# Patient Record
Sex: Male | Born: 1935 | Race: White | Hispanic: No | Smoking: Former smoker
Health system: Southern US, Community
[De-identification: ages and names within clinical notes are randomized; demographics above are authoritative.]

## PROBLEM LIST (undated history)

## (undated) DIAGNOSIS — C439 Malignant melanoma of skin, unspecified: Secondary | ICD-10-CM

## (undated) DIAGNOSIS — B029 Zoster without complications: Secondary | ICD-10-CM

## (undated) DIAGNOSIS — I639 Cerebral infarction, unspecified: Secondary | ICD-10-CM

## (undated) DIAGNOSIS — R531 Weakness: Secondary | ICD-10-CM

## (undated) DIAGNOSIS — C801 Malignant (primary) neoplasm, unspecified: Secondary | ICD-10-CM

## (undated) DIAGNOSIS — R001 Bradycardia, unspecified: Secondary | ICD-10-CM

## (undated) DIAGNOSIS — G8191 Hemiplegia, unspecified affecting right dominant side: Secondary | ICD-10-CM

## (undated) DIAGNOSIS — K219 Gastro-esophageal reflux disease without esophagitis: Secondary | ICD-10-CM

## (undated) DIAGNOSIS — L570 Actinic keratosis: Secondary | ICD-10-CM

## (undated) DIAGNOSIS — R1310 Dysphagia, unspecified: Secondary | ICD-10-CM

## (undated) DIAGNOSIS — I350 Nonrheumatic aortic (valve) stenosis: Secondary | ICD-10-CM

## (undated) DIAGNOSIS — S72001A Fracture of unspecified part of neck of right femur, initial encounter for closed fracture: Secondary | ICD-10-CM

## (undated) DIAGNOSIS — I480 Paroxysmal atrial fibrillation: Secondary | ICD-10-CM

## (undated) DIAGNOSIS — R233 Spontaneous ecchymoses: Secondary | ICD-10-CM

## (undated) DIAGNOSIS — R269 Unspecified abnormalities of gait and mobility: Secondary | ICD-10-CM

## (undated) DIAGNOSIS — Z460 Encounter for fitting and adjustment of spectacles and contact lenses: Secondary | ICD-10-CM

## (undated) DIAGNOSIS — Z85820 Personal history of malignant melanoma of skin: Secondary | ICD-10-CM

## (undated) DIAGNOSIS — I679 Cerebrovascular disease, unspecified: Secondary | ICD-10-CM

## (undated) DIAGNOSIS — H9191 Unspecified hearing loss, right ear: Secondary | ICD-10-CM

## (undated) DIAGNOSIS — E43 Unspecified severe protein-calorie malnutrition: Secondary | ICD-10-CM

## (undated) DIAGNOSIS — H5440 Blindness, one eye, unspecified eye: Secondary | ICD-10-CM

## (undated) DIAGNOSIS — I739 Peripheral vascular disease, unspecified: Secondary | ICD-10-CM

## (undated) DIAGNOSIS — E785 Hyperlipidemia, unspecified: Secondary | ICD-10-CM

## (undated) DIAGNOSIS — I10 Essential (primary) hypertension: Secondary | ICD-10-CM

## (undated) DIAGNOSIS — R748 Abnormal levels of other serum enzymes: Secondary | ICD-10-CM

## (undated) DIAGNOSIS — I472 Ventricular tachycardia, unspecified: Secondary | ICD-10-CM

## (undated) DIAGNOSIS — D649 Anemia, unspecified: Secondary | ICD-10-CM

## (undated) DIAGNOSIS — K5790 Diverticulosis of intestine, part unspecified, without perforation or abscess without bleeding: Secondary | ICD-10-CM

## (undated) DIAGNOSIS — Z9181 History of falling: Secondary | ICD-10-CM

## (undated) DIAGNOSIS — G4733 Obstructive sleep apnea (adult) (pediatric): Secondary | ICD-10-CM

## (undated) DIAGNOSIS — J449 Chronic obstructive pulmonary disease, unspecified: Secondary | ICD-10-CM

## (undated) DIAGNOSIS — E507 Other ocular manifestations of vitamin A deficiency: Secondary | ICD-10-CM

## (undated) DIAGNOSIS — K8051 Calculus of bile duct without cholangitis or cholecystitis with obstruction: Secondary | ICD-10-CM

## (undated) DIAGNOSIS — Z7901 Long term (current) use of anticoagulants: Secondary | ICD-10-CM

## (undated) DIAGNOSIS — L821 Other seborrheic keratosis: Secondary | ICD-10-CM

## (undated) DIAGNOSIS — M7989 Other specified soft tissue disorders: Secondary | ICD-10-CM

## (undated) DIAGNOSIS — R739 Hyperglycemia, unspecified: Secondary | ICD-10-CM

## (undated) DIAGNOSIS — R238 Other skin changes: Secondary | ICD-10-CM

## (undated) HISTORY — DX: Dysphagia, unspecified: R13.10

## (undated) HISTORY — DX: Spontaneous ecchymoses: R23.3

## (undated) HISTORY — DX: Bradycardia, unspecified: R00.1

## (undated) HISTORY — DX: Hyperlipidemia, unspecified: E78.5

## (undated) HISTORY — DX: Cerebrovascular disease, unspecified: I67.9

## (undated) HISTORY — DX: Actinic keratosis: L57.0

## (undated) HISTORY — DX: Chronic obstructive pulmonary disease, unspecified: J44.9

## (undated) HISTORY — DX: Fracture of unspecified part of neck of right femur, initial encounter for closed fracture: S72.001A

## (undated) HISTORY — DX: Other specified soft tissue disorders: M79.89

## (undated) HISTORY — DX: Nonrheumatic aortic (valve) stenosis: I35.0

## (undated) HISTORY — DX: Essential (primary) hypertension: I10

## (undated) HISTORY — DX: Malignant melanoma of skin, unspecified: C43.9

## (undated) HISTORY — DX: Gastro-esophageal reflux disease without esophagitis: K21.9

## (undated) HISTORY — DX: Long term (current) use of anticoagulants: Z79.01

## (undated) HISTORY — DX: Personal history of malignant melanoma of skin: Z85.820

## (undated) HISTORY — DX: Paroxysmal atrial fibrillation: I48.0

## (undated) HISTORY — DX: Other ocular manifestations of vitamin A deficiency: E50.7

## (undated) HISTORY — DX: Anemia, unspecified: D64.9

## (undated) HISTORY — DX: Other seborrheic keratosis: L82.1

## (undated) HISTORY — DX: Calculus of bile duct without cholangitis or cholecystitis with obstruction: K80.51

## (undated) HISTORY — DX: Encounter for fitting and adjustment of spectacles and contact lenses: Z46.0

## (undated) HISTORY — DX: Other skin changes: R23.8

## (undated) HISTORY — DX: Hemiplegia, unspecified affecting right dominant side: G81.91

## (undated) HISTORY — DX: Ventricular tachycardia: I47.2

## (undated) HISTORY — DX: Peripheral vascular disease, unspecified: I73.9

## (undated) HISTORY — DX: Abnormal levels of other serum enzymes: R74.8

## (undated) HISTORY — DX: Malignant (primary) neoplasm, unspecified: C80.1

## (undated) HISTORY — DX: Weakness: R53.1

## (undated) HISTORY — DX: Zoster without complications: B02.9

## (undated) HISTORY — DX: Unspecified abnormalities of gait and mobility: R26.9

## (undated) HISTORY — DX: History of falling: Z91.81

## (undated) HISTORY — DX: Ventricular tachycardia, unspecified: I47.20

## (undated) HISTORY — PX: MELANOMA EXCISION: SHX5266

## (undated) HISTORY — DX: Obstructive sleep apnea (adult) (pediatric): G47.33

## (undated) HISTORY — DX: Unspecified hearing loss, right ear: H91.91

## (undated) HISTORY — DX: Unspecified severe protein-calorie malnutrition: E43

## (undated) HISTORY — DX: Cerebral infarction, unspecified: I63.9

## (undated) HISTORY — DX: Hyperglycemia, unspecified: R73.9

## (undated) HISTORY — DX: Diverticulosis of intestine, part unspecified, without perforation or abscess without bleeding: K57.90

---

## 1969-08-06 HISTORY — PX: OTHER SURGICAL HISTORY: SHX169

## 1979-08-07 DIAGNOSIS — H5440 Blindness, one eye, unspecified eye: Secondary | ICD-10-CM

## 1979-08-07 HISTORY — DX: Blindness, one eye, unspecified eye: H54.40

## 1981-12-06 HISTORY — PX: HERNIA REPAIR: SHX51

## 1998-04-17 ENCOUNTER — Ambulatory Visit (HOSPITAL_COMMUNITY): Admission: RE | Admit: 1998-04-17 | Discharge: 1998-04-17 | Payer: Self-pay | Admitting: Plastic Surgery

## 1998-07-08 ENCOUNTER — Ambulatory Visit (HOSPITAL_COMMUNITY): Admission: RE | Admit: 1998-07-08 | Discharge: 1998-07-08 | Payer: Self-pay | Admitting: Internal Medicine

## 1998-07-23 ENCOUNTER — Inpatient Hospital Stay (HOSPITAL_COMMUNITY): Admission: EM | Admit: 1998-07-23 | Discharge: 1998-07-29 | Payer: Self-pay | Admitting: Emergency Medicine

## 1998-07-29 ENCOUNTER — Inpatient Hospital Stay (HOSPITAL_COMMUNITY)
Admission: RE | Admit: 1998-07-29 | Discharge: 1998-08-19 | Payer: Self-pay | Admitting: Physical Medicine & Rehabilitation

## 1998-07-31 ENCOUNTER — Encounter: Payer: Self-pay | Admitting: Physical Medicine & Rehabilitation

## 1998-11-05 ENCOUNTER — Ambulatory Visit (HOSPITAL_COMMUNITY): Admission: RE | Admit: 1998-11-05 | Discharge: 1998-11-05 | Payer: Self-pay | Admitting: Internal Medicine

## 1998-11-05 ENCOUNTER — Encounter: Payer: Self-pay | Admitting: Internal Medicine

## 1999-03-25 ENCOUNTER — Ambulatory Visit: Admission: RE | Admit: 1999-03-25 | Discharge: 1999-03-25 | Payer: Self-pay | Admitting: Pulmonary Disease

## 1999-04-13 ENCOUNTER — Encounter: Admission: RE | Admit: 1999-04-13 | Discharge: 1999-06-29 | Payer: Self-pay | Admitting: Internal Medicine

## 1999-11-19 ENCOUNTER — Encounter: Admission: RE | Admit: 1999-11-19 | Discharge: 2000-02-17 | Payer: Self-pay

## 1999-12-23 ENCOUNTER — Inpatient Hospital Stay (HOSPITAL_COMMUNITY): Admission: EM | Admit: 1999-12-23 | Discharge: 1999-12-25 | Payer: Self-pay | Admitting: Emergency Medicine

## 1999-12-24 ENCOUNTER — Encounter: Payer: Self-pay | Admitting: Pulmonary Disease

## 2003-04-24 ENCOUNTER — Ambulatory Visit (HOSPITAL_COMMUNITY): Admission: RE | Admit: 2003-04-24 | Discharge: 2003-04-24 | Payer: Self-pay | Admitting: Gastroenterology

## 2003-04-24 ENCOUNTER — Encounter: Payer: Self-pay | Admitting: Gastroenterology

## 2003-05-29 ENCOUNTER — Encounter: Payer: Self-pay | Admitting: Gastroenterology

## 2003-10-08 ENCOUNTER — Encounter: Admission: RE | Admit: 2003-10-08 | Discharge: 2004-01-06 | Payer: Self-pay | Admitting: Neurology

## 2004-10-16 ENCOUNTER — Ambulatory Visit: Payer: Self-pay | Admitting: Internal Medicine

## 2004-10-28 ENCOUNTER — Ambulatory Visit: Payer: Self-pay | Admitting: *Deleted

## 2004-11-04 ENCOUNTER — Ambulatory Visit: Payer: Self-pay | Admitting: Cardiology

## 2004-11-16 ENCOUNTER — Ambulatory Visit: Payer: Self-pay | Admitting: Internal Medicine

## 2004-11-24 ENCOUNTER — Ambulatory Visit: Payer: Self-pay

## 2004-12-11 ENCOUNTER — Ambulatory Visit: Payer: Self-pay | Admitting: Internal Medicine

## 2004-12-28 ENCOUNTER — Ambulatory Visit: Payer: Self-pay | Admitting: Internal Medicine

## 2005-01-01 ENCOUNTER — Ambulatory Visit: Payer: Self-pay | Admitting: Internal Medicine

## 2005-01-12 ENCOUNTER — Ambulatory Visit: Payer: Self-pay | Admitting: Cardiology

## 2005-02-02 ENCOUNTER — Ambulatory Visit: Payer: Self-pay | Admitting: *Deleted

## 2005-02-22 ENCOUNTER — Emergency Department (HOSPITAL_COMMUNITY): Admission: EM | Admit: 2005-02-22 | Discharge: 2005-02-22 | Payer: Self-pay | Admitting: *Deleted

## 2005-02-25 ENCOUNTER — Ambulatory Visit: Payer: Self-pay | Admitting: Internal Medicine

## 2005-03-02 ENCOUNTER — Ambulatory Visit: Payer: Self-pay | Admitting: Cardiology

## 2005-03-30 ENCOUNTER — Ambulatory Visit: Payer: Self-pay | Admitting: Internal Medicine

## 2005-03-30 ENCOUNTER — Ambulatory Visit: Payer: Self-pay | Admitting: *Deleted

## 2005-04-02 ENCOUNTER — Ambulatory Visit (HOSPITAL_COMMUNITY): Admission: RE | Admit: 2005-04-02 | Discharge: 2005-04-02 | Payer: Self-pay | Admitting: Internal Medicine

## 2005-04-27 ENCOUNTER — Ambulatory Visit: Payer: Self-pay | Admitting: Internal Medicine

## 2005-05-11 ENCOUNTER — Ambulatory Visit: Payer: Self-pay | Admitting: Internal Medicine

## 2005-05-17 ENCOUNTER — Ambulatory Visit: Payer: Self-pay | Admitting: Internal Medicine

## 2005-05-25 ENCOUNTER — Ambulatory Visit: Payer: Self-pay

## 2005-05-25 ENCOUNTER — Ambulatory Visit: Payer: Self-pay | Admitting: Cardiology

## 2005-06-22 ENCOUNTER — Ambulatory Visit: Payer: Self-pay | Admitting: Cardiology

## 2005-07-02 ENCOUNTER — Ambulatory Visit: Payer: Self-pay | Admitting: Internal Medicine

## 2005-07-20 ENCOUNTER — Ambulatory Visit: Payer: Self-pay | Admitting: Cardiology

## 2005-08-17 ENCOUNTER — Ambulatory Visit: Payer: Self-pay | Admitting: *Deleted

## 2005-09-14 ENCOUNTER — Ambulatory Visit: Payer: Self-pay | Admitting: Cardiovascular Disease

## 2005-09-16 ENCOUNTER — Ambulatory Visit: Payer: Self-pay | Admitting: Internal Medicine

## 2005-10-05 ENCOUNTER — Ambulatory Visit: Payer: Self-pay | Admitting: Internal Medicine

## 2005-10-12 ENCOUNTER — Ambulatory Visit: Payer: Self-pay | Admitting: Cardiology

## 2005-11-09 ENCOUNTER — Ambulatory Visit: Payer: Self-pay | Admitting: Cardiology

## 2005-12-02 ENCOUNTER — Ambulatory Visit: Payer: Self-pay | Admitting: Internal Medicine

## 2005-12-07 ENCOUNTER — Ambulatory Visit: Payer: Self-pay | Admitting: *Deleted

## 2006-01-04 ENCOUNTER — Ambulatory Visit: Payer: Self-pay | Admitting: Cardiology

## 2006-02-01 ENCOUNTER — Ambulatory Visit: Payer: Self-pay | Admitting: *Deleted

## 2006-02-02 ENCOUNTER — Ambulatory Visit: Payer: Self-pay | Admitting: Internal Medicine

## 2006-03-01 ENCOUNTER — Ambulatory Visit: Payer: Self-pay | Admitting: Cardiology

## 2006-03-29 ENCOUNTER — Ambulatory Visit: Payer: Self-pay | Admitting: Cardiovascular Disease

## 2006-04-01 ENCOUNTER — Ambulatory Visit: Payer: Self-pay | Admitting: Internal Medicine

## 2006-04-26 ENCOUNTER — Ambulatory Visit: Payer: Self-pay | Admitting: *Deleted

## 2006-05-24 ENCOUNTER — Ambulatory Visit: Payer: Self-pay | Admitting: Cardiology

## 2006-06-09 ENCOUNTER — Ambulatory Visit: Payer: Self-pay | Admitting: Cardiology

## 2006-06-30 ENCOUNTER — Ambulatory Visit: Payer: Self-pay | Admitting: Internal Medicine

## 2006-07-14 ENCOUNTER — Ambulatory Visit: Payer: Self-pay | Admitting: Cardiology

## 2006-07-28 ENCOUNTER — Ambulatory Visit: Payer: Self-pay | Admitting: Cardiology

## 2006-08-18 ENCOUNTER — Ambulatory Visit: Payer: Self-pay | Admitting: Cardiology

## 2006-09-15 ENCOUNTER — Ambulatory Visit: Payer: Self-pay | Admitting: Cardiology

## 2006-09-29 ENCOUNTER — Ambulatory Visit: Payer: Self-pay | Admitting: Cardiology

## 2006-10-20 ENCOUNTER — Ambulatory Visit: Payer: Self-pay | Admitting: Cardiology

## 2006-11-04 ENCOUNTER — Ambulatory Visit: Payer: Self-pay | Admitting: Internal Medicine

## 2006-11-16 ENCOUNTER — Ambulatory Visit: Payer: Self-pay | Admitting: Internal Medicine

## 2006-11-17 ENCOUNTER — Ambulatory Visit: Payer: Self-pay | Admitting: Cardiology

## 2006-11-22 ENCOUNTER — Ambulatory Visit: Payer: Self-pay | Admitting: Internal Medicine

## 2006-11-22 ENCOUNTER — Ambulatory Visit: Payer: Self-pay | Admitting: Cardiology

## 2006-11-28 ENCOUNTER — Ambulatory Visit: Payer: Self-pay | Admitting: Adult Health

## 2006-12-02 ENCOUNTER — Ambulatory Visit: Payer: Self-pay | Admitting: Cardiology

## 2006-12-13 ENCOUNTER — Ambulatory Visit: Payer: Self-pay | Admitting: Cardiology

## 2006-12-27 ENCOUNTER — Ambulatory Visit: Payer: Self-pay | Admitting: *Deleted

## 2006-12-27 ENCOUNTER — Ambulatory Visit: Payer: Self-pay | Admitting: Internal Medicine

## 2007-01-03 ENCOUNTER — Ambulatory Visit: Payer: Self-pay | Admitting: Cardiology

## 2007-01-09 ENCOUNTER — Ambulatory Visit: Payer: Self-pay | Admitting: Internal Medicine

## 2007-01-09 LAB — CONVERTED CEMR LAB: Sed Rate: 20 mm/hr (ref 0–20)

## 2007-01-11 ENCOUNTER — Ambulatory Visit: Payer: Self-pay | Admitting: Cardiology

## 2007-01-25 ENCOUNTER — Ambulatory Visit (HOSPITAL_COMMUNITY): Admission: RE | Admit: 2007-01-25 | Discharge: 2007-01-25 | Payer: Self-pay | Admitting: Surgery

## 2007-01-25 ENCOUNTER — Encounter (INDEPENDENT_AMBULATORY_CARE_PROVIDER_SITE_OTHER): Payer: Self-pay | Admitting: Specialist

## 2007-02-01 ENCOUNTER — Ambulatory Visit: Payer: Self-pay | Admitting: Internal Medicine

## 2007-02-08 ENCOUNTER — Ambulatory Visit: Payer: Self-pay | Admitting: *Deleted

## 2007-02-10 ENCOUNTER — Ambulatory Visit: Payer: Self-pay | Admitting: Internal Medicine

## 2007-02-22 ENCOUNTER — Ambulatory Visit: Payer: Self-pay | Admitting: Cardiovascular Disease

## 2007-03-06 ENCOUNTER — Ambulatory Visit: Payer: Self-pay | Admitting: Internal Medicine

## 2007-03-15 ENCOUNTER — Ambulatory Visit: Payer: Self-pay | Admitting: Internal Medicine

## 2007-04-12 ENCOUNTER — Ambulatory Visit: Payer: Self-pay | Admitting: *Deleted

## 2007-04-25 ENCOUNTER — Ambulatory Visit: Payer: Self-pay | Admitting: Internal Medicine

## 2007-04-26 ENCOUNTER — Ambulatory Visit: Payer: Self-pay | Admitting: Internal Medicine

## 2007-04-26 LAB — CONVERTED CEMR LAB
Basophils Absolute: 0 10*3/uL (ref 0.0–0.1)
Bilirubin, Direct: 0.1 mg/dL (ref 0.0–0.3)
CO2: 30 meq/L (ref 19–32)
CRP, High Sensitivity: 1 (ref 0.00–5.00)
Calcium: 8.9 mg/dL (ref 8.4–10.5)
Chloride: 105 meq/L (ref 96–112)
Glucose, Bld: 93 mg/dL (ref 70–99)
Hemoglobin: 14.3 g/dL (ref 13.0–17.0)
MCHC: 34.4 g/dL (ref 30.0–36.0)
Monocytes Relative: 6.9 % (ref 3.0–11.0)
Potassium: 5.2 meq/L — ABNORMAL HIGH (ref 3.5–5.1)
RBC: 4.56 M/uL (ref 4.22–5.81)
Sed Rate: 21 mm/hr — ABNORMAL HIGH (ref 0–20)
Sodium: 140 meq/L (ref 135–145)
TSH: 1.07 microintl units/mL (ref 0.35–5.50)
Total CHOL/HDL Ratio: 6.3
Triglycerides: 228 mg/dL (ref 0–149)
VLDL: 46 mg/dL — ABNORMAL HIGH (ref 0–40)

## 2007-05-10 ENCOUNTER — Ambulatory Visit: Payer: Self-pay | Admitting: Cardiology

## 2007-05-22 ENCOUNTER — Ambulatory Visit: Payer: Self-pay | Admitting: Internal Medicine

## 2007-08-21 ENCOUNTER — Ambulatory Visit: Payer: Self-pay | Admitting: Internal Medicine

## 2007-09-18 ENCOUNTER — Ambulatory Visit: Payer: Self-pay | Admitting: Internal Medicine

## 2007-09-21 DIAGNOSIS — R05 Cough: Secondary | ICD-10-CM

## 2007-09-21 DIAGNOSIS — I10 Essential (primary) hypertension: Secondary | ICD-10-CM

## 2007-09-21 DIAGNOSIS — K5730 Diverticulosis of large intestine without perforation or abscess without bleeding: Secondary | ICD-10-CM | POA: Insufficient documentation

## 2007-09-21 DIAGNOSIS — I495 Sick sinus syndrome: Secondary | ICD-10-CM

## 2007-09-21 DIAGNOSIS — H5440 Blindness, one eye, unspecified eye: Secondary | ICD-10-CM

## 2007-09-21 DIAGNOSIS — I739 Peripheral vascular disease, unspecified: Secondary | ICD-10-CM

## 2007-09-21 DIAGNOSIS — E785 Hyperlipidemia, unspecified: Secondary | ICD-10-CM

## 2007-11-20 ENCOUNTER — Ambulatory Visit: Payer: Self-pay | Admitting: Internal Medicine

## 2007-11-20 LAB — CONVERTED CEMR LAB
CO2: 31 meq/L (ref 19–32)
Chloride: 100 meq/L (ref 96–112)
GFR calc non Af Amer: 67 mL/min
Glucose, Bld: 83 mg/dL (ref 70–99)
Sodium: 137 meq/L (ref 135–145)

## 2007-12-07 DIAGNOSIS — C439 Malignant melanoma of skin, unspecified: Secondary | ICD-10-CM

## 2007-12-07 HISTORY — DX: Malignant melanoma of skin, unspecified: C43.9

## 2007-12-07 HISTORY — PX: OTHER SURGICAL HISTORY: SHX169

## 2007-12-11 ENCOUNTER — Encounter: Payer: Self-pay | Admitting: Internal Medicine

## 2008-03-19 ENCOUNTER — Ambulatory Visit: Payer: Self-pay | Admitting: Internal Medicine

## 2008-03-19 DIAGNOSIS — J209 Acute bronchitis, unspecified: Secondary | ICD-10-CM

## 2008-04-19 ENCOUNTER — Ambulatory Visit: Payer: Self-pay | Admitting: Internal Medicine

## 2008-04-19 DIAGNOSIS — Z85820 Personal history of malignant melanoma of skin: Secondary | ICD-10-CM

## 2008-04-22 LAB — CONVERTED CEMR LAB
CO2: 30 meq/L (ref 19–32)
Chloride: 105 meq/L (ref 96–112)
Creatinine, Ser: 1.2 mg/dL (ref 0.4–1.5)
GFR calc Af Amer: 74 mL/min
GFR calc non Af Amer: 61 mL/min
Potassium: 4.8 meq/L (ref 3.5–5.1)
Sodium: 137 meq/L (ref 135–145)

## 2008-04-23 ENCOUNTER — Encounter: Payer: Self-pay | Admitting: Internal Medicine

## 2008-06-26 ENCOUNTER — Ambulatory Visit (HOSPITAL_COMMUNITY): Admission: RE | Admit: 2008-06-26 | Discharge: 2008-06-26 | Payer: Self-pay | Admitting: Surgery

## 2008-07-24 ENCOUNTER — Ambulatory Visit: Payer: Self-pay | Admitting: Cardiology

## 2008-08-13 ENCOUNTER — Telehealth (INDEPENDENT_AMBULATORY_CARE_PROVIDER_SITE_OTHER): Payer: Self-pay | Admitting: *Deleted

## 2008-08-14 ENCOUNTER — Telehealth: Payer: Self-pay | Admitting: Gastroenterology

## 2008-08-14 DIAGNOSIS — R197 Diarrhea, unspecified: Secondary | ICD-10-CM

## 2008-08-19 ENCOUNTER — Ambulatory Visit: Payer: Self-pay | Admitting: Gastroenterology

## 2008-08-19 DIAGNOSIS — K219 Gastro-esophageal reflux disease without esophagitis: Secondary | ICD-10-CM

## 2008-08-19 DIAGNOSIS — R197 Diarrhea, unspecified: Secondary | ICD-10-CM

## 2008-08-26 ENCOUNTER — Ambulatory Visit: Payer: Self-pay | Admitting: Internal Medicine

## 2008-08-26 DIAGNOSIS — R609 Edema, unspecified: Secondary | ICD-10-CM | POA: Insufficient documentation

## 2008-08-29 ENCOUNTER — Telehealth (INDEPENDENT_AMBULATORY_CARE_PROVIDER_SITE_OTHER): Payer: Self-pay | Admitting: *Deleted

## 2008-08-29 LAB — CONVERTED CEMR LAB
BUN: 31 mg/dL — ABNORMAL HIGH (ref 6–23)
CO2: 25 meq/L (ref 19–32)
Calcium: 8.8 mg/dL (ref 8.4–10.5)
Chloride: 111 meq/L (ref 96–112)
Creatinine, Ser: 1.4 mg/dL (ref 0.4–1.5)
GFR calc non Af Amer: 51 mL/min
Potassium: 4.9 meq/L (ref 3.5–5.1)
Sodium: 140 meq/L (ref 135–145)
Total Bilirubin: 0.6 mg/dL (ref 0.3–1.2)

## 2008-09-05 ENCOUNTER — Encounter (INDEPENDENT_AMBULATORY_CARE_PROVIDER_SITE_OTHER): Payer: Self-pay | Admitting: Surgery

## 2008-09-05 ENCOUNTER — Inpatient Hospital Stay (HOSPITAL_COMMUNITY): Admission: RE | Admit: 2008-09-05 | Discharge: 2008-09-06 | Payer: Self-pay | Admitting: Surgery

## 2008-09-16 ENCOUNTER — Encounter: Payer: Self-pay | Admitting: Internal Medicine

## 2008-09-19 ENCOUNTER — Encounter: Payer: Self-pay | Admitting: Internal Medicine

## 2008-09-25 ENCOUNTER — Encounter: Payer: Self-pay | Admitting: Internal Medicine

## 2008-10-01 ENCOUNTER — Encounter: Payer: Self-pay | Admitting: Internal Medicine

## 2008-10-14 ENCOUNTER — Ambulatory Visit: Payer: Self-pay | Admitting: Internal Medicine

## 2008-10-14 LAB — CONVERTED CEMR LAB
ALT: 16 units/L (ref 0–53)
AST: 18 units/L (ref 0–37)
Alkaline Phosphatase: 60 units/L (ref 39–117)
CO2: 27 meq/L (ref 19–32)
Creatinine, Ser: 1.4 mg/dL (ref 0.4–1.5)
GFR calc Af Amer: 62 mL/min
GFR calc non Af Amer: 51 mL/min
Glucose, Bld: 97 mg/dL (ref 70–99)

## 2008-10-23 ENCOUNTER — Encounter: Payer: Self-pay | Admitting: Internal Medicine

## 2008-10-28 ENCOUNTER — Telehealth (INDEPENDENT_AMBULATORY_CARE_PROVIDER_SITE_OTHER): Payer: Self-pay | Admitting: *Deleted

## 2009-04-01 ENCOUNTER — Encounter: Payer: Self-pay | Admitting: Cardiology

## 2009-05-06 ENCOUNTER — Encounter: Payer: Self-pay | Admitting: *Deleted

## 2009-05-20 ENCOUNTER — Encounter: Payer: Self-pay | Admitting: Internal Medicine

## 2009-05-20 ENCOUNTER — Encounter (INDEPENDENT_AMBULATORY_CARE_PROVIDER_SITE_OTHER): Payer: Self-pay | Admitting: *Deleted

## 2009-06-11 ENCOUNTER — Encounter: Payer: Self-pay | Admitting: *Deleted

## 2009-06-17 ENCOUNTER — Encounter: Payer: Self-pay | Admitting: Cardiology

## 2009-06-17 ENCOUNTER — Encounter (INDEPENDENT_AMBULATORY_CARE_PROVIDER_SITE_OTHER): Payer: Self-pay | Admitting: Cardiology

## 2009-07-01 ENCOUNTER — Encounter: Payer: Self-pay | Admitting: Internal Medicine

## 2009-07-01 ENCOUNTER — Encounter: Payer: Self-pay | Admitting: Cardiology

## 2009-07-01 LAB — CONVERTED CEMR LAB: POC INR: 2.8

## 2009-07-22 ENCOUNTER — Encounter: Payer: Self-pay | Admitting: Cardiology

## 2009-07-22 ENCOUNTER — Encounter: Payer: Self-pay | Admitting: Cardiovascular Disease

## 2009-08-12 ENCOUNTER — Telehealth (INDEPENDENT_AMBULATORY_CARE_PROVIDER_SITE_OTHER): Payer: Self-pay | Admitting: *Deleted

## 2009-08-19 ENCOUNTER — Encounter: Payer: Self-pay | Admitting: Cardiology

## 2009-08-19 ENCOUNTER — Encounter (INDEPENDENT_AMBULATORY_CARE_PROVIDER_SITE_OTHER): Payer: Self-pay | Admitting: Cardiology

## 2009-08-19 LAB — CONVERTED CEMR LAB: POC INR: 4

## 2009-09-02 ENCOUNTER — Encounter: Payer: Self-pay | Admitting: Cardiology

## 2009-09-23 ENCOUNTER — Encounter: Payer: Self-pay | Admitting: Cardiology

## 2009-10-14 ENCOUNTER — Encounter: Payer: Self-pay | Admitting: Cardiology

## 2009-11-07 ENCOUNTER — Telehealth: Payer: Self-pay | Admitting: Internal Medicine

## 2009-11-11 ENCOUNTER — Encounter: Payer: Self-pay | Admitting: Cardiovascular Disease

## 2009-11-11 ENCOUNTER — Encounter (INDEPENDENT_AMBULATORY_CARE_PROVIDER_SITE_OTHER): Payer: Self-pay | Admitting: Cardiology

## 2009-11-11 LAB — CONVERTED CEMR LAB: POC INR: 2.6

## 2009-11-12 ENCOUNTER — Ambulatory Visit: Payer: Self-pay | Admitting: Internal Medicine

## 2009-11-13 ENCOUNTER — Telehealth: Payer: Self-pay | Admitting: Internal Medicine

## 2009-11-17 LAB — CONVERTED CEMR LAB
ALT: 18 units/L (ref 0–53)
AST: 19 units/L (ref 0–37)
Alkaline Phosphatase: 72 units/L (ref 39–117)
BUN: 27 mg/dL — ABNORMAL HIGH (ref 6–23)
Basophils Absolute: 0 10*3/uL (ref 0.0–0.1)
Bilirubin, Direct: 0 mg/dL (ref 0.0–0.3)
Calcium: 8.9 mg/dL (ref 8.4–10.5)
Chloride: 105 meq/L (ref 96–112)
Lymphs Abs: 2.1 10*3/uL (ref 0.7–4.0)
MCHC: 33.4 g/dL (ref 30.0–36.0)
Monocytes Relative: 10.6 % (ref 3.0–12.0)
Platelets: 290 10*3/uL (ref 150.0–400.0)
RDW: 12.2 % (ref 11.5–14.6)
Sodium: 141 meq/L (ref 135–145)
Total Bilirubin: 0.8 mg/dL (ref 0.3–1.2)
Total Protein: 6.8 g/dL (ref 6.0–8.3)
WBC: 7.6 10*3/uL (ref 4.5–10.5)

## 2009-12-09 ENCOUNTER — Encounter: Payer: Self-pay | Admitting: Cardiology

## 2009-12-10 ENCOUNTER — Encounter: Payer: Self-pay | Admitting: Cardiology

## 2010-01-06 ENCOUNTER — Encounter: Payer: Self-pay | Admitting: Internal Medicine

## 2010-02-03 ENCOUNTER — Encounter: Payer: Self-pay | Admitting: Cardiovascular Disease

## 2010-02-03 LAB — CONVERTED CEMR LAB: Prothrombin Time: 22.1 s

## 2010-02-10 ENCOUNTER — Encounter: Payer: Self-pay | Admitting: Cardiology

## 2010-03-02 ENCOUNTER — Ambulatory Visit: Payer: Self-pay | Admitting: Internal Medicine

## 2010-03-02 ENCOUNTER — Telehealth: Payer: Self-pay | Admitting: Internal Medicine

## 2010-03-02 DIAGNOSIS — R635 Abnormal weight gain: Secondary | ICD-10-CM

## 2010-03-02 DIAGNOSIS — J019 Acute sinusitis, unspecified: Secondary | ICD-10-CM

## 2010-03-10 ENCOUNTER — Encounter: Payer: Self-pay | Admitting: Internal Medicine

## 2010-03-13 ENCOUNTER — Telehealth (INDEPENDENT_AMBULATORY_CARE_PROVIDER_SITE_OTHER): Payer: Self-pay | Admitting: *Deleted

## 2010-03-16 ENCOUNTER — Encounter: Payer: Self-pay | Admitting: Internal Medicine

## 2010-03-31 ENCOUNTER — Encounter: Payer: Self-pay | Admitting: Cardiovascular Disease

## 2010-03-31 LAB — CONVERTED CEMR LAB: POC INR: 2

## 2010-04-14 ENCOUNTER — Encounter: Payer: Self-pay | Admitting: Internal Medicine

## 2010-04-21 ENCOUNTER — Encounter: Payer: Self-pay | Admitting: Internal Medicine

## 2010-04-21 LAB — CONVERTED CEMR LAB: Prothrombin Time: 23.5 s

## 2010-05-06 ENCOUNTER — Encounter: Payer: Self-pay | Admitting: Internal Medicine

## 2010-05-13 ENCOUNTER — Ambulatory Visit: Payer: Self-pay | Admitting: Internal Medicine

## 2010-05-15 ENCOUNTER — Encounter: Payer: Self-pay | Admitting: Internal Medicine

## 2010-05-19 ENCOUNTER — Encounter: Payer: Self-pay | Admitting: Cardiovascular Disease

## 2010-05-19 LAB — CONVERTED CEMR LAB: POC INR: 3.1

## 2010-06-02 ENCOUNTER — Ambulatory Visit: Payer: Self-pay | Admitting: Internal Medicine

## 2010-06-03 LAB — CONVERTED CEMR LAB
BUN: 31 mg/dL — ABNORMAL HIGH (ref 6–23)
Basophils Relative: 0.2 % (ref 0.0–3.0)
CO2: 28 meq/L (ref 19–32)
Calcium: 8.6 mg/dL (ref 8.4–10.5)
Chloride: 108 meq/L (ref 96–112)
Creatinine, Ser: 1.4 mg/dL (ref 0.4–1.5)
Eosinophils Relative: 2.1 % (ref 0.0–5.0)
GFR calc non Af Amer: 49.52 mL/min (ref >60)
Glucose, Bld: 113 mg/dL — ABNORMAL HIGH (ref 70–99)
MCV: 92.9 fL (ref 78.0–100.0)
Monocytes Absolute: 0.6 10*3/uL (ref 0.1–1.0)
Platelets: 288 10*3/uL (ref 150.0–400.0)
Potassium: 4.9 meq/L (ref 3.5–5.1)
Sodium: 141 meq/L (ref 135–145)

## 2010-06-10 ENCOUNTER — Encounter: Payer: Self-pay | Admitting: Internal Medicine

## 2010-06-16 ENCOUNTER — Encounter: Payer: Self-pay | Admitting: Cardiovascular Disease

## 2010-06-16 LAB — CONVERTED CEMR LAB: POC INR: 3.1

## 2010-07-14 ENCOUNTER — Encounter: Payer: Self-pay | Admitting: Cardiology

## 2010-07-21 ENCOUNTER — Telehealth (INDEPENDENT_AMBULATORY_CARE_PROVIDER_SITE_OTHER): Payer: Self-pay | Admitting: *Deleted

## 2010-07-22 ENCOUNTER — Ambulatory Visit: Payer: Self-pay | Admitting: Internal Medicine

## 2010-07-22 DIAGNOSIS — IMO0002 Reserved for concepts with insufficient information to code with codable children: Secondary | ICD-10-CM

## 2010-07-28 ENCOUNTER — Telehealth (INDEPENDENT_AMBULATORY_CARE_PROVIDER_SITE_OTHER): Payer: Self-pay | Admitting: *Deleted

## 2010-07-29 ENCOUNTER — Encounter: Payer: Self-pay | Admitting: Adult Health

## 2010-07-29 ENCOUNTER — Ambulatory Visit: Payer: Self-pay | Admitting: Internal Medicine

## 2010-08-04 ENCOUNTER — Encounter: Payer: Self-pay | Admitting: Cardiovascular Disease

## 2010-08-12 ENCOUNTER — Ambulatory Visit: Payer: Self-pay | Admitting: Pulmonary Disease

## 2010-08-13 ENCOUNTER — Telehealth (INDEPENDENT_AMBULATORY_CARE_PROVIDER_SITE_OTHER): Payer: Self-pay | Admitting: *Deleted

## 2010-08-13 DIAGNOSIS — M25569 Pain in unspecified knee: Secondary | ICD-10-CM

## 2010-08-14 ENCOUNTER — Encounter: Payer: Self-pay | Admitting: Internal Medicine

## 2010-08-18 ENCOUNTER — Encounter: Payer: Self-pay | Admitting: Cardiovascular Disease

## 2010-08-18 LAB — CONVERTED CEMR LAB: POC INR: 2.7

## 2010-08-25 ENCOUNTER — Ambulatory Visit: Payer: Self-pay | Admitting: Internal Medicine

## 2010-08-31 ENCOUNTER — Encounter: Payer: Self-pay | Admitting: Cardiovascular Disease

## 2010-09-07 ENCOUNTER — Encounter: Payer: Self-pay | Admitting: Internal Medicine

## 2010-09-15 ENCOUNTER — Encounter: Payer: Self-pay | Admitting: Cardiology

## 2010-09-15 ENCOUNTER — Encounter: Payer: Self-pay | Admitting: Internal Medicine

## 2010-10-05 ENCOUNTER — Encounter: Payer: Self-pay | Admitting: Cardiology

## 2010-10-14 ENCOUNTER — Encounter: Payer: Self-pay | Admitting: Internal Medicine

## 2010-10-26 ENCOUNTER — Encounter: Payer: Self-pay | Admitting: Cardiovascular Disease

## 2010-10-26 LAB — CONVERTED CEMR LAB: Prothrombin Time: 33.7 s

## 2010-10-28 ENCOUNTER — Ambulatory Visit: Payer: Self-pay | Admitting: Internal Medicine

## 2010-10-28 DIAGNOSIS — N62 Hypertrophy of breast: Secondary | ICD-10-CM

## 2010-11-09 ENCOUNTER — Encounter: Payer: Self-pay | Admitting: Internal Medicine

## 2010-11-23 ENCOUNTER — Encounter: Payer: Self-pay | Admitting: Internal Medicine

## 2010-11-23 LAB — CONVERTED CEMR LAB: Prothrombin Time: 26.3 s

## 2010-12-02 ENCOUNTER — Ambulatory Visit: Payer: Self-pay | Admitting: Internal Medicine

## 2010-12-06 HISTORY — PX: JOINT REPLACEMENT: SHX530

## 2010-12-21 ENCOUNTER — Encounter: Payer: Self-pay | Admitting: Internal Medicine

## 2010-12-21 ENCOUNTER — Encounter: Payer: Self-pay | Admitting: Cardiology

## 2010-12-30 ENCOUNTER — Encounter: Payer: Self-pay | Admitting: Internal Medicine

## 2011-01-03 LAB — CONVERTED CEMR LAB
POC INR: 2.4
Prothrombin Time: 23.8 s

## 2011-01-04 ENCOUNTER — Encounter: Payer: Self-pay | Admitting: Cardiology

## 2011-01-04 LAB — CONVERTED CEMR LAB: Prothrombin Time: 23.9 s

## 2011-01-05 NOTE — Medication Information (Signed)
Summary: Coumadin Clinic  Anticoagulant Therapy  Managed by: Weston Brass, PharmD Referring MD: [name] PCP: Nolon Lennert MD: Daleen Squibb MD, Maisie Fus Indication 1: CVA-stroke (ICD-436) Lab Used: [facility] [place] Site: [address] INR POC 3.2 INR RANGE 2 - 3  Dietary changes: no    Health status changes: no    Bleeding/hemorrhagic complications: no    Recent/future hospitalizations: no    Any changes in medication regimen? no    Recent/future dental: no  Any missed doses?: no       Is patient compliant with meds? yes       Allergies: No Known Drug Allergies  Anticoagulation Management History:      His anticoagulation is being managed by telephone today.  Positive risk factors for bleeding include an age of 75 years or older.  The bleeding index is 'intermediate risk'.  Positive CHADS2 values include History of HTN and Age > 43 years old.  The start date was 07/06/1998.  Anticoagulation responsible provider: Daleen Squibb MD, Maisie Fus.  INR POC: 3.2.    Anticoagulation Management Assessment/Plan:      The patient's current anticoagulation dose is Coumadin 3 mg tabs: take as directed.  The target INR is 2 - 3.  The next INR is due 08/04/2010.  Anticoagulation instructions were given to patient.  Results were reviewed/authorized by Weston Brass, PharmD.  He was notified by Weston Brass PharmD.         Prior Anticoagulation Instructions: LMOM to call for dose instructions. Bethena Midget, RN, BSN  June 16, 2010 12:16 PM  INR 3.1 Pt to take 3mg s tomorrow then resume 3mg s daily except 6mg s on Wed and Fri. Recheck in 4 weeks. Orders sent to [facility].   Current Anticoagulation Instructions: INR 3.2  Spoke with pt.  Decrease dose to 3mg  every day except 6mg  on Friday. Recheck INR in 3 weeks.  Orders faxed to [facility].

## 2011-01-05 NOTE — Assessment & Plan Note (Signed)
Summary: Primary svc/ try off spirnolactone   Primary Provider/Referring Provider:  [name]  CC:  Left leg pain- the same.  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  He has been maintained on Coumadin since then and mainly complains of intermittent dizziness dating all the way back to his stroke which is controlled on p.r.n. meclizine.  He is followed here also for hypertension and chronic leg swelling. l> r  seen 04/19/08 for fu ok except ? recurrent melanoma left leg  August 26, 2008  cleared for melanoma surgery by Cards   October 14, 2008 ov p skin graft with weaping clear fluid worse as day goes on. no fever or pain or erythema of wound or purulent drainage cp or orhtopnea/ pnd/cp/sob.   May 13, 2010 ov  cc cough off and on for 5 weeks  with white sputum.  He states that he has noticed wheezing for the past few days.  some nasal congestion and sneezing.   swallowing worse lately esp mucinex dm. rec change zantac bedtime, protonix before bfast and diet.  June 02, 2010 cc cough and chest congestion has resolved.  Still has occ cough that his non prod.  He c/o not being able to move his left thumb intermittent cramps up.  Pt is currently on cephalexin 500 mg  for bacterial infection per Tafeen.    August 14 th fell and tore skin R Elbow  July 22, 2010 ov NH nurse concerned that developing cellulitis at abrasion site/ right forearm.  no fever or increase pain, minimal swelling.  --  July 29, 2010--Presents for follow up for right arm cellulitis.  Was seen 1 week ago, tx for for abrasion right forearm, was given keflex - finished this morning.  states no better and has increased warmth x2days. The skin is very thin, nurses are apply a clear dressing but removing it pulls at skin. no fever, confusion, or increased drainage.    August 12, 2010-  cc slow to heal right arm cellulitis, wound cx showed ENTEROBACTER  CLOACAE. Tx w/ Levaquin. Returns today wound is much improved. w/ healed sore and redness/swelling is almost gone. Yesterday hit left elbow w/ small abrasion. no significant redness. complains that his left knee has been hurting . no swelling or redness. Has not used any pain meds. Finish Levaquin.   Alternate ice and Warm heat to knee Tylenol arthritis as needed for pain- go by the bottle instructions.  Elevate and rest  October 28, 2010 ov cc leg pain no change, not using meloxicam as per instructions , also now c/o gynecomastia and tenderness gradually worse x 2 months= bilaterally while on spironoctone chronically which has controlled his edema and tendency to hbp well. no nipple discharge or pain.  Pt denies any significant sore throat, dysphagia, itching, sneezing,  nasal congestion or excess secretions,  fever, chills, sweats, unintended wt loss, pleuritic or exertional cp, hempoptysis, change in activity tolerance  orthopnea pnd . Pt also denies any obvious fluctuation in symptoms with weather or environmental change or other alleviating or aggravating factors.          Current Medications (verified): 1)  Coumadin 3 Mg Tabs (Warfarin Sodium) .... Take As Directed 2)  Zantac 150 Mg  Tabs (Ranitidine Hcl) .... 2 At Bedtime 3)  Prilosec Otc 20 Mg Tbec (Omeprazole Magnesium) .... Take 1 Capsule 30-60 Minutes Before First Meal of The Day. 4)  Spironolactone-Hctz  25-25 Mg Tabs (Spironolactone-Hctz) .... 2 Once Daily 5)  Meclizine Hcl 25 Mg  Tabs (Meclizine Hcl) .... 1/2 To 1 Every 6 Hrs As Needed For Dizzinesss 6)  Mucinex Dm 30-600 Mg Xr12h-Tab (Dextromethorphan-Guaifenesin) .... 2 Two Times A Day As Needed 7)  Meloxicam 7.5 Mg Tabs (Meloxicam) .... One Twice Daily With Meals As Needed For Knee Pain 8)  Tylenol 325 Mg Tabs (Acetaminophen) .... Per Bottle Directions As Needed  Allergies (verified): 1)  ! Sulfa  Past History:  Past Medical History: BLINDNESS, ONE EYE  (ICD-369.60) CVA SINUS BRADYCARDIA (ICD-427.81) DIVERTICULOSIS, COLON (ICD-562.10) Internal hemorrhoids COUGH, CHRONIC (ICD-786.2) PVD (ICD-443.9) HYPERLIPIDEMIA (ICD-272.4) HYPERTENSION (ICD-401.9)    - breast tenderness on aldactone August 25, 2010 > d/c October 29, 2010  Hyperkalemia on aldactone October 14, 2008  > d/c October 28, 2010 due to gynecomastia Melanoma,  skin graft 2003..............................................Marland KitchenTafeen    - repeat skin graft 10/09 COPD OSA on CPAP GERD Health Maintenance ..................................................................Marland KitchenWert   -  Td 4/06   -  Pneumovax 1998  Vital Signs:  Patient profile:   77 year old male Weight:      190 pounds O2 Sat:      94 % on Room air Temp:     98.4 degrees F oral Pulse rate:   60 / minute BP sitting:   120 / 68  (left arm)  Vitals Entered By: Vernie Murders (October 28, 2010 10:47 AM)  O2 Flow:  Room air  Physical Exam  Additional Exam:  very frail amb wm walks with cane  no longer getting up on exam table  wt  169 October 14, 2008  > 191 June 02, 2010 > 193 July 22, 2010  > 193 August 25, 2010 > 190 October 29, 2010  HEENT:  Unremarkable.  Oropharynx is clear. LUNG FIELDS:  Coarse breath sounds w/ no wheezing.  HEART:  There is a regular rhythm without murmur, gallop, rub. No edema ABDOMEN:  Soft, benign. EXTREMITIES:  Warm without calf tenderness,   MS  Minimal crepitance L knee, no effusion, tenderness/ erythema Breasts minimal sym enlargement with no nipple discharge   Impression & Recommendations:  Problem # 1:  GYNECOMASTIA (ICD-611.1) Likely effect of spironolactone, try off and replace with lasix  Problem # 2:  EDEMA (ICD-782.3) May recur without spirololactone and if so try Maxzide.  Problem # 3:  KNEE PAIN (ICD-719.46) not using meloxicam as per instructions  Each maintenance medication was reviewed in detail including most importantly the difference between  maintenance prns and under what circumstances the prns are to be used.  In addition, these two groups (for which the patient should keep up with refills) were distinguished from a third group :  meds that are used only short term with the intent to complete a course of therapy and then not refill them.  The med list was then fully reconciled and reorganized to reflect this important distinction.   Problem # 4:  COUGH, CHRONIC (ICD-786.2) resolved on rx for GERD  Medications Added to Medication List This Visit: 1)  Spironolactone-hctz 25-25 Mg Tabs (Spironolactone-hctz) .... 2 once daily 2)  Lasix 40 Mg Tabs (Furosemide) .... One daily 3)  Tylenol 325 Mg Tabs (Acetaminophen) .... Per bottle directions as needed  Other Orders: Est. Patient Level IV (84132) Prescription Created Electronically [phone])  Patient Instructions: 1)  stop spironolactone 2)  start Furosemide 40 mg one daily 3)  Please schedule a follow-up appointment in 4 weeks, sooner if  needed to see Tammy Prescriptions: LASIX 40 MG  TABS (FUROSEMIDE) one daily  #30 x 11   Entered and Authorized by:   Nyoka Cowden MD   Signed by:   Nyoka Cowden MD on 10/28/2010   Method used:   Electronically to        [facility]* (retail)       [address]       [place], [place]  [id number]       Ph: [phone]       Fax: [phone]   RxID:   [id number]

## 2011-01-05 NOTE — Miscellaneous (Signed)
Summary: OT Treatment Plan/[facility]  OT Treatment Plan/[facility]   Imported By: Lanelle Bal 03/20/2010 11:08:28  _____________________________________________________________________  External Attachment:    Type:   Image     Comment:   External Document

## 2011-01-05 NOTE — Medication Information (Signed)
Summary: Coumadin Clinic  [facility]in Clinic   Imported By: Kassie Mends 02/12/2010 12:36:41  _____________________________________________________________________  External Attachment:    Type:   Image     Comment:   External Document

## 2011-01-05 NOTE — Miscellaneous (Signed)
Summary: Order/[facility]  Order/[facility]   Imported By: Sherian Rein 05/20/2010 08:40:08  _____________________________________________________________________  External Attachment:    Type:   Image     Comment:   External Document

## 2011-01-05 NOTE — Medication Information (Signed)
Summary: Coumadin Clinic  Anticoagulant Therapy  Managed by: Bethena Midget, RN, BSN Referring MD: [name] PCP: Nolon Lennert MD: Graciela Husbands MD, Viviann Spare Indication 1: CVA-stroke (ICD-436) Lab Used: [facility] [place] Site: [address] PT 23.5 INR POC 2.4 INR RANGE 2 - 3  Dietary changes: no    Health status changes: no    Bleeding/hemorrhagic complications: no    Recent/future hospitalizations: no    Any changes in medication regimen? no    Recent/future dental: no  Any missed doses?: no       Is patient compliant with meds? yes       Allergies: No Known Drug Allergies  Anticoagulation Management History:      His anticoagulation is being managed by telephone today.  Positive risk factors for bleeding include an age of 75 years or older.  The bleeding index is 'intermediate risk'.  Positive CHADS2 values include History of HTN and Age > 75 years old.  The start date was 07/06/1998.  Prothrombin time is 23.5.  Anticoagulation responsible provider: Graciela Husbands MD, Viviann Spare.  INR POC: 2.4.    Anticoagulation Management Assessment/Plan:      The patient's current anticoagulation dose is Coumadin 3 mg tabs: take as directed.  The target INR is 2 - 3.  The next INR is due 05/19/2010.  Anticoagulation instructions were given to patient.  Results were reviewed/authorized by Bethena Midget, RN, BSN.  He was notified by Bethena Midget, RN, BSN.         Prior Anticoagulation Instructions: INR 2.0 Today 6mg s then resume 3mg s daily except 6mg s on Wed and Fridays. Recheck in 3 weeks. Orders sent to [facility].   Current Anticoagulation Instructions: INR 2.4 Continue 3mg  daily except 6mg s on Wednesdays and Fridays. Recheck in 4 weeks. Orders sent to [facility].

## 2011-01-05 NOTE — Miscellaneous (Signed)
Summary: Plan of treatment/[facility]  Plan of treatment/[facility]   Imported By: Sherian Rein 04/17/2010 08:58:51  _____________________________________________________________________  External Attachment:    Type:   Image     Comment:   External Document

## 2011-01-05 NOTE — Medication Information (Signed)
Summary: Coumadin Clinic  Anticoagulant Therapy  Managed by: Bethena Midget, RN, BSN Referring MD: [name] PCP: Nolon Lennert MD: Clifton James MD, Cristal Deer Indication 1: CVA-stroke (ICD-436) Lab Used: [facility] [place] Site: [address] INR POC 3.1 INR RANGE 2 - 3  Dietary changes: yes       Details: He will attempt to added more leafy veggies in diet  Health status changes: no    Bleeding/hemorrhagic complications: no    Recent/future hospitalizations: no    Any changes in medication regimen? no    Recent/future dental: no  Any missed doses?: no       Is patient compliant with meds? yes      Comments: Already took dose today  Allergies: No Known Drug Allergies  Anticoagulation Management History:      His anticoagulation is being managed by telephone today.  Positive risk factors for bleeding include an age of 19 years or older.  The bleeding index is 'intermediate risk'.  Positive CHADS2 values include History of HTN and Age > 19 years old.  The start date was 07/06/1998.  Anticoagulation responsible provider: Clifton James MD, Cristal Deer.  INR POC: 3.1.    Anticoagulation Management Assessment/Plan:      The patient's current anticoagulation dose is Coumadin 3 mg tabs: take as directed.  The target INR is 2 - 3.  The next INR is due 07/14/2010.  Anticoagulation instructions were given to patient.  Results were reviewed/authorized by Bethena Midget, RN, BSN.  He was notified by Bethena Midget, RN, BSN.         Prior Anticoagulation Instructions: INR 3.1  Spoke with pt.  Take 3mg  tomorrow then continue same dose of 3mg  daily except 6mg  on Wednesday and Friday.  Recheck INR in 4 weeks. Orders faxed to [facility]  Current Anticoagulation Instructions: LMOM to call for dose instructions. Bethena Midget, RN, BSN  June 16, 2010 12:16 PM  INR 3.1 Pt to take 3mg s tomorrow then resume 3mg s daily except 6mg s on Wed and Fri. Recheck in 4 weeks. Orders sent to [facility].

## 2011-01-05 NOTE — Assessment & Plan Note (Signed)
Summary: [place]im[place] svc/ f/u ov   Primary Provider/Referring Provider:  [name]  CC:  Pt here for 2 month follow up.  Pt c/o neck swelling-states this has gotten worse over the past year.  Pt also requesting rx for PT and rx for warfarin and ranitidine.  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  He has been maintained on Coumadin since then and mainly complains of intermittent dizziness dating all the way back to his stroke which is controlled on p.r.n. meclizine.  He is followed here also for hypertension and chronic leg swelling. l> r  seen 04/19/08 for fu ok except ? recurrent melanoma left leg  August 26, 2008  cleared for melanoma surgery by Cards   October 14, 2008 ov p skin graft with weaping clear fluid worse as day goes on. no fever or pain or erythema of wound or purulent drainage cp or orhtopnea/ pnd/cp/sob.  November 12, 2009 cc Yearly followup.  Pt c/o diarrhea x 2 months.  Denies any other complaints - no nocturnal symptoms, using metamucil ? why.  no cramps or wt loss.  March 02, 2010 Pt here for 2 month follow up.  Pt c/o neck swelling-states this has gotten worse over the past year.  Pt also requesting rx for PT and rx for warfarin and ranitidine.   Pt denies any significant sore throat, dysphagia, itching, sneezing,  nasal congestion or excess secretions,  fever, chills, sweats, unintended wt loss, pleuritic or exertional cp, hempoptysis, change in activity tolerance  orthopnea pnd or leg swelling     Current Medications (verified): 1)  Coumadin 3 Mg Tabs (Warfarin Sodium) .... Take As Directed 2)  Zantac 150 Mg  Tabs (Ranitidine Hcl) .... One Two Times A Day 3)  Meclizine Hcl 25 Mg  Tabs (Meclizine Hcl) .... 1/2 To 1 Every 6 Hrs As Needed For Dizzinesss 4)  Spironolactone-Hctz 25-25 Mg Tabs (Spironolactone-Hctz) .... 2 Tablets Once Daily 5)  Propoxyphene N-Apap 100-650 Mg Tabs (Propoxyphene  N-Apap) .[name].. 1 Every 6 Hours As Needed For Pain  Allergies (verified): No Known Drug Allergies  Past History:  Past Medical History: BLINDNESS, ONE EYE (ICD-369.60) CVA SINUS BRADYCARDIA (ICD-427.81) DIVERTICULOSIS, COLON (ICD-562.10) Internal hemorrhoids COUGH, CHRONIC (ICD-786.2) PVD (ICD-443.9) HYPERLIPIDEMIA (ICD-272.4) HYPERTENSION (ICD-401.9) Hyperkalemia on aldactone October 14, 2008  Melanoma,  skin graft 2003    - repeat skin graft 10/09 COPD OSA on CPAP GERD Health Maintenance ..............................................................[name] KitchenWert   -  Td 4/06   -  Pneumovax 1998  Vital Signs:  Patient profile:   77 year old male Height:      67 inches Weight:      196.50 pounds O2 Sat:      96 % on Room air Temp:     98.0 degrees F oral Pulse rate:   66 / minute BP sitting:   120 / 78  (left arm) Cuff size:   regular  Vitals Entered By: Gweneth Dimitri RN (March 02, 2010 10:10 AM)  O2 Flow:  Room air CC: Pt here for 2 month follow up.  Pt c/o neck swelling-states this has gotten worse over the past year.  Pt also requesting rx for PT and rx for warfarin and ranitidine.   Comments Medications reviewed with patient Daytime contact number verified with patient. Gweneth Dimitri RN  March 02, 2010 10:10 AM    Physical Exam  Additional Exam:  very frail amb wm  walks with cane  needs two person assist to get on exam table  wt 170 > 169 October 14, 2008 > 196  March 02, 2010  HEENT:  Unremarkable.  Oropharynx is clear. LUNG FIELDS:  Coarse breath sounds with expiratory wheezes.  HEART:  There is a regular rhythm without murmur, gallop, rub. ABDOMEN:  Soft, benign. EXTREMITIES:  Warm without calf tenderness, cyanosis, clubbing,  trace edema bilaterally   Impression & Recommendations:  Problem # 1:  HYPERTENSION (ICD-401.9)  ok on rx  His updated medication list for this problem includes:    Spironolactone-hctz 25-25 Mg Tabs (Spironolactone-hctz) .[name]... 2  tablets once daily  Orders: Est. Patient Level III (60454)  Problem # 2:  PVD (ICD-443.9)  on coumadin, no changes needed  Orders: Est. Patient Level III (09811)  Problem # 3:  WEIGHT GAIN, ABNORMAL (ICD-783.1)  neck girth is c/w effects of 26 lb wt gain over last 6 months likley in turn related to obesty.   Each maintenance medication was reviewed in detail including most importantly the difference between maintenance and as needed and under what circumstances the prns are to be used. This was done in the context of a medication calendar review which provided the patient with a user-friendly unambiguous mechanism for medication administration and reconciliation and provides an action plan for all active problems. It is critical that this be shown to every doctor  for modification during the office visit if necessary so the patient can use it as a working document.   Orders: Est. Patient Level III (91478)  Medications Added to Medication List This Visit: 1)  Zantac 150 Mg Tabs (Ranitidine hcl) .... One after breakfast and one at bedtime 2)  Spironolactone-hctz 25-25 Mg Tabs (Spironolactone-hctz) .... 2 tablets once daily  Patient Instructions: 1)  Sleep equipment through [facility] [phone] 2)  Return to office in 3 months, sooner if needed  Prescriptions: ZANTAC 150 MG  TABS (RANITIDINE HCL) one after breakfast and one at bedtime  #180 x 0   Entered and Authorized by:   Nyoka Cowden MD   Signed by:   Nyoka Cowden MD on 03/02/2010   Method used:   Electronically to        [id number]* (retail)       [address]       [place], [place]  [address]       Ph: [phone]       Fax: [phone]   RxID:   [id number] ZANTAC 150 MG  TABS (RANITIDINE HCL) one after breakfast and one at bedtime  #180 x 3   Entered and Authorized by:   Nyoka Cowden MD   Signed by:   Nyoka Cowden MD on 03/02/2010   Method used:   Print then Give to Patient   RxID:    [id number]

## 2011-01-05 NOTE — Medication Information (Signed)
Summary: Coumadin Clinic  [facility]in Clinic   Imported By: Kassie Mends 01/26/2010 11:40:09  _____________________________________________________________________  External Attachment:    Type:   Image     Comment:   External Document

## 2011-01-05 NOTE — Medication Information (Signed)
Summary: Coumadin Clinic  Anticoagulant Therapy  Managed by: Bethena Midget, RN, BSN Referring MD: [name] PCP: Nolon Lennert MD: Clifton James MD, Cristal Deer Indication 1: CVA-stroke (ICD-436) Lab Used: [facility] [place] Site: [address] PT 22.1 INR POC 2.2 INR RANGE 2 - 3  Dietary changes: no    Health status changes: no    Bleeding/hemorrhagic complications: no    Recent/future hospitalizations: no    Any changes in medication regimen? yes       Details: Tylenol and mucinex PRN   Recent/future dental: no  Any missed doses?: no       Is patient compliant with meds? yes      Comments: Pt. states 1st INR 1.3 via fingerstick so they repeated and got 2.2. Rehceck INR next week.   Allergies: No Known Drug Allergies  Anticoagulation Management History:      His anticoagulation is being managed by telephone today.  Positive risk factors for bleeding include an age of 50 years or older.  The bleeding index is 'intermediate risk'.  Positive CHADS2 values include History of HTN and Age > 55 years old.  The start date was 07/06/1998.  Prothrombin time is 22.1.  Anticoagulation responsible provider: Clifton James MD, Cristal Deer.  INR POC: 2.2.    Anticoagulation Management Assessment/Plan:      The patient's current anticoagulation dose is Coumadin 3 mg tabs: take as directed.  The target INR is 2 - 3.  The next INR is due 02/10/2010.  Anticoagulation instructions were given to patient.  Results were reviewed/authorized by Bethena Midget, RN, BSN.  He was notified by Bethena Midget, RN, BSN.         Prior Anticoagulation Instructions: INR 2.8 Continue 3mg s daily except 6mg s on W&F. Recheck in 4 weeks. Orders sent to [facility].   Current Anticoagulation Instructions: INR 2.2 Continue 3mg s daily except 6mg s on Wed and Fri. Order sent to [facility] for repeat in one week.

## 2011-01-05 NOTE — Medication Information (Signed)
Summary: Coumadin Clinic  Coumadin Clinic   Imported By: Kassie Mends 03/25/2010 10:09:51  _____________________________________________________________________  External Attachment:    Type:   Image     Comment:   External Document

## 2011-01-05 NOTE — Progress Notes (Signed)
Summary: speak to nurse  Phone Note From Other Clinic Call back at [phone]   Caller: melissa//[facility] Call For: wert Summary of Call: States pt just finished his antibiotics today but the area on his arm is still warm and red, pls advise. Initial call taken by: Darletta Moll,  July 28, 2010 10:26 AM  Follow-up for Phone Call        [facility]. [name] CMA  July 28, 2010 3:01 PM  MW pt--Spoke with caregiver Efraim Kaufmann from [facility] and she states pt finsihed a 7 day course of Keflex today for abrasion on his arm and she sees no improvement. She states it is still very red and warm to the touch. Please advise. [name] CMA  July 28, 2010 4:44 PM   [facility] [address]    Additional Follow-up for Phone Call Additional follow up Details #1::        Per SN -- pt needs OV.  [facility] Gweneth Dimitri RN  July 28, 2010 5:23 PM     Additional Follow-up for Phone Call Additional follow up Details #2::    Spoke with [facility] and advised that pt needs ov.  She states that pt is on the way to [facility] to let her look at his arm.  I advised that she needs to call us once he is seen there and so we can sched appt if the area is not improving.  Will await a call back. Vernie Murders  July 29, 2010 9:21 AM'  Melissa called back.  States pt's arm is "looking a little better but still some red and warm."  Reqeusting to go ahead and schedule OV for today.  OV scheduled with TP for this am at 11:45--Melissa aware.   Follow-up by: Gweneth Dimitri RN,  July 29, 2010 9:26 AM

## 2011-01-05 NOTE — Medication Information (Signed)
Summary: Coumadin Clinic  Anticoagulant Therapy  Managed by: Bethena Midget, RN, BSN Referring MD: [name] PCP: Nolon Lennert MD: Juanda Chance MD, Sadie Pickar Indication 1: CVA-stroke (ICD-436) Lab Used: [facility] [place] Site: [address] INR POC 2.8 INR RANGE 2 - 3  Dietary changes: no    Health status changes: no    Bleeding/hemorrhagic complications: no    Recent/future hospitalizations: no    Any changes in medication regimen? yes       Details: Took Cephalexin for one week and took last dose today.   Recent/future dental: no  Any missed doses?: no       Is patient compliant with meds? yes       Allergies: No Known Drug Allergies  Anticoagulation Management History:      His anticoagulation is being managed by telephone today.  Positive risk factors for bleeding include an age of 75 years or older.  The bleeding index is 'intermediate risk'.  Positive CHADS2 values include History of HTN and Age > 44 years old.  The start date was 07/06/1998.  Anticoagulation responsible provider: Juanda Chance MD, Smitty Cords.  INR POC: 2.8.    Anticoagulation Management Assessment/Plan:      The patient's current anticoagulation dose is Coumadin 3 mg tabs: take as directed.  The target INR is 2 - 3.  The next INR is due 10/06/2010.  Anticoagulation instructions were given to patient.  Results were reviewed/authorized by Bethena Midget, RN, BSN.  He was notified by Bethena Midget, RN, BSN.         Prior Anticoagulation Instructions: INR 3.1  Spoke with pt.  Skip tomorrow's dose of Coumadin then resume same dose of 1 tablet every day except 2 tablets on Friday.  Recheck INR in 2 weeks.  Orders faxed to [facility].   Current Anticoagulation Instructions: INR 2.8 Continuue 3mg s daily except 6mg s on Fridays. Recheck in 3 weeks.

## 2011-01-05 NOTE — Progress Notes (Signed)
Summary: cpap  Phone Note Call from Patient Call back at [phone]   Caller: Son-Rod Berrios Call For: Quintara Bost Reason for Call: Talk to Nurse Summary of Call: Sleep Equipment - need air filters for cpap, need to go to [facility] & get eval for a new mask, pt feels may not be a good seal.  pt has had same mask for about 2 years.  Please contact son re: these matters.  [facility] needs an order from [facility] Initial call taken by: Eugene Gavia,  March 02, 2010 12:00 PM  Follow-up for Phone Call        dr Trason Shifflet if this is ok with you pls send order to [facility] Follow-up by: Philipp Deputy CMA,  March 02, 2010 1:33 PM  Additional Follow-up for Phone Call Additional follow up Details #1::        done Additional Follow-up by: Nyoka Cowden MD,  March 02, 2010 1:43 PM  New Problems: SINUSITIS  ACUTE, UNSPECIFIED (ICD-461.9)   Additional Follow-up for Phone Call Additional follow up Details #2::    pt son advised. [name] CMA  March 02, 2010 2:40 PM   New Problems: SINUSITIS  ACUTE, UNSPECIFIED (ICD-461.9)

## 2011-01-05 NOTE — Assessment & Plan Note (Signed)
Summary: Primary svc/ f/u cough/ edema    Primary Provider/Referring Provider:  [name]  CC:  1 month followup.  Pt states that his chest congestion has resolved.  Still has occ cough that his non prod.  He c/o not being able to move his left thumb.  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  He has been maintained on Coumadin since then and mainly complains of intermittent dizziness dating all the way back to his stroke which is controlled on p.r.n. meclizine.  He is followed here also for hypertension and chronic leg swelling. l> r  seen 04/19/08 for fu ok except ? recurrent melanoma left leg  August 26, 2008  cleared for melanoma surgery by Cards   October 14, 2008 ov p skin graft with weaping clear fluid worse as day goes on. no fever or pain or erythema of wound or purulent drainage cp or orhtopnea/ pnd/cp/sob.  November 12, 2009 cc Yearly followup.  Pt c/o diarrhea x 2 months.  Denies any other complaints - no nocturnal symptoms, using metamucil ? why.  no cramps or wt loss.  March 02, 2010 Pt here for 2 month follow up.  Pt c/o neck swelling-states this has gotten worse over the past year.  Pt also requesting rx for PT and rx for warfarin and ranitidine.     May 13, 2010 ov  cc cough off and on for 5 weeks  with white sputum.  He states that he has noticed wheezing for the past few days.  some nasal congestion and sneezing.   swallowing worse lately esp mucinex dm. rec change zantac bedtime, protonix before bfast and diet.  June 02, 2010 1 month followup.  Pt states that his chest congestion has resolved.  Still has occ cough that his non prod.  He c/o not being able to move his left thumb intermittent cramps up.  Pt is currently on cephalexin 500 mg  for bacterial infection per [facility].  Pt denies any significant sore throat, dysphagia, itching,  sneezing,  nasal congestion or excess secretions,  fever, chills, sweats, unintended wt loss, pleuritic or exertional cp, hempoptysis, change in activity tolerance  orthopnea pnd.  Current Medications (verified): 1)  Coumadin 3 Mg Tabs (Warfarin Sodium) .... Take As Directed 2)  Zantac 150 Mg  Tabs (Ranitidine Hcl) .... 2 At Bedtime 3)  Protonix 40 Mg Tbec (Pantoprazole Sodium) .... Take  One 30-60 Min Before First Meal of The Day 4)  Spironolactone-Hctz 25-25 Mg Tabs (Spironolactone-Hctz) .... 2 Tablets Once Daily 5)  Meclizine Hcl 25 Mg  Tabs (Meclizine Hcl) .... 1/2 To 1 Every 6 Hrs As Needed For Dizzinesss 6)  Propoxyphene N-Apap 100-650 Mg Tabs (Propoxyphene N-Apap) .Marland Kitchen.. 1 Every 6 Hours As Needed For Pain 7)  Mucinex Dm 30-600 Mg Xr12h-Tab (Dextromethorphan-Guaifenesin) .... 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) CVA SINUS BRADYCARDIA (ICD-427.81) DIVERTICULOSIS, COLON (ICD-562.10) Internal hemorrhoids COUGH, CHRONIC (ICD-786.2) PVD (ICD-443.9) HYPERLIPIDEMIA (ICD-272.4) HYPERTENSION (ICD-401.9) Hyperkalemia on aldactone October 14, 2008  Melanoma,  skin graft 2003..............................................Marland KitchenTafeen    - repeat skin graft 10/09 COPD OSA on CPAP GERD Health Maintenance ................................................................Marland KitchenWert   -  Td 4/06   -  Pneumovax 1998  Vital Signs:  Patient profile:   77 year  old male Weight:      191 pounds O2 Sat:      96 % on Room air Temp:     98.0 degrees F oral Pulse rate:   71 / minute BP sitting:   122 / 62  (left arm)  Vitals Entered By: Vernie Murders (June 02, 2010 10:48 AM)  O2 Flow:  Room air  Physical Exam  Additional Exam:  very frail amb wm walks with cane  no longer getting up on exam ta ble  wt 170 > 169 October 14, 2008 > 196  March 02, 2010 > 188 May 13, 2010 > 191 June 02, 2010    HEENT:  Unremarkable.  Oropharynx is clear. LUNG FIELDS:  Coarse breath sounds with expiratory wheezes.  HEART:  There is a regular rhythm without murmur, gallop, rub. ABDOMEN:  Soft, benign. EXTREMITIES:  Warm without calf tenderness, left leg in ace wrap   Sodium                    141 mEq/L                   135-145   Potassium                 4.9 mEq/L                   3.5-5.1   Chloride                  108 mEq/L                   96-112   Carbon Dioxide            28 mEq/L                    19-32   Glucose              [H]  113 mg/dL                   16-10   BUN                  [H]  31 mg/dL                    9-60   Creatinine                1.4 mg/dL                   4.5-4.0   Calcium                   8.6 mg/dL                   9.8-11.9   GFR                       49.52 mL/min                >60  Tests: (2) CBC Platelet w/Diff (CBCD)   White Cell Count          7.6 K/uL                    4.5-10.5   Red Cell Count       [L]  3.81 Mil/uL  4.22-5.81   Hemoglobin           [L]  12.3 g/dL                   62.3-76.2   Hematocrit           [L]  35.4 %                      39.0-52.0   MCV                       92.9 fl                     78.0-100.0   MCHC                      34.7 g/dL                   83.1-51.7   RDW                       13.1 %                      11.5-14.6   Platelet Count            288.0 K/uL                  150.0-400.0   Neutrophil %              67.1 %                      43.0-77.0   Lymphocyte %              23.2 %                      12.0-46.0   Monocyte %                7.4 %                       3.0-12.0   Eosinophils%              2.1 %                       0.0-5.0   Basophils %               0.2 %                       0.0-3.0   Neutrophill Absolute      5.1 K/uL                    1.4-7.7   Lymphocyte Absolute       1.8 K/uL                    0.7-4.0   Monocyte Absolute         0.6 K/uL                    0.1-1.0   Eosinophils, Absolute                             0.2 K/uL  0.0-0.7   Basophils Absolute        0.0 K/uL                    0.0-0.1  Tests: (3) TSH (TSH)   FastTSH                   1.47 uIU/mL                 0.35-5.50  Impression & Recommendations:  Problem # 1:  EDEMA (ICD-782.3) ok control on rx, k is ok on spirononactone     Problem # 2:  HYPERTENSION (ICD-401.9)  ok on rx  His updated medication list for this problem includes:    Spironolactone-hctz 25-25 Mg Tabs (Spironolactone-hctz) .Marland Kitchen... 2 tablets once daily     Problem # 3:  GERD (ICD-530.81) The most common causes of chronic cough in immunocompetent adults include: upper airway cough syndrome (UACS), previously referred to as postnasal drip syndrome,  caused by variety of rhinosinus conditions; (2) asthma; (3) GERD; (4) chronic bronchitis from cigarette smoking or other inhaled environmental irritants; (5) nonasthmatic eosinophilic bronchitis; and (6) bronchiectasis. These conditions, singly or in combination, have accounted for up to 94% of the causes of chronic cough in prospective studies.   Most likely this is gerd related coughing, better on rx and diet with ongoing concerns asp due to cva distinguished  His updated medication list for this problem includes:    Zantac 150 Mg Tabs (Ranitidine hcl) .Marland Kitchen... 2 at bedtime    Protonix 40 Mg Tbec (Pantoprazole sodium) .Marland Kitchen... Take  one 30-60 min before first meal of the day  Other Orders: TLB-BMP (Basic Metabolic Panel-BMET) (80048-METABOL) TLB-CBC Platelet - w/Differential (85025-CBCD) TLB-TSH (Thyroid Stimulating Hormone) (84443-TSH) Est. Patient Level IV (57846)  Patient Instructions: 1)  Return to office in 3 months, sooner if needed  2)  Follow up with Dr Jorja Loa in meantime 3)  Copy sent to: [facility]

## 2011-01-05 NOTE — Letter (Signed)
Summary: [facility] - PT/INR  [facility]   Imported By: Marylou Mccoy 08/17/2010 15:03:56  _____________________________________________________________________  External Attachment:    Type:   Image     Comment:   External Document

## 2011-01-05 NOTE — Medication Information (Signed)
Summary: Coumadin Clinic  Anticoagulant Therapy  Managed by: Weston Brass, PharmD Referring MD: [name] PCP: Nolon Lennert MD: Shirlee Latch MD, Freida Busman Indication 1: CVA-stroke (ICD-436) Lab Used: [facility] [place] [place] Site: [address] PT 32.4 INR POC 3.2 INR RANGE 2 - 3  Dietary changes: no    Health status changes: no    Bleeding/hemorrhagic complications: no    Recent/future hospitalizations: no    Any changes in medication regimen? no    Recent/future dental: no  Any missed doses?: no       Is patient compliant with meds? yes       Allergies: No Known Drug Allergies  Anticoagulation Management History:      His anticoagulation is being managed by telephone today.  Positive risk factors for bleeding include an age of 21 years or older.  The bleeding index is 'intermediate risk'.  Positive CHADS2 values include History of HTN and Age > 11 years old.  The start date was 07/06/1998.  Prothrombin time is 32.4.  Anticoagulation responsible provider: Shirlee Latch MD, Eliakim Tendler.  INR POC: 3.2.    Anticoagulation Management Assessment/Plan:      The patient's current anticoagulation dose is Coumadin 3 mg tabs: take as directed.  The target INR is 2 - 3.  The next INR is due 10/26/2010.  Anticoagulation instructions were given to patient.  Results were reviewed/authorized by Weston Brass, PharmD.  He was notified by Weston Brass PharmD.         Prior Anticoagulation Instructions: INR 2.8 Continuue 3mg s daily except 6mg s on Fridays. Recheck in 3 weeks.   Current Anticoagulation Instructions: INR 3.2  Attempted to call pt with results.  LMOM TCB. Cloyde Reams RN  October 05, 2010 11:01 AM    Spoke with pt.  Skip tomorrow's dose of Coumadin then resume same dose of 1 tablet every day except 2 tablets on Friday.  Recheck INR in 3 weeks.

## 2011-01-05 NOTE — Miscellaneous (Signed)
Summary: [facility]  [facility]   Imported By: Lester Lebanon 09/17/2010 08:03:33  _____________________________________________________________________  External Attachment:    Type:   Image     Comment:   External Document

## 2011-01-05 NOTE — Assessment & Plan Note (Signed)
Summary: NP follow up - right forearm cellulitis   Vital Signs:  Patient profile:   77 year old male Height:      67 inches Weight:      191.38 pounds BMI:     30.08 O2 Sat:      96 % on Room air Temp:     97.7 degrees F oral Pulse rate:   58 / minute BP sitting:   124 / 76  (right arm) Cuff size:   regular  Vitals Entered By: Boone Master CNA/MA (July 29, 2010 12:40 PM)  O2 Flow:  Room air CC: saw MW for abrasion right forearm, was given keflex - finished this morning.  states no better and has increased warmth x2days.  denies pain, increased edema. Is Patient Diabetic? No Comments Medications reviewed with patient Daytime contact number verified with patient. Boone Master CNA/MA  July 29, 2010 12:42 PM    Primary Eunique Balik/Referring Kirin Pastorino:  [name]  CC:  saw MW for abrasion right forearm, was given keflex - finished this morning.  states no better and has increased warmth x2days.  denies pain, and increased edema..  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  He has been maintained on Coumadin since then and mainly complains of intermittent dizziness dating all the way back to his stroke which is controlled on p.r.n. meclizine.  He is followed here also for hypertension and chronic leg swelling. l> r  seen 04/19/08 for fu ok except ? recurrent melanoma left leg  August 26, 2008  cleared for melanoma surgery by Cards   October 14, 2008 ov p skin graft with weaping clear fluid worse as day goes on. no fever or pain or erythema of wound or purulent drainage cp or orhtopnea/ pnd/cp/sob.  November 12, 2009 cc Yearly followup.  Pt c/o diarrhea x 2 months.  Denies any other complaints - no nocturnal symptoms, using metamucil ? why.  no cramps or wt loss.  March 02, 2010 Pt here for 2 month follow up.  Pt c/o neck swelling-states this has gotten worse over the past year.  Pt also requesting rx for PT and rx  for warfarin and ranitidine.     May 13, 2010 ov  cc cough off and on for 5 weeks  with white sputum.  He states that he has noticed wheezing for the past few days.  some nasal congestion and sneezing.   swallowing worse lately esp mucinex dm. rec change zantac bedtime, protonix before bfast and diet.  June 02, 2010 1 month followup.  Pt states that his chest congestion has resolved.  Still has occ cough that his non prod.  He c/o not being able to move his left thumb intermittent cramps up.  Pt is currently on cephalexin 500 mg  for bacterial infection per [facility].    August 14 th fell and tore skin R Elbow  July 22, 2010 ov NH nurse concerned that developing cellulitis at abrasion site/ right forearm.  no fever or increase pain, minimal swelling.  --   July 29, 2010--Presents for follow up for right arm cellulitis.  Was seen 1 week ago, tx for for abrasion right forearm, was given keflex - finished this morning.  states no better and has increased warmth x2days. The skin is very thin, nurses are apply a clear dressing but removing it pulls at skin. no fever, confusion, or increased drainage. Denies chest pain,  orthopnea, hemoptysis, fever, n/v/d, edema, headache.   Medications Prior to Update: 1)  Coumadin 3 Mg Tabs (Warfarin Sodium) .... Take As Directed 2)  Zantac 150 Mg  Tabs (Ranitidine Hcl) .... 2 At Bedtime 3)  Protonix 40 Mg Tbec (Pantoprazole Sodium) .... Take  One 30-60 Min Before First Meal of The Day 4)  Spironolactone-Hctz 25-25 Mg Tabs (Spironolactone-Hctz) .... 2 Tablets Once Daily 5)  Meclizine Hcl 25 Mg  Tabs (Meclizine Hcl) .... 1/2 To 1 Every 6 Hrs As Needed For Dizzinesss 6)  Propoxyphene N-Apap 100-650 Mg Tabs (Propoxyphene N-Apap) .Marland Kitchen.. 1 Every 6 Hours As Needed For Pain 7)  Mucinex Dm 30-600 Mg Xr12h-Tab (Dextromethorphan-Guaifenesin) .... 2 Two Times A Day As Needed 8)  Keflex 500 Mg Caps (Cephalexin) .... One Twice Daily  Current Medications (verified): 1)   Coumadin 3 Mg Tabs (Warfarin Sodium) .... Take As Directed 2)  Zantac 150 Mg  Tabs (Ranitidine Hcl) .... 2 At Bedtime 3)  Protonix 40 Mg Tbec (Pantoprazole Sodium) .... Take  One 30-60 Min Before First Meal of The Day 4)  Spironolactone-Hctz 25-25 Mg Tabs (Spironolactone-Hctz) .... 2 Tablets Once Daily 5)  Meclizine Hcl 25 Mg  Tabs (Meclizine Hcl) .... 1/2 To 1 Every 6 Hrs As Needed For Dizzinesss 6)  Propoxyphene N-Apap 100-650 Mg Tabs (Propoxyphene N-Apap) .Marland Kitchen.. 1 Every 6 Hours As Needed For Pain 7)  Mucinex Dm 30-600 Mg Xr12h-Tab (Dextromethorphan-Guaifenesin) .... 2 Two Times A Day As Needed 8)  Doxycycline Hyclate 100 Mg Caps (Doxycycline Hyclate) .Marland Kitchen.. 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) CVA SINUS BRADYCARDIA (ICD-427.81) DIVERTICULOSIS, COLON (ICD-562.10) Internal hemorrhoids COUGH, CHRONIC (ICD-786.2) PVD (ICD-443.9) HYPERLIPIDEMIA (ICD-272.4) HYPERTENSION (ICD-401.9) Hyperkalemia on aldactone October 14, 2008  Melanoma,  skin graft 2003..............................................Marland KitchenTafeen    - repeat skin graft 10/09 COPD OSA on CPAP GERD Health Maintenance ................................................................Marland KitchenWert   -  Td 4/06   -  Pneumovax 1998  Past Surgical History: Last updated: 08/19/2008 Melanoma on left lower leg removed and skin grafting Hernia Surgery  Family History: Last updated: 08/15/2008 Family History of Heart Disease: Father No FH of Colon Cancer  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  Alcohol Use - no Daily Caffeine Use <1 Illicit Drug Use - no  Risk Factors: Smoking Status: quit (07/22/2010)  Review of Systems      See HPI  Physical Exam  Additional Exam:  very frail amb wm walks with cane  no longer getting up on exam ta ble  wt   169 October 14, 2008 > 196  March 02, 2010 > 188 May 13, 2010 > 191 June 02, 2010 > 193 July 22, 2010  HEENT:  Unremarkable.  Oropharynx is clear. LUNG FIELDS:  Coarse breath sounds with expiratory wheezes.  HEART:  There is a regular rhythm without murmur, gallop, rub. ABDOMEN:  Soft, benign. EXTREMITIES:  Warm without calf tenderness, abrasion/ skin tear x 8 cm prox Right forearm, mild sts and surrounding erthema, from elbow and no crepitance or woody induriation/ suppurance   Impression & Recommendations:  Problem # 1:  ABRASION, INFECTED (ICD-919.1)  right arm cellulitis--poorly healing will add doxycycline x 7 days wound was cleaned and dressed. w/ telfa.  Wd cx pending.  Plan:  Doxycyline 100mg  two times a day for 10 days Wash with soap/water , pat dry.  apply neosporin daily  Cover with telfa non stick bandage, pad with 4x 4 and wrap with kling wrap to protect. until healed.  follow up in 10 days , and as needed  Please contact office for sooner follow up if symptoms do not improve or worsen   Orders: T-Culture, Wound ([id number]) Est. Patient Level IV (16109)  Medications Added to Medication List This Visit: 1)  Doxycycline Hyclate 100 Mg Caps (Doxycycline hyclate) .Marland Kitchen.. 1 by mouth  two times a day  Complete Medication List: 1)  Coumadin 3 Mg Tabs (Warfarin sodium) .... Take as directed 2)  Zantac 150 Mg Tabs (Ranitidine hcl) .... 2 at bedtime 3)  Protonix 40 Mg Tbec (Pantoprazole sodium) .... Take  one 30-60 min before first meal of the day 4)  Spironolactone-hctz 25-25 Mg Tabs (Spironolactone-hctz) .... 2 tablets once daily 5)  Meclizine Hcl 25 Mg Tabs (Meclizine hcl) .... 1/2 to 1 every 6 hrs as needed for dizzinesss 6)  Propoxyphene N-apap 100-650 Mg Tabs (Propoxyphene n-apap) .Marland Kitchen.. 1 every 6 hours as needed for pain 7)  Mucinex Dm 30-600 Mg Xr12h-tab (Dextromethorphan-guaifenesin) .... 2 two times a day as needed 8)  Doxycycline Hyclate 100 Mg Caps  (Doxycycline hyclate) .Marland Kitchen.. 1 by mouth  two times a day  Patient Instructions: 1)  Doxycyline 100mg  two times a day for 10 days 2)  Wash with soap/water , pat dry. apply neosporin daily  3)  Cover with telfa non stick bandage, pad with 4x 4 and wrap with kling wrap to protect. until healed.  4)  follow up in 10 days , and as needed  5)  Please contact office for sooner follow up if symptoms do not improve or worsen  Prescriptions: DOXYCYCLINE HYCLATE 100 MG CAPS (DOXYCYCLINE HYCLATE) 1 by mouth  two times a day  #20 x 0   Entered and Authorized by:   Rubye Oaks NP   Signed by:   Rubye Oaks NP on 07/29/2010   Method used:   Electronically to        [facility] [id number] (retail)       [address]       [place], [place]  [address]       Ph: [phone]       Fax: [phone]   RxID:   [id number]

## 2011-01-05 NOTE — Medication Information (Signed)
Summary: Coumadin Clinic  Anticoagulant Therapy  Managed by: Bethena Midget, RN, BSN Referring MD: [name] PCP: Nolon Lennert MD: Graciela Husbands MD, Viviann Spare Indication 1: CVA-stroke (ICD-436) Lab Used: [facility] [place] Site: [address] INR POC 2.8 INR RANGE 2 - 3  Dietary changes: no    Health status changes: no    Bleeding/hemorrhagic complications: no    Recent/future hospitalizations: no    Any changes in medication regimen? yes       Details: Was using mucinex and switched to claritin   Recent/future dental: no  Any missed doses?: no       Is patient compliant with meds? yes      Comments: Pt. denies any change other than nurse at facility suggested he use claritin instead of mucinex and he states he is feeling better with the change. Denies any other concerns.   Allergies: No Known Drug Allergies  Anticoagulation Management History:      His anticoagulation is being managed by telephone today.  Positive risk factors for bleeding include an age of 30 years or older.  The bleeding index is 'intermediate risk'.  Positive CHADS2 values include History of HTN and Age > 75 years old.  The start date was 07/06/1998.  Anticoagulation responsible provider: Graciela Husbands MD, Viviann Spare.  INR POC: 2.8.    Anticoagulation Management Assessment/Plan:      The patient's current anticoagulation dose is Coumadin 3 mg tabs: take as directed.  The target INR is 2 - 3.  The next INR is due 02/03/2010.  Anticoagulation instructions were given to patient.  Results were reviewed/authorized by Bethena Midget, RN, BSN.  He was notified by Bethena Midget, RN, BSN.         Prior Anticoagulation Instructions: INR 2.2 Today take 4.5mg s then resume 3mg s daily except 6mg s on Wednesdays and Fridays. Recheck in 4 weeks. Orders sent to [facility], [place].   Current Anticoagulation Instructions: INR 2.8 Continue 3mg s daily except 6mg s on W&F. Recheck in 4 weeks. Orders sent to [facility].

## 2011-01-05 NOTE — Medication Information (Signed)
Summary: Coumadin Clinic  Anticoagulant Therapy  Managed by: Bethena Midget, RN, BSN Referring MD: [name] PCP: Nolon Lennert MD: Eden Emms MD, Theron Arista Indication 1: CVA-stroke (ICD-436) Lab Used: [facility] [place] Site: [address] INR POC 2.0 INR RANGE 2 - 3  Dietary changes: no    Health status changes: no    Bleeding/hemorrhagic complications: no    Recent/future hospitalizations: no    Any changes in medication regimen? no    Recent/future dental: no  Any missed doses?: no       Is patient compliant with meds? yes       Allergies: No Known Drug Allergies  Anticoagulation Management History:      His anticoagulation is being managed by telephone today.  Positive risk factors for bleeding include an age of 75 years or older.  The bleeding index is 'intermediate risk'.  Positive CHADS2 values include History of HTN and Age > 45 years old.  The start date was 07/06/1998.  Anticoagulation responsible provider: Eden Emms MD, Theron Arista.  INR POC: 2.0.    Anticoagulation Management Assessment/Plan:      The patient's current anticoagulation dose is Coumadin 3 mg tabs: take as directed.  The target INR is 2 - 3.  The next INR is due 04/21/2010.  Anticoagulation instructions were given to patient.  Results were reviewed/authorized by Bethena Midget, RN, BSN.  He was notified by Bethena Midget, RN,BSN.         Prior Anticoagulation Instructions: INR 2.4 Continue 3mg s daily except 6mg s Wed and Friday.  REcheck in 4 weeks.   Current Anticoagulation Instructions: INR 2.0 Today 6mg s then resume 3mg s daily except 6mg s on Wed and Fridays. Recheck in 3 weeks. Orders sent to [facility].

## 2011-01-05 NOTE — Medication Information (Signed)
Summary: Coumadin Clinic  Anticoagulant Therapy  Managed by: Bethena Midget, RN, BSN Referring MD: [name] PCP: Nolon Lennert MD: Clifton James MD, Cristal Deer Indication 1: CVA-stroke (ICD-436) Lab Used: [facility] [place] Site: [address] INR POC 2.7 INR RANGE 2 - 3  Dietary changes: no    Health status changes: no    Bleeding/hemorrhagic complications: no    Recent/future hospitalizations: no    Any changes in medication regimen? yes       Details: Finished Levaquin last week he thinks on Thursday.   Recent/future dental: no  Any missed doses?: no       Is patient compliant with meds? yes       Allergies: No Known Drug Allergies  Anticoagulation Management History:      His anticoagulation is being managed by telephone today.  Positive risk factors for bleeding include an age of 75 years or older.  The bleeding index is 'intermediate risk'.  Positive CHADS2 values include History of HTN and Age > 37 years old.  The start date was 07/06/1998.  Anticoagulation responsible provider: Clifton James MD, Cristal Deer.  INR POC: 2.7.    Anticoagulation Management Assessment/Plan:      The patient's current anticoagulation dose is Coumadin 3 mg tabs: take as directed.  The target INR is 2 - 3.  The next INR is due 09/01/2010.  Anticoagulation instructions were given to patient.  Results were reviewed/authorized by Bethena Midget, RN, BSN.  He was notified by Bethena Midget, RN, BSN.         Prior Anticoagulation Instructions: INR 3.5 Skip tomorrow's dose the change dose to 3mg s daily while on Levaquin per Pharm D.  Orders sent to [facility] to recheck in 2 weeks.   Current Anticoagulation Instructions: INR 2.7 Resume 3mg s everyday except 6mg s on Fridays. Recheck in 2 weeks.

## 2011-01-05 NOTE — Medication Information (Signed)
Summary: Coumadin Clinic  Anticoagulant Therapy  Managed by: Bethena Midget, RN, BSN Referring MD: [name] PCP: Nolon Lennert MD: Juanda Chance MD, Debar Plate Indication 1: CVA-stroke (ICD-436) Lab Used: [facility] [place] Site: [address] INR POC 2.2 INR RANGE 2 - 3  Dietary changes: no    Health status changes: yes       Details: cold s&s, congestion, sore throat  Bleeding/hemorrhagic complications: no    Recent/future hospitalizations: no    Any changes in medication regimen? yes       Details: Using OTC cold med, will call if there are any med changes.   Recent/future dental: no  Any missed doses?: no       Is patient compliant with meds? yes       Allergies: No Known Drug Allergies  Anticoagulation Management History:      His anticoagulation is being managed by telephone today.  Positive risk factors for bleeding include an age of 75 years or older.  The bleeding index is 'intermediate risk'.  Positive CHADS2 values include History of HTN and Age > 74 years old.  The start date was 07/06/1998.  Anticoagulation responsible provider: Juanda Chance MD, Smitty Cords.  INR POC: 2.2.    Anticoagulation Management Assessment/Plan:      The patient's current anticoagulation dose is Coumadin 3 mg tabs: take as directed.  The target INR is 2 - 3.  The next INR is due 01/06/2010.  Anticoagulation instructions were given to patient.  Results were reviewed/authorized by Bethena Midget, RN, BSN.  He was notified by Bethena Midget, RN, BSN.         Prior Anticoagulation Instructions: INR 2.6  Attempted to contact pt.  LMOM TCB.  Cloyde Reams RN  November 11, 2009 3:09 PM  11/12/09@ 210pm- LMOM for pt. TCB. T.Muse, RN 11/12/09@223pm - Pt. returned call, denied any changes. He's aware to continue same dose of 3mg s daily except 6mg s on Wed and Fri. and recheck INR on 12/09/09. Orders sent to [facility]. T.Muse, RN  Current Anticoagulation Instructions: INR 2.2 Today take 4.5mg s then resume 3mg s  daily except 6mg s on Wednesdays and Fridays. Recheck in 4 weeks. Orders sent to [facility], [place].

## 2011-01-05 NOTE — Medication Information (Signed)
Summary: Coumadin Clinic   Anticoagulant Therapy  Managed by: Weston Brass, PharmD Referring MD: [name] PCP: Nolon Lennert MD: Tenny Craw MD, Gunnar Fusi Indication 1: CVA-stroke (ICD-436) Lab Used: [facility] [place] Site: [address] PT 21.4 INR POC 2.1 INR RANGE 2 - 3  Dietary changes: no    Health status changes: no    Bleeding/hemorrhagic complications: no    Recent/future hospitalizations: no    Any changes in medication regimen? no    Recent/future dental: no  Any missed doses?: no       Is patient compliant with meds? yes      Comments: Pt worried that he will have a stroke and insists on INR being higher than 2.5.  Wants to go back to previous dose of Coumadin.   Allergies: 1)  ! Sulfa  Anticoagulation Management History:      His anticoagulation is being managed by telephone today.  Positive risk factors for bleeding include an age of 75 years or older.  The bleeding index is 'intermediate risk'.  Positive CHADS2 values include History of HTN and Age > 75 years old.  The start date was 07/06/1998.  Prothrombin time is 21.4.  Anticoagulation responsible provider: Tenny Craw MD, Gunnar Fusi.  INR POC: 2.1.    Anticoagulation Management Assessment/Plan:      The patient's current anticoagulation dose is Coumadin 3 mg tabs: take as directed.  The target INR is 2 - 3.  The next INR is due 11/23/2010.  Anticoagulation instructions were given to patient.  Results were reviewed/authorized by Weston Brass, PharmD.  He was notified by Weston Brass PharmD.         Prior Anticoagulation Instructions: INR 3.4 Skip tomorrow's  dose then change dose to 3mg s daily. Recheck in 2 weeks.   Current Anticoagulation Instructions: INR 2.1  Spoke with pt.  Change dose to 1 tablet every day except 2 tablets on Friday.  Recheck INR in 2 weeks.  Orders faxed to [facility].

## 2011-01-05 NOTE — Medication Information (Signed)
Summary: Coumadin Clinic  Anticoagulant Therapy  Managed by: Weston Brass, PharmD Referring MD: [name] PCP: Nolon Lennert MD: Excell Seltzer MD, Casimiro Needle Indication 1: CVA-stroke (ICD-436) Lab Used: [facility] [place] Site: [address] PT 31.2 INR POC 3.1 INR RANGE 2 - 3  Dietary changes: no    Health status changes: no    Bleeding/hemorrhagic complications: no    Recent/future hospitalizations: no    Any changes in medication regimen? yes       Details: was on prednisone taper  Recent/future dental: no  Any missed doses?: no       Is patient compliant with meds? yes       Allergies: No Known Drug Allergies  Anticoagulation Management History:      His anticoagulation is being managed by telephone today.  Positive risk factors for bleeding include an age of 17 years or older.  The bleeding index is 'intermediate risk'.  Positive CHADS2 values include History of HTN and Age > 73 years old.  The start date was 07/06/1998.  Prothrombin time is 31.2.  Anticoagulation responsible provider: Excell Seltzer MD, Casimiro Needle.  INR POC: 3.1.    Anticoagulation Management Assessment/Plan:      The patient's current anticoagulation dose is Coumadin 3 mg tabs: take as directed.  The target INR is 2 - 3.  The next INR is due 06/16/2010.  Anticoagulation instructions were given to patient.  Results were reviewed/authorized by Weston Brass, PharmD.  He was notified by Weston Brass PharmD.         Prior Anticoagulation Instructions: INR 2.4 Continue 3mg  daily except 6mg s on Wednesdays and Fridays. Recheck in 4 weeks. Orders sent to [facility].   Current Anticoagulation Instructions: INR 3.1  Spoke with pt.  Take 3mg  tomorrow then continue same dose of 3mg  daily except 6mg  on Wednesday and Friday.  Recheck INR in 4 weeks. Orders faxed to [facility]

## 2011-01-05 NOTE — Medication Information (Signed)
Summary: [facility]  [facility]   Imported By: Lester [place] 05/13/2010 08:35:10  _____________________________________________________________________  External Attachment:    Type:   Image     Comment:   External Document

## 2011-01-05 NOTE — Medication Information (Signed)
Summary: Coumadin Clinic  Anticoagulant Therapy  Managed by: Bethena Midget, RN, BSN Referring MD: [name] PCP: Nolon Lennert MD: Eden Emms MD, Theron Arista Indication 1: CVA-stroke (ICD-436) Lab Used: [facility] [place] Site: [address] INR POC 3.5 INR RANGE 2 - 3  Dietary changes: no    Health status changes: yes       Details: Has wound on Rt arm that is being treated   Bleeding/hemorrhagic complications: no    Recent/future hospitalizations: no    Any changes in medication regimen? yes       Details: Pt presently on Doxycycline BID, but due to C&S he will switch over to Levaquin daily for 10 days starting today.   Recent/future dental: no  Any missed doses?: no       Is patient compliant with meds? yes       Allergies: No Known Drug Allergies  Anticoagulation Management History:      His anticoagulation is being managed by telephone today.  Positive risk factors for bleeding include an age of 75 years or older.  The bleeding index is 'intermediate risk'.  Positive CHADS2 values include History of HTN and Age > 34 years old.  The start date was 07/06/1998.  Anticoagulation responsible provider: Eden Emms MD, Theron Arista.  INR POC: 3.5.    Anticoagulation Management Assessment/Plan:      The patient's current anticoagulation dose is Coumadin 3 mg tabs: take as directed.  The target INR is 2 - 3.  The next INR is due 08/18/2010.  Anticoagulation instructions were given to patient.  Results were reviewed/authorized by Bethena Midget, RN, BSN.  He was notified by Bethena Midget, RN, BSN.         Prior Anticoagulation Instructions: INR 3.2  Spoke with pt.  Decrease dose to 3mg  every day except 6mg  on Friday. Recheck INR in 3 weeks.  Orders faxed to [facility].   Current Anticoagulation Instructions: INR 3.5 Skip tomorrow's dose the change dose to 3mg s daily while on Levaquin per Pharm D.  Orders sent to [facility] to recheck in 2 weeks.

## 2011-01-05 NOTE — Miscellaneous (Signed)
Summary: Treatment Plan for OT/[facility]  Treatment Plan for OT/[facility]   Imported By: Sherian Rein 04/17/2010 08:57:04  _____________________________________________________________________  External Attachment:    Type:   Image     Comment:   External Document

## 2011-01-05 NOTE — Letter (Signed)
Summary: CMN/[facility]  [facility]   Imported By: Lester [place] 06/16/2010 10:32:26  _____________________________________________________________________  External Attachment:    Type:   Image     Comment:   External Document

## 2011-01-05 NOTE — Assessment & Plan Note (Signed)
Summary: Primary svc/ acute eval for RUE abrasion   Primary Provider/Referring Provider:  [name]  CCLarey Seat on Sunday-tear in skin on Right arm near elbow-redness and tender to the touch..  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  He has been maintained on Coumadin since then and mainly complains of intermittent dizziness dating all the way back to his stroke which is controlled on p.r.n. meclizine.  He is followed here also for hypertension and chronic leg swelling. l> r  seen 04/19/08 for fu ok except ? recurrent melanoma left leg  August 26, 2008  cleared for melanoma surgery by Cards   October 14, 2008 ov p skin graft with weaping clear fluid worse as day goes on. no fever or pain or erythema of wound or purulent drainage cp or orhtopnea/ pnd/cp/sob.  November 12, 2009 cc Yearly followup.  Pt c/o diarrhea x 2 months.  Denies any other complaints - no nocturnal symptoms, using metamucil ? why.  no cramps or wt loss.  March 02, 2010 Pt here for 2 month follow up.  Pt c/o neck swelling-states this has gotten worse over the past year.  Pt also requesting rx for PT and rx for warfarin and ranitidine.     May 13, 2010 ov  cc cough off and on for 5 weeks  with white sputum.  He states that he has noticed wheezing for the past few days.  some nasal congestion and sneezing.   swallowing worse lately esp mucinex dm. rec change zantac bedtime, protonix before bfast and diet.  June 02, 2010 1 month followup.  Pt states that his chest congestion has resolved.  Still has occ cough that his non prod.  He c/o not being able to move his left thumb intermittent cramps up.  Pt is currently on cephalexin 500 mg  for bacterial infection per Tafeen.    August 14 th fell and tore skin R Elbow  July 22, 2010 ov NH nurse concerned that developing cellulitis at abrasion site/ right forearm.  no fever or increase pain, minimal swelling.  Pt  denies any significant sore throat, dysphagia, itching, sneezing,  nasal congestion or excess secretions,  shaking chills, sweats, unintended wt loss, pleuritic or exertional cp, hempoptysis, change in activity tolerance  orthopnea pnd or leg swelling     Preventive Screening-Counseling & Management  Alcohol-Tobacco     Smoking Status: quit     Year Quit: 1970's  Current Medications (verified): 1)  Coumadin 3 Mg Tabs (Warfarin Sodium) .... Take As Directed 2)  Zantac 150 Mg  Tabs (Ranitidine Hcl) .... 2 At Bedtime 3)  Protonix 40 Mg Tbec (Pantoprazole Sodium) .... Take  One 30-60 Min Before First Meal of The Day 4)  Spironolactone-Hctz 25-25 Mg Tabs (Spironolactone-Hctz) .... 2 Tablets Once Daily 5)  Meclizine Hcl 25 Mg  Tabs (Meclizine Hcl) .... 1/2 To 1 Every 6 Hrs As Needed For Dizzinesss 6)  Propoxyphene N-Apap 100-650 Mg Tabs (Propoxyphene N-Apap) .Marland Kitchen.. 1 Every 6 Hours As Needed For Pain 7)  Mucinex Dm 30-600 Mg Xr12h-Tab (Dextromethorphan-Guaifenesin) .... 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) DIVERTICULOSIS, COLON (ICD-562.10) Internal hemorrhoids COUGH, CHRONIC (ICD-786.2) PVD (ICD-443.9) HYPERLIPIDEMIA (ICD-272.4) HYPERTENSION (ICD-401.9) Hyperkalemia on aldactone October 14, 2008  Melanoma,  skin graft 2003..............................................Marland KitchenTafeen    -  repeat skin graft 10/09 COPD OSA on CPAP GERD Health Maintenance ................................................................Marland KitchenWert   -  Td 4/06   -  Pneumovax 1998  Vital Signs:  Patient profile:   77 year old male Height:      67 inches Weight:      193.13 pounds BMI:     30.36 O2 Sat:      95 % on Room air Temp:     98.2 degrees F oral Pulse rate:   62 / minute BP sitting:   118 / 64  (left arm) Cuff size:    regular  Vitals Entered By: Reynaldo Minium CMA (July 22, 2010 1:50 PM)  O2 Flow:  Room air CC: Fell on Sunday-tear in skin on Right arm near elbow-redness and tender to the touch.   Physical Exam  Additional Exam:  very frail amb wm walks with cane  no longer getting up on exam ta ble  wt  169 October 14, 2008 > 196  March 02, 2010 > 188 May 13, 2010 > 191 June 02, 2010 > 193 July 22, 2010  HEENT:  Unremarkable.  Oropharynx is clear. LUNG FIELDS:  Coarse breath sounds with expiratory wheezes.  HEART:  There is a regular rhythm without murmur, gallop, rub. ABDOMEN:  Soft, benign. EXTREMITIES:  Warm without calf tenderness, abrasion/ skin tear x 8 cm prox Right forearm, mild sts and surrounding erthema, from elbow and no crepitance or woody induriation/ suppurance.    Impression & Recommendations:  Problem # 1:  ABRASION, INFECTED (ICD-919.1) Td is up to date, minimal cellulitic changes, minimal oozing despite coumadin  See instructions for specific recommendations   Medications Added to Medication List This Visit: 1)  Keflex 500 Mg Caps (Cephalexin) .... One twice daily  Other Orders: Est. Patient Level III (99213)  Patient Instructions: 1)  Keflex 500 one twice daily x 7 days 2)  Elevate and applly moderate warm heat 3)  If condition worsens go to ER Prescriptions: KEFLEX 500 MG CAPS (CEPHALEXIN) one twice daily  #14 x 0   Entered and Authorized by:   Michael B Wert MD   Signed by:   Michael B Wert MD on 07/22/2010   Method used:   Electronically to        [id number] (retail)       [address]       [place], [place]  [address]       Ph: [phone] or [phone]       Fax: [phone]   RxID:   [id number]

## 2011-01-05 NOTE — Miscellaneous (Signed)
Summary: Order/[facility]  Order/[facility]   Imported By: Lester [place] 09/14/2010 07:30:05  _____________________________________________________________________  External Attachment:    Type:   Image     Comment:   External Document

## 2011-01-05 NOTE — Medication Information (Signed)
Summary: Coumadin Clinic  Anticoagulant Therapy  Managed by: Bethena Midget, RN, BSN Referring MD: [name] PCP: Nolon Lennert MD: Graciela Husbands MD, Viviann Spare Indication 1: CVA-stroke (ICD-436) Lab Used: [facility] [place] Site: [address] PT 23.8 INR POC 2.4 INR RANGE 2 - 3  Dietary changes: no    Health status changes: no    Bleeding/hemorrhagic complications: no    Recent/future hospitalizations: no    Any changes in medication regimen? no    Recent/future dental: no  Any missed doses?: no       Is patient compliant with meds? yes      Comments: PER [facility] result.   Allergies: No Known Drug Allergies  Anticoagulation Management History:      His anticoagulation is being managed by telephone today.  Positive risk factors for bleeding include an age of 75 years or older.  The bleeding index is 'intermediate risk'.  Positive CHADS2 values include History of HTN and Age > 21 years old.  The start date was 07/06/1998.  Prothrombin time is 23.8.  Anticoagulation responsible provider: Graciela Husbands MD, Viviann Spare.  INR POC: 2.4.    Anticoagulation Management Assessment/Plan:      The patient's current anticoagulation dose is Coumadin 3 mg tabs: take as directed.  The target INR is 2 - 3.  The next INR is due 04/07/2010.  Anticoagulation instructions were given to patient.  Results were reviewed/authorized by Bethena Midget, RN, BSN.  He was notified by Bethena Midget, RN, BSN.         Prior Anticoagulation Instructions: INR 2.2 Continue 3mg s daily except 6mg s on Wed and Fri. Order sent to [facility] for repeat in one week.   Current Anticoagulation Instructions: INR 2.4 Continue 3mg s daily except 6mg s Wed and Friday.  REcheck in 4 weeks.

## 2011-01-05 NOTE — Progress Notes (Signed)
Summary: protonix needs PA > change to omeprazole 20mg   Phone Note Call from Patient Call back at [phone]   Caller: Son//rod Call For: wert Reason for Call: Refill Medication Summary of Call: Needs prior autho for refill on protonix 40mg , the number to call is [phone]. Also wants to change from [facility] [place] to [facility]. Initial call taken by: Darletta Moll,  August 13, 2010 9:39 AM  Follow-up for Phone Call        called spoke with patient's son, states pharmacy told him that the protonix needs PA.  per pt's EMR chart, these are the alternatives:  omeprazole 10mg , 20mg , 40mg ; nexium 40mg ; prilosec 20mg .  may patient be changed to one of these covered meds?  would like this new rx sent to [facility]. Boone Master CNA/MA  August 13, 2010 11:06 AM   omeprazole 20 is fine dosed Take  one 30-60 min before first meal of the day  Follow-up by: Nyoka Cowden MD,  August 13, 2010 12:13 PM  Additional Follow-up for Phone Call Additional follow up Details #1::        called LM on VM informing Rod of change in pt's meds per his request.  med changed on med list and refills sent [facility]  [facility]. Boone Master CNA/MA  August 13, 2010 12:25 PM     New/Updated Medications: OMEPRAZOLE 20 MG CPDR (OMEPRAZOLE) 1 capsule by mouth 30-60 minutes before first meal of the day Prescriptions: OMEPRAZOLE 20 MG CPDR (OMEPRAZOLE) 1 capsule by mouth 30-60 minutes before first meal of the day  #30 x 6   Entered by:   Boone Master CNA/MA   Authorized by:   Nyoka Cowden MD   Signed by:   Boone Master CNA/MA on 08/13/2010   Method used:   Electronically to        [facility]* (retail)       [address]       [place], [place]  [id number]       Ph: [phone]       Fax: [phone]   RxID:   [id number]

## 2011-01-05 NOTE — Assessment & Plan Note (Signed)
Summary: Primary svc/ f/u ov > try meloxicam for knee pain   Primary Provider/Referring Provider:  [name]  CC:  Followup.  Pt c/o left leg pain and weakness x 3 wks.  States that it is hard for him to get up from sitting in a chair.  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  He has been maintained on Coumadin since then and mainly complains of intermittent dizziness dating all the way back to his stroke which is controlled on p.r.n. meclizine.  He is followed here also for hypertension and chronic leg swelling. l> r  seen 04/19/08 for fu ok except ? recurrent melanoma left leg  August 26, 2008  cleared for melanoma surgery by Cards   October 14, 2008 ov p skin graft with weaping clear fluid worse as day goes on. no fever or pain or erythema of wound or purulent drainage cp or orhtopnea/ pnd/cp/sob.  November 12, 2009 cc Yearly followup.  Pt c/o diarrhea x 2 months.  Denies any other complaints - no nocturnal symptoms, using metamucil ? why.  no cramps or wt loss.  March 02, 2010 Pt here for 2 month follow up.  Pt c/o neck swelling-states this has gotten worse over the past year.  Pt also requesting rx for PT and rx for warfarin and ranitidine.     May 13, 2010 ov  cc cough off and on for 5 weeks  with white sputum.  He states that he has noticed wheezing for the past few days.  some nasal congestion and sneezing.   swallowing worse lately esp mucinex dm. rec change zantac bedtime, protonix before bfast and diet.  June 02, 2010 1 month followup.  Pt states that his chest congestion has resolved.  Still has occ cough that his non prod.  He c/o not being able to move his left thumb intermittent cramps up.  Pt is currently on cephalexin 500 mg  for bacterial infection per Tafeen.    August 14 th fell and tore skin R Elbow  July 22, 2010 ov NH nurse concerned that developing cellulitis at abrasion site/ right forearm.  no  fever or increase pain, minimal swelling.  --   July 29, 2010--Presents for follow up for right arm cellulitis.  Was seen 1 week ago, tx for for abrasion right forearm, was given keflex - finished this morning.  states no better and has increased warmth x2days. The skin is very thin, nurses are apply a clear dressing but removing it pulls at skin. no fever, confusion, or increased drainage. Denies chest pain,  orthopnea, hemoptysis, fever, n/v/d, edema, headache.   August 12, 2010--Pt returns for follow up . Seen 2 weks ago for slow to heal right arm cellulitis, wound cx showed ENTEROBACTER CLOACAE. Tx w/ Levaquin. Returns today wound is much improved. w/ healed sore and redness/swelling is almost gone. Yesterday hit left elbow w/ small abrasion. no significant redness. complains that his left knee has been hurting . no swelling or redness. Has not used any pain meds. Finish Levaquin.   Alternate ice and Warm heat to knee Tylenol arthritis as needed for pain- go by the bottle instructions.  Elevate and rest  August 25, 2010 Followup.  Pt c/o left leg pain and weakness x 3 wks.  States that it is hard for him to get up from sitting in a chair more due to pain in left  knee than due to weakness.  no swelling in L leg, no numbness.  Pt denies any significant sore throat, dysphagia, itching, sneezing,  nasal congestion or excess secretions,  fever, chills, sweats, unintended wt loss, pleuritic or exertional cp, hempoptysis, change in activity tolerance  orthopnea pnd    Current Medications (verified): 1)  Coumadin 3 Mg Tabs (Warfarin Sodium) .... Take As Directed 2)  Zantac 150 Mg  Tabs (Ranitidine Hcl) .... 2 At Bedtime 3)  Prilosec Otc 20 Mg Tbec (Omeprazole Magnesium) .... Take 1 Capsule 30-60 Minutes Before First Meal of The Day. 4)  Spironolactone-Hctz 25-25 Mg Tabs (Spironolactone-Hctz) .... 2 Tablets Once Daily 5)  Meclizine Hcl 25 Mg  Tabs (Meclizine Hcl) .... 1/2 To 1 Every 6 Hrs As  Needed For Dizzinesss 6)  Mucinex Dm 30-600 Mg Xr12h-Tab (Dextromethorphan-Guaifenesin) .... 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) CVA SINUS BRADYCARDIA (ICD-427.81) DIVERTICULOSIS, COLON (ICD-562.10) Internal hemorrhoids COUGH, CHRONIC (ICD-786.2) PVD (ICD-443.9) HYPERLIPIDEMIA (ICD-272.4) HYPERTENSION (ICD-401.9)    - breast tenderness on aldactone August 25, 2010 > try reduce to one daily Hyperkalemia on aldactone October 14, 2008  Melanoma,  skin graft 2003..............................................[name] KitchenTafeen    - repeat skin graft 10/09 COPD OSA on CPAP GERD Health Maintenance ................................................................[name] KitchenWert   -  Td 4/06   -  Pneumovax 1998  Vital Signs:  Patient profile:   77 year old male Weight:      193 pounds O2 Sat:      97 % on Room air Temp:     98.1 degrees F oral Pulse rate:   62 / minute BP sitting:   110 / 60  (left arm)  Vitals Entered By: Vernie Murders (August 25, 2010 10:21 AM)  O2 Flow:  Room air  Physical Exam  Additional Exam:  very frail amb wm walks with cane  no longer getting up on exam ta ble  wt  169 October 14, 2008  > 191 June 02, 2010 > 193 July 22, 2010  > 193 August 25, 2010  HEENT:  Unremarkable.  Oropharynx is clear. LUNG FIELDS:  Coarse breath sounds w/ no wheezing.  HEART:  There is a regular rhythm without murmur, gallop, rub. No edema ABDOMEN:  Soft, benign. EXTREMITIES:  Warm without calf tenderness,   MS  Minimal crepitance L knee, no effusion, tenderness/ erythema   Impression & Recommendations:  Problem # 1:  KNEE PAIN (ICD-719.46)  Likely DJD, no evidence dvt/ cva or claudication.  Try meloxicam first, orthopedic eval next  Orders: Est. Patient Level III (11914) Prescription Created Electronically [phone])  Problem # 2:  HYPERTENSION (ICD-401.9)  His  updated medication list for this problem includes:    Spironolactone-hctz 25-25 Mg Tabs (Spironolactone-hctz) ..... Once daily  ok on rx - breast tenderness prob from aldactone, try taper and ? stop next ov and convert to Maxzide ?  Orders: Est. Patient Level III (62130)  Medications Added to Medication List This Visit: 1)  Spironolactone-hctz 25-25 Mg Tabs (Spironolactone-hctz) .... Once daily 2)  Meloxicam 7.5 Mg Tabs (Meloxicam) .... One twice daily with meals as needed for knee pain  Patient Instructions: 1)  Meloxicam 7.5 mg one twice daily with meals if needed for arthritis Reduce spironolactone to once daily 2)  and if not improving call for orthopedic referral 3)  Please schedule a follow-up appointment in 6 weeks, sooner if needed  Prescriptions:  MELOXICAM 7.5 MG TABS (MELOXICAM) one twice daily with meals as needed for knee pain  #30 x 11   Entered and Authorized by:   Nyoka Cowden MD   Signed by:   Nyoka Cowden MD on 08/25/2010   Method used:   Electronically to        [facility]* (retail)       [address]       [place], [place]  [id number]       Ph: [phone]       Fax: [phone]   RxID:   [id number]

## 2011-01-05 NOTE — Progress Notes (Signed)
Summary: recent fall   Phone Note From Other Clinic   [facility]er[facility] Call For: wert Summary of Call: pt fell "over the weekend". skin is torn over L elbow. red, warm areas toward wrist. pt prone to cellulitus. rx request. [facility] [address] melisa # [phone] Initial call taken by: Tivis Ringer, CNA,  July 21, 2010 3:18 PM  Follow-up for Phone Call        [facility].  Arman Filter LPN  July 21, 2010 3:21 PM    Spoke with Melissa-cellulitis (?) warm and tender to the touch at the elbow; skin is torn. Requests abx before things get worse. Please advise needs ov or go to ER  Allergies: NKDA .Reynaldo Minium CMA  July 22, 2010 9:40 AM  Follow-up by: Nyoka Cowden MD,  July 22, 2010 12:18 PM  Additional Follow-up for Phone Call Additional follow up Details #1::        Efraim Kaufmann will let pt know and decide from there; will call the office if wants OV instead of ER.Reynaldo Minium CMA  July 22, 2010 12:23 PM

## 2011-01-05 NOTE — Assessment & Plan Note (Signed)
Summary: NP follow up - right arm cellulitis   Primary Provider/Referring Provider:  [name]  CC:  follow up right forearm cellulitis - resolved.  will finish levaquin tomorrow .  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  He has been maintained on Coumadin since then and mainly complains of intermittent dizziness dating all the way back to his stroke which is controlled on p.r.n. meclizine.  He is followed here also for hypertension and chronic leg swelling. l> r  seen 04/19/08 for fu ok except ? recurrent melanoma left leg  August 26, 2008  cleared for melanoma surgery by Cards   October 14, 2008 ov p skin graft with weaping clear fluid worse as day goes on. no fever or pain or erythema of wound or purulent drainage cp or orhtopnea/ pnd/cp/sob.  November 12, 2009 cc Yearly followup.  Pt c/o diarrhea x 2 months.  Denies any other complaints - no nocturnal symptoms, using metamucil ? why.  no cramps or wt loss.  March 02, 2010 Pt here for 2 month follow up.  Pt c/o neck swelling-states this has gotten worse over the past year.  Pt also requesting rx for PT and rx for warfarin and ranitidine.     May 13, 2010 ov  cc cough off and on for 5 weeks  with white sputum.  He states that he has noticed wheezing for the past few days.  some nasal congestion and sneezing.   swallowing worse lately esp mucinex dm. rec change zantac bedtime, protonix before bfast and diet.  June 02, 2010 1 month followup.  Pt states that his chest congestion has resolved.  Still has occ cough that his non prod.  He c/o not being able to move his left thumb intermittent cramps up.  Pt is currently on cephalexin 500 mg  for bacterial infection per Tafeen.    August 14 th fell and tore skin R Elbow  July 22, 2010 ov NH nurse concerned that developing cellulitis at abrasion site/ right forearm.  no fever or increase pain, minimal swelling.  --   July 29, 2010--Presents for follow up for right arm cellulitis.  Was seen 1 week ago, tx for for abrasion right forearm, was given keflex - finished this morning.  states no better and has increased warmth x2days. The skin is very thin, nurses are apply a clear dressing but removing it pulls at skin. no fever, confusion, or increased drainage. Denies chest pain,  orthopnea, hemoptysis, fever, n/v/d, edema, headache.   August 12, 2010--Pt returns for follow up . Seen 2 weks ago for slow to heal right arm cellulitis, wound cx showed ENTEROBACTER CLOACAE. Tx w/ Levaquin. Returns today wound is much improved. w/ healed sore and redness/swelling is almost gone. Yesterday hit left elbow w/ small abrasion. no significant redness. complains that his left knee has been hurting . no swelling or redness. Has not used any pain meds. Denies chest pain, dyspnea, orthopnea, hemoptysis, fever, n/v/d, edema, headache,rash. He has had previous stroke w/ right sided hemiparesis and uses cane on right.   Medications Prior to Update: 1)  Coumadin 3 Mg Tabs (Warfarin Sodium) .... Take As Directed 2)  Zantac 150 Mg  Tabs (Ranitidine Hcl) .... 2 At Bedtime 3)  Protonix 40 Mg Tbec (Pantoprazole Sodium) .... Take  One 30-60 Min Before First Meal of The Day 4)  Spironolactone-Hctz 25-25 Mg Tabs (Spironolactone-Hctz) .... 2  Tablets Once Daily 5)  Meclizine Hcl 25 Mg  Tabs (Meclizine Hcl) .... 1/2 To 1 Every 6 Hrs As Needed For Dizzinesss 6)  Propoxyphene N-Apap 100-650 Mg Tabs (Propoxyphene N-Apap) .Marland Kitchen.. 1 Every 6 Hours As Needed For Pain 7)  Mucinex Dm 30-600 Mg Xr12h-Tab (Dextromethorphan-Guaifenesin) .... 2 Two Times A Day As Needed 8)  Levaquin 500 Mg Tabs (Levofloxacin) .... Take 1 Tablet By Mouth Once A Day X10days  Current Medications (verified): 1)  Coumadin 3 Mg Tabs (Warfarin Sodium) .... Take As Directed 2)  Zantac 150 Mg  Tabs (Ranitidine Hcl) .... 2 At Bedtime 3)  Protonix 40 Mg Tbec (Pantoprazole Sodium) .... Take   One 30-60 Min Before First Meal of The Day 4)  Spironolactone-Hctz 25-25 Mg Tabs (Spironolactone-Hctz) .... 2 Tablets Once Daily 5)  Meclizine Hcl 25 Mg  Tabs (Meclizine Hcl) .... 1/2 To 1 Every 6 Hrs As Needed For Dizzinesss 6)  Propoxyphene N-Apap 100-650 Mg Tabs (Propoxyphene N-Apap) .Marland Kitchen.. 1 Every 6 Hours As Needed For Pain 7)  Mucinex Dm 30-600 Mg Xr12h-Tab (Dextromethorphan-Guaifenesin) .... 2 Two Times A Day As Needed 8)  Levaquin 500 Mg Tabs (Levofloxacin) .... Take 1 Tablet By Mouth Once A Day X10days  Allergies (verified): No Known Drug Allergies  Past History:  Past Medical History: Last updated: 06/02/2010 BLINDNESS, ONE EYE (ICD-369.60) CVA SINUS BRADYCARDIA (ICD-427.81) DIVERTICULOSIS, COLON (ICD-562.10) Internal hemorrhoids COUGH, CHRONIC (ICD-786.2) PVD (ICD-443.9) HYPERLIPIDEMIA (ICD-272.4) HYPERTENSION (ICD-401.9) Hyperkalemia on aldactone October 14, 2008  Melanoma,  skin graft 2003..............................................Marland KitchenTafeen    - repeat skin graft 10/09 COPD OSA on CPAP GERD Health Maintenance ................................................................Marland KitchenWert   -  Td 4/06   -  Pneumovax 1998  Past Surgical History: Last updated: 08/19/2008 Melanoma on left lower leg removed and skin grafting Hernia Surgery  Family History: Last updated: 08/15/2008 Family History of Heart Disease: Father No FH of Colon Cancer  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  Alcohol Use - no Daily Caffeine Use <1 Illicit Drug Use - no  Risk Factors: Smoking Status: quit (07/22/2010)  Review of Systems      See HPI  Vital Signs:  Patient profile:   77 year old male Height:      67 inches Weight:      190 pounds O2 Sat:      94 % on Room air Temp:     97.2 degrees F oral Pulse rate:   65 / minute BP sitting:   102 / 74  (left arm) Cuff size:   regular  Vitals Entered By: Boone Master CNA/MA  (August 12, 2010 11:52 AM)  O2 Flow:  Room air CC: follow up right forearm cellulitis - resolved.  will finish levaquin tomorrow  Is Patient Diabetic? No Comments Medications reviewed with patient Daytime contact number verified with patient. Boone Master CNA/MA  August 12, 2010 11:53 AM    Physical Exam  Additional Exam:  very frail amb wm walks with cane  no longer getting up on exam ta ble  wt  169 October 14, 2008 > 196  March 02, 2010 > 188 May 13, 2010 > 191 June 02, 2010 > 193 July 22, 2010 >190>190 August 12 2010  HEENT:  Unremarkable.  Oropharynx is clear. LUNG FIELDS:  Coarse breath sounds w/ no wheezing.  HEART:  There is a regular rhythm without murmur, gallop, rub. ABDOMEN:  Soft, benign. EXTREMITIES:  Warm without calf tenderness, abrasion/  skin tea on left elbow w/ steristrips in place. right forearm w/ significant healing,redness resolved, edema is only trace.     Impression & Recommendations:  Problem # 1:  ABRASION, INFECTED (ICD-919.1)  Right forearm cellulitis is resolved w/ abx.  cont to monitor  clean left elbow w/ soap and water and cover w/ neosporin /bandage.   Orders: Est. Patient Level III (16109)  Problem # 2:  KNEE PAIN (ICD-719.46)  will tx conservatively tylenol arthritis as needed  ice and heeat rest and elevation  Please contact office for sooner follow up if symptoms do not improve or worsen   Orders: Est. Patient Level III (60454)  Complete Medication List: 1)  Coumadin 3 Mg Tabs (Warfarin sodium) .... Take as directed 2)  Zantac 150 Mg Tabs (Ranitidine hcl) .... 2 at bedtime 3)  Protonix 40 Mg Tbec (Pantoprazole sodium) .... Take  one 30-60 min before first meal of the day 4)  Spironolactone-hctz 25-25 Mg Tabs (Spironolactone-hctz) .... 2 tablets once daily 5)  Meclizine Hcl 25 Mg Tabs (Meclizine hcl) .... 1/2 to 1 every 6 hrs as needed for dizzinesss 6)  Mucinex Dm 30-600 Mg Xr12h-tab (Dextromethorphan-guaifenesin)  .... 2 two times a day as needed 7)  Levaquin 500 Mg Tabs (Levofloxacin) .... Take 1 tablet by mouth once a day x10days  Patient Instructions: 1)  Finish Levaquin.  2)   Please contact office for sooner follow up if symptoms do not improve or worsen  3)  Alternate ice and Warm heat to knee 4)  Tylenol arthritis as needed for pain- go by the bottle instructions.  5)  Elevate and rest .

## 2011-01-05 NOTE — Miscellaneous (Signed)
[facility]   Im[facility] By: Marylou Mccoy 03/31/2010 10:10:47  _____________________________________________________________________  External Attachment:    Type:   Image     Comment:   External Document

## 2011-01-05 NOTE — Medication Information (Signed)
Summary: Coumadin Clinic  Anticoagulant Therapy  Managed by: Bethena Midget, RN, BSN Referring MD: [name] PCP: Nolon Lennert MD: Excell Seltzer MD, Casimiro Needle Indication 1: CVA-stroke (ICD-436) Lab Used: [facility] [address] Site: [address] PT 33.7 INR POC 3.4 INR RANGE 2 - 3  Dietary changes: no    Health status changes: no    Bleeding/hemorrhagic complications: no    Recent/future hospitalizations: no    Any changes in medication regimen? yes       Details: mucinex PRN   Recent/future dental: no  Any missed doses?: no       Is patient compliant with meds? yes       Allergies: No Known Drug Allergies  Anticoagulation Management History:      His anticoagulation is being managed by telephone today.  Positive risk factors for bleeding include an age of 76 years or older.  The bleeding index is 'intermediate risk'.  Positive CHADS2 values include History of HTN and Age > 51 years old.  The start date was 07/06/1998.  Prothrombin time is 33.7.  Anticoagulation responsible provider: Excell Seltzer MD, Casimiro Needle.  INR POC: 3.4.    Anticoagulation Management Assessment/Plan:      The patient's current anticoagulation dose is Coumadin 3 mg tabs: take as directed.  The target INR is 2 - 3.  The next INR is due 11/09/2010.  Anticoagulation instructions were given to patient.  Results were reviewed/authorized by Bethena Midget, RN, BSN.  He was notified by Bethena Midget, RN, BSN.         Prior Anticoagulation Instructions: INR 3.2  Attempted to call pt with results.  LMOM TCB. Cloyde Reams RN  October 05, 2010 11:01 AM    Spoke with pt.  Skip tomorrow's dose of Coumadin then resume same dose of 1 tablet every day except 2 tablets on Friday.  Recheck INR in 3 weeks.    Current Anticoagulation Instructions: INR 3.4 Skip tomorrow's  dose then change dose to 3mg s daily. Recheck in 2 weeks.

## 2011-01-05 NOTE — Assessment & Plan Note (Signed)
Summary: Primary svc/ eval cough   Primary Provider/Referring Provider:  [name]  CC:  Acute visit.  Pt c/o cough x 10 days with white sputum.  He states that he has noticed wheezing for the past few days.  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  He has been maintained on Coumadin since then and mainly complains of intermittent dizziness dating all the way back to his stroke which is controlled on p.r.n. meclizine.  He is followed here also for hypertension and chronic leg swelling. l> r  seen 04/19/08 for fu ok except ? recurrent melanoma left leg  August 26, 2008  cleared for melanoma surgery by Cards   October 14, 2008 ov p skin graft with weaping clear fluid worse as day goes on. no fever or pain or erythema of wound or purulent drainage cp or orhtopnea/ pnd/cp/sob.  November 12, 2009 cc Yearly followup.  Pt c/o diarrhea x 2 months.  Denies any other complaints - no nocturnal symptoms, using metamucil ? why.  no cramps or wt loss.  March 02, 2010 Pt here for 2 month follow up.  Pt c/o neck swelling-states this has gotten worse over the past year.  Pt also requesting rx for PT and rx for warfarin and ranitidine.     May 13, 2010 ov  cc cough off and on for 5 weeks  with white sputum.  He states that he has noticed wheezing for the past few days.  some nasal congestion and sneezing.   swallowing worse lately esp mucinex dm. Pt denies any significant sore throat, dysphagia, itching, sneezing,  nasal congestion or excess secretions,  fever, chills, sweats, unintended wt loss, pleuritic or exertional cp, hempoptysis, change in activity tolerance  orthopnea pnd or leg swelling. Pt also denies any obvious fluctuation in symptoms with weather or environmental change or other alleviating or aggravating factors.       Current Medications (verified): 1)  Coumadin 3 Mg Tabs (Warfarin Sodium) .... Take As Directed 2)  Zantac  150 Mg  Tabs (Ranitidine Hcl) .... One After Breakfast and One At Bedtime 3)  Meclizine Hcl 25 Mg  Tabs (Meclizine Hcl) .... 1/2 To 1 Every 6 Hrs As Needed For Dizzinesss 4)  Spironolactone-Hctz 25-25 Mg Tabs (Spironolactone-Hctz) .... 2 Tablets Once Daily 5)  Propoxyphene N-Apap 100-650 Mg Tabs (Propoxyphene N-Apap) .Marland Kitchen.. 1 Every 6 Hours As Needed For Pain 6)  Mucinex Dm 30-600 Mg Xr12h-Tab (Dextromethorphan-Guaifenesin) .... 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) CVA SINUS BRADYCARDIA (ICD-427.81) DIVERTICULOSIS, COLON (ICD-562.10) Internal hemorrhoids COUGH, CHRONIC (ICD-786.2) PVD (ICD-443.9) HYPERLIPIDEMIA (ICD-272.4) HYPERTENSION (ICD-401.9) Hyperkalemia on aldactone October 14, 2008  Melanoma,  skin graft 2003    - repeat skin graft 10/09 COPD OSA on CPAP GERD Health Maintenance ................................................................Marland KitchenWert   -  Td 4/06   -  Pneumovax 1998  Vital Signs:  Patient profile:   77 year old male Weight:      188 pounds BMI:     29.55 O2 Sat:      96 % on Room air Temp:     97.7 degrees F oral Pulse rate:   81 / minute BP sitting:   116 / 74  (left arm)  Vitals Entered By: Vernie Murders (May 13, 2010 2:14 PM)  O2 Flow:  Room  air  Physical Exam  Additional Exam:  very frail amb wm walks with cane  needs two person assist to get on exam table  wt 170 > 169 October 14, 2008 > 196  March 02, 2010 > 188 May 13, 2010  HEENT:  Unremarkable.  Oropharynx is clear. LUNG FIELDS:  Coarse breath sounds with expiratory wheezes.  HEART:  There is a regular rhythm without murmur, gallop, rub. ABDOMEN:  Soft, benign. EXTREMITIES:  Warm without calf tenderness, cyanosis, clubbing,  trace edema bilaterally   CXR  Procedure date:  05/13/2010  Findings:      Comparison: Chest x-ray 08/30/2008   Findings: The heart is borderline  enlarged but stable.  There is mild tortuosity, ectasia and calcification of the thoracic aorta. There are chronic lung changes/COPD and left basilar scarring.  No infiltrates, edema or effusions.  Remote healed right rib fractures are noted.   IMPRESSION: Chronic lung changes/COPD.  No definite acute overlying pulmonary process.  Impression & Recommendations:  Problem # 1:  COUGH, CHRONIC (ICD-786.2) The most common causes of chronic cough in immunocompetent adults include: upper airway cough syndrome (UACS), previously referred to as postnasal drip syndrome,  caused by variety of rhinosinus conditions; (2) asthma; (3) GERD; (4) chronic bronchitis from cigarette smoking or other inhaled environmental irritants; (5) nonasthmatic eosinophilic bronchitis; and (6) bronchiectasis. These conditions, singly or in combination, have accounted for up to 94% of the causes of chronic cough in prospective studies.  Most likely this is some form of GERD or aspiration.  Note increase dysphagia esp for big pills like mucinex.  Each maintenance medication was reviewed in detail including most importantly the difference between maintenance prns and under what circumstances the prns are to be used.  In addition, these two groups (for which the patient should keep up with refills) were distinguished from a third group :  meds that are used only short term with the intent to complete a course of therapy and then not refill them.  The med list was then fully reconciled and reorganized to reflect this important distinction.   Problem # 2:  HYPERTENSION (ICD-401.9)  His updated medication list for this problem includes:    Spironolactone-hctz 25-25 Mg Tabs (Spironolactone-hctz) .Marland Kitchen... 2 tablets once daily  ok on rx  Medications Added to Medication List This Visit: 1)  Zantac 150 Mg Tabs (Ranitidine hcl) .... 2 at bedtime 2)  Protonix 40 Mg Tbec (Pantoprazole sodium) .... Take  one 30-60 min before first meal of  the day 3)  Mucinex Dm 30-600 Mg Xr12h-tab (Dextromethorphan-guaifenesin) .... 2 two times a day as needed 4)  Prednisone 10 Mg Tabs (Prednisone) .... 4 each am x 2days, 2x2days, 1x2days and stop  Other Orders: T-2 View CXR (71020TC) Prescription Created Electronically [phone]) Est. Patient Level IV (98119)  Patient Instructions: 1)  Change zantac to 150 mg take two at bedtime 2)  Protonix 40 mg Take  one 30-60 min before first meal of the day 3)  Prednisone 10 mg 4 each am x 2days, 2x2days, 1x2days and stop  4)  Use robitussin dm instead of mucindex dm 5)  Swallowing eval may be the next step 6)  GERD (REFLUX)  is a common cause of respiratory symptoms. It commonly presents without heartburn and can be treated with medication, but also with lifestyle changes including avoidance of late meals, excessive alcohol, smoking cessation, and avoid fatty foods, chocolate, peppermint, colas, red wine, and acidic juices such as orange juice.  NO MINT OR MENTHOL PRODUCTS SO NO COUGH DROPS  7)  USE SUGARLESS CANDY INSTEAD (jolley ranchers)  8)  NO OIL BASED VITAMINS  9)  Keep previous appt unless condition worsens Prescriptions: PREDNISONE 10 MG  TABS (PREDNISONE) 4 each am x 2days, 2x2days, 1x2days and stop  #14 x 0   Entered and Authorized by:   Nyoka Cowden MD   Signed by:   Nyoka Cowden MD on 05/13/2010   Method used:   Electronically to        [id number]* (retail)       [address]       [place], [place]  [address]       Ph: [phone]       Fax: [phone]   RxID:   [id number] PROTONIX 40 MG TBEC (PANTOPRAZOLE SODIUM) Take  one 30-60 min before first meal of the day  #34 x 11   Entered and Authorized by:   Nyoka Cowden MD   Signed by:   Nyoka Cowden MD on 05/13/2010   Method used:   Electronically to        [id number]* (retail)       [address]       [place], [place]  [address]       Ph: [phone]       Fax:  [phone]   RxID:   [id number]    CXR  Procedure date:  05/13/2010  Findings:      Comparison: Chest x-ray 08/30/2008   Findings: The heart is borderline enlarged but stable.  There is mild tortuosity, ectasia and calcification of the thoracic aorta. There are chronic lung changes/COPD and left basilar scarring.  No infiltrates, edema or effusions.  Remote healed right rib fractures are noted.   IMPRESSION: Chronic lung changes/COPD.  No definite acute overlying pulmonary process.

## 2011-01-05 NOTE — Medication Information (Signed)
Summary: Coumadin Clinic   Anticoagulant Therapy  Managed by: Weston Brass, PharmD Referring MD: [name] PCP: Nolon Lennert MD: Excell Seltzer MD, Casimiro Needle Indication 1: CVA-stroke (ICD-436) Lab Used: [facility] Site: [address] INR POC 3.1 INR RANGE 2 - 3  Dietary changes: no    Health status changes: yes       Details: pt feeling dizzy.  RN at [facility] checked BP and was okay today  Bleeding/hemorrhagic complications: no    Recent/future hospitalizations: no    Any changes in medication regimen? no    Recent/future dental: no  Any missed doses?: no       Is patient compliant with meds? yes      Comments: Pt already took dose for today  Allergies: No Known Drug Allergies  Anticoagulation Management History:      His anticoagulation is being managed by telephone today.  Positive risk factors for bleeding include an age of 75 years or older.  The bleeding index is 'intermediate risk'.  Positive CHADS2 values include History of HTN and Age > 39 years old.  The start date was 07/06/1998.  Anticoagulation responsible provider: Excell Seltzer MD, Casimiro Needle.  INR POC: 3.1.    Anticoagulation Management Assessment/Plan:      The patient's current anticoagulation dose is Coumadin 3 mg tabs: take as directed.  The target INR is 2 - 3.  The next INR is due 09/14/2010.  Anticoagulation instructions were given to patient.  Results were reviewed/authorized by Weston Brass, PharmD.  He was notified by Weston Brass PharmD.         Prior Anticoagulation Instructions: INR 2.7 Resume 3mg s everyday except 6mg s on Fridays. Recheck in 2 weeks.   Current Anticoagulation Instructions: INR 3.1  Spoke with pt.  Skip tomorrow's dose of Coumadin then resume same dose of 1 tablet every day except 2 tablets on Friday.  Recheck INR in 2 weeks.  Orders faxed to [facility].

## 2011-01-05 NOTE — Miscellaneous (Signed)
Summary: Omeprazole changed to Prilosec OTC  Medications Added PRILOSEC OTC 20 MG TBEC (OMEPRAZOLE MAGNESIUM) take 1 capsule 30-60 minutes before first meal of the day.       Clinical Lists Changes  Medications: Changed medication from OMEPRAZOLE 20 MG CPDR (OMEPRAZOLE) 1 capsule by mouth 30-60 minutes before first meal of the day to PRILOSEC OTC 20 MG TBEC (OMEPRAZOLE MAGNESIUM) take 1 capsule 30-60 minutes before first meal of the day.      Received request from [facility] to change omeprazole to priolec OTC d/t pt insurance not covering omperazole but will pay for Priolec OTC.  Ok per MW to change.  Request faxed back to [facility].  Pt aware of this change.  Gweneth Dimitri RN  August 14, 2010 2:20 PM

## 2011-01-05 NOTE — Miscellaneous (Signed)
Summary: d/c OT order/[facility]  d/c OT order/[facility]   Imported By: Sherian Rein 10/19/2010 08:50:21  _____________________________________________________________________  External Attachment:    Type:   Image     Comment:   External Document

## 2011-01-05 NOTE — Progress Notes (Signed)
Summary: Off Coumadin for Surgery   Phone Note Call from Patient   Caller: Patient Call For: Coumadin Clinic  Summary of Call: PT called.  He has a epidural injection scheduled for 4/15.  He has been instructed to stop his Coumadin on 4/10.   He was instructed to restart his Coumadin at his normal dose when okay with MD doing his procedure.  Will send an order to [facility] to recheck INR on 4/25.  Initial call taken by: Weston Brass PharmD,  March 13, 2010 5:19 PM

## 2011-01-07 NOTE — Assessment & Plan Note (Signed)
Summary: NP office visit -    Primary Provider/Referring Provider:  [name]  CC:  4 week follow up - states he stopped the lasix x2weeks b/c "it was too potent".  would also like a note for therapy for his right hand..  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  He has been maintained on Coumadin since then and mainly complains of intermittent dizziness dating all the way back to his stroke which is controlled on p.r.n. meclizine.  He is followed here also for hypertension and chronic leg swelling. l> r  seen 04/19/08 for fu ok except ? recurrent melanoma left leg  August 26, 2008  cleared for melanoma surgery by Cards   October 14, 2008 ov p skin graft with weaping clear fluid worse as day goes on. no fever or pain or erythema of wound or purulent drainage cp or orhtopnea/ pnd/cp/sob.   May 13, 2010 ov  cc cough off and on for 5 weeks  with white sputum.  He states that he has noticed wheezing for the past few days.  some nasal congestion and sneezing.   swallowing worse lately esp mucinex dm. rec change zantac bedtime, protonix before bfast and diet.  June 02, 2010 cc cough and chest congestion has resolved.  Still has occ cough that his non prod.  He c/o not being able to move his left thumb intermittent cramps up.  Pt is currently on cephalexin 500 mg  for bacterial infection per Tafeen.    August 14 th fell and tore skin R Elbow  July 22, 2010 ov NH nurse concerned that developing cellulitis at abrasion site/ right forearm.  no fever or increase pain, minimal swelling.  --  July 29, 2010--Presents for follow up for right arm cellulitis.  Was seen 1 week ago, tx for for abrasion right forearm, was given keflex - finished this morning.  states no better and has increased warmth x2days. The skin is very thin, nurses are apply a clear dressing but removing it pulls at skin. no fever, confusion, or increased drainage.     August 12, 2010-  cc slow to heal right arm cellulitis, wound cx showed ENTEROBACTER CLOACAE. Tx w/ Levaquin. Returns today wound is much improved. w/ healed sore and redness/swelling is almost gone. Yesterday hit left elbow w/ small abrasion. no significant redness. complains that his left knee has been hurting . no swelling or redness. Has not used any pain meds. Finish Levaquin.   Alternate ice and Warm heat to knee Tylenol arthritis as needed for pain- go by the bottle instructions.  Elevate and rest  October 28, 2010 ov cc leg pain no change, not using meloxicam as per instructions , also now c/o gynecomastia and tenderness gradually worse x 2 months= bilaterally while on spironoctone chronically which has controlled his edema and tendency to hbp well. no nipple discharge or pain.     December 02, 2010--Presents for a 4 week follow up.  Last ov spironolactone was stopped due to gynecomastia. He was started on Lasix however he stopped this 2 weeks ago becuase it makes him urinate too much. We discussed several options on diuretics and explained that the spironolactone can cause gynecomastia.  Would also like a note for therapy for his right hand.  Previous therapy in past has helped. Denies chest pain, orthopnea, hemoptysis, fever, n/v/d, edema, headache.        Medications Prior to  Update: 1)  Coumadin 3 Mg Tabs (Warfarin Sodium) .... Take As Directed 2)  Zantac 150 Mg  Tabs (Ranitidine Hcl) .... 2 At Bedtime 3)  Lasix 40 Mg  Tabs (Furosemide) .... One Daily 4)  Prilosec Otc 20 Mg Tbec (Omeprazole Magnesium) .... Take 1 Capsule 30-60 Minutes Before First Meal of The Day. 5)  Meclizine Hcl 25 Mg  Tabs (Meclizine Hcl) .... 1/2 To 1 Every 6 Hrs As Needed For Dizzinesss 6)  Mucinex Dm 30-600 Mg Xr12h-Tab (Dextromethorphan-Guaifenesin) .... 2 Two Times A Day As Needed 7)  Meloxicam 7.5 Mg Tabs (Meloxicam) .... One Twice Daily With Meals As Needed For Knee Pain 8)  Tylenol 325 Mg Tabs  (Acetaminophen) .... Per Bottle Directions As Needed  Current Medications (verified): 1)  Coumadin 3 Mg Tabs (Warfarin Sodium) .... Take As Directed 2)  Zantac 150 Mg  Tabs (Ranitidine Hcl) .... 2 At Bedtime 3)  Prilosec Otc 20 Mg Tbec (Omeprazole Magnesium) .... Take 1 Capsule 30-60 Minutes Before First Meal of The Day. 4)  Lasix 40 Mg  Tabs (Furosemide) .... One Daily 5)  Meclizine Hcl 25 Mg  Tabs (Meclizine Hcl) .... 1/2 To 1 Every 6 Hrs As Needed For Dizzinesss 6)  Mucinex Dm 30-600 Mg Xr12h-Tab (Dextromethorphan-Guaifenesin) .... 2 Two Times A Day As Needed 7)  Meloxicam 7.5 Mg Tabs (Meloxicam) .... One Twice Daily With Meals As Needed For Knee Pain 8)  Tylenol 325 Mg Tabs (Acetaminophen) .... Per Bottle Directions As Needed  Allergies (verified): 1)  ! 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) CVA SINUS BRADYCARDIA (ICD-427.81) DIVERTICULOSIS, COLON (ICD-562.10) Internal hemorrhoids COUGH, CHRONIC (ICD-786.2) PVD (ICD-443.9) HYPERLIPIDEMIA (ICD-272.4) HYPERTENSION (ICD-401.9)    - breast tenderness on aldactone August 25, 2010 > d/c October 29, 2010  Hyperkalemia on aldactone October 14, 2008  > d/c October 28, 2010 due to gynecomastia Melanoma,  skin graft 2003..............................................Marland KitchenTafeen    - repeat skin graft 10/09 COPD OSA on CPAP GERD Health Maintenance ..................................................................Marland KitchenWert   -  Td 4/06   -  Pneumovax 1998  Past Surgical History: Last updated: 08/19/2008 Melanoma on left lower leg removed and skin grafting Hernia Surgery  Family History: Last updated: 08/15/2008 Family History of Heart Disease: Father No FH of Colon Cancer  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  Alcohol Use - no Daily Caffeine Use <1 Illicit Drug Use - no  Risk Factors: Smoking Status:  quit (07/22/2010)  Review of Systems      See HPI  Vital Signs:  Patient profile:   77 year old male Height:      67 inches Weight:      190.25 pounds BMI:     29.91 O2 Sat:      98 % on Room air Temp:     97.8 degrees F oral Pulse rate:   50 / minute BP sitting:   120 / 64  (left arm) Cuff size:   regular  Vitals Entered By: Boone Master CNA/MA (December 02, 2010 11:32 AM)  O2 Flow:  Room air CC: 4 week follow up - states he stopped the lasix x2weeks b/c "it was too potent".  would also like a note for therapy for his right hand. Is Patient Diabetic? No Comments Medications reviewed with patient Daytime contact number verified with patient. Boone Master CNA/MA  December 02, 2010 11:32 AM    Physical Exam  Additional Exam:  very frail amb wm walks with cane  no longer getting up on exam table  wt  169 October 14, 2008  > 191 June 02, 2010 > 193 July 22, 2010  > 193 August 25, 2010 > 190 October 29, 2010 >190 12/02/10 HEENT:  Unremarkable.  Oropharynx is clear. LUNG FIELDS:  Coarse breath sounds w/ no wheezing.  HEART:  There is a regular rhythm without murmur, gallop, rub. No edema ABDOMEN:  Soft, benign. EXTREMITIES:  Warm without calf tenderness,   MS  Minimal crepitance L knee, no effusion, tenderness/ erythema Breasts minimal sym enlargement with no nipple discharge   Impression & Recommendations:  Problem # 1:  GYNECOMASTIA (ICD-611.1)  remain off spironolactone.   Orders: Est. Patient Level III ([address])  Problem # 2:  EDEMA (ICD-782.3)  will decrease lasix  Plan:  Decrease Furosemide (Lasix ) 20mg  once daily  Stop spironolactone.  follow up Dr. [name] in 6 weeks  Please contact office for sooner follow up if symptoms do not improve or worsen       Orders: Est. Patient Level III (60454)  Medications Added to Medication List This Visit: 1)  Lasix 20 Mg Tabs (Furosemide) .Marland Kitchen.. 1 by mouth once daily  Complete Medication List: 1)  Coumadin 3 Mg  Tabs (Warfarin sodium) .... Take as directed 2)  Zantac 150 Mg Tabs (Ranitidine hcl) .... 2 at bedtime 3)  Lasix 20 Mg Tabs (Furosemide) .Marland Kitchen.. 1 by mouth once daily 4)  Prilosec Otc 20 Mg Tbec (Omeprazole magnesium) .... Take 1 capsule 30-60 minutes before first meal of the day. 5)  Meclizine Hcl 25 Mg Tabs (Meclizine hcl) .... 1/2 to 1 every 6 hrs as needed for dizzinesss 6)  Mucinex Dm 30-600 Mg Xr12h-tab (Dextromethorphan-guaifenesin) .... 2 two times a day as needed 7)  Meloxicam 7.5 Mg Tabs (Meloxicam) .... One twice daily with meals as needed for knee pain 8)  Tylenol 325 Mg Tabs (Acetaminophen) .... Per bottle directions as needed  Patient Instructions: 1)  Decrease Furosemide (Lasix ) 20mg  once daily  2)  Stop spironolactone.  3)  follow up Dr. [name] in 6 weeks  4)  Please contact office for sooner follow up if symptoms do not improve or worsen  5)    Prescriptions: LASIX 20 MG TABS (FUROSEMIDE) 1 by mouth once daily  #90 x 3   Entered and Authorized by:   Rubye Oaks NP   Signed by:   Rubye Oaks NP on 12/02/2010   Method used:   Electronically to        [facility]* (retail)       [address]       [place], [place]  [id number]       Ph: [phone]       Fax: [phone]   RxID:   [id number]

## 2011-01-07 NOTE — Miscellaneous (Signed)
Summary: OT Order/[facility]  OT Order/[facility]   Imported By: Sherian Rein 12/25/2010 08:38:52  _____________________________________________________________________  External Attachment:    Type:   Image     Comment:   External Document

## 2011-01-07 NOTE — Letter (Signed)
[facility]: [facility]   Imported By: Marylou Mccoy 12/09/2010 18:31:31  _____________________________________________________________________  External Attachment:    Type:   Image     Comment:   External Document

## 2011-01-07 NOTE — Medication Information (Signed)
Summary: Coumadin Clinic   Anticoagulant Therapy  Managed by: Cloyde Reams, RN, BSN Referring MD: [name] PCP: Nolon Lennert MD: Tenny Craw MD, Gunnar Fusi Indication 1: CVA-stroke (ICD-436) Lab Used: [facility] Site: [address] PT 26.3 INR POC 2.6 INR RANGE 2 - 3  Dietary changes: no    Health status changes: no    Bleeding/hemorrhagic complications: no    Recent/future hospitalizations: no    Any changes in medication regimen? no     Any missed doses?: no       Is patient compliant with meds? yes      Comments: Pending possible epidural injection unsure of date.    Allergies: 1)  ! Sulfa  Anticoagulation Management History:      His anticoagulation is being managed by telephone today.  Positive risk factors for bleeding include an age of 75 years or older.  The bleeding index is 'intermediate risk'.  Positive CHADS2 values include History of HTN and Age > 75 years old.  The start date was 07/06/1998.  Prothrombin time is 26.3.  Anticoagulation responsible provider: Tenny Craw MD, Gunnar Fusi.  INR POC: 2.6.    Anticoagulation Management Assessment/Plan:      The patient's current anticoagulation dose is Coumadin 3 mg tabs: take as directed.  The target INR is 2 - 3.  The next INR is due 12/14/2010.  Anticoagulation instructions were given to patient.  Results were reviewed/authorized by Cloyde Reams, RN, BSN.  He was notified by Cloyde Reams, RN.         Prior Anticoagulation Instructions: INR 2.1  Spoke with pt.  Change dose to 1 tablet every day except 2 tablets on Friday.  Recheck INR in 2 weeks.  Orders faxed to [facility].   Current Anticoagulation Instructions: INR 2.6  Called spoke with pt advised to continue on same dosage 1 tablet daily except 2 tablets on Fridays.  Recheck in 3 weeks.  Pt to call once epidural injection scheduled for Lovenox bridge instructions. Orders faxed to [facility].

## 2011-01-07 NOTE — Medication Information (Signed)
Summary: [facility]  Anticoagulant Therapy  Managed by: Cloyde Reams, RN, BSN Referring MD: [name] PCP: Nolon Lennert MD: Daleen Squibb MD, Maisie Fus Indication 1: CVA-stroke (ICD-436) Lab Used: [facility] Site: [address] INR POC 1.9 INR RANGE 2 - 3  Dietary changes: no     Bleeding/hemorrhagic complications: no     Any changes in medication regimen? no     Any missed doses?: no       Is patient compliant with meds? yes       Allergies: 1)  ! Sulfa  Anticoagulation Management History:      His anticoagulation is being managed by telephone today.  Positive risk factors for bleeding include an age of 48 years or older.  The bleeding index is 'intermediate risk'.  Positive CHADS2 values include History of HTN and Age > 3 years old.  The start date was 07/06/1998.  Anticoagulation responsible provider: Daleen Squibb MD, Maisie Fus.  INR POC: 1.9.    Anticoagulation Management Assessment/Plan:      The patient's current anticoagulation dose is Coumadin 3 mg tabs: take as directed.  The target INR is 2 - 3.  The next INR is due 01/04/2011.  Anticoagulation instructions were given to patient.  Results were reviewed/authorized by Cloyde Reams, RN, BSN.  He was notified by Cloyde Reams RN.         Prior Anticoagulation Instructions: INR 2.6  Called spoke with pt advised to continue on same dosage 1 tablet daily except 2 tablets on Fridays.  Recheck in 3 weeks.  Pt to call once epidural injection scheduled for Lovenox bridge instructions. Orders faxed to [facility].   Current Anticoagulation Instructions: INR 1.9  Pt called with INR result, advised pt to take 3mg  extra today, then start taking 3mg  daily except 6mg  on Mondays and Fridays.  Recheck in 2 weeks.

## 2011-01-13 ENCOUNTER — Other Ambulatory Visit: Payer: Self-pay | Admitting: Adult Health

## 2011-01-13 ENCOUNTER — Ambulatory Visit (INDEPENDENT_AMBULATORY_CARE_PROVIDER_SITE_OTHER): Payer: Medicare Other | Admitting: Adult Health

## 2011-01-13 ENCOUNTER — Other Ambulatory Visit: Payer: Medicare Other

## 2011-01-13 ENCOUNTER — Encounter: Payer: Self-pay | Admitting: Adult Health

## 2011-01-13 DIAGNOSIS — I10 Essential (primary) hypertension: Secondary | ICD-10-CM

## 2011-01-13 DIAGNOSIS — L02419 Cutaneous abscess of limb, unspecified: Secondary | ICD-10-CM | POA: Insufficient documentation

## 2011-01-13 DIAGNOSIS — L03119 Cellulitis of unspecified part of limb: Secondary | ICD-10-CM | POA: Insufficient documentation

## 2011-01-13 DIAGNOSIS — H10029 Other mucopurulent conjunctivitis, unspecified eye: Secondary | ICD-10-CM

## 2011-01-13 LAB — BASIC METABOLIC PANEL
BUN: 25 mg/dL — ABNORMAL HIGH (ref 6–23)
CO2: 30 mEq/L (ref 19–32)
Calcium: 8.7 mg/dL (ref 8.4–10.5)
Chloride: 105 mEq/L (ref 96–112)
Creatinine, Ser: 1.3 mg/dL (ref 0.4–1.5)
Glucose, Bld: 73 mg/dL (ref 70–99)

## 2011-01-13 LAB — CBC WITH DIFFERENTIAL/PLATELET
Basophils Relative: 0.2 % (ref 0.0–3.0)
Eosinophils Relative: 1.2 % (ref 0.0–5.0)
HCT: 36.5 % — ABNORMAL LOW (ref 39.0–52.0)
Hemoglobin: 12.4 g/dL — ABNORMAL LOW (ref 13.0–17.0)
Lymphocytes Relative: 19.3 % (ref 12.0–46.0)
Lymphs Abs: 1.8 10*3/uL (ref 0.7–4.0)
Monocytes Relative: 7.4 % (ref 3.0–12.0)
Neutro Abs: 6.7 10*3/uL (ref 1.4–7.7)
RBC: 3.88 Mil/uL — ABNORMAL LOW (ref 4.22–5.81)
RDW: 13.4 % (ref 11.5–14.6)

## 2011-01-13 NOTE — Miscellaneous (Signed)
Summary: [facility]  [facility]   Imported By: Lester [place] 01/06/2011 07:57:18  _____________________________________________________________________  External Attachment:    Type:   Image     Comment:   External Document

## 2011-01-13 NOTE — Medication Information (Signed)
Summary: [facility] Clinic  Anticoagulant Therapy  Managed by: Bethena Midget, RN, BSN Referring MD: [name] PCP: Nolon Lennert MD: Antoine Poche MD, Fayrene Fearing Indication 1: CVA-stroke (ICD-436) Lab Used: [facility] [place] Site: [address] PT 23.9 INR POC 2.4 INR RANGE 2 - 3  Dietary changes: no    Health status changes: no    Bleeding/hemorrhagic complications: no    Recent/future hospitalizations: no    Any changes in medication regimen? no    Recent/future dental: no  Any missed doses?: no       Is patient compliant with meds? yes       Allergies: 1)  ! Sulfa  Anticoagulation Management History:      His anticoagulation is being managed by telephone today.  Positive risk factors for bleeding include an age of 75 years or older.  The bleeding index is 'intermediate risk'.  Positive CHADS2 values include History of HTN and Age > 32 years old.  The start date was 07/06/1998.  Prothrombin time is 23.9.  Anticoagulation responsible provider: Antoine Poche MD, Fayrene Fearing.  INR POC: 2.4.    Anticoagulation Management Assessment/Plan:      The patient's current anticoagulation dose is Coumadin 3 mg tabs: take as directed.  The target INR is 2 - 3.  The next INR is due 01/25/2011.  Anticoagulation instructions were given to patient.  Results were reviewed/authorized by Bethena Midget, RN, BSN.  He was notified by Bethena Midget, RN, BSN.         Prior Anticoagulation Instructions: INR 1.9  Pt called with INR result, advised pt to take 3mg  extra today, then start taking 3mg  daily except 6mg  on Mondays and Fridays.  Recheck in 2 weeks.   Current Anticoagulation Instructions: INR 2.4 Continue 3mg s daily except 6mg s on M&F. Recheck in 3 weeks. Orders sent to [facility].

## 2011-01-14 DIAGNOSIS — H10029 Other mucopurulent conjunctivitis, unspecified eye: Secondary | ICD-10-CM | POA: Insufficient documentation

## 2011-01-18 ENCOUNTER — Encounter: Payer: Self-pay | Admitting: Adult Health

## 2011-01-18 ENCOUNTER — Encounter (INDEPENDENT_AMBULATORY_CARE_PROVIDER_SITE_OTHER): Payer: Medicare Other

## 2011-01-18 ENCOUNTER — Encounter: Payer: Self-pay | Admitting: Pulmonary Disease

## 2011-01-18 ENCOUNTER — Ambulatory Visit (INDEPENDENT_AMBULATORY_CARE_PROVIDER_SITE_OTHER): Payer: Medicare Other | Admitting: Adult Health

## 2011-01-18 DIAGNOSIS — L03119 Cellulitis of unspecified part of limb: Secondary | ICD-10-CM

## 2011-01-18 DIAGNOSIS — L539 Erythematous condition, unspecified: Secondary | ICD-10-CM

## 2011-01-18 DIAGNOSIS — H10029 Other mucopurulent conjunctivitis, unspecified eye: Secondary | ICD-10-CM

## 2011-01-18 DIAGNOSIS — M7989 Other specified soft tissue disorders: Secondary | ICD-10-CM

## 2011-01-19 ENCOUNTER — Telehealth (INDEPENDENT_AMBULATORY_CARE_PROVIDER_SITE_OTHER): Payer: Self-pay | Admitting: *Deleted

## 2011-01-19 DIAGNOSIS — I69359 Hemiplegia and hemiparesis following cerebral infarction affecting unspecified side: Secondary | ICD-10-CM | POA: Insufficient documentation

## 2011-01-19 DIAGNOSIS — I639 Cerebral infarction, unspecified: Secondary | ICD-10-CM

## 2011-01-19 DIAGNOSIS — Z7901 Long term (current) use of anticoagulants: Secondary | ICD-10-CM | POA: Insufficient documentation

## 2011-01-20 ENCOUNTER — Other Ambulatory Visit: Payer: Self-pay | Admitting: Internal Medicine

## 2011-01-20 ENCOUNTER — Ambulatory Visit (INDEPENDENT_AMBULATORY_CARE_PROVIDER_SITE_OTHER)
Admission: RE | Admit: 2011-01-20 | Discharge: 2011-01-20 | Disposition: A | Discharge status: Home / Self Care | Payer: Medicare Other | Source: Ambulatory Visit | Attending: Internal Medicine | Admitting: Internal Medicine

## 2011-01-20 DIAGNOSIS — L03119 Cellulitis of unspecified part of limb: Secondary | ICD-10-CM

## 2011-01-20 DIAGNOSIS — L02419 Cutaneous abscess of limb, unspecified: Secondary | ICD-10-CM

## 2011-01-21 ENCOUNTER — Telehealth: Payer: Self-pay | Admitting: Adult Health

## 2011-01-21 NOTE — Assessment & Plan Note (Signed)
Summary: 6 week rov//sh   Primary Provider/Referring Provider:  [name]  CC:  6 week follow up. Pt c/o increased swelling, heat, redness in left leg, and swelling and redness in left eyex 3-4 days. Pt states breathing ha sbeen doing well. Marland Kitchen  History of Present Illness: 83 yowm status post devastating stroke involving the right brain stem in 1999.  He has been maintained on Coumadin since then and mainly complains of intermittent dizziness dating all the way back to his stroke which is controlled on p.r.n. meclizine.  seen 04/19/08 for fu ok except ? recurrent melanoma left leg  August 26, 2008  cleared for melanoma surgery by Cards   October 14, 2008 ov p skin graft with weaping clear fluid worse as day goes on. no fever or pain or erythema of wound or purulent drainage cp or orhtopnea/ pnd/cp/sob.   May 13, 2010 ov  cc cough off and on for 5 weeks  with white sputum.  He states that he has noticed wheezing for the past few days.  some nasal congestion and sneezing.   swallowing worse lately esp mucinex dm. rec change zantac bedtime, protonix before bfast and diet.  June 02, 2010 cc cough and chest congestion has resolved.  Still has occ cough that his non prod.  He c/o not being able to move his left thumb intermittent cramps up.  Pt is currently on cephalexin 500 mg  for bacterial infection per Tafeen.    August 14 th fell and tore skin R Elbow  July 22, 2010 ov NH nurse concerned that developing cellulitis at abrasion site/ right forearm.  no fever or increase pain, minimal swelling.  --  July 29, 2010--Presents for follow up for right arm cellulitis.  Was seen 1 week ago, tx for for abrasion right forearm, was given keflex - finished this morning.  states no better and has increased warmth x2days. The skin is very thin, nurses are apply a clear dressing but removing it pulls at skin. no fever, confusion, or increased drainage.    August 12, 2010-  cc slow to heal right arm  cellulitis, wound cx showed ENTEROBACTER CLOACAE. Tx w/ Levaquin. Returns today wound is much improved. w/ healed sore and redness/swelling is almost gone. Yesterday hit left elbow w/ small abrasion. no significant redness. complains that his left knee has been hurting . no swelling or redness. Has not used any pain meds. Finish Levaquin.    October 28, 2010 ov cc leg pain no change, not using meloxicam as per instructions , also now c/o gynecomastia and tenderness gradually worse x 2 months= bilaterally while on spironoctone chronically which has controlled his edema and tendency to hbp well. no nipple discharge or pain.     December 02, 2010--Presents for a 4 week follow up.  Last ov spironolactone was stopped due to gynecomastia. He was started on Lasix however he stopped this 2 weeks ago becuase it makes him urinate too much. We discussed several options on diuretics and explained that the spironolactone can cause gynecomastia.  Would also like a note for therapy for his right hand.  Previous therapy in past has helped.>stoppped spironolactone.  January 14, 2011 --  Pt c/o increased swelling, heat, redness in left leg, swelling and redness in left eyex 3-4 days. Pt states breathing has been doing well. Leg redness is getting worse.No increased swelling but tender and red along posterior left leg. He is planning on eye surgery for drooping  lower eye lid. However over last week withi increased redness and drainage . THis is his blind eye. .Denies chest pain, dyspnea, orthopnea, hemoptysis, fever, n/v/d, headache  Current Medications (verified): 1)  Coumadin 3 Mg Tabs (Warfarin Sodium) .... Take As Directed 2)  Zantac 150 Mg  Tabs (Ranitidine Hcl) .... 2 At Bedtime 3)  Lasix 20 Mg Tabs (Furosemide) .Marland Kitchen.. 1 By Mouth Once Daily 4)  Prilosec Otc 20 Mg Tbec (Omeprazole Magnesium) .... Take 1 Capsule 30-60 Minutes Before First Meal of The Day. 5)  Meclizine Hcl 25 Mg  Tabs (Meclizine Hcl) .... 1/2 To 1  Every 6 Hrs As Needed For Dizzinesss 6)  Mucinex Dm 30-600 Mg Xr12h-Tab (Dextromethorphan-Guaifenesin) .... 2 Two Times A Day As Needed 7)  Meloxicam 7.5 Mg Tabs (Meloxicam) .... One Twice Daily With Meals As Needed For Knee Pain 8)  Tylenol 325 Mg Tabs (Acetaminophen) .... Per Bottle Directions As Needed  Allergies (verified): 1)  ! 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) CVA SINUS BRADYCARDIA (ICD-427.81) DIVERTICULOSIS, COLON (ICD-562.10) Internal hemorrhoids COUGH, CHRONIC (ICD-786.2) PVD (ICD-443.9) HYPERLIPIDEMIA (ICD-272.4) HYPERTENSION (ICD-401.9)    - breast tenderness on aldactone August 25, 2010 > d/c October 29, 2010  Hyperkalemia on aldactone October 14, 2008  > d/c October 28, 2010 due to gynecomastia Melanoma,  skin graft 2003..............................................Marland KitchenTafeen    - repeat skin graft 10/09 COPD OSA on CPAP GERD Health Maintenance ..................................................................Marland KitchenWert   -  Td 4/06   -  Pneumovax 1998  Past Surgical History: Last updated: 08/19/2008 Melanoma on left lower leg removed and skin grafting Hernia Surgery  Family History: Last updated: 08/15/2008 Family History of Heart Disease: Father No FH of Colon Cancer  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  Alcohol Use - no Daily Caffeine Use <1 Illicit Drug Use - no  Risk Factors: Smoking Status: quit (07/22/2010)  Review of Systems      See HPI  Vital Signs:  Patient profile:   77 year old male Height:      67 inches Weight:      193.25 pounds BMI:     30.38 O2 Sat:      97 % on Room air Temp:     98.1 degrees F oral Pulse rate:   55 / minute BP sitting:   116 / 76  (left arm) Cuff size:   regular  Vitals Entered By: Carver Fila (January 13, 2011 9:41 AM)  O2 Flow:  Room air CC: 6 week follow up. Pt c/o  increased swelling, heat, redness in left leg, swelling and redness in left eyex 3-4 days. Pt states breathing ha sbeen doing well.  Comments meds and allergies updated Phone number updated Carver Fila  January 13, 2011 9:46 AM    Physical Exam  Additional Exam:  very frail amb wm walks with cane    193 July 22, 2010  > 193 August 25, 2010 > 190 October 29, 2010 >190 12/02/10>193 January 13, 2011  GEN: A/Ox3; pleasant , NAD HEENT:  [place]/AT, , EACs-clear, TMs-wnl, NOSE-clear, THROAT-clear left eye lower lid irritated, injjected conjunctiva.  NECK:  Supple w/ fair ROM; no JVD; normal carotid impulses w/o bruits; no thyromegaly or nodules palpated; no lymphadenopathy. RESP  Clear to P & A; w/o, wheezes/ rales/ or rhonchi. CARD:  RRR, no m/r/g   GI:   Soft & nt; nml bowel sounds; no organomegaly  or masses detected. Musco: Warm bil,  no calf tenderness edema, clubbing, pulses intact,  along left lower leg, previous scar from melanoma surgery-redness along posteriior calf with scaly skin. venous insufficiency changes      Impression & Recommendations:  Problem # 1:  CELLULITIS AND ABSCESS OF LEG EXCEPT FOOT (ICD-682.6)  venous insufficiency with cellulitis  plan;  Keflex 500mg  four times a day for 7days Wash area with soap and water , pat dry  follow up 2 weeks  I will call with labs.  Please contact office for sooner follow up if symptoms do not improve or worsen   His updated medication list for this problem includes:    Keflex 500 Mg Caps (Cephalexin) .Marland Kitchen... 1 by mouth four times a day  Orders: T-Culture, Wound ([id number]) Est. Patient Level IV (47829)  Problem # 2:  CONJUNCTIVITIS, BACTERIAL (ICD-372.03)  Apply erythromycin eye ointment to eye four times a day for 1 week.   Orders: Est. Patient Level IV (56213)  Problem # 3:  HYPERTENSION (ICD-401.9) labs pending.  His updated medication list for this problem includes:    Lasix 20 Mg Tabs (Furosemide)  .Marland Kitchen... 1 by mouth once daily  Orders: TLB-CBC Platelet - w/Differential (85025-CBCD) TLB-BMP (Basic Metabolic Panel-BMET) (80048-METABOL) TLB-TSH (Thyroid Stimulating Hormone) (84443-TSH)  BP today: 116/76 Prior BP: 120/64 (12/02/2010)  Labs Reviewed: K+: 4.9 (06/02/2010) Creat: : 1.4 (06/02/2010)   Chol: 240 (04/26/2007)   HDL: 37.8 (04/26/2007)   LDL: DEL (04/26/2007)   TG: 228 (04/26/2007)  Medications Added to Medication List This Visit: 1)  Erythromycin 5 Mg/gm Oint (Erythromycin) .... Apply 1/2 inch ribbon of ointment in affected eye four times a day for 7 days. 2)  Keflex 500 Mg Caps (Cephalexin) .Marland Kitchen.. 1 by mouth four times a day  Patient Instructions: 1)  Keflex 500mg  four times a day for 7days 2)  Wash area with soap and water , pat dry 3)  Apply erythromycin eye ointment to eye four times a day for 1 week.  4)  follow up 2 weeks  5)  I will call with labs.  6)  Please contact office for sooner follow up if symptoms do not improve or worsen  Prescriptions: KEFLEX 500 MG CAPS (CEPHALEXIN) 1 by mouth four times a day  #28 x 0   Entered and Authorized by:   Rubye Oaks NP   Signed by:   Rubye Oaks NP on 01/13/2011   Method used:   Electronically to        [facility]* (retail)       [address]       [place], [place]  [id number]       Ph: [phone]       Fax: [phone]   RxID:   [id number] ERYTHROMYCIN 5 MG/GM OINT (ERYTHROMYCIN) apply 1/2 inch ribbon of ointment in affected eye four times a day for 7 days.  #1 x 0   Entered and Authorized by:   Rubye Oaks NP   Signed by:   Rubye Oaks NP on 01/13/2011   Method used:   Electronically to        [facility]* (retail)       [address]       [place], [place]  [address]       Ph: [phone]       Fax: [phone]   RxID:   [id number]    Immunization History:  Influenza Immunization History:    Influenza:  historical (10/06/2010)

## 2011-01-25 ENCOUNTER — Encounter: Payer: Self-pay | Admitting: Cardiovascular Disease

## 2011-01-25 LAB — CONVERTED CEMR LAB: POC INR: 2.7

## 2011-01-27 ENCOUNTER — Encounter: Payer: Self-pay | Admitting: Internal Medicine

## 2011-01-27 ENCOUNTER — Ambulatory Visit: Payer: Medicare Other | Admitting: Adult Health

## 2011-01-27 NOTE — Miscellaneous (Signed)
Summary: Orders Update  Clinical Lists Changes  Orders: Added new Test order of Venous Duplex Lower Extremity (Venous Duplex Lower) - Signed 

## 2011-01-27 NOTE — Assessment & Plan Note (Signed)
Summary: Acute NP office visit - left leg cellulitis   Primary Provider/Referring Provider:  [name]  CC:  left leg worse: increased LE edema, red, and warm to touch x3days - denies fever or pain.  History of Present Illness: 34 yowm status post devastating stroke involving the right brain stem in 1999.  He has been maintained on Coumadin since then and mainly complains of intermittent dizziness dating all the way back to his stroke which is controlled on p.r.n. meclizine.  seen 04/19/08 for fu ok except ? recurrent melanoma left leg  August 26, 2008  cleared for melanoma surgery by Cards   October 14, 2008 ov p skin graft with weaping clear fluid worse as day goes on. no fever or pain or erythema of wound or purulent drainage cp or orhtopnea/ pnd/cp/sob.   May 13, 2010 ov  cc cough off and on for 5 weeks  with white sputum.  He states that he has noticed wheezing for the past few days.  some nasal congestion and sneezing.   swallowing worse lately esp mucinex dm. rec change zantac bedtime, protonix before bfast and diet.  June 02, 2010 cc cough and chest congestion has resolved.  Still has occ cough that his non prod.  He c/o not being able to move his left thumb intermittent cramps up.  Pt is currently on cephalexin 500 mg  for bacterial infection per Tafeen.    August 14 th fell and tore skin R Elbow  July 22, 2010 ov NH nurse concerned that developing cellulitis at abrasion site/ right forearm.  no fever or increase pain, minimal swelling.  --  July 29, 2010--Presents for follow up for right arm cellulitis.  Was seen 1 week ago, tx for for abrasion right forearm, was given keflex - finished this morning.  states no better and has increased warmth x2days. The skin is very thin, nurses are apply a clear dressing but removing it pulls at skin. no fever, confusion, or increased drainage.    August 12, 2010-  cc slow to heal right arm cellulitis, wound cx showed ENTEROBACTER CLOACAE.  Tx w/ Levaquin. Returns today wound is much improved. w/ healed sore and redness/swelling is almost gone. Yesterday hit left elbow w/ small abrasion. no significant redness. complains that his left knee has been hurting . no swelling or redness. Has not used any pain meds. Finish Levaquin.    October 28, 2010 ov cc leg pain no change, not using meloxicam as per instructions , also now c/o gynecomastia and tenderness gradually worse x 2 months= bilaterally while on spironoctone chronically which has controlled his edema and tendency to hbp well. no nipple discharge or pain.     December 02, 2010--Presents for a 4 week follow up.  Last ov spironolactone was stopped due to gynecomastia. He was started on Lasix however he stopped this 2 weeks ago becuase it makes him urinate too much. We discussed several options on diuretics and explained that the spironolactone can cause gynecomastia.  Would also like a note for therapy for his right hand.  Previous therapy in past has helped.>stoppped spironolactone.  January 14, 2011 --  Pt c/o increased swelling, heat, redness in left leg, swelling and redness in left eyex 3-4 days. Pt states breathing has been doing well. Leg redness is getting worse.No increased swelling but tender and red along posterior left leg. He is planning on eye surgery for drooping lower eye lid. However over last week withi  increased redness and drainage . THis is his blind eye. .Denies chest pain, dyspnea, orthopnea, hemoptysis, fever, n/v/d, headache January 18, 2011 --Returns today for persistent symptoms of lower extremity edema. Seen last week with lower extremity edema w/ suspected cellulits .Tx w/ Keflex. Wound cx returned with no growth. Says he is no better and leg is more swollen and red.  He feels  okay with no fever or change in appetite. NO n/v/. Denies chest pain,  orthopnea, hemoptysis, fever, n/v/d, edema, headache.Labs last visit w/CBC and BNP were unrevealing.  Medications  Prior to Update: 1)  Coumadin 3 Mg Tabs (Warfarin Sodium) .... Take As Directed 2)  Zantac 150 Mg  Tabs (Ranitidine Hcl) .... 2 At Bedtime 3)  Lasix 20 Mg Tabs (Furosemide) .Marland Kitchen.. 1 By Mouth Once Daily 4)  Prilosec Otc 20 Mg Tbec (Omeprazole Magnesium) .... Take 1 Capsule 30-60 Minutes Before First Meal of The Day. 5)  Meclizine Hcl 25 Mg  Tabs (Meclizine Hcl) .... 1/2 To 1 Every 6 Hrs As Needed For Dizzinesss 6)  Mucinex Dm 30-600 Mg Xr12h-Tab (Dextromethorphan-Guaifenesin) .... 2 Two Times A Day As Needed 7)  Meloxicam 7.5 Mg Tabs (Meloxicam) .... One Twice Daily With Meals As Needed For Knee Pain 8)  Tylenol 325 Mg Tabs (Acetaminophen) .... Per Bottle Directions As Needed 9)  Erythromycin 5 Mg/gm Oint (Erythromycin) .... Apply 1/2 Inch Ribbon of Ointment in Affected Eye Four Times A Day For 7 Days. 10)  Keflex 500 Mg Caps (Cephalexin) .Marland Kitchen.. 1 By Mouth Four Times A Day  Current Medications (verified): 1)  Coumadin 3 Mg Tabs (Warfarin Sodium) .... Take As Directed 2)  Zantac 150 Mg  Tabs (Ranitidine Hcl) .... 2 At Bedtime 3)  Lasix 20 Mg Tabs (Furosemide) .Marland Kitchen.. 1 By Mouth Once Daily 4)  Prilosec Otc 20 Mg Tbec (Omeprazole Magnesium) .... Take 1 Capsule 30-60 Minutes Before First Meal of The Day. 5)  Meclizine Hcl 25 Mg  Tabs (Meclizine Hcl) .... 1/2 To 1 Every 6 Hrs As Needed For Dizzinesss 6)  Mucinex Dm 30-600 Mg Xr12h-Tab (Dextromethorphan-Guaifenesin) .... 2 Two Times A Day As Needed 7)  Meloxicam 7.5 Mg Tabs (Meloxicam) .... One Twice Daily With Meals As Needed For Knee Pain 8)  Tylenol 325 Mg Tabs (Acetaminophen) .... Per Bottle Directions As Needed 9)  Erythromycin 5 Mg/gm Oint (Erythromycin) .... Apply 1/2 Inch Ribbon of Ointment in Affected Eye Four Times A Day For 7 Days. 10)  Keflex 500 Mg Caps (Cephalexin) .Marland Kitchen.. 1 By Mouth Four Times A Day  Allergies (verified): 1)  ! Sulfa  Past History:  Past Medical History: Last updated: 10/28/2010 BLINDNESS, ONE EYE  (ICD-369.60) CVA SINUS BRADYCARDIA (ICD-427.81) DIVERTICULOSIS, COLON (ICD-562.10) Internal hemorrhoids COUGH, CHRONIC (ICD-786.2) PVD (ICD-443.9) HYPERLIPIDEMIA (ICD-272.4) HYPERTENSION (ICD-401.9)    - breast tenderness on aldactone August 25, 2010 > d/c October 29, 2010  Hyperkalemia on aldactone October 14, 2008  > d/c October 28, 2010 due to gynecomastia Melanoma,  skin graft 2003..............................................Marland KitchenTafeen    - repeat skin graft 10/09 COPD OSA on CPAP GERD Health Maintenance ..................................................................Marland KitchenWert   -  Td 4/06   -  Pneumovax 1998  Past Surgical History: Last updated: 08/19/2008 Melanoma on left lower leg removed and skin grafting Hernia Surgery  Family History: Last updated: 08/15/2008 Family History of Heart Disease: Father No FH of Colon Cancer  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  Alcohol Use - no Daily Caffeine Use <1 Illicit  Drug Use - no  Risk Factors: Smoking Status: quit (07/22/2010)  Review of Systems      See HPI  Vital Signs:  Patient profile:   77 year old male Height:      67 inches Weight:      193.19 pounds O2 Sat:      97 % on Room air Temp:     98.6 degrees F oral Pulse rate:   63 / minute BP sitting:   138 / 80  (left arm) Cuff size:   regular  Vitals Entered By: Boone Master CNA/MA (January 18, 2011 11:34 AM)  O2 Flow:  Room air CC: left leg worse: increased LE edema, red, warm to touch x3days - denies fever or pain Is Patient Diabetic? No Comments Medications reviewed with patient Daytime contact number verified with patient. Boone Master CNA/MA  January 18, 2011 11:36 AM    Physical Exam  Additional Exam:  very frail amb wm walks with cane    193 July 22, 2010  > 193 August 25, 2010 > 190 October 29, 2010 >190 12/02/10>193 January 13, 2011  GEN: A/Ox3; pleasant , NAD HEENT:   [place]/AT, , EACs-clear, TMs-wnl, NOSE-clear, THROAT-clear, Left eye lower lid cleared up  NECK:  Supple w/ fair ROM; no JVD; normal carotid impulses w/o brui.ts; no thyromegaly or nodules palpated; no lymphadenopathy. RESP  Clear to P & A; w/o, wheezes/ rales/ or rhonchi. CARD:  RRR, no m/r/g   GI:   Soft & nt; nml bowel sounds; no organomegaly or masses detected. Musco: Warm bil,  no calf tenderness edema, clubbing, pulses intact,  along left lower leg, previous scar from melanoma surgery-redness along posteriior calf with scaly skin. venous insufficiency changes, stasis dermatic changes and edema       Impression & Recommendations:  Problem # 1:  CELLULITIS AND ABSCESS OF LEG EXCEPT FOOT (ICD-682.6)  Wound cx neg supsect this is stasis dermatitc flare from edema set up for venous doppler  finsih keflex.  apply topicort to area two times a day x 1 week  check xray today.  if not improving will need to refer to Dermatology-Tafeen.   His updated medication list for this problem includes:    Keflex 500 Mg Caps (Cephalexin) .Marland Kitchen... 1 by mouth four times a day  Orders: T-Tib/Fib Left (73590TC) Doppler Referral (Doppler) Est. Patient Level IV (62130)  Problem # 2:  CONJUNCTIVITIS, BACTERIAL (ICD-372.03) resolving   Orders: Est. Patient Level IV (86578)  Medications Added to Medication List This Visit: 1)  Betamethasone Dipropionate Aug 0.05 % Crea (Betamethasone dipropionate aug) .... Apply to area two times a day for 7 days  Patient Instructions: 1)  Finsh Keflex  2)  Wash area with soap and water , pat dry 3)  I will call with xray results.  4)  We are setting you up for a venous doppler of your left leg.  5)  Apply beclamethosone to leg two times a day for 1week.  6)  follow up 2 weeks Dr. [name] and as needed  7)  elevated leg as much as possible.  8)  Please contact office for sooner follow up if symptoms do not improve or worsen  Prescriptions: BETAMETHASONE DIPROPIONATE  AUG 0.05 % CREA (BETAMETHASONE DIPROPIONATE AUG) apply to area two times a day for 7 days  #1 x 0   Entered and Authorized by:   Rubye Oaks NP   Signed by:   Rubye Oaks NP on 01/18/2011  Method used:   Electronically to        [facility]* (retail)       [address]       [place], [place]  [id number]       Ph: [phone]       Fax: [phone]   RxID:   [id number]

## 2011-01-27 NOTE — Progress Notes (Signed)
Summary: returning call  Phone Note Call from Patient Call back at Home Phone [phone]   Caller: Patient Call For: Keenya Matera Summary of Call: Returning call. Initial call taken by: Darletta Moll,  January 21, 2011 10:42 AM  Follow-up for Phone Call        called and spoke with pt and he is aware of TP recs---to increase the lasix 20 mg   to 2 daily x 2 days then back to the 20mg  daily---he will call to set up appt with Dr. Hortense Ramal if his leg gets no better---will call for any problems Randell Loop CMA  January 21, 2011 11:35 AM

## 2011-01-27 NOTE — Progress Notes (Signed)
Summary: returning call  Phone Note Call from Patient   Caller: Son--rod--[phone] Call For: parrett Reason for Call: Talk to Nurse Summary of Call: Returning Tammy's call from yesterday. Initial call taken by: Lehman Prom,  January 19, 2011 9:38 AM  Follow-up for Phone Call        PT left office yesterday without having xray of leg done.  Spoke withe pt's son and he will bring pt back to office on 01-20-11 to have xray of leg.  Shanda Bumps to put order in omputer.Abigail Miyamoto RN  January 19, 2011 10:54 AM      Appended Document: Orders Update - t-fib/fib left, pt to come 12.15.12  per 12.14.12 phone note, pt to return to office for t-fib/fib left on 12.15.12.  future order placed in EMR. Boone Master CNA/MA  January 19, 2011 11:04 AM   Clinical Lists Changes  Orders: Added new Test order of T-Tib/Fib Left ([id number]) - Signed

## 2011-01-29 ENCOUNTER — Telehealth: Payer: Self-pay | Admitting: Internal Medicine

## 2011-02-02 ENCOUNTER — Encounter: Payer: Self-pay | Admitting: Internal Medicine

## 2011-02-02 ENCOUNTER — Ambulatory Visit (INDEPENDENT_AMBULATORY_CARE_PROVIDER_SITE_OTHER): Payer: Medicare Other | Admitting: Internal Medicine

## 2011-02-02 DIAGNOSIS — I10 Essential (primary) hypertension: Secondary | ICD-10-CM

## 2011-02-02 DIAGNOSIS — R609 Edema, unspecified: Secondary | ICD-10-CM

## 2011-02-02 NOTE — Progress Notes (Signed)
Summary: Off Coumadin for eye surgery   ---- Converted from flag ---- ---- 01/25/2011 6:13 PM, Nyoka Cowden MD wrote: ok with me to stop  ---- 01/25/2011 3:15 PM, Weston Brass PharmD wrote: Pt having eye surgery next week.  Needs clearance to stop Coumadin.  Thanks. ------------------------------  Phone Note Outgoing Call   Call placed by: Weston Brass PharmD,  January 29, 2011 8:49 AM Summary of Call: [facility] for pt with okay to stop Coumadin  Initial call taken by: Weston Brass PharmD,  January 29, 2011 8:50 AM

## 2011-02-02 NOTE — Medication Information (Signed)
Summary: Coumadin Clinic   Anticoagulant Therapy  Managed by: Weston Brass, PharmD Referring MD: [name] PCP: Nolon Lennert MD: Clifton James MD, Cristal Deer Indication 1: CVA-stroke (ICD-436) Lab Used: [facility] [place] Site: [address] INR POC 2.7 INR RANGE 2 - 3  Dietary changes: no    Health status changes: no    Bleeding/hemorrhagic complications: no    Recent/future hospitalizations: yes       Details: having eye surgery on 3/1.  Will send note to Dr. [name] for clearance  Any changes in medication regimen? no    Recent/future dental: no  Any missed doses?: no       Is patient compliant with meds? yes       Allergies: 1)  ! Sulfa  Anticoagulation Management History:      Positive risk factors for bleeding include an age of 75 years or older.  The bleeding index is 'intermediate risk'.  Positive CHADS2 values include History of HTN and Age > 45 years old.  The start date was 07/06/1998.  Anticoagulation responsible provider: Clifton James MD, Cristal Deer.  INR POC: 2.7.    Anticoagulation Management Assessment/Plan:      The patient's current anticoagulation dose is Coumadin 3 mg tabs: take as directed.  The target INR is 2 - 3.  The next INR is due 02/09/2011.  Anticoagulation instructions were given to patient.  Results were reviewed/authorized by Weston Brass, PharmD.  He was notified by Weston Brass PharmD.         Prior Anticoagulation Instructions: INR 2.4 Continue 3mg s daily except 6mg s on M&F. Recheck in 3 weeks. Orders sent to [facility].   Current Anticoagulation Instructions: INR 2.7  Spoke with pt.  Continue same dose of 1 tablet every day except 2 tablets on Mon and Friday.  Recheck INR 5 days after procedure.  orders faxed to [facility].

## 2011-02-02 NOTE — Letter (Signed)
Summary: PT/INR  PT/INR   Imported By: Marylou Mccoy 01/25/2011 13:44:53  _____________________________________________________________________  External Attachment:    Type:   Image     Comment:   External Document

## 2011-02-09 ENCOUNTER — Encounter: Payer: Self-pay | Admitting: Cardiology

## 2011-02-11 NOTE — Assessment & Plan Note (Signed)
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  He has been maintained on Coumadin since then and mainly complains of intermittent dizziness dating all the way back to his stroke which is controlled on p.r.n. meclizine.  seen 04/19/08 for fu ok except ? recurrent melanoma left leg  August 26, 2008  cleared for melanoma surgery by Cards   October 14, 2008 ov p skin graft with weaping clear fluid worse as day goes on. no fever or pain or erythema of wound or purulent drainage cp or orhtopnea/ pnd/cp/sob.   May 13, 2010 ov  cc cough off and on for 5 weeks  with white sputum.  He states that he has noticed wheezing for the past few days.  some nasal congestion and sneezing.   swallowing worse lately esp mucinex dm. rec change zantac bedtime, protonix before bfast and diet.  June 02, 2010 cc cough and chest congestion has resolved.  Still has occ cough that his non prod.  He c/o not being able to move his left thumb intermittent cramps up.  Pt is currently on cephalexin 500 mg  for bacterial infection per Tafeen.    August 14 th fell and tore skin R Elbow  July 22, 2010 ov NH nurse concerned that developing cellulitis at abrasion site/ right forearm.  no fever or increase pain, minimal swelling.  --  July 29, 2010--Presents for follow up for right arm cellulitis.  Was seen 1 week ago, tx for for abrasion right forearm, was given keflex - finished this morning.  states no better and has increased warmth x2days. The skin is very thin, nurses are apply a clear dressing but removing it pulls at skin. no fever, confusion, or increased drainage.    August 12, 2010-  cc slow to heal right arm cellulitis, wound cx showed ENTEROBACTER CLOACAE. Tx w/ Levaquin. Returns today wound is much improved. w/ healed sore and redness/swelling  is almost gone. Yesterday hit left elbow w/ small abrasion. no significant redness. complains that his left knee has been hurting . no swelling or redness. Has not used any pain meds. Finish Levaquin.    October 28, 2010 ov cc leg pain no change, not using meloxicam as per instructions , also now c/o gynecomastia and tenderness gradually worse x 2 months= bilaterally while on spironoctone chronically which has controlled his edema and tendency to hbp well. no nipple discharge or pain.     December 02, 2010--Presents for a 4 week follow up.  Last ov spironolactone was stopped due to gynecomastia. He was started on Lasix however he stopped this 2 weeks ago becuase it makes him urinate too much. We discussed several options on diuretics and explained that the spironolactone can cause gynecomastia.  Would also like a note for therapy for his right hand.  Previous therapy in past has helped.>stoppped spironolactone.  January 14, 2011 --  Pt c/o increased swelling, heat, redness in left leg, swelling and redness in left eyex 3-4 days. Pt states breathing has been doing well. Leg redness is getting worse.No increased swelling but tender and red along posterior left leg. He is planning on eye surgery for drooping lower eye lid. However over last week withi increased redness and drainage . THis is his blind eye. . January 18, 2011 --Returns today for persistent  symptoms of lower extremity edema. Seen last week with lower extremity edema w/ suspected cellulits .Tx w/ Keflex. Wound cx returned with no growth. Says he is no better and leg is more swollen and red.  He feels  okay with no fever or change in appetite. NO n/v/. Denies chest pain,  orthopnea, hemoptysis, fever, n/v/d, edema, headache.Labs last visit w/CBC and BNP were unrevealing. Finsh Keflex  Wash area with soap and water , pat dry   We are setting you up for a venous doppler of your left leg > neg dvt Apply beclamethosone to leg two times a day for  1week.    February 02, 2011 ov swelling better, no sob. Pt denies any significant sore throat, dysphagia, itching, sneezing,  nasal congestion or excess secretions,  fever, chills, sweats, unintended wt loss, pleuritic or exertional cp, hempoptysis, change in activity tolerance  orthopnea pnd or leg swelling Pt also denies any obvious fluctuation in symptoms with weather or environmental change or other alleviating or aggravating factors.       Current Medications (verified): 1)  Coumadin 3 Mg Tabs (Warfarin Sodium) .... Take As Directed-Hold 2)  Zantac 150 Mg  Tabs (Ranitidine Hcl) .... 2 At Bedtime 3)  Lasix 20 Mg Tabs (Furosemide) .Marland Kitchen.. 1 By Mouth Once Daily 4)  Prilosec Otc 20 Mg Tbec (Omeprazole Magnesium) .... Take 1 Capsule 30-60 Minutes Before First Meal of The Day. 5)  Meclizine Hcl 25 Mg  Tabs (Meclizine Hcl) .... 1/2 To 1 Every 6 Hrs As Needed For Dizzinesss 6)  Mucinex Dm 30-600 Mg Xr12h-Tab (Dextromethorphan-Guaifenesin) .... 2 Two Times A Day As Needed 7)  Meloxicam 7.5 Mg Tabs (Meloxicam) .... One Twice Daily With Meals As Needed For Knee Pain 8)  Tylenol 325 Mg Tabs (Acetaminophen) .... Per Bottle Directions As Needed  Allergies (verified): 1)  ! 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..............................Marland KitchenWert   -  Td 03/2005   -   Pneumovax 1998  Vital Signs:  Patient profile:   77 year old male Weight:      193 pounds O2 Sat:      96 % on Room air Temp:     98.1 degrees F oral Pulse rate:   59 / minute BP sitting:   150 / 82  (left arm)  Vitals Entered By: Vernie Murders (February 02, 2011 10:54 AM)  O2 Flow:  Room air  Physical Exam  Additional Exam:  very frail amb wm walks with cane    193 July 22, 2010  > 193 August 25, 2010 > 190 October 29, 2010 >190 12/02/10>193 January 13, 2011  GEN: A/Ox3; pleasant , NAD HEENT:  [place]/AT, , EACs-clear, TMs-wnl, NOSE-clear, THROAT-clear, Left eye lower lid cleared up  NECK:  Supple w/ fair ROM; no JVD; normal carotid impulses w/o brui.ts; no thyromegaly or nodules palpated; no lymphadenopathy. RESP  Clear to P & A; w/o, wheezes/ rales/ or rhonchi. CARD:  RRR, no m/r/g   GI:  Soft & nt; nml bowel sounds; no organomegaly or masses detected. [place]: Warm bil,  no calf tenderness edema, clubbing, pulses intact,  along left lower leg, previous scar from melanoma surgery- no longer has any redness along posteriior calf with scaly skin. venous insufficiency changes, stasis dermatic changes trace  edema only      Impression & Recommendations:  Problem # 1:  EDEMA (ICD-782.3)  ok control on present rx  Orders: Est. Patient Level III (78295)  Problem # 2:  HYPERTENSION (ICD-401.9)  His updated medication list for this problem includes:    Lasix 20 Mg Tabs (Furosemide) .Marland Kitchen... 1 by mouth once daily    Each maintenance medication was reviewed in detail including most importantly the difference between maintenance and as needed and under what circumstances the prns are to be used. This was done in the context of a medication calendar review which provided the patient with a user-friendly unambiguous mechanism for medication administration and reconciliation and provides an action plan for all active problems. It is critical that this be shown to every doctor  for  modification during the office visit if necessary so the patient can use it as a working document.   Orders: Est. Patient Level III (62130)  Medications Added to Medication List This Visit: 1)  Coumadin 3 Mg Tabs (Warfarin sodium) .... Take as directed-hold  Other Orders: Misc. Referral (Misc. Ref)  Patient Instructions: 1)  Elevate leg as much as possible 2)  Wear elastic stockings as much as you can 3)  Return to office in 3 months, sooner if needed

## 2011-02-11 NOTE — Miscellaneous (Signed)
Summary: OT orders/[facility]  OT orders/[facility]   Imported By: Sherian Rein 02/03/2011 07:24:39  _____________________________________________________________________  External Attachment:    Type:   Image     Comment:   External Document

## 2011-02-11 NOTE — Letter (Signed)
[facility]   Imported By: Marylou Mccoy 02/02/2011 14:23:21  _____________________________________________________________________  External Attachment:    Type:   Image     Comment:   External Document

## 2011-02-12 ENCOUNTER — Encounter: Payer: Self-pay | Admitting: Cardiology

## 2011-02-16 NOTE — Medication Information (Signed)
Summary: Coumadin Clinic   Anticoagulant Therapy  Managed by: Bethena Midget, RN, BSN Referring MD: [name] PCP: Nolon Lennert MD: Riley Kill MD, Maisie Fus Indication 1: CVA-stroke (ICD-436) Lab Used: [facility] [place] Site: [address] INR POC 2.0 INR RANGE 2 - 3  Dietary changes: no    Health status changes: no    Bleeding/hemorrhagic complications: no    Recent/future hospitalizations: no    Any changes in medication regimen? no    Recent/future dental: no  Any missed doses?: no       Is patient compliant with meds? yes       Allergies: 1)  ! Sulfa  Anticoagulation Management History:      His anticoagulation is being managed by telephone today.  Positive risk factors for bleeding include an age of 75 years or older.  The bleeding index is 'intermediate risk'.  Positive CHADS2 values include History of HTN and Age > 33 years old.  The start date was 07/06/1998.  Anticoagulation responsible provider: Riley Kill MD, Maisie Fus.  INR POC: 2.0.    Anticoagulation Management Assessment/Plan:      The patient's current anticoagulation dose is Coumadin 3 mg tabs: take as directed-HOLD.  The target INR is 2 - 3.  The next INR is due 03/01/2011.  Anticoagulation instructions were given to patient.  Results were reviewed/authorized by Bethena Midget, RN, BSN.  He was notified by Bethena Midget, RN, BSN.         Prior Anticoagulation Instructions: INR 2.7  Spoke with pt.  Continue same dose of 1 tablet every day except 2 tablets on Mon and Friday.  Recheck INR 5 days after procedure.  orders faxed to [facility].   Current Anticoagulation Instructions: INR 2.0 Continue 3mg s daily except 6mg s on Mondays and Fridays. Recheck in 3 weeks.

## 2011-02-17 ENCOUNTER — Telehealth (INDEPENDENT_AMBULATORY_CARE_PROVIDER_SITE_OTHER): Payer: Self-pay | Admitting: *Deleted

## 2011-02-23 NOTE — Medication Information (Signed)
Summary: Lab Orders  Lab Orders   Imported By: Marylou Mccoy 02/16/2011 16:20:04  _____________________________________________________________________  External Attachment:    Type:   Image     Comment:   External Document

## 2011-02-23 NOTE — Progress Notes (Signed)
Summary: what type of elastic stockings-needs rx sent out to home  Phone Note Call from Patient Call back at [phone]   Caller: Son//rod Call For: wert Summary of Call: States that at last ov pt was told to wear elastic stocking as much as possible, wants to know where to purchase stocking pls advise. Initial call taken by: Darletta Moll,  February 17, 2011 12:45 PM  Follow-up for Phone Call        Dr. [name], per last ov 02/02/11 pt was informed to wear elastic stockings. Is this same as compression hose? thanks [facility]  February 17, 2011 2:18 PM  yes compression hose (or could save some money and just wear women's thigh highs Follow-up by: Nyoka Cowden MD,  February 17, 2011 5:58 PM  Additional Follow-up for Phone Call Additional follow up Details #1::        lmomtcb x1 Mindy Silva  February 18, 2011 9:14 AM   Spoke with pt son Rod and he wants an order sent to homecare company for compression hose so insurance will cover it. Please advsie if ok to place order. [name] CMA  February 18, 2011 11:14 AM ok with me  dx edema  Additional Follow-up by: Nyoka Cowden MD,  February 18, 2011 5:15 PM    Additional Follow-up for Phone Call Additional follow up Details #2::    [id number] to inform pt's son, Rod that order sent to homecare company for compression stockings. Marland Kitchen Arman Filter LPN  February 18, 2011 5:19 PM   Called several DME Companies and they do not have compression stockings.  DME companies do not supply this. Pt must be given a prescription and take it to a medical supply company.  Rhonda Cobb  February 19, 2011 9:13 AM  called and informed ROD that no DME companies supplies compression hose. Pt has to go get it from a medical supply company and will need rx for this. I have printed off rx and put into MW look out for him to sign. Pt sone wants this maield to pt home after it is signed Carver Fila  February 19, 2011 9:43 AM    Additional Follow-up for Phone Call Additional follow  up Details #3:: Details for Additional Follow-up Action Taken: RX was signed and i put in mail to mail out to pt's home. Pt son is aware Carver Fila  February 19, 2011 11:33 AM   New/Updated Medications: * COMPRESSION HOSE Use as directed Prescriptions: COMPRESSION HOSE Use as directed  #1 pair x PRN   Entered by:   Carver Fila   Authorized by:   Nyoka Cowden MD   Signed by:   Carver Fila on 02/19/2011   Method used:   Print then Give to Patient   RxID:   [id number]

## 2011-03-01 ENCOUNTER — Ambulatory Visit (INDEPENDENT_AMBULATORY_CARE_PROVIDER_SITE_OTHER): Payer: Self-pay | Admitting: Cardiovascular Disease

## 2011-03-01 NOTE — Patient Instructions (Addendum)
INR 2.9 drawn at [facility]. Spoke with patient and gave instructions to continue taking 1 tablet (3 mg) daily, except take 2 tablets on Mondays and Fridays. Recheck in 3 weeks on 03/22/2011.  Faxed instructions to [facility]. Windell Hummingbird, RN

## 2011-03-22 ENCOUNTER — Ambulatory Visit: Payer: Self-pay | Admitting: Cardiology

## 2011-03-22 LAB — POCT INR: INR: 3.2

## 2011-04-02 ENCOUNTER — Other Ambulatory Visit: Payer: Self-pay

## 2011-04-05 ENCOUNTER — Ambulatory Visit: Payer: Self-pay | Admitting: Cardiology

## 2011-04-19 ENCOUNTER — Other Ambulatory Visit: Payer: Self-pay | Admitting: Dermatology

## 2011-04-20 NOTE — Assessment & Plan Note (Signed)
[facility]                             PULMONARY OFFICE NOTE   NAME:[name], [name]                       MRN:          [id number]  DATE:09/18/2007                            DOB:          [birth year]    HISTORY OF PRESENT ILLNESS:  The patient is an 77 year old white male  patient of Dr. [name] who has a known history of hypertension, peripheral  vascular disease, status post cerebrovascular accident with right-sided  hemiparesis who presents today for a 2-week followup.  At last visit,  the patient was having significant difficulties with neck and arm pain.  He is being followed by Dr. Ethelene Hal.  The patient reports he recently had  a pain injection and has had significant improvement with symptoms,  decreased neck and arm pain, back to his baseline.  The patient says he  feels the best he has felt in a long time.  The patient was also started  on Diovan/hydrochlorothiazide 160/12.5 mg daily last time for elevated  blood pressure.  The patient reports he has been tolerating well and not  having any difficulties.  The patient denies any chest pain,  palpitations, orthopnea, PND, or increased leg swelling.   PAST MEDICAL HISTORY:  Reviewed.   CURRENT MEDICATIONS:  Reviewed.   PHYSICAL EXAMINATION:  GENERAL:  The patient is a pleasant male in no  acute distress.  VITAL SIGNS:  He is afebrile with stable vital signs.  O2 saturation 97%  on room air.  HEENT:  Unremarkable.  LUNGS:  Lung sounds are clear.  CARDIAC:  Regular rate.  ABDOMEN:  Soft and nontender.  EXTREMITIES:  Warm without any cyanosis or clubbing.  There is trace  edema, left greater than right.   IMPRESSION AND PLAN:  1. Hypertension, much improved with Diovan/hydrochlorothiazide.  The      patient has had difficulties in the past with bradycardia; however,      he seems to be tolerating this  without any notable difficulties.      The patient will continue on his present regimen and  follow back up      here in 2-3 months with Dr. [name] or sooner if needed.  2. Cervical arthritis.  The patient is much improved after his pain      injections.  The patient will continue to follow up with Dr. Ethelene Hal      as recommended.      Rubye Oaks, NP  Electronically Signed      Charlaine Dalton. [name], MD, [facility]  Electronically Signed   TP/MedQ  DD: 09/18/2007  DT: 09/18/2007  Job #: [id number]

## 2011-04-20 NOTE — Assessment & Plan Note (Signed)
[facility]                             PULMONARY OFFICE NOTE   NAME:[name], [name]                       MRN:          [id number]  DATE:05/22/2007                            DOB:          [birth year]    HISTORY OF PRESENT ILLNESS:  The patient is an 77 year old white male  patient of Dr. Thurston Hole who has a known history of right brain stem stroke  in 1999, with right sided paraparesis who returns today for follow-up.  At his last visit patient was complaining of neck pain and was felt to  have some cervical arthritis and was encouraged to [place] his Mobic.  The  patient returns today with complaint that he has had no improvement in  his symptoms and continues to have intermittent neck pain along with  left shoulder, arm and hip and leg pain.  The patient complains his  symptoms seem to wax and wane and has not improved taking Mobic on a  more regular basis.  Patient denies any left-sided weakness, known  injury, chest pain, palpitations or shortness of breath.  The patient  has previously been seen in the past, by Dr. Ranell Patrick at [facility] for degenerative lumbar spine, degenerative joint disease  and degenerative disk disease.  Patient had received some epidural  steroid injection previously with improvement.   LAB WORK LAST VISIT:  Revealed a sed rate at 21.   PHYSICAL EXAMINATION:  GENERAL:  The patient is a chronically ill-  appearing male in no acute distress.  VITAL SIGNS:  Temperature 99, blood pressure 132/68, O2 saturation is  96% on room air. Heart rate is 58.  HEENT:  Unremarkable.  NECK:  Without adenopathy. No JVD. Cervical range of motion is decreased  without reproducible symptoms.  LEFT SHOULDER:  Range of motion is nontender and without reproducible  symptoms.  LUNGS:  Clear.  CARDIAC:  Sinus rhythm.  ABDOMEN:  Soft, nontender, no palpable hepatosplenomegaly.  EXTREMITIES:  Are warm without any calf tenderness, cyanosis,  clubbing,  there is trace edema. Normal hand grip on the left.  Left knee without palpable deformity or swelling.  Patient does have  chronic right-sided paraparesis.   IMPRESSION AND PLAN:  Diffuse arthralgias without response to  nonsteroidals. Patient is on chronic Coumadin therapy. Patient will be  referred back to orthopedics, Dr. Ranell Patrick, for evaluation and possible  treatment options.      Rubye Oaks, NP  Electronically Signed      Charlaine Dalton. [name], MD, [facility]  Electronically Signed   TP/MedQ  DD: 05/22/2007  DT: 05/22/2007  Job #: [id number]   cc:   Almedia Balls. Ranell Patrick, M.D.

## 2011-04-20 NOTE — Assessment & Plan Note (Signed)
[facility]                            CARDIOLOGY OFFICE NOTE   NAME:[name], [name]                       MRN:          [id number]  DATE:07/24/2008                            DOB:          [birth year]    The patient was seen in the [facility]   PRIMARY CARDIOLOGIST:  Doylene Canning. Ladona Ridgel, MD   PRIMARY CARE PHYSICIAN:  Charlaine Dalton. [name], MD, FCCP   This is an very pleasant 77 year old white male patient who needs to  undergo surgery on his right lower leg for recurrent melanoma as well as  skin grafting.  This is to be performed by Dr. Luretha Murphy and he is  here for cardiac clearance.  He saw Lewayne Bunting once in January 2008  for some bradycardia and evaluation for possible need of pacemaker.  At  that time, he was felt to have very mild chronotrophic incompetence, but  based on his advanced age and lack of specific symptoms of bradycardia,  pacemaker insertion was contraindicated and he was cleared to proceed  with melanoma surgery at that time.   The patient is on chronic Coumadin for a prior CVA and has COPD and is  on CPAP.  He has hypertension and hyperlipidemia.  The patient lives at  friends home.  He is quite active and goes to the gym on a regular  basis.  He denies any chest pain, palpitations, dizziness, or  presyncope.   CURRENT MEDICATIONS:  1. Coumadin as directed.  2. Ranitidine 150 mg b.i.d.  3. Spironolactone 25 mg b.i.d.   PHYSICAL EXAMINATION:  GENERAL:  This is an very pleasant 77 year old  white male in no acute distress.  VITAL SIGNS:  Blood pressure 124/74, pulse 69, and weight 183.  NECK:  Without JVD, HJR, bruit or thyroid enlargement.  LUNGS:  Decreased breath sounds, but clear in anterior, posterior, and  lateral.  HEART:  Regular rate and rhythm at 70 beats per minute.  Normal S1 and  S2.  Positive S4.  No murmur, rub, bruit, thrill or heave noted.  ABDOMEN:  Soft without organomegaly, masses, lesions or  abnormal  tenderness.  EXTREMITIES:  He has scarring from his prior melanoma surgery in his  right ankle and +1-2 edema on the right leg.  Left lower extremity  without cyanosis, clubbing, or edema.  He has good distal pulses.   EKG normal sinus rhythm at 69 beats per minute.  Normal EKG.   IMPRESSION:  1. Recurrent melanoma of the right lower extremity for repeat      resection and possible skin grafting.  2. Prior history of bradycardia, felt to be a very mild chronotrophic      incompetence.  No need for pacemaker.  3. Frequent premature ventricular contractions, asymptomatic.  4. History of cerebrovascular accident.  5. Chronic Coumadin therapy for above.  6. Chronic obstructive pulmonary disease, on continuous positive      airway pressure.  7. Hypertension.  8. Hyperlipidemia.  9. Gastroesophageal reflux disease.  10.Diverticulosis.   PLAN:  After discussing with the patient and with Dr. Juanda Chance, we  feel he  does not need any further cardiac workup and may proceed with his  surgery on his right lower leg for melanoma.  The patient will follow up  with [place] p.r.n.      Jacolyn Reedy, PA-C  Electronically Signed      Everardo Beals. Juanda Chance, MD, [facility]  Electronically Signed   ML/MedQ  DD: 07/24/2008  DT: 07/25/2008  Job #: [id number]   cc:   Thornton Park. [name], MD

## 2011-04-20 NOTE — Assessment & Plan Note (Signed)
[facility]                             PULMONARY OFFICE NOTE   NAME:[name], [name]                       MRN:          [id number]  DATE:04/26/2007                            DOB:          05/09/1919    HISTORY:  This is an exceptionally complicated, 77 year old, white male  status post debilitating right brain stem stroke in 1999 with right arm  greater than right leg weakness whose been barely able to maintain on  his own and has had a steady decline over the last year marked by  generalized weakness and now comes in hurting in his neck when he turns  his head. This has been going on for a month and is reproducible with  rotation without radicular features. Although he has the recommendation  to use Mobic p.r.n. arthritis pain, he did not recognize it as such. The  patient also has a history of hyperlipidemia with multiple aches and  pains that seem better after he stopped cholesterol medicines. He denies  any exertional chest pain, TIA or claudication symptoms, orthopnea, PND  or leg swelling.   PAST MEDICAL HISTORY:  1. Significant for peripheral vascular disease status post right brain      stem stroke in 1999.  2. Hypertension.  3. Hyperlipidemia.  4. Target LDL less than 70 based on hypertension and documented      peripheral vascular disease.  5. Status post melanoma resection of the left leg reported to be stage      4 in 2003.  6. Sleep apnea CPAP dependent.  7. Gastroesophageal reflux disease complicated by esophagitis and      stricture. Normal by most recent upper endoscopy April 1994.  8. Diverticulosis by colonoscopy in June 2004.  9. 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  10.Sinus bradycardia, chronic.  11.Chronic cough with dyspnea better on PPI therapy with ranitidine      daily, not presently limited by dyspnea and not requiring any form      of bronchodilator.   MEDICATIONS:  Taken in detail on the  worksheet from the medication  calendar he carries with him. Correct as listed in the column dated Apr 26, 2007.   ALLERGIES:  None known but apparently cannot tolerate statins.   SOCIAL HISTORY:  He is a retired Animal nutritionist. He walks with a cane.  His wife has severe Alzheimer's disease and is in the skilled nursing  facility where he lives as well.   FAMILY HISTORY:  Positive for MI in his father who lived to be 30. No  family history of any prostate or colon cancer to his knowledge.   REVIEW OF SYSTEMS:  Taken in detail and significant for frequent  nocturia but note the patient is drinking fluids right up until bedtime  because he thought it was good for him. He denies any decreased force  in stream or dysuria.   PHYSICAL EXAMINATION:  GENERAL:  This is a chronically ill, severely  debilitated white male in no acute distress.  VITAL SIGNS:  He had stable vital signs.  HEENT:  Remarkably unremarkable. Oropharynx clear. There is no excessive  postnasal drainage or cobblestoning.  NECK:  Supple without cervical adenopathy or tenderness. The trachea is  midline, no thyromegaly. He had limited external rotation bilaterally  and limited abduction as well reproduced his pain with no radicular  features.  LUNGS:  Lung fields clear bilaterally to auscultation and percussion.  HEART:  Regular rate and rhythm without murmur, gallop or rub.  ABDOMEN:  Soft and benign.  GENITOURINARY:  Testes descended bilaterally, no nodules.  RECTAL:  Moderate BPH but smooth texture, no nodule. Stool guaiac  negative.  EXTREMITIES:  Warm without calf tenderness, cyanosis, clubbing or edema.  Pedal pulses were symmetric bilaterally.  NEUROLOGIC:  He had severe right-sided hemiparesis but still able to get  up on the exam table with one person one arm assist.   LABORATORY DATA:  CBC was normal, sed rate was only 21. Chemistry  profile was unremarkable. LFTs were normal. Total cholesterol was 240   with an LDL of 139, HDL of 38. TSH is normal. CRP is only 1. PFTs were  performed today because the patient's complaints of chronic cough and  dyspnea reveal a normal FEV1/FEC ratio and normal diffusion capacity.   IMPRESSION:  1. New onset cervical arthritis for which the patient already has a      prescription to take Mobic p.r.n. but did not know he could use it      for neck pain. I emphasized to him that any pain that hurts      reproducibly with joint motion is arthritis until proven otherwise      and he should go ahead and use the Mobic and then contact me if it      is not effective. Will need to have him be seen by an orthopedist      if he does not respond.  2. Hyperlipidemia and peripheral vascular disease. Technically his LDL      is less than 80 but he cannot take statins. Since his CRP is low      and the main damage to his vascular system has already debilitated      him to the point of limited function, I do not see the benefit in      this particular case of trying to push medications with greater      cost and likely side effects in this setting and we discussed this      in detail today.  3. Chronic cough is better with treatment directed at reflux.  4. Sleep apnea is well compensated on present dose of CPAP regimen.  5. Hypertension is well controlled on his present regimen which does      not include any negative inotropes noting that he has relative      sinus bradycardia at baseline.  6. Health maintenance. He was to receive a tetanus in 2006 and      Pneumovax in 1998.   FOLLOWUP:  I will see the patient back every 3 months, sooner if needed  with an orthopedic referral to be considered in the meantime if needed.    Charlaine Dalton. [name], MD, [facility]  Electronically Signed   MBW/MedQ  DD: 04/26/2007  DT: 04/26/2007  Job #: [id number]

## 2011-04-20 NOTE — Op Note (Signed)
NAME:  [name], [name]                ACCOUNT NO.:  [id number]   MEDICAL RECORD NO.:  [id number]          PATIENT TYPE:  INP   LOCATION:  1534                         FACILITY:  [facility]   PHYSICIAN:  Thornton Park. 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  Some of these were probably satellite lesions.  He underwent PET scan on  June 26, 2008 which showed soft tissue lesions in the subcutaneous fat  in the medial left tibial region and the medial thigh just above the  knee and there was also a positive left inguinal lymph node.  I  discussed these findings with his son and we felt like we would at least  try to get local control although the one on the tibial region which Dr.  Jorja Loa had biopsied is recurrent.  It was also somewhat raised.   SURGEON:  Luretha Murphy, MD.   ANESTHESIA:  General.   PROCEDURE:  Wide excision of the melanoma of the left tibial region  creating a defect approximately 3 x 7 cm, excising it down to the muscle  and off the muscle, full-thickness application of a skin graft to this  region, excision of a probable metastatic melanoma of the left thigh  above the knee with primary closure, harvesting of full-thickness skin  graft from the left lower abdomen with primary closure.   DESCRIPTION OF PROCEDURE:  Mr. Perrott was taken to room 1 on Thursday  morning, September 05, 2008, given general anesthesia.  The entire lower  extremity and abdomen were prepped with Techni-Care and draped  sterilely.  I worked on the area on his pretibial region first and  excised an ellipse about 7 x 3 cm and carried this down deep and just  basically we took it off the muscle, removing some muscle, getting  fascia.  There were grossly negative  margins.  The area was irrigated  and packed off with a saline sponge.  With separate instruments, I again  used a longitudinal excision with an ellipse to excise the lesion of his  distal thigh.  Again, this was an approximately 2 x 6 cm excision  including skin down to the fascia.   This was closed primarily with 4-0 Vicryls and with interrupted nylon  sutures.   Next, I excised a 3 x 7 cm ellipse of skin from the lower abdomen and  closed that primarily with a running 3-0 nylon.  The skin graft was  defatted and fenestrated with an 11 blade.  It was then sewn into the  defect in the left lower extremity after I approximated either end of  the ellipse, and I sutured it directly down into the muscle, into the  edges of this the skin margin, and then held it down with some Tisseel  tissue sealant as well as the multiple 4-0 Vicryls that I tacked  around  the perimeter.  A Xeroflo dressing with Neosporin was then applied with  fluffs.  It was secured as a bolus dressing with 3-0 nylon sutures  placed well back from the perimeter to hold the bolus dressing in  place, which was then held in place with an Ace wrap.  The patient  seemed to tolerate the procedure well.  He will be kept for overnight  observation here at [facility] and will need to have this  bolus dressing taken down in about 5 days.      Thornton Park [name], MD  Electronically Signed     MBM/MEDQ  D:  09/05/2008  T:  09/05/2008  Job:  [id number]   cc:   Ria Bush. Jorja Loa, M.D.  Fax: [phone]   R. Roetta Sessions, M.D.  [address]  [place]  [place] [address]   Doylene Canning. Ladona Ridgel, MD  [address]  [address]  [place]  [place] [address]

## 2011-04-20 NOTE — Assessment & Plan Note (Signed)
[facility]                             PULMONARY OFFICE NOTE   NAME:[name], [name] SIEVERS                       MRN:          [id number]  DATE:08/21/2007                            DOB:          July 01, 1919    PRIMARY SERVICE/EXTENDED FOLLOWUP OFFICE VISIT:   HISTORY:  An 77 year old white male having severe neck pain radiating  down his left arm for which he was turned down for injection today after  holding Coumadin for 5 days because his blood pressure was too high.  Previously he had trouble with tolerating antihypertensives because of  bradycardia, even on Norvasc which reportedly is a peripherally-acting  calcium channel blocker.  He denies any orthopnea, PND, leg swelling, or  chest pains.   PRESENT MEDICATIONS:  1. Ranitidine twice a day.  2. Promethazine p.r.n.   Note that he had previously failed nonsteroidals including Mobic and  received significant benefit from neck injections in terms of symptom  relief.   PHYSICAL EXAMINATION:  GENERAL:  He is an aggravated-appearing,  ambulatory, white male in no acute distress.  VITAL SIGNS:  He is afebrile with normal vital signs with blood pressure  180/88.  HEENT:  Unremarkable.  Pharynx is clear.  LUNGS:  Fields clear bilaterally on auscultation percussion.  HEART:  Regular rhythm without murmur, gallop, or rub.  ABDOMEN:  Soft, benign.  EXTREMITIES:  Warm without calf tenderness, cyanosis, clubbing, edema.   IMPRESSION:  1. Hypertension, partly related to pain control.  Normally I would use      clonidine here but if Norvasc made him profoundly bradycardic, then      clonidine will too.  I have recommended instead a trial of Diovan      160/12.5 one daily and gave him 4 weeks' supply with return here in      4 weeks for followup.  2. I spent almost 30 minutes today with this patient regarding the      benefits versus risks of taking Coumadin long term.  Clearly, he is      aggravated because  he has stopped the Coumadin in order to have the      injection but now cannot have the injection because of his blood      pressure.  It is unlikely that his blood pressure will improve      without control of his pain.  Therefore, I strongly supported going      ahead and doing the injection even though his blood pressure is a      bit high and once the injections have been completed then certainly      he can go back on the Coumadin.  I emphasized to the patient that      in the short run it is important to treat the active problem      present and defer the risk management and reduction of that risk      (by resuming Coumadin) until the present problem is solved.  In      fact, such high blood pressure do risk intracerebral hemorrhage and  also note that he is becoming more frail because the left arm is      the main arm he walks with because of his previous stroke,      therefore, if he is not able to use his left arm he is going to be      in high risk of balance issues      and falling which will indicate that he could not be on Coumadin      anyway which would be a strong contraindication using Coumadin      anyway.     [name]. [name] Sires, MD, [facility]  Electronically Signed    MBW/MedQ  DD: 08/21/2007  DT: 08/21/2007  Job #: [id number]   cc:   Shelby Dubin, PharmD, BCPS, CPP  Ramos, Dr.

## 2011-04-23 NOTE — Assessment & Plan Note (Signed)
[facility]                             PULMONARY OFFICE NOTE   NAME:[name], [name]                       MRN:          [id number]  DATE:03/06/2007                            DOB:          [birth year]    HISTORY:  An 77 year old white male with a history of brainstem stroke  seen on March 7 with acute hoarseness for which he was concerned he  might be having another stroke but in fact this has happened previously  several months ago and resolved this time after he took Protonix 40-mg  tablets at our nurse practitioner's recommendation and switched  ranitidine to q.h.s. dosing only.  He ran out of his samples after 2  weeks but did not have any recurrence of his symptoms so he resumed  taking ranitidine as he has before, namely b.i.d. dosing, with no  present hoarseness or dysphagia.  He also denies any history of purulent  sputum, fevers, chills, sweats, chest pain, leg swelling, or obvious TIA  symptoms.   PHYSICAL EXAMINATION:  GENERAL:  He is a pleasant, chronically-ill white  man in no acute distress, walking with a cane.  VITAL SIGNS:  He is afebrile with normal vital signs.  His blood  pressure is 129/80 with a pulse rate of 54 off of all medicines.  HEENT:  Unremarkable, oropharynx is clear.  There is no evidence of  excessive postnasal drainage or cobblestoning.  NECK:  Supple without cervical adenopathy or tenderness.  Trachea is  midline, no thyromegaly.  LUNG FIELDS:  Completely clear bilaterally to auscultation and  percussion with excellent air movement.  HEART:  There is regular rate and rhythm without murmur, gallop, rub.  ABDOMEN:  Soft, benign.  EXTREMITIES:  Warm without calf tenderness, cyanosis, clubbing, or  edema.   IMPRESSION:  Acute hoarseness and dysphagia, probably on the basis of  reflux.  I have advised the patient to continue ranitidine b.i.d. but  have a low threshold to switch over to Prilosec 20 mg b.i.d. before  meals if he has recurrent symptoms.  These instructions were reviewed  with him in the context of a reflux flyer, discussing the  pathophysiology of reflux as well as dietary restrictions.  Followup  will be due for comprehensive healthcare evaluation in May of this year.  Will see him sooner if needed.     Charlaine Dalton. [name], MD, [facility]  Electronically Signed    MBW/MedQ  DD: 03/06/2007  DT: 03/06/2007  Job #: [id number]

## 2011-04-23 NOTE — Assessment & Plan Note (Signed)
[facility]                             PULMONARY OFFICE NOTE   NAME:[name], [name]                       MRN:          [id number]  DATE:11/22/2006                            DOB:          Nov 14, 1919    HISTORY OF PRESENT ILLNESS:  This is a very complicated 77 year old  white male patient of Dr. Thurston Hole with a known history of hypertension,  hyperlipidemia, and status post CVA with right sided hemiplegia that  presents for a 1-week followup.  Patient was seen last week by Dr. [name]  with nonspecific complaints of body aches, low-grade fever over the last  several weeks.  Patient complained specifically of aches along his upper  shoulders and arm area.  Patient was suspected to possibly have  polymyalgia rheumatica versus myalgias secondary to adverse drug effect  from Zocor.  Patient was asked to stop Zocor, and lab work was done with  a sed rate, CBC, CMET, and TSH.  His sed rate came back significantly  elevated at 101.  CMET was essentially unremarkable except for a blood  sugar of 110.  Blood count was okay with a hemoglobin of 12.5, and TSH  was normal.  Also, albumin was low at 2.9.  Patient returns today and  reports that his myalgias have improved somewhat; however, he still  continues to feel somewhat weak and have generalized aches along his  upper arms and shoulders.  He denies any ocular symptoms, chest pain,  shortness of breath, or leg swelling.   PAST MEDICAL HISTORY:  Reviewed.   CURRENT MEDICATIONS:  Reviewed.   PHYSICAL EXAMINATION:  Patient is a chronically ill-appearing white male  in no acute distress.  He is afebrile with stable vital signs.  O2  saturation is 92% on room air.  HEENT:  Unremarkable.  NECK:  Supple without adenopathy.  LUNGS:  Revealed diminished breath sounds at bases, otherwise clear.  CARDIAC:  Regular rate and rhythm.  ABDOMEN:  Soft and nontender.  EXTREMITIES:  Warm without any calf tenderness,  cyanosis, clubbing, with  trace edema.  The patient does have right-sided hemiparesis.   IMPRESSION/PLAN:  A nonspecific complaint of mild myalgias which are  improved off of Zocor.  Patient's sed rate was significantly elevated.  Today we will recheck sed rate along with a CK total and liver function  tests.  At this time, he will continue to hold Zocor, and will recheck  here in 2 weeks with Dr. [name], or sooner if needed.  May need to refer  to rheumatology and/or start on high-dose steroids.      Rubye Oaks, NP  Electronically Signed      Charlaine Dalton. [name], MD, [facility]  Electronically Signed   TP/MedQ  DD: 11/25/2006  DT: 11/26/2006  Job #: [id number]

## 2011-04-23 NOTE — Assessment & Plan Note (Signed)
[facility]                             PULMONARY OFFICE NOTE   NAME:[name], [name]                       MRN:          [id number]  DATE:01/09/2007                            DOB:          1919-09-08    PRIMARY/FOLLOWUP OFFICE VISIT   A very frail 77 year old white male with hypertension and a new onset  aching all over in November 2007, and I thought initially might be a  viral syndrome, but was associated with mild elevation of sed rate and  he is totally back in baseline in terms of how he feels on the Zocor  since December. He denies any GI or claudication symptoms, orthopnea,  PND or leg swelling.   Note that he has been diagnosed with recurrent melanoma on his left arm  and left leg, but these are felt to be localized and with no evidence of  metastatic disease.   His only medications at this point consist of Coumadin and ranitidine.   PHYSICAL EXAMINATION:  He is a pleasant, ambulatory white male in no  acute distress. He has stable vital signs.  HEENT: Is unremarkable. Oropharynx is clear.  LUNGS: Lung fields are clear bilaterally to auscultation and percussion.  HEART: He has a regular rhythm with an apical pulse of around 50,  without murmur, gallop or rub.  ABDOMEN: Soft, benign.  EXTREMITIES: Warm without calf tenderness, cyanosis, clubbing or edema.  He showed me the two areas that were felt to be melanoma. These are not  really all that pigmented and the left arm appears superficial. The left  leg appears somewhat nodular.   IMPRESSION:  1. Recurrent melanoma. I have encouraged him to followup with [facility]      for surgery as needed.  2. Complete resolution of all of his aches and pains off of statins. I      am going to recommend a repeat sed rate today hoping to see some      further decline in this level. A persistent elevation of sed rate      might indicate polymyalgia rheumatica or the systemic effects of      melanoma  (note the absence of any obvious metastatic disease at      this point however).   Followup either way will be every three months with melanoma evaluation  and treatment at [facility] in the meantime. Will continue him off of  statins as well and I explained my reasoning behind this recommendation,  especially in the absence of any evidence that Zetia is of benefit in  terms of outcomes data.     Charlaine Dalton. [name], MD, [facility]  Electronically Signed   MBW/MedQ  DD: 01/09/2007  DT: 01/09/2007  Job #: [id number]

## 2011-04-23 NOTE — Op Note (Signed)
NAME:  [name], [name]                ACCOUNT NO.:  [id number]   MEDICAL RECORD NO.:  [id number]          PATIENT TYPE:  AMB   LOCATION:  SDS                          FACILITY:  [facility]   PHYSICIAN:  Thornton Park. [name], MD  DATE OF BIRTH:  [birth year]   DATE OF PROCEDURE:  01/25/2007  DATE OF DISCHARGE:  01/25/2007                               OPERATIVE REPORT   PREOPERATIVE DIAGNOSES:  Melanomas of left forearm and left leg.   PROCEDURE:  Wide excision of melanomas of left forearm and left leg.   SURGEON:  Thornton Park. [name], MD   ANESTHESIA:  MAC with 1% lidocaine local.   SPECIMENS:  Had sutures placed on the distal margins and were sent for  permanent sections.   ESTIMATED BLOOD LOSS:  Minimal.   DESCRIPTION OF PROCEDURE:  [name]. [name] is an 77 year old man from  [facility], who was taken to room 10 at [facility] on  January 25, 2007 and given some intravenous sedation.  Dr. Katrinka Blazing did  have to put a central line in through his neck for IV access.  We dealt  with these sequentially and I dealt with his left arm first.  The area  was prepped with Techni-Care and draped sterilely.  I infiltrated the  area with 1% lidocaine and made a long ellipse getting around with  several millimeter margins on both medial and lateral aspects and taking  this down to the fascia with electrocautery.  Suture was placed on the  distal margin.  The wound was approximated with 4-0 Vicryl  subcutaneously and with a running 4-0 Nylon.  Neosporin was applied and  it was dressed with an Ace wrap.  Next, the leg was approached in a  similar way, prepping it with Techni-Care, draping sterilely.  It was  infiltrated with 1% lidocaine and a long ellipse was taken down to the  fascia.  Bleeding was control with electrocautery.  I raised medial and  lateral flaps with a bovie and then closed this with 4-0 Vicryl  subcutaneously and with a running 4-0 Nylon.  Again it was dressed with  Neosporin,  Adaptic and an Ace wrap.  The patient seemed to tolerate both  procedures well and was taken to the recovery room in satisfactory  condition.  He was given Vicodin to take for pain and will be allowed to  go back to the [facility] later today.      Thornton Park [name], MD  Electronically Signed     MBM/MEDQ  D:  01/25/2007  T:  01/26/2007  Job:  [id number]   cc:   Attn:  Dr. Janalyn Harder [facility]

## 2011-04-23 NOTE — Assessment & Plan Note (Signed)
[facility]                             PULMONARY OFFICE NOTE   NAME:[name], [name]                       MRN:          [id number]  DATE:11/04/2006                            DOB:          21-Jun-1919    PRIMARY SERVICE ACUTE OFFICE EVALUATION   HISTORY:  A very frail 77 year old white male with hypertension,  hyperlipidemia, and status post CVA with right-sided hemiplegia, who  presents with a 3-day history of anorexia and low grade fever with aches  all over.  He denies, however, any cough, sore throat, sinus complaints,  chest pain, abdominal pain, or change in bowel or bladder habits, or  specific joint abnormalities or rash or unusual exposure.   Medications were inventoried on the progress note dated November 04, 2006 and reviewed, and include Zocor.   PHYSICAL EXAMINATION:  He appears just a bit less well than baseline.  VITAL SIGNS:  Temperature is 100 degrees with a pulse rate of only 62.  HEENT:  Unremarkable.  Oropharynx is clear.  No evidence of erythema.  NECK:  Supple without cervical adenopathy or tenderness. Trachea is  midline.  No thyromegaly.  LUNGS:  Fields are clear bilaterally to auscultation and percussion.  CARDIAC:  Regular rate and rhythm without murmur, gallop, or rub.  ABDOMEN:  Soft and benign.  EXTREMITIES:  Warm without calf tenderness, cyanosis, clubbing, or  edema.   IMPRESSION:  Acute febrile illness consistent with a viral syndrome.  Because he is so frail and it is Friday afternoon, I am going to  recommend initiating Doxycycline empirically 100 mg b.i.d., but caution  him should he develop any reaction to Doxycycline such as vomiting, or  develop any new symptoms over the weekend that do not respond to  Tylenol, I would like him to give [place] a call.  I have also asked him to  stop the Symbicort until the aching in his muscles resolves, as this  could be an adverse drug effect.     Charlaine Dalton. [name], MD,  [facility]  Electronically Signed    MBW/MedQ  DD: 11/04/2006  DT: 11/06/2006  Job #: [id number]

## 2011-04-23 NOTE — Assessment & Plan Note (Signed)
[facility]                             PULMONARY OFFICE NOTE   NAME:[name]                       MRN:          [id number]  DATE:11/16/2006                            DOB:          [birth year]    PRIMARY SERVICE/EXTENDED OFFICE VISIT   HISTORY:  This is an 77 year old white male with complaints of aching  and low-grade fever for the last several weeks.  He was already treated  empirically for a URI 2 weeks ago and stated this made no difference.  He has a temperature of about 100 every evening and aches in all of  his muscles, especially his shoulders and arms.  He denies any  exertional chest pain, sore throat, rigors, cough, dyspnea, orthopnea,  PND or leg swelling, abdominal pain or change in bowel or bladder  habits, or specific joint complaints.   PAST MEDICAL HISTORY:  Significant for cerebrovascular accident  involving the right brainstem with chronic right hemiparesis and chronic  Coumadin therapy on this basis.  He also has history of hypertension and  obstructive sleep apnea on CPAP, as well as melanoma diagnosed in 2003  with an excision on the left leg.   ALLERGIES:  None known.   MEDICATIONS:  Include Coumadin, simvastatin, and ranitidine.  He stopped  simvastatin for several days to see if it made any difference and then  restarted it.  He is not convinced that on or off of it it has any  impact on his aches and pains.   SOCIAL HISTORY:  He has never smoked.   FAMILY HISTORY:  Negative for rheumatologic disease.   REVIEW OF SYSTEMS:  Taken in detail and essentially negative except for  poor appetite and generalized weakness.  Negative for ocular complaints  or classic jaw claudication.   PHYSICAL EXAMINATION:  GENERAL:  This is a frail but alert ambulatory  white male in no acute distress.  He is afebrile with normal vital  signs.  HEENT:  Unremarkable, pharynx clear.  NECK:  Supple without cervical adenopathy or  tenderness.  Trachea is  midline, no thyromegaly.  LUNG FIELDS:  Perfectly clear bilaterally to auscultation and  percussion.  HEART:  Regular rate and rhythm without murmur, gallop, rub.  ABDOMEN:  Soft, benign.  EXTREMITIES:  Warm without calf tenderness, cyanosis, clubbing, or  edema.  He continues to have a right-sided hemiparesis, right leg equal  to arm in strength.   LABORATORY:  He has a sed rate of 101; otherwise, his labs are  unremarkable with creatinine of 1.5 and normal LFTs.   IMPRESSION:  Possible polymyalgia rheumatica versus adverse drug effect  from Zocor.  I am going to ask him to stop the Zocor for several weeks.  Will treat him with Tylenol only for now, with the option of empirically  starting him on steroids versus referring him to rheumatology if his  symptoms persist.     Casimiro Needle B. [name], MD, [facility]  Electronically Signed    MBW/MedQ  DD: 11/17/2006  DT: 11/17/2006  Job #: [id number]

## 2011-04-23 NOTE — Assessment & Plan Note (Signed)
[facility]                         ELECTROPHYSIOLOGY OFFICE NOTE   NAME:[name], [name]                       MRN:          [id number]  DATE:12/27/2006                            DOB:          09-09-1919    REFERRING PHYSICIAN:  Charlaine Dalton. [name], MD, [facility]   Mr. Carvalho is referred today by Dr. Francella Solian for consideration for  permanent pacemaker insertion.   HISTORY OF PRESENT ILLNESS:  The patient is a very pleasant, 77 year old  man who has a history of melanoma and is status post resection with  recurrent melanomas who is considering skin excision in the next few  weeks. The patient has a history of chronic lung disease and bronchitis  and chronic fatigue and weakness. He underwent 24-hour Holter monitoring  by Dr. Francella Solian which demonstrated a minimum heart rate down in the 30s  with infrequent PVCs but no VT. The patient's maximum heart rate was 100  beats per minute, his average heart rate was 56 beats per minute. As  noted a moment ago, his minimum heart rate was in the 30s. He is  referred today for consideration for permanent pacemaker implantation.  The patient denies syncope. He denies dizzy spells but does note chronic  fatigue and weakness.   His additional past medical history is notable for stroke in the past  with right hemiparesis. He has been on chronic Coumadin. He has a  history of sleep apnea on CPAP. He has a history of melanoma as  previously noted. He has dyslipidemia.   FAMILY HISTORY:  Negative with his advanced age.   SOCIAL HISTORY:  The patient denies tobacco or ethanol abuse. He is a  retired Animal nutritionist. He very remotely smokes cigarettes.   PHYSICAL EXAMINATION:  GENERAL: He is a pleasant but chronically ill-  appearing, 77 year old man who is in no acute distress.  VITAL SIGNS:  The blood pressure today was 148/91, the pulse was 60 and  regular, the respirations were 18, the weight was 191 pounds.  HEENT:   Normocephalic, atraumatic. The oropharynx was moist. The sclera  were anicteric.  NECK:  Revealed no jugular venous distention. There was no thyromegaly.  The trachea was midline. The carotids were 2+ and symmetric.  LUNGS:  Clear bilaterally to auscultation except for a very rare rale  and wheeze. There was no increased work of breathing.  CARDIOVASCULAR:  Distant with a regular rate and rhythm and normal S1  and S2.  EXTREMITIES:  Demonstrated cyanosis, clubbing or edema. Pulses are 1+  and symmetric bilaterally.  NEUROLOGIC:  Alert and oriented x3 with cranial nerves intact. He does  have a right-sided hemiparesis.   His EKG demonstrates sinus rhythm with frequent PVCs in a bigeminal  fashion. This EKG was demonstrated on December 24. Prior ECGs  demonstrated sinus bradycardia with heart rates in the high 40s and low  50s.   Today the patient walked with a pulse oximetry in place. His maximum  heart rate got up to 90 beats per minute.   IMPRESSION:  1. Bradycardia.  2. Frequent premature ventricular contractions both  of which appear to      be for the most part asymptomatic.  3. Melanoma with recurrence.  4. Chronic Coumadin therapy.  5. History of stroke.  6. Chronic obstructive pulmonary disease on CPAP.   DISCUSSION:  The patient may have very mild chronotropic incompetence  but based on his advanced age and his lack of specific symptoms for  bradycardia, I think pacemaker insertion would be contraindicated at the  present time. Rather I think it would be okay for him to proceed with  melanoma surgery as need be with his risk for cardiovascular  complication fairly small. I will plan to see him back should he develop  symptomatic bradycardia.     Doylene Canning. Ladona Ridgel, MD  Electronically Signed    GWT/MedQ  DD: 12/27/2006  DT: 12/27/2006  Job #: [id number]   cc:   Charlaine Dalton. [name], MD, FCCP

## 2011-04-23 NOTE — Assessment & Plan Note (Signed)
[facility]                             PULMONARY OFFICE NOTE   NAME:[name], [name] COSTABILE                       MRN:          [id number]  DATE:11/28/2006                            DOB:          1919-10-23    HISTORY:  This is a very frail, 77 year old white male with  hypertension, chronic relative bradycardia (for which he has previously  seen Dr. Glennon Hamilton) and new onset aching all over at the end of  November, that I thought initially might be a viral syndrome, but did  not really improve and had a marked elevation of sed rate.  I asked him  to stop Zocor on November 16, 2006, despite the fact that he had normal  LFTs and normal CPK.  He states that all of the aching is gone.  He no  longer needs any Tylenol.  Denies any fever, significant ocular  complaints or jaw claudication.   In addition to these issues, he tells me that he has been diagnosed with  multiple melanomas, for which he is considering surgery at [facility] (he  has previous history of a level 4 melanoma in 1999, but did not have any  evidence of recurrent disease on his recent evaluation including a chest  x-ray done within the last several weeks not available for review  today).   His medications at this point consist only of Coumadin and ranitidine.   The patient mainly complains of fatigue now with no pain, no fever,  appetite is okay.   PHYSICAL EXAMINATION:  He is a frail, elderly white male who walks with  a walker, with a pulse rate that is in the 30 to 40 range, but a blood  pressure that is 104/60 when he stands.  HEENT:  Is unremarkable.  Pharynx clear.  LUNGS:  Fields are clear bilaterally.  HEART:  There is a regular rhythm with frequent premature beats noted.  ABDOMEN:  Soft, benign.  EXTREMITIES:  Without calf tenderness, cyanosis or clubbing.  He has  moderate right-sided hemiparesis chronically.   LABS:  Indicate that his sed rate had improved from 101 down to 92  on  his previous visit dated December 18.   IMPRESSION:  1. Marked improvement in myalgias/arthralgias since he stopped taking      Zocor.  I am not convinced that this was the cause of the elevation      of sed rate, but it is fairly clear that the statins were the cause      of the muscle aches, despite the normal CPK.  I might consider      rechallenging him at a later date with Zetia and/or niacin, but for      now, until the smoke clears I am going to leave him off of this      medicine.  2. The elevation in sed rate does suggest polymyalgia rheumatica in      the absence of another explanation for it.  However, he tells me      now that he has been diagnosed with recurrent melanoma and I  suppose that might be the cause.  In any case, given all of the      above issues, I do not think empiric steroids are a good idea here,      especially since he is feeling better, except his fatigue and I      am going to recommend that we hold off on steroids and might even      consider doing a temporal artery biopsy before considering this      option (giving him steroids would immunocompromise him in the face      of melanoma which might well aggravate the problem) and it is not      clear to me at all that this is metastatic disease, but rather      relatively well localized at present.  3. Finally, the fatigue issue is a chronic one dating at least back 6      months and is associated with bradycardia.  I believe he probably      needs a pacemaker.  He assures me there is nothing acute about his      weakness and he has no orthostatic complaints, or chest pain,      orthopnea, dyspnea, so I am going to send him back to the      Cardiology Clinic on a first available basis for consideration of      pacemaker placement.  4. Followup will be every 6 weeks, sooner if needed.     Charlaine Dalton. [name], MD, [facility]  Electronically Signed    MBW/MedQ  DD: 11/28/2006  DT: 11/28/2006  Job #:  [id number]

## 2011-04-23 NOTE — Assessment & Plan Note (Signed)
[facility]                 COUMADIN / CHRONIC HEART FAILURE CLINIC NOTE   NAME:[name], [name] MARTELLE                       MRN:          [id number]  DATE:06/23/2007                            DOB:          02-Dec-1919    [name] is scheduled for 2 injections on July 23 by Dr. Ethelene Hal for pain  control. The patient currently takes warfarin for secondary stroke  prevention associated with his previous history. Patient is aware that  there is a significant risk for hematoma formation of the spinal column  associated with use of warfarin around the time of epidural injection. I  have discussed this with Dr. Ladona Ridgel. We have reviewed the chart and have  informed by telephone conversations by Tiffany __________ R.N., and the  patient, that [name] may discontinue his Coumadin on or after the 19  of July and restart after his procedure on July 23. The patient  understands that he is to get an INR checked at his facility on the 22  of July and those results will need to be communicated to [facility]  imaging. The patient will resume his Coumadin and check his INR within 5  to 7 days after the procedure and we will then adjust to goal. Patient  understands that if there is any symptomatology associated with ischemia  or stroke like symptoms the patient will contact the emergency services  immediately. The patient verbalizes understanding of this plan and  process. The patient will call with questions or problems in the  meantime. He has contact numbers.      Shelby Dubin, PharmD, BCPS, CPP  Electronically Signed      Doylene Canning. Ladona Ridgel, MD  Electronically Signed   MP/MedQ  DD: 06/23/2007  DT: 06/23/2007  Job #: [id number]

## 2011-04-23 NOTE — Assessment & Plan Note (Signed)
[facility]                             PULMONARY OFFICE NOTE   NAME:[name], [name]                       MRN:          [id number]  DATE:02/10/2007                            DOB:          1919/08/24    HISTORY OF PRESENT ILLNESS:  The patient is an 77 year old white male  patient of Dr. Thurston Hole who has a known history of hypertension, previous  stroke with right-sided hemiparesis, on chronic Coumadin therapy.  The  patient presents today complaining that approximately 1 week ago he had  an episode where he had a coughing spell, and shortly after that, he  started to speak, and was unable to get his words out for about 30  seconds.  Patient has had this kind of episode in the past, about 4 or 5  months ago.  Patient denied any associated visual changes, chest pain,  palpitations, nausea, vomiting, or extremity weakness.  Patient does  complain, at times, that he has recurrent hoarseness, and at times has  mild difficulty swallowing and coughing with eating.  Patient is  maintained on ranitidine twice daily.   PAST MEDICAL HISTORY:  Reviewed.   CURRENT MEDICATIONS:  Reviewed.   PHYSICAL EXAMINATION:  Patient is a an elderly male in no acute  distress, afebrile, stable vitals signs.  O2 saturations 98% on room  air.  HEENT:  Posterior pharynx is clear.  No exudate is noted.  NECK:  Supple without cervical adenopathy.  The lungs sounds are clear to auscultation without any wheezing or  crackles.  CARDIAC:  Regular rate and rhythm.  ABDOMEN:  Soft, nontender.  EXTREMITIES:  Warm without any calf tenderness, cyanosis, clubbing.  There is trace edema bilaterally.  Patient does have noted right  hemiparesis, and along the left forearm is a noted surgical scar.  NEURO:  Patient is alert and oriented x3.  Normal strength of the left  upper and lower extremity.   IMPRESSION/PLAN:  Episode of disturbed speech, question of etiology.  Patient is  complaining of some difficulty with hoarseness and swallowing  issues, which could be some underlying reflux, possibly some dysphagia  secondary to his previous stroke.  Patient will begin Protonix 40 mg  daily and use ranitidine at bedtime.  He will recheck here in 2 weeks  with Dr. [name].  If symptoms persist, may need referral to  gastroenterology for further evaluation and treatment options.  Patient  does not appear to have any new neurological deficits, and has been  therapeutic on his Coumadin level this week.  Patient will continue to  monitor symptoms closely.  If symptoms persist, may also need a referral to neurology for further  evaluation and treatment options.      Rubye Oaks, NP  Electronically Signed      Charlaine Dalton. [name], MD, [facility]  Electronically Signed   TP/MedQ  DD: 02/10/2007  DT: 02/11/2007  Job #: [id number]

## 2011-04-26 ENCOUNTER — Ambulatory Visit (INDEPENDENT_AMBULATORY_CARE_PROVIDER_SITE_OTHER): Payer: Self-pay | Admitting: Cardiology

## 2011-04-26 DIAGNOSIS — R0989 Other specified symptoms and signs involving the circulatory and respiratory systems: Secondary | ICD-10-CM

## 2011-04-26 LAB — POCT INR: INR: 2.2

## 2011-05-04 ENCOUNTER — Emergency Department (HOSPITAL_COMMUNITY): Payer: Medicare Other

## 2011-05-04 ENCOUNTER — Inpatient Hospital Stay (HOSPITAL_COMMUNITY)
Admission: EM | Admit: 2011-05-04 | Discharge: 2011-05-10 | DRG: 470 | Disposition: A | Discharge status: Skilled Nursing Facility | Payer: Medicare Other | Attending: Internal Medicine | Admitting: Internal Medicine

## 2011-05-04 DIAGNOSIS — Z66 Do not resuscitate: Secondary | ICD-10-CM | POA: Diagnosis present

## 2011-05-04 DIAGNOSIS — Z85820 Personal history of malignant melanoma of skin: Secondary | ICD-10-CM

## 2011-05-04 DIAGNOSIS — E669 Obesity, unspecified: Secondary | ICD-10-CM | POA: Diagnosis present

## 2011-05-04 DIAGNOSIS — K219 Gastro-esophageal reflux disease without esophagitis: Secondary | ICD-10-CM | POA: Diagnosis present

## 2011-05-04 DIAGNOSIS — S72009A Fracture of unspecified part of neck of unspecified femur, initial encounter for closed fracture: Principal | ICD-10-CM | POA: Diagnosis present

## 2011-05-04 DIAGNOSIS — Z87891 Personal history of nicotine dependence: Secondary | ICD-10-CM

## 2011-05-04 DIAGNOSIS — Z79899 Other long term (current) drug therapy: Secondary | ICD-10-CM

## 2011-05-04 DIAGNOSIS — W010XXA Fall on same level from slipping, tripping and stumbling without subsequent striking against object, initial encounter: Secondary | ICD-10-CM | POA: Diagnosis present

## 2011-05-04 DIAGNOSIS — J4489 Other specified chronic obstructive pulmonary disease: Secondary | ICD-10-CM | POA: Diagnosis present

## 2011-05-04 DIAGNOSIS — G4733 Obstructive sleep apnea (adult) (pediatric): Secondary | ICD-10-CM | POA: Diagnosis present

## 2011-05-04 DIAGNOSIS — I10 Essential (primary) hypertension: Secondary | ICD-10-CM | POA: Diagnosis present

## 2011-05-04 DIAGNOSIS — E785 Hyperlipidemia, unspecified: Secondary | ICD-10-CM | POA: Diagnosis present

## 2011-05-04 DIAGNOSIS — Y9210 Unspecified residential institution as the place of occurrence of the external cause: Secondary | ICD-10-CM | POA: Diagnosis present

## 2011-05-04 DIAGNOSIS — I69959 Hemiplegia and hemiparesis following unspecified cerebrovascular disease affecting unspecified side: Secondary | ICD-10-CM

## 2011-05-04 DIAGNOSIS — IMO0002 Reserved for concepts with insufficient information to code with codable children: Secondary | ICD-10-CM | POA: Diagnosis present

## 2011-05-04 DIAGNOSIS — Z7901 Long term (current) use of anticoagulants: Secondary | ICD-10-CM

## 2011-05-04 DIAGNOSIS — J449 Chronic obstructive pulmonary disease, unspecified: Secondary | ICD-10-CM | POA: Diagnosis present

## 2011-05-04 DIAGNOSIS — Z882 Allergy status to sulfonamides status: Secondary | ICD-10-CM

## 2011-05-04 LAB — DIFFERENTIAL
Basophils Relative: 0 % (ref 0–1)
Lymphs Abs: 1.8 10*3/uL (ref 0.7–4.0)
Monocytes Relative: 9 % (ref 3–12)
Neutro Abs: 6.2 10*3/uL (ref 1.7–7.7)
Neutrophils Relative %: 71 % (ref 43–77)

## 2011-05-04 LAB — PRO B NATRIURETIC PEPTIDE: Pro B Natriuretic peptide (BNP): 333.1 pg/mL (ref 0–450)

## 2011-05-04 LAB — CBC
Hemoglobin: 12.8 g/dL — ABNORMAL LOW (ref 13.0–17.0)
MCH: 30.3 pg (ref 26.0–34.0)
RBC: 4.23 MIL/uL (ref 4.22–5.81)
WBC: 8.8 10*3/uL (ref 4.0–10.5)

## 2011-05-04 LAB — PROTIME-INR
INR: 2.36 — ABNORMAL HIGH (ref 0.00–1.49)
Prothrombin Time: 25.9 seconds — ABNORMAL HIGH (ref 11.6–15.2)

## 2011-05-04 LAB — BASIC METABOLIC PANEL
Calcium: 8.3 mg/dL — ABNORMAL LOW (ref 8.4–10.5)
Chloride: 102 mEq/L (ref 96–112)
Creatinine, Ser: 1.4 mg/dL (ref 0.4–1.5)
GFR calc Af Amer: 58 mL/min — ABNORMAL LOW (ref >60)
GFR calc non Af Amer: 48 mL/min — ABNORMAL LOW (ref >60)

## 2011-05-05 LAB — BASIC METABOLIC PANEL
BUN: 31 mg/dL — ABNORMAL HIGH (ref 6–23)
Chloride: 103 mEq/L (ref 96–112)
Glucose, Bld: 119 mg/dL — ABNORMAL HIGH (ref 70–99)
Potassium: 4.6 mEq/L (ref 3.5–5.1)
Sodium: 139 mEq/L (ref 135–145)

## 2011-05-05 LAB — PROTIME-INR
INR: 2.33 — ABNORMAL HIGH (ref 0.00–1.49)
Prothrombin Time: 25.7 seconds — ABNORMAL HIGH (ref 11.6–15.2)

## 2011-05-06 ENCOUNTER — Inpatient Hospital Stay (HOSPITAL_COMMUNITY): Payer: Medicare Other

## 2011-05-06 LAB — BASIC METABOLIC PANEL
CO2: 26 mEq/L (ref 19–32)
Calcium: 8.3 mg/dL — ABNORMAL LOW (ref 8.4–10.5)
GFR calc Af Amer: 57 mL/min — ABNORMAL LOW (ref >60)
GFR calc non Af Amer: 47 mL/min — ABNORMAL LOW (ref >60)
Glucose, Bld: 115 mg/dL — ABNORMAL HIGH (ref 70–99)
Potassium: 4.4 mEq/L (ref 3.5–5.1)
Sodium: 138 mEq/L (ref 135–145)

## 2011-05-06 LAB — CBC
Hemoglobin: 11.7 g/dL — ABNORMAL LOW (ref 13.0–17.0)
MCH: 29.2 pg (ref 26.0–34.0)
MCV: 92.5 fL (ref 78.0–100.0)
Platelets: 212 10*3/uL (ref 150–400)
RBC: 4.01 MIL/uL — ABNORMAL LOW (ref 4.22–5.81)
WBC: 13.7 10*3/uL — ABNORMAL HIGH (ref 4.0–10.5)

## 2011-05-06 LAB — PROTIME-INR: Prothrombin Time: 17.9 seconds — ABNORMAL HIGH (ref 11.6–15.2)

## 2011-05-07 LAB — BASIC METABOLIC PANEL
CO2: 27 mEq/L (ref 19–32)
Chloride: 104 mEq/L (ref 96–112)
GFR calc Af Amer: >60 mL/min (ref >60)
Glucose, Bld: 146 mg/dL — ABNORMAL HIGH (ref 70–99)
Sodium: 138 mEq/L (ref 135–145)

## 2011-05-07 LAB — CBC
HCT: 34.5 % — ABNORMAL LOW (ref 39.0–52.0)
Hemoglobin: 11 g/dL — ABNORMAL LOW (ref 13.0–17.0)
MCHC: 31.9 g/dL (ref 30.0–36.0)
RBC: 3.73 MIL/uL — ABNORMAL LOW (ref 4.22–5.81)

## 2011-05-07 LAB — PROTIME-INR: INR: 1.34 (ref 0.00–1.49)

## 2011-05-08 LAB — CROSSMATCH: Unit division: 0

## 2011-05-08 LAB — CBC
HCT: 30.9 % — ABNORMAL LOW (ref 39.0–52.0)
Hemoglobin: 9.9 g/dL — ABNORMAL LOW (ref 13.0–17.0)
MCV: 92.2 fL (ref 78.0–100.0)
RBC: 3.35 MIL/uL — ABNORMAL LOW (ref 4.22–5.81)
WBC: 10.3 10*3/uL (ref 4.0–10.5)

## 2011-05-08 LAB — BASIC METABOLIC PANEL
CO2: 28 mEq/L (ref 19–32)
Chloride: 103 mEq/L (ref 96–112)
Creatinine, Ser: 1.55 mg/dL — ABNORMAL HIGH (ref 0.4–1.5)
GFR calc Af Amer: 51 mL/min — ABNORMAL LOW (ref >60)
Potassium: 4.4 mEq/L (ref 3.5–5.1)
Sodium: 136 mEq/L (ref 135–145)

## 2011-05-08 LAB — PROTIME-INR: INR: 1.4 (ref 0.00–1.49)

## 2011-05-10 LAB — PROTIME-INR
INR: 1.62 — ABNORMAL HIGH (ref 0.00–1.49)
Prothrombin Time: 19.4 seconds — ABNORMAL HIGH (ref 11.6–15.2)

## 2011-05-25 ENCOUNTER — Telehealth: Payer: Self-pay | Admitting: Pharmacist

## 2011-05-25 NOTE — Telephone Encounter (Signed)
Pt recently in hospital for fractured hip.  Now at [facility].  Spoke with Reuel Boom, RN there.  MD at [facility] is following Coumadin.  They will call us when he is going back to [facility].

## 2011-06-02 NOTE — Discharge Summary (Signed)
NAMEFranciscojavier, [name]                ACCOUNT NO.:  [id number]  MEDICAL RECORD NO.:  [id number]           PATIENT TYPE:  I  LOCATION:  3742                         FACILITY:  [facility]  PHYSICIAN:  Peggye Pitt, M.D. DATE OF BIRTH:  05-07-1919  DATE OF ADMISSION:  05/04/2011 DATE OF DISCHARGE:                        DISCHARGE SUMMARY - REFERRING   PATIENT'S PULMONOLOGIST:  Charlaine Dalton. [name], MD, FCCP  PATIENT'S ORTHOPEDIC SURGEON:  Jones Broom, MD  DISCHARGE DIAGNOSES: 1. Right hip femoral neck fracture, status post right hip     hemiarthroplasty on May 06, 2011. 2. Embolic cerebrovascular accident in 2009, with residual right     hemiparesis. 3. History of obstructive sleep apnea. 4. History of melanoma, status post excision. 5. Hypertension. 6. Hyperlipidemia. 7. Obesity.  DISCHARGE MEDICATIONS: 1. Metoprolol 12.5 mg twice daily. 2. Percocet 5/325 mg 1-2 tablets every 4 h. as needed for pain. 3. Coumadin to take 3 mg on Sunday, Tuesday, Wednesday, Thursday,     Saturday and 6 mg on Monday, Friday. 4. Lasix 20 mg daily. 5. Prilosec 20 mg daily. 6. Tylenol 325 mg 1-2 tablets every 8 h. as needed for pain. 7. Zantac 150 mg to take 2 tablets at bedtime.  DISPOSITION AND FOLLOWUP:  [name] will hopefully be discharged to skilled nursing facility today in stable and improved condition, to continue rehab, status post a right hip fracture.  CONSULTATION THIS HOSPITALIZATION:  Dr. Chandler with orthopedics.  IMAGES AND PROCEDURES: 1. CT scan of the head on May 04, 2011, that showed mega cisterna     magna with no acute intracranial abnormality. 2. CT of the C-spine that showed negative for fracture.  Chest x-ray     also on May 04, 2011, that showed cardiomegaly and left basilar     scarring. 3. A right hip x-ray on May 04, 2011, that shows a right femoral neck     fracture with varus angulation.  A right elbow x-ray on May 29,     20 12, that shows no definite acute bony  process.  A pelvic x-ray on     May 06, 2011, that shows new uncomplicated right hip     hemiarthroplasty.  HISTORY AND PHYSICAL:  For full details, please see dictation on May 04, 2011, by Dr. Jomarie Longs, but in brief, [name] is an very pleasant 77 year old Caucasian gentleman with a history of embolic CVA in 2009, who has been maintained on chronic anticoagulation with Coumadin who [facility] who was ambulating using his walker, tripped over a mat on the floor and could not stand up or bear weight on his right leg, was brought to the ED, where x-ray does show evidence for a right hip fracture.  Because of this rest, admitted for further evaluation.  HOSPITAL COURSE BY PROBLEM: 1. Right femoral neck fracture.  He was taken to the OR by Dr.     Ave Filter.  He is now status post a right hip hemiarthroplasty.  He     has done well.  However, PT and OT have evaluated him and believes  that he should be upgraded to a SNF for the next few weeks     following his fracture for intensified rehab purposes. 2. Embolic CVA.  We have restarted his Coumadin.  He has still     subtherapeutic of 1.48.  His Coumadin dosing may need to be     adjusted according to his INRs.  The goal INR for him is between 2-     3. 3. Rest of chronic conditions have been stable.  His vitals on day of     discharge; blood pressure 149/67, heart rate 66, respirations 16,     sats of 95% on room air and temperature of 98.5.     Peggye Pitt, M.D.     EH/MEDQ  D:  05/09/2011  T:  05/09/2011  Job:  [id number]  cc:   Casimiro Needle B. [name], MD, [facility] Jones Broom, MD  Electronically Signed by Peggye Pitt M.D. on 06/02/2011 07:40:51 PM

## 2011-06-12 NOTE — H&P (Signed)
NAME:  [name], [name]                ACCOUNT NO.:  [id number]  MEDICAL RECORD NO.:  [id number]           PATIENT TYPE:  E  LOCATION:  [facility]                         FACILITY:  [facility]  PHYSICIAN:  [name] Cove, MD     DATE OF BIRTH:  September 29, 1919  DATE OF ADMISSION:  05/04/2011 DATE OF DISCHARGE:                             HISTORY & PHYSICAL   PRIMARY CARE PHYSICIAN:  [name] Needle B. [name], MD, FCCP  CHIEF COMPLAINT:  Fall and hip pain.  HISTORY OF PRESENT ILLNESS:  [name] is an 77 year old very pleasant Caucasian gentleman from [facility].  He does have a prior history of CVA and right residual hemiparesis.  He use the walker to ambulate today.  He was ambulating using his walker.  He thinks his walker tripped over a mat on the floor and subsequently fell down, could not stand up and could no bear weight on the right side, brought to the ER for evaluation and found to have a right femur neck fracture.  The patient denies any chest pain, shortness of breath, palpitations, or dizziness prior to this fall.  He denies any dyspnea on exertion or denies chest pain with exertion.  He is really unable to ambulate greater than 700 feet due to his physical condition with hemiparesis.  PAST MEDICAL HISTORY: 1. 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 2. History of obstructive sleep apnea. 3. History of multiple malignant melanoma status post excision. 4. Hypertension. 5. Dyslipidemia. 6. Obesity. 7. History of an episode of bradycardia back in 2008.  This was     detected on a Holter monitor.  PAST SURGICAL HISTORY:  Significant for multiple melanoma resection.  SOCIAL HISTORY:  He is widowed, lives at [facility].  He is a retired principal.  No history of smoking cigarettes, formerly used 2 cigars remotely in the past.  No history of alcohol use.  FAMILY HISTORY:  Due to advanced age, it  is noncontributory.  MEDICATIONS: 1. Coumadin dose unknown. 2. Ranitidine 150 mg daily. 3. Prilosec daily. 4. Lasix daily dose of which are unknown at this point.  ALLERGIES:  SULFA.  REVIEW OF SYSTEMS:  Negative except per HPI.  PHYSICAL EXAMINATION:  VITAL SIGNS:  Temp is 98, pulse 69, blood pressure 158/81, respirations 16, and satting 100% on 2 L. GENERAL:  This is a elderly obese gentleman lying in the stretcher in no acute distress. HEENT:  Pupils are 5 mm reactive to light.  Extraocular movements are intact. NECK:  Obese with no appreciable JVD. CARDIOVASCULAR:  S1, S2.  Regular rate and rhythm. LUNGS:  Clear to auscultation bilaterally.  Mildly decreased bases. ABDOMEN:  Soft, obese, nontender with positive bowel sounds.  No organomegaly. EXTREMITIES:  His right lower extremity is minimally internally rotated and shorter. ABDOMEN:  I am able to appreciate dorsalis pedis pulses.  No signs of distal neurovascular compromise.  LABORATORY DATA:  Review of laboratory data shows white count of 8.8, hemoglobin 12.8, and platelets 242.  Coags shows a PT of 25 an INR of  2.3.  BMET; sodium 140, potassium 4.0, chloride 102, bicarb 28, BUN 33, creatinine 1.4, glucose 102, calcium 8.3, BNP is 330.  IMAGING DATA:  Chest x-ray on May 04, 2011, cardiomegaly, left basilar scarring.  CT of the head on May 04, 2011, no acute intracranial abnormality.  CT C-spine shows moderate degenerative changes.  No fractures.  X-ray of the hip shows right femur neck fracture with varus angulation.  ASSESSMENT:  [name] is an 77 year old gentleman with, 1. Right femur neck fracture. 2. Coagulopathy secondary to Coumadin. 3. History of obstructive sleep apnea. 4. History of cerebrovascular accident with right hemiparesis. 5. Obesity. 6. History of transient bradycardia.  PLAN:  I will admit him to telemetry for the next 24 hours due to trigeminy on EKG and history of transient bradycardia,  otherwise his heart rate is relatively well controlled and no acute changes on EKG.  I suspect he will be low to moderate risk from a cardiac standpoint.  He has no known coronary disease, however we will keep him on the monitor and make sure his electrolytes namely potassium and magnesium are within normal range.  In terms of his coagulopathy, we will give him a small dose of vitamin K p.o. now, hold his Coumadin, repeat his INR in the morning, as well as on May 06, 2011.  [name] with Orthopedic Surgery has already been consulted and is seeing the patient today.  He is presumptively planning for surgery on Thursday.  Further management if condition evolves.  The patient has arrhythmias or any cardiac abnormality through his initial clinical stay.  He will need cardiac evaluation, otherwise they think he is in low to moderate cardiac risk. Further management as condition evolves.  For his sleep apnea, we will continue CPAP at bedtime.     [name] Cove, MD     PJ/MEDQ  D:  05/04/2011  T:  05/04/2011  Job:  [id number]  cc:   [name] Dalton. [name], MD, [facility]  Electronically Signed by [name]  on 06/12/2011 12:47:46 PM

## 2011-06-29 NOTE — Op Note (Signed)
NAME:  [name], Luiz Libero                ACCOUNT NO.:  [id number]  MEDICAL RECORD NO.:  [id number]           PATIENT TYPE:  I  LOCATION:  3742                         FACILITY:  [facility]  PHYSICIAN:  Jones Broom, MD    DATE OF BIRTH:  1919-07-07  DATE OF PROCEDURE:  05/06/2011 DATE OF DISCHARGE:                              OPERATIVE REPORT   PREOPERATIVE DIAGNOSIS:  Right hip femoral neck fracture.  POSTOPERATIVE DIAGNOSIS:  Right hip femoral neck fracture.  PROCEDURE PERFORMED:  Right hip hemiarthroplasty.  ATTENDING SURGEON:  Jones Broom, MD  ASSISTANT:  Skip Mayer, PA-C Carlena Sax was essential in positioning and retraction during the case and was present throughout the procedure).  ANESTHESIA:  GETA.  COMPLICATIONS:  None.  DRAINS:  None.  SPECIMENS:  None.  ESTIMATED BLOOD LOSS:  100 mL.  INDICATIONS FOR SURGERY:  The patient is an 77 year old gentleman who has had a mechanical fall and suffered a right displaced femoral neck fracture 2 days ago.  He was admitted to the hospital on the hospitalist service.  His INR was initially over 2 and surgery was delayed 1 day to allow the INR to come down.  He was given vitamin K.  He was cleared for surgery by the medicine service and understood risks, benefits and alternatives to the procedure including but not limited to risk of bleeding, infection, damage to neurovascular structures, risk of dislocation and potential need for future surgery.  He understood all of that and elected to go ahead.  OPERATIVE FINDINGS:  A [place] cemented size 5 stem was placed with a 53-mm head 0 offset.  PROCEDURE IN DETAILS:  The patient was identified in the preoperative holding area where I personally marked the operative site after verifying site side and procedure with the patient.  He was taken back to the operating room where general anesthesia was induced without complication.  He was placed in lateral decubitus position with  the right side up.  The right lower extremity was then prepped and draped in standard sterile fashion.  The patient did receive IV Ancef prior to the incision.  After the appropriate time-out, an approximately 12-cm incision was made over the posterior third of the greater trochanter. Dissection was carried down to the fascia which was split longitudinally in line with the incision.  The piriformis was tagged and the external rotators were then taken down off the posterior greater trochanter in 1 sheath with the posterior capsule.  The fracture was exposed.  Posterior capsule and external rotators were split just below the piriformis down to the level of the acetabulum.  Great care was taken to protect the sciatic nerve.  The proximal femoral cut was then made using the cut guide and the femoral head was then removed and sized and felt to be 53. The proximal femur was then prepared by first using an intramedullary canal finder, a lateralizer and then sequentially broaching from 1 to 5. Five was felt to be an excellent fit.  The size 53 head with 0 offset neck was then placed and a trial reduction was performed.  It was felt  to be excellent in terms of the soft tissue tension.  I was able to bring up to 90 degrees of flexion and 70 degrees internal rotation without any instability.  Leg lengths were felt to be appropriate.  The hip was dislocated.  The broach was then removed.  The cement restrictor was then placed and the canal was prepared with pulse lavage with a brush.  The size 5 stem was then cemented into place in approximately 20 degrees anteversion.  It was held until the cement was hardened and cool.  The size 53 zero offset head was then placed and impacted.  It was then reduced after ensuring that there was nothing in the acetabulum.  The hip reduced nicely and again it was taken through the trial.  I was able to flex him at 90 degrees, internal rotation to 70 without any  instability.  Soft tissue tension was excellent and lengths were felt to be equal.  The joint was then copiously irrigated with normal saline with pulse lavage and then the external rotators were repaired using Ethibond sutures through the greater trochanter and tied over bone bridge.  The deep fascia was then closed using #1 Vicryl in running fashion proximally and distally.  The skin was then closed using 2-0 Vicryl in deep dermal layer and staples for skin closure.  Sterile dressings were then applied including Mepilex dressing.  The patient was then placed in an abduction pillow, rolled into supine position and extubated.  He was then transferred to the PACU in stable condition.  POSTOPERATIVE PLAN:  He will be readmitted to the Medicine Service.  He will have Lovenox transitioned to Coumadin postoperatively for DVT prophylaxis for a total of 3 weeks.  He will be weightbearing as tolerated with posterior hip precautions.     Jones Broom, MD     JC/MEDQ  D:  05/06/2011  T:  05/07/2011  Job:  [id number]  Electronically Signed by Jones Broom  on 06/29/2011 03:54:54 PM

## 2011-06-29 NOTE — Consult Note (Signed)
NAME:  [name], [name]                ACCOUNT NO.:  [id number]  MEDICAL RECORD NO.:  [id number]           PATIENT TYPE:  I  LOCATION:  3742                         FACILITY:  [facility]  PHYSICIAN:  Jones Broom, MD    DATE OF BIRTH:  [birth year]  DATE OF CONSULTATION:  05/04/2011 DATE OF DISCHARGE:                                CONSULTATION   REASON FOR CONSULTATION:  Evaluation of right hip fracture.  HISTORY OF PRESENT ILLNESS:  [name] is a very pleasant 77 year old gentleman who was in a nursing home facility who had a mechanical fall today in the dining hall.  He landed on his right hip.  He had immediate complaint of pain with inability to ambulate.  He was seen in the [facility] Emergency Department where x-rays revealed hip fracture.  I was consulted for evaluation and management.  At this point, pain is moderate in the right hip.  He had an abrasions at the right elbow with the fall, but no deep pain.  He has had a prior stroke and has weakness on the right side.  He usually walks with a walker.  PAST MEDICAL HISTORY:  Significant for, 1. COPD. 2. Hypertension. 3. Hyperlipidemia. 4. Melanoma. 5. Stroke in 1999.  MEDICATIONS:  Coumadin.  DRUG ALLERGIES:  NKDA.  SOCIAL HISTORY:  Denies tobacco or alcohol use.  No drug use.  He lives in a skilled nursing facility.  REVIEW OF SYSTEMS:  As above, otherwise negative.  PHYSICAL EXAMINATION:  Blood pressure 166/91, pulse 69, respiratory rate 22, temperature 99.0.  On exam, he is lying on hospital gurney in no acute distress.  There is a bandage on his right elbow where there is a small abrasion.  He has significant elbow and hand flexion contractures and right-sided weakness in arm and leg.  He is able to wiggle his toes slightly on the right and endorses normal sensation distally.  He has 2+ dorsalis pedis pulse and capillary refill less than 2 seconds.  There is a pillow beneath the knee.  There is pain with any  attempted hip range of motion.  No knee tenderness.  DIAGNOSTIC STUDIES:  X-rays including one view of the right hip is reviewed, which demonstrates a displaced femoral neck fracture in slight varus.  IMPRESSION:  An 77 year old gentleman with displaced right femoral neck fracture.  PLAN:  He will be indicated for a right hip hemiarthroplasty to promote early ambulation and pain control.  This fracture is not amendable to internal fixation.  His INR is currently elevated to about 2.4.  We will let that come down over the next 24 hours and plan on surgery Thursday. I spoke with Dr. Jomarie Longs about this patient.  She is going to give him some vitamin K as well.  I discussed risks, benefits, and alternatives to surgery with the patient and his son including, but not limited to risk of bleeding, infection, damage to neurovascular structures, risk of dislocation, which may be slightly higher given his history of stroke. They understand and would like to go ahead with surgery.  We will resume his Coumadin immediately postoperatively.  All of their questions were answered to their satisfaction.     Jones Broom, MD     JC/MEDQ  D:  05/04/2011  T:  05/05/2011  Job:  [id number]  Electronically Signed by Jones Broom  on 06/29/2011 03:54:52 PM

## 2011-07-21 ENCOUNTER — Other Ambulatory Visit: Payer: Self-pay | Admitting: Dermatology

## 2011-08-03 ENCOUNTER — Ambulatory Visit (INDEPENDENT_AMBULATORY_CARE_PROVIDER_SITE_OTHER): Payer: Self-pay | Admitting: Cardiology

## 2011-08-03 DIAGNOSIS — R0989 Other specified symptoms and signs involving the circulatory and respiratory systems: Secondary | ICD-10-CM

## 2011-08-10 ENCOUNTER — Telehealth: Payer: Self-pay | Admitting: *Deleted

## 2011-08-10 NOTE — Telephone Encounter (Signed)
Pt's son called to inform us that pt has ran out of his 2.5mg s and 1mg s tablets from [facility] and has restarted his previous dose 3mg s daily except 6mg s on M&F over the weekend he thinks on Sunday. Thus, pt will be on his old regimen for at least a week when his INR is due to be checked on 08/17/11.

## 2011-08-17 ENCOUNTER — Ambulatory Visit (INDEPENDENT_AMBULATORY_CARE_PROVIDER_SITE_OTHER): Payer: Self-pay | Admitting: Cardiovascular Disease

## 2011-08-17 DIAGNOSIS — R0989 Other specified symptoms and signs involving the circulatory and respiratory systems: Secondary | ICD-10-CM

## 2011-08-17 LAB — POCT INR: INR: 2.7

## 2011-08-25 ENCOUNTER — Encounter (INDEPENDENT_AMBULATORY_CARE_PROVIDER_SITE_OTHER): Payer: Medicare Other | Admitting: Surgery

## 2011-09-01 ENCOUNTER — Encounter (INDEPENDENT_AMBULATORY_CARE_PROVIDER_SITE_OTHER): Payer: Self-pay | Admitting: General Surgery

## 2011-09-01 ENCOUNTER — Encounter (INDEPENDENT_AMBULATORY_CARE_PROVIDER_SITE_OTHER): Payer: Self-pay | Admitting: Surgery

## 2011-09-01 ENCOUNTER — Other Ambulatory Visit (INDEPENDENT_AMBULATORY_CARE_PROVIDER_SITE_OTHER): Payer: Self-pay | Admitting: General Surgery

## 2011-09-01 ENCOUNTER — Other Ambulatory Visit: Payer: Self-pay | Admitting: Internal Medicine

## 2011-09-01 ENCOUNTER — Ambulatory Visit (INDEPENDENT_AMBULATORY_CARE_PROVIDER_SITE_OTHER): Payer: Medicare Other | Admitting: Surgery

## 2011-09-01 VITALS — BP 138/72 | HR 64 | Temp 97.1°F | Resp 14 | Ht 67.0 in | Wt 178.0 lb

## 2011-09-01 DIAGNOSIS — Z85820 Personal history of malignant melanoma of skin: Secondary | ICD-10-CM

## 2011-09-01 DIAGNOSIS — C439 Malignant melanoma of skin, unspecified: Secondary | ICD-10-CM

## 2011-09-01 NOTE — Assessment & Plan Note (Signed)
[name] has had multiple melanomas of the left lower extremity and left forearm excised by me in 2008 and 2009.  He presents with a new area anteriorally on his shin that was biopsied by Janalyn Harder and is melanoma

## 2011-09-01 NOTE — Progress Notes (Signed)
[name] comes in with his son because of a bleeding recurrent melanoma. This is located anterior on his left shin. It could be a satellite nodule. I last operated on him in 2009 where removed a melanoma proximally on his left medial leg and also one on his left thigh. In 2008 her removed another melanoma from his left leg and also one from his left arm.  He has a 1 cm bleeding granular area there will need wide excision with likely skin grafting. I will set him up to do at time. Last year he had a half hip replacement on the right side. He's still wheelchair-bound.  HEENT unremarkable.  Neck no bruits Chest clear to auscultation  Heart sinus rhythm no murmurs. Abdomen nontender left leg as described above.  Plan we'll schedule excision and grafting. They are aware of the risk. They would like to get back to having Francella Solian as his primary care doctor.

## 2011-09-03 LAB — GLUCOSE, CAPILLARY: Glucose-Capillary: 100 — ABNORMAL HIGH

## 2011-09-06 LAB — CBC
MCV: 94.3
Platelets: 324
WBC: 10.6 — ABNORMAL HIGH

## 2011-09-06 LAB — BASIC METABOLIC PANEL
BUN: 35 — ABNORMAL HIGH
Creatinine, Ser: 1.35
GFR calc non Af Amer: 50 — ABNORMAL LOW

## 2011-09-07 ENCOUNTER — Ambulatory Visit (INDEPENDENT_AMBULATORY_CARE_PROVIDER_SITE_OTHER): Payer: Self-pay | Admitting: Internal Medicine

## 2011-09-07 DIAGNOSIS — R0989 Other specified symptoms and signs involving the circulatory and respiratory systems: Secondary | ICD-10-CM

## 2011-09-07 LAB — PROTIME-INR: Prothrombin Time: 14.9

## 2011-09-07 LAB — POCT INR: INR: 2.9

## 2011-09-07 LAB — APTT: aPTT: 38 — ABNORMAL HIGH

## 2011-09-20 ENCOUNTER — Encounter: Payer: Self-pay | Admitting: Internal Medicine

## 2011-09-20 ENCOUNTER — Other Ambulatory Visit (INDEPENDENT_AMBULATORY_CARE_PROVIDER_SITE_OTHER): Payer: Medicare Other

## 2011-09-20 ENCOUNTER — Ambulatory Visit (INDEPENDENT_AMBULATORY_CARE_PROVIDER_SITE_OTHER)
Admission: RE | Admit: 2011-09-20 | Discharge: 2011-09-20 | Disposition: A | Discharge status: Home / Self Care | Payer: Medicare Other | Source: Ambulatory Visit | Attending: Internal Medicine | Admitting: Internal Medicine

## 2011-09-20 ENCOUNTER — Ambulatory Visit (INDEPENDENT_AMBULATORY_CARE_PROVIDER_SITE_OTHER): Payer: Medicare Other | Admitting: Internal Medicine

## 2011-09-20 VITALS — BP 150/76 | HR 51 | Temp 98.6°F | Ht 65.0 in | Wt 171.4 lb

## 2011-09-20 DIAGNOSIS — I6350 Cerebral infarction due to unspecified occlusion or stenosis of unspecified cerebral artery: Secondary | ICD-10-CM

## 2011-09-20 DIAGNOSIS — R609 Edema, unspecified: Secondary | ICD-10-CM

## 2011-09-20 DIAGNOSIS — E785 Hyperlipidemia, unspecified: Secondary | ICD-10-CM

## 2011-09-20 DIAGNOSIS — R059 Cough, unspecified: Secondary | ICD-10-CM

## 2011-09-20 DIAGNOSIS — I10 Essential (primary) hypertension: Secondary | ICD-10-CM

## 2011-09-20 DIAGNOSIS — R05 Cough: Secondary | ICD-10-CM

## 2011-09-20 DIAGNOSIS — I639 Cerebral infarction, unspecified: Secondary | ICD-10-CM

## 2011-09-20 LAB — CBC WITH DIFFERENTIAL/PLATELET
Basophils Absolute: 0 10*3/uL (ref 0.0–0.1)
Basophils Relative: 0.4 % (ref 0.0–3.0)
Eosinophils Absolute: 0 10*3/uL (ref 0.0–0.7)
Lymphocytes Relative: 19.7 % (ref 12.0–46.0)
MCHC: 32.9 g/dL (ref 30.0–36.0)
Neutrophils Relative %: 71.5 % (ref 43.0–77.0)
Platelets: 307 10*3/uL (ref 150.0–400.0)
RBC: 4.3 Mil/uL (ref 4.22–5.81)
RDW: 13.6 % (ref 11.5–14.6)

## 2011-09-20 LAB — BASIC METABOLIC PANEL
BUN: 31 mg/dL — ABNORMAL HIGH (ref 6–23)
CO2: 29 mEq/L (ref 19–32)
Calcium: 8.8 mg/dL (ref 8.4–10.5)
Creatinine, Ser: 1.2 mg/dL (ref 0.4–1.5)

## 2011-09-20 NOTE — Progress Notes (Signed)
Subjective:     Patient ID: [name], male   DOB: March 31, 1919, 77 y.o.   MRN: [id number]  HPI  89 yowm status post devastating stroke involving the right brain stem in 1999. He has been maintained on Coumadin since then and mainly complains of intermittent dizziness dating all the way back to his stroke which is controlled on p.r.n. meclizine.  seen 04/19/08 for fu ok except ? recurrent melanoma left leg   August 26, 2008 cleared for melanoma surgery by Cards     December 02, 2010--Presents for a 4 week follow up. Last ov spironolactone was stopped due to gynecomastia. He was started on Lasix however he stopped this 2 weeks ago becuase it makes him urinate too much. We discussed several options on diuretics and explained that the spironolactone can cause gynecomastia. Would also like a note for therapy for his right hand. Previous therapy in past has helped.>stoppped spironolactone.   January 14, 2011 -- Pt c/o increased swelling, heat, redness in left leg, swelling and redness in left eyex 3-4 days. Pt states breathing has been doing well. Leg redness is getting worse.No increased swelling but tender and red along posterior left leg. He is planning on eye surgery for drooping lower eye lid. However over last week withi increased redness and drainage . THis is his blind eye. . January 18, 2011 --Returns today for persistent symptoms of lower extremity edema. Seen last week with lower extremity edema w/ suspected cellulits .Tx w/ Keflex. Wound cx returned with no growth. Says he is no better and leg is more swollen and red. He feels okay with no fever or change in appetite. NO n/v/. Denies chest pain, orthopnea, hemoptysis, fever, n/v/d, edema, headache.Labs last visit w/CBC and BNP were unrevealing. Finsh Keflex  Wash area with soap and water , pat dry  We are setting you up for a venous doppler of your left leg > neg dvt  Apply beclamethosone to leg two times a day for 1week.   09/20/2011  f/u ov/Ahmani Prehn cc here for sugery clearance.  Bed > w/c but doing his own transfer due to right leg with problems with rehab R Femur fx May 2012.   Pt denies any significant sore throat, dysphagia, itching, sneezing, nasal congestion or excess secretions, fever, chills, sweats, unintended wt loss, pleuritic or exertional cp, hempoptysis, change in activity tolerance orthopnea pnd or leg swelling Pt also denies any obvious fluctuation in symptoms with weather or environmental change or other alleviating or aggravating factors.   Past Medical History:  BLINDNESS, ONE EYE (ICD-369.60)  CVA  SINUS BRADYCARDIA (ICD-427.81)  DIVERTICULOSIS, COLON (ICD-562.10)  Internal hemorrhoids  COUGH, CHRONIC (ICD-786.2)  PVD (ICD-443.9)  HYPERLIPIDEMIA (ICD-272.4)  HYPERTENSION (ICD-401.9)  - breast tenderness on aldactone August 25, 2010 > d/c October 29, 2010  Hyperkalemia on aldactone October 14, 2008 > d/c October 28, 2010 due to gynecomastia  Melanoma, skin graft 2003..............................................Marland KitchenJorja Loa Daphine [name] (surgery) - repeat skin graft 10/09  L Leg swelling  - Venous dopplers neg 01/18/11  COPD  OSA on CPAP  GERD  Health Maintenance ..................................................................Marland KitchenWert  - Td 03/2005  - Pneumovax 1998     Review of Systems     Objective:   Physical Exam very frail amb wm walks with cane  193 July 22, 2010 > 193 August 25, 2010 >  >190 12/02/10>193 January 13, 2011 > 09/20/2011  GEN: A/Ox3; pleasant , NAD  HEENT: [place]/AT, , EACs-clear, TMs-wnl, NOSE-clear, THROAT-clear, Left eye lower lid cleared  up  NECK: Supple w/ fair ROM; no JVD; normal carotid impulses w/o brui.ts; no thyromegaly or nodules palpated; no lymphadenopathy.  RESP Clear to P & A; w/o, wheezes/ rales/ or rhonchi.  CARD: RRR, no m/r/g  GI: Soft & nt; nml bowel sounds; no organomegaly or masses detected.  Musco: Warm bil, no calf tenderness edema, clubbing, pulses  intact,  along left lower leg, previous scar from melanoma surgery- no longer has any redness along posteriior calf with scaly skin. venous insufficiency changes, stasis dermatic changes trace edema only     CXR  09/20/2011 :  No evidence of acute cardiopulmonary disease.  Stable cardiomegaly.   Assessment:         Plan:

## 2011-09-20 NOTE — Patient Instructions (Addendum)
Ok for leg surgery but need to stop the coumadin for 5 days before and you will need to be checked immediately before surgery to make sure you clot properly   Please schedule a follow up visit in 3 months but call sooner if needed

## 2011-09-21 ENCOUNTER — Telehealth: Payer: Self-pay | Admitting: Internal Medicine

## 2011-09-21 ENCOUNTER — Encounter: Payer: Self-pay | Admitting: Internal Medicine

## 2011-09-21 NOTE — Telephone Encounter (Signed)
Call patient : Studies are unremarkable, no change in recs    [facility]

## 2011-09-21 NOTE — Assessment & Plan Note (Signed)
Adequate control on present rx, reviewed  

## 2011-09-21 NOTE — Assessment & Plan Note (Signed)
I had an extended discussion with the patient and son  today lasting 15 to 20 minutes of a 25 minute visit on the following issues:   The main risk of surgery is cva/ MI off coumadin perioperatively but I believe this is a reasonable risk given the threat of melanoma locally recurrent and potentially metastatic so stop coumadin x 5 days and prob needs INR checked immediately preop.  Cleared for surgery

## 2011-09-22 NOTE — Telephone Encounter (Signed)
Pt is aware results are unremarkable per MW and no changes in any recs.

## 2011-09-23 ENCOUNTER — Telehealth: Payer: Self-pay | Admitting: Internal Medicine

## 2011-09-23 NOTE — Telephone Encounter (Signed)
After reviewing pt's chart, looks like [name] was trying to call pt to discuss lab results.  [id number]

## 2011-09-24 NOTE — Progress Notes (Signed)
Quick Note:  Pt is aware of MW recs. See phone note from 09-21-2011  ______

## 2011-09-24 NOTE — Telephone Encounter (Signed)
[id number] 

## 2011-09-24 NOTE — Telephone Encounter (Signed)
Son is returning Leslie's call.

## 2011-09-24 NOTE — Telephone Encounter (Signed)
Per phone note from 09-21-2011 pt already has been informed of recs.

## 2011-09-28 ENCOUNTER — Ambulatory Visit (INDEPENDENT_AMBULATORY_CARE_PROVIDER_SITE_OTHER): Payer: Self-pay | Admitting: Cardiology

## 2011-09-28 DIAGNOSIS — R0989 Other specified symptoms and signs involving the circulatory and respiratory systems: Secondary | ICD-10-CM

## 2011-09-28 DIAGNOSIS — I639 Cerebral infarction, unspecified: Secondary | ICD-10-CM

## 2011-09-28 DIAGNOSIS — Z7901 Long term (current) use of anticoagulants: Secondary | ICD-10-CM

## 2011-09-28 LAB — POCT INR: INR: 2

## 2011-10-08 ENCOUNTER — Encounter (HOSPITAL_COMMUNITY): Payer: Self-pay

## 2011-10-08 ENCOUNTER — Other Ambulatory Visit: Payer: Self-pay

## 2011-10-08 ENCOUNTER — Encounter (HOSPITAL_COMMUNITY)
Admission: RE | Admit: 2011-10-08 | Discharge: 2011-10-08 | Disposition: A | Discharge status: Home / Self Care | Payer: Medicare Other | Source: Ambulatory Visit | Attending: Surgery | Admitting: Surgery

## 2011-10-08 LAB — BASIC METABOLIC PANEL
CO2: 29 mEq/L (ref 19–32)
Calcium: 9.3 mg/dL (ref 8.4–10.5)
Creatinine, Ser: 1.16 mg/dL (ref 0.50–1.35)
GFR calc non Af Amer: 54 mL/min — ABNORMAL LOW (ref >90)
Sodium: 138 mEq/L (ref 135–145)

## 2011-10-08 LAB — CBC
MCH: 28.8 pg (ref 26.0–34.0)
MCV: 93 fL (ref 78.0–100.0)
Platelets: 266 10*3/uL (ref 150–400)
RBC: 4.13 MIL/uL — ABNORMAL LOW (ref 4.22–5.81)
RDW: 13.3 % (ref 11.5–15.5)
WBC: 7 10*3/uL (ref 4.0–10.5)

## 2011-10-08 LAB — PROTIME-INR: Prothrombin Time: 18.9 seconds — ABNORMAL HIGH (ref 11.6–15.2)

## 2011-10-08 MED ORDER — CEFAZOLIN SODIUM 1-5 GM-% IV SOLN: 1.0000 g | INTRAVENOUS | Status: DC

## 2011-10-08 NOTE — Pre-Procedure Instructions (Signed)
20 [address] M Brancato  10/08/2011   Your procedure is scheduled on: Oct 11, 2011 Monday  Report to [facility] at 0930 AM.  Call this number if you have problems the morning of surgery: [phone]   Remember:   Do not eat food:After Midnight.  Do not drink clear liquids: 4 Hours before arrival.  Take these medicines the morning of surgery with A SIP OF WATER: prilosec, zantac, stop coumadin   Do not wear jewelry, make-up or nail polish.  Do not wear lotions, powders, or perfumes. You may wear deodorant.  Do not shave 48 hours prior to surgery.  Do not bring valuables to the hospital.  Contacts, dentures or bridgework may not be worn into surgery.  Leave suitcase in the car. After surgery it may be brought to your room.  For patients admitted to the hospital, checkout time is 11:00 AM the day of discharge.   Patients discharged the day of surgery will not be allowed to drive home.  Name and phone number of your driver: NA  Special Instructions: CHG Shower Use Special Wash: 1/2 bottle night before surgery and 1/2 bottle morning of surgery.   Please read over the following fact sheets that you were given: Pain Booklet, MRSA Information and Surgical Site Infection Prevention   

## 2011-10-08 NOTE — Pre-Procedure Instructions (Addendum)
20 STEEN BISIG  10/08/2011   Your procedure is scheduled on: Oct 11, 2011 Monday  Report to [facility] at 0930 AM.  Call this number if you have problems the morning of surgery: [phone]   Remember:   Do not eat food:After Midnight.  Do not drink clear liquids: 4 Hours before arrival.  Take these medicines the morning of surgery with A SIP OF WATER: prilosec, zantac, stop coumadin   Do not wear jewelry, make-up or nail polish.  Do not wear lotions, powders, or perfumes. You may wear deodorant.  Do not shave 48 hours prior to surgery.  Do not bring valuables to the hospital.  Contacts, dentures or bridgework may not be worn into surgery.  Leave suitcase in the car. After surgery it may be brought to your room.  For patients admitted to the hospital, checkout time is 11:00 AM the day of discharge.   Patients discharged the day of surgery will not be allowed to drive home.  Name and phone number of your driver: NA  Special Instructions: CHG Shower Use Special Wash: 1/2 bottle night before surgery and 1/2 bottle morning of surgery.   Please read over the following fact sheets that you were given: Pain Booklet, MRSA Information and Surgical Site Infection Prevention

## 2011-10-10 MED ORDER — CEFAZOLIN SODIUM 1-5 GM-% IV SOLN
1.0000 g | Freq: Once | INTRAVENOUS | Status: DC
  Filled 2011-10-10: qty 50

## 2011-10-10 MED ORDER — HEPARIN SODIUM (PORCINE) 5000 UNIT/ML IJ SOLN: 5000.0000 [IU] | Freq: Once | INTRAMUSCULAR | Status: DC

## 2011-10-11 ENCOUNTER — Encounter (HOSPITAL_COMMUNITY): Payer: Self-pay | Admitting: Anesthesiology

## 2011-10-11 ENCOUNTER — Other Ambulatory Visit (INDEPENDENT_AMBULATORY_CARE_PROVIDER_SITE_OTHER): Payer: Self-pay | Admitting: Surgery

## 2011-10-11 ENCOUNTER — Encounter (HOSPITAL_COMMUNITY): Payer: Self-pay | Admitting: *Deleted

## 2011-10-11 ENCOUNTER — Encounter (HOSPITAL_COMMUNITY): Payer: Self-pay

## 2011-10-11 ENCOUNTER — Inpatient Hospital Stay (HOSPITAL_COMMUNITY): Payer: Medicare Other

## 2011-10-11 ENCOUNTER — Encounter (HOSPITAL_COMMUNITY)
Admission: RE | Disposition: A | Discharge status: Skilled Nursing Facility | Payer: Self-pay | Source: Ambulatory Visit | Attending: Surgery

## 2011-10-11 ENCOUNTER — Inpatient Hospital Stay (HOSPITAL_COMMUNITY)
Admission: RE | Admit: 2011-10-11 | Discharge: 2011-10-13 | DRG: 578 | Disposition: A | Discharge status: Skilled Nursing Facility | Payer: Medicare Other | Source: Ambulatory Visit | Attending: Surgery | Admitting: Surgery

## 2011-10-11 DIAGNOSIS — Z79899 Other long term (current) drug therapy: Secondary | ICD-10-CM

## 2011-10-11 DIAGNOSIS — K279 Peptic ulcer, site unspecified, unspecified as acute or chronic, without hemorrhage or perforation: Secondary | ICD-10-CM | POA: Diagnosis present

## 2011-10-11 DIAGNOSIS — J4489 Other specified chronic obstructive pulmonary disease: Secondary | ICD-10-CM | POA: Diagnosis present

## 2011-10-11 DIAGNOSIS — C4370 Malignant melanoma of unspecified lower limb, including hip: Secondary | ICD-10-CM

## 2011-10-11 DIAGNOSIS — K219 Gastro-esophageal reflux disease without esophagitis: Secondary | ICD-10-CM | POA: Diagnosis present

## 2011-10-11 DIAGNOSIS — Z87891 Personal history of nicotine dependence: Secondary | ICD-10-CM

## 2011-10-11 DIAGNOSIS — I10 Essential (primary) hypertension: Secondary | ICD-10-CM | POA: Diagnosis present

## 2011-10-11 DIAGNOSIS — Z8673 Personal history of transient ischemic attack (TIA), and cerebral infarction without residual deficits: Secondary | ICD-10-CM

## 2011-10-11 DIAGNOSIS — Z01812 Encounter for preprocedural laboratory examination: Secondary | ICD-10-CM

## 2011-10-11 DIAGNOSIS — Z96649 Presence of unspecified artificial hip joint: Secondary | ICD-10-CM

## 2011-10-11 DIAGNOSIS — G4730 Sleep apnea, unspecified: Secondary | ICD-10-CM | POA: Diagnosis present

## 2011-10-11 DIAGNOSIS — J449 Chronic obstructive pulmonary disease, unspecified: Secondary | ICD-10-CM | POA: Diagnosis present

## 2011-10-11 DIAGNOSIS — Z882 Allergy status to sulfonamides status: Secondary | ICD-10-CM

## 2011-10-11 DIAGNOSIS — H9190 Unspecified hearing loss, unspecified ear: Secondary | ICD-10-CM | POA: Diagnosis present

## 2011-10-11 DIAGNOSIS — Z7901 Long term (current) use of anticoagulants: Secondary | ICD-10-CM

## 2011-10-11 HISTORY — PX: MELANOMA EXCISION: SHX5266

## 2011-10-11 SURGERY — EXCISION, MELANOMA
Anesthesia: General | Site: Leg Lower | Laterality: Left | Wound class: Clean

## 2011-10-11 MED ORDER — FUROSEMIDE 20 MG PO TABS
20.0000 mg | ORAL_TABLET | Freq: Every day | ORAL | Status: DC
  Administered 2011-10-12: 20 mg via ORAL
  Filled 2011-10-11 (×3): qty 1

## 2011-10-11 MED ORDER — PHENYLEPHRINE HCL 10 MG/ML IJ SOLN
INTRAMUSCULAR | Status: DC | PRN
  Administered 2011-10-11: 80 ug via INTRAVENOUS

## 2011-10-11 MED ORDER — OMEPRAZOLE MAGNESIUM 20 MG PO TBEC: 20.0000 mg | DELAYED_RELEASE_TABLET | ORAL | Status: DC

## 2011-10-11 MED ORDER — SODIUM CHLORIDE 0.9 % IV SOLN
INTRAVENOUS | Status: DC
  Administered 2011-10-11 – 2011-10-12 (×2): via INTRAVENOUS

## 2011-10-11 MED ORDER — EPHEDRINE SULFATE 50 MG/ML IJ SOLN
INTRAMUSCULAR | Status: DC | PRN
  Administered 2011-10-11: 5 mg via INTRAVENOUS
  Administered 2011-10-11: 10 mg via INTRAVENOUS
  Administered 2011-10-11 (×2): 5 mg via INTRAVENOUS

## 2011-10-11 MED ORDER — LACTATED RINGERS IV SOLN
INTRAVENOUS | Status: DC | PRN
  Administered 2011-10-11 (×2): via INTRAVENOUS

## 2011-10-11 MED ORDER — ROCURONIUM BROMIDE 100 MG/10ML IV SOLN
INTRAVENOUS | Status: DC | PRN
  Administered 2011-10-11: 30 mg via INTRAVENOUS

## 2011-10-11 MED ORDER — PANTOPRAZOLE SODIUM 40 MG PO TBEC
40.0000 mg | DELAYED_RELEASE_TABLET | Freq: Every day | ORAL | Status: DC
  Administered 2011-10-11 – 2011-10-12 (×2): 40 mg via ORAL
  Filled 2011-10-11 (×2): qty 1

## 2011-10-11 MED ORDER — WARFARIN SODIUM 3 MG PO TABS: 3.0000 mg | ORAL_TABLET | Freq: Every day | ORAL | Status: DC

## 2011-10-11 MED ORDER — FIBRIN SEALANT COMPONENT 5 ML EX KIT
PACK | CUTANEOUS | Status: DC | PRN
  Administered 2011-10-11: 5 mL via TOPICAL

## 2011-10-11 MED ORDER — NEOSTIGMINE METHYLSULFATE 1 MG/ML IJ SOLN
INTRAMUSCULAR | Status: DC | PRN
  Administered 2011-10-11: 3 mg via INTRAVENOUS

## 2011-10-11 MED ORDER — SODIUM CHLORIDE 0.9 % IR SOLN
Status: DC | PRN
  Administered 2011-10-11: 1000 mL

## 2011-10-11 MED ORDER — PROPOFOL 10 MG/ML IV EMUL
INTRAVENOUS | Status: DC | PRN
  Administered 2011-10-11: 130 mg via INTRAVENOUS

## 2011-10-11 MED ORDER — GLYCOPYRROLATE 0.2 MG/ML IJ SOLN
INTRAMUSCULAR | Status: DC | PRN
  Administered 2011-10-11: .4 mg via INTRAVENOUS

## 2011-10-11 MED ORDER — ONDANSETRON HCL 4 MG/2ML IJ SOLN
INTRAMUSCULAR | Status: DC | PRN
  Administered 2011-10-11: 4 mg via INTRAVENOUS

## 2011-10-11 MED ORDER — HYDROCODONE-ACETAMINOPHEN 5-325 MG PO TABS: 1.0000 | ORAL_TABLET | ORAL | Status: DC | PRN

## 2011-10-11 MED ORDER — FAMOTIDINE 10 MG PO TABS
10.0000 mg | ORAL_TABLET | Freq: Every day | ORAL | Status: DC
  Administered 2011-10-11 – 2011-10-12 (×2): 10 mg via ORAL
  Filled 2011-10-11: qty 1
  Filled 2011-10-11 (×2): qty 0.5

## 2011-10-11 MED ORDER — CEFAZOLIN SODIUM 1-5 GM-% IV SOLN
INTRAVENOUS | Status: DC | PRN
  Administered 2011-10-11: 1 g via INTRAVENOUS

## 2011-10-11 MED ORDER — WARFARIN SODIUM 6 MG PO TABS: 6.0000 mg | ORAL_TABLET | ORAL | Status: DC

## 2011-10-11 MED ORDER — LACTATED RINGERS IV SOLN
INTRAVENOUS | Status: DC
  Administered 2011-10-11: 12:00:00 via INTRAVENOUS

## 2011-10-11 MED ORDER — WARFARIN SODIUM 3 MG PO TABS
3.0000 mg | ORAL_TABLET | ORAL | Status: DC
  Administered 2011-10-12: 3 mg via ORAL
  Filled 2011-10-11 (×2): qty 1

## 2011-10-11 MED ORDER — LIDOCAINE HCL (CARDIAC) 20 MG/ML IV SOLN
INTRAVENOUS | Status: DC | PRN
  Administered 2011-10-11: 100 mg via INTRAVENOUS

## 2011-10-11 MED ORDER — BACITRACIN ZINC 500 UNIT/GM EX OINT
TOPICAL_OINTMENT | CUTANEOUS | Status: DC | PRN
  Administered 2011-10-11: 1 via TOPICAL

## 2011-10-11 SURGICAL SUPPLY — 51 items
BANDAGE ELASTIC 4 VELCRO ST LF (GAUZE/BANDAGES/DRESSINGS) ×2 IMPLANT
BANDAGE GAUZE ELAST BULKY 4 IN (GAUZE/BANDAGES/DRESSINGS) ×2 IMPLANT
BENZOIN TINCTURE PRP APPL 2/3 (GAUZE/BANDAGES/DRESSINGS) IMPLANT
BLADE SURG 10 STRL SS (BLADE) ×2 IMPLANT
BLADE SURG 11 STRL SS (BLADE) ×2 IMPLANT
BLADE SURG 15 STRL LF DISP TIS (BLADE) ×1 IMPLANT
BLADE SURG 15 STRL SS (BLADE) ×1
CANISTER SUCTION 2500CC (MISCELLANEOUS) IMPLANT
CLOTH BEACON ORANGE TIMEOUT ST (SAFETY) ×2 IMPLANT
COVER SURGICAL LIGHT HANDLE (MISCELLANEOUS) ×2 IMPLANT
DECANTER SPIKE VIAL GLASS SM (MISCELLANEOUS) IMPLANT
DERMABOND ADVANCED (GAUZE/BANDAGES/DRESSINGS)
DERMABOND ADVANCED .7 DNX12 (GAUZE/BANDAGES/DRESSINGS) IMPLANT
DRAPE PED LAPAROTOMY (DRAPES) ×2 IMPLANT
DRAPE PROXIMA HALF (DRAPES) ×2 IMPLANT
DRESSING TELFA 8X3 (GAUZE/BANDAGES/DRESSINGS) ×2 IMPLANT
ELECT CAUTERY BLADE 6.4 (BLADE) ×2 IMPLANT
ELECT REM PT RETURN 9FT ADLT (ELECTROSURGICAL) ×2
ELECTRODE REM PT RTRN 9FT ADLT (ELECTROSURGICAL) ×1 IMPLANT
GAUZE SPONGE 4X4 16PLY XRAY LF (GAUZE/BANDAGES/DRESSINGS) ×2 IMPLANT
GLOVE BIO SURGEON STRL SZ 6 (GLOVE) ×2 IMPLANT
GLOVE BIO SURGEON STRL SZ8 (GLOVE) ×2 IMPLANT
GOWN STRL NON-REIN LRG LVL3 (GOWN DISPOSABLE) ×2 IMPLANT
GOWN STRL REIN XL XLG (GOWN DISPOSABLE) ×2 IMPLANT
KIT BASIN OR (CUSTOM PROCEDURE TRAY) ×2 IMPLANT
KIT ROOM TURNOVER OR (KITS) ×2 IMPLANT
NEEDLE HYPO 25X1 1.5 SAFETY (NEEDLE) ×2 IMPLANT
NS IRRIG 1000ML POUR BTL (IV SOLUTION) ×2 IMPLANT
PACK SURGICAL SETUP 50X90 (CUSTOM PROCEDURE TRAY) ×2 IMPLANT
PAD ARMBOARD 7.5X6 YLW CONV (MISCELLANEOUS) ×2 IMPLANT
PENCIL BUTTON HOLSTER BLD 10FT (ELECTRODE) ×2 IMPLANT
SPECIMEN JAR SMALL (MISCELLANEOUS) ×2 IMPLANT
SPONGE GAUZE 4X4 12PLY (GAUZE/BANDAGES/DRESSINGS) ×2 IMPLANT
SPONGE GAUZE 4X4 FOR O.R. (GAUZE/BANDAGES/DRESSINGS) ×4 IMPLANT
SPONGE LAP 18X18 X RAY DECT (DISPOSABLE) ×2 IMPLANT
STAPLER VISISTAT 35W (STAPLE) ×2 IMPLANT
STOCKINETTE IMPERVIOUS LG (DRAPES) ×2 IMPLANT
STRIP CLOSURE SKIN 1/2X4 (GAUZE/BANDAGES/DRESSINGS) IMPLANT
SUT MNCRL AB 4-0 PS2 18 (SUTURE) ×2 IMPLANT
SUT SILK 2 0 FS (SUTURE) ×2 IMPLANT
SUT VIC AB 3-0 SH 18 (SUTURE) ×4 IMPLANT
SUT VIC AB 3-0 SH 27 (SUTURE) ×1
SUT VIC AB 3-0 SH 27XBRD (SUTURE) ×1 IMPLANT
SYR BULB 3OZ (MISCELLANEOUS) ×2 IMPLANT
SYR CONTROL 10ML LL (SYRINGE) ×2 IMPLANT
TAPE HYPAFIX 4 X10 (GAUZE/BANDAGES/DRESSINGS) ×2 IMPLANT
TOWEL OR 17X24 6PK STRL BLUE (TOWEL DISPOSABLE) ×2 IMPLANT
TOWEL OR 17X26 10 PK STRL BLUE (TOWEL DISPOSABLE) ×2 IMPLANT
TUBE CONNECTING 12X1/4 (SUCTIONS) IMPLANT
WATER STERILE IRR 1000ML POUR (IV SOLUTION) IMPLANT
YANKAUER SUCT BULB TIP NO VENT (SUCTIONS) IMPLANT

## 2011-10-11 NOTE — Op Note (Signed)
The patient is a 77-year-old white male who was taken to the OR #2 [facility] on 10/11/2011. After general anesthesia was administered the left leg and lower left abdomen were prepped with the CMX and draped sterilely. A first excised an ellipse of normal tissue in the left lower quadrant and felt this was sufficient for a full-thickness skin graft. The defect was closed with interrupted 3-0 Vicryl and staples.  Next I cut off the stockinette that protected the left lower extremity. There is a nodular mass of the left shin was readily apparent. A previously marked it to get a margin. I placed a suture on the medial margin of silk. I then excised this longitudinally oriented ellipse full thickness. Beneath the lesion I was down on the tibial region.  He was approximated with a single suture from below to minimize the size of the resultant defect. The skin graft had been defatted was then fenestrated with an 11 blade and sutured to the perimeter of the skin with interrupted 3-0 Vicryl's. Tissue seal was used as a tissue glue to hold down the center portion of the graft to the bony periosteal region.  Bacitracin was applied as well as Telfa. It was wrapped with Curlex and with an Ace wrap. The patient was then taken to the recovery room in satisfactory condition. He will be admitted inpatient.

## 2011-10-11 NOTE — Anesthesia Preprocedure Evaluation (Addendum)
Anesthesia Evaluation  Patient identified by MRN, date of birth, ID band Patient awake    Reviewed: Allergy & Precautions, H&P , NPO status , Patient's Chart, lab work & pertinent test results  Airway Mallampati: I TM Distance: >3 FB Neck ROM: Limited    Dental   Pulmonary sleep apnea , Current Smoker,  + rhonchi        Cardiovascular Exercise Tolerance: Poor hypertension, + Peripheral Vascular Disease Regular Normal    Neuro/Psych CVA, Residual Symptoms    GI/Hepatic GERD-  ,  Endo/Other    Renal/GU      Musculoskeletal   Abdominal Normal abdominal exam  (+)   Peds  Hematology   Anesthesia Other Findings   Reproductive/Obstetrics                           Anesthesia Physical Anesthesia Plan  ASA: III  Anesthesia Plan: General   Post-op Pain Management:    Induction: Intravenous  Airway Management Planned: Oral ETT  Additional Equipment:   Intra-op Plan:   Post-operative Plan: Extubation in OR  Informed Consent: I have reviewed the patients History and Physical, chart, labs and discussed the procedure including the risks, benefits and alternatives for the proposed anesthesia with the patient or authorized representative who has indicated his/her understanding and acceptance.   Dental advisory given  Plan Discussed with: CRNA, Anesthesiologist and Surgeon  Anesthesia Plan Comments:         Anesthesia Quick Evaluation

## 2011-10-11 NOTE — Progress Notes (Signed)
Chief Complaint:  Recurrent melanoma of the left leg   History of Present Illness:  [name] is an 77 y.o. male on whom I have removed melanoma from the left leg on several occasions in the past.  He has a new raised 1 cm bx proven melanoma of the left shin.  Will proceed with excision and grafting as there is not enough skin to cover He has had preop clearance by Dr. [name].   Past Medical History  Diagnosis Date  . Cancer   . GERD (gastroesophageal reflux disease)   . Hypertension   . Stroke   . Hearing loss     wears hearing aid - right side  . Leg swelling   . Trouble swallowing   . Bruises easily     due to coumadin  . Weakness     difficulty walking  . Sleep apnea     uses cpap    Past Surgical History  Procedure Date  . Excision of melanoma 2009    left leg  . Melanoma excision     many melanoma removed in past  . Joint replacement     r fem head fx    Medications Prior to Admission  Medication Dose Route Frequency Provider Last Rate Last Dose  . 0.9 %  sodium chloride infusion   Intravenous Continuous Valarie Merino, MD 40 mL/hr at 10/11/11 1758    . DISCONTD: bacitracin ointment    PRN Valarie Merino, MD   1 application at 10/11/11 1405  . DISCONTD: ceFAZolin (ANCEF) IVPB 1 g/50 mL premix  1 g Intravenous Once Valarie Merino, MD      . DISCONTD: fibrin sealant (EVICEL) 5 ML kit    PRN Valarie Merino, MD   5 mL at 10/11/11 1407  . DISCONTD: heparin injection 5,000 Units  5,000 Units Subcutaneous Once Valarie Merino, MD      . DISCONTD: lactated ringers infusion   Intravenous Continuous Rivka Barbara, MD 50 mL/hr at 10/11/11 1143    . DISCONTD: sodium chloride 0.9 % irrigation    PRN Valarie Merino, MD   1,000 mL at 10/11/11 1404   Medications Prior to Admission  Medication Sig Dispense Refill  . furosemide (LASIX) 20 MG tablet Take 20 mg by mouth daily.        Marland Kitchen omeprazole (PRILOSEC OTC) 20 MG tablet        . ranitidine (ZANTAC) 150  MG tablet Take 150 mg by mouth 2 (two) times daily.        Marland Kitchen warfarin (COUMADIN) 6 MG tablet Take 3-6 mg by mouth daily. TAKE 3 MG TABLETS ON TUES, WED, THURS, SAT AND SUN, TAKES 6 MG ON Monday AND FRIDAY         Allergies  Allergen Reactions  . Sulfonamide Derivatives Rash    REACTION: rash     Family History  Problem Relation Age of Onset  . Heart disease Father   . Cancer Son     prostate    Social History:   reports that he quit smoking about 47 years ago. He does not have any smokeless tobacco history on file. He reports that he does not drink alcohol or use illicit drugs.   REVIEW OF SYSTEMS - PERTINENT POSITIVES ONLY: Skin cancer, blindness, age related pulmonary limitations and COPD  Physical Exam:   Blood pressure 163/71, pulse 60, temperature 97.4 F (36.3 C), temperature source Oral, resp. rate 18,  height 5\' 4"  (1.626 m), SpO2 100.00%. There is no weight on file to calculate BMI.  Gen:  No acute distress.  Well nourished and well groomed for age.  Multiple skin lesions  Neurological: Alert and oriented to person, place, and time. Coordination normal.  Head: Normocephalic and atraumatic.  Eyes: Conjunctivae are normal. Pupils are equal, round, and reactive to light. No scleral icterus.  Neck: Normal range of motion. Neck supple. No tracheal deviation or thyromegaly present.  Cardiovascular: Normal rate, regular rhythm, normal heart sounds and intact distal pulses.  Exam reveals no gallop and no friction rub.  No murmur heard. Respiratory: Effort normal.  No respiratory distress. No chest wall tenderness. Breath sounds normal.  No wheezes, rales or rhonchi.  GI: Soft. Bowel sounds are normal. The abdomen is soft and nontender.  There is no rebound and no guarding.  Musculoskeletal: Normal range of motion. Extremities are nontender.  Lymphadenopathy: No cervical, preauricular, postauricular or axillary adenopathy is present Skin: Skin is warm and dry. No rash noted. No  diaphoresis. No erythema. No pallor. No clubbing, cyanosis, or edema.   Psychiatric: Normal mood and affect. Behavior is normal. Judgment and thought content normal.    LABORATORY RESULTS: No results found for this or any previous visit (from the past 48 hour(s)).  RADIOLOGY RESULTS: No results found.  Problem List: Active Problems:  * No active hospital problems. *    Assessment & Plan: Recurrent melanoma of the left leg.  Plan excision and skin grafting.  Will admit postop    Matt B. [name], MD, [facility] [phone] beeper [phone]  10/11/2011 7:54 PM

## 2011-10-11 NOTE — Transfer of Care (Signed)
Immediate Anesthesia Transfer of Care Note  Patient: [name]  Procedure(s) Performed:  MELANOMA EXCISION - EXCISION melanoma left leg with full thickness skin grafting from left lower abdomen.  Patient Location: PACU  Anesthesia Type: General  Level of Consciousness: alert  and oriented  Airway & Oxygen Therapy: Patient Spontanous Breathing and Patient connected to face mask oxygen  Post-op Assessment: Report given to PACU RN, Post -op Vital signs reviewed and stable and Patient moving all extremities X 4  Post vital signs: stable  Complications: No apparent anesthesia complications

## 2011-10-11 NOTE — Progress Notes (Signed)
Chief Complaint:  Recurrent melanoma of the left leg   History of Present Illness:  [name] is an 77 y.o. male on whom I have removed melanoma from the left leg on several occasions in the past.  He has a new raised 1 cm bx proven melanoma of the left shin.  Will proceed with excision and grafting as there is not enough skin to cover He has had preop clearance by Dr. Sherene [name].   Past Medical History  Diagnosis Date  . Cancer   . GERD (gastroesophageal reflux disease)   . Hypertension   . Stroke   . Hearing loss     wears hearing aid - right side  . Leg swelling   . Trouble swallowing   . Bruises easily     due to coumadin  . Weakness     difficulty walking  . Sleep apnea     uses cpap    Past Surgical History  Procedure Date  . Excision of melanoma 2009    left leg  . Melanoma excision     many melanoma removed in past  . Joint replacement     r fem head fx    Medications Prior to Admission  Medication Dose Route Frequency Provider Last Rate Last Dose  . ceFAZolin (ANCEF) IVPB 1 g/50 mL premix  1 g Intravenous Once Valarie Merino, MD      . heparin injection 5,000 Units  5,000 Units Subcutaneous Once Valarie Merino, MD      . lactated ringers infusion   Intravenous Continuous Rivka Barbara, MD 50 mL/hr at 10/11/11 1143     Medications Prior to Admission  Medication Sig Dispense Refill  . furosemide (LASIX) 20 MG tablet Take 20 mg by mouth daily.        Marland Kitchen omeprazole (PRILOSEC OTC) 20 MG tablet        . ranitidine (ZANTAC) 150 MG tablet Take 150 mg by mouth 2 (two) times daily.        Marland Kitchen warfarin (COUMADIN) 6 MG tablet Take 3-6 mg by mouth daily. TAKE 3 MG TABLETS ON TUES, WED, THURS, SAT AND SUN, TAKES 6 MG ON Monday AND FRIDAY         Allergies  Allergen Reactions  . Sulfonamide Derivatives Rash    REACTION: rash     Family History  Problem Relation Age of Onset  . Heart disease Father   . Cancer Son     prostate    Social History:    reports that he quit smoking about 47 years ago. He does not have any smokeless tobacco history on file. He reports that he does not drink alcohol or use illicit drugs.   REVIEW OF SYSTEMS - PERTINENT POSITIVES ONLY: Skin cancer, blindness, age related pulmonary limitations and COPD  Physical Exam:   Blood pressure 153/89, pulse 98, resp. rate 16, SpO2 97.00%. There is no height or weight on file to calculate BMI.  Gen:  No acute distress.  Well nourished and well groomed for age.  Multiple skin lesions  Neurological: Alert and oriented to person, place, and time. Coordination normal.  Head: Normocephalic and atraumatic.  Eyes: Conjunctivae are normal. Pupils are equal, round, and reactive to light. No scleral icterus.  Neck: Normal range of motion. Neck supple. No tracheal deviation or thyromegaly present.  Cardiovascular: Normal rate, regular rhythm, normal heart sounds and intact distal pulses.  Exam reveals no gallop and no friction rub.  No  murmur heard. Respiratory: Effort normal.  No respiratory distress. No chest wall tenderness. Breath sounds normal.  No wheezes, rales or rhonchi.  GI: Soft. Bowel sounds are normal. The abdomen is soft and nontender.  There is no rebound and no guarding.  Musculoskeletal: Normal range of motion. Extremities are nontender.  Lymphadenopathy: No cervical, preauricular, postauricular or axillary adenopathy is present Skin: Skin is warm and dry. No rash noted. No diaphoresis. No erythema. No pallor. No clubbing, cyanosis, or edema.   Psychiatric: Normal mood and affect. Behavior is normal. Judgment and thought content normal.    LABORATORY RESULTS: No results found for this or any previous visit (from the past 48 hour(s)).  RADIOLOGY RESULTS: No results found.  Problem List: Active Problems:  * No active hospital problems. *    Assessment & Plan: Recurrent melanoma of the left leg.  Plan excision and skin grafting.  Will admit  postop    Matt B. [name], MD, [facility] [phone] beeper [phone]  10/11/2011 11:56 AM

## 2011-10-11 NOTE — Brief Op Note (Signed)
10/11/2011  2:11 PM  PATIENT:  [name]  77 y.o. male  PRE-OPERATIVE DIAGNOSIS:  MELANOMA LEFT LEG-BLEEDING  POST-OPERATIVE DIAGNOSIS:  * No post-op diagnosis entered *  PROCEDURE:  Procedure(s): MELANOMA EXCISION  SURGEON:  Surgeon(s): Valarie Merino, MD  PHYSICIAN ASSISTANT:   ASSISTANTS:none    ANESTHESIA:   general  EBL:  Total I/O In: 1000 [I.V.:1000] Out: -   BLOOD ADMINISTERED:none  DRAINS: none   LOCAL MEDICATIONS USED:  NONE  SPECIMEN:  Excision  DISPOSITION OF SPECIMEN:  PATHOLOGY  COUNTS:  YES  TOURNIQUET:  * No tourniquets in log *  DICTATION: .Dragon Dictation  PLAN OF CARE: Admit to inpatient   PATIENT DISPOSITION:  PACU - hemodynamically stable.   Delay start of Pharmacological VTE agent (>24hrs) due to surgical blood loss or risk of bleeding:  no       Procedure excision of melanoma of left shin with full-thickness skin grafting. Surgeon Luretha Murphy  Anesthesia Gen.  Drains none  Complications none  Disposition admit inpatient

## 2011-10-11 NOTE — Anesthesia Postprocedure Evaluation (Signed)
  Anesthesia Post-op Note  Patient: [name]  Procedure(s) Performed:  MELANOMA EXCISION - EXCISION melanoma left leg with full thickness skin grafting from left lower abdomen.  Patient Location: PACU  Anesthesia Type: General  Level of Consciousness: awake and alert   Airway and Oxygen Therapy: Patient Spontanous Breathing and Patient connected to face mask oxygen  Post-op Pain: mild  Post-op Assessment: Post-op Vital signs reviewed, Patient's Cardiovascular Status Stable, Respiratory Function Stable, Patent Airway, No signs of Nausea or vomiting and Pain level controlled  Post-op Vital Signs: stable  Complications: No apparent anesthesia complications

## 2011-10-11 NOTE — Anesthesia Procedure Notes (Signed)
Procedures

## 2011-10-11 NOTE — Preoperative (Signed)
Beta Blockers   Reason not to administer Beta Blockers:Not Applicable 

## 2011-10-11 NOTE — OR Nursing (Signed)
LABS ACCEPTABLE FOR PLANNED SURGERY FOR ANESTHESIA. PER DR Katrinka Blazing

## 2011-10-12 MED ORDER — WARFARIN SODIUM 3 MG PO TABS
3.0000 mg | ORAL_TABLET | Freq: Once | ORAL | Status: AC
  Administered 2011-10-12: 3 mg via ORAL
  Filled 2011-10-12: qty 1

## 2011-10-12 MED ORDER — CHLORHEXIDINE GLUCONATE CLOTH 2 % EX PADS
6.0000 | MEDICATED_PAD | Freq: Every day | CUTANEOUS | Status: DC
  Administered 2011-10-13: 6 via TOPICAL

## 2011-10-12 MED ORDER — MUPIROCIN 2 % EX OINT
1.0000 "application " | TOPICAL_OINTMENT | Freq: Two times a day (BID) | CUTANEOUS | Status: DC
  Administered 2011-10-12 (×2): 1 via NASAL
  Filled 2011-10-12: qty 22

## 2011-10-12 NOTE — Progress Notes (Signed)
CSW received referral for pt from [facility] for placement at dc. Per pt's report, pt is a resident of [facility] [address].  CSW has contacted [facility] [address] [facility] to confirm that pt is able to return to facility at [facility].  CSW has completed assessment (please see shadow chart for detail) and completed FL-2 (MD, please sign FL2 in shadow chart).  CSW to continue to follow to assist with DC plans.

## 2011-10-12 NOTE — H&P (Signed)
Chief Complaint:  Recurrent melanoma of left leg.  On shin  History of Present Illness:  [name] is an 77 y.o. male with a prior history of multiple melanomas of the left arm and leg.  Now with new lesion that is anterior on the lower left shin   Past Medical History  Diagnosis Date  . Cancer   . GERD (gastroesophageal reflux disease)   . Hypertension   . Stroke   . Hearing loss     wears hearing aid - right side  . Leg swelling   . Trouble swallowing   . Bruises easily     due to coumadin  . Weakness     difficulty walking  . Sleep apnea     uses cpap    Past Surgical History  Procedure Date  . Excision of melanoma 2009    left leg  . Melanoma excision     many melanoma removed in past  . Joint replacement     r fem head fx    Medications Prior to Admission  Medication Dose Route Frequency Provider Last Rate Last Dose  . 0.9 %  sodium chloride infusion   Intravenous Continuous Valarie Merino, MD 40 mL/hr at 10/12/11 0600    . famotidine (PEPCID) tablet 10 mg  10 mg Oral Daily Valarie Merino, MD   10 mg at 10/11/11 2121  . furosemide (LASIX) tablet 20 mg  20 mg Oral Daily Valarie Merino, MD      . HYDROcodone-acetaminophen [facility]) 5-325 MG per tablet 1-2 tablet  1-2 tablet Oral Q4H PRN Valarie Merino, MD      . pantoprazole (PROTONIX) EC tablet 40 mg  40 mg Oral Q1200 Valarie Merino, MD   40 mg at 10/11/11 2125  . warfarin (COUMADIN) tablet 3 mg  3 mg Oral Custom Hilario Quarry Amend, PHARMD      . warfarin (COUMADIN) tablet 6 mg  6 mg Oral Custom Hilario Quarry [place]      . DISCONTD: bacitracin ointment    PRN Valarie Merino, MD   1 application at 10/11/11 1405  . DISCONTD: ceFAZolin (ANCEF) IVPB 1 g/50 mL premix  1 g Intravenous Once Valarie Merino, MD      . DISCONTD: fibrin sealant (EVICEL) 5 ML kit    PRN Valarie Merino, MD   5 mL at 10/11/11 1407  . DISCONTD: heparin injection 5,000 Units  5,000 Units Subcutaneous Once Valarie Merino, MD       . DISCONTD: lactated ringers infusion   Intravenous Continuous Rivka Barbara, MD 50 mL/hr at 10/11/11 1143    . DISCONTD: omeprazole (PRILOSEC OTC) EC tablet 20 mg  20 mg Oral QAM Valarie Merino, MD      . DISCONTD: omeprazole (PRILOSEC OTC) EC tablet 20 mg  20 mg Oral Q24H Valarie Merino, MD      . DISCONTD: sodium chloride 0.9 % irrigation    PRN Valarie Merino, MD   1,000 mL at 10/11/11 1404  . DISCONTD: warfarin (COUMADIN) tablet 3-6 mg  3-6 mg Oral Daily Valarie Merino, MD       Medications Prior to Admission  Medication Sig Dispense Refill  . furosemide (LASIX) 20 MG tablet Take 20 mg by mouth daily.        Marland Kitchen omeprazole (PRILOSEC OTC) 20 MG tablet       . ranitidine (ZANTAC) 150 MG tablet Take 150 mg  by mouth 2 (two) times daily.        Marland Kitchen warfarin (COUMADIN) 6 MG tablet Take 3-6 mg by mouth daily. TAKE 3 MG TABLETS ON TUES, WED, THURS, SAT AND SUN, TAKES 6 MG ON Monday AND FRIDAY         Allergies  Allergen Reactions  . Sulfonamide Derivatives Rash    REACTION: rash     Family History  Problem Relation Age of Onset  . Heart disease Father   . Cancer Son     prostate    Social History:   reports that he quit smoking about 47 years ago. He does not have any smokeless tobacco history on file. He reports that he does not drink alcohol or use illicit drugs.   REVIEW OF SYSTEMS - PERTINENT POSITIVES ONLY: Shortness of breath, leg swelling, solar skin changes   Physical Exam:   Blood pressure 128/80, pulse 66, temperature 98 F (36.7 C), temperature source Oral, resp. rate 16, height 5\' 4"  (1.626 m), SpO2 95.00%. There is no weight on file to calculate BMI.  Gen:  No acute distress.  Well nourished and well groomed.   Neurological: Alert and oriented to person, place, and time.  Head: Normocephalic and atraumatic. Solar skin changes Eyes: Conjunctivae are normal. Pupils are equal, round, and reactive to light. No scleral icterus.  Neck: Normal range of  motion. Neck supple. No tracheal deviation or thyromegaly present.  Cardiovascular: Normal rate, regular rhythm, normal heart sounds and intact distal pulses.  Exam reveals no gallop and no friction rub.  No murmur heard. Respiratory: Effort normal.  No respiratory distress. No chest wall tenderness. Breath sounds normal.  No wheezes, rales or rhonchi.  GI: Soft. Bowel sounds are normal. The abdomen is soft and nontender.  There is no rebound and no guarding.  Musculoskeletal: Normal range of motion. Extremities are nontender.  Lymphadenopathy: No cervical, preauricular, postauricular or axillary adenopathy is present Skin: Skin is warm and dry. No rash noted. No diaphoresis. No erythema. No pallor. No clubbing, cyanosis, Edema in lower extremity.   Psychiatric: Normal mood and affect. Behavior is normal. Judgment and thought content normal.    LABORATORY RESULTS: Results for orders placed during the hospital encounter of 10/11/11 (from the past 48 hour(s))  PROTIME-INR     Status: Normal   Collection Time   10/12/11  6:35 AM      Component Value Range Comment   Prothrombin Time 15.0  11.6 - 15.2 (seconds)    INR 1.16  0.00 - 1.49      RADIOLOGY RESULTS: No results found.  Problem List: Active Problems:  * No active hospital problems. *    Assessment & Plan: Recurrent melanoma of the left leg Plan excision and skin grafting    Matt B. [name], MD, [facility] [phone] beeper [phone]  10/12/2011 8:11 AM

## 2011-10-12 NOTE — Progress Notes (Signed)
  1 Day Post-Op  1 Day Post-Op  Subjective: [name] reports no pain in either the donor or graft sites.  Both remain dressed.  He is in 5114.    Objective: Vital signs in last 24 hours: Temp:  [97.4 F (36.3 C)-98.1 F (36.7 C)] 98 F (36.7 C) (11/06 0530) Pulse Rate:  [46-74] 66  (11/06 0530) Resp:  [16-29] 16  (11/06 0530) BP: (123-163)/(54-81) 128/80 mmHg (11/06 0530) SpO2:  [94 %-100 %] 95 % (11/06 0530)   Intake/Output from previous day: 11/05 0701 - 11/06 0700 In: 1546 [I.V.:1546] Out: 800 [Urine:800] Intake/Output this shift: Total I/O In: -  Out: 175 [Urine:175]  Incision/Wound:  Dressed. No problems identified  Lab Results:  No results found for this basename: WBC:2,HGB:2,HCT:2,PLT:2 in the last 72 hours BMET No results found for this basename: NA:2,K:2,CL:2,CO2:2,GLUCOSE:2,BUN:2,CREATININE:2,CALCIUM:2 in the last 72 hours PT/INR  Basename 10/12/11 0635  LABPROT 15.0  INR 1.16    Studies/Results: No results found.  Anti-infectives: Anti-infectives     Start     Dose/Rate Route Frequency Ordered Stop   10/10/11 1515   ceFAZolin (ANCEF) IVPB 1 g/50 mL premix  Status:  Discontinued        1 g 100 mL/hr over 30 Minutes Intravenous  Once 10/10/11 1511 10/11/11 1706          Assessment/Plan: Will get up today.  Restart Coumarin.  Plan discharge tomorrow 1 Day Post-Op    LOS: 1 day    Matt B. [name], MD, [facility] [phone] beeper [phone]  10/12/2011 9:44 AM

## 2011-10-13 NOTE — Progress Notes (Signed)
CSW spoke with [facility].  Pt resides in the Independent level within the facility.  Pt will return to [facility] at [facility].  Son will transport.  CSW phoned [facility] to confirm and provided dc paperwork to son and pt.  CSW signing off.

## 2011-10-13 NOTE — Discharge Summary (Signed)
Admitted 10/11/11 Discharge 10/13/11  Dx: Melanoma of left leg Procedure: Excision of melanoma from left shin and full thickness skin grafting.  Course in Hosp: The 77 year old patient underwent the above operation without difficulty.  He was managed in the hospital where we restarted his coumadin and watched him for evidence of bleeding.  He was ready for discharge on pd #2.  Return to my office in 2 days Condition  good

## 2011-10-14 ENCOUNTER — Telehealth (INDEPENDENT_AMBULATORY_CARE_PROVIDER_SITE_OTHER): Payer: Self-pay | Admitting: General Surgery

## 2011-10-14 ENCOUNTER — Other Ambulatory Visit: Payer: Self-pay | Admitting: *Deleted

## 2011-10-14 MED ORDER — RANITIDINE HCL 150 MG PO TABS: 150.0000 mg | ORAL_TABLET | Freq: Two times a day (BID) | ORAL | Status: DC

## 2011-10-14 NOTE — Telephone Encounter (Signed)
LM on AM to call the office to schedule a nurse visit per Dr [name] for 10/15/11. Need to check the area of excision and skin graft.

## 2011-10-15 ENCOUNTER — Ambulatory Visit (INDEPENDENT_AMBULATORY_CARE_PROVIDER_SITE_OTHER): Payer: Medicare Other

## 2011-10-15 DIAGNOSIS — Z9889 Other specified postprocedural states: Secondary | ICD-10-CM

## 2011-10-15 NOTE — Progress Notes (Signed)
Patient s/p excision of melanoma and skin graft, surgery was on 10/11/11. Changed the bandage on his left shin, area looks well, skin graft still approximated. Pt to call the clinic Monday morning to see if Dr [name] will need to see him during the week.

## 2011-10-19 ENCOUNTER — Ambulatory Visit (INDEPENDENT_AMBULATORY_CARE_PROVIDER_SITE_OTHER): Payer: Self-pay | Admitting: Cardiovascular Disease

## 2011-10-19 ENCOUNTER — Encounter (HOSPITAL_COMMUNITY): Payer: Self-pay | Admitting: Surgery

## 2011-10-19 DIAGNOSIS — R0989 Other specified symptoms and signs involving the circulatory and respiratory systems: Secondary | ICD-10-CM

## 2011-10-19 DIAGNOSIS — I639 Cerebral infarction, unspecified: Secondary | ICD-10-CM

## 2011-10-19 DIAGNOSIS — Z7901 Long term (current) use of anticoagulants: Secondary | ICD-10-CM

## 2011-10-19 LAB — POCT INR: INR: 1.6

## 2011-10-21 ENCOUNTER — Ambulatory Visit (INDEPENDENT_AMBULATORY_CARE_PROVIDER_SITE_OTHER): Payer: Medicare Other | Admitting: Surgery

## 2011-10-21 VITALS — BP 128/76 | HR 72 | Temp 96.4°F | Resp 16 | Ht 64.0 in | Wt 176.0 lb

## 2011-10-21 DIAGNOSIS — C439 Malignant melanoma of skin, unspecified: Secondary | ICD-10-CM

## 2011-10-21 NOTE — Progress Notes (Signed)
Mr. Brandenberger skin graft which was a full thickness graft over his left pretibial region looks good. His path report showed: Clark's level IV, Breslow 0.5 centimeter ulcerated melanoma. Margins are negative.  His skin graft appears to be healing nicely. There is good pink to the graft site in the middle. I'll see him back in about 5 days to readdress this. [facility] will actually dressed and next LC him in 3 weeks.

## 2011-10-25 ENCOUNTER — Encounter (INDEPENDENT_AMBULATORY_CARE_PROVIDER_SITE_OTHER): Payer: Medicare Other

## 2011-10-26 ENCOUNTER — Other Ambulatory Visit: Payer: Self-pay | Admitting: Internal Medicine

## 2011-10-26 ENCOUNTER — Ambulatory Visit (INDEPENDENT_AMBULATORY_CARE_PROVIDER_SITE_OTHER): Payer: Medicare Other | Admitting: Surgery

## 2011-10-26 DIAGNOSIS — Z4800 Encounter for change or removal of nonsurgical wound dressing: Secondary | ICD-10-CM

## 2011-10-26 NOTE — Progress Notes (Deleted)
Changed dressing this morning at wound site, graft is well approximated, pt will need to have daily dressing changes and needs to keep the area dry. Dressing changes to start tomorrow 10/27/11.  To change the dressing you will need to use a Telfa non adherent pad to place over the wound area. Secure the area with a wrap of conforming stretch guaze bandage and then place coban over the area to secure the bandages.

## 2011-10-26 NOTE — Progress Notes (Signed)
Changed dressing this morning at wound site, graft is well approximated.  Pt will need to have daily dressing changes and needs to keep the area dry. Dressing changes to start tomorrow 10/27/11.  To change the dressing you will need to use a Telfa non adherent pad to place over the wound area. Secure the area with a wrap of conforming stretch guaze bandage and then place coban over the area to secure the bandages.

## 2011-10-26 NOTE — Patient Instructions (Signed)
Pt will need to have daily dressing changes and needs to keep the area dry. Dressing changes to start tomorrow 10/27/11.  To change the dressing you will need to use a Telfa non adherent pad to place over the wound area. Secure the area with a wrap of conforming stretch guaze bandage and then place coban over the area to secure the bandages.

## 2011-11-02 ENCOUNTER — Ambulatory Visit (INDEPENDENT_AMBULATORY_CARE_PROVIDER_SITE_OTHER): Payer: Self-pay | Admitting: Cardiology

## 2011-11-02 DIAGNOSIS — Z7901 Long term (current) use of anticoagulants: Secondary | ICD-10-CM

## 2011-11-02 DIAGNOSIS — R0989 Other specified symptoms and signs involving the circulatory and respiratory systems: Secondary | ICD-10-CM

## 2011-11-02 DIAGNOSIS — I639 Cerebral infarction, unspecified: Secondary | ICD-10-CM

## 2011-11-02 LAB — POCT INR: INR: 2.3

## 2011-11-04 ENCOUNTER — Telehealth: Payer: Self-pay | Admitting: Internal Medicine

## 2011-11-04 MED ORDER — OMEPRAZOLE MAGNESIUM 20 MG PO TBEC: 20.0000 mg | DELAYED_RELEASE_TABLET | Freq: Every day | ORAL | Status: DC

## 2011-11-04 NOTE — Telephone Encounter (Signed)
Called and spoke with pt's son, [name].  [name] states pt recently had surgery done by Dr. [name] to remove melanoma from his shin.  Son states Dr. [name] d/c'd pt's Prilosec as pt was already on Ranitidine.  Son wanted to know if pt should restart Prilosec.  He states previously pt was taking both Prilosec qd and Ranitidine bid.  Son aware MW out of office until tomorrow.  MW, please advise.  Thanks  **FYI: Son asks that when we have a response back from MW, to call PT (instead of son) tomorrow as the son will be unavailable.  Also, son requests rx for Prilosec have a non-childproof lid and also no foil/push through tablets.

## 2011-11-04 NOTE — Telephone Encounter (Signed)
Called and spoke with pt's son, Rod.  Informed him of MW's recommendations.  Rod scheduled pt 6 week f/u appt with MW for 12/16/11 at 11:00.

## 2011-11-04 NOTE — Telephone Encounter (Signed)
Error.[name] ° °

## 2011-11-04 NOTE — Telephone Encounter (Signed)
For now take prilosec ac q am and ranitidine 150 qhs until regroup with ov in next 6 weeks or less if needed

## 2011-11-26 ENCOUNTER — Encounter (INDEPENDENT_AMBULATORY_CARE_PROVIDER_SITE_OTHER): Payer: Self-pay | Admitting: Surgery

## 2011-11-26 ENCOUNTER — Ambulatory Visit (INDEPENDENT_AMBULATORY_CARE_PROVIDER_SITE_OTHER): Payer: Medicare Other | Admitting: Surgery

## 2011-11-26 VITALS — BP 152/80 | HR 60 | Temp 97.6°F | Resp 18

## 2011-11-26 DIAGNOSIS — Z85820 Personal history of malignant melanoma of skin: Secondary | ICD-10-CM

## 2011-11-26 NOTE — Progress Notes (Signed)
He comes in today and the area on his leg is healing slowly. The epidermal areas turned to a scab and there is an area of granulation tissue can talk.. I've apply Neosporin to the area. I think it since it was down to the bone we want dermis and epidermis but mainly the dermis to cover this area. I think eventually had and will be able to let this heal in. I plan to see him again in February to assess progress. Condition doing well

## 2011-11-26 NOTE — Patient Instructions (Signed)
Treat the dark area of the grafted spot on your leg as a scab.  Don't pick it off but let it fall off. Apply neosporin ointment daily to the area See Dr. [name] in February

## 2011-11-29 ENCOUNTER — Ambulatory Visit (INDEPENDENT_AMBULATORY_CARE_PROVIDER_SITE_OTHER): Payer: Self-pay | Admitting: Cardiovascular Disease

## 2011-11-29 DIAGNOSIS — Z7901 Long term (current) use of anticoagulants: Secondary | ICD-10-CM

## 2011-11-29 DIAGNOSIS — R0989 Other specified symptoms and signs involving the circulatory and respiratory systems: Secondary | ICD-10-CM

## 2011-11-29 DIAGNOSIS — I639 Cerebral infarction, unspecified: Secondary | ICD-10-CM

## 2011-12-16 ENCOUNTER — Other Ambulatory Visit (INDEPENDENT_AMBULATORY_CARE_PROVIDER_SITE_OTHER): Payer: Medicare Other

## 2011-12-16 ENCOUNTER — Encounter: Payer: Self-pay | Admitting: Internal Medicine

## 2011-12-16 ENCOUNTER — Ambulatory Visit (INDEPENDENT_AMBULATORY_CARE_PROVIDER_SITE_OTHER): Payer: Medicare Other | Admitting: Internal Medicine

## 2011-12-16 DIAGNOSIS — I10 Essential (primary) hypertension: Secondary | ICD-10-CM

## 2011-12-16 DIAGNOSIS — J069 Acute upper respiratory infection, unspecified: Secondary | ICD-10-CM

## 2011-12-16 LAB — BASIC METABOLIC PANEL
Chloride: 102 mEq/L (ref 96–112)
GFR: 48.95 mL/min — ABNORMAL LOW (ref >60.00)
Potassium: 4.2 mEq/L (ref 3.5–5.1)
Sodium: 137 mEq/L (ref 135–145)

## 2011-12-16 LAB — CBC WITH DIFFERENTIAL/PLATELET
Basophils Relative: 0.1 % (ref 0.0–3.0)
Eosinophils Relative: 0.1 % (ref 0.0–5.0)
HCT: 35.3 % — ABNORMAL LOW (ref 39.0–52.0)
Hemoglobin: 11.9 g/dL — ABNORMAL LOW (ref 13.0–17.0)
Lymphs Abs: 1.1 10*3/uL (ref 0.7–4.0)
Monocytes Relative: 9.3 % (ref 3.0–12.0)
Neutro Abs: 4.7 10*3/uL (ref 1.4–7.7)
Platelets: 190 10*3/uL (ref 150.0–400.0)
RBC: 3.85 Mil/uL — ABNORMAL LOW (ref 4.22–5.81)
WBC: 6.4 10*3/uL (ref 4.5–10.5)

## 2011-12-16 MED ORDER — DOXYCYCLINE HYCLATE 100 MG PO TABS: 100.0000 mg | ORAL_TABLET | Freq: Two times a day (BID) | ORAL | Status: AC

## 2011-12-16 NOTE — Progress Notes (Signed)
Subjective:     Patient ID: [name], male   DOB: 1919/06/26, 78 y.o.   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 He has been maintained on Coumadin since then and mainly complains of intermittent dizziness dating all the way back to his stroke which is controlled on p.r.n. meclizine.  seen 04/19/08 for fu ok except ? recurrent melanoma left leg   August 26, 2008 cleared for melanoma surgery by Cards     December 02, 2010--Presents for a 4 week follow up. Last ov spironolactone was stopped due to gynecomastia. He was started on Lasix however he stopped this 2 weeks Prior to day of OV  "it makes him urinate too much". We discussed several options on diuretics and explained that the spironolactone can cause gynecomastia. Would also like a note for therapy for his right hand. Previous therapy in past has helped.>stoppped spironolactone/ rx lasix  09/20/2011 f/u ov/Vernadette Stutsman cc here for sugery clearance.  Bed > w/c but doing his own transfer due to right leg with problems with rehab R Femur fx May 2012.  rec Ok for leg surgery but need to stop the coumadin for 5 days before and you will need to be checked immediately before surgery to make sure you clot properly    12/16/2011 f/u ov/Chauncey Sciulli cc Pt c/o sore throat, congested cough, and runny nose x 3-4 days.  Discharge is watery so far and no ha/ myalgias or sob. Has chronic mild dysphagia but no obvious choking on food.   Sleeping ok without nocturnal  or early am exacerbation  of respiratory  C/o's .  Also denies any obvious fluctuation of symptoms with weather or environmental changes or other aggravating or alleviating factors except as outlined above   Pt denies any significant  itching, sneezing, , fever, chills, sweats, unintended wt loss, pleuritic or exertional cp, hempoptysis, change in activity tolerance orthopnea pnd or leg swelling Pt also denies any obvious fluctuation in  symptoms with weather or environmental change or other alleviating or aggravating factors.      Past Medical History:  BLINDNESS, ONE EYE (ICD-369.60)  CVA  SINUS BRADYCARDIA (ICD-427.81)  DIVERTICULOSIS, COLON (ICD-562.10)  Internal hemorrhoids  COUGH, CHRONIC (ICD-786.2)  PVD (ICD-443.9)  HYPERLIPIDEMIA (ICD-272.4)  HYPERTENSION (ICD-401.9)  - breast tenderness on aldactone August 25, 2010 > d/c October 29, 2010  Hyperkalemia on aldactone October 14, 2008 > d/c October 28, 2010 due to gynecomastia  Melanoma, skin graft 2003..............................................Marland KitchenJorja Loa [name] (surgery) - repeat skin graft 10/09  L Leg swelling  - Venous dopplers neg 01/18/11  COPD  OSA on CPAP  GERD  Health Maintenance ..................................................................Marland KitchenWert  - Td 03/2005  - Pneumovax 1998           Objective:   Physical Exam very frail amb wm walks with cane  193 July 22, 2010 > 193 August 25, 2010 >  >190 12/02/10>193 January 13, 2011 > 09/20/2011 > 12/16/2011  169  GEN: A/Ox3; pleasant , NAD  HEENT: [place]/AT, , EACs-clear, TMs-wnl, NOSE-clear, THROAT-clear, Left eye lower lid cleared up  NECK: Supple w/ fair ROM; no JVD; normal carotid impulses w/o brui.ts; no thyromegaly or nodules palpated; no lymphadenopathy.  RESP Clear to P & A; w/o, wheezes/ rales/ or rhonchi.  CARD: RRR, no m/r/g  GI: Soft & nt; nml bowel sounds; no organomegaly or masses detected.  Musco: Warm bil, no calf tenderness edema, clubbing, pulses intact,  along left  lower leg, previous scar from melanoma surgery- no longer has any redness along posteriior calf with scaly skin. venous insufficiency changes, stasis dermatic changes trace edema only     CXR  09/20/2011 :  No evidence of acute cardiopulmonary disease.  Stable cardiomegaly.   Assessment:         Plan:

## 2011-12-16 NOTE — Assessment & Plan Note (Signed)
Over Adequate control on present rx, reviewed need to stop lasix when sick to prevent dehydration

## 2011-12-16 NOTE — Patient Instructions (Addendum)
When sick ok to leave furosemide off completely  Doxycycline 100mg  twice daily before meals with water  Please remember to go to the lab  department downstairs for your tests - we will call you with the results when they are available.    Please schedule a follow up visit in 3 months but call sooner if needed

## 2011-12-16 NOTE — Assessment & Plan Note (Signed)
Explained natural history of uri and why it's necessary in patients at risk to treat GERD aggressively  at least  short term   to reduce risk of evolving cyclical cough initially  triggered by epithelial injury and a heightened sensitivty to the effects of any upper airway irritants,  most importantly acid - related.  That is, the more sensitive the epithelium damaged for virus, the more the cough, the more the secondary reflux (especially in those prone to reflux) the more the irritation of the sensitive mucosa and so on in a cyclical pattern.  For now rx with doxy empirically to cover atypical organisms (TWAR) and staph superinfection

## 2011-12-16 NOTE — Progress Notes (Signed)
Quick Note:  Spoke with pt and notified of results per Dr. Wert. Pt verbalized understanding and denied any questions.  ______ 

## 2011-12-21 ENCOUNTER — Ambulatory Visit (INDEPENDENT_AMBULATORY_CARE_PROVIDER_SITE_OTHER): Payer: Self-pay | Admitting: Internal Medicine

## 2011-12-21 DIAGNOSIS — Z7901 Long term (current) use of anticoagulants: Secondary | ICD-10-CM

## 2011-12-21 DIAGNOSIS — R0989 Other specified symptoms and signs involving the circulatory and respiratory systems: Secondary | ICD-10-CM

## 2011-12-21 DIAGNOSIS — I639 Cerebral infarction, unspecified: Secondary | ICD-10-CM

## 2011-12-21 LAB — POCT INR: INR: 5

## 2011-12-21 NOTE — Patient Instructions (Signed)
Anticoagulant safety with elevated INR

## 2011-12-28 ENCOUNTER — Ambulatory Visit (INDEPENDENT_AMBULATORY_CARE_PROVIDER_SITE_OTHER): Payer: Self-pay | Admitting: Cardiology

## 2011-12-28 DIAGNOSIS — I639 Cerebral infarction, unspecified: Secondary | ICD-10-CM

## 2011-12-28 DIAGNOSIS — Z7901 Long term (current) use of anticoagulants: Secondary | ICD-10-CM

## 2011-12-28 DIAGNOSIS — R0989 Other specified symptoms and signs involving the circulatory and respiratory systems: Secondary | ICD-10-CM

## 2012-01-04 ENCOUNTER — Other Ambulatory Visit: Payer: Self-pay | Admitting: Internal Medicine

## 2012-01-11 ENCOUNTER — Ambulatory Visit (INDEPENDENT_AMBULATORY_CARE_PROVIDER_SITE_OTHER): Payer: Self-pay | Admitting: Cardiology

## 2012-01-11 DIAGNOSIS — R0989 Other specified symptoms and signs involving the circulatory and respiratory systems: Secondary | ICD-10-CM

## 2012-01-11 DIAGNOSIS — Z7901 Long term (current) use of anticoagulants: Secondary | ICD-10-CM

## 2012-01-11 DIAGNOSIS — I639 Cerebral infarction, unspecified: Secondary | ICD-10-CM

## 2012-01-27 ENCOUNTER — Encounter (INDEPENDENT_AMBULATORY_CARE_PROVIDER_SITE_OTHER): Payer: Self-pay | Admitting: Surgery

## 2012-01-27 ENCOUNTER — Ambulatory Visit (INDEPENDENT_AMBULATORY_CARE_PROVIDER_SITE_OTHER): Payer: Medicare Other | Admitting: Surgery

## 2012-01-27 VITALS — BP 132/88 | HR 52 | Temp 97.8°F | Resp 16 | Ht 64.0 in | Wt 175.0 lb

## 2012-01-27 DIAGNOSIS — Z85820 Personal history of malignant melanoma of skin: Secondary | ICD-10-CM

## 2012-01-27 NOTE — Patient Instructions (Signed)
Continue daily dressing changes using Telfa and Coban and Koban wrap. See Dr. [name] in 3 months

## 2012-01-27 NOTE — Progress Notes (Signed)
Wound on leg is slowly healing but has good tissue over the bone.  Granulation looks good Continue wound dressings and return in 3 months

## 2012-02-01 ENCOUNTER — Ambulatory Visit: Payer: Self-pay | Admitting: Cardiovascular Disease

## 2012-02-01 DIAGNOSIS — Z7901 Long term (current) use of anticoagulants: Secondary | ICD-10-CM

## 2012-02-01 DIAGNOSIS — I639 Cerebral infarction, unspecified: Secondary | ICD-10-CM

## 2012-02-01 LAB — POCT INR: INR: 3.1

## 2012-02-15 ENCOUNTER — Ambulatory Visit: Payer: Self-pay | Admitting: Cardiology

## 2012-02-15 DIAGNOSIS — I639 Cerebral infarction, unspecified: Secondary | ICD-10-CM

## 2012-02-15 DIAGNOSIS — Z7901 Long term (current) use of anticoagulants: Secondary | ICD-10-CM

## 2012-02-15 LAB — POCT INR: INR: 2.9

## 2012-03-07 ENCOUNTER — Ambulatory Visit (INDEPENDENT_AMBULATORY_CARE_PROVIDER_SITE_OTHER): Payer: Medicare Other | Admitting: Cardiovascular Disease

## 2012-03-07 DIAGNOSIS — Z7901 Long term (current) use of anticoagulants: Secondary | ICD-10-CM

## 2012-03-07 DIAGNOSIS — I639 Cerebral infarction, unspecified: Secondary | ICD-10-CM

## 2012-03-07 DIAGNOSIS — I6350 Cerebral infarction due to unspecified occlusion or stenosis of unspecified cerebral artery: Secondary | ICD-10-CM

## 2012-03-07 LAB — POCT INR: INR: 3

## 2012-03-23 ENCOUNTER — Ambulatory Visit: Payer: Medicare Other | Admitting: Internal Medicine

## 2012-03-23 ENCOUNTER — Ambulatory Visit (INDEPENDENT_AMBULATORY_CARE_PROVIDER_SITE_OTHER): Payer: Medicare Other | Admitting: Internal Medicine

## 2012-03-23 ENCOUNTER — Other Ambulatory Visit (INDEPENDENT_AMBULATORY_CARE_PROVIDER_SITE_OTHER): Payer: Medicare Other

## 2012-03-23 ENCOUNTER — Encounter: Payer: Self-pay | Admitting: Internal Medicine

## 2012-03-23 VITALS — BP 132/82 | HR 53 | Temp 98.4°F | Ht 63.5 in | Wt 163.0 lb

## 2012-03-23 DIAGNOSIS — I10 Essential (primary) hypertension: Secondary | ICD-10-CM

## 2012-03-23 DIAGNOSIS — I639 Cerebral infarction, unspecified: Secondary | ICD-10-CM

## 2012-03-23 DIAGNOSIS — E785 Hyperlipidemia, unspecified: Secondary | ICD-10-CM

## 2012-03-23 DIAGNOSIS — I6350 Cerebral infarction due to unspecified occlusion or stenosis of unspecified cerebral artery: Secondary | ICD-10-CM

## 2012-03-23 LAB — BASIC METABOLIC PANEL
BUN: 29 mg/dL — ABNORMAL HIGH (ref 6–23)
GFR: 55.05 mL/min — ABNORMAL LOW (ref >60.00)
Potassium: 4.7 mEq/L (ref 3.5–5.1)

## 2012-03-23 NOTE — Patient Instructions (Signed)
Please remember to go to the lab   department downstairs for your tests - we will call you with the results when they are available.     Please schedule a follow up visit in 3 months but call sooner if needed  

## 2012-03-23 NOTE — Progress Notes (Signed)
Subjective:     Patient ID: [name], male   DOB: 04-25-1919    MRN: [id number]  HPI  88 yowm status post devastating stroke involving the right brain stem in 1999. He has been maintained on Coumadin since then and mainly complains of intermittent dizziness dating all the way back to his stroke which is controlled on p.r.n. meclizine.  seen 04/19/08 for fu ok except ? recurrent melanoma left leg   August 26, 2008 cleared for melanoma surgery by Cards     December 02, 2010--Presents for a 4 week follow up. Last ov spironolactone was stopped due to gynecomastia. He was started on Lasix however he stopped this 2 weeks Prior to day of OV  "it makes him urinate too much". We discussed several options on diuretics and explained that the spironolactone can cause gynecomastia. Would also like a note for therapy for his right hand. Previous therapy in past has helped.>stoppped spironolactone/ rx lasix  09/20/2011 f/u ov/Essa Wenk cc here for sugery clearance.  Bed > w/c but doing his own transfer due to right leg with problems with rehab R Femur fx May 2012.  rec Ok for leg surgery but need to stop the coumadin for 5 days before and you will need to be checked immediately before surgery to make sure you clot properly    03/23/2012 f/u ov/Calie Buttrey cc swelling better on present rx, no sob or cough. Swallowing ok, no change activity tolerance.   Sleeping ok without nocturnal  or early am exacerbation  of respiratory  C/o's .  Also denies any obvious fluctuation of symptoms with weather or environmental changes or other aggravating or alleviating factors except as outlined above   Pt denies any significant  itching, sneezing, , fever, chills, sweats, unintended wt loss, pleuritic or exertional cp, hempoptysis, change in activity tolerance orthopnea pnd or leg swelling Pt also denies any obvious fluctuation in symptoms with weather or environmental change or other alleviating or aggravating factors.       Past Medical History:  BLINDNESS, ONE EYE (ICD-369.60)  CVA  SINUS BRADYCARDIA (ICD-427.81)  DIVERTICULOSIS, COLON (ICD-562.10)  Internal hemorrhoids  COUGH, CHRONIC (ICD-786.2)  PVD (ICD-443.9)  HYPERLIPIDEMIA (ICD-272.4)  HYPERTENSION (ICD-401.9)  - breast tenderness on aldactone August 25, 2010 > d/c October 29, 2010  Hyperkalemia on aldactone October 14, 2008 > d/c October 28, 2010 due to gynecomastia  Melanoma, skin graft 2003..............................................Marland KitchenJorja Loa [name] (surgery) - repeat skin graft 10/09  L Leg swelling  - Venous dopplers neg 01/18/11  COPD  OSA on CPAP  GERD  Health Maintenance ..................................................................Marland KitchenWert  - Td 03/2005  - Pneumovax 1998           Objective:   Physical Exam very frail amb wm walks with cane  193 July 22, 2010 > 193 August 25, 2010 >  >190 12/02/10>193 January 13, 2011 > 09/20/2011 > 12/16/2011  169 > 03/23/2012  163 GEN: A/Ox3; pleasant , NAD  HEENT: [place]/AT, , EACs-clear, TMs-wnl, NOSE-clear, THROAT-clear, Left eye lower lid cleared up  NECK: Supple w/ fair ROM; no JVD; normal carotid impulses w/o brui.ts; no thyromegaly or nodules palpated; no lymphadenopathy.  RESP Clear to P & A; w/o, wheezes/ rales/ or rhonchi.  CARD: RRR, no m/r/g  GI: Soft & nt; nml bowel sounds; no organomegaly or masses detected.  Musco: Warm bil, no calf tenderness edema, clubbing, pulses intact,  along left lower leg, previous scar from melanoma surgery- no longer has any redness along posteriior calf with scaly  skin. venous insufficiency changes, stasis dermatic changes trace edema only     CXR  09/20/2011 :  -No evidence of acute cardiopulmonary disease.  Stable cardiomegaly.   Assessment:         Plan:

## 2012-03-23 NOTE — Assessment & Plan Note (Signed)
Adequate control on present rx, reviewed rx continue coumadin

## 2012-03-23 NOTE — Assessment & Plan Note (Signed)
Adequate control on present rx, reviewed  

## 2012-03-23 NOTE — Assessment & Plan Note (Signed)
No indication for further treatment

## 2012-03-28 ENCOUNTER — Ambulatory Visit: Payer: Self-pay | Admitting: Cardiovascular Disease

## 2012-03-28 DIAGNOSIS — Z7901 Long term (current) use of anticoagulants: Secondary | ICD-10-CM

## 2012-03-28 DIAGNOSIS — I639 Cerebral infarction, unspecified: Secondary | ICD-10-CM

## 2012-03-28 LAB — POCT INR: INR: 3

## 2012-04-05 IMAGING — CR DG PORTABLE PELVIS
1 series · 1 of 1 positions shown · non-contrast
Comparison: 05/06/2011.

CLINICAL DATA: Right total hip arthroplasty.

PORTABLE PELVIS

[AP]
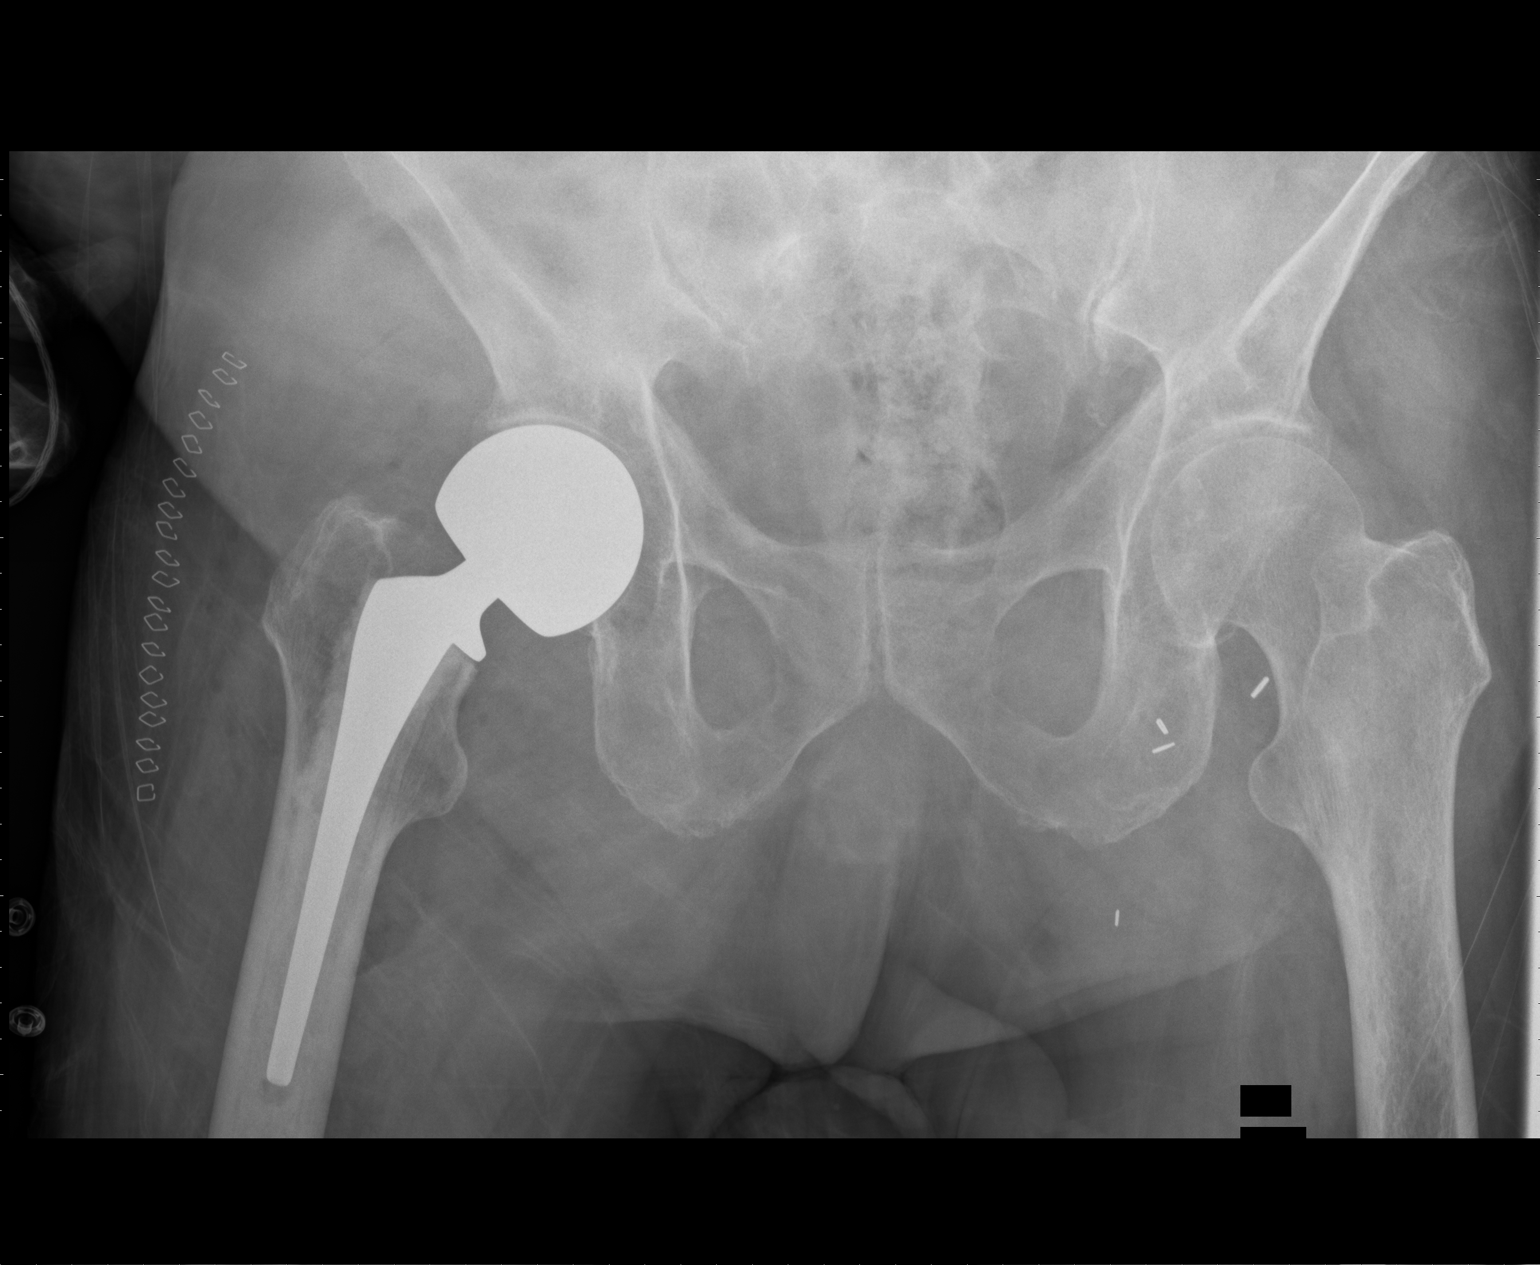

[1 of 1 positions shown; findings below may reference images not displayed]

FINDINGS: New right bipolar hip hemiarthroplasty without
complicating features.  No periprosthetic fracture.  Expected
postsurgical changes in the soft tissues around the right hip.

Distal stem visualized and normal.  Osteopenia.  The hip appears
located on the frontal view.
IMPRESSION: New uncomplicated right bipolar hip hemiarthroplasty.

## 2012-04-20 ENCOUNTER — Encounter (INDEPENDENT_AMBULATORY_CARE_PROVIDER_SITE_OTHER): Payer: Self-pay | Admitting: Surgery

## 2012-04-21 ENCOUNTER — Ambulatory Visit (INDEPENDENT_AMBULATORY_CARE_PROVIDER_SITE_OTHER): Payer: Medicare Other | Admitting: Surgery

## 2012-04-21 ENCOUNTER — Encounter (INDEPENDENT_AMBULATORY_CARE_PROVIDER_SITE_OTHER): Payer: Self-pay | Admitting: Surgery

## 2012-04-21 VITALS — BP 126/66 | HR 62 | Temp 98.2°F | Ht 63.5 in | Wt 162.0 lb

## 2012-04-21 DIAGNOSIS — Z85820 Personal history of malignant melanoma of skin: Secondary | ICD-10-CM

## 2012-04-21 NOTE — Progress Notes (Signed)
Chief Complaint:  Probable satellite lesion of the left shin  History of Present Illness:  [name] is an 78 y.o. male that has had several melanomas removed from his extremities and now adjacent one of his previous size has developed what appears to be a satellite lesion of the left shin. This is about 4 fingerbreadths up from his lateral malleolus. He had seen Dr. Hortense Ramal about this who recommended just going to  straight excisional biopsy.  He wants to proceed and he is familiar with this procedure.   Past Medical History  Diagnosis Date  . Cancer   . GERD (gastroesophageal reflux disease)   . Hypertension   . Stroke   . Hearing loss     wears hearing aid - right side  . Leg swelling   . Trouble swallowing   . Bruises easily     due to coumadin  . Weakness     difficulty walking  . Sleep apnea     uses cpap  . Contact lens/glasses fitting   . Heart disease     Past Surgical History  Procedure Date  . Excision of melanoma 2009    left leg  . Melanoma excision     many melanoma removed in past  . Joint replacement     r fem head fx  . Melanoma excision 10/11/2011    Procedure: MELANOMA EXCISION;  Surgeon: Valarie Merino, MD;  Location: [facility] OR;  Service: General;  Laterality: Left;  EXCISION melanoma left leg with full thickness skin grafting from left lower abdomen.    Current Outpatient Prescriptions  Medication Sig Dispense Refill  . furosemide (LASIX) 20 MG tablet Take 20 mg by mouth daily.        . mupirocin ointment (BACTROBAN) 2 % BID times 48H.      Marland Kitchen PRILOSEC OTC 20 MG tablet TAKE 1 TABLET DAILY BEFORE BREAKFAST . TAKE 30 MINUTES BEFORE BREAKFAST.  30 each  5  . ranitidine (ZANTAC) 150 MG tablet Take 150 mg by mouth at bedtime.      Marland Kitchen warfarin (COUMADIN) 6 MG tablet Take 3-6 mg by mouth daily. TAKE 3 MG TABLETS ON TUES, WED, THURS, SAT AND SUN, TAKES 6 MG ON Monday AND FRIDAY       Sulfonamide derivatives Family History  Problem Relation Age of Onset  .  Heart disease Father   . Cancer Son     prostate   Social History:   reports that he quit smoking about 47 years ago. His smoking use included Pipe. He has never used smokeless tobacco. He reports that he does not drink alcohol or use illicit drugs.   REVIEW OF SYSTEMS - PERTINENT POSITIVES ONLY: No change  Physical Exam:   Blood pressure 126/66, pulse 62, temperature 98.2 F (36.8 C), temperature source Temporal, height 5' 3.5" (1.613 m), weight 162 lb (73.483 kg), SpO2 96.00%. Body mass index is 28.25 kg/(m^2).  Gen:  WDWN WM NAD  Neurological: Alert and oriented to person, place, and time. Motor and sensory function is grossly intact  Head: Normocephalic and atraumatic.  Eyes: Conjunctivae are normal. Pupils are equal, round, and reactive to light. No scleral icterus.  Neck: Normal range of motion. Neck supple. No tracheal deviation or thyromegaly present.  Cardiovascular:  SR without murmurs or gallops.  No carotid bruits Respiratory: Effort normal.  No respiratory distress. No chest wall tenderness. Breath sounds normal.  No wheezes, rales or rhonchi.  Abdomen:  nontender GU: Musculoskeletal:  Normal range of motion. Extremities are nontender. No cyanosis, edema or clubbing noted Lymphadenopathy: No cervical, preauricular, postauricular or axillary adenopathy is present Skin: Vaguely darkly pigmented, raised lesion of the left pretibial area c/w melanoma . Pscyh: Normal mood and affect. Behavior is normal. Judgment and thought content normal.   LABORATORY RESULTS: No results found for this or any previous visit (from the past 48 hour(s)).  RADIOLOGY RESULTS: No results found.  Problem List: Patient Active Problem List  Diagnoses  . HYPERLIPIDEMIA  . BLINDNESS, ONE EYE  . HYPERTENSION  . SINUS BRADYCARDIA  . PVD  . GERD  . Diverticulosis of Colon (without Mention of Hemorrhage)  . GYNECOMASTIA  . KNEE PAIN  . EDEMA  . Personal history of malignant melanoma of skin    . CVA (cerebral vascular accident)  . Long term current use of anticoagulant  . URI (upper respiratory infection)    Assessment & Plan: Satellite melanoma lesion of left leg    Matt B. [name], MD, [facility] [phone] beeper [phone]  04/21/2012 2:28 PM

## 2012-04-25 ENCOUNTER — Ambulatory Visit: Payer: Self-pay | Admitting: Cardiovascular Disease

## 2012-04-25 DIAGNOSIS — Z7901 Long term (current) use of anticoagulants: Secondary | ICD-10-CM

## 2012-04-25 DIAGNOSIS — I639 Cerebral infarction, unspecified: Secondary | ICD-10-CM

## 2012-05-03 ENCOUNTER — Encounter (HOSPITAL_COMMUNITY): Payer: Self-pay | Admitting: Pharmacy Technician

## 2012-05-09 ENCOUNTER — Other Ambulatory Visit: Payer: Self-pay | Admitting: *Deleted

## 2012-05-09 MED ORDER — FUROSEMIDE 20 MG PO TABS: 20.0000 mg | ORAL_TABLET | Freq: Every day | ORAL | Status: DC

## 2012-05-12 ENCOUNTER — Encounter (HOSPITAL_COMMUNITY): Payer: Self-pay

## 2012-05-12 ENCOUNTER — Encounter (HOSPITAL_COMMUNITY)
Admission: RE | Admit: 2012-05-12 | Discharge: 2012-05-12 | Disposition: A | Discharge status: Home / Self Care | Payer: Medicare Other | Source: Ambulatory Visit | Attending: Surgery | Admitting: Surgery

## 2012-05-12 HISTORY — DX: Blindness, one eye, unspecified eye: H54.40

## 2012-05-12 LAB — CBC
MCHC: 31.6 g/dL (ref 30.0–36.0)
RDW: 13.4 % (ref 11.5–15.5)

## 2012-05-12 LAB — SURGICAL PCR SCREEN
MRSA, PCR: NEGATIVE
Staphylococcus aureus: NEGATIVE

## 2012-05-12 LAB — BASIC METABOLIC PANEL
GFR calc Af Amer: 54 mL/min — ABNORMAL LOW (ref >90)
GFR calc non Af Amer: 46 mL/min — ABNORMAL LOW (ref >90)
Potassium: 4.6 mEq/L (ref 3.5–5.1)
Sodium: 141 mEq/L (ref 135–145)

## 2012-05-12 LAB — PROTIME-INR: INR: 2.13 — ABNORMAL HIGH (ref 0.00–1.49)

## 2012-05-12 LAB — APTT: aPTT: 47 seconds — ABNORMAL HIGH (ref 24–37)

## 2012-05-12 NOTE — Patient Instructions (Signed)
22 [name]  05/12/2012   Your procedure is scheduled on:  05-16-2012  Report to [facility] at 1200  PM.  Call this number if you have problems the morning of surgery: [phone]   Remember:bring cpap mask only   Do not eat food :After Midnight.   clear liquids midnight until 0830 am, then nothing by mouth..prilosec, eye drop if needed  Take these medicines the morning of surgery with A SIP OF WATER:    Do not wear jewelry or make up.  Do not wear lotions, powders, or perfumes.Do not wear deodorant .    Do not bring valuables to the hospital.  Contacts, dentures or bridgework may not be worn into surgery.  Leave suitcase in the car. After surgery it may be brought to your room.  For patients admitted to the hospital, checkout time is 11:00 AM the day of discharge.     Special Instructions: CHG Shower Use Special Wash: 1/2 bottle night before surgery and 1/2 bottle morning of surgery, use regular soap on face and front and back private area.   Please read over the following fact sheets that you were given: MRSA Information  [name] pre op nurse phone number [phone], call if needed

## 2012-05-12 NOTE — Pre-Procedure Instructions (Signed)
Pt and son [name] reviewed pre op instructions using teach back method. Last office visit note/clearance note dr wert pulmonology 03-23-2012 epic Chest 2 view xray 09-20-2011 epic ekg 10-08-2011 epic

## 2012-05-15 ENCOUNTER — Other Ambulatory Visit (INDEPENDENT_AMBULATORY_CARE_PROVIDER_SITE_OTHER): Payer: Self-pay | Admitting: Surgery

## 2012-05-15 MED ORDER — CEFAZOLIN SODIUM 1-5 GM-% IV SOLN: 1.0000 g | INTRAVENOUS | Status: DC

## 2012-05-15 MED ORDER — CHLORHEXIDINE GLUCONATE 4 % EX LIQD
1.0000 "application " | Freq: Once | CUTANEOUS | Status: DC
  Filled 2012-05-15: qty 15

## 2012-05-15 MED ORDER — HEPARIN SODIUM (PORCINE) 5000 UNIT/ML IJ SOLN: 5000.0000 [IU] | Freq: Once | INTRAMUSCULAR | Status: DC

## 2012-05-15 NOTE — Pre-Procedure Instructions (Signed)
bmet results routed to dr [name] in box

## 2012-05-16 ENCOUNTER — Ambulatory Visit (HOSPITAL_COMMUNITY)
Admission: RE | Admit: 2012-05-16 | Discharge: 2012-05-17 | DRG: 578 | Disposition: A | Discharge status: Nursing Home (Includes ICF) | Payer: Medicare Other | Source: Ambulatory Visit | Attending: Surgery | Admitting: Surgery

## 2012-05-16 ENCOUNTER — Encounter (HOSPITAL_COMMUNITY)
Admission: RE | Disposition: A | Discharge status: Nursing Home (Includes ICF) | Payer: Self-pay | Source: Ambulatory Visit | Attending: Surgery

## 2012-05-16 ENCOUNTER — Encounter (HOSPITAL_COMMUNITY): Payer: Self-pay | Admitting: Certified Registered Nurse Anesthetist

## 2012-05-16 ENCOUNTER — Ambulatory Visit (HOSPITAL_COMMUNITY): Payer: Medicare Other | Admitting: Certified Registered Nurse Anesthetist

## 2012-05-16 ENCOUNTER — Encounter (HOSPITAL_COMMUNITY): Payer: Self-pay | Admitting: *Deleted

## 2012-05-16 DIAGNOSIS — I6990 Unspecified sequelae of unspecified cerebrovascular disease: Secondary | ICD-10-CM

## 2012-05-16 DIAGNOSIS — K5730 Diverticulosis of large intestine without perforation or abscess without bleeding: Secondary | ICD-10-CM | POA: Insufficient documentation

## 2012-05-16 DIAGNOSIS — I739 Peripheral vascular disease, unspecified: Secondary | ICD-10-CM | POA: Insufficient documentation

## 2012-05-16 DIAGNOSIS — I10 Essential (primary) hypertension: Secondary | ICD-10-CM | POA: Diagnosis present

## 2012-05-16 DIAGNOSIS — C4370 Malignant melanoma of unspecified lower limb, including hip: Secondary | ICD-10-CM | POA: Diagnosis present

## 2012-05-16 DIAGNOSIS — K219 Gastro-esophageal reflux disease without esophagitis: Secondary | ICD-10-CM | POA: Insufficient documentation

## 2012-05-16 DIAGNOSIS — G4730 Sleep apnea, unspecified: Secondary | ICD-10-CM | POA: Diagnosis present

## 2012-05-16 DIAGNOSIS — Z79899 Other long term (current) drug therapy: Secondary | ICD-10-CM | POA: Insufficient documentation

## 2012-05-16 DIAGNOSIS — Z01812 Encounter for preprocedural laboratory examination: Secondary | ICD-10-CM

## 2012-05-16 DIAGNOSIS — Z7901 Long term (current) use of anticoagulants: Secondary | ICD-10-CM | POA: Insufficient documentation

## 2012-05-16 DIAGNOSIS — E785 Hyperlipidemia, unspecified: Secondary | ICD-10-CM | POA: Insufficient documentation

## 2012-05-16 DIAGNOSIS — H5440 Blindness, one eye, unspecified eye: Secondary | ICD-10-CM | POA: Diagnosis present

## 2012-05-16 DIAGNOSIS — H9190 Unspecified hearing loss, unspecified ear: Secondary | ICD-10-CM | POA: Insufficient documentation

## 2012-05-16 HISTORY — PX: MELANOMA EXCISION: SHX5266

## 2012-05-16 LAB — PROTIME-INR
INR: 0.99 (ref 0.00–1.49)
Prothrombin Time: 13.3 seconds (ref 11.6–15.2)

## 2012-05-16 LAB — CBC
HCT: 35.8 % — ABNORMAL LOW (ref 39.0–52.0)
Hemoglobin: 11.5 g/dL — ABNORMAL LOW (ref 13.0–17.0)
MCH: 29.6 pg (ref 26.0–34.0)
MCV: 92.3 fL (ref 78.0–100.0)
RBC: 3.88 MIL/uL — ABNORMAL LOW (ref 4.22–5.81)

## 2012-05-16 SURGERY — EXCISION, MELANOMA
Anesthesia: Monitor Anesthesia Care | Site: Leg Lower | Laterality: Left | Wound class: Clean

## 2012-05-16 MED ORDER — LIDOCAINE HCL (CARDIAC) 20 MG/ML IV SOLN
INTRAVENOUS | Status: DC | PRN
  Administered 2012-05-16: 50 mg via INTRAVENOUS

## 2012-05-16 MED ORDER — FENTANYL CITRATE 0.05 MG/ML IJ SOLN: 25.0000 ug | INTRAMUSCULAR | Status: DC | PRN

## 2012-05-16 MED ORDER — CEFAZOLIN SODIUM 1-5 GM-% IV SOLN: 1.0000 g | INTRAVENOUS | Status: DC

## 2012-05-16 MED ORDER — FAMOTIDINE 10 MG PO TABS
10.0000 mg | ORAL_TABLET | Freq: Every day | ORAL | Status: DC
  Administered 2012-05-16: 10 mg via ORAL
  Filled 2012-05-16 (×2): qty 1

## 2012-05-16 MED ORDER — PHENYLEPHRINE HCL 10 MG/ML IJ SOLN
INTRAMUSCULAR | Status: DC | PRN
  Administered 2012-05-16: 10 ug via INTRAVENOUS
  Administered 2012-05-16 (×3): 20 ug via INTRAVENOUS

## 2012-05-16 MED ORDER — HEPARIN SODIUM (PORCINE) 5000 UNIT/ML IJ SOLN
INTRAMUSCULAR | Status: AC
  Administered 2012-05-16: 5000 [IU] via SUBCUTANEOUS
  Filled 2012-05-16: qty 1

## 2012-05-16 MED ORDER — TISSEEL VH 10 ML EX KIT
PACK | CUTANEOUS | Status: DC | PRN
  Administered 2012-05-16: 10 mL

## 2012-05-16 MED ORDER — LACTATED RINGERS IV SOLN
INTRAVENOUS | Status: DC | PRN
  Administered 2012-05-16 (×3): via INTRAVENOUS

## 2012-05-16 MED ORDER — FIBRIN SEALANT COMPONENT 5 ML EX KIT
PACK | CUTANEOUS | Status: AC
  Filled 2012-05-16: qty 2

## 2012-05-16 MED ORDER — MECLIZINE HCL 25 MG PO TABS
25.0000 mg | ORAL_TABLET | Freq: Three times a day (TID) | ORAL | Status: DC | PRN
  Filled 2012-05-16: qty 1

## 2012-05-16 MED ORDER — BUPIVACAINE HCL (PF) 0.25 % IJ SOLN
INTRAMUSCULAR | Status: AC
  Filled 2012-05-16: qty 30

## 2012-05-16 MED ORDER — KCL IN DEXTROSE-NACL 20-5-0.45 MEQ/L-%-% IV SOLN
INTRAVENOUS | Status: DC
  Administered 2012-05-16: 22:00:00 via INTRAVENOUS
  Filled 2012-05-16 (×2): qty 1000

## 2012-05-16 MED ORDER — FENTANYL CITRATE 0.05 MG/ML IJ SOLN
INTRAMUSCULAR | Status: DC | PRN
  Administered 2012-05-16 (×2): 50 ug via INTRAVENOUS

## 2012-05-16 MED ORDER — PROPOFOL 10 MG/ML IV EMUL
INTRAVENOUS | Status: DC | PRN
  Administered 2012-05-16: 175 mg via INTRAVENOUS

## 2012-05-16 MED ORDER — HEPARIN SODIUM (PORCINE) 5000 UNIT/ML IJ SOLN
5000.0000 [IU] | Freq: Once | INTRAMUSCULAR | Status: AC
  Administered 2012-05-16: 5000 [IU] via SUBCUTANEOUS

## 2012-05-16 MED ORDER — LACTATED RINGERS IV SOLN: INTRAVENOUS | Status: DC

## 2012-05-16 MED ORDER — BUPIVACAINE HCL 0.25 % IJ SOLN
INTRAMUSCULAR | Status: DC | PRN
  Administered 2012-05-16: 4 mL

## 2012-05-16 MED ORDER — HYDROCODONE-ACETAMINOPHEN 5-325 MG PO TABS: 1.0000 | ORAL_TABLET | ORAL | Status: DC | PRN

## 2012-05-16 MED ORDER — LACTATED RINGERS IV SOLN
INTRAVENOUS | Status: DC
  Administered 2012-05-16: 1000 mL via INTRAVENOUS

## 2012-05-16 MED ORDER — MORPHINE SULFATE 2 MG/ML IJ SOLN: 1.0000 mg | INTRAMUSCULAR | Status: DC | PRN

## 2012-05-16 MED ORDER — PANTOPRAZOLE SODIUM 40 MG PO TBEC
40.0000 mg | DELAYED_RELEASE_TABLET | Freq: Every day | ORAL | Status: DC
  Administered 2012-05-16: 40 mg via ORAL
  Filled 2012-05-16 (×2): qty 1

## 2012-05-16 MED ORDER — CEFAZOLIN SODIUM 1-5 GM-% IV SOLN
INTRAVENOUS | Status: AC
  Filled 2012-05-16: qty 50

## 2012-05-16 MED ORDER — BUPIVACAINE-EPINEPHRINE PF 0.25-1:200000 % IJ SOLN
INTRAMUSCULAR | Status: AC
  Filled 2012-05-16: qty 30

## 2012-05-16 MED ORDER — ACETAMINOPHEN 10 MG/ML IV SOLN
INTRAVENOUS | Status: DC | PRN
  Administered 2012-05-16: 1000 mg via INTRAVENOUS

## 2012-05-16 MED ORDER — CEFAZOLIN SODIUM 1-5 GM-% IV SOLN
INTRAVENOUS | Status: DC | PRN
  Administered 2012-05-16: 1 g via INTRAVENOUS

## 2012-05-16 MED ORDER — NAPHAZOLINE HCL 0.1 % OP SOLN
1.0000 [drp] | Freq: Four times a day (QID) | OPHTHALMIC | Status: DC | PRN
  Filled 2012-05-16: qty 15

## 2012-05-16 MED ORDER — FUROSEMIDE 20 MG PO TABS
20.0000 mg | ORAL_TABLET | Freq: Every day | ORAL | Status: DC
  Administered 2012-05-17: 20 mg via ORAL
  Filled 2012-05-16 (×2): qty 1

## 2012-05-16 MED ORDER — ACETAMINOPHEN 10 MG/ML IV SOLN
INTRAVENOUS | Status: AC
  Filled 2012-05-16: qty 100

## 2012-05-16 MED ORDER — ONDANSETRON HCL 4 MG/2ML IJ SOLN
INTRAMUSCULAR | Status: DC | PRN
  Administered 2012-05-16 (×4): 1 mg via INTRAVENOUS

## 2012-05-16 MED ORDER — HEPARIN SODIUM (PORCINE) 5000 UNIT/ML IJ SOLN
5000.0000 [IU] | Freq: Three times a day (TID) | INTRAMUSCULAR | Status: DC
  Administered 2012-05-16 – 2012-05-17 (×2): 5000 [IU] via SUBCUTANEOUS
  Filled 2012-05-16 (×5): qty 1

## 2012-05-16 MED ORDER — EPHEDRINE SULFATE 50 MG/ML IJ SOLN
INTRAMUSCULAR | Status: DC | PRN
  Administered 2012-05-16 (×2): 5 mg via INTRAVENOUS

## 2012-05-16 SURGICAL SUPPLY — 36 items
BANDAGE ELASTIC 4 VELCRO ST LF (GAUZE/BANDAGES/DRESSINGS) ×2 IMPLANT
BANDAGE GAUZE ELAST BULKY 4 IN (GAUZE/BANDAGES/DRESSINGS) ×2 IMPLANT
BENZOIN TINCTURE PRP APPL 2/3 (GAUZE/BANDAGES/DRESSINGS) IMPLANT
BLADE HEX COATED 2.75 (ELECTRODE) ×2 IMPLANT
BLADE SURG 15 STRL LF DISP TIS (BLADE) ×1 IMPLANT
BLADE SURG 15 STRL SS (BLADE) ×1
BLADE SURG SZ10 CARB STEEL (BLADE) ×2 IMPLANT
CANISTER SUCTION 2500CC (MISCELLANEOUS) ×2 IMPLANT
CLOTH BEACON ORANGE TIMEOUT ST (SAFETY) ×2 IMPLANT
DERMABOND ADVANCED (GAUZE/BANDAGES/DRESSINGS) ×1
DERMABOND ADVANCED .7 DNX12 (GAUZE/BANDAGES/DRESSINGS) ×1 IMPLANT
DRAPE EXTREMITY T 121X128X90 (DRAPE) ×2 IMPLANT
DRAPE LAPAROTOMY T 102X78X121 (DRAPES) IMPLANT
DRAPE LAPAROTOMY TRNSV 102X78 (DRAPE) IMPLANT
DRAPE LG THREE QUARTER DISP (DRAPES) IMPLANT
ELECT REM PT RETURN 9FT ADLT (ELECTROSURGICAL) ×2
ELECTRODE REM PT RTRN 9FT ADLT (ELECTROSURGICAL) ×1 IMPLANT
GLOVE BIOGEL M 8.0 STRL (GLOVE) ×2 IMPLANT
GLOVE BIOGEL PI IND STRL 7.0 (GLOVE) ×1 IMPLANT
GLOVE BIOGEL PI INDICATOR 7.0 (GLOVE) ×1
GOWN STRL NON-REIN LRG LVL3 (GOWN DISPOSABLE) ×2 IMPLANT
GOWN STRL REIN XL XLG (GOWN DISPOSABLE) ×4 IMPLANT
KIT BASIN OR (CUSTOM PROCEDURE TRAY) ×2 IMPLANT
NEEDLE HYPO 25X1 1.5 SAFETY (NEEDLE) ×2 IMPLANT
NS IRRIG 1000ML POUR BTL (IV SOLUTION) ×2 IMPLANT
PACK BASIC VI WITH GOWN DISP (CUSTOM PROCEDURE TRAY) ×2 IMPLANT
PAD TELFA 2X3 NADH STRL (GAUZE/BANDAGES/DRESSINGS) ×2 IMPLANT
PEN SKIN MARKING BROAD (MISCELLANEOUS) IMPLANT
PENCIL BUTTON HOLSTER BLD 10FT (ELECTRODE) ×2 IMPLANT
SPONGE GAUZE 4X4 12PLY (GAUZE/BANDAGES/DRESSINGS) ×2 IMPLANT
SPONGE LAP 18X18 X RAY DECT (DISPOSABLE) ×2 IMPLANT
SPONGE LAP 4X18 X RAY DECT (DISPOSABLE) ×2 IMPLANT
STAPLER VISISTAT 35W (STAPLE) IMPLANT
SUT VIC AB 4-0 SH 18 (SUTURE) ×4 IMPLANT
SYR CONTROL 10ML LL (SYRINGE) ×2 IMPLANT
YANKAUER SUCT BULB TIP 10FT TU (MISCELLANEOUS) ×2 IMPLANT

## 2012-05-16 NOTE — Transfer of Care (Signed)
Immediate Anesthesia Transfer of Care Note  Patient: [name]  Procedure(s) Performed: Procedure(s) (LRB): MELANOMA EXCISION (Left)  Patient Location: PACU  Anesthesia Type: General  Level of Consciousness: awake, patient cooperative, lethargic and responds to stimulation  Airway & Oxygen Therapy: Patient Spontanous Breathing and Patient connected to face mask oxygen  Post-op Assessment: Report given to PACU RN, Post -op Vital signs reviewed and stable and Patient moving all extremities  Post vital signs: Reviewed and stable  Complications: No apparent anesthesia complications

## 2012-05-16 NOTE — Anesthesia Preprocedure Evaluation (Addendum)
Anesthesia Evaluation  Patient identified by MRN, date of birth, ID band Patient awake  General Assessment Comment:Advanced yrs/increased CV risk HOH  Reviewed: Allergy & Precautions, H&P , NPO status , Patient's Chart, lab work & pertinent test results, reviewed documented beta blocker date and time   Airway Mallampati: II TM Distance: >3 FB Neck ROM: Full    Dental  (+) Teeth Intact and Dental Advisory Given   Pulmonary sleep apnea and Continuous Positive Airway Pressure Ventilation ,  breath sounds clear to auscultation        Cardiovascular hypertension, Rhythm:Regular Rate:Normal     Neuro/Psych CVA 1999, rt sided deficits Reduced vision left eye CVA, Residual Symptoms negative psych ROS   GI/Hepatic negative GI ROS, Neg liver ROS,   Endo/Other  negative endocrine ROS  Renal/GU negative Renal ROS  negative genitourinary   Musculoskeletal negative musculoskeletal ROS (+)   Abdominal   Peds negative pediatric ROS (+)  Hematology Anemia, Hbg 12.1   Anesthesia Other Findings   Reproductive/Obstetrics negative OB ROS                          Anesthesia Physical Anesthesia Plan  ASA: III  Anesthesia Plan: MAC and General   Post-op Pain Management:    Induction: Intravenous  Airway Management Planned: LMA  Additional Equipment:   Intra-op Plan:   Post-operative Plan: Extubation in OR  Informed Consent: I have reviewed the patients History and Physical, chart, labs and discussed the procedure including the risks, benefits and alternatives for the proposed anesthesia with the patient or authorized representative who has indicated his/her understanding and acceptance.   Dental advisory given  Plan Discussed with: CRNA and Surgeon  Anesthesia Plan Comments:        Anesthesia Quick Evaluation

## 2012-05-16 NOTE — Progress Notes (Signed)
Pt placed on auto-CPAP for the night with humidity and oxygen.  Pt is tolerating well at this time.  Pt encouraged to notify RN/ RT of any concerns.

## 2012-05-16 NOTE — H&P (Signed)
Chief Complaint:  Recurrent melanoma of the left leg  History of Present Illness:  [name] is an 78 y.o. male  that has had several melanomas removed from his extremities and now adjacent one of his previous size has developed what appears to be a satellite lesion of the left shin. This is about 4 fingerbreadths up from his lateral malleolus. He had seen Dr. Hortense Ramal about this who recommended just going to straight excisional biopsy. He wants to proceed and he is familiar with this procedure.  Plan excision with probable skin graft.    Past Medical History  Diagnosis Date  . GERD (gastroesophageal reflux disease)   . Hypertension   . Hearing loss     wears hearing aid - right side  . Leg swelling   . Trouble swallowing   . Bruises easily     due to coumadin  . Weakness     difficulty walking  . Contact lens/glasses fitting   . Heart disease   . Stroke 1999  . Sleep apnea     uses cpap, pt does not know setting  . Cancer   . 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 Excision of melanoma 2009    left leg  . Melanoma excision     many melanoma removed in past  . Melanoma excision 10/11/2011    Procedure: MELANOMA EXCISION;  Surgeon: Valarie Merino, MD;  Location: [facility] OR;  Service: General;  Laterality: Left;  EXCISION melanoma left leg with full thickness skin grafting from left lower abdomen.  . Joint replacement 2012    r fem head fx  . Lung benign area removed  1970's  . Hernia repair 1983    Current Facility-Administered Medications  Medication Dose Route Frequency Provider Last Rate Last Dose  . ceFAZolin (ANCEF) IVPB 1 g/50 mL premix  1 g Intravenous 60 min Pre-Op Valarie Merino, MD      . heparin injection 5,000 Units  5,000 Units Subcutaneous Once Valarie Merino, MD   5,000 Units at 05/16/12 1258  . lactated ringers infusion   Intravenous Continuous Jill Side, MD 125 mL/hr at 05/16/12 1423 1,000 mL at 05/16/12 1423    Sulfonamide derivatives Family History  Problem Relation Age of Onset  . Heart disease Father   . Cancer Son     prostate   Social History:   reports that he quit smoking about 47 years ago. His smoking use included Pipe. He has never used smokeless tobacco. He reports that he does not drink alcohol or use illicit drugs.   REVIEW OF SYSTEMS - PERTINENT POSITIVES ONLY: Multiple recurrent melanomas  Physical Exam:   Blood pressure 154/66, pulse 54, temperature 98.6 F (37 C), temperature source Oral, resp. rate 16, SpO2 99.00%. There is no height or weight on file to calculate BMI.  Gen:  WDWN WM NAD  Neurological: Alert and oriented to person, place, and time. Prior stroke and partial paresis  Head: Normocephalic and atraumatic.  Eyes: Conjunctivae are normal. Pupils are equal, round, and reactive to light. No scleral icterus.  Neck: Normal range of motion. Neck supple. No tracheal deviation or thyromegaly present.  Cardiovascular:  SR without murmurs or gallops.  No carotid bruits Respiratory: Effort normal.  No respiratory distress. No chest wall tenderness. Breath sounds normal.  No wheezes, rales or rhonchi.  Abdomen:  Soft, nontender GU: Musculoskeletal: multiple extremity scars from prior melanoma excisions Lymphadenopathy:  No cervical, preauricular, postauricular or axillary adenopathy is present Skin: Skin is warm and dry. No rash noted. No diaphoresis. No erythema. No pallor. Pscyh: Normal mood and affect. Behavior is normal. Judgment and thought content normal for a 78 year old man  LABORATORY RESULTS: Results for orders placed during the hospital encounter of 05/16/12 (from the past 48 hour(s))  PROTIME-INR     Status: Normal   Collection Time   05/16/12 12:35 PM      Component Value Range Comment   Prothrombin Time 13.3  11.6 - 15.2 (seconds)    INR 0.99  0.00 - 1.49      RADIOLOGY RESULTS: No results found.  Problem List: Patient Active Problem List   Diagnoses  . HYPERLIPIDEMIA  . BLINDNESS, ONE EYE  . HYPERTENSION  . SINUS BRADYCARDIA  . PVD  . GERD  . Diverticulosis of Colon (without Mention of Hemorrhage)  . GYNECOMASTIA  . KNEE PAIN  . EDEMA  . Personal history of malignant melanoma of skin  . CVA (cerebral vascular accident)  . Long term current use of anticoagulant  . URI (upper respiratory infection)    Assessment & Plan: Recurrent satellite nodule of left leg anterolaterally..   Excise and possible grafting.     Matt B. [name], MD, [facility] [phone] beeper [phone]  05/16/2012 3:05 PM

## 2012-05-16 NOTE — Op Note (Signed)
Surgeon: Wenda Low, MD, FACS  Asst:  None  Anes:  General by LMA  Procedure: Excision of melanoma left leg with full-thickness skin graft harvested from right lower quadrant.  Diagnosis: Recurrent melanoma of the left lower extremity   Complications: None  EBL:   5 cc  Description of Procedure:  Patient was taken to or 1 after the left lower extremity was marked at the site of the excision of melanoma. He was given general via LMA. Timeout was performed after he had been prepped with PCMX and draped sterilely. First I excised an elliptical skin graft from the right lower quadrant proximally 7 cm long by 2 cm.  Was placed in saline for subsequent use. The wound was closed with 4-0 Vicryl and Dermabond.  Next I exposed the lesion on the left leg. Marked an ellipse to get gross margins beyond this raised dark area area and unfortunately he's had multiple lesions on this leg and there is minimal excess skin and a new that going into this and would need to put a skin graft on this. Went ahead and excise this with a gross margin using electrocautery on the edge of the margin to destroy any disease at the margin as well as provide hemostasis. Medially beneath the lesion I took this directly off the fascia of the left lateral compartment. Once removed oh oriented and sent fresh for a melanoma study. Attempted approximate from either end with Vicryl sutures but there was too much tension. I then took the piece of skin that I had harvested and then fenestrated with an 11 blade, and defatted it and then sutured it to the perimeter with 4-0 Vicryl and also down to the fashion with 4-0 Vicryl. In addition I used Tisseel to help keep it held in place. The graft site was then closed with a Telfa and occlusive dressing. The patient will be admitted for postoperative monitoring.  Matt B. [name], MD, [facility], [place] [phone]

## 2012-05-16 NOTE — Anesthesia Postprocedure Evaluation (Signed)
  Anesthesia Post-op Note  Patient: [name]  Procedure(s) Performed: Procedure(s) (LRB): MELANOMA EXCISION (Left)  Patient Location: PACU  Anesthesia Type: General  Level of Consciousness: oriented and sedated  Airway and Oxygen Therapy: Patient Spontanous Breathing and Patient connected to nasal cannula oxygen  Post-op Pain: mild  Post-op Assessment: Post-op Vital signs reviewed, Patient's Cardiovascular Status Stable, Respiratory Function Stable and Patent Airway  Post-op Vital Signs: stable  Complications: No apparent anesthesia complications

## 2012-05-16 NOTE — Anesthesia Procedure Notes (Signed)
Procedure Name: LMA Insertion Date/Time: 05/16/2012 3:38 PM Performed by: Edison Pace Pre-anesthesia Checklist: Patient identified, Timeout performed, Emergency Drugs available, Suction available and Patient being monitored Patient Re-evaluated:Patient Re-evaluated prior to inductionOxygen Delivery Method: Circle system utilized Preoxygenation: Pre-oxygenation with 100% oxygen Intubation Type: IV induction LMA: LMA inserted LMA Size: 4.0 Number of attempts: 1

## 2012-05-16 NOTE — Progress Notes (Signed)
Pt received from PACU and settled into his room, VSS, no c/o pain, op sites are unremarkable at this point DSG C/D/I. Will continue to  monitor.

## 2012-05-16 NOTE — Preoperative (Signed)
Beta Blockers   Reason not to administer Beta Blockers:Not Applicable 

## 2012-05-17 ENCOUNTER — Encounter (HOSPITAL_COMMUNITY): Payer: Self-pay | Admitting: Surgery

## 2012-05-17 NOTE — Discharge Summary (Signed)
Physician Discharge Summary  Patient ID: YOAN SALLADE MRN: [id number] DOB/AGE: Aug 17, 1919 78 y.o.  Admit date: 05/16/2012 Discharge date: 05/17/2012  Admission Diagnoses:  Recurrent melanoma of left leg  Discharge Diagnoses:  same  Active Problems:  * No active hospital problems. *    Surgery:  Excision and full thickness skin graft to left leg  Discharged Condition: stable  Hospital Course:   Had surgery late in the evening and held overnight before discharge to [facility].    Consults: none  Significant Diagnostic Studies: none    Discharge Exam: Blood pressure 144/67, pulse 62, temperature 97.2 F (36.2 C), temperature source Axillary, resp. rate 18, height 5\' 4"  (1.626 m), weight 166 lb 7.2 oz (75.5 kg), SpO2 97.00%.  Dressing intact over grafted area.  Donor site clean and covered with Dermabond  Disposition: 03-Skilled Nursing Facility  Discharge Orders    Future Appointments: Provider: Department: Dept Phone: Center:   06/28/2012 8:45 AM Nyoka Cowden, MD [facility] [phone] None     Future Orders Please Complete By Expires   Diet - low sodium heart healthy      Increase activity slowly      Discharge instructions      Comments:   Leave dressing in place until seen at my office next Tuesday.   Leave dressing on - Keep it clean, dry, and intact until clinic visit        Medication List  As of 05/17/2012  9:22 AM   TAKE these medications         furosemide 20 MG tablet   Commonly known as: LASIX   Take 1 tablet (20 mg total) by mouth daily with breakfast.      meclizine 25 MG tablet   Commonly known as: ANTIVERT   Take 25 mg by mouth 3 (three) times daily as needed. For dizziness        omeprazole 20 MG capsule   Commonly known as: PRILOSEC   Take 20 mg by mouth daily with breakfast.      ranitidine 150 MG tablet   Commonly known as: ZANTAC   Take 150 mg by mouth every evening.      tetrahydrozoline 0.05 % ophthalmic solution   Place  1 drop into both eyes as needed. For dry eyes      warfarin 6 MG tablet   Commonly known as: COUMADIN   Take 3-6 mg by mouth every evening. TAKE 3 MG TABLETS ON TUES, WED, THURS, SAT AND SUN, TAKES 6 MG ON Monday AND FRIDAY           Follow-up Information    Follow up with Luretha Murphy B, MD in 6 days.   Contact information:   [employer] [address], [address] [place] [address] [phone]          Signed: Valarie Merino 05/17/2012, 9:22 AM

## 2012-05-17 NOTE — Care Management Note (Signed)
    Page 1 of 1   05/17/2012     1:36:55 PM   CARE MANAGEMENT NOTE 05/17/2012  Patient:  [name],[name]   Account Number:  [id number]  Date Initiated:  05/17/2012  Documentation initiated by:  Lorenda Ishihara  Subjective/Objective Assessment:   78 yo male admitted with exision of LE melenoma (recurrent) with skin graft. PTA lived at [facility]. PCP is Dr. Suzie Portela.     Action/Plan:   Anticipated DC Date:  05/17/2012   Anticipated DC Plan:  HOME/SELF CARE      DC Planning Services  CM consult      Choice offered to / List presented to:             Status of service:  Completed, signed off Medicare Important Message given?   (If response is "NO", the following Medicare IM given date fields will be blank) Date Medicare IM given:   Date Additional Medicare IM given:    Discharge Disposition:  HOME/SELF CARE  Per UR Regulation:  Reviewed for med. necessity/level of care/duration of stay  If discussed at Long Length of Stay Meetings, dates discussed:    Comments:  05-17-12 Lorenda Ishihara RN CM 1000 Spoke with patient at bedside. States his son will come to get him and take him home. Lives at [facility]. States he has a nurse see him daily, gets PT as needed by on site providers. Does not think he will need any further assistance at d/c. No orders noted for further HH, dressing to remain on until return office visit.

## 2012-05-17 NOTE — Progress Notes (Signed)
Discharge instructions reviewed with patient and son, questions and concerns answered, skin tear to right arm unsure of how patient had skin tear tegaderm in place Means, Myrtie Hawk RN 05-17-12 11:24am

## 2012-05-17 NOTE — Discharge Instructions (Signed)
Skin Grafting Skin-grafting is a procedure in which a patch of healthy skin is removed from one part of your body and moved to another part of your body which has no skin. It is done under an anesthetic. This means medications are used to make you pain free while healthy skin is removed. The place where skin is removed is called the donor site. The skin is removed by use of a dermatome. A dermatome is a skin-cutting instrument which removes a very thin layer of skin. This is called a split-thickness skin graft. The graft contains the epidermis (top skin layer) and a portion of the dermis. The dermis is the part of the skin which contains blood vessels, nerves, hair follicles, oil and sweat glands. The dermis left behind in the donor area is able to grow back new skin. The donor site will be tender until the new skin grows back. The tenderness comes from the nerves which have been uncovered with the skin removal. The donor site can be taken from any area of the body. It is usually an area that is hidden by clothes. Common sites are the buttocks or thighs. The site picked depends partly on the visibility of the donor skin and color match. The graft is carefully spread on the bare area to be covered. It is held in place either by gentle pressure from a padded dressing or by a few small stitches. The raw donor area is covered with a sterile nonstick dressing for 3-5 days. This provides comfort and protects the open skin from infection. For more extensive tissue loss, a full-thickness skin graft may be necessary. This includes the entire thickness of the skin. Common donor sites include flaps from the back or abdominal wall. Full-thickness grafts are used when a lot of tissue is lost. This sometimes happens with open fractures of the lower leg. Full-thickness graft uses both layers (epidermis and dermis) of the skin. Benefits are better contour, more natural color, and less reduction at the grafted site. Disadvantage  of full-thickness skin grafts is that the wound at the donor site is larger and requires more careful management. Sometimes a split-thickness graft must be used to cover the donor site. Other types of skin grafts include skin taken from a cadaver or from an animal. It is called an allograft when skin is taken from one person and grafted to another person. It is called a xenograft when skin is taken from one species (such as a pig) and transplanted to another species (a human). Disadvantages of these grafts are that the body may reject them. Rejection means that your body tries to get rid of the skin. Artificial skin products are available which can be used and the body will not reject these. INDICATIONS Skin grafts may be recommended for:  Large wounds.   Surgeries which require skin grafts for healing.   Any areas of skin loss such as burns.   Cosmetic reasons on reconstructive surgeries.   The advantages of rapid skin grafting are that they help prevent fluid loss in the case of burns and they provide a sterile covering which helps prevent infection.  RISKS AND COMPLICATIONS  Infection.   Loss of grafted skin.   Bleeding.   Reaction to anesthesia.   Getting an infectious disease if the graft is from someone else (an allograft).  PROGNOSIS  New blood vessels begin growing from the recipient area into the transplanted skin within 36 hours. Most skin grafts do well. However in some  cases if they do not heal well it can require repeat grafting. Scarring can occur in large wounds. If this is over a joint it can limit the movement of the joint. Poor result from grafting can result in poor cosmetic results. This means it may not look as good as you would like. RECOVERY  Recovery from grafting is usually quick following split thickness grafting. The skin graft must be protected from injury or stretching for 2-3 weeks. Depending on the location of the graft, a dressing may be necessary for 1-2  weeks or as directed. Avoid all exercises that could stretch or injure the graft for at least one month or as directed by your surgeon. Full-thickness grafts require a longer period of recovery. A hospital stay is most often necessary.   Grafted areas and donor sites should be protected from the sun with clothes or sunscreen. Wynelle Link exposure should be avoided until both areas are well healed.   Grafting sites are always a little more likely to get injured than your original equipment. Protect them always.  Document Released: 07/22/2005 Document Revised: 11/11/2011 Document Reviewed: 01/08/2008 [facility] Patient Information 2012 [place], [place].

## 2012-05-18 ENCOUNTER — Telehealth (INDEPENDENT_AMBULATORY_CARE_PROVIDER_SITE_OTHER): Payer: Self-pay

## 2012-05-18 NOTE — Telephone Encounter (Signed)
[name] called for the pt and wants to know if he can still do the exercise machine that is similar to an eliptical but sitting down, three times a day.  I spoke to Dr [name] and he gave the ok as long as he keeps it wrapped.  I informed [name]

## 2012-05-25 ENCOUNTER — Ambulatory Visit (INDEPENDENT_AMBULATORY_CARE_PROVIDER_SITE_OTHER): Payer: Medicare Other | Admitting: Surgery

## 2012-05-25 ENCOUNTER — Ambulatory Visit: Payer: Self-pay | Admitting: Cardiology

## 2012-05-25 DIAGNOSIS — Z85820 Personal history of malignant melanoma of skin: Secondary | ICD-10-CM

## 2012-05-25 DIAGNOSIS — Z7901 Long term (current) use of anticoagulants: Secondary | ICD-10-CM

## 2012-05-25 DIAGNOSIS — I639 Cerebral infarction, unspecified: Secondary | ICD-10-CM

## 2012-05-25 LAB — POCT INR: INR: 2

## 2012-05-25 NOTE — Patient Instructions (Signed)
Have nurses change daily using Telfa, Kerlex, followed by an Ace Wrap.  Thanks for your patience.  If you need further assistance after leaving the office, please call our office and speak with French Ana A.  [phone].  If you want to leave a message for Dr. [name], please call his office phone at [phone].

## 2012-05-25 NOTE — Progress Notes (Signed)
[name] comes in today and I cut down on his skin grafted area.  He had a 1.3 cm nodular melanoma that was close to but not involving the deep margin. The graft looked really good and paint. I will have him change the dressing daily with Telfa and an Ace wrap. I'll see him back in 3 weeks

## 2012-05-30 ENCOUNTER — Telehealth (INDEPENDENT_AMBULATORY_CARE_PROVIDER_SITE_OTHER): Payer: Self-pay | Admitting: General Surgery

## 2012-05-30 NOTE — Telephone Encounter (Signed)
Received a voicemail from [place] at [facility] stating Mr Sargeant is having a lot of drainage from the area of excision and it is green and slothy. The patient was not given instructions written for Triple antibiotic ointment. Expressed to Roseann that it was stressed several times to the patient and his son that it is to be used daily. They both understood the instructions. Offered an appt to bring the patient to the clinic this afternoon to be seen by the urgent clinic DR to check for infection. The appt was refused and I was told to call the patients son.

## 2012-05-30 NOTE — Telephone Encounter (Signed)
Left a message on the patients sons voice mail, [name] to call to schedule an appt urgently to look at [facility] excision

## 2012-06-02 ENCOUNTER — Encounter (INDEPENDENT_AMBULATORY_CARE_PROVIDER_SITE_OTHER): Payer: Self-pay | Admitting: Surgery

## 2012-06-02 ENCOUNTER — Ambulatory Visit (INDEPENDENT_AMBULATORY_CARE_PROVIDER_SITE_OTHER): Payer: Medicare Other | Admitting: Surgery

## 2012-06-02 VITALS — BP 134/66 | HR 60 | Temp 97.6°F | Ht 63.5 in | Wt 170.0 lb

## 2012-06-02 DIAGNOSIS — Z85820 Personal history of malignant melanoma of skin: Secondary | ICD-10-CM

## 2012-06-02 NOTE — Patient Instructions (Addendum)
Wrap leg less tightly

## 2012-06-02 NOTE — Progress Notes (Signed)
[name] 78 y.o.  Body mass index is 29.64 kg/(m^2).  Patient Active Problem List  Diagnosis  . HYPERLIPIDEMIA  . BLINDNESS, ONE EYE  . HYPERTENSION  . SINUS BRADYCARDIA  . PVD  . GERD  . Diverticulosis of Colon (without Mention of Hemorrhage)  . GYNECOMASTIA  . KNEE PAIN  . EDEMA  . Personal history of malignant melanoma of skin  . CVA (cerebral vascular accident)  . Long term current use of anticoagulant  . URI (upper respiratory infection)    Allergies  Allergen Reactions  . Sulfonamide Derivatives Rash    REACTION: rash    Past Surgical History  Procedure Date  . Excision of melanoma 2009    left leg  . Melanoma excision     many melanoma removed in past  . Melanoma excision 10/11/2011    Procedure: MELANOMA EXCISION;  Surgeon: Valarie Merino, MD;  Location: [facility] OR;  Service: General;  Laterality: Left;  EXCISION melanoma left leg with full thickness skin grafting from left lower abdomen.  . Joint replacement 2012    r fem head fx  . Lung benign area removed  1970's  . Hernia repair 1983  . Melanoma excision 05/16/2012    Procedure: MELANOMA EXCISION;  Surgeon: Valarie Merino, MD;  Location: [facility] ORS;  Service: General;  Laterality: Left;  Nodule Removal of Melanoma on Left Homero Fellers, MD No diagnosis found.  Leg examined. The skin graft site actually looks very good. I think your wrapping this little too tightly with the Ace wrap. As result he's got some skin excoriation away from the grafted area. I've encouraged him to wrap it more likely mainly to provide protection from injury and from bumping it. Today we will plus and Silvadene cream and redress. I will see him back in his previously scheduled appointment.  Matt B. [name], MD, [facility] [phone] beeper [phone]  06/02/2012 10:34 AM

## 2012-06-20 ENCOUNTER — Ambulatory Visit: Payer: Self-pay | Admitting: Cardiology

## 2012-06-20 DIAGNOSIS — I639 Cerebral infarction, unspecified: Secondary | ICD-10-CM

## 2012-06-20 DIAGNOSIS — Z7901 Long term (current) use of anticoagulants: Secondary | ICD-10-CM

## 2012-06-20 LAB — POCT INR: INR: 2.8

## 2012-06-27 ENCOUNTER — Ambulatory Visit (INDEPENDENT_AMBULATORY_CARE_PROVIDER_SITE_OTHER): Payer: Medicare Other | Admitting: Surgery

## 2012-06-27 DIAGNOSIS — Z85820 Personal history of malignant melanoma of skin: Secondary | ICD-10-CM

## 2012-06-27 NOTE — Progress Notes (Signed)
[name] 78 y.o.  There is no height or weight on file to calculate BMI.  Patient Active Problem List  Diagnosis  . HYPERLIPIDEMIA  . BLINDNESS, ONE EYE  . HYPERTENSION  . SINUS BRADYCARDIA  . PVD  . GERD  . Diverticulosis of Colon (without Mention of Hemorrhage)  . GYNECOMASTIA  . KNEE PAIN  . EDEMA  . Personal history of malignant melanoma of skin  . CVA (cerebral vascular accident)  . Long term current use of anticoagulant  . URI (upper respiratory infection)    Allergies  Allergen Reactions  . Sulfonamide Derivatives Rash    REACTION: rash    Past Surgical History  Procedure Date  . Excision of melanoma 2009    left leg  . Melanoma excision     many melanoma removed in past  . Melanoma excision 10/11/2011    Procedure: MELANOMA EXCISION;  Surgeon: Valarie Merino, MD;  Location: [facility] OR;  Service: General;  Laterality: Left;  EXCISION melanoma left leg with full thickness skin grafting from left lower abdomen.  . Joint replacement 2012    r fem head fx  . Lung benign area removed  1970's  . Hernia repair 1983  . Melanoma excision 05/16/2012    Procedure: MELANOMA EXCISION;  Surgeon: Valarie Merino, MD;  Location: [facility] ORS;  Service: General;  Laterality: Left;  Nodule Removal of Melanoma on Left Homero Fellers, MD No diagnosis found.  Graft site appears healthy.  The fluid weeping from his leg stopped about 1 week ago.  Will continue Neosporin and see back in 3 months. Matt B. [name], MD, [facility] [phone] beeper [phone]  06/27/2012 2:27 PM

## 2012-06-27 NOTE — Patient Instructions (Signed)
Continue dressings with Neosporin daily See Dr. [name] in 3 months

## 2012-06-28 ENCOUNTER — Ambulatory Visit (INDEPENDENT_AMBULATORY_CARE_PROVIDER_SITE_OTHER): Payer: Medicare Other | Admitting: Internal Medicine

## 2012-06-28 ENCOUNTER — Encounter: Payer: Self-pay | Admitting: Internal Medicine

## 2012-06-28 VITALS — BP 126/68 | HR 59 | Temp 98.3°F | Ht 63.5 in | Wt 174.0 lb

## 2012-06-28 DIAGNOSIS — I10 Essential (primary) hypertension: Secondary | ICD-10-CM

## 2012-06-28 DIAGNOSIS — I6350 Cerebral infarction due to unspecified occlusion or stenosis of unspecified cerebral artery: Secondary | ICD-10-CM

## 2012-06-28 DIAGNOSIS — I639 Cerebral infarction, unspecified: Secondary | ICD-10-CM

## 2012-06-28 NOTE — Patient Instructions (Signed)
Please see patient coordinator before you leave today  to schedule PT for R hand  Please schedule a follow up visit in 3 months but call sooner if needed

## 2012-06-28 NOTE — Progress Notes (Signed)
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 He has been maintained on Coumadin since then and mainly complains of intermittent dizziness dating all the way back to his stroke which is controlled on p.r.n. meclizine.  seen 04/19/08 for fu ok except ? recurrent melanoma left leg   August 26, 2008 cleared for melanoma surgery by Cards     December 02, 2010--Presents for a 4 week follow up. Last ov spironolactone was stopped due to gynecomastia. He was started on Lasix however he stopped this 2 weeks Prior to day of OV  "it makes him urinate too much". We discussed several options on diuretics and explained that the spironolactone can cause gynecomastia. Would also like a note for therapy for his right hand. Previous therapy in past has helped.>stoppped spironolactone/ rx lasix  09/20/2011 f/u ov/Bristol Soy cc here for sugery clearance.  Bed > w/c but doing his own transfer due to right leg with problems with rehab R Femur fx May 2012.  rec Ok for leg surgery but need to stop the coumadin for 5 days before and you will need to be checked immediately before surgery to make sure you clot properly    03/23/2012 f/u ov/Gwendalyn Mcgonagle cc swelling better on present rx, no sob or cough. Swallowing ok, no change activity tolerance. rec Please remember to go to the lab > bun 29/creat 1.3   06/28/2012 f/u ov/Tee Richeson cc R hand gradually getting weaker x 6 months, no longer getting any pt or neuro f/u but able to live in independent living so far. No other neuro complaints, speech and mentation not affected.  No ha or nausea   Sleeping ok without nocturnal  or early am exacerbation  of respiratory  C/o's .  Also denies any obvious fluctuation of symptoms with weather or environmental changes or other aggravating or alleviating factors except as outlined above   ROS  The following are not active  complaints unless bolded sore throat, dysphagia, dental problems, itching, sneezing,  nasal congestion or excess/ purulent secretions, ear ache,   fever, chills, sweats, unintended wt loss, pleuritic or exertional cp, hemoptysis,  orthopnea pnd or leg swelling, presyncope, palpitations, heartburn, abdominal pain, anorexia, nausea, vomiting, diarrhea  or change in bowel or urinary habits, change in stools or urine, dysuria,hematuria,  rash, arthralgias, visual complaints, headache, numbness weakness or ataxia or problems with walking or coordination,  change in mood/affect or memory.           Past Medical History:  BLINDNESS, ONE EYE (ICD-369.60)  CVA  SINUS BRADYCARDIA (ICD-427.81)  DIVERTICULOSIS, COLON (ICD-562.10)  Internal hemorrhoids  COUGH, CHRONIC (ICD-786.2)  PVD (ICD-443.9)  HYPERLIPIDEMIA (ICD-272.4)  HYPERTENSION (ICD-401.9)  - breast tenderness on aldactone August 25, 2010 > d/c October 29, 2010  Hyperkalemia on aldactone October 14, 2008 > d/c October 28, 2010 due to gynecomastia  Melanoma, skin graft 2003..............................................Marland KitchenJorja Loa [name] (surgery) - repeat skin graft 10/09  L Leg swelling  - Venous dopplers neg 01/18/11  COPD  OSA on CPAP  GERD  Health Maintenance ..................................................................Marland KitchenWert  - Td 03/2005  - Pneumovax 1998           Objective:   Physical Exam very frail wm c/w bound at this point 193 July 22, 2010 > 193 August 25, 2010 >  >190 12/02/10>193 January 13, 2011 > 09/20/2011 > 12/16/2011  169 > 03/23/2012  163 > 06/28/2012  174 GEN: A/Ox3; pleasant , NAD  HEENT: /AT, , EACs-clear, TMs-wnl, NOSE-clear, THROAT-clear   NECK: Supple w/ fair ROM; no JVD; normal carotid impulses w/o brui.ts; no thyromegaly or nodules palpated; no lymphadenopathy.  RESP Clear to P & A; w/o, wheezes/ rales/ or rhonchi.  CARD: RRR, no m/r/g  GI: Soft & nt; nml bowel sounds; no organomegaly or  masses detected.  Musco: Warm bil, no calf tenderness edema, clubbing, pulses intact Neuro alert, ? Depressed affect.  R hand contracted with minimal dorsiflexion, no other new findings       CXR  09/20/2011 :  -No evidence of acute cardiopulmonary disease.  Stable cardiomegaly.   Assessment:         Plan:

## 2012-06-28 NOTE — Assessment & Plan Note (Signed)
Adequate control on present rx, reviewed  

## 2012-06-28 NOTE — Assessment & Plan Note (Signed)
No evidence of new cva > probably has late contractures in right hand and becoming generally more debilitated.  Will try Home PT and if needed re-eval by neuro/ rehab service since last f/u was now years ago

## 2012-07-10 ENCOUNTER — Other Ambulatory Visit: Payer: Self-pay | Admitting: Internal Medicine

## 2012-07-11 ENCOUNTER — Ambulatory Visit: Payer: Self-pay | Admitting: Cardiovascular Disease

## 2012-07-11 DIAGNOSIS — Z7901 Long term (current) use of anticoagulants: Secondary | ICD-10-CM

## 2012-07-11 DIAGNOSIS — I639 Cerebral infarction, unspecified: Secondary | ICD-10-CM

## 2012-07-14 ENCOUNTER — Telehealth: Payer: Self-pay | Admitting: Pharmacist

## 2012-07-14 NOTE — Telephone Encounter (Signed)
Message copied by Velda Shell on Fri Jul 14, 2012  8:45 AM ------      Message from: Nyoka Cowden      Created: Tue Jul 11, 2012 12:18 PM       Fine with me, thx!            ----- Message -----         From: Mariane Masters, PHARMD         Sent: 07/11/2012  11:59 AM           To: Nyoka Cowden, MD            Pt having epidural injection on 8/16 by Dr. Ethelene Hal.  He has typically held x 5 days prior to injection with no Lovenox bridge.  Okay to proceed the same way again?            Thanks!

## 2012-08-01 ENCOUNTER — Ambulatory Visit: Payer: Self-pay | Admitting: Cardiology

## 2012-08-01 DIAGNOSIS — Z7901 Long term (current) use of anticoagulants: Secondary | ICD-10-CM

## 2012-08-01 DIAGNOSIS — I639 Cerebral infarction, unspecified: Secondary | ICD-10-CM

## 2012-08-03 ENCOUNTER — Other Ambulatory Visit: Payer: Self-pay | Admitting: Internal Medicine

## 2012-08-10 ENCOUNTER — Telehealth: Payer: Self-pay | Admitting: Internal Medicine

## 2012-08-10 DIAGNOSIS — H9190 Unspecified hearing loss, unspecified ear: Secondary | ICD-10-CM

## 2012-08-10 NOTE — Telephone Encounter (Signed)
Ok with me dx hearing loss

## 2012-08-10 NOTE — Telephone Encounter (Signed)
Please advise if ok to send referral to [facility]. This is needed in order for medicare to cover visit. [name], CMA

## 2012-08-10 NOTE — Telephone Encounter (Signed)
Referral sent pt son is aware.[name], CMA

## 2012-08-21 ENCOUNTER — Ambulatory Visit: Payer: Self-pay | Admitting: Cardiology

## 2012-08-21 DIAGNOSIS — I639 Cerebral infarction, unspecified: Secondary | ICD-10-CM

## 2012-08-21 DIAGNOSIS — Z7901 Long term (current) use of anticoagulants: Secondary | ICD-10-CM

## 2012-08-21 LAB — POCT INR: INR: 3

## 2012-09-11 ENCOUNTER — Ambulatory Visit: Payer: Self-pay | Admitting: Cardiology

## 2012-09-11 ENCOUNTER — Other Ambulatory Visit: Payer: Self-pay | Admitting: Internal Medicine

## 2012-09-11 DIAGNOSIS — I639 Cerebral infarction, unspecified: Secondary | ICD-10-CM

## 2012-09-11 DIAGNOSIS — Z7901 Long term (current) use of anticoagulants: Secondary | ICD-10-CM

## 2012-09-11 LAB — POCT INR: INR: 4.1

## 2012-09-18 ENCOUNTER — Ambulatory Visit: Payer: Self-pay | Admitting: Cardiovascular Disease

## 2012-09-18 DIAGNOSIS — I639 Cerebral infarction, unspecified: Secondary | ICD-10-CM

## 2012-09-18 DIAGNOSIS — Z7901 Long term (current) use of anticoagulants: Secondary | ICD-10-CM

## 2012-09-18 LAB — POCT INR: INR: 3.4

## 2012-09-25 ENCOUNTER — Ambulatory Visit (INDEPENDENT_AMBULATORY_CARE_PROVIDER_SITE_OTHER): Payer: Medicare Other | Admitting: Internal Medicine

## 2012-09-25 ENCOUNTER — Encounter: Payer: Self-pay | Admitting: Internal Medicine

## 2012-09-25 VITALS — BP 122/82 | HR 55 | Temp 99.0°F | Ht 64.0 in | Wt 166.0 lb

## 2012-09-25 DIAGNOSIS — I639 Cerebral infarction, unspecified: Secondary | ICD-10-CM

## 2012-09-25 DIAGNOSIS — I6350 Cerebral infarction due to unspecified occlusion or stenosis of unspecified cerebral artery: Secondary | ICD-10-CM

## 2012-09-25 DIAGNOSIS — I10 Essential (primary) hypertension: Secondary | ICD-10-CM

## 2012-09-25 NOTE — Progress Notes (Signed)
Subjective:     Patient ID: [name], male   DOB: Jun 22, 1919    MRN: [id number]  HPI  37 yowm status post devastating stroke involving the right brain stem in 1999. He has been maintained on Coumadin since then and mainly complains of intermittent dizziness dating all the way back to his stroke which is controlled on p.r.n. meclizine.  seen 04/19/08 for fu ok except ? recurrent melanoma left leg   August 26, 2008 cleared for melanoma surgery by Cards     December 02, 2010--Presents for a 4 week follow up. Last ov spironolactone was stopped due to gynecomastia. He was started on Lasix however he stopped this 2 weeks Prior to day of OV  "it makes him urinate too much". We discussed several options on diuretics and explained that the spironolactone can cause gynecomastia. Would also like a note for therapy for his right hand. Previous therapy in past has helped.>stoppped spironolactone/ rx lasix  09/20/2011 f/u ov/Wert cc here for sugery clearance.  Bed > w/c but doing his own transfer due to right leg with problems with rehab R Femur fx May 2012.  rec Ok for leg surgery but need to stop the coumadin for 5 days before and you will need to be checked immediately before surgery to make sure you clot properly    03/23/2012 f/u ov/Wert cc swelling better on present rx, no sob or cough. Swallowing ok, no change activity tolerance. rec Please remember to go to the lab > bun 29/creat 1.3   06/28/2012 f/u ov/Wert cc R hand gradually getting weaker x 6 months, no longer getting any pt or neuro f/u but able to live in independent living so far. No other neuro complaints, speech and mentation not affected.  No ha or nausea. rec Please see patient coordinator before you leave today  to schedule PT for R hand   09/25/2012 f/u ov/Wert cc multiple concerns re chronic  R hand weak, not happy with PT eval at NH, son thinks needs new w/c with more features given his level of dysfunction.  Has not  returned to neuro or rehab for so long he doesn't remember the doctors he saw re these issues..   Sleeping ok without nocturnal  or early am exacerbation  of respiratory  C/o's .  Also denies any obvious fluctuation of symptoms with weather or environmental changes or other aggravating or alleviating factors except as outlined above   ROS  The following are not active complaints unless bolded sore throat, dysphagia, dental problems, itching, sneezing,  nasal congestion or excess/ purulent secretions, ear ache,   fever, chills, sweats, unintended wt loss, pleuritic or exertional cp, hemoptysis,  orthopnea pnd or leg swelling, presyncope, palpitations, heartburn, abdominal pain, anorexia, nausea, vomiting, diarrhea  or change in bowel or urinary habits, change in stools or urine, dysuria,hematuria,  rash, arthralgias, visual complaints, headache, numbness weakness or ataxia or problems with walking or coordination,  change in mood/affect or memory.           Past Medical History:  BLINDNESS, ONE EYE (ICD-369.60)  CVA  SINUS BRADYCARDIA (ICD-427.81)  DIVERTICULOSIS, COLON (ICD-562.10)  Internal hemorrhoids  COUGH, CHRONIC (ICD-786.2)  PVD (ICD-443.9)  HYPERLIPIDEMIA (ICD-272.4)  HYPERTENSION (ICD-401.9)  - breast tenderness on aldactone August 25, 2010 > d/c October 29, 2010  Hyperkalemia on aldactone October 14, 2008 > d/c October 28, 2010 due to gynecomastia  Melanoma, skin graft 2003..............................................Marland KitchenJorja Loa [name] (surgery) - repeat skin graft 10/09  L Leg  swelling  - Venous dopplers neg 01/18/11  COPD  OSA on CPAP  GERD  Health Maintenance ..................................................................Marland KitchenWert  - Td 03/2005  - Pneumovax 1998           Objective:   Physical Exam very frail wm c/w bound at this point. hopleless affect 193 July 22, 2010 > 193 August 25, 2010 >  >190 12/02/10>193 January 13, 2011 > 09/20/2011 > 12/16/2011   169 > 03/23/2012  163 > 06/28/2012  174 > 09/25/2012  166 GEN: A/Ox3; pleasant , NAD  HEENT: [place]/AT, , EACs-clear, TMs-wnl, NOSE-clear, THROAT-clear   NECK: Supple w/ fair ROM; no JVD; normal carotid impulses w/o brui.ts; no thyromegaly or nodules palpated; no lymphadenopathy.  RESP Clear to P & A; w/o, wheezes/ rales/ or rhonchi.  CARD: RRR, no m/r/g  GI: Soft & nt; nml bowel sounds; no organomegaly or masses detected.  Musco: Warm bil, no calf tenderness edema, clubbing, pulses intact Neuro alert, ? Depressed affect.  R hand contracted with minimal dorsiflexion, no  new findings       CXR  09/20/2011 :  -No evidence of acute cardiopulmonary disease.  Stable cardiomegaly.   Assessment:         Plan:

## 2012-09-25 NOTE — Patient Instructions (Addendum)
Change furosemide to every other day dosing  We will call for neurology referral   Please schedule a follow up visit in 3 months but call sooner if needed

## 2012-09-27 NOTE — Assessment & Plan Note (Signed)
Adequate control on present rx, reviewed  

## 2012-09-27 NOTE — Assessment & Plan Note (Signed)
No evidence of any new findings - believe this is more of generalized geriatric decline but pt wants second opinion so refer back to neuro and outpt rehab

## 2012-09-29 ENCOUNTER — Telehealth: Payer: Self-pay | Admitting: Internal Medicine

## 2012-09-29 ENCOUNTER — Ambulatory Visit: Payer: Medicare Other | Admitting: Physical Therapy

## 2012-09-29 DIAGNOSIS — I639 Cerebral infarction, unspecified: Secondary | ICD-10-CM

## 2012-09-29 NOTE — Telephone Encounter (Signed)
Called and spoke with Teryl Lucy at [facility].  Patient has appt today at 1025am.  Needing clarification on order. Referral comment states:  I believe he has seen Willis remotely, c/o worse R hand weakness s/p cva (same hand)  Office note states : Will call for neurology referral.  Is patient to be having Physical Therapy or just to be seeing a neurologist or both?  If patient is needing PT an order stating this is needed.  Dr. [name] please advise. Thank You

## 2012-09-29 NOTE — Telephone Encounter (Signed)
I'm not sure what he needs or whether he even qualifies for PT/OT as just tried to get this done in his NH with no benefit perceived by pt but I would like him to get all the care that's appropriate and would prefer neurology take the ball and run with it in terms of directing his pt/ot

## 2012-09-29 NOTE — Telephone Encounter (Signed)
Called and spoke with patient and informed her of Dr. Rolin Barry recs. Appears patient is to see neurologist and let neurology decide if patient needs PT/OT Maxine verbalized understanding of this, nothing further needed.  [name], MD 09/29/2012 10:20 AM Signed  I'm not sure what he needs or whether he even qualifies for PT/OT as just tried to get this done in his NH with no benefit perceived by pt but I would like him to get all the care that's appropriate and would prefer neurology take the ball and run with it in terms of directing his pt/ot

## 2012-09-29 NOTE — Telephone Encounter (Signed)
Called spoke with Rod, will replace the order for Dr Anne Hahn w/ neurology.  Also Rod is wondering MW thinks a wheelchair w/ some "adaptive features" would be beneficial for pt.  Rod is aware Neuro may be the best option to discuss this with and is okay with a call back Monday.

## 2012-09-29 NOTE — Telephone Encounter (Signed)
Error.  Duplicate message.  Holly D Pryor °- °

## 2012-09-29 NOTE — Telephone Encounter (Signed)
Spoke with pt's son and notified of recs per MW He verbalized understanding and states nothing further needed

## 2012-09-29 NOTE — Telephone Encounter (Signed)
(  I noticed that pt's appt w/ [facility] today was canceled.  On the canceled reason, it stated that I told the clinic that the pt needed to see a neurologist prior scheduling PT/OT.  I did NOT say that, I took a message & sent it to triage to advise neurology regarding pt's appt.  The son then called & asked for me, I am putting in another note regarding the pt's neurology referral, PT & OT.) [name]

## 2012-09-29 NOTE — Telephone Encounter (Signed)
Let neuro decide what all he needs for his physical limitations

## 2012-10-02 ENCOUNTER — Ambulatory Visit: Payer: Self-pay | Admitting: Internal Medicine

## 2012-10-02 DIAGNOSIS — Z7901 Long term (current) use of anticoagulants: Secondary | ICD-10-CM

## 2012-10-02 DIAGNOSIS — I639 Cerebral infarction, unspecified: Secondary | ICD-10-CM

## 2012-10-02 LAB — POCT INR: INR: 2.5

## 2012-10-16 ENCOUNTER — Ambulatory Visit: Payer: Self-pay | Admitting: Internal Medicine

## 2012-10-16 DIAGNOSIS — I639 Cerebral infarction, unspecified: Secondary | ICD-10-CM

## 2012-10-16 DIAGNOSIS — Z7901 Long term (current) use of anticoagulants: Secondary | ICD-10-CM

## 2012-10-25 ENCOUNTER — Encounter (INDEPENDENT_AMBULATORY_CARE_PROVIDER_SITE_OTHER): Payer: Self-pay | Admitting: Surgery

## 2012-10-25 ENCOUNTER — Other Ambulatory Visit: Payer: Self-pay | Admitting: Internal Medicine

## 2012-10-25 ENCOUNTER — Ambulatory Visit (INDEPENDENT_AMBULATORY_CARE_PROVIDER_SITE_OTHER): Payer: Medicare Other | Admitting: Surgery

## 2012-10-25 VITALS — BP 138/88 | HR 52 | Temp 97.3°F | Resp 20 | Ht 63.5 in | Wt 175.0 lb

## 2012-10-25 DIAGNOSIS — Z85820 Personal history of malignant melanoma of skin: Secondary | ICD-10-CM

## 2012-10-25 NOTE — Patient Instructions (Signed)
Thanks for your patience.  If you need further assistance after leaving the office, please call our office and speak with a [facility] nurse.  [phone].  If you want to leave a message for Dr. Dayra Rapley, please call his office phone at [phone]. 

## 2012-10-25 NOTE — Progress Notes (Signed)
[name] 78 y.o.  Body mass index is 30.51 kg/(m^2).  Patient Active Problem List  Diagnosis  . HYPERLIPIDEMIA  . BLINDNESS, ONE EYE  . HYPERTENSION  . SINUS BRADYCARDIA  . PVD  . GERD  . Diverticulosis of Colon (without Mention of Hemorrhage)  . GYNECOMASTIA  . KNEE PAIN  . EDEMA  . Personal history of malignant melanoma of skin  . CVA (cerebral vascular accident)  . Long term current use of anticoagulant    Allergies  Allergen Reactions  . Sulfonamide Derivatives Rash    REACTION: rash    Past Surgical History  Procedure Date  . Excision of melanoma 2009    left leg  . Melanoma excision     many melanoma removed in past  . Melanoma excision 10/11/2011    Procedure: MELANOMA EXCISION;  Surgeon: Valarie Merino, MD;  Location: [facility] OR;  Service: General;  Laterality: Left;  EXCISION melanoma left leg with full thickness skin grafting from left lower abdomen.  . Joint replacement 2012    r fem head fx  . Lung benign area removed  1970's  . Hernia repair 1983  . Melanoma excision 05/16/2012    Procedure: MELANOMA EXCISION;  Surgeon: Valarie Merino, MD;  Location: [facility] ORS;  Service: General;  Laterality: Left;  Nodule Removal of Melanoma on Left [name] Fellers, MD No diagnosis found.  Very thin shin skin on the left.  He is keeping it dressed which protects it from abrasions.  No evidence of recurrent melanoma now but that is only a matter of time.   Will see back prn.   [name] B. [name], MD, [facility] [phone] beeper [phone]  10/25/2012 10:04 AM

## 2012-10-30 ENCOUNTER — Other Ambulatory Visit: Payer: Self-pay | Admitting: Neurology

## 2012-10-30 DIAGNOSIS — I679 Cerebrovascular disease, unspecified: Secondary | ICD-10-CM

## 2012-10-30 DIAGNOSIS — R269 Unspecified abnormalities of gait and mobility: Secondary | ICD-10-CM

## 2012-11-04 ENCOUNTER — Other Ambulatory Visit: Payer: Self-pay | Admitting: Internal Medicine

## 2012-11-06 ENCOUNTER — Ambulatory Visit
Admission: RE | Admit: 2012-11-06 | Discharge: 2012-11-06 | Disposition: A | Discharge status: Home / Self Care | Payer: Medicare Other | Source: Ambulatory Visit | Attending: Neurology | Admitting: Neurology

## 2012-11-06 ENCOUNTER — Ambulatory Visit: Payer: Self-pay | Admitting: Cardiovascular Disease

## 2012-11-06 DIAGNOSIS — I639 Cerebral infarction, unspecified: Secondary | ICD-10-CM

## 2012-11-06 DIAGNOSIS — I679 Cerebrovascular disease, unspecified: Secondary | ICD-10-CM

## 2012-11-06 DIAGNOSIS — Z7901 Long term (current) use of anticoagulants: Secondary | ICD-10-CM

## 2012-11-06 DIAGNOSIS — R269 Unspecified abnormalities of gait and mobility: Secondary | ICD-10-CM

## 2012-11-10 ENCOUNTER — Telehealth: Payer: Self-pay | Admitting: Internal Medicine

## 2012-11-10 NOTE — Telephone Encounter (Signed)
I have spoke with [facility], patient has Rx for ranitidine already there.  Called son back to make him aware of this and nothing further needed at this time.

## 2012-11-20 ENCOUNTER — Ambulatory Visit: Payer: Self-pay | Admitting: Internal Medicine

## 2012-11-20 ENCOUNTER — Ambulatory Visit: Payer: Medicare Other | Admitting: Physical Therapy

## 2012-11-20 ENCOUNTER — Ambulatory Visit: Payer: Medicare Other | Attending: Neurology | Admitting: Occupational Therapy

## 2012-11-20 DIAGNOSIS — M2560 Stiffness of unspecified joint, not elsewhere classified: Secondary | ICD-10-CM | POA: Insufficient documentation

## 2012-11-20 DIAGNOSIS — M2420 Disorder of ligament, unspecified site: Secondary | ICD-10-CM | POA: Insufficient documentation

## 2012-11-20 DIAGNOSIS — M629 Disorder of muscle, unspecified: Secondary | ICD-10-CM | POA: Insufficient documentation

## 2012-11-20 DIAGNOSIS — Z5189 Encounter for other specified aftercare: Secondary | ICD-10-CM | POA: Insufficient documentation

## 2012-11-20 DIAGNOSIS — I639 Cerebral infarction, unspecified: Secondary | ICD-10-CM

## 2012-11-20 DIAGNOSIS — I69998 Other sequelae following unspecified cerebrovascular disease: Secondary | ICD-10-CM | POA: Insufficient documentation

## 2012-11-20 DIAGNOSIS — R269 Unspecified abnormalities of gait and mobility: Secondary | ICD-10-CM | POA: Insufficient documentation

## 2012-11-20 DIAGNOSIS — Z7901 Long term (current) use of anticoagulants: Secondary | ICD-10-CM

## 2012-11-20 DIAGNOSIS — M6281 Muscle weakness (generalized): Secondary | ICD-10-CM | POA: Insufficient documentation

## 2012-11-20 LAB — POCT INR: INR: 3.4

## 2012-11-22 ENCOUNTER — Ambulatory Visit: Payer: Medicare Other | Admitting: Physical Therapy

## 2012-11-27 ENCOUNTER — Ambulatory Visit: Payer: Medicare Other | Admitting: Physical Therapy

## 2012-12-04 ENCOUNTER — Ambulatory Visit: Payer: Medicare Other | Admitting: Physical Therapy

## 2012-12-04 ENCOUNTER — Ambulatory Visit: Payer: Medicare Other | Admitting: Occupational Therapy

## 2012-12-05 ENCOUNTER — Ambulatory Visit: Payer: Self-pay | Admitting: Cardiology

## 2012-12-05 DIAGNOSIS — Z7901 Long term (current) use of anticoagulants: Secondary | ICD-10-CM

## 2012-12-05 DIAGNOSIS — I639 Cerebral infarction, unspecified: Secondary | ICD-10-CM

## 2012-12-07 ENCOUNTER — Ambulatory Visit: Payer: Medicare Other | Attending: Internal Medicine | Admitting: Physical Therapy

## 2012-12-07 DIAGNOSIS — M6281 Muscle weakness (generalized): Secondary | ICD-10-CM | POA: Insufficient documentation

## 2012-12-07 DIAGNOSIS — R269 Unspecified abnormalities of gait and mobility: Secondary | ICD-10-CM | POA: Insufficient documentation

## 2012-12-07 DIAGNOSIS — Z5189 Encounter for other specified aftercare: Secondary | ICD-10-CM | POA: Insufficient documentation

## 2012-12-11 ENCOUNTER — Ambulatory Visit: Payer: Medicare Other | Admitting: Occupational Therapy

## 2012-12-11 ENCOUNTER — Ambulatory Visit: Payer: Medicare Other | Admitting: Physical Therapy

## 2012-12-12 ENCOUNTER — Other Ambulatory Visit: Payer: Self-pay | Admitting: Internal Medicine

## 2012-12-12 MED ORDER — OMEPRAZOLE 20 MG PO CPDR: 20.0000 mg | DELAYED_RELEASE_CAPSULE | Freq: Every day | ORAL | Status: DC

## 2012-12-12 NOTE — Telephone Encounter (Signed)
Pt's insurance will no longer cover Prilosec OTC and the pharmacy is requesting a prescription for regular Prilosec 20mg  once a day. New RX has been sent.

## 2012-12-13 ENCOUNTER — Ambulatory Visit: Payer: Medicare Other | Admitting: Occupational Therapy

## 2012-12-13 ENCOUNTER — Ambulatory Visit: Payer: Medicare Other | Admitting: Physical Therapy

## 2012-12-13 ENCOUNTER — Other Ambulatory Visit: Payer: Self-pay | Admitting: Internal Medicine

## 2012-12-13 MED ORDER — OMEPRAZOLE MAGNESIUM 20 MG PO TBEC: 20.0000 mg | DELAYED_RELEASE_TABLET | Freq: Every day | ORAL | Status: DC

## 2012-12-13 NOTE — Telephone Encounter (Signed)
otc prilosec is no longer covered by his insurance per pharmacy .  Called in rx for prilosec . Noting further needed.

## 2012-12-18 ENCOUNTER — Ambulatory Visit: Payer: Self-pay | Admitting: Cardiology

## 2012-12-18 DIAGNOSIS — Z7901 Long term (current) use of anticoagulants: Secondary | ICD-10-CM

## 2012-12-18 DIAGNOSIS — I639 Cerebral infarction, unspecified: Secondary | ICD-10-CM

## 2012-12-19 ENCOUNTER — Ambulatory Visit: Payer: Medicare Other | Admitting: Physical Therapy

## 2012-12-19 ENCOUNTER — Ambulatory Visit: Payer: Medicare Other | Admitting: Occupational Therapy

## 2012-12-21 ENCOUNTER — Ambulatory Visit: Payer: Medicare Other | Admitting: Physical Therapy

## 2012-12-21 ENCOUNTER — Ambulatory Visit: Payer: Medicare Other | Admitting: Occupational Therapy

## 2012-12-25 ENCOUNTER — Encounter: Payer: Self-pay | Admitting: Internal Medicine

## 2012-12-25 ENCOUNTER — Other Ambulatory Visit (INDEPENDENT_AMBULATORY_CARE_PROVIDER_SITE_OTHER): Payer: Medicare Other

## 2012-12-25 ENCOUNTER — Ambulatory Visit (INDEPENDENT_AMBULATORY_CARE_PROVIDER_SITE_OTHER): Payer: Medicare Other | Admitting: Internal Medicine

## 2012-12-25 VITALS — BP 122/70 | HR 60 | Temp 97.6°F | Ht 63.5 in | Wt 166.0 lb

## 2012-12-25 DIAGNOSIS — R609 Edema, unspecified: Secondary | ICD-10-CM

## 2012-12-25 DIAGNOSIS — I10 Essential (primary) hypertension: Secondary | ICD-10-CM

## 2012-12-25 DIAGNOSIS — I6350 Cerebral infarction due to unspecified occlusion or stenosis of unspecified cerebral artery: Secondary | ICD-10-CM

## 2012-12-25 DIAGNOSIS — I639 Cerebral infarction, unspecified: Secondary | ICD-10-CM

## 2012-12-25 LAB — BASIC METABOLIC PANEL
CO2: 29 mEq/L (ref 19–32)
Glucose, Bld: 91 mg/dL (ref 70–99)
Potassium: 4.1 mEq/L (ref 3.5–5.1)
Sodium: 136 mEq/L (ref 135–145)

## 2012-12-25 NOTE — Patient Instructions (Addendum)
Please remember to go to the lab  department downstairs for your tests - we will call you with the results when they are available.  If swelling gets worse wear your compression hose.  Please schedule a follow up visit in 3 months but call sooner if needed

## 2012-12-25 NOTE — Progress Notes (Signed)
Subjective:     Patient ID: [name], male   DOB: 08-25-1919    MRN: [id number]  HPI  83 yowm status post devastating stroke involving the right brain stem in 1999. He has been maintained on Coumadin since then and mainly complains of intermittent dizziness dating all the way back to his stroke which is controlled on p.r.n. meclizine.  seen 04/19/08 for fu ok except ? recurrent melanoma left leg   August 26, 2008 cleared for melanoma surgery by Cards     December 02, 2010--Presents for a 4 week follow up. Last ov spironolactone was stopped due to gynecomastia. He was started on Lasix however he stopped this 2 weeks Prior to day of OV  "it makes him urinate too much". We discussed several options on diuretics and explained that the spironolactone can cause gynecomastia. Would also like a note for therapy for his right hand. Previous therapy in past has helped.>stoppped spironolactone/ rx lasix  09/20/2011 f/u ov/[name] cc here for sugery clearance.  Bed > w/c but doing his own transfer due to right leg with problems with rehab R Femur fx May 2012.  rec Ok for leg surgery but need to stop the coumadin for 5 days before and you will need to be checked immediately before surgery to make sure you clot properly    03/23/2012 f/u ov/[name] cc swelling better on present rx, no sob or cough. Swallowing ok, no change activity tolerance. rec Please remember to go to the lab > bun 29/creat 1.3   06/28/2012 f/u ov/[name] cc R hand gradually getting weaker x 6 months, no longer getting any pt or neuro f/u but able to live in independent living so far. No other neuro complaints, speech and mentation not affected.  No ha or nausea. rec Please see patient coordinator before you leave today  to schedule PT for R hand   09/25/2012 f/u ov/[name] cc multiple concerns re chronic  R hand weak, not happy with PT eval at NH, son thinks needs new w/c with more features given his level of dysfunction.  Has not  returned to neuro or rehab for so long he doesn't remember the doctors he saw re these issues..  rec Change furosemide to every other day dosing   12/25/2012 f/u ov/[name] cc  Mobility improving, f/u by Anne Hahn, PT, OT.  varaibly swelling L Leg, better when elevated with no sob or cough.  Sleeping ok without nocturnal  or early am exacerbation  of respiratory  C/o's .  Also denies any obvious fluctuation of symptoms with weather or environmental changes or other aggravating or alleviating factors except as outlined above   ROS  The following are not active complaints unless bolded sore throat, dysphagia, dental problems, itching, sneezing,  nasal congestion or excess/ purulent secretions, ear ache,   fever, chills, sweats, unintended wt loss, pleuritic or exertional cp, hemoptysis,  orthopnea pnd or leg swelling, presyncope, palpitations, heartburn, abdominal pain, anorexia, nausea, vomiting, diarrhea  or change in bowel or urinary habits, change in stools or urine, dysuria,hematuria,  rash, arthralgias, visual complaints, headache, numbness weakness or ataxia or problems with walking or coordination,  change in mood/affect or memory.           Past Medical History:  BLINDNESS, ONE EYE (ICD-369.60)  CVA  SINUS BRADYCARDIA (ICD-427.81)  DIVERTICULOSIS, COLON (ICD-562.10)  Internal hemorrhoids  COUGH, CHRONIC (ICD-786.2)  PVD (ICD-443.9)  HYPERLIPIDEMIA (ICD-272.4)  HYPERTENSION (ICD-401.9)  - breast tenderness on aldactone August 25, 2010 >  d/c October 29, 2010  Hyperkalemia on aldactone October 14, 2008 > d/c October 28, 2010 due to gynecomastia  Melanoma, skin graft 2003..............................................Marland KitchenJorja Loa [name] (surgery) - repeat skin graft 10/09  L Leg swelling  - Venous dopplers neg 01/18/11  COPD  OSA on CPAP  GERD  Health Maintenance ..................................................................Marland KitchenWert  - Td 03/2005  - Pneumovax 1998             Objective:   Physical Exam very frail wm c/w bound at this point. hopleless affect 193 July 22, 2010 > 193 August 25, 2010 >  >190 12/02/10>193 January 13, 2011 > 09/20/2011 > 12/16/2011  169 > 03/23/2012  163 > 06/28/2012  174 > 09/25/2012  166 > 166 12/25/2012  GEN: A/Ox3; pleasant , NAD  HEENT: [place]/AT, , EACs-clear, TMs-wnl, NOSE-clear, THROAT-clear   NECK: Supple w/ fair ROM; no JVD; normal carotid impulses w/o brui.ts; no thyromegaly or nodules palpated; no lymphadenopathy.  RESP Clear to P & A; w/o, wheezes/ rales/ or rhonchi.  CARD: RRR, no m/r/g  GI: Soft & nt; nml bowel sounds; no organomegaly or masses detected.  Musco: Warm bil, no calf tenderness edema, clubbing, pulses intact Neuro alert, ? Depressed affect.  R hand contracted with minimal dorsiflexion, no  new findings       CXR  09/20/2011 :  -No evidence of acute cardiopulmonary disease.  Stable cardiomegaly.   Assessment:         Plan:

## 2012-12-26 ENCOUNTER — Telehealth: Payer: Self-pay | Admitting: Internal Medicine

## 2012-12-26 ENCOUNTER — Ambulatory Visit: Payer: Medicare Other | Admitting: Occupational Therapy

## 2012-12-26 ENCOUNTER — Ambulatory Visit: Payer: Medicare Other | Admitting: Physical Therapy

## 2012-12-26 NOTE — Telephone Encounter (Signed)
Notes Recorded by Nyoka Cowden, MD on 12/26/2012 at 6:28 AM Call patient : Study is unremarkable, no change in recs --  Left detailed message on named VM for [name] advising of results. Advised to call if had any questions. Will sign off message

## 2012-12-28 ENCOUNTER — Ambulatory Visit: Payer: Medicare Other | Admitting: Physical Therapy

## 2012-12-28 ENCOUNTER — Ambulatory Visit: Payer: Medicare Other | Admitting: Occupational Therapy

## 2012-12-31 NOTE — Assessment & Plan Note (Signed)
Variable and related to previous surgery with neg venous dopplers and no change symptoms since then  [facility] to add compression hose prn

## 2012-12-31 NOTE — Assessment & Plan Note (Signed)
Improving with pt/ot directed by Dr Anne Hahn

## 2012-12-31 NOTE — Assessment & Plan Note (Signed)
Adequate control on present rx, reviewed renal function ok too

## 2013-01-01 ENCOUNTER — Encounter: Payer: Self-pay | Admitting: Pharmacist

## 2013-01-01 ENCOUNTER — Ambulatory Visit: Payer: Self-pay | Admitting: Internal Medicine

## 2013-01-01 ENCOUNTER — Ambulatory Visit: Payer: Medicare Other | Admitting: Physical Therapy

## 2013-01-01 ENCOUNTER — Ambulatory Visit: Payer: Medicare Other | Admitting: Occupational Therapy

## 2013-01-01 DIAGNOSIS — I639 Cerebral infarction, unspecified: Secondary | ICD-10-CM

## 2013-01-01 DIAGNOSIS — Z7901 Long term (current) use of anticoagulants: Secondary | ICD-10-CM

## 2013-01-01 NOTE — Telephone Encounter (Signed)
This encounter was created in error - please disregard.

## 2013-01-03 ENCOUNTER — Ambulatory Visit: Payer: Medicare Other | Admitting: Physical Therapy

## 2013-01-03 ENCOUNTER — Ambulatory Visit: Payer: Medicare Other | Admitting: Occupational Therapy

## 2013-01-05 ENCOUNTER — Ambulatory Visit: Payer: Medicare Other | Admitting: Physical Therapy

## 2013-01-08 ENCOUNTER — Ambulatory Visit: Payer: Medicare Other | Admitting: Occupational Therapy

## 2013-01-08 ENCOUNTER — Ambulatory Visit: Payer: Medicare Other | Attending: Neurology | Admitting: Physical Therapy

## 2013-01-08 DIAGNOSIS — Z5189 Encounter for other specified aftercare: Secondary | ICD-10-CM | POA: Insufficient documentation

## 2013-01-08 DIAGNOSIS — M629 Disorder of muscle, unspecified: Secondary | ICD-10-CM | POA: Insufficient documentation

## 2013-01-08 DIAGNOSIS — M6281 Muscle weakness (generalized): Secondary | ICD-10-CM | POA: Insufficient documentation

## 2013-01-08 DIAGNOSIS — M2420 Disorder of ligament, unspecified site: Secondary | ICD-10-CM | POA: Insufficient documentation

## 2013-01-08 DIAGNOSIS — I69998 Other sequelae following unspecified cerebrovascular disease: Secondary | ICD-10-CM | POA: Insufficient documentation

## 2013-01-08 DIAGNOSIS — M2560 Stiffness of unspecified joint, not elsewhere classified: Secondary | ICD-10-CM | POA: Insufficient documentation

## 2013-01-08 DIAGNOSIS — R269 Unspecified abnormalities of gait and mobility: Secondary | ICD-10-CM | POA: Insufficient documentation

## 2013-01-10 ENCOUNTER — Ambulatory Visit: Payer: Medicare Other | Admitting: Physical Therapy

## 2013-01-10 ENCOUNTER — Ambulatory Visit: Payer: Medicare Other | Admitting: Occupational Therapy

## 2013-01-11 ENCOUNTER — Ambulatory Visit: Payer: Medicare Other | Admitting: Physical Therapy

## 2013-01-15 ENCOUNTER — Ambulatory Visit: Payer: Medicare Other | Admitting: Physical Therapy

## 2013-01-15 ENCOUNTER — Ambulatory Visit: Payer: Self-pay | Admitting: Cardiology

## 2013-01-15 ENCOUNTER — Other Ambulatory Visit: Payer: Self-pay | Admitting: *Deleted

## 2013-01-15 ENCOUNTER — Encounter: Payer: Medicare Other | Admitting: Occupational Therapy

## 2013-01-15 DIAGNOSIS — Z7901 Long term (current) use of anticoagulants: Secondary | ICD-10-CM

## 2013-01-15 DIAGNOSIS — I639 Cerebral infarction, unspecified: Secondary | ICD-10-CM

## 2013-01-16 ENCOUNTER — Ambulatory Visit: Payer: Medicare Other | Admitting: Occupational Therapy

## 2013-01-16 ENCOUNTER — Ambulatory Visit: Payer: Medicare Other | Admitting: Physical Therapy

## 2013-01-17 ENCOUNTER — Ambulatory Visit: Payer: Medicare Other | Admitting: Physical Therapy

## 2013-01-17 ENCOUNTER — Encounter: Payer: Medicare Other | Admitting: Occupational Therapy

## 2013-01-18 ENCOUNTER — Ambulatory Visit: Payer: Medicare Other | Admitting: Physical Therapy

## 2013-01-18 ENCOUNTER — Ambulatory Visit: Payer: Medicare Other | Admitting: Occupational Therapy

## 2013-01-22 ENCOUNTER — Encounter: Payer: Medicare Other | Admitting: Occupational Therapy

## 2013-01-22 ENCOUNTER — Ambulatory Visit: Payer: Medicare Other | Admitting: Physical Therapy

## 2013-01-23 ENCOUNTER — Ambulatory Visit: Payer: Medicare Other | Admitting: Physical Therapy

## 2013-01-23 ENCOUNTER — Ambulatory Visit: Payer: Medicare Other | Admitting: Occupational Therapy

## 2013-01-24 ENCOUNTER — Encounter: Payer: Medicare Other | Admitting: Occupational Therapy

## 2013-01-24 ENCOUNTER — Ambulatory Visit: Payer: Medicare Other | Admitting: Physical Therapy

## 2013-01-25 ENCOUNTER — Ambulatory Visit: Payer: Medicare Other | Admitting: Physical Therapy

## 2013-01-25 ENCOUNTER — Ambulatory Visit: Payer: Medicare Other | Admitting: Occupational Therapy

## 2013-01-29 ENCOUNTER — Encounter: Payer: Medicare Other | Admitting: Occupational Therapy

## 2013-01-29 ENCOUNTER — Ambulatory Visit: Payer: Medicare Other | Admitting: Physical Therapy

## 2013-01-30 ENCOUNTER — Ambulatory Visit: Payer: Self-pay | Admitting: Cardiovascular Disease

## 2013-01-30 ENCOUNTER — Ambulatory Visit: Payer: Medicare Other | Admitting: Occupational Therapy

## 2013-01-30 ENCOUNTER — Ambulatory Visit: Payer: Medicare Other | Admitting: Physical Therapy

## 2013-01-30 DIAGNOSIS — Z7901 Long term (current) use of anticoagulants: Secondary | ICD-10-CM

## 2013-01-30 LAB — POCT INR: INR: 1.7

## 2013-01-31 ENCOUNTER — Ambulatory Visit: Payer: Medicare Other | Admitting: Physical Therapy

## 2013-01-31 ENCOUNTER — Encounter: Payer: Medicare Other | Admitting: Occupational Therapy

## 2013-02-01 ENCOUNTER — Ambulatory Visit: Payer: Medicare Other | Admitting: Physical Therapy

## 2013-02-01 ENCOUNTER — Ambulatory Visit: Payer: Medicare Other | Admitting: Occupational Therapy

## 2013-02-06 ENCOUNTER — Ambulatory Visit: Payer: Medicare Other | Admitting: Physical Therapy

## 2013-02-06 ENCOUNTER — Ambulatory Visit: Payer: Medicare Other | Admitting: Occupational Therapy

## 2013-02-07 ENCOUNTER — Telehealth: Payer: Self-pay | Admitting: Internal Medicine

## 2013-02-07 NOTE — Telephone Encounter (Signed)
No problem.

## 2013-02-07 NOTE — Telephone Encounter (Signed)
Spoke with pt's son He wanted to let Dr [name] know that he has started taking gabapentin 100 mg tid This is to help with his shoulder pain Pt is asking if this will affect other meds he takes and if he should make any changes MAR updated Please advise, thanks!

## 2013-02-08 ENCOUNTER — Ambulatory Visit: Payer: Medicare Other | Admitting: Occupational Therapy

## 2013-02-08 ENCOUNTER — Ambulatory Visit: Payer: Medicare Other | Admitting: Physical Therapy

## 2013-02-08 NOTE — Telephone Encounter (Signed)
LM for son that No problem with pt taking Gabapentin per Dr [name]

## 2013-02-13 ENCOUNTER — Ambulatory Visit: Payer: Medicare Other | Admitting: Occupational Therapy

## 2013-02-13 ENCOUNTER — Ambulatory Visit: Payer: Medicare Other | Admitting: Physical Therapy

## 2013-02-13 ENCOUNTER — Ambulatory Visit (INDEPENDENT_AMBULATORY_CARE_PROVIDER_SITE_OTHER): Payer: Self-pay | Admitting: Internal Medicine

## 2013-02-13 DIAGNOSIS — Z7901 Long term (current) use of anticoagulants: Secondary | ICD-10-CM

## 2013-02-13 LAB — POCT INR: INR: 2.4

## 2013-02-15 ENCOUNTER — Ambulatory Visit: Payer: Medicare Other | Admitting: Occupational Therapy

## 2013-02-15 ENCOUNTER — Ambulatory Visit: Payer: Medicare Other | Admitting: Physical Therapy

## 2013-02-20 ENCOUNTER — Ambulatory Visit: Payer: Medicare Other | Admitting: Occupational Therapy

## 2013-02-20 ENCOUNTER — Ambulatory Visit: Payer: Medicare Other | Admitting: Physical Therapy

## 2013-02-21 ENCOUNTER — Other Ambulatory Visit: Payer: Self-pay | Admitting: Dermatology

## 2013-02-22 ENCOUNTER — Ambulatory Visit: Payer: Medicare Other | Admitting: Physical Therapy

## 2013-02-22 ENCOUNTER — Ambulatory Visit: Payer: Medicare Other | Admitting: Occupational Therapy

## 2013-03-06 ENCOUNTER — Ambulatory Visit (INDEPENDENT_AMBULATORY_CARE_PROVIDER_SITE_OTHER): Payer: Self-pay | Admitting: Cardiovascular Disease

## 2013-03-06 DIAGNOSIS — Z7901 Long term (current) use of anticoagulants: Secondary | ICD-10-CM

## 2013-03-06 DIAGNOSIS — I639 Cerebral infarction, unspecified: Secondary | ICD-10-CM

## 2013-03-06 DIAGNOSIS — I6350 Cerebral infarction due to unspecified occlusion or stenosis of unspecified cerebral artery: Secondary | ICD-10-CM

## 2013-03-08 ENCOUNTER — Other Ambulatory Visit: Payer: Self-pay | Admitting: Internal Medicine

## 2013-03-08 MED ORDER — WARFARIN SODIUM 3 MG PO TABS: 3.0000 mg | ORAL_TABLET | ORAL | Status: DC

## 2013-03-18 ENCOUNTER — Other Ambulatory Visit: Payer: Self-pay | Admitting: Internal Medicine

## 2013-04-03 ENCOUNTER — Ambulatory Visit (INDEPENDENT_AMBULATORY_CARE_PROVIDER_SITE_OTHER): Payer: Medicare Other

## 2013-04-03 DIAGNOSIS — I639 Cerebral infarction, unspecified: Secondary | ICD-10-CM

## 2013-04-03 DIAGNOSIS — Z7901 Long term (current) use of anticoagulants: Secondary | ICD-10-CM

## 2013-04-03 DIAGNOSIS — I6350 Cerebral infarction due to unspecified occlusion or stenosis of unspecified cerebral artery: Secondary | ICD-10-CM

## 2013-04-05 ENCOUNTER — Ambulatory Visit: Payer: Medicare Other | Admitting: Internal Medicine

## 2013-04-25 ENCOUNTER — Other Ambulatory Visit (INDEPENDENT_AMBULATORY_CARE_PROVIDER_SITE_OTHER): Payer: Medicare Other

## 2013-04-25 ENCOUNTER — Ambulatory Visit (INDEPENDENT_AMBULATORY_CARE_PROVIDER_SITE_OTHER): Payer: Medicare Other | Admitting: Internal Medicine

## 2013-04-25 ENCOUNTER — Encounter: Payer: Self-pay | Admitting: Internal Medicine

## 2013-04-25 VITALS — BP 124/72 | HR 62 | Temp 98.4°F | Ht 63.5 in | Wt 167.0 lb

## 2013-04-25 DIAGNOSIS — I639 Cerebral infarction, unspecified: Secondary | ICD-10-CM

## 2013-04-25 DIAGNOSIS — I6350 Cerebral infarction due to unspecified occlusion or stenosis of unspecified cerebral artery: Secondary | ICD-10-CM

## 2013-04-25 DIAGNOSIS — R609 Edema, unspecified: Secondary | ICD-10-CM

## 2013-04-25 DIAGNOSIS — I10 Essential (primary) hypertension: Secondary | ICD-10-CM

## 2013-04-25 LAB — BASIC METABOLIC PANEL
CO2: 28 mEq/L (ref 19–32)
Chloride: 104 mEq/L (ref 96–112)
Potassium: 4.4 mEq/L (ref 3.5–5.1)
Sodium: 138 mEq/L (ref 135–145)

## 2013-04-25 NOTE — Progress Notes (Signed)
Subjective:     Patient ID: [name], male   DOB: Sep 14, 1919    MRN: [id number]  HPI  9 yowm status post devastating stroke involving the right brain stem in 1999. He has been maintained on Coumadin since then and mainly complains of intermittent dizziness dating all the way back to his stroke which is controlled on p.r.n. meclizine.  seen 04/19/08 for fu ok except ? recurrent melanoma left leg   August 26, 2008 cleared for melanoma surgery by Cards     December 02, 2010--Presents for a 4 week follow up. Last ov spironolactone was stopped due to gynecomastia. He was started on Lasix however he stopped this 2 weeks Prior to day of OV  "it makes him urinate too much". We discussed several options on diuretics and explained that the spironolactone can cause gynecomastia. Would also like a note for therapy for his right hand. Previous therapy in past has helped.>stoppped spironolactone/ rx lasix  09/20/2011 f/u ov/Wert cc here for sugery clearance.  Bed > w/c but doing his own transfer due to right leg with problems with rehab R Femur fx May 2012.  rec Ok for leg surgery but need to stop the coumadin for 5 days before and you will need to be checked immediately before surgery to make sure you clot properly    03/23/2012 f/u ov/Wert cc swelling better on present rx, no sob or cough. Swallowing ok, no change activity tolerance. rec Please remember to go to the lab > bun 29/creat 1.3   06/28/2012 f/u ov/Wert cc R hand gradually getting weaker x 6 months, no longer getting any pt or neuro f/u but able to live in independent living so far. No other neuro complaints, speech and mentation not affected.  No ha or nausea. rec Please see patient coordinator before you leave today  to schedule PT for R hand   09/25/2012 f/u ov/Wert cc multiple concerns re chronic  R hand weak, not happy with PT eval at NH, son thinks needs new w/c with more features given his level of dysfunction.  Has not  returned to neuro or rehab for so long he doesn't remember the doctors he saw re these issues..  rec Change furosemide to every other day dosing   12/25/2012 f/u ov/Wert cc  Mobility improving, f/u by Anne Hahn, PT, OT.  varaibly swelling L Leg, better when elevated with no sob or cough. rec Compression hose prn  04/25/2013 f/u ov/Wert re hbp, dependent edema Chief Complaint  Patient presents with  . Follow-up    Pt states LE edema improving. He c/o not being able to use right hand, it's not painful but he is not able to grasp things any longer.    has not been back to Dr Anne Hahn. Has also had some weakness in L leg. No sob, no cp.  No obvious daytime variabilty or assoc chronic cough or cp or chest tightness, subjective wheeze overt sinus or hb symptoms. No unusual exp hx or h/o childhood pna/ asthma or premature birth to his knowledge.   Sleeping ok without nocturnal  or early am exacerbation  of respiratory  c/o's  . Also denies any obvious fluctuation of symptoms with weather or environmental changes or other aggravating or alleviating factors except as outlined above   ROS  The following are not active complaints unless bolded sore throat, dysphagia, dental problems, itching, sneezing,  nasal congestion or excess/ purulent secretions, ear ache,   fever, chills, sweats, unintended wt loss,  pleuritic or exertional cp, hemoptysis,  orthopnea pnd or leg swelling, presyncope, palpitations, heartburn, abdominal pain, anorexia, nausea, vomiting, diarrhea  or change in bowel or urinary habits, change in stools or urine, dysuria,hematuria,  rash, arthralgias, visual complaints, headache, numbness weakness or ataxia or problems with walking or coordination,  change in mood/affect or memory.           Past Medical History:  BLINDNESS, ONE EYE (ICD-369.60)  CVA  SINUS BRADYCARDIA (ICD-427.81)  DIVERTICULOSIS, COLON (ICD-562.10)  Internal hemorrhoids  COUGH, CHRONIC (ICD-786.2)  PVD (ICD-443.9)   HYPERLIPIDEMIA (ICD-272.4)  HYPERTENSION (ICD-401.9)  - breast tenderness on aldactone August 25, 2010 > d/c October 29, 2010  Hyperkalemia on aldactone October 14, 2008 > d/c October 28, 2010 due to gynecomastia  Melanoma, skin graft 2003..............................................Marland KitchenJorja Loa [name] (surgery) - repeat skin graft 10/09  L Leg swelling  - Venous dopplers neg 01/18/11  COPD  OSA on CPAP  GERD  Health Maintenance ..................................................................Marland KitchenWert  - Td 03/2005  - Pneumovax 1998           Objective:   Physical Exam very frail wm c/w bound at this point. hopleless affect 193 July 22, 2010 > 193 August 25, 2010 >  >190 12/02/10>193 January 13, 2011 > 09/20/2011 > 12/16/2011  169 > 03/23/2012  163 > 06/28/2012  174 > 09/25/2012  166 > 166 12/25/2012 > 167 04/26/2013  GEN: A/Ox3; pleasant , NAD  HEENT: [place]/AT, , EACs-clear, TMs-wnl, NOSE-clear, THROAT-clear   NECK: Supple w/ fair ROM; no JVD; normal carotid impulses w/o brui.ts; no thyromegaly or nodules palpated; no lymphadenopathy.  RESP Clear to P & A; w/o, wheezes/ rales/ or rhonchi.  CARD: RRR, no m/r/g  GI: Soft & nt; nml bowel sounds; no organomegaly or masses detected.  Musco: Warm bil, no calf tenderness, min edema, no clubbing, pulses intact Neuro alert, ? Depressed affect.  R hand contracted with minimal dorsiflexion, no  new findings       CXR  09/20/2011 :  -No evidence of acute cardiopulmonary disease.  Stable cardiomegaly.   Assessment:         Plan:

## 2013-04-25 NOTE — Patient Instructions (Addendum)
Please remember to go to the lab department downstairs for your tests - we will call you with the results when they are available.  Ok to just tight fitting stockings (knee highs)  Call Dr Anne Hahn for follow up on your right hand problems and your L leg weakness   Please schedule a follow up visit in 3 months but call sooner if needed

## 2013-04-25 NOTE — Progress Notes (Signed)
Quick Note:  Spoke with pt and notified of results per Dr. Wert. Pt verbalized understanding and denied any questions.  ______ 

## 2013-04-26 NOTE — Assessment & Plan Note (Addendum)
Related to previous surgery for melanoma, well controlled on present rx, can probably just use knee high elastic socks at this point

## 2013-04-26 NOTE — Assessment & Plan Note (Addendum)
Lab Results  Component Value Date   CREATININE 1.2 04/25/2013   CREATININE 1.2 12/25/2012   CREATININE 1.15 05/16/2012     Adequate control on present rx>   Each maintenance medication was reviewed in detail including most importantly the difference between maintenance and as needed and under what circumstances the prns are to be used.  Please see instructions for details which were reviewed in writing and the patient given a copy.

## 2013-04-26 NOTE — Assessment & Plan Note (Signed)
rec f/u with Dr Anne Hahn but nothing further to do from primary care perspective

## 2013-04-27 ENCOUNTER — Telehealth: Payer: Self-pay | Admitting: Neurology

## 2013-04-27 NOTE — Telephone Encounter (Signed)
called and spoke with son and he wants his father reaccessed by [facility] (denied ) for power wheelchair,they do not completely understand patient's limitations, need more information.Wants to also be placed on cancellation list for sooner appt.(on cancellation list).

## 2013-04-30 NOTE — Telephone Encounter (Signed)
Called and I left a message. I am not clear as to why the power wheelchair request was denied. I'll be happy to do another assessment sometime in the next several weeks. It would be helpful however, to know reason for the denial.

## 2013-05-01 ENCOUNTER — Ambulatory Visit (INDEPENDENT_AMBULATORY_CARE_PROVIDER_SITE_OTHER): Payer: Medicare Other | Admitting: Cardiology

## 2013-05-01 DIAGNOSIS — Z7901 Long term (current) use of anticoagulants: Secondary | ICD-10-CM

## 2013-05-01 DIAGNOSIS — I639 Cerebral infarction, unspecified: Secondary | ICD-10-CM

## 2013-05-01 DIAGNOSIS — I6350 Cerebral infarction due to unspecified occlusion or stenosis of unspecified cerebral artery: Secondary | ICD-10-CM

## 2013-05-02 ENCOUNTER — Telehealth: Payer: Self-pay | Admitting: *Deleted

## 2013-05-02 NOTE — Telephone Encounter (Signed)
I called and left a message for the patient's son to callback to the office to schedule an appt.

## 2013-05-09 ENCOUNTER — Telehealth: Payer: Self-pay | Admitting: Neurology

## 2013-05-09 NOTE — Telephone Encounter (Signed)
I called and spoke with patient's son to schedule appt. for 05-14-13@12 

## 2013-05-14 ENCOUNTER — Encounter: Payer: Self-pay | Admitting: Neurology

## 2013-05-14 ENCOUNTER — Telehealth: Payer: Self-pay | Admitting: Neurology

## 2013-05-14 DIAGNOSIS — D518 Other vitamin B12 deficiency anemias: Secondary | ICD-10-CM

## 2013-05-14 DIAGNOSIS — I679 Cerebrovascular disease, unspecified: Secondary | ICD-10-CM

## 2013-05-14 NOTE — Telephone Encounter (Signed)
I called and spoke with patient's son about r/s appt.. Patient has r/s to 05-16-13@10 :30.

## 2013-05-15 ENCOUNTER — Ambulatory Visit (INDEPENDENT_AMBULATORY_CARE_PROVIDER_SITE_OTHER): Payer: Medicare Other | Admitting: Cardiology

## 2013-05-15 DIAGNOSIS — Z7901 Long term (current) use of anticoagulants: Secondary | ICD-10-CM

## 2013-05-15 DIAGNOSIS — I6350 Cerebral infarction due to unspecified occlusion or stenosis of unspecified cerebral artery: Secondary | ICD-10-CM

## 2013-05-15 DIAGNOSIS — I639 Cerebral infarction, unspecified: Secondary | ICD-10-CM

## 2013-05-15 LAB — POCT INR: INR: 3.2

## 2013-05-16 ENCOUNTER — Encounter: Payer: Self-pay | Admitting: Neurology

## 2013-05-16 ENCOUNTER — Ambulatory Visit (INDEPENDENT_AMBULATORY_CARE_PROVIDER_SITE_OTHER): Payer: Medicare Other | Admitting: Neurology

## 2013-05-16 VITALS — BP 148/73 | HR 56 | Wt 160.0 lb

## 2013-05-16 DIAGNOSIS — R269 Unspecified abnormalities of gait and mobility: Secondary | ICD-10-CM | POA: Insufficient documentation

## 2013-05-16 DIAGNOSIS — I679 Cerebrovascular disease, unspecified: Secondary | ICD-10-CM

## 2013-05-16 HISTORY — DX: Unspecified abnormalities of gait and mobility: R26.9

## 2013-05-16 NOTE — Progress Notes (Signed)
Reason for visit: Mobility assessment  [name] is an 79 y.o. male  History of present illness:  [name] is a 79 year old right-handed white male with a history of cerebrovascular disease and a gait disorder. The patient has sustained a right hemiparesis that affects the arms somewhat more than the leg. The patient has a chronic gait disorder associated with a circumduction gait on the right leg. The patient lives in an assisted living situation, but he is able to live relatively independently in an apartment. The patient is performing all of his own activities of daily living. The patient generally gets around in a standard wheelchair by walking with his legs. The patient is functionally nonambulatory, able only to stand with a cane, and transfer. The patient has fallen in the past, and he has sustained a recent fall within the last 10 days with injuries to his knees. The patient has cervical spondylosis, and he reports discomfort down the left arm to the elbow. The patient is getting epidural steroid injections in the neck. The patient has undergone a CT scan evaluation of the brain recently that shows chronic small vessel disease, unchanged from May of 2012.  Currently, the patient is being assessed for the possibility of a power wheelchair. Part of the reason for this revisit is a mobility assessment. The patient is able to perform all of his toileting needs, dressing, feeding, grooming, and bathing. The patient has an unsteady gait, and he has fallen in the past sustaining injury. The patient has significant dysfunction of the right upper extremity, with a flexion contracture at the wrist and elbow that limits his use of the hand and arm. The patient is unable to fully abduct the right arm. The patient has poor balance, and he is at risk for falls. The patient has some instability with his truncal muscles, making a scooter unsafe for him. The patient has pain associated with the left  shoulder, and the use of the arms to maneuver a manual wheelchair is difficult. The patient has only the left leg to use for mobility with a wheelchair. The patient is quite normal with his cognitive abilities, and he has already had a trial using a power wheelchair for several days, and he did quite well with this. The patient is physically and mentally capable of operating a power wheelchair. The patient is willing and motivated to use a wheelchair, as this improves his mobility and safety.  Past Medical History  Diagnosis Date  . GERD (gastroesophageal reflux disease)   . Hypertension   . Hearing loss     wears hearing aid - right side  . Leg swelling   . Trouble swallowing   . Bruises easily     due to coumadin  . Weakness     difficulty walking  . Contact lens/glasses fitting   . Heart disease   . Stroke 1999  . Sleep apnea     uses cpap, pt does not know setting  . Cancer   . 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 Gait disturbance   . Dyslipidemia   . COPD (chronic obstructive pulmonary disease)   . Peripheral vascular disease   . History of melanoma   . Diverticulosis   . Abnormality of gait 05/16/2013    Past Surgical History  Procedure Laterality Date  . Excision of melanoma  2009    left leg  . Melanoma excision      many melanoma removed in past  . Melanoma  excision  10/11/2011    Procedure: MELANOMA EXCISION;  Surgeon: Valarie Merino, MD;  Location: [facility] OR;  Service: General;  Laterality: Left;  EXCISION melanoma left leg with full thickness skin grafting from left lower abdomen.  . Joint replacement  2012    r fem head fx  . Lung benign area removed   1970's  . Hernia repair  1983  . Melanoma excision  05/16/2012    Procedure: MELANOMA EXCISION;  Surgeon: Valarie Merino, MD;  Location: [facility] ORS;  Service: General;  Laterality: Left;  Nodule Removal of Melanoma on Left Shin    Family History  Problem Relation Age of Onset  . Heart disease Father   . Cancer Son      prostate  . Dementia Sister     One sister has dementia    Social history:  reports that he quit smoking about 48 years ago. His smoking use included Pipe. He has never used smokeless tobacco. He reports that he does not drink alcohol or use illicit drugs.  Allergies:  Allergies  Allergen Reactions  . Sulfonamide Derivatives Rash    REACTION: rash    Medications:  Current Outpatient Prescriptions on File Prior to Visit  Medication Sig Dispense Refill  . acetaminophen (TYLENOL) 325 MG tablet Take 650 mg by mouth as needed.      . furosemide (LASIX) 20 MG tablet 20 mg daily.       Marland Kitchen gabapentin (NEURONTIN) 100 MG capsule Take 100-200 mg by mouth daily.       . meclizine (ANTIVERT) 25 MG tablet Take 25 mg by mouth 3 (three) times daily as needed. For dizziness       . omeprazole (PRILOSEC OTC) 20 MG tablet Take 1 tablet (20 mg total) by mouth daily.  42 tablet  11  . omeprazole (PRILOSEC) 20 MG capsule Take 20 mg by mouth daily.      . ranitidine (ZANTAC) 150 MG tablet TAKE 1 TABLET TWICE DAILY.  180 tablet  3  . tetrahydrozoline 0.05 % ophthalmic solution Place 1 drop into both eyes as needed. For dry eyes      . warfarin (COUMADIN) 3 MG tablet 3 mg daily. Take eight (8) tablets weekly       No current facility-administered medications on file prior to visit.    ROS:  Out of a complete 14 system review of symptoms, the patient complains only of the following symptoms, and all other reviewed systems are negative.  Swelling in the legs Hearing loss, difficulty swallowing Easy bleeding Joint pain Gait instability, multiple falls Weakness Snoring  Blood pressure 148/73, pulse 56, weight 160 lb (72.576 kg).  Physical Exam  General: The patient is alert and cooperative at the time of the examination.  Skin: No significant peripheral edema is noted. The patient has excoriations along both knees.   Neurologic Exam  Cranial nerves: Facial symmetry is present. Speech is  slightly dysarthric, mildly aphasic. Extraocular movements are full. Visual fields are full. The left eye is blind.  Motor: The patient has good strength in the left extremities. The right arm is in flexion, and the fingers of the right hand are in flexion. The patient has 4/5 strength of the right arm and right leg, with increased motor tone on the side.  Coordination: The patient has good finger-nose-finger and heel-to-shin on the left, the patient has difficulty performing on the right.  Gait and station: The patient is able to arise from a seated  position with a cane. The patient is not functionally able to ambulate more than one or 2 steps. Gait is unsteady.  Reflexes: Deep tendon reflexes are notable for right-sided hyperreflexia.   Assessment/Plan:  One. Cerebrovascular disease, right hemiparesis  2. Gait disturbance  The patient has significant limitations in his ability to ambulate. The patient can stand with a cane, but he can only take a few short steps. The patient has very limited mobility, and he is not able to functionally use a manual wheelchair secondary to his right hemiparesis. The patient is cognitively capable of managing a power wheelchair. The patient in the past has requested a tilt feature and recline feature of a wheelchair, but he does not have active skin breakdown or pressure sores. The patient does require a power chair at any rate for mobility inside and outside of the house. The patient will followup through this office on an as-needed basis. The patient should be an excellent candidate for a power wheelchair.  Marlan Palau MD 05/16/2013 7:49 PM  [facility] [address] [place], [place] [address]  Phone [phone] Fax [phone]

## 2013-05-29 ENCOUNTER — Emergency Department (HOSPITAL_COMMUNITY): Payer: Medicare Other

## 2013-05-29 ENCOUNTER — Emergency Department (HOSPITAL_COMMUNITY)
Admission: EM | Admit: 2013-05-29 | Discharge: 2013-05-29 | Disposition: A | Discharge status: Skilled Nursing Facility | Payer: Medicare Other | Attending: Emergency Medicine | Admitting: Emergency Medicine

## 2013-05-29 ENCOUNTER — Encounter (HOSPITAL_COMMUNITY): Payer: Self-pay | Admitting: Emergency Medicine

## 2013-05-29 ENCOUNTER — Ambulatory Visit (INDEPENDENT_AMBULATORY_CARE_PROVIDER_SITE_OTHER): Payer: Medicare Other | Admitting: Internal Medicine

## 2013-05-29 DIAGNOSIS — Z79899 Other long term (current) drug therapy: Secondary | ICD-10-CM | POA: Insufficient documentation

## 2013-05-29 DIAGNOSIS — Z8719 Personal history of other diseases of the digestive system: Secondary | ICD-10-CM | POA: Insufficient documentation

## 2013-05-29 DIAGNOSIS — W050XXA Fall from non-moving wheelchair, initial encounter: Secondary | ICD-10-CM | POA: Insufficient documentation

## 2013-05-29 DIAGNOSIS — Z85820 Personal history of malignant melanoma of skin: Secondary | ICD-10-CM | POA: Insufficient documentation

## 2013-05-29 DIAGNOSIS — I639 Cerebral infarction, unspecified: Secondary | ICD-10-CM

## 2013-05-29 DIAGNOSIS — Z9181 History of falling: Secondary | ICD-10-CM

## 2013-05-29 DIAGNOSIS — Z8679 Personal history of other diseases of the circulatory system: Secondary | ICD-10-CM | POA: Insufficient documentation

## 2013-05-29 DIAGNOSIS — Z87891 Personal history of nicotine dependence: Secondary | ICD-10-CM | POA: Insufficient documentation

## 2013-05-29 DIAGNOSIS — R42 Dizziness and giddiness: Secondary | ICD-10-CM | POA: Insufficient documentation

## 2013-05-29 DIAGNOSIS — R509 Fever, unspecified: Secondary | ICD-10-CM

## 2013-05-29 DIAGNOSIS — I10 Essential (primary) hypertension: Secondary | ICD-10-CM | POA: Insufficient documentation

## 2013-05-29 DIAGNOSIS — Z8673 Personal history of transient ischemic attack (TIA), and cerebral infarction without residual deficits: Secondary | ICD-10-CM | POA: Insufficient documentation

## 2013-05-29 DIAGNOSIS — E785 Hyperlipidemia, unspecified: Secondary | ICD-10-CM | POA: Insufficient documentation

## 2013-05-29 DIAGNOSIS — Z7901 Long term (current) use of anticoagulants: Secondary | ICD-10-CM | POA: Insufficient documentation

## 2013-05-29 DIAGNOSIS — Z8739 Personal history of other diseases of the musculoskeletal system and connective tissue: Secondary | ICD-10-CM | POA: Insufficient documentation

## 2013-05-29 DIAGNOSIS — Y9389 Activity, other specified: Secondary | ICD-10-CM | POA: Insufficient documentation

## 2013-05-29 DIAGNOSIS — J029 Acute pharyngitis, unspecified: Secondary | ICD-10-CM | POA: Insufficient documentation

## 2013-05-29 DIAGNOSIS — W19XXXA Unspecified fall, initial encounter: Secondary | ICD-10-CM

## 2013-05-29 DIAGNOSIS — I6350 Cerebral infarction due to unspecified occlusion or stenosis of unspecified cerebral artery: Secondary | ICD-10-CM

## 2013-05-29 DIAGNOSIS — J449 Chronic obstructive pulmonary disease, unspecified: Secondary | ICD-10-CM | POA: Insufficient documentation

## 2013-05-29 DIAGNOSIS — Y9210 Unspecified residential institution as the place of occurrence of the external cause: Secondary | ICD-10-CM | POA: Insufficient documentation

## 2013-05-29 DIAGNOSIS — J4489 Other specified chronic obstructive pulmonary disease: Secondary | ICD-10-CM | POA: Insufficient documentation

## 2013-05-29 DIAGNOSIS — R5381 Other malaise: Secondary | ICD-10-CM | POA: Insufficient documentation

## 2013-05-29 DIAGNOSIS — Z8669 Personal history of other diseases of the nervous system and sense organs: Secondary | ICD-10-CM | POA: Insufficient documentation

## 2013-05-29 HISTORY — DX: History of falling: Z91.81

## 2013-05-29 LAB — CBC WITH DIFFERENTIAL/PLATELET
Eosinophils Absolute: 0 10*3/uL (ref 0.0–0.7)
Eosinophils Relative: 0 % (ref 0–5)
HCT: 40.3 % (ref 39.0–52.0)
Hemoglobin: 13.3 g/dL (ref 13.0–17.0)
Lymphocytes Relative: 7 % — ABNORMAL LOW (ref 12–46)
Lymphs Abs: 0.8 10*3/uL (ref 0.7–4.0)
MCH: 29.4 pg (ref 26.0–34.0)
MCV: 89.2 fL (ref 78.0–100.0)
Monocytes Absolute: 0.8 10*3/uL (ref 0.1–1.0)
Monocytes Relative: 8 % (ref 3–12)
Platelets: 340 10*3/uL (ref 150–400)
RBC: 4.52 MIL/uL (ref 4.22–5.81)

## 2013-05-29 LAB — POCT I-STAT, CHEM 8
BUN: 22 mg/dL (ref 6–23)
Calcium, Ion: 1.12 mmol/L — ABNORMAL LOW (ref 1.13–1.30)
Glucose, Bld: 95 mg/dL (ref 70–99)
TCO2: 27 mmol/L (ref 0–100)

## 2013-05-29 LAB — POCT INR: INR: 1.8

## 2013-05-29 LAB — URINALYSIS, ROUTINE W REFLEX MICROSCOPIC
Bilirubin Urine: NEGATIVE
Glucose, UA: NEGATIVE mg/dL
Hgb urine dipstick: NEGATIVE
Specific Gravity, Urine: 1.017 (ref 1.005–1.030)
Urobilinogen, UA: 0.2 mg/dL (ref 0.0–1.0)

## 2013-05-29 LAB — PROTIME-INR
INR: 1.62 — ABNORMAL HIGH (ref 0.00–1.49)
Prothrombin Time: 18.7 seconds — ABNORMAL HIGH (ref 11.6–15.2)

## 2013-05-29 LAB — POCT I-STAT TROPONIN I: Troponin i, poc: 0.01 ng/mL (ref 0.00–0.08)

## 2013-05-29 MED ORDER — ACETAMINOPHEN 325 MG PO TABS
650.0000 mg | ORAL_TABLET | Freq: Once | ORAL | Status: AC
  Administered 2013-05-29: 650 mg via ORAL
  Filled 2013-05-29: qty 2

## 2013-05-29 MED ORDER — TETANUS-DIPHTH-ACELL PERTUSSIS 5-2.5-18.5 LF-MCG/0.5 IM SUSP
0.5000 mL | Freq: Once | INTRAMUSCULAR | Status: AC
  Administered 2013-05-29: 0.5 mL via INTRAMUSCULAR
  Filled 2013-05-29: qty 0.5

## 2013-05-29 NOTE — ED Provider Notes (Signed)
History    CSN: [id number] Arrival date & time 05/29/13  1449  First MD Initiated Contact with Patient 05/29/13 1451     Chief Complaint  Patient presents with  . Fall   (Consider location/radiation/quality/duration/timing/severity/associated sxs/prior Treatment) HPI Comments:  patient from nursing home after mechanical fall. He weak on R side from previous stroke and slipped while trying to get to a different chair. Denies hitting head or losing consciousness. He has an abrasion to his left eyebrow from dropping his cell phone. Denies any dizziness, lightheadedness, chest pain or shortness of breath. Has right-sided weakness at baseline and is unchanged. States he is wheelchair-bound. He is on Coumadin. His level today was 1.8.  The history is provided by the patient and the EMS personnel.   Past Medical History  Diagnosis Date  . GERD (gastroesophageal reflux disease)   . Hypertension   . Hearing loss     wears hearing aid - right side  . Leg swelling   . Trouble swallowing   . Bruises easily     due to coumadin  . Weakness     difficulty walking  . Contact lens/glasses fitting   . Heart disease   . Stroke 1999  . Sleep apnea     uses cpap, pt does not know setting  . Cancer   . 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 Gait disturbance   . Dyslipidemia   . COPD (chronic obstructive pulmonary disease)   . Peripheral vascular disease   . History of melanoma   . Diverticulosis   . Abnormality of gait 05/16/2013   Past Surgical History  Procedure Laterality Date  . Excision of melanoma  2009    left leg  . Melanoma excision      many melanoma removed in past  . Melanoma excision  10/11/2011    Procedure: MELANOMA EXCISION;  Surgeon: Valarie Merino, MD;  Location: [facility] OR;  Service: General;  Laterality: Left;  EXCISION melanoma left leg with full thickness skin grafting from left lower abdomen.  . Joint replacement  2012    r fem head fx  . Lung benign area removed   1970's  .  Hernia repair  1983  . Melanoma excision  05/16/2012    Procedure: MELANOMA EXCISION;  Surgeon: Valarie Merino, MD;  Location: [facility];  Service: General;  Laterality: Left;  Nodule Removal of Melanoma on Left Shin   Family History  Problem Relation Age of Onset  . Heart disease Father   . Cancer Son     prostate  . Dementia Sister     One sister has dementia   History  Substance Use Topics  . Smoking status: Former Smoker    Types: Pipe    Quit date: 08/31/1964  . Smokeless tobacco: Never Used  . Alcohol Use: No    Review of Systems  Constitutional: Negative for fever, activity change and appetite change.  HENT: Negative for congestion and rhinorrhea.   Respiratory: Negative for cough, chest tightness and shortness of breath.   Cardiovascular: Negative for chest pain.  Gastrointestinal: Negative for nausea, vomiting and abdominal pain.  Genitourinary: Negative for dysuria and hematuria.  Musculoskeletal: Negative for back pain.  Skin: Negative for rash.  Neurological: Positive for weakness. Negative for dizziness, light-headedness and headaches.  A complete 10 system review of systems was obtained and all systems are negative except as noted in the HPI and PMH.    Allergies  Sulfonamide derivatives  Home  Medications   Current Outpatient Rx  Name  Route  Sig  Dispense  Refill  . acetaminophen (TYLENOL) 325 MG tablet   Oral   Take 650 mg by mouth every 6 (six) hours as needed for pain.          . furosemide (LASIX) 20 MG tablet      20 mg daily.          . hydroxypropyl methylcellulose (ISOPTO TEARS) 2.5 % ophthalmic solution      1 drop.         . meclizine (ANTIVERT) 25 MG tablet   Oral   Take 12.5-25 mg by mouth 3 (three) times daily as needed for dizziness.          Marland Kitchen omeprazole (PRILOSEC OTC) 20 MG tablet   Oral   Take 1 tablet (20 mg total) by mouth daily.   42 tablet   11   . warfarin (COUMADIN) 3 MG tablet   Oral   Take 3 mg by mouth  daily.           BP 112/50  Pulse 82  Temp(Src) 100.7 F (38.2 C) (Oral)  SpO2 90% Physical Exam  Constitutional: He is oriented to person, place, and time. He appears well-developed and well-nourished. No distress.  HENT:  Head: Normocephalic and atraumatic.  Mouth/Throat: Oropharynx is clear and moist. No oropharyngeal exudate.  Abrasion L eyebrow Opacified cornea  Eyes: Conjunctivae and EOM are normal. Pupils are equal, round, and reactive to light.  Neck: Normal range of motion. Neck supple.  No C spine pain, stepoff or deformity No meningismus  Cardiovascular: Normal rate, regular rhythm and normal heart sounds.   No murmur heard. Pulmonary/Chest: Effort normal and breath sounds normal. No respiratory distress.  Abdominal: Soft. There is no tenderness. There is no rebound and no guarding.  Musculoskeletal: Normal range of motion. He exhibits no edema and no tenderness.  Neurological: He is alert and oriented to person, place, and time.  R sided weakness and contractures at baseline  Skin: Skin is warm.    ED Course  Procedures (including critical care time) Labs Reviewed  PROTIME-INR - Abnormal; Notable for the following:    Prothrombin Time 18.7 (*)    INR 1.62 (*)    All other components within normal limits  URINALYSIS, ROUTINE W REFLEX MICROSCOPIC - Abnormal; Notable for the following:    Ketones, ur 15 (*)    All other components within normal limits  CBC WITH DIFFERENTIAL - Abnormal; Notable for the following:    WBC 10.6 (*)    Neutrophils Relative % 85 (*)    Neutro Abs 9.0 (*)    Lymphocytes Relative 7 (*)    All other components within normal limits  POCT I-STAT, CHEM 8 - Abnormal; Notable for the following:    Calcium, Ion 1.12 (*)    All other components within normal limits  RAPID STREP SCREEN  CULTURE, GROUP A STREP  POCT I-STAT TROPONIN I   Dg Chest 2 View  05/29/2013   *RADIOLOGY REPORT*  Clinical Data: Fall, CVA  CHEST - 2 VIEW  Comparison:  2012  Findings: Stable enlarged heart silhouette.  There is no effusion, infiltrate, or pneumothorax. Degenerative osteophytosis of the thoracic spine.  Osteopenia noted.  IMPRESSION: No acute cardiopulmonary process.  Osteopenia   Original Report Authenticated By: Genevive Bi, M.D.   Ct Head Wo Contrast  05/29/2013   *RADIOLOGY REPORT*  Clinical Data:  Fall.  Head trauma.  Laceration over the left orbit.  CT HEAD WITHOUT CONTRAST CT CERVICAL SPINE WITHOUT CONTRAST  Technique:  Multidetector CT imaging of the head and cervical spine was performed following the standard protocol without intravenous contrast.  Multiplanar CT image reconstructions of the cervical spine were also generated.  Comparison:   None  CT HEAD  Findings: No mass lesion, mass effect, midline shift, hydrocephalus, hemorrhage.  No acute territorial cortical ischemia/infarct. Atrophy and chronic ischemic white matter disease is present.  There is enlargement posterior fossa and relatively small cerebellum.  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  Intracranial atherosclerosis.  Calvarium intact.  There is no gas in the soft tissues.  There appears to be soft tissue swelling in the right supraorbital region and possibly over the left orbital rim.  No skull fracture.  Intracranial atherosclerosis.  IMPRESSION: No acute intracranial abnormality.  Atrophy, chronic ischemic white matter disease and chronic enlargement the posterior fossa likely representing either Dandy Walker variant or variant of mega cisterna magna.  CT CERVICAL SPINE  Findings: No cervical spine fracture or dislocation.  Ankylosis of C2-C3 facet joints bilaterally and the C3-C4 facet joints on the right.  Multilevel degenerative disc and facet disease.  Apparent degenerative ankylosis of C7 on T1 with 2 mm anterolisthesis. Carotid atherosclerosis is present.  IMPRESSION: No cervical spine fracture or dislocation.  Cervical  spondylosis and facet arthrosis.   Original Report Authenticated By: Andreas Newport, M.D.   Ct Cervical Spine Wo Contrast  05/29/2013   *RADIOLOGY REPORT*  Clinical Data:  Fall.  Head trauma.  Laceration over the left orbit.  CT HEAD WITHOUT CONTRAST CT CERVICAL SPINE WITHOUT CONTRAST  Technique:  Multidetector CT imaging of the head and cervical spine was performed following the standard protocol without intravenous contrast.  Multiplanar CT image reconstructions of the cervical spine were also generated.  Comparison:   None  CT HEAD  Findings: No mass lesion, mass effect, midline shift, hydrocephalus, hemorrhage.  No acute territorial cortical ischemia/infarct. Atrophy and chronic ischemic white matter disease is present.  There is enlargement posterior fossa and relatively small cerebellum.  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  Intracranial atherosclerosis.  Calvarium intact.  There is no gas in the soft tissues.  There appears to be soft tissue swelling in the right supraorbital region and possibly over the left orbital rim.  No skull fracture.  Intracranial atherosclerosis.  IMPRESSION: No acute intracranial abnormality.  Atrophy, chronic ischemic white matter disease and chronic enlargement the posterior fossa likely representing either Dandy Walker variant or variant of mega cisterna magna.  CT CERVICAL SPINE  Findings: No cervical spine fracture or dislocation.  Ankylosis of C2-C3 facet joints bilaterally and the C3-C4 facet joints on the right.  Multilevel degenerative disc and facet disease.  Apparent degenerative ankylosis of C7 on T1 with 2 mm anterolisthesis. Carotid atherosclerosis is present.  IMPRESSION: No cervical spine fracture or dislocation.  Cervical spondylosis and facet arthrosis.   Original Report Authenticated By: Andreas Newport, M.D.   1. Fall, initial encounter   2. Fever     MDM  Fall from wheelchair without loss of  consciousness. Abrasion to eyebrow. No dizziness, lightheadedness, chest pain or shortness of breath no syncope.  INR 1.6. No new focal deficits. Fever to 101 in the ED. CT head and C-spine without acute finding. CXR negative, UA negative.  On further questioning, patient states he's had a  sore throat for the past several days but able to tolerate by mouth. There is mild erythema to his oropharynx without asymmetry or exudate. Rapid strep is negative. Viral pharyngitis as the likely source of his fever. He is nontoxic appearing and tolerating by mouth.   Date: 05/29/2013  Rate: 85  Rhythm: normal sinus rhythm  QRS Axis: normal  Intervals: normal  ST/T Wave abnormalities: nonspecific ST/T changes  Conduction Disutrbances:none  Narrative Interpretation:   Old EKG Reviewed: unchanged    Glynn Octave, MD 05/29/13 2119

## 2013-05-29 NOTE — ED Notes (Signed)
Pt is from [facility], and the facility would like to care for him in their skilled unit this pm if pt is d/c from the ED.  Please inform [facility] that pt needs to go [facility] nursing unit this pm.

## 2013-05-29 NOTE — ED Notes (Signed)
Trying to move from wheelchair to recliner, fell on hands and knees. Was on floor for about 1 hour before staff found him. No complaints, denies injury and pain.

## 2013-05-31 ENCOUNTER — Telehealth: Payer: Self-pay | Admitting: Internal Medicine

## 2013-05-31 ENCOUNTER — Other Ambulatory Visit: Payer: Self-pay | Admitting: Internal Medicine

## 2013-05-31 LAB — CULTURE, GROUP A STREP

## 2013-05-31 NOTE — Telephone Encounter (Signed)
Ok, will forward to MW so he is aware

## 2013-05-31 NOTE — Telephone Encounter (Signed)
Will forward to leslie as an [place]

## 2013-06-01 ENCOUNTER — Telehealth: Payer: Self-pay | Admitting: Internal Medicine

## 2013-06-01 NOTE — Telephone Encounter (Signed)
Forms in MW lookat to be completed

## 2013-06-04 NOTE — Telephone Encounter (Signed)
Please advise Dr. Wert thanks 

## 2013-06-04 NOTE — Telephone Encounter (Signed)
done

## 2013-06-04 NOTE — Telephone Encounter (Signed)
Have papers been completed yet  [phone] switchboard

## 2013-06-12 ENCOUNTER — Ambulatory Visit (INDEPENDENT_AMBULATORY_CARE_PROVIDER_SITE_OTHER): Payer: Medicare Other | Admitting: Cardiology

## 2013-06-12 DIAGNOSIS — Z7901 Long term (current) use of anticoagulants: Secondary | ICD-10-CM

## 2013-06-12 DIAGNOSIS — I6350 Cerebral infarction due to unspecified occlusion or stenosis of unspecified cerebral artery: Secondary | ICD-10-CM

## 2013-06-12 DIAGNOSIS — I639 Cerebral infarction, unspecified: Secondary | ICD-10-CM

## 2013-06-12 LAB — POCT INR: INR: 2.4

## 2013-06-13 ENCOUNTER — Telehealth: Payer: Self-pay

## 2013-06-13 NOTE — Telephone Encounter (Signed)
Follow Up      Following up on a INR report that was faxed over. Please call.

## 2013-06-13 NOTE — Telephone Encounter (Signed)
Pt is now residing in assisted living, they are now administering pt's medications.  Advised to have pt continue on same dosage of Coumadin and recheck in 3 weeks.  See anticoagulation note in EPIC.

## 2013-06-14 ENCOUNTER — Telehealth: Payer: Self-pay | Admitting: Internal Medicine

## 2013-06-14 NOTE — Telephone Encounter (Signed)
Called and spoke with Nurse at [facility] She states that the pt is having nausea, vomiting, and fever 102 since around 3:30 pm today They gave him tylenol and will check temp again soon Requesting recs from MW Please advise, thanks!

## 2013-06-14 NOTE — Telephone Encounter (Signed)
Can't treat over the phone > rec ER

## 2013-06-14 NOTE — Telephone Encounter (Signed)
I spoke with pt nurse Ameen and advised of recs. [name], CMA

## 2013-06-15 ENCOUNTER — Telehealth: Payer: Self-pay | Admitting: Internal Medicine

## 2013-06-15 NOTE — Telephone Encounter (Signed)
Form in MW lookat to be signed   

## 2013-06-19 NOTE — Telephone Encounter (Signed)
Please advise if this has been taken care of? thanks 

## 2013-06-21 NOTE — Telephone Encounter (Signed)
Yes and nurse has already come to pick this up

## 2013-06-22 ENCOUNTER — Telehealth: Payer: Self-pay | Admitting: Internal Medicine

## 2013-06-22 NOTE — Telephone Encounter (Signed)
Anything we have had on him this wk has been signed and faxed back already I do not have any more forms on this pt for MW to sign LMTCB for [name] to have her re-fax

## 2013-06-22 NOTE — Telephone Encounter (Signed)
[name] faxed a form for PT that needs to be signed to [facility]. I will forward message to [name] to see if she has seen this form. [name], CMA

## 2013-06-26 ENCOUNTER — Ambulatory Visit (INDEPENDENT_AMBULATORY_CARE_PROVIDER_SITE_OTHER): Payer: Medicare Other | Admitting: Cardiology

## 2013-06-26 DIAGNOSIS — I6350 Cerebral infarction due to unspecified occlusion or stenosis of unspecified cerebral artery: Secondary | ICD-10-CM

## 2013-06-26 DIAGNOSIS — Z7901 Long term (current) use of anticoagulants: Secondary | ICD-10-CM

## 2013-06-26 DIAGNOSIS — I639 Cerebral infarction, unspecified: Secondary | ICD-10-CM

## 2013-06-26 LAB — PROTIME-INR: INR: 2.4 — AB (ref 0.9–1.1)

## 2013-06-26 NOTE — Telephone Encounter (Signed)
Spoke with nurse as [name] was not there today-states they received all information but one clarification order; they have re-faxed that paper with cover sheet to MW attn yesterday (Monday 06-25-13); will forward to [place] to look out for this paper.

## 2013-06-26 NOTE — Telephone Encounter (Signed)
MW signed the form and I have faxed back

## 2013-06-28 ENCOUNTER — Encounter (HOSPITAL_COMMUNITY): Payer: Self-pay | Admitting: *Deleted

## 2013-06-28 ENCOUNTER — Telehealth: Payer: Self-pay | Admitting: Pulmonary Disease

## 2013-06-28 ENCOUNTER — Inpatient Hospital Stay (HOSPITAL_COMMUNITY)
Admission: EM | Admit: 2013-06-28 | Discharge: 2013-07-05 | DRG: 444 | Disposition: A | Discharge status: Skilled Nursing Facility | Payer: Medicare Other | Attending: Internal Medicine | Admitting: Internal Medicine

## 2013-06-28 ENCOUNTER — Emergency Department (HOSPITAL_COMMUNITY): Payer: Medicare Other

## 2013-06-28 DIAGNOSIS — I4729 Other ventricular tachycardia: Secondary | ICD-10-CM

## 2013-06-28 DIAGNOSIS — Z6827 Body mass index (BMI) 27.0-27.9, adult: Secondary | ICD-10-CM

## 2013-06-28 DIAGNOSIS — R7989 Other specified abnormal findings of blood chemistry: Secondary | ICD-10-CM | POA: Diagnosis present

## 2013-06-28 DIAGNOSIS — D72829 Elevated white blood cell count, unspecified: Secondary | ICD-10-CM

## 2013-06-28 DIAGNOSIS — Z85820 Personal history of malignant melanoma of skin: Secondary | ICD-10-CM

## 2013-06-28 DIAGNOSIS — I69359 Hemiplegia and hemiparesis following cerebral infarction affecting unspecified side: Secondary | ICD-10-CM | POA: Diagnosis present

## 2013-06-28 DIAGNOSIS — R791 Abnormal coagulation profile: Secondary | ICD-10-CM | POA: Diagnosis present

## 2013-06-28 DIAGNOSIS — Z7901 Long term (current) use of anticoagulants: Secondary | ICD-10-CM

## 2013-06-28 DIAGNOSIS — I472 Ventricular tachycardia, unspecified: Secondary | ICD-10-CM | POA: Diagnosis not present

## 2013-06-28 DIAGNOSIS — Z8673 Personal history of transient ischemic attack (TIA), and cerebral infarction without residual deficits: Secondary | ICD-10-CM

## 2013-06-28 DIAGNOSIS — K8309 Other cholangitis: Secondary | ICD-10-CM | POA: Diagnosis present

## 2013-06-28 DIAGNOSIS — R748 Abnormal levels of other serum enzymes: Secondary | ICD-10-CM

## 2013-06-28 DIAGNOSIS — Z87891 Personal history of nicotine dependence: Secondary | ICD-10-CM

## 2013-06-28 DIAGNOSIS — R609 Edema, unspecified: Secondary | ICD-10-CM

## 2013-06-28 DIAGNOSIS — R509 Fever, unspecified: Secondary | ICD-10-CM

## 2013-06-28 DIAGNOSIS — K8051 Calculus of bile duct without cholangitis or cholecystitis with obstruction: Principal | ICD-10-CM

## 2013-06-28 DIAGNOSIS — R269 Unspecified abnormalities of gait and mobility: Secondary | ICD-10-CM

## 2013-06-28 DIAGNOSIS — K838 Other specified diseases of biliary tract: Secondary | ICD-10-CM

## 2013-06-28 DIAGNOSIS — I498 Other specified cardiac arrhythmias: Secondary | ICD-10-CM | POA: Diagnosis present

## 2013-06-28 DIAGNOSIS — Z66 Do not resuscitate: Secondary | ICD-10-CM | POA: Diagnosis present

## 2013-06-28 DIAGNOSIS — R931 Abnormal findings on diagnostic imaging of heart and coronary circulation: Secondary | ICD-10-CM

## 2013-06-28 DIAGNOSIS — J449 Chronic obstructive pulmonary disease, unspecified: Secondary | ICD-10-CM | POA: Diagnosis present

## 2013-06-28 DIAGNOSIS — T45515A Adverse effect of anticoagulants, initial encounter: Secondary | ICD-10-CM | POA: Diagnosis present

## 2013-06-28 DIAGNOSIS — D689 Coagulation defect, unspecified: Secondary | ICD-10-CM

## 2013-06-28 DIAGNOSIS — H9190 Unspecified hearing loss, unspecified ear: Secondary | ICD-10-CM | POA: Diagnosis present

## 2013-06-28 DIAGNOSIS — D649 Anemia, unspecified: Secondary | ICD-10-CM | POA: Diagnosis present

## 2013-06-28 DIAGNOSIS — D518 Other vitamin B12 deficiency anemias: Secondary | ICD-10-CM

## 2013-06-28 DIAGNOSIS — I10 Essential (primary) hypertension: Secondary | ICD-10-CM

## 2013-06-28 DIAGNOSIS — I739 Peripheral vascular disease, unspecified: Secondary | ICD-10-CM

## 2013-06-28 DIAGNOSIS — K5730 Diverticulosis of large intestine without perforation or abscess without bleeding: Secondary | ICD-10-CM

## 2013-06-28 DIAGNOSIS — I679 Cerebrovascular disease, unspecified: Secondary | ICD-10-CM

## 2013-06-28 DIAGNOSIS — K219 Gastro-esophageal reflux disease without esophagitis: Secondary | ICD-10-CM

## 2013-06-28 DIAGNOSIS — R5381 Other malaise: Secondary | ICD-10-CM | POA: Diagnosis present

## 2013-06-28 DIAGNOSIS — I4891 Unspecified atrial fibrillation: Secondary | ICD-10-CM

## 2013-06-28 DIAGNOSIS — M25569 Pain in unspecified knee: Secondary | ICD-10-CM

## 2013-06-28 DIAGNOSIS — H5440 Blindness, one eye, unspecified eye: Secondary | ICD-10-CM

## 2013-06-28 DIAGNOSIS — I495 Sick sinus syndrome: Secondary | ICD-10-CM

## 2013-06-28 DIAGNOSIS — N62 Hypertrophy of breast: Secondary | ICD-10-CM

## 2013-06-28 DIAGNOSIS — E785 Hyperlipidemia, unspecified: Secondary | ICD-10-CM

## 2013-06-28 DIAGNOSIS — E43 Unspecified severe protein-calorie malnutrition: Secondary | ICD-10-CM

## 2013-06-28 DIAGNOSIS — G4733 Obstructive sleep apnea (adult) (pediatric): Secondary | ICD-10-CM | POA: Diagnosis present

## 2013-06-28 DIAGNOSIS — K449 Diaphragmatic hernia without obstruction or gangrene: Secondary | ICD-10-CM | POA: Diagnosis present

## 2013-06-28 DIAGNOSIS — J4489 Other specified chronic obstructive pulmonary disease: Secondary | ICD-10-CM | POA: Diagnosis present

## 2013-06-28 DIAGNOSIS — R112 Nausea with vomiting, unspecified: Secondary | ICD-10-CM | POA: Diagnosis present

## 2013-06-28 DIAGNOSIS — I639 Cerebral infarction, unspecified: Secondary | ICD-10-CM

## 2013-06-28 DIAGNOSIS — R7401 Elevation of levels of liver transaminase levels: Secondary | ICD-10-CM

## 2013-06-28 HISTORY — DX: Calculus of bile duct without cholangitis or cholecystitis with obstruction: K80.51

## 2013-06-28 LAB — URINALYSIS, ROUTINE W REFLEX MICROSCOPIC
Glucose, UA: NEGATIVE mg/dL
Hgb urine dipstick: NEGATIVE
Ketones, ur: NEGATIVE mg/dL
Leukocytes, UA: NEGATIVE
Nitrite: NEGATIVE
Protein, ur: NEGATIVE mg/dL
Specific Gravity, Urine: 1.029 (ref 1.005–1.030)
Urobilinogen, UA: 1 mg/dL (ref 0.0–1.0)
pH: 5 (ref 5.0–8.0)

## 2013-06-28 LAB — BASIC METABOLIC PANEL
BUN: 15 mg/dL (ref 6–23)
CO2: 25 mEq/L (ref 19–32)
GFR calc non Af Amer: 70 mL/min — ABNORMAL LOW (ref >90)
Glucose, Bld: 111 mg/dL — ABNORMAL HIGH (ref 70–99)
Potassium: 3.6 mEq/L (ref 3.5–5.1)

## 2013-06-28 LAB — CBC WITH DIFFERENTIAL/PLATELET
Basophils Absolute: 0 10*3/uL (ref 0.0–0.1)
Eosinophils Relative: 0 % (ref 0–5)
Lymphocytes Relative: 3 % — ABNORMAL LOW (ref 12–46)
Lymphs Abs: 0.7 10*3/uL (ref 0.7–4.0)
Neutrophils Relative %: 93 % — ABNORMAL HIGH (ref 43–77)
Platelets: 345 10*3/uL (ref 150–400)
RBC: 3.85 MIL/uL — ABNORMAL LOW (ref 4.22–5.81)
RDW: 14.4 % (ref 11.5–15.5)
WBC: 24.5 10*3/uL — ABNORMAL HIGH (ref 4.0–10.5)

## 2013-06-28 LAB — PROTIME-INR: Prothrombin Time: 26.8 seconds — ABNORMAL HIGH (ref 11.6–15.2)

## 2013-06-28 LAB — HEPATIC FUNCTION PANEL
ALT: 52 U/L (ref 0–53)
AST: 60 U/L — ABNORMAL HIGH (ref 0–37)
Alkaline Phosphatase: 866 U/L — ABNORMAL HIGH (ref 39–117)
Bilirubin, Direct: 0.7 mg/dL — ABNORMAL HIGH (ref 0.0–0.3)
Total Bilirubin: 1.1 mg/dL (ref 0.3–1.2)

## 2013-06-28 MED ORDER — IBUPROFEN 200 MG PO TABS
200.0000 mg | ORAL_TABLET | Freq: Once | ORAL | Status: AC
  Administered 2013-06-29: 200 mg via ORAL
  Filled 2013-06-28: qty 1

## 2013-06-28 MED ORDER — SODIUM CHLORIDE 0.9 % IV SOLN: Freq: Once | INTRAVENOUS | Status: DC

## 2013-06-28 MED ORDER — SODIUM CHLORIDE 0.9 % IV SOLN
Freq: Once | INTRAVENOUS | Status: AC
  Administered 2013-06-28: 21:00:00 via INTRAVENOUS

## 2013-06-28 MED ORDER — CIPROFLOXACIN IN D5W 400 MG/200ML IV SOLN: 400.0000 mg | Freq: Once | INTRAVENOUS | Status: DC

## 2013-06-28 MED ORDER — SODIUM CHLORIDE 0.9 % IV SOLN
3.0000 g | Freq: Once | INTRAVENOUS | Status: AC
  Administered 2013-06-28: 3 g via INTRAVENOUS
  Filled 2013-06-28: qty 3

## 2013-06-28 MED ORDER — METRONIDAZOLE IN NACL 5-0.79 MG/ML-% IV SOLN: 500.0000 mg | Freq: Once | INTRAVENOUS | Status: DC

## 2013-06-28 NOTE — ED Notes (Signed)
US at bedside

## 2013-06-28 NOTE — ED Provider Notes (Signed)
Patient with vomiting this afternoon. Noted to be febrile. Presently asymptomatic. Eye exam alert no distress lungs clear auscultation abdomen soft nontender  Doug Sou, MD 06/28/13 2243

## 2013-06-28 NOTE — Progress Notes (Signed)
CSW met with pt at bedside.  Pt confirmed that he is a resident at resident at  [facility] and plans to return there when medically stable.  Pt shared that he has a son, Rod who would be coming tomorrow who is his HCPOA.   Pt thanked CSW for support and concern.    Marva Panda, LCSWA  [phone] 06/28/13 9:15 pm

## 2013-06-28 NOTE — ED Notes (Signed)
Pt aware of need for urine  

## 2013-06-28 NOTE — ED Notes (Signed)
Per EMS pt coming from [facility], nursing home with c/o fever since this afternoon. Staff at facility reported fever of 103.9, tylenol given at 1715.

## 2013-06-28 NOTE — ED Notes (Signed)
[id number]<AV> Expected date:<BR> Expected time:<BR> Means of arrival:<BR> Comments:<BR> 91-fever

## 2013-06-28 NOTE — ED Provider Notes (Signed)
CSN: [id number]     Arrival date & time 06/28/13  1837 History     First MD Initiated Contact with Patient 06/28/13 1959     Chief Complaint  Patient presents with  . Fever   (Consider location/radiation/quality/duration/timing/severity/associated sxs/prior Treatment) HPI  [name] is a 79 y.o. male resident of a [facility] [place] [facility] complaining of fever onset this afternoon. MAXIMUM TEMPERATURE is 103.9. Patient is not endorsed feeling feverish or chilled. He denies cough, change in bowel or bladder habits, headache, cervicalgia, rash. He endorses a single episode of nonbloody, nonbilious, no coffee ground emesis yesterday.  Past Medical History  Diagnosis Date  . GERD (gastroesophageal reflux disease)   . Hypertension   . Hearing loss     wears hearing aid - right side  . Leg swelling   . Trouble swallowing   . Bruises easily     due to coumadin  . Weakness     difficulty walking  . Contact lens/glasses fitting   . Heart disease   . Stroke 1999  . Sleep apnea     uses cpap, pt does not know setting  . Cancer   . 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 Gait disturbance   . Dyslipidemia   . COPD (chronic obstructive pulmonary disease)   . Peripheral vascular disease   . History of melanoma   . Diverticulosis   . Abnormality of gait 05/16/2013   Past Surgical History  Procedure Laterality Date  . Excision of melanoma  2009    left leg  . Melanoma excision      many melanoma removed in past  . Melanoma excision  10/11/2011    Procedure: MELANOMA EXCISION;  Surgeon: Valarie Merino, MD;  Location: [facility] OR;  Service: General;  Laterality: Left;  EXCISION melanoma left leg with full thickness skin grafting from left lower abdomen.  . Joint replacement  2012    r fem head fx  . Lung benign area removed   1970's  . Hernia repair  1983  . Melanoma excision  05/16/2012    Procedure: MELANOMA EXCISION;  Surgeon: Valarie Merino, MD;  Location: [facility];  Service:  General;  Laterality: Left;  Nodule Removal of Melanoma on Left Shin   Family History  Problem Relation Age of Onset  . Heart disease Father   . Cancer Son     prostate  . Dementia Sister     One sister has dementia   History  Substance Use Topics  . Smoking status: Former Smoker    Types: Pipe    Quit date: 08/31/1964  . Smokeless tobacco: Never Used  . Alcohol Use: No    Review of Systems  Constitutional:       Negative except as described in HPI  HENT:       Negative except as described in HPI  Respiratory:       Negative except as described in HPI  Cardiovascular:       Negative except as described in HPI  Gastrointestinal:       Negative except as described in HPI  Genitourinary:       Negative except as described in HPI  Musculoskeletal:       Negative except as described in HPI  Skin:       Negative except as described in HPI  Neurological:       Negative except as described in HPI  All other systems reviewed and  are negative.    Allergies  Sulfonamide derivatives  Home Medications   Current Outpatient Rx  Name  Route  Sig  Dispense  Refill  . acetaminophen (TYLENOL) 325 MG tablet   Oral   Take 650 mg by mouth every 6 (six) hours as needed for pain.          . furosemide (LASIX) 20 MG tablet      20 mg daily.          . hydroxypropyl methylcellulose (ISOPTO TEARS) 2.5 % ophthalmic solution   Both Eyes   Place 1 drop into both eyes 4 (four) times daily as needed (eye irritation).          Marland Kitchen omeprazole (PRILOSEC) 20 MG capsule   Oral   Take 20 mg by mouth daily.         . ranitidine (ZANTAC) 150 MG tablet   Oral   Take 150 mg by mouth at bedtime.         Marland Kitchen warfarin (COUMADIN) 1 MG tablet   Oral   Take 3-4.5 mg by mouth as directed. Take 3 mg on Monday  Wednesday Thursday Friday and Sunday. Take 4.5 mg on Tuesday and Saturday         . meclizine (ANTIVERT) 25 MG tablet   Oral   Take 12.5-25 mg by mouth 3 (three) times daily as  needed for dizziness.           BP 120/61  Pulse 99  Temp(Src) 101.9 F (38.8 C) (Oral)  Resp 16  SpO2 93% Physical Exam  Nursing note and vitals reviewed. Constitutional: He is oriented to person, place, and time. He appears well-developed and well-nourished. No distress.  HENT:  Head: Normocephalic and atraumatic.  Mouth/Throat: Oropharynx is clear and moist.  Eyes: Conjunctivae and EOM are normal. Pupils are equal, round, and reactive to light.  Left eye corneal opacification  Neck: Normal range of motion.  Patient has full range of motion to neck. No tenderness to deep palpation of the posterior cervical spine. Patient can flex chin to chest with no pain.   Cardiovascular: Normal rate, regular rhythm and intact distal pulses.   Pulmonary/Chest: Effort normal and breath sounds normal. No stridor. No respiratory distress. He has no wheezes. He has no rales. He exhibits no tenderness.  Abdominal: Soft. Bowel sounds are normal. He exhibits no distension and no mass. There is no tenderness. There is no rebound and no guarding.  Musculoskeletal: Normal range of motion. He exhibits edema.  1+ pitting edema to right ankle. Left ankle is without edema  Neurological: He is alert and oriented to person, place, and time.  Chronic right-sided hemiparesis  Psychiatric: He has a normal mood and affect.    ED Course   Procedures (including critical care time)  Labs Reviewed  CBC WITH DIFFERENTIAL - Abnormal; Notable for the following:    WBC 24.5 (*)    RBC 3.85 (*)    Hemoglobin 11.2 (*)    HCT 34.3 (*)    Neutrophils Relative % 93 (*)    Neutro Abs 22.8 (*)    Lymphocytes Relative 3 (*)    All other components within normal limits  URINALYSIS, ROUTINE W REFLEX MICROSCOPIC - Abnormal; Notable for the following:    Color, Urine AMBER (*)    Bilirubin Urine SMALL (*)    All other components within normal limits  BASIC METABOLIC PANEL - Abnormal; Notable for the following:     Glucose,  Bld 111 (*)    Calcium 8.2 (*)    GFR calc non Af Amer 70 (*)    GFR calc Af Amer 81 (*)    All other components within normal limits  HEPATIC FUNCTION PANEL - Abnormal; Notable for the following:    Total Protein 5.8 (*)    Albumin 2.4 (*)    AST 60 (*)    Alkaline Phosphatase 866 (*)    Bilirubin, Direct 0.7 (*)    All other components within normal limits  CG4 I-STAT (LACTIC ACID)   Dg Chest 1 View  06/28/2013   *RADIOLOGY REPORT*  Clinical Data: Fever, COPD, hypertension  CHEST - 1 VIEW  Comparison: 05/29/2013; 10/15/2012l 05/13/2010; PET CT - 06/26/2008  Findings: Grossly unchanged enlarged cardiac silhouette and mediastinal contours with apparent accentuated ectasia of the thoracic aorta due to patient rotation to the right.  The lungs appear hyperexpanded with flattening of the bilateral hemidiaphragms.  No new focal airspace opacities.  No pleural effusion or pneumothorax.  There is persistent deformity involving the lateral aspect of the right chest wall.  IMPRESSION: Hyperexpanded lungs without acute cardiopulmonary disease.   Original Report Authenticated By: Tacey Ruiz, MD   US Abdomen Complete  06/28/2013   *RADIOLOGY REPORT*  Clinical Data:  Fever. Elevated white count.  Elevated LFTs.  COMPLETE ABDOMINAL ULTRASOUND  Comparison:  None available.  Findings:  Gallbladder:  1.5 cm mobile shadowing stone is present within the gallbladder.  Wall thickness is at the upper limits of normal at 3.6 mm.  There is no sonographic Murphy's sign.  Common bile duct:  The common bile duct is markedly dilated, measuring 17 mm.  No obstructing lesion or stone is identified.  Liver:  Moderate intrahepatic biliary dilation is present.  No focal mass lesion is present.  IVC:  Appears normal.  Pancreas:  Visualization of the pancreas is limited to the pancreatic head due to overlying bowel gas.  No obstructing lesion is identified.  Spleen:  Normal size and echotexture without focal parenchymal  abnormality.  The maximal length is 7.2 cm.  Right Kidney:  The renal cortex is thinned.  The kidney is hyperechoic.  No focal mass lesion or stone is present.  The maximal length is 10.8 cm, within normal limits.  Left Kidney:  The renal cortex is thinned.  The kidney is hyperechoic.  No focal mass lesion or stone is present.  There is no hydronephrosis.  The maximal length is 10.7 cm, within normal limits.  Abdominal aorta:  The distal aorta is obscured by gas.  The more proximal aorta is within normal limits.  IMPRESSION:  1.  Marked intra and extrahepatic biliary dilation.  No obstructing lesion or stone is identified.  The common bile duct is dilated into the pancreatic head. 2.  Bilateral hyperechoic kidneys with thinning of the parenchyma. This is compatible with nonspecific medical renal disease. 3.  Single large gallstone without evidence for acute cholecystitis.   Original Report Authenticated By: Marin Roberts, M.D.   1. Fever   2. Leukocytosis   3. Elevated alkaline phosphatase level   4. Elevated AST (SGOT)   5. Bilirubinemia     MDM   Filed Vitals:   06/28/13 1839 06/28/13 1927 06/28/13 2212 06/28/13 2230  BP:  120/61 106/45 107/49  Pulse:  99 90   Temp:  101.9 F (38.8 C) 99.7 F (37.6 C)   TempSrc:  Oral Oral   Resp:  16 18   SpO2: 92%  93% 94%      [name] is a 79 y.o. male coming in from nursing home, febrile, asymptomatic except for single episode of emesis yesterday. Didn't have a significant leukocytosis of 24.5. Lactic acid is normal at 1.55. Patient is minimally elevated AST at 60 however his alkaline phosphatase is 866. Direct bilirubin is elevated at 0.7. Abdominal ultrasound shows marked intra-and extrahepatic biliary dilation. No obstructing stone or lesion identified. CT abdomen pelvis ordered to evaluate the pancreas. Patient will be admitted to the hospitalist pending ERCP vs MRCP in the a.m. the CT is negative. Patient is started on Unasyn in the  ED.  Patient is anticoagulated with Coumadin,, INR is therapeutic.  Patient will be admitted to a telemetry bed under the care of [facility] hospitalist Dr. Mahala Menghini.   Medications  Ampicillin-Sulbactam (UNASYN) 3 g in sodium chloride 0.9 % 100 mL IVPB (3 g Intravenous New Bag/Given 06/28/13 2309)  0.9 %  sodium chloride infusion ( Intravenous New Bag/Given 06/28/13 2048)     Wynetta Emery, PA-C 06/29/13 1610

## 2013-06-28 NOTE — Telephone Encounter (Signed)
Spoke with caregiver, Boyd Kerbs, at [facility].  Pt noted to have high fever, nausea, and elevated blood pressure.  EMS called and pt sent to [facility] ER.    Will route to Dr. [name].

## 2013-06-28 NOTE — ED Notes (Signed)
Gave I stat CG4 to Dr. Ethelda Chick

## 2013-06-29 ENCOUNTER — Inpatient Hospital Stay (HOSPITAL_COMMUNITY): Payer: Medicare Other

## 2013-06-29 ENCOUNTER — Encounter (HOSPITAL_COMMUNITY): Payer: Self-pay | Admitting: Radiology

## 2013-06-29 ENCOUNTER — Emergency Department (HOSPITAL_COMMUNITY): Payer: Medicare Other

## 2013-06-29 DIAGNOSIS — R931 Abnormal findings on diagnostic imaging of heart and coronary circulation: Secondary | ICD-10-CM | POA: Diagnosis not present

## 2013-06-29 DIAGNOSIS — I359 Nonrheumatic aortic valve disorder, unspecified: Secondary | ICD-10-CM

## 2013-06-29 DIAGNOSIS — I4729 Other ventricular tachycardia: Secondary | ICD-10-CM | POA: Diagnosis not present

## 2013-06-29 DIAGNOSIS — K838 Other specified diseases of biliary tract: Secondary | ICD-10-CM

## 2013-06-29 DIAGNOSIS — I472 Ventricular tachycardia: Secondary | ICD-10-CM | POA: Diagnosis not present

## 2013-06-29 DIAGNOSIS — I4891 Unspecified atrial fibrillation: Secondary | ICD-10-CM

## 2013-06-29 DIAGNOSIS — E43 Unspecified severe protein-calorie malnutrition: Secondary | ICD-10-CM | POA: Insufficient documentation

## 2013-06-29 DIAGNOSIS — I498 Other specified cardiac arrhythmias: Secondary | ICD-10-CM

## 2013-06-29 LAB — COMPREHENSIVE METABOLIC PANEL
ALT: 55 U/L — ABNORMAL HIGH (ref 0–53)
Albumin: 2.2 g/dL — ABNORMAL LOW (ref 3.5–5.2)
Alkaline Phosphatase: 741 U/L — ABNORMAL HIGH (ref 39–117)
BUN: 16 mg/dL (ref 6–23)
Chloride: 98 mEq/L (ref 96–112)
Glucose, Bld: 119 mg/dL — ABNORMAL HIGH (ref 70–99)
Potassium: 4.1 mEq/L (ref 3.5–5.1)
Sodium: 134 mEq/L — ABNORMAL LOW (ref 135–145)
Total Bilirubin: 1.1 mg/dL (ref 0.3–1.2)
Total Protein: 5.4 g/dL — ABNORMAL LOW (ref 6.0–8.3)

## 2013-06-29 LAB — CBC
Hemoglobin: 10.7 g/dL — ABNORMAL LOW (ref 13.0–17.0)
MCH: 28.8 pg (ref 26.0–34.0)

## 2013-06-29 LAB — PROTIME-INR
INR: 2.76 — ABNORMAL HIGH (ref 0.00–1.49)
Prothrombin Time: 28.2 seconds — ABNORMAL HIGH (ref 11.6–15.2)

## 2013-06-29 MED ORDER — FUROSEMIDE 20 MG PO TABS
20.0000 mg | ORAL_TABLET | Freq: Every day | ORAL | Status: DC
  Administered 2013-06-30 – 2013-07-04 (×4): 20 mg via ORAL
  Filled 2013-06-29 (×6): qty 1

## 2013-06-29 MED ORDER — IOHEXOL 300 MG/ML  SOLN
100.0000 mL | Freq: Once | INTRAMUSCULAR | Status: AC | PRN
  Administered 2013-06-29: 100 mL via INTRAVENOUS

## 2013-06-29 MED ORDER — SODIUM CHLORIDE 0.9 % IV SOLN
INTRAVENOUS | Status: DC
  Administered 2013-06-29 – 2013-07-05 (×3): via INTRAVENOUS

## 2013-06-29 MED ORDER — ALBUTEROL SULFATE (5 MG/ML) 0.5% IN NEBU: 2.5000 mg | INHALATION_SOLUTION | RESPIRATORY_TRACT | Status: DC | PRN

## 2013-06-29 MED ORDER — HEPARIN SODIUM (PORCINE) 5000 UNIT/ML IJ SOLN
5000.0000 [IU] | Freq: Three times a day (TID) | INTRAMUSCULAR | Status: DC
  Administered 2013-06-29: 5000 [IU] via SUBCUTANEOUS
  Filled 2013-06-29 (×4): qty 1

## 2013-06-29 MED ORDER — SODIUM CHLORIDE 0.9 % IJ SOLN
3.0000 mL | Freq: Two times a day (BID) | INTRAMUSCULAR | Status: DC
  Administered 2013-06-30 – 2013-07-03 (×6): 3 mL via INTRAVENOUS

## 2013-06-29 MED ORDER — FAMOTIDINE 20 MG PO TABS
20.0000 mg | ORAL_TABLET | Freq: Every day | ORAL | Status: DC
  Administered 2013-06-29 – 2013-07-04 (×6): 20 mg via ORAL
  Filled 2013-06-29 (×8): qty 1

## 2013-06-29 MED ORDER — PANTOPRAZOLE SODIUM 40 MG PO TBEC
40.0000 mg | DELAYED_RELEASE_TABLET | Freq: Every day | ORAL | Status: DC
  Administered 2013-06-30 – 2013-07-05 (×6): 40 mg via ORAL
  Filled 2013-06-29 (×7): qty 1

## 2013-06-29 MED ORDER — MORPHINE SULFATE 2 MG/ML IJ SOLN: 1.0000 mg | INTRAMUSCULAR | Status: DC | PRN

## 2013-06-29 MED ORDER — ACETAMINOPHEN 325 MG PO TABS
650.0000 mg | ORAL_TABLET | Freq: Four times a day (QID) | ORAL | Status: DC | PRN
  Administered 2013-06-30: 650 mg via ORAL
  Filled 2013-06-29: qty 2

## 2013-06-29 MED ORDER — IOHEXOL 300 MG/ML  SOLN
50.0000 mL | Freq: Once | INTRAMUSCULAR | Status: AC | PRN
  Administered 2013-06-29: 50 mL via ORAL

## 2013-06-29 MED ORDER — HYDROMORPHONE HCL PF 1 MG/ML IJ SOLN: 1.0000 mg | INTRAMUSCULAR | Status: AC | PRN

## 2013-06-29 MED ORDER — SODIUM CHLORIDE 0.9 % IV SOLN
3.0000 g | Freq: Three times a day (TID) | INTRAVENOUS | Status: DC
  Administered 2013-06-29 – 2013-07-05 (×19): 3 g via INTRAVENOUS
  Filled 2013-06-29 (×22): qty 3

## 2013-06-29 MED ORDER — ONDANSETRON HCL 4 MG/2ML IJ SOLN: 4.0000 mg | Freq: Three times a day (TID) | INTRAMUSCULAR | Status: DC | PRN

## 2013-06-29 MED ORDER — POLYVINYL ALCOHOL 1.4 % OP SOLN
1.0000 [drp] | Freq: Four times a day (QID) | OPHTHALMIC | Status: DC | PRN
  Filled 2013-06-29: qty 15

## 2013-06-29 MED ORDER — MECLIZINE HCL 12.5 MG PO TABS
12.5000 mg | ORAL_TABLET | Freq: Three times a day (TID) | ORAL | Status: DC | PRN
  Filled 2013-06-29: qty 2

## 2013-06-29 MED ORDER — ONDANSETRON HCL 4 MG/2ML IJ SOLN: 4.0000 mg | Freq: Four times a day (QID) | INTRAMUSCULAR | Status: DC | PRN

## 2013-06-29 NOTE — Progress Notes (Addendum)
[facility] PROGRESS NOTE  [name] BIEGEL ZOX:[id number] DOB: [birth year] DOA: 06/28/2013 PCP: [name], MD  Brief narrative 79 year old male, with history of A. fib on Coumadin, malignant melanoma, right femoral neck fracture, embolic CVA, OSA, HTN, HL, mechanical fall on 05/29/13, was sent to the [facility] ED from [facility] on 06/28/13 with complaints of an episode of vomiting and fever of 103.42F. Patient also gave history of one-2 weeks of decreased appetite but no abdominal pain. In the ED, WBC 24.5, alkaline phosphatase 866 & abdominal ultrasound which showed marked intra-and extrahepatic biliary dietitian without obstructing stone or lesion. He was admitted for further evaluation and management.   Assessment/Plan: 1. Biliary obstruction secondary to choledocholithiasis/Dilated CBD/vomiting x1/mildly abnormal LFTs (elevated alkaline phosphatase, AST & ALT): Followup CT abdomen 06/29/13 shows intra-and extrahepatic bile the dietitian but no cause of obstruction identified. Cholelithiasis without inflammatory changes. Patient asymptomatic of abdominal pain and no further vomiting. Asking for food. [facility] consulted for assistance in evaluation and management. GI requesting MRI abdomen/MRCP to further evaluate bile ducts. Continue empiric IV Unasyn given presentation with fever and leukocytosis-? Cholangitis-however does not appear toxic or septic. MRI/MRCP results appreciated-discussed with GI who plan elective ERCP on Monday unless patient decompensates over the weekend. Continue antibiotics. 2. Leukocytosis: Possibly secondary to problem #1. Continue empiric IV Unasyn for now. Blood cultures if he spikes temperature. 3. Anemia: Hemoglobin stable. Follow CBC in a.m. 4. History of A. fib/chronic Coumadin: INR: 2.76. Currently in sinus rhythm. Patient had a 7 beat wide QRS-PVC versus A. fib with aberrancy-does not look like NSVT. Does not seem to be on any rate  controlled medications at home. Monitor on telemetry. No echo noted in system-will request. Echo shows EF 55% & basal, inferior and posterior severe hypokinesis. [facility] cardiology consulted for assistance with management and preprocedure clearance. 5. History of embolic CVA: Therapeutically anticoagulated. Will defer decision regarding continuing versus discontinuing Coumadin to his outpatient him days. Will resume Coumadin when cleared by GI. 6. HTN: Controlled. 7. OSA: Not on CPAP.  Code Status: DO NOT RESUSCITATE Family Communication: Discussed with son [name] Disposition Plan: Return to ALF when stable.   Consultants:  [facility]  Procedures:  None  Antibiotics:  IV Unasyn 7/25 >   HPI/Subjective: Denies complaints. Requesting food. Denies dyspnea, chest pain or palpitations.  Objective: Filed Vitals:   06/29/13 0311 06/29/13 0545 06/29/13 0607 06/29/13 1221  BP: 109/68   116/56  Pulse: 95   56  Temp: 98 F (36.7 C) 98.5 F (36.9 C)  98.1 F (36.7 C)  TempSrc: Oral Oral  Oral  Resp: 16   18  Height: 5\' 2"  (1.575 m)     Weight: 67.6 kg (149 lb 0.5 oz)     SpO2: 96%  97% 99%    Intake/Output Summary (Last 24 hours) at 06/29/13 1311 Last data filed at 06/29/13 0700  Gross per 24 hour  Intake 289.25 ml  Output    200 ml  Net  89.25 ml   Filed Weights   06/29/13 0311  Weight: 67.6 kg (149 lb 0.5 oz)    Exam:   General exam: Comfortable. Elderly flail pleasant male. Hard of hearing. Hearing aid in right ear.  Respiratory system: Clear. No increased work of breathing.  Cardiovascular system: S1 & S2 heard, RRR. No JVD, murmurs, gallops, clicks or pedal edema. Telemetry: Sinus rhythm in the 60s. An episode of 7 beat wide complex QRS-? PVCs versus A. fib  with aberrancy.  Gastrointestinal system: Abdomen is nondistended, soft and nontender. Normal bowel sounds heard.  Central nervous system: Alert and oriented. No focal neurological  deficits.  Extremities: Symmetric 5 x 5 power.   Data Reviewed: Basic Metabolic Panel:  Recent Labs Lab 06/28/13 2114 06/29/13 0435  NA 136 134*  K 3.6 4.1  CL 100 98  CO2 25 27  GLUCOSE 111* 119*  BUN 15 16  CREATININE 0.97 1.04  CALCIUM 8.2* 8.0*   Liver Function Tests:  Recent Labs Lab 06/28/13 2114 06/29/13 0435  AST 60* 66*  ALT 52 55*  ALKPHOS 866* 741*  BILITOT 1.1 1.1  PROT 5.8* 5.4*  ALBUMIN 2.4* 2.2*    Recent Labs Lab 06/28/13 2114  LIPASE 18   No results found for this basename: AMMONIA,  in the last 168 hours CBC:  Recent Labs Lab 06/28/13 2114 06/29/13 0435  WBC 24.5* 24.5*  NEUTROABS 22.8*  --   HGB 11.2* 10.7*  HCT 34.3* 33.4*  MCV 89.1 89.8  PLT 345 388   Cardiac Enzymes: No results found for this basename: CKTOTAL, CKMB, CKMBINDEX, TROPONINI,  in the last 168 hours BNP (last 3 results) No results found for this basename: PROBNP,  in the last 8760 hours CBG: No results found for this basename: GLUCAP,  in the last 168 hours  Recent Results (from the past 240 hour(s))  MRSA PCR SCREENING     Status: None   Collection Time    06/29/13  3:09 AM      Result Value Range Status   MRSA by PCR NEGATIVE  NEGATIVE Final   Comment:            The GeneXpert MRSA Assay (FDA     approved for NASAL specimens     only), is one component of a     comprehensive MRSA colonization     surveillance program. It is not     intended to diagnose MRSA     infection nor to guide or     monitor treatment for     MRSA infections.     Studies: Dg Chest 1 View  06/28/2013   *RADIOLOGY REPORT*  Clinical Data: Fever, COPD, hypertension  CHEST - 1 VIEW  Comparison: 05/29/2013; 10/15/2012l 05/13/2010; PET CT - 06/26/2008  Findings: Grossly unchanged enlarged cardiac silhouette and mediastinal contours with apparent accentuated ectasia of the thoracic aorta due to patient rotation to the right.  The lungs appear hyperexpanded with flattening of the  bilateral hemidiaphragms.  No new focal airspace opacities.  No pleural effusion or pneumothorax.  There is persistent deformity involving the lateral aspect of the right chest wall.  IMPRESSION: Hyperexpanded lungs without acute cardiopulmonary disease.   Original Report Authenticated By: Tacey Ruiz, MD   US Abdomen Complete  06/28/2013   *RADIOLOGY REPORT*  Clinical Data:  Fever. Elevated white count.  Elevated LFTs.  COMPLETE ABDOMINAL ULTRASOUND  Comparison:  None available.  Findings:  Gallbladder:  1.5 cm mobile shadowing stone is present within the gallbladder.  Wall thickness is at the upper limits of normal at 3.6 mm.  There is no sonographic Murphy's sign.  Common bile duct:  The common bile duct is markedly dilated, measuring 17 mm.  No obstructing lesion or stone is identified.  Liver:  Moderate intrahepatic biliary dilation is present.  No focal mass lesion is present.  IVC:  Appears normal.  Pancreas:  Visualization of the pancreas is limited to the pancreatic head due  to overlying bowel gas.  No obstructing lesion is identified.  Spleen:  Normal size and echotexture without focal parenchymal abnormality.  The maximal length is 7.2 cm.  Right Kidney:  The renal cortex is thinned.  The kidney is hyperechoic.  No focal mass lesion or stone is present.  The maximal length is 10.8 cm, within normal limits.  Left Kidney:  The renal cortex is thinned.  The kidney is hyperechoic.  No focal mass lesion or stone is present.  There is no hydronephrosis.  The maximal length is 10.7 cm, within normal limits.  Abdominal aorta:  The distal aorta is obscured by gas.  The more proximal aorta is within normal limits.  IMPRESSION:  1.  Marked intra and extrahepatic biliary dilation.  No obstructing lesion or stone is identified.  The common bile duct is dilated into the pancreatic head. 2.  Bilateral hyperechoic kidneys with thinning of the parenchyma. This is compatible with nonspecific medical renal disease. 3.   Single large gallstone without evidence for acute cholecystitis.   Original Report Authenticated By: Marin Roberts, M.D.   Ct Abdomen Pelvis W Contrast  06/29/2013   *RADIOLOGY REPORT*  Clinical Data: Fever.  Bile duct dilatation on previous ultrasound.  CT ABDOMEN AND PELVIS WITH CONTRAST  Technique:  Multidetector CT imaging of the abdomen and pelvis was performed following the standard protocol during bolus administration of intravenous contrast.  Contrast: OMNIPAQUE IOHEXOL 300 MG/ML  SOLN  Comparison: Ultrasound abdomen 07/24/ 2014  Findings: Atelectasis in the lung bases.  Coronary artery calcifications.  There is diffuse intra and extrahepatic bile duct dilatation extending down to the pancreatic head region.  No visualized obstructing mass or stone is identified.  The pancreatic duct is not dilated and the pancreas is atrophic.  There is a stone in the gallbladder.  No gallbladder wall thickening or inflammatory change.  No focal liver lesions.  Spleen size is normal.  There is a right adrenal gland nodule measuring 1.7 cm diameter.  This is likely to represent an adenoma.  The kidneys are somewhat atrophic without focal mass lesion or hydronephrosis.  Calcification of the abdominal aorta without aneurysm.  Tortuous aorta.  Normal appearance of inferior vena cava.  No retroperitoneal lymphadenopathy.  The stomach and small bowel are not abnormally distended.  Stool filled colon without distension or wall thickening.  No free air or free fluid in the abdomen.  Pelvis:  Visualization of the low pelvis is limited due to streak artifact arising from right hip prosthesis.  The prostate gland appears enlarged at 5.2 x 4.5 cm diameter.  No apparent bladder wall thickening.  The rectosigmoid colon is decompressed without evidence of diverticulitis.  No free or loculated pelvic fluid collections.  Appendix is normal.  No significant pelvic lymphadenopathy.  Surgical clips in the left groin.   Degenerative changes in the lumbar spine.  No destructive bone lesions appreciated.  Degenerative disc disease at L3-4 and L4-5 levels.  IMPRESSION: Intra and extrahepatic bile duct dilatation is present.  No cause of obstruction is identified. An occult obstructing stone or mass should be excluded.  ERCP or MRCP recommended to assess for cause of obstruction.  Cholelithiasis without inflammatory change.  No evidence of bowel obstruction or inflammatory change.  Prostate gland enlargement.   Original Report Authenticated By: Burman Nieves, M.D.     Additional labs:   Scheduled Meds: . ampicillin-sulbactam (UNASYN) IV  3 g Intravenous Q8H  . sodium chloride  3 mL Intravenous Q12H  Continuous Infusions: . sodium chloride 50 mL/hr at 06/29/13 1224    Principal Problem:   Common bile duct dilation Active Problems:   HYPERLIPIDEMIA   BLINDNESS, ONE EYE   SINUS BRADYCARDIA   Personal history of malignant melanoma of skin   CVA (cerebral vascular accident)    Time spent: 45 minutes.    [facility]  [facility] Pager [phone].   If 8PM-8AM, please contact night-coverage at [email or web address], password [facility] 06/29/2013, 1:11 PM  LOS: 1 day

## 2013-06-29 NOTE — Progress Notes (Signed)
Clinical Social Work Department BRIEF PSYCHOSOCIAL ASSESSMENT 06/29/2013  Patient:  [name],[name]     Account Number:  [id number]     Admit date:  06/28/2013  Clinical Social Worker:  Orpah Greek  Date/Time:  06/29/2013 11:46 AM  Referred by:  Physician  Date Referred:  06/29/2013 Referred for  Other - See comment   Other Referral:   Admitted from: [facility]   Interview type:  Patient Other interview type:    PSYCHOSOCIAL DATA Living Status:  FACILITY Admitted from facility:  [facility] Level of care:  Assisted Living Primary support name:  Mayford Knife "Rod" Parkerson (son) ph#: [phone] Primary support relationship to patient:  CHILD, ADULT Degree of support available:   good    CURRENT CONCERNS Current Concerns  Post-Acute Placement   Other Concerns:    SOCIAL WORK ASSESSMENT / PLAN CSW spoke with patient re: discharge planning. Patient was admitted from [facility] and plans to return at discharge.   Assessment/plan status:  Information/Referral to [facility] Other assessment/ plan:   Information/referral to community resources:   CSW completed FL2 and sent information to ALF. CSW left message for Nedra Hai @ ALF to confirm that they will be able to take patient back when stable.    PATIENT'S/FAMILY'S RESPONSE TO PLAN OF CARE: Patient informed CSW that he had recently moved from Independent to Assisted living @ [facility] and plans to return to ALF @ discharge.       Unice Bailey, LCSW [facility] Clinical Social Worker cell #: [phone]

## 2013-06-29 NOTE — Progress Notes (Signed)
Spoke with pt regarding cpap.  Pt stated he wants to wait until tonight to go on cpap.  RN notified.

## 2013-06-29 NOTE — Progress Notes (Signed)
  Echocardiogram 2D Echocardiogram has been performed.  [name] 06/29/2013, 2:15 PM

## 2013-06-29 NOTE — Consult Note (Signed)
Referring Provider: No ref. provider found Primary Care Physician:  [name], MD Primary Gastroenterologist:  Dr. Russella Dar  Reason for Consultation:  Abnormal LFT"s and biliary dilitation  HPI: [name] is a 79 y.o. male known history of A. Fib (on coumadin), malignant melanoma, history of right femoral neck fracture, embolic CVA in 2000, OSA, hypertension, hyperlipidemia, recent mechanical fall 05/29/2013.  He presented from [facility] after the noted a fever at 103.9 and one episode of nonbilious, nonbloody vomiting.   States over the past one to 2 weeks his appetite has decreased and he has not been eating as much, but currently he is hungry and very thirsty.  Denies abdominal pain.  States that he has no dysphagia, odynophagia, or any other issues.  No specific recent weight loss.   He does have his gallbladder and has never had gallbladder issues in the past.   In the emergency room, evaluation revealed white count 24.5, ALP elevated at 866 but total bili, AST, and ALT normal (although they are increased from his baseline over the years).    Ultrasound abdomen 06/28/2013 = markedly intra and extrahepatic biliary dilatation. No obstructing stone or lesion.  Common bile dilated into the pancreatic head.  Single large gallstone without evidence for acute cholecystitis.  CT scan showed the following:  Intra and extrahepatic bile duct dilatation is present. No cause of obstruction is identified. An occult obstructing stone or mass  should be excluded. ERCP or MRCP recommended to assess for cause  of obstruction. Cholelithiasis without inflammatory change. No  evidence of bowel obstruction or inflammatory change. Prostate  gland enlargement.  He is on unasyn.  No definite source of fever or leukocytosis identified.   Past Medical History  Diagnosis Date  . GERD (gastroesophageal reflux disease)   . Hypertension   . Hearing loss     wears hearing aid - right side  . Leg swelling    . Trouble swallowing   . Bruises easily     due to coumadin  . Weakness     difficulty walking  . Contact lens/glasses fitting   . Heart disease   . Stroke 1999  . Sleep apnea     uses cpap, pt does not know setting  . Cancer   . 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 Gait disturbance   . Dyslipidemia   . COPD (chronic obstructive pulmonary disease)   . Peripheral vascular disease   . History of melanoma   . Diverticulosis   . Abnormality of gait 05/16/2013    Past Surgical History  Procedure Laterality Date  . Excision of melanoma  2009    left leg  . Melanoma excision      many melanoma removed in past  . Melanoma excision  10/11/2011    Procedure: MELANOMA EXCISION;  Surgeon: Valarie Merino, MD;  Location: [facility] OR;  Service: General;  Laterality: Left;  EXCISION melanoma left leg with full thickness skin grafting from left lower abdomen.  . Joint replacement  2012    r fem head fx  . Lung benign area removed   1970's  . Hernia repair  1983  . Melanoma excision  05/16/2012    Procedure: MELANOMA EXCISION;  Surgeon: Valarie Merino, MD;  Location: [facility];  Service: General;  Laterality: Left;  Nodule Removal of Melanoma on Left Shin    Prior to Admission medications   Medication Sig Start Date End Date Taking? Authorizing Provider  acetaminophen (TYLENOL) 325 MG  tablet Take 650 mg by mouth every 6 (six) hours as needed for pain.    Yes Historical Provider, MD  furosemide (LASIX) 20 MG tablet 20 mg daily.  03/18/13  Yes Nyoka Cowden, MD  hydroxypropyl methylcellulose (ISOPTO TEARS) 2.5 % ophthalmic solution Place 1 drop into both eyes 4 (four) times daily as needed (eye irritation).    Yes Historical Provider, MD  omeprazole (PRILOSEC) 20 MG capsule Take 20 mg by mouth daily.   Yes Historical Provider, MD  ranitidine (ZANTAC) 150 MG tablet Take 150 mg by mouth at bedtime.   Yes Historical Provider, MD  warfarin (COUMADIN) 1 MG tablet Take 3-4.5 mg by mouth as directed. Take 3 mg on  Monday  Wednesday Thursday Friday and Sunday. Take 4.5 mg on Tuesday and Saturday   Yes Historical Provider, MD  meclizine (ANTIVERT) 25 MG tablet Take 12.5-25 mg by mouth 3 (three) times daily as needed for dizziness.     Historical Provider, MD    Current Facility-Administered Medications  Medication Dose Route Frequency Provider Last Rate Last Dose  . 0.9 %  sodium chloride infusion   Intravenous Once [employer], PA-C      . 0.9 %  sodium chloride infusion   Intravenous Continuous Rhetta Mura, MD      . albuterol (PROVENTIL) (5 MG/ML) 0.5% nebulizer solution 2.5 mg  2.5 mg Nebulization Q2H PRN [employer], PA-C      . Ampicillin-Sulbactam (UNASYN) 3 g in sodium chloride 0.9 % 100 mL IVPB  3 g Intravenous Q8H Rhetta Mura, MD 100 mL/hr at 06/29/13 0557 3 g at 06/29/13 0557  . heparin injection 5,000 Units  5,000 Units Subcutaneous Q8H Rhetta Mura, MD   5,000 Units at 06/29/13 0556  . HYDROmorphone (DILAUDID) injection 1 mg  1 mg Intravenous Q4H PRN Nicole Pisciotta, PA-C      . meclizine (ANTIVERT) tablet 12.5-25 mg  12.5-25 mg Oral TID PRN Rhetta Mura, MD      . morphine 2 MG/ML injection 1 mg  1 mg Intravenous Q3H PRN Rhetta Mura, MD      . ondansetron (ZOFRAN) injection 4 mg  4 mg Intravenous Q8H PRN Nicole Pisciotta, PA-C      . polyvinyl alcohol (LIQUIFILM TEARS) 1.4 % ophthalmic solution 1 drop  1 drop Both Eyes QID PRN Rhetta Mura, MD      . sodium chloride 0.9 % injection 3 mL  3 mL Intravenous Q12H Rhetta Mura, MD        Allergies as of 06/28/2013 - Review Complete 06/28/2013  Allergen Reaction Noted  . Sulfonamide derivatives Rash     Family History  Problem Relation Age of Onset  . Heart disease Father   . Cancer Son     prostate  . Dementia Sister     One sister has dementia    History   Social History  . Marital Status: Widowed    Spouse Name: N/A    Number of Children: 2  . Years of Education: MBA    Occupational History  . Retired    Social History Main Topics  . Smoking status: Former Smoker    Types: Pipe    Quit date: 08/31/1964  . Smokeless tobacco: Never Used  . Alcohol Use: No  . Drug Use: No  . Sexually Active: No   Other Topics Concern  . Not on file   Social History Narrative  . No narrative on file    Review of Systems: Ten  point ROS is O/W negative except as mentioned in HPI.  Physical Exam: Vital signs in last 24 hours: Temp:  [98 F (36.7 C)-101.9 F (38.8 C)] 98.5 F (36.9 C) (07/25 0545) Pulse Rate:  [72-99] 95 (07/25 0311) Resp:  [15-18] 16 (07/25 0311) BP: (106-127)/(45-77) 109/68 mmHg (07/25 0311) SpO2:  [92 %-97 %] 97 % (07/25 0607) Weight:  [149 lb 0.5 oz (67.6 kg)] 149 lb 0.5 oz (67.6 kg) (07/25 0311) Last BM Date: 06/28/13 General:  Alert, Well-developed, well-nourished, pleasant and cooperative in NAD Head:  Normocephalic and atraumatic. Eyes:  Sclera clear, no icterus.  Conjunctiva pink. Ears:  HOH. Mouth:  No deformity or lesions.   Lungs:  Clear throughout to auscultation.  No wheezes, crackles, or rhonchi.  Heart:  Regular rate and rhythm; no murmurs, clicks, rubs,  or gallops. Abdomen:  Soft, nontender, BS active, nonpalp mass or hsm.   Rectal:  Deferred.  Msk:  Symmetrical without gross deformities. Pulses:  Normal pulses noted. Extremities:  Without clubbing or edema.  Contracture of right arm. Neurologic:  Alert and  oriented x4;  grossly normal neurologically. Skin:  Intact without significant lesions or rashes. Psych:  Alert and cooperative. Normal mood and affect.  Intake/Output from previous day: 07/24 0701 - 07/25 0700 In: 289.3 [I.V.:189.3; IV Piggyback:100] Out: 200 [Urine:200]  Lab Results:  Recent Labs  06/28/13 2114 06/29/13 0435  WBC 24.5* 24.5*  HGB 11.2* 10.7*  HCT 34.3* 33.4*  PLT 345 388   BMET  Recent Labs  06/28/13 2114 06/29/13 0435  NA 136 134*  K 3.6 4.1  CL 100 98  CO2 25 27   GLUCOSE 111* 119*  BUN 15 16  CREATININE 0.97 1.04  CALCIUM 8.2* 8.0*   LFT  Recent Labs  06/28/13 2114 06/29/13 0435  PROT 5.8* 5.4*  ALBUMIN 2.4* 2.2*  AST 60* 66*  ALT 52 55*  ALKPHOS 866* 741*  BILITOT 1.1 1.1  BILIDIR 0.7*  --   IBILI 0.4  --    PT/INR  Recent Labs  06/28/13 2114 06/29/13 0435  LABPROT 26.8* 28.2*  INR 2.58* 2.76*    Studies/Results: Dg Chest 1 View  06/28/2013   *RADIOLOGY REPORT*  Clinical Data: Fever, COPD, hypertension  CHEST - 1 VIEW  Comparison: 05/29/2013; 10/15/2012l 05/13/2010; PET CT - 06/26/2008  Findings: Grossly unchanged enlarged cardiac silhouette and mediastinal contours with apparent accentuated ectasia of the thoracic aorta due to patient rotation to the right.  The lungs appear hyperexpanded with flattening of the bilateral hemidiaphragms.  No new focal airspace opacities.  No pleural effusion or pneumothorax.  There is persistent deformity involving the lateral aspect of the right chest wall.  IMPRESSION: Hyperexpanded lungs without acute cardiopulmonary disease.   Original Report Authenticated By: Tacey Ruiz, MD   US Abdomen Complete  06/28/2013   *RADIOLOGY REPORT*  Clinical Data:  Fever. Elevated white count.  Elevated LFTs.  COMPLETE ABDOMINAL ULTRASOUND  Comparison:  None available.  Findings:  Gallbladder:  1.5 cm mobile shadowing stone is present within the gallbladder.  Wall thickness is at the upper limits of normal at 3.6 mm.  There is no sonographic Murphy's sign.  Common bile duct:  The common bile duct is markedly dilated, measuring 17 mm.  No obstructing lesion or stone is identified.  Liver:  Moderate intrahepatic biliary dilation is present.  No focal mass lesion is present.  IVC:  Appears normal.  Pancreas:  Visualization of the pancreas is limited to the pancreatic  head due to overlying bowel gas.  No obstructing lesion is identified.  Spleen:  Normal size and echotexture without focal parenchymal abnormality.  The  maximal length is 7.2 cm.  Right Kidney:  The renal cortex is thinned.  The kidney is hyperechoic.  No focal mass lesion or stone is present.  The maximal length is 10.8 cm, within normal limits.  Left Kidney:  The renal cortex is thinned.  The kidney is hyperechoic.  No focal mass lesion or stone is present.  There is no hydronephrosis.  The maximal length is 10.7 cm, within normal limits.  Abdominal aorta:  The distal aorta is obscured by gas.  The more proximal aorta is within normal limits.  IMPRESSION:  1.  Marked intra and extrahepatic biliary dilation.  No obstructing lesion or stone is identified.  The common bile duct is dilated into the pancreatic head. 2.  Bilateral hyperechoic kidneys with thinning of the parenchyma. This is compatible with nonspecific medical renal disease. 3.  Single large gallstone without evidence for acute cholecystitis.   Original Report Authenticated By: Marin Roberts, M.D.   Ct Abdomen Pelvis W Contrast  06/29/2013   *RADIOLOGY REPORT*  Clinical Data: Fever.  Bile duct dilatation on previous ultrasound.  CT ABDOMEN AND PELVIS WITH CONTRAST  Technique:  Multidetector CT imaging of the abdomen and pelvis was performed following the standard protocol during bolus administration of intravenous contrast.  Contrast: OMNIPAQUE IOHEXOL 300 MG/ML  SOLN  Comparison: Ultrasound abdomen 07/24/ 2014  Findings: Atelectasis in the lung bases.  Coronary artery calcifications.  There is diffuse intra and extrahepatic bile duct dilatation extending down to the pancreatic head region.  No visualized obstructing mass or stone is identified.  The pancreatic duct is not dilated and the pancreas is atrophic.  There is a stone in the gallbladder.  No gallbladder wall thickening or inflammatory change.  No focal liver lesions.  Spleen size is normal.  There is a right adrenal gland nodule measuring 1.7 cm diameter.  This is likely to represent an adenoma.  The kidneys are somewhat atrophic  without focal mass lesion or hydronephrosis.  Calcification of the abdominal aorta without aneurysm.  Tortuous aorta.  Normal appearance of inferior vena cava.  No retroperitoneal lymphadenopathy.  The stomach and small bowel are not abnormally distended.  Stool filled colon without distension or wall thickening.  No free air or free fluid in the abdomen.  Pelvis:  Visualization of the low pelvis is limited due to streak artifact arising from right hip prosthesis.  The prostate gland appears enlarged at 5.2 x 4.5 cm diameter.  No apparent bladder wall thickening.  The rectosigmoid colon is decompressed without evidence of diverticulitis.  No free or loculated pelvic fluid collections.  Appendix is normal.  No significant pelvic lymphadenopathy.  Surgical clips in the left groin.  Degenerative changes in the lumbar spine.  No destructive bone lesions appreciated.  Degenerative disc disease at L3-4 and L4-5 levels.  IMPRESSION: Intra and extrahepatic bile duct dilatation is present.  No cause of obstruction is identified. An occult obstructing stone or mass should be excluded.  ERCP or MRCP recommended to assess for cause of obstruction.  Cholelithiasis without inflammatory change.  No evidence of bowel obstruction or inflammatory change.  Prostate gland enlargement.   Original Report Authenticated By: Burman Nieves, M.D.    IMPRESSION:  -79 year old male who was brought to [facility] for an episode of vomiting and fever.  Has a leukocytosis as  well.  Placed on antibiotics.  ALP elevated and has biliary dilation on ultrasound and CT scan.  No stones or masses identified.  No source of infection identified.  ? Occult stone or mass.  Patient is not obstructed with normal bili.  Elevated ALP may be from other sources as well. -Atrial fibrillation:  On coumadin with coagulopathy.  PLAN: -Will check MRI abdomen/MRCP to further evaluate bile ducts. -Continue antibiotics.   ZEHR, JESSICA D.  06/29/2013, 10:43  AM  Pager number [phone]   [facility] Attending  I have also seen and assessed the patient and agree with the above note. I have reviewed CT scan images. Not clear if biliary findings are incidental or related to symptoms and signs. MRCP is first step - Dr. Loreta Ave to see patient tomorrow. If ERCP indicated will need INR below 2 at least and anesthesia assistance so will not be this weekend unless urgent.  Iva Boop, MD, [name] [phone] (pager) 06/29/2013 2:55 PM

## 2013-06-29 NOTE — Consult Note (Signed)
CARDIOLOGY CONSULT NOTE  Patient ID: [name] MRN: [id number] DOB/AGE: [birth year] 79 y.o.  Admit date: 06/28/2013 Primary Physician [name], MD Primary Cardiologist  Chief Complaint  Arrhythmia  HPI:  The patient has a history of atrial fib.  He presented with fever and vomiting with bile duct dilatation.  He is found to have multiple stones in the bile duct.    He was noted to have a 7 beat run of wide complex arrhythmia.  Echo demonstrated a well preserved EF.  However, there was an inferior and basal severe hypokinesis.  He has no history of CAD as far as he recalls and he does not see a cardiologist in our practice.  The patient denies any new symptoms such as chest discomfort, neck or arm discomfort. There has been no new shortness of breath, PND or orthopnea. There have been no reported palpitations, presyncope or syncope.  He is frail but doesn't report frequent falls.    Past Medical History  Diagnosis Date  . GERD (gastroesophageal reflux disease)   . Hypertension   . Hearing loss     wears hearing aid - right side  . Leg swelling   . Trouble swallowing   . Bruises easily     due to coumadin  . Weakness     difficulty walking  . Contact lens/glasses fitting   . Heart disease   . Stroke 1999  . Sleep apnea     uses cpap, pt does not know setting  . Cancer   . 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 Gait disturbance   . Dyslipidemia   . COPD (chronic obstructive pulmonary disease)   . Peripheral vascular disease   . History of melanoma   . Diverticulosis   . Abnormality of gait 05/16/2013    Past Surgical History  Procedure Laterality Date  . Excision of melanoma  2009    left leg  . Melanoma excision      many melanoma removed in past  . Melanoma excision  10/11/2011    Procedure: MELANOMA EXCISION;  Surgeon: Valarie Merino, MD;  Location: [facility] OR;  Service: General;  Laterality: Left;  EXCISION melanoma left leg with full thickness skin grafting from left lower  abdomen.  . Joint replacement  2012    r fem head fx  . Lung benign area removed   1970's  . Hernia repair  1983  . Melanoma excision  05/16/2012    Procedure: MELANOMA EXCISION;  Surgeon: Valarie Merino, MD;  Location: [facility];  Service: General;  Laterality: Left;  Nodule Removal of Melanoma on Left Shin    Allergies  Allergen Reactions  . Sulfonamide Derivatives Rash    REACTION: rash   Prescriptions prior to admission  Medication Sig Dispense Refill  . acetaminophen (TYLENOL) 325 MG tablet Take 650 mg by mouth every 6 (six) hours as needed for pain.       . furosemide (LASIX) 20 MG tablet 20 mg daily.       . hydroxypropyl methylcellulose (ISOPTO TEARS) 2.5 % ophthalmic solution Place 1 drop into both eyes 4 (four) times daily as needed (eye irritation).       Marland Kitchen omeprazole (PRILOSEC) 20 MG capsule Take 20 mg by mouth daily.      . ranitidine (ZANTAC) 150 MG tablet Take 150 mg by mouth at bedtime.      Marland Kitchen warfarin (COUMADIN) 1 MG tablet Take 3-4.5 mg by mouth as directed. Take 3 mg  on Monday  Wednesday Thursday Friday and Sunday. Take 4.5 mg on Tuesday and Saturday      . meclizine (ANTIVERT) 25 MG tablet Take 12.5-25 mg by mouth 3 (three) times daily as needed for dizziness.        Family History  Problem Relation Age of Onset  . Heart disease Father   . Cancer Son     prostate  . Dementia Sister     One sister has dementia    History   Social History  . Marital Status: Widowed    Spouse Name: N/A    Number of Children: 2  . Years of Education: MBA   Occupational History  . Retired    Social History Main Topics  . Smoking status: Former Smoker    Types: Pipe    Quit date: 08/31/1964  . Smokeless tobacco: Never Used  . Alcohol Use: No  . Drug Use: No  . Sexually Active: No   Other Topics Concern  . Not on file   Social History Narrative  . No narrative on file     ROS:  As stated in the HPI and negative for all other systems.  Physical Exam: Blood  pressure 126/69, pulse 57, temperature 98.1 F (36.7 C), temperature source Oral, resp. rate 18, height 5\' 2"  (1.575 m), weight 149 lb 0.5 oz (67.6 kg), SpO2 97.00%.  GENERAL:  Very frail appearing HEENT:  Blind left eye, fundi not visualized, oral mucosa unremarkable NECK:  No jugular venous distention, waveform within normal limits, carotid upstroke brisk and symmetric, no bruits, no thyromegaly LYMPHATICS:  No cervical, inguinal adenopathy LUNGS:  Clear to auscultation bilaterally BACK:  No CVA tenderness CHEST:  Unremarkable HEART:  PMI not displaced or sustained,S1 and S2 within normal limits, no S3, no S4, no clicks, no rubs, no murmurs ABD:  Flat, positive bowel sounds normal in frequency in pitch, no bruits, no rebound, no guarding, no midline pulsatile mass, no hepatomegaly, no splenomegaly EXT:  2 plus pulses throughout, no edema, no cyanosis no clubbing, severe muscle wasting SKIN:  No rashes no nodules NEURO:  Left right arm paresis.  PSYCH:  Cognitively intact though slightly intact  Labs: Lab Results  Component Value Date   BUN 16 06/29/2013   Lab Results  Component Value Date   CREATININE 1.04 06/29/2013   Lab Results  Component Value Date   NA 134* 06/29/2013   K 4.1 06/29/2013   CL 98 06/29/2013   CO2 27 06/29/2013   No results found for this basename: CKTOTAL, CKMB, CKMBINDEX, TROPONINI   Lab Results  Component Value Date   WBC 24.5* 06/29/2013   HGB 10.7* 06/29/2013   HCT 33.4* 06/29/2013   MCV 89.8 06/29/2013   PLT 388 06/29/2013    Lab Results  Component Value Date   ALT 55* 06/29/2013   AST 66* 06/29/2013   ALKPHOS 741* 06/29/2013   BILITOT 1.1 06/29/2013     Radiology:  CXR:  Hyperexpanded lungs without acute cardiopulmonary disease.   EKG:NSR, rate 81, axis WNL, intervals WNL, PACs, no acute ST T wave changes.    ASSESSMENT AND PLAN:   WIDE COMPLEX ARRHYTHMIA:   Probable VTach.  However, I would treat this conservatively.  He has bradycardia on  telemetry. I would not suggest adding beta blocker or calcium channel blocker.    ATRIAL FIB:  Given the previous stroke he would be high risk for future embolic events.  I agree with restarting anticoagulation when [facility]  with GI.    BRADYCARDIA:  As above  ABNORMAL ECHO:  Given the absence of symptoms and overall frailty I would not suggest further evaluation for possible CAD.    SignedRollene Rotunda 06/29/2013, 6:46 PM

## 2013-06-29 NOTE — Progress Notes (Signed)
INITIAL NUTRITION ASSESSMENT  Pt meets criteria for severe MALNUTRITION in the context of chronic illness as evidenced by <75% estimated energy intake with 6.8% weight loss in the past month.  DOCUMENTATION CODES Per approved criteria  -Severe malnutrition in the context of chronic illness   INTERVENTION: - Diet advancement per MD - Will continue to monitor  NUTRITION DIAGNOSIS: Inadequate oral intake related to inability to eat as evidenced by NPO.   Goal: Advance diet as tolerated to regular diet  Monitor:  Weights, labs, diet advancement  Reason for Assessment: Nutrition risk   79 y.o. male  Admitting Dx: Common bile duct dilation  ASSESSMENT: Pt from [facility] nursing home, admitted with fever, nausea, and elevated blood pressure. Noted pt started vomiting yesterday afternoon. Per H&P, pt with poor appetite for the past 1-2 weeks associated with poor intake, especially of meat. Per weight trend, pt's weight down 11 pounds in the past month. Noted pt with significantly elevated Alk phos, and AST/ALT. GI following and plans to get MRI abdomen/MRCP to evaluate bile ducts.   Met with pt who was very hard of hearing. Only stated that he wanted to eat today, started to fall back asleep when questions were asked.   Height: Ht Readings from Last 1 Encounters:  06/29/13 5\' 2"  (1.575 m)    Weight: Wt Readings from Last 1 Encounters:  06/29/13 149 lb 0.5 oz (67.6 kg)    Ideal Body Weight: 118 lb  % Ideal Body Weight: 126%  Wt Readings from Last 10 Encounters:  06/29/13 149 lb 0.5 oz (67.6 kg)  05/16/13 160 lb (72.576 kg)  01/31/13 168 lb (76.204 kg)  04/25/13 167 lb (75.751 kg)  12/25/12 166 lb (75.297 kg)  10/25/12 175 lb (79.379 kg)  09/25/12 166 lb (75.297 kg)  06/28/12 174 lb (78.926 kg)  06/02/12 170 lb (77.111 kg)  05/16/12 166 lb 7.2 oz (75.5 kg)    Usual Body Weight: 160-168 lb  % Usual Body Weight: 88-93%  BMI:  Body mass index is 27.25  kg/(m^2).  Estimated Nutritional Needs: Kcal: 1450-1650 Protein: 70-80g Fluid: 1.4-1.6L/day  Skin: Lateral leg abrasion, bilateral heel wound, medial buttocks wound  Diet Order: NPO  EDUCATION NEEDS: -No education needs identified at this time   Intake/Output Summary (Last 24 hours) at 06/29/13 1347 Last data filed at 06/29/13 0700  Gross per 24 hour  Intake 289.25 ml  Output    200 ml  Net  89.25 ml    Last BM: 7/24  Labs:   Recent Labs Lab 06/28/13 2114 06/29/13 0435  NA 136 134*  K 3.6 4.1  CL 100 98  CO2 25 27  BUN 15 16  CREATININE 0.97 1.04  CALCIUM 8.2* 8.0*  GLUCOSE 111* 119*    CBG (last 3)  No results found for this basename: GLUCAP,  in the last 72 hours  Scheduled Meds: . ampicillin-sulbactam (UNASYN) IV  3 g Intravenous Q8H  . famotidine  20 mg Oral QHS  . furosemide  20 mg Oral Daily  . pantoprazole  40 mg Oral Daily  . sodium chloride  3 mL Intravenous Q12H    Continuous Infusions: . sodium chloride 50 mL/hr at 06/29/13 1224    Past Medical History  Diagnosis Date  . GERD (gastroesophageal reflux disease)   . Hypertension   . Hearing loss     wears hearing aid - right side  . Leg swelling   . Trouble swallowing   . Bruises easily  due to coumadin  . Weakness     difficulty walking  . Contact lens/glasses fitting   . Heart disease   . Stroke 1999  . Sleep apnea     uses cpap, pt does not know setting  . Cancer   . 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 Gait disturbance   . Dyslipidemia   . COPD (chronic obstructive pulmonary disease)   . Peripheral vascular disease   . History of melanoma   . Diverticulosis   . Abnormality of gait 05/16/2013    Past Surgical History  Procedure Laterality Date  . Excision of melanoma  2009    left leg  . Melanoma excision      many melanoma removed in past  . Melanoma excision  10/11/2011    Procedure: MELANOMA EXCISION;  Surgeon: Valarie Merino, MD;  Location: [facility] OR;  Service: General;   Laterality: Left;  EXCISION melanoma left leg with full thickness skin grafting from left lower abdomen.  . Joint replacement  2012    r fem head fx  . Lung benign area removed   1970's  . Hernia repair  1983  . Melanoma excision  05/16/2012    Procedure: MELANOMA EXCISION;  Surgeon: Valarie Merino, MD;  Location: [facility] ORS;  Service: General;  Laterality: Left;  Nodule Removal of Melanoma on Left Frankey Shown MS, RD, LDN [phone] Pager [phone] After Hours Pager

## 2013-06-29 NOTE — H&P (Addendum)
[facility] History and Physical  [name]:[id number] DOB: [birth year] DOA: 06/28/2013  Referring physician: Lynetta Mare, ed pa PCP: [name], MD  Specialists: GI  Chief Complaint: n/v  HPI: [name] is a 79 y.o. male known history of A. fib, CVA, malignant melanoma, history of right femoral neck fracture, 531, 12, embolic CVA, OSA, hypertension, hyperlipidemia, recent mechanical fall 05/29/2013 who presented from [facility] this evening after noting fever 103.9, one episode of nonbilious, nonbloody vomiting. In the ED he feels fine and has no endorsement have any chills rigors cough, cold, sputum, dysuria, hematuria, melena, hematemesis blurred vision, double vision. Falls, weakness, slurred speech, alteration mental status. He is very pleasant and doesgive full history, although he does not know the details as to why he came over here. States over the past one to 2 weeks that his appetite has decreased and he has not been eating as much meters. He is not felt a feeling for it and has been eating mainly vegetables. States that he has no dysphagia, odynophagia, or any other issues.  No specific recent weight loss. He does have his gallbladder and has never had gallbladder issues in the past. He recalls potentially having colonoscopy in the past, but is not sure when or by whom  Emergency room evaluation revealed white count 24,000 p.m./creatinine 15 pressure 0.97, lactic acid 1.55, hemoglobin 11.2, INR 2.5 Ultrasound abdomen 06/28/2013 = markedly in trend extrahepatic biliary dilatation. No obstructing stone or lesion, and common bile dilated pancreatic head with bilateral hyperechoic kidneys, and think name of the parenchyma-single large gallstone without evidence for acute cholecystitis   Review of Systems: See above   Past Medical History  Diagnosis Date  . GERD (gastroesophageal reflux disease)   . Hypertension   . Hearing loss     wears hearing aid -  right side  . Leg swelling   . Trouble swallowing   . Bruises easily     due to coumadin  . Weakness     difficulty walking  . Contact lens/glasses fitting   . Heart disease   . Stroke 1999  . Sleep apnea     uses cpap, pt does not know setting  . Cancer   . 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 Gait disturbance   . Dyslipidemia   . COPD (chronic obstructive pulmonary disease)   . Peripheral vascular disease   . History of melanoma   . Diverticulosis   . Abnormality of gait 05/16/2013   Past Surgical History  Procedure Laterality Date  . Excision of melanoma  2009    left leg  . Melanoma excision      many melanoma removed in past  . Melanoma excision  10/11/2011    Procedure: MELANOMA EXCISION;  Surgeon: Valarie Merino, MD;  Location: [facility] OR;  Service: General;  Laterality: Left;  EXCISION melanoma left leg with full thickness skin grafting from left lower abdomen.  . Joint replacement  2012    r fem head fx  . Lung benign area removed   1970's  . Hernia repair  1983  . Melanoma excision  05/16/2012    Procedure: MELANOMA EXCISION;  Surgeon: Valarie Merino, MD;  Location: [facility]S;  Service: General;  Laterality: Left;  Nodule Removal of Melanoma on Left Shin   Social History:  reports that he quit smoking about 48 years ago. His smoking use included Pipe. He has never used smokeless tobacco. He reports that he does not  drink alcohol or use illicit drugs.  Allergies  Allergen Reactions  . Sulfonamide Derivatives Rash    REACTION: rash    Family History  Problem Relation Age of Onset  . Heart disease Father   . Cancer Son     prostate  . Dementia Sister     One sister has dementia    Prior to Admission medications   Medication Sig Start Date End Date Taking? Authorizing Provider  acetaminophen (TYLENOL) 325 MG tablet Take 650 mg by mouth every 6 (six) hours as needed for pain.    Yes Historical Provider, MD  furosemide (LASIX) 20 MG tablet 20 mg daily.  03/18/13  Yes Nyoka Cowden, MD  hydroxypropyl methylcellulose (ISOPTO TEARS) 2.5 % ophthalmic solution Place 1 drop into both eyes 4 (four) times daily as needed (eye irritation).    Yes Historical Provider, MD  omeprazole (PRILOSEC) 20 MG capsule Take 20 mg by mouth daily.   Yes Historical Provider, MD  ranitidine (ZANTAC) 150 MG tablet Take 150 mg by mouth at bedtime.   Yes Historical Provider, MD  warfarin (COUMADIN) 1 MG tablet Take 3-4.5 mg by mouth as directed. Take 3 mg on Monday  Wednesday Thursday Friday and Sunday. Take 4.5 mg on Tuesday and Saturday   Yes Historical Provider, MD  meclizine (ANTIVERT) 25 MG tablet Take 12.5-25 mg by mouth 3 (three) times daily as needed for dizziness.     Historical Provider, MD   Physical Exam: Filed Vitals:   06/28/13 1839 06/28/13 1927 06/28/13 2212 06/28/13 2230  BP:  120/61 106/45 107/49  Pulse:  99 90   Temp:  101.9 F (38.8 C) 99.7 F (37.6 C)   TempSrc:  Oral Oral   Resp:  16 18   SpO2: 92% 93% 94%      General:  Alert pleasant oriented, in NAD  Eyes:  EOMI, NCAT  ENT:  clear  Neck: soft/nt  Cardiovascular: s1 s2 no m/r/g  Respiratory: ctab  Abdomen: soft, no rebound, no n/v/cp  Skin: soft,nt  Musculoskeletal: ROM  Psychiatric: euthymicand very pleasant  Neurologic: contracture to R arm.  Power wnl  Labs on Admission:  Basic Metabolic Panel:  Recent Labs Lab 06/28/13 2114  NA 136  K 3.6  CL 100  CO2 25  GLUCOSE 111*  BUN 15  CREATININE 0.97  CALCIUM 8.2*   Liver Function Tests:  Recent Labs Lab 06/28/13 2114  AST 60*  ALT 52  ALKPHOS 866*  BILITOT 1.1  PROT 5.8*  ALBUMIN 2.4*   No results found for this basename: LIPASE, AMYLASE,  in the last 168 hours No results found for this basename: AMMONIA,  in the last 168 hours CBC:  Recent Labs Lab 06/28/13 2114  WBC 24.5*  NEUTROABS 22.8*  HGB 11.2*  HCT 34.3*  MCV 89.1  PLT 345   Cardiac Enzymes: No results found for this basename: CKTOTAL, CKMB, CKMBINDEX,  TROPONINI,  in the last 168 hours  BNP (last 3 results) No results found for this basename: PROBNP,  in the last 8760 hours CBG: No results found for this basename: GLUCAP,  in the last 168 hours  Radiological Exams on Admission: Dg Chest 1 View  06/28/2013   *RADIOLOGY REPORT*  Clinical Data: Fever, COPD, hypertension  CHEST - 1 VIEW  Comparison: 05/29/2013; 10/15/2012l 05/13/2010; PET CT - 06/26/2008  Findings: Grossly unchanged enlarged cardiac silhouette and mediastinal contours with apparent accentuated ectasia of the thoracic aorta due to patient rotation to  the right.  The lungs appear hyperexpanded with flattening of the bilateral hemidiaphragms.  No new focal airspace opacities.  No pleural effusion or pneumothorax.  There is persistent deformity involving the lateral aspect of the right chest wall.  IMPRESSION: Hyperexpanded lungs without acute cardiopulmonary disease.   Original Report Authenticated By: Tacey Ruiz, MD   US Abdomen Complete  06/28/2013   *RADIOLOGY REPORT*  Clinical Data:  Fever. Elevated white count.  Elevated LFTs.  COMPLETE ABDOMINAL ULTRASOUND  Comparison:  None available.  Findings:  Gallbladder:  1.5 cm mobile shadowing stone is present within the gallbladder.  Wall thickness is at the upper limits of normal at 3.6 mm.  There is no sonographic Murphy's sign.  Common bile duct:  The common bile duct is markedly dilated, measuring 17 mm.  No obstructing lesion or stone is identified.  Liver:  Moderate intrahepatic biliary dilation is present.  No focal mass lesion is present.  IVC:  Appears normal.  Pancreas:  Visualization of the pancreas is limited to the pancreatic head due to overlying bowel gas.  No obstructing lesion is identified.  Spleen:  Normal size and echotexture without focal parenchymal abnormality.  The maximal length is 7.2 cm.  Right Kidney:  The renal cortex is thinned.  The kidney is hyperechoic.  No focal mass lesion or stone is present.  The maximal  length is 10.8 cm, within normal limits.  Left Kidney:  The renal cortex is thinned.  The kidney is hyperechoic.  No focal mass lesion or stone is present.  There is no hydronephrosis.  The maximal length is 10.7 cm, within normal limits.  Abdominal aorta:  The distal aorta is obscured by gas.  The more proximal aorta is within normal limits.  IMPRESSION:  1.  Marked intra and extrahepatic biliary dilation.  No obstructing lesion or stone is identified.  The common bile duct is dilated into the pancreatic head. 2.  Bilateral hyperechoic kidneys with thinning of the parenchyma. This is compatible with nonspecific medical renal disease. 3.  Single large gallstone without evidence for acute cholecystitis.   Original Report Authenticated By: Marin Roberts, M.D.   3 EKG: Independently reviewed. No EKG perfromed  Assessment/Plan Principal Problem:   Common bile duct dilation Active Problems:   HYPERLIPIDEMIA   BLINDNESS, ONE EYE   SINUS BRADYCARDIA   Personal history of malignant melanoma of skin   CVA (cerebral vascular accident)   1. Likely Common bile duct dilatation-await CT scan abdomen that has been ordered to rule out other pathology. Please consult gastroenterology in the morning with this result in hand, as patient has an elevated INR probably will not be able to receive ERCP emergently-low suspicion for ascending cholangitis at this time. However, agree with continued Unasyn treatment-repeat LFTs CBC a.m. and keep n.p.o. Hold Coumadin and allowed to trend lower limit of therapeutic range [1.7] 2. History A. Fib,CHad2Vasc2 score= above 3 Coumadin candidate. However, this needs to be discussed with outpatient physician given recent history of fall 3. Per history. CVA-patient potentially will need as an vs. Coumadin-see above discussion. 4. History sinus bradycardia-get EKG stat for preop assesment in case he does need surgery vs. ERCP 5. H/o melanoma-currently stable. 6. OSA-not on  CPAP  Please call GI in am  Code Status: DNR  Family Communication: none at bedside  Disposition Plan: inpatient   Time spent: 86  Mahala Menghini [facility] Pager [phone]  If 7PM-7AM, please contact night-coverage [email or web address] Password [email or web address] 06/29/2013, 12:13 AM

## 2013-06-29 NOTE — Progress Notes (Signed)
Call received from radiology.  MRCP demonstrates the following:    1. Two obstructing stones within the common bile duct and a large  stone at the confluence of the cystic duct and the common hepatic  duct. There is significant biliary obstruction t proximal to the  stones.  2. Single gallstone within the gallbladder. Cystic duct is also  dilated.  3. Multiple cysts throughout the pancreas suggest chronic pancreatitis.  Spoke with Dr. Leone Payor.  Plan will be to perform ERCP when coagulation parameters are acceptable (INR 1.5 or less) and anesthesia/staff availability.  Continue IV antibiotics for now.  Message relayed to Dr. Waymon Amato regarding plan.   Procedure could be complicated due to age and findings on MRCP of obstructing stones. He may end up with a stent if cannot remove stones. This is one where it will be best to have all ducks lined up in a riow, i.e. Want to make sure things are ok and appropriate anesthesia available. This will best be done early next week. He is improved w/ Abx, and these stones are undoubtedly a chronic issue so no need for urgency w/ ERCP at present.  Iva Boop, MD, [facility] [phone] (pager) 06/29/2013 4:43 PM

## 2013-06-29 NOTE — ED Provider Notes (Signed)
Medical screening examination/treatment/procedure(s) were conducted as a shared visit with non-physician practitioner(s) and myself.  I personally evaluated the patient during the encounter  Doug Sou, MD 06/29/13 [phone]

## 2013-06-29 NOTE — ED Notes (Signed)
Patient transported to CT 

## 2013-06-30 DIAGNOSIS — I472 Ventricular tachycardia: Secondary | ICD-10-CM

## 2013-06-30 DIAGNOSIS — K8051 Calculus of bile duct without cholangitis or cholecystitis with obstruction: Principal | ICD-10-CM

## 2013-06-30 DIAGNOSIS — R509 Fever, unspecified: Secondary | ICD-10-CM

## 2013-06-30 DIAGNOSIS — D72829 Elevated white blood cell count, unspecified: Secondary | ICD-10-CM

## 2013-06-30 DIAGNOSIS — I4891 Unspecified atrial fibrillation: Secondary | ICD-10-CM

## 2013-06-30 LAB — COMPREHENSIVE METABOLIC PANEL
BUN: 16 mg/dL (ref 6–23)
CO2: 24 mEq/L (ref 19–32)
Chloride: 102 mEq/L (ref 96–112)
Creatinine, Ser: 0.86 mg/dL (ref 0.50–1.35)
GFR calc non Af Amer: 74 mL/min — ABNORMAL LOW (ref >90)
Total Bilirubin: 0.8 mg/dL (ref 0.3–1.2)

## 2013-06-30 LAB — PROTIME-INR
INR: 2.2 — ABNORMAL HIGH (ref 0.00–1.49)
Prothrombin Time: 23.7 seconds — ABNORMAL HIGH (ref 11.6–15.2)

## 2013-06-30 LAB — CBC
HCT: 33.3 % — ABNORMAL LOW (ref 39.0–52.0)
MCV: 90.2 fL (ref 78.0–100.0)
RBC: 3.69 MIL/uL — ABNORMAL LOW (ref 4.22–5.81)
RDW: 14.7 % (ref 11.5–15.5)
WBC: 9.1 10*3/uL (ref 4.0–10.5)

## 2013-06-30 NOTE — Plan of Care (Signed)
Problem: Phase I Progression Outcomes Goal: Voiding-avoid urinary catheter unless indicated Outcome: Not Met (add Reason) Due Urinary retention--started on Flomax of 7/25

## 2013-06-30 NOTE — Progress Notes (Signed)
[facility] PROGRESS NOTE  KAREN KINNARD RUE:[id number] DOB: September 30, 1919 DOA: 06/28/2013 PCP: [name], MD  Brief narrative 79 year old male, with history of A. fib on Coumadin, malignant melanoma, right femoral neck fracture, embolic CVA, OSA, HTN, HL, mechanical fall on 05/29/13, was sent to the [facility] ED [facility] on 06/28/13 with complaints of an episode of vomiting and fever of 103.12F. Patient also gave history of one-2 weeks of decreased appetite but no abdominal pain. In the ED, WBC 24.5, alkaline phosphatase 866 & abdominal ultrasound which showed marked intra-and extrahepatic biliary dietitian without obstructing stone or lesion. He was admitted for further evaluation and management.   Assessment/Plan: 1. Biliary obstruction secondary to choledocholithiasis/Dilated CBD/vomiting x1/mildly abnormal LFTs (elevated alkaline phosphatase, AST & ALT): Followup CT abdomen 06/29/13 shows intra-and extrahepatic bile the dietitian but no cause of obstruction identified. Cholelithiasis without inflammatory changes. Patient asymptomatic of abdominal pain and no further vomiting. [facility]  input appreciated-they plan to reevaluate on Monday to decide next step-? ERCP versus laparoscopic cholecystectomy with IOC. Continue empiric IV Unasyn given presentation with fever and leukocytosis-? Cholangitis-however does not appear toxic or septic.  2. Leukocytosis: Possibly secondary to problem #1. Continue empiric IV Unasyn for now. Blood cultures if he spikes temperature. Resolved.  3. Anemia: Hemoglobin stable.  4. History of A. fib/chronic Coumadin: INR: 2.2. Currently in sinus rhythm. Does not seem to be on any rate controlled medications at home. Monitor on telemetry. Echo shows EF 55% & basal, inferior and posterior severe hypokinesis. [facility]  input appreciated-no further workup due to lack of symptoms and overall frailty. 5. NSVT: Patient had a run of  7 beat NSVT on 7/25 at 12:10 PM. Echo results as above. Cardiology consultation appreciated. They recommend conservative treatment. They do not recommend beta blockers or calcium channel blockers due to sinus bradycardia on the monitor. 6. History of embolic CVA: Therapeutically anticoagulated. Will defer decision regarding continuing versus discontinuing Coumadin to his outpatient him days. Will resume Coumadin when cleared by GI. 7. HTN: Controlled. 8. OSA: Not on CPAP.  Code Status: DO NOT RESUSCITATE Family Communication: Discussed with son Mr. Lucian Baswell on 7/25.  Disposition Plan: Return to ALF when stable.   Consultants:  [facility]  Procedures:  None  Antibiotics:  IV Unasyn 7/25 >   HPI/Subjective: Denies complaints. Denies dyspnea, chest pain or palpitations.  Objective: Filed Vitals:   06/29/13 1524 06/29/13 2040 06/29/13 2111 06/30/13 0532  BP: 126/69 133/65  156/76  Pulse: 57 61  55  Temp: 98.1 F (36.7 C) 98.2 F (36.8 C)  98.2 F (36.8 C)  TempSrc: Oral Oral  Axillary  Resp: 18 18 18 19   Height:      Weight:      SpO2: 97% 97%  100%    Intake/Output Summary (Last 24 hours) at 06/30/13 1239 Last data filed at 06/30/13 0900  Gross per 24 hour  Intake   1208 ml  Output    225 ml  Net    983 ml   Filed Weights   06/29/13 0311  Weight: 67.6 kg (149 lb 0.5 oz)    Exam:   General exam: Comfortable. Elderly flail pleasant male. Hard of hearing. Hearing aid in right ear.  Respiratory system: Clear. No increased work of breathing.  Cardiovascular system: S1 & S2 heard, RRR. No JVD, murmurs, gallops, clicks or pedal edema. Telemetry: Sinus bradycardia in the mid 40s-50s. No further arrhythmia since 7/25.  Gastrointestinal system: Abdomen is nondistended, soft and nontender. Normal bowel sounds heard.  Central nervous system: Alert and oriented. No focal neurological deficits.  Extremities: Symmetric 5 x 5 power.   Data  Reviewed: Basic Metabolic Panel:  Recent Labs Lab 06/28/13 2114 06/29/13 0435 06/29/13 1820 06/30/13 0500  NA 136 134*  --  134*  K 3.6 4.1  --  4.2  CL 100 98  --  102  CO2 25 27  --  24  GLUCOSE 111* 119*  --  76  BUN 15 16  --  16  CREATININE 0.97 1.04  --  0.86  CALCIUM 8.2* 8.0*  --  8.1*  MG  --   --  1.8  --    Liver Function Tests:  Recent Labs Lab 06/28/13 2114 06/29/13 0435 06/30/13 0500  AST 60* 66* 48*  ALT 52 55* 44  ALKPHOS 866* 741* 647*  BILITOT 1.1 1.1 0.8  PROT 5.8* 5.4* 5.1*  ALBUMIN 2.4* 2.2* 1.9*    Recent Labs Lab 06/28/13 2114  LIPASE 18   No results found for this basename: AMMONIA,  in the last 168 hours CBC:  Recent Labs Lab 06/28/13 2114 06/29/13 0435 06/30/13 0500  WBC 24.5* 24.5* 9.1  NEUTROABS 22.8*  --   --   HGB 11.2* 10.7* 10.8*  HCT 34.3* 33.4* 33.3*  MCV 89.1 89.8 90.2  PLT 345 388 351   Cardiac Enzymes: No results found for this basename: CKTOTAL, CKMB, CKMBINDEX, TROPONINI,  in the last 168 hours BNP (last 3 results) No results found for this basename: PROBNP,  in the last 8760 hours CBG: No results found for this basename: GLUCAP,  in the last 168 hours  Recent Results (from the past 240 hour(s))  MRSA PCR SCREENING     Status: None   Collection Time    06/29/13  3:09 AM      Result Value Range Status   MRSA by PCR NEGATIVE  NEGATIVE Final   Comment:            The GeneXpert MRSA Assay (FDA     approved for NASAL specimens     only), is one component of a     comprehensive MRSA colonization     surveillance program. It is not     intended to diagnose MRSA     infection nor to guide or     monitor treatment for     MRSA infections.     Studies: Dg Chest 1 View  06/28/2013   *RADIOLOGY REPORT*  Clinical Data: Fever, COPD, hypertension  CHEST - 1 VIEW  Comparison: 05/29/2013; 10/15/2012l 05/13/2010; PET CT - 06/26/2008  Findings: Grossly unchanged enlarged cardiac silhouette and mediastinal contours  with apparent accentuated ectasia of the thoracic aorta due to patient rotation to the right.  The lungs appear hyperexpanded with flattening of the bilateral hemidiaphragms.  No new focal airspace opacities.  No pleural effusion or pneumothorax.  There is persistent deformity involving the lateral aspect of the right chest wall.  IMPRESSION: Hyperexpanded lungs without acute cardiopulmonary disease.   Original Report Authenticated By: Tacey Ruiz, MD   US Abdomen Complete  06/28/2013   *RADIOLOGY REPORT*  Clinical Data:  Fever. Elevated white count.  Elevated LFTs.  COMPLETE ABDOMINAL ULTRASOUND  Comparison:  None available.  Findings:  Gallbladder:  1.5 cm mobile shadowing stone is present within the gallbladder.  Wall thickness is at the upper limits of normal at 3.6 mm.  There  is no sonographic Murphy's sign.  Common bile duct:  The common bile duct is markedly dilated, measuring 17 mm.  No obstructing lesion or stone is identified.  Liver:  Moderate intrahepatic biliary dilation is present.  No focal mass lesion is present.  IVC:  Appears normal.  Pancreas:  Visualization of the pancreas is limited to the pancreatic head due to overlying bowel gas.  No obstructing lesion is identified.  Spleen:  Normal size and echotexture without focal parenchymal abnormality.  The maximal length is 7.2 cm.  Right Kidney:  The renal cortex is thinned.  The kidney is hyperechoic.  No focal mass lesion or stone is present.  The maximal length is 10.8 cm, within normal limits.  Left Kidney:  The renal cortex is thinned.  The kidney is hyperechoic.  No focal mass lesion or stone is present.  There is no hydronephrosis.  The maximal length is 10.7 cm, within normal limits.  Abdominal aorta:  The distal aorta is obscured by gas.  The more proximal aorta is within normal limits.  IMPRESSION:  1.  Marked intra and extrahepatic biliary dilation.  No obstructing lesion or stone is identified.  The common bile duct is dilated into  the pancreatic head. 2.  Bilateral hyperechoic kidneys with thinning of the parenchyma. This is compatible with nonspecific medical renal disease. 3.  Single large gallstone without evidence for acute cholecystitis.   Original Report Authenticated By: Marin Roberts, M.D.   Ct Abdomen Pelvis W Contrast  06/29/2013   *RADIOLOGY REPORT*  Clinical Data: Fever.  Bile duct dilatation on previous ultrasound.  CT ABDOMEN AND PELVIS WITH CONTRAST  Technique:  Multidetector CT imaging of the abdomen and pelvis was performed following the standard protocol during bolus administration of intravenous contrast.  Contrast: OMNIPAQUE IOHEXOL 300 MG/ML  SOLN  Comparison: Ultrasound abdomen 07/24/ 2014  Findings: Atelectasis in the lung bases.  Coronary artery calcifications.  There is diffuse intra and extrahepatic bile duct dilatation extending down to the pancreatic head region.  No visualized obstructing mass or stone is identified.  The pancreatic duct is not dilated and the pancreas is atrophic.  There is a stone in the gallbladder.  No gallbladder wall thickening or inflammatory change.  No focal liver lesions.  Spleen size is normal.  There is a right adrenal gland nodule measuring 1.7 cm diameter.  This is likely to represent an adenoma.  The kidneys are somewhat atrophic without focal mass lesion or hydronephrosis.  Calcification of the abdominal aorta without aneurysm.  Tortuous aorta.  Normal appearance of inferior vena cava.  No retroperitoneal lymphadenopathy.  The stomach and small bowel are not abnormally distended.  Stool filled colon without distension or wall thickening.  No free air or free fluid in the abdomen.  Pelvis:  Visualization of the low pelvis is limited due to streak artifact arising from right hip prosthesis.  The prostate gland appears enlarged at 5.2 x 4.5 cm diameter.  No apparent bladder wall thickening.  The rectosigmoid colon is decompressed without evidence of diverticulitis.  No  free or loculated pelvic fluid collections.  Appendix is normal.  No significant pelvic lymphadenopathy.  Surgical clips in the left groin.  Degenerative changes in the lumbar spine.  No destructive bone lesions appreciated.  Degenerative disc disease at L3-4 and L4-5 levels.  IMPRESSION: Intra and extrahepatic bile duct dilatation is present.  No cause of obstruction is identified. An occult obstructing stone or mass should be excluded.  ERCP or  MRCP recommended to assess for cause of obstruction.  Cholelithiasis without inflammatory change.  No evidence of bowel obstruction or inflammatory change.  Prostate gland enlargement.   Original Report Authenticated By: Burman Nieves, M.D.   Mr Mrcp  06/29/2013   *RADIOLOGY REPORT*  Clinical Data:  biliary ductal dilatation.  MRI ABDOMEN WITHOUT   CONTRAST (MRCP)  Technique:  Multiplanar multisequence MR imaging of the abdomen was performed without and with contrast, including heavily T2-weighted images of the biliary and pancreatic ducts.  Three-dimensional MR images were rendered by post processing of the original MR data.  Contrast:  15 ml Multihance  Comparison:  CT 06/29/2013  Findings:  There is extensive intra and extra hepatic biliary ductal dilatation involving  the left and right hepatic lobes.  The common hepatic duct is dilated to 13 mm.  There is a large filling defect at the confluence of the common hepatic duct and the cystic duct measuring 13 mm (image 26, series four).   More distally in the common bile duct there are two filling defects which have faceted margins measuring 9  and 7 mm on images 29 and 30 respectively of the same series.  There is a 14 mm filling defect within the fundus of the gallbladder.  There are multiple small cysts scattered throughout the pancreas. No discrete pancreatic ductal dilatation is present.  Most of these small cystic change are less than 10 mm.  There is a 19 mm cyst in the head of pancreas (image 34, series 4).   The spleen, adrenal glands, and kidneys are normal.  The stomach limited view of the small bowel and colon unremarkable.  No pelvic lymphadenopathy.  There is bilateral small effusions.  IMPRESSION:  1.  Two obstructing stones within the common bile duct and a large stone at the confluence of the cystic duct and the common hepatic duct.  There is significant biliary obstruction t proximal to the stones.  2.  Single gallstone within the gallbladder.  Cystic duct is also dilated. 3.  Multiple cysts throughout the pancreas suggest chronic pancreatitis.  Findings conveyed to Jadene Pierini, PA on 06/29/2013 at 1515 hours.   Original Report Authenticated By: Genevive Bi, M.D.   Mr 3d Recon At Scanner  06/29/2013   *RADIOLOGY REPORT*  Clinical Data:  biliary ductal dilatation.  MRI ABDOMEN WITHOUT   CONTRAST (MRCP)  Technique:  Multiplanar multisequence MR imaging of the abdomen was performed without and with contrast, including heavily T2-weighted images of the biliary and pancreatic ducts.  Three-dimensional MR images were rendered by post processing of the original MR data.  Contrast:  15 ml Multihance  Comparison:  CT 06/29/2013  Findings:  There is extensive intra and extra hepatic biliary ductal dilatation involving  the left and right hepatic lobes.  The common hepatic duct is dilated to 13 mm.  There is a large filling defect at the confluence of the common hepatic duct and the cystic duct measuring 13 mm (image 26, series four).   More distally in the common bile duct there are two filling defects which have faceted margins measuring 9  and 7 mm on images 29 and 30 respectively of the same series.  There is a 14 mm filling defect within the fundus of the gallbladder.  There are multiple small cysts scattered throughout the pancreas. No discrete pancreatic ductal dilatation is present.  Most of these small cystic change are less than 10 mm.  There is a 19 mm cyst in the  head of pancreas (image 34, series 4).   The spleen, adrenal glands, and kidneys are normal.  The stomach limited view of the small bowel and colon unremarkable.  No pelvic lymphadenopathy.  There is bilateral small effusions.  IMPRESSION:  1.  Two obstructing stones within the common bile duct and a large stone at the confluence of the cystic duct and the common hepatic duct.  There is significant biliary obstruction t proximal to the stones.  2.  Single gallstone within the gallbladder.  Cystic duct is also dilated. 3.  Multiple cysts throughout the pancreas suggest chronic pancreatitis.  Findings conveyed to Jadene Pierini, PA on 06/29/2013 at 1515 hours.   Original Report Authenticated By: Genevive Bi, M.D.     Additional labs:   Scheduled Meds: . ampicillin-sulbactam (UNASYN) IV  3 g Intravenous Q8H  . famotidine  20 mg Oral QHS  . furosemide  20 mg Oral Daily  . pantoprazole  40 mg Oral Daily  . sodium chloride  3 mL Intravenous Q12H   Continuous Infusions: . sodium chloride 50 mL/hr at 06/29/13 1224    Principal Problem:   Common bile duct dilation Active Problems:   HYPERLIPIDEMIA   BLINDNESS, ONE EYE   SINUS BRADYCARDIA   Personal history of malignant melanoma of skin   CVA (cerebral vascular accident)   Protein-calorie malnutrition, severe   Abnormal echocardiogram   Ventricular tachycardia, non-sustained   Atrial fibrillation    Time spent: 25 minutes.    [facility]  [facility] Pager [phone].   If 8PM-8AM, please contact night-coverage at [email or web address], password [facility] 06/30/2013, 12:39 PM  LOS: 2 days

## 2013-06-30 NOTE — Progress Notes (Signed)
Cross [facility]  Subjective: Patient seems to be comfortable today. He claims he has never had abdominal pain. He had vomited once after a meal. He seems to be tolerating his meals well.   Objective: Vital signs in last 24 hours: Temp:  [98.1 F (36.7 C)-98.2 F (36.8 C)] 98.2 F (36.8 C) (07/26 0532) Pulse Rate:  [55-61] 55 (07/26 0532) Resp:  [18-19] 19 (07/26 0532) BP: (116-156)/(56-76) 156/76 mmHg (07/26 0532) SpO2:  [97 %-100 %] 100 % (07/26 0532) Last BM Date: 06/28/13  Intake/Output from previous day: 07/25 0701 - 07/26 0700 In: 1085 [P.O.:120; I.V.:765; IV Piggyback:200] Out: 225 [Urine:225] Intake/Output this shift:   General appearance: alert, cooperative, appears stated age and no distress; his is HOH Resp: clear to auscultation bilaterally Cardio: regular rate and rhythm, S1, S2 normal, no murmur, click, rub or gallop GI: soft, non-tender; bowel sounds normal; no masses,  no organomegaly  Lab Results:  Recent Labs  06/28/13 2114 06/29/13 0435 06/30/13 0500  WBC 24.5* 24.5* 9.1  HGB 11.2* 10.7* 10.8*  HCT 34.3* 33.4* 33.3*  PLT 345 388 351   BMET  Recent Labs  06/28/13 2114 06/29/13 0435 06/30/13 0500  NA 136 134* 134*  K 3.6 4.1 4.2  CL 100 98 102  CO2 25 27 24   GLUCOSE 111* 119* 76  BUN 15 16 16   CREATININE 0.97 1.04 0.86  CALCIUM 8.2* 8.0* 8.1*   LFT  Recent Labs  06/28/13 2114  06/30/13 0500  PROT 5.8*  < > 5.1*  ALBUMIN 2.4*  < > 1.9*  AST 60*  < > 48*  ALT 52  < > 44  ALKPHOS 866*  < > 647*  BILITOT 1.1  < > 0.8  BILIDIR 0.7*  --   --   IBILI 0.4  --   --   < > = values in this interval not displayed. PT/INR  Recent Labs  06/29/13 0435 06/30/13 0500  LABPROT 28.2* 23.7*  INR 2.76* 2.20*   Studies/Results: Dg Chest 1 View  06/28/2013   *RADIOLOGY REPORT*  Clinical Data: Fever, COPD, hypertension  CHEST - 1 VIEW  Comparison: 05/29/2013; 10/15/2012l 05/13/2010; PET CT - 06/26/2008  Findings: Grossly unchanged enlarged  cardiac silhouette and mediastinal contours with apparent accentuated ectasia of the thoracic aorta due to patient rotation to the right.  The lungs appear hyperexpanded with flattening of the bilateral hemidiaphragms.  No new focal airspace opacities.  No pleural effusion or pneumothorax.  There is persistent deformity involving the lateral aspect of the right chest wall.  IMPRESSION: Hyperexpanded lungs without acute cardiopulmonary disease.   Original Report Authenticated By: Tacey Ruiz, MD   US Abdomen Complete  06/28/2013   *RADIOLOGY REPORT*  Clinical Data:  Fever. Elevated white count.  Elevated LFTs.  COMPLETE ABDOMINAL ULTRASOUND  Comparison:  None available.  Findings:  Gallbladder:  1.5 cm mobile shadowing stone is present within the gallbladder.  Wall thickness is at the upper limits of normal at 3.6 mm.  There is no sonographic Murphy's sign.  Common bile duct:  The common bile duct is markedly dilated, measuring 17 mm.  No obstructing lesion or stone is identified.  Liver:  Moderate intrahepatic biliary dilation is present.  No focal mass lesion is present.  IVC:  Appears normal.  Pancreas:  Visualization of the pancreas is limited to the pancreatic head due to overlying bowel gas.  No obstructing lesion is identified.  Spleen:  Normal size and echotexture without focal parenchymal  abnormality.  The maximal length is 7.2 cm.  Right Kidney:  The renal cortex is thinned.  The kidney is hyperechoic.  No focal mass lesion or stone is present.  The maximal length is 10.8 cm, within normal limits.  Left Kidney:  The renal cortex is thinned.  The kidney is hyperechoic.  No focal mass lesion or stone is present.  There is no hydronephrosis.  The maximal length is 10.7 cm, within normal limits.  Abdominal aorta:  The distal aorta is obscured by gas.  The more proximal aorta is within normal limits.  IMPRESSION:  1.  Marked intra and extrahepatic biliary dilation.  No obstructing lesion or stone is  identified.  The common bile duct is dilated into the pancreatic head. 2.  Bilateral hyperechoic kidneys with thinning of the parenchyma. This is compatible with nonspecific medical renal disease. 3.  Single large gallstone without evidence for acute cholecystitis.   Original Report Authenticated By: Marin Roberts, M.D.   Ct Abdomen Pelvis W Contrast  06/29/2013   *RADIOLOGY REPORT*  Clinical Data: Fever.  Bile duct dilatation on previous ultrasound.  CT ABDOMEN AND PELVIS WITH CONTRAST  Technique:  Multidetector CT imaging of the abdomen and pelvis was performed following the standard protocol during bolus administration of intravenous contrast.  Contrast: OMNIPAQUE IOHEXOL 300 MG/ML  SOLN  Comparison: Ultrasound abdomen 07/24/ 2014  Findings: Atelectasis in the lung bases.  Coronary artery calcifications.  There is diffuse intra and extrahepatic bile duct dilatation extending down to the pancreatic head region.  No visualized obstructing mass or stone is identified.  The pancreatic duct is not dilated and the pancreas is atrophic.  There is a stone in the gallbladder.  No gallbladder wall thickening or inflammatory change.  No focal liver lesions.  Spleen size is normal.  There is a right adrenal gland nodule measuring 1.7 cm diameter.  This is likely to represent an adenoma.  The kidneys are somewhat atrophic without focal mass lesion or hydronephrosis.  Calcification of the abdominal aorta without aneurysm.  Tortuous aorta.  Normal appearance of inferior vena cava.  No retroperitoneal lymphadenopathy.  The stomach and small bowel are not abnormally distended.  Stool filled colon without distension or wall thickening.  No free air or free fluid in the abdomen.  Pelvis:  Visualization of the low pelvis is limited due to streak artifact arising from right hip prosthesis.  The prostate gland appears enlarged at 5.2 x 4.5 cm diameter.  No apparent bladder wall thickening.  The rectosigmoid colon is  decompressed without evidence of diverticulitis.  No free or loculated pelvic fluid collections.  Appendix is normal.  No significant pelvic lymphadenopathy.  Surgical clips in the left groin.  Degenerative changes in the lumbar spine.  No destructive bone lesions appreciated.  Degenerative disc disease at L3-4 and L4-5 levels.  IMPRESSION: Intra and extrahepatic bile duct dilatation is present.  No cause of obstruction is identified. An occult obstructing stone or mass should be excluded.  ERCP or MRCP recommended to assess for cause of obstruction.  Cholelithiasis without inflammatory change.  No evidence of bowel obstruction or inflammatory change.  Prostate gland enlargement.   Original Report Authenticated By: Burman Nieves, M.D.   Mr Mrcp  06/29/2013   *RADIOLOGY REPORT*  Clinical Data:  biliary ductal dilatation.  MRI ABDOMEN WITHOUT   CONTRAST (MRCP)  Technique:  Multiplanar multisequence MR imaging of the abdomen was performed without and with contrast, including heavily T2-weighted images of the biliary  and pancreatic ducts.  Three-dimensional MR images were rendered by post processing of the original MR data.  Contrast:  15 ml Multihance  Comparison:  CT 06/29/2013  Findings:  There is extensive intra and extra hepatic biliary ductal dilatation involving  the left and right hepatic lobes.  The common hepatic duct is dilated to 13 mm.  There is a large filling defect at the confluence of the common hepatic duct and the cystic duct measuring 13 mm (image 26, series four).   More distally in the common bile duct there are two filling defects which have faceted margins measuring 9  and 7 mm on images 29 and 30 respectively of the same series.  There is a 14 mm filling defect within the fundus of the gallbladder.  There are multiple small cysts scattered throughout the pancreas. No discrete pancreatic ductal dilatation is present.  Most of these small cystic change are less than 10 mm.  There is a 19 mm  cyst in the head of pancreas (image 34, series 4).  The spleen, adrenal glands, and kidneys are normal.  The stomach limited view of the small bowel and colon unremarkable.  No pelvic lymphadenopathy.  There is bilateral small effusions.  IMPRESSION:  1.  Two obstructing stones within the common bile duct and a large stone at the confluence of the cystic duct and the common hepatic duct.  There is significant biliary obstruction t proximal to the stones.  2.  Single gallstone within the gallbladder.  Cystic duct is also dilated. 3.  Multiple cysts throughout the pancreas suggest chronic pancreatitis.  Findings conveyed to Jadene Pierini, PA on 06/29/2013 at 1515 hours.   Original Report Authenticated By: Genevive Bi, M.D.   Mr 3d Recon At Scanner  06/29/2013   *RADIOLOGY REPORT*  Clinical Data:  biliary ductal dilatation.  MRI ABDOMEN WITHOUT   CONTRAST (MRCP)  Technique:  Multiplanar multisequence MR imaging of the abdomen was performed without and with contrast, including heavily T2-weighted images of the biliary and pancreatic ducts.  Three-dimensional MR images were rendered by post processing of the original MR data.  Contrast:  15 ml Multihance  Comparison:  CT 06/29/2013  Findings:  There is extensive intra and extra hepatic biliary ductal dilatation involving  the left and right hepatic lobes.  The common hepatic duct is dilated to 13 mm.  There is a large filling defect at the confluence of the common hepatic duct and the cystic duct measuring 13 mm (image 26, series four).   More distally in the common bile duct there are two filling defects which have faceted margins measuring 9  and 7 mm on images 29 and 30 respectively of the same series.  There is a 14 mm filling defect within the fundus of the gallbladder.  There are multiple small cysts scattered throughout the pancreas. No discrete pancreatic ductal dilatation is present.  Most of these small cystic change are less than 10 mm.  There is a 19 mm  cyst in the head of pancreas (image 34, series 4).  The spleen, adrenal glands, and kidneys are normal.  The stomach limited view of the small bowel and colon unremarkable.  No pelvic lymphadenopathy.  There is bilateral small effusions.  IMPRESSION:  1.  Two obstructing stones within the common bile duct and a large stone at the confluence of the cystic duct and the common hepatic duct.  There is significant biliary obstruction t proximal to the stones.  2.  Single  gallstone within the gallbladder.  Cystic duct is also dilated. 3.  Multiple cysts throughout the pancreas suggest chronic pancreatitis.  Findings conveyed to Jadene Pierini, PA on 06/29/2013 at 1515 hours.   Original Report Authenticated By: Genevive Bi, M.D.   Medications: I have reviewed the patient's current medications.  Assessment/Plan: 1) Choledocholithiasis/cholelithiasis/abnormal LFT's: Awaiting possible ERCP on Monday. As per my conversation with Dr. Leone Payor, Dr. Marina Goodell will make a decision on Monday after evaluating the patient about the next step-?ERCP 1 st vs Lap chole with IOC. Continue present care. His WBC count has normalized and afebrile on antibiotics.  2) ?Chronic pancreatitis.  3) GERD on PPI's. . LOS: 2 days   Avel Ogawa 06/30/2013, 10:00 AM

## 2013-06-30 NOTE — Progress Notes (Signed)
Per MD, Pt not ready for d/c over the weekend, as Pt will be having a procedure on Mon.  Providence Crosby, LCSWA Clinical Social Work [phone]

## 2013-06-30 NOTE — Progress Notes (Addendum)
[name]  79 y.o.  male  Subjective: Delightful older gentleman without GI or cardiac complaints at present  Allergy: Sulfonamide derivatives  Objective: Vital signs in last 24 hours: Temp:  [98.1 F (36.7 C)-98.2 F (36.8 C)] 98.2 F (36.8 C) (07/26 0532) Pulse Rate:  [55-61] 55 (07/26 0532) Resp:  [18-19] 19 (07/26 0532) BP: (116-156)/(56-76) 156/76 mmHg (07/26 0532) SpO2:  [97 %-100 %] 100 % (07/26 0532)  67.6 kg (149 lb 0.5 oz) Body mass index is 27.25 kg/(m^2).  Weight change:  Last BM Date: 29-Jun-2013  Intake/Output from previous day: 07/25 0701 - 07/26 0700 In: 1085 [P.O.:120; I.V.:765; IV Piggyback:200] Out: 225 [Urine:225] Total I/O since admission: +950 cc   General- Well developed; no acute distress ; markedly impaired hearing Neck- No JVD, no carotid bruits;  Lungs- clear lung fields; normal I:E ratio Cardiovascular- normal PMI; normal S1 and S2; + S4; grade 1-2/6 systolic murmur at the left sternal border Abdomen- normal bowel sounds; soft and non-tender without masses or organomegaly Skin- Warm, no significant lesions Extremities- Nl distal pulses; no edema; right upper extremity contracture and paresis  Lab Results: CBC:   Recent Labs  06/29/13 0435 06/30/13 0500  WBC 24.5* 9.1  HGB 10.7* 10.8*  HCT 33.4* 33.3*  PLT 388 351   BMET:  Recent Labs  06/29/13 0435 06/30/13 0500  NA 134* 134*  K 4.1 4.2  CL 98 102  CO2 27 24  GLUCOSE 119* 76  BUN 16 16  CREATININE 1.04 0.86  CALCIUM 8.0* 8.1*   Hepatic Function:   Recent Labs  Jun 29, 2013 2114  06/30/13 0500  PROT 5.8*  < > 5.1*  ALBUMIN 2.4*  < > 1.9*  AST 60*  < > 48*  ALT 52  < > 44  ALKPHOS 866*  < > 647*  BILITOT 1.1  < > 0.8  BILIDIR 0.7*  --   --   IBILI 0.4  --   --   < > = values in this interval not displayed. GFR:  Estimated Creatinine Clearance: 47.3 ml/min (by C-G formula based on Cr of 0.86).  EKG: 06/29/2013-normal sinus rhythm with PACs Telemetry: Normal sinus  rhythm and sinus bradycardia; PACs; PVCs; one very short episode of atrial fibrillation  Imaging Studies/Results: Dg Chest 1 View 2013-06-29  Hyperexpanded lungs without acute cardiopulmonary disease.    US Abdomen Complete  06-29-13   IMPRESSION:  1.  Marked intra and extrahepatic biliary dilation.  No obstructing lesion or stone is identified.  The common bile duct is dilated into the pancreatic head. 2.  Bilateral hyperechoic kidneys with thinning of the parenchyma. This is compatible with nonspecific medical renal disease. 3.  Single large gallstone without evidence for acute cholecystitis.   Ct Abdomen Pelvis W Contrast  06/29/2013     IMPRESSION: Intra and extrahepatic bile duct dilatation is present.  No cause of obstruction is identified. An occult obstructing stone or mass should be excluded.  ERCP or MRCP recommended to assess for cause of obstruction.  Cholelithiasis without inflammatory change.    MRI  IMPRESSION:  1.  Two obstructing stones within the common bile duct and a large stone at the confluence of the cystic duct and the common hepatic duct.  There is significant biliary obstruction t proximal to the stones.  2.  Single gallstone within the gallbladder.  Cystic duct is also dilated. 3.  Multiple cysts throughout the pancreas suggest chronic pancreatitis. bladder.  Cystic duct is also dilated. 3.  Multiple cysts throughout the pancreas suggest chronic pancreatitis.    Imaging: Imaging results have been reviewed  Medications:  I have reviewed the patient's current medications. Scheduled: . ampicillin-sulbactam (UNASYN) IV  3 g Intravenous Q8H  . famotidine  20 mg Oral QHS  . furosemide  20 mg Oral Daily  . pantoprazole  40 mg Oral Daily  . sodium chloride  3 mL Intravenous Q12H    Assessment/Plan: Atrial fibrillation: No sustained recurrence; continue to monitor.  Ventricular tachycardia: Asymptomatic; with normal LV systolic function, prognosis should be good with  respect to her arrhythmia.  DVT prophylaxis: None administered; INR should be subtherapeutic within the next 24 hours; would add appropriate dose enoxaparin or similar agent.  No need for full anticoagulation while we are monitoring unless sustained AF recurs.  Biliary obstruction: Leukocytosis has resolved as have symptoms; ERCP planned.   LOS: 2 days   [place] Bing 06/30/2013, 8:19 AM

## 2013-07-01 DIAGNOSIS — K8050 Calculus of bile duct without cholangitis or cholecystitis without obstruction: Secondary | ICD-10-CM

## 2013-07-01 DIAGNOSIS — K8020 Calculus of gallbladder without cholecystitis without obstruction: Secondary | ICD-10-CM

## 2013-07-01 DIAGNOSIS — I495 Sick sinus syndrome: Secondary | ICD-10-CM

## 2013-07-01 MED ORDER — ENOXAPARIN SODIUM 40 MG/0.4ML ~~LOC~~ SOLN
40.0000 mg | SUBCUTANEOUS | Status: DC
  Administered 2013-07-01 – 2013-07-02 (×2): 40 mg via SUBCUTANEOUS
  Filled 2013-07-01 (×3): qty 0.4

## 2013-07-01 MED ORDER — PHYTONADIONE 5 MG PO TABS
2.5000 mg | ORAL_TABLET | Freq: Once | ORAL | Status: AC
  Administered 2013-07-01: 2.5 mg via ORAL
  Filled 2013-07-01: qty 1

## 2013-07-01 NOTE — Progress Notes (Addendum)
[facility] PROGRESS NOTE  [name] JXB:[id number] DOB: Jul 03, 1919 DOA: 06/28/2013 PCP: [name], MD  Brief narrative 79 year old male, with history of A. fib on Coumadin, malignant melanoma, right femoral neck fracture, embolic CVA, OSA, HTN, HL, mechanical fall on 05/29/13, was sent to the [facility] ED from [facility] on 06/28/13 with complaints of an episode of vomiting and fever of 103.72F. Patient also gave history of one-2 weeks of decreased appetite but no abdominal pain. In the ED, WBC 24.5, alkaline phosphatase 866 & abdominal ultrasound which showed marked intra-and extrahepatic biliary dietitian without obstructing stone or lesion. He was admitted for further evaluation and management.   Assessment/Plan: 1. Biliary obstruction secondary to choledocholithiasis/cholangitis (elevated alkaline phosphatase, AST & ALT): Followup CT abdomen 06/29/13 shows intra-and extrahepatic bile the dietitian but no cause of obstruction identified. Cholelithiasis without inflammatory changes. Patient asymptomatic of abdominal pain and no further vomiting. [facility]  input appreciated-they plan to reevaluate on Monday to decide next step-? ERCP versus laparoscopic cholecystectomy with IOC. Continue empiric IV Unasyn given presentation with fever and leukocytosis-? Cholangitis-however does not appear toxic or septic. Patient spiked a fever last night of 102F. Blood cultures drawn. 2. Leukocytosis: Possibly secondary to problem #1. Continue empiric IV Unasyn for now. Blood cultures if he spikes temperature. Resolved.  3. Anemia: Hemoglobin stable.  4. History of A. fib/chronic Coumadin: INR: 1.9. Currently in sinus rhythm. Does not seem to be on any rate controlled medications at home. Monitor on telemetry. Echo shows EF 55% & basal, inferior and posterior severe hypokinesis. [facility]  input appreciated-no further workup due to lack of symptoms and overall frailty.  Coumadin has been held. Patient will receive vitamin K x1 dose today to reverse INR for procedure in a.m. 5. NSVT: Patient had a run of 7 beat NSVT on 7/25 at 12:10 PM. Echo results as above. Cardiology consultation appreciated. They recommend conservative treatment. They do not recommend beta blockers or calcium channel blockers due to sinus bradycardia on the monitor. No further events on monitor. 6. History of embolic CVA: Therapeutically anticoagulated. Will defer decision regarding continuing versus discontinuing Coumadin to his outpatient him days. Will resume Coumadin when cleared by GI. Management as above. Start Lovenox for DVT prophylaxis. 7. HTN: Controlled. 8. OSA: Not on CPAP.  Code Status: DO NOT RESUSCITATE Family Communication: Discussed with son Mr. Camrin Gearheart on 7/27.  Disposition Plan: Return to ALF when stable.   Consultants:  [facility]  Procedures:  None  Antibiotics:  IV Unasyn 7/25 >   HPI/Subjective: Denies complaints. Spiked a temperature of 102F overnight.  Objective: Filed Vitals:   06/30/13 2208 07/01/13 0030 07/01/13 0443 07/01/13 1017  BP:   133/67   Pulse: 86  58 55  Temp:  97.2 F (36.2 C) 98.6 F (37 C)   TempSrc:  Oral Axillary   Resp: 18  19   Height:      Weight:      SpO2:   100%     Intake/Output Summary (Last 24 hours) at 07/01/13 1321 Last data filed at 07/01/13 0700  Gross per 24 hour  Intake    540 ml  Output    645 ml  Net   -105 ml   Filed Weights   06/29/13 0311  Weight: 67.6 kg (149 lb 0.5 oz)    Exam:   General exam: Comfortable. Elderly flail pleasant male. Hard of hearing. Hearing aid in right ear.  Respiratory  system: Clear. No increased work of breathing.  Cardiovascular system: S1 & S2 heard, RRR. No JVD, murmurs, gallops, clicks or pedal edema. Telemetry: Sinus bradycardia in the 50s. No further arrhythmia since 7/25.   Gastrointestinal system: Abdomen is nondistended, soft  and nontender. Normal bowel sounds heard.  Central nervous system: Alert and oriented. No focal neurological deficits.  Extremities: Symmetric 5 x 5 power.   Data Reviewed: Basic Metabolic Panel:  Recent Labs Lab 06/28/13 2114 06/29/13 0435 06/29/13 1820 06/30/13 0500  NA 136 134*  --  134*  K 3.6 4.1  --  4.2  CL 100 98  --  102  CO2 25 27  --  24  GLUCOSE 111* 119*  --  76  BUN 15 16  --  16  CREATININE 0.97 1.04  --  0.86  CALCIUM 8.2* 8.0*  --  8.1*  MG  --   --  1.8  --    Liver Function Tests:  Recent Labs Lab 06/28/13 2114 06/29/13 0435 06/30/13 0500  AST 60* 66* 48*  ALT 52 55* 44  ALKPHOS 866* 741* 647*  BILITOT 1.1 1.1 0.8  PROT 5.8* 5.4* 5.1*  ALBUMIN 2.4* 2.2* 1.9*    Recent Labs Lab 06/28/13 2114  LIPASE 18   No results found for this basename: AMMONIA,  in the last 168 hours CBC:  Recent Labs Lab 06/28/13 2114 06/29/13 0435 06/30/13 0500  WBC 24.5* 24.5* 9.1  NEUTROABS 22.8*  --   --   HGB 11.2* 10.7* 10.8*  HCT 34.3* 33.4* 33.3*  MCV 89.1 89.8 90.2  PLT 345 388 351   Cardiac Enzymes: No results found for this basename: CKTOTAL, CKMB, CKMBINDEX, TROPONINI,  in the last 168 hours BNP (last 3 results) No results found for this basename: PROBNP,  in the last 8760 hours CBG: No results found for this basename: GLUCAP,  in the last 168 hours  Recent Results (from the past 240 hour(s))  MRSA PCR SCREENING     Status: None   Collection Time    06/29/13  3:09 AM      Result Value Range Status   MRSA by PCR NEGATIVE  NEGATIVE Final   Comment:            The GeneXpert MRSA Assay (FDA     approved for NASAL specimens     only), is one component of a     comprehensive MRSA colonization     surveillance program. It is not     intended to diagnose MRSA     infection nor to guide or     monitor treatment for     MRSA infections.     Studies: Mr Mrcp  06/29/2013   *RADIOLOGY REPORT*  Clinical Data:  biliary ductal dilatation.  MRI  ABDOMEN WITHOUT   CONTRAST (MRCP)  Technique:  Multiplanar multisequence MR imaging of the abdomen was performed without and with contrast, including heavily T2-weighted images of the biliary and pancreatic ducts.  Three-dimensional MR images were rendered by post processing of the original MR data.  Contrast:  15 ml Multihance  Comparison:  CT 06/29/2013  Findings:  There is extensive intra and extra hepatic biliary ductal dilatation involving  the left and right hepatic lobes.  The common hepatic duct is dilated to 13 mm.  There is a large filling defect at the confluence of the common hepatic duct and the cystic duct measuring 13 mm (image 26, series four).   More distally  in the common bile duct there are two filling defects which have faceted margins measuring 9  and 7 mm on images 29 and 30 respectively of the same series.  There is a 14 mm filling defect within the fundus of the gallbladder.  There are multiple small cysts scattered throughout the pancreas. No discrete pancreatic ductal dilatation is present.  Most of these small cystic change are less than 10 mm.  There is a 19 mm cyst in the head of pancreas (image 34, series 4).  The spleen, adrenal glands, and kidneys are normal.  The stomach limited view of the small bowel and colon unremarkable.  No pelvic lymphadenopathy.  There is bilateral small effusions.  IMPRESSION:  1.  Two obstructing stones within the common bile duct and a large stone at the confluence of the cystic duct and the common hepatic duct.  There is significant biliary obstruction t proximal to the stones.  2.  Single gallstone within the gallbladder.  Cystic duct is also dilated. 3.  Multiple cysts throughout the pancreas suggest chronic pancreatitis.  Findings conveyed to Jadene Pierini, PA on 06/29/2013 at 1515 hours.   Original Report Authenticated By: Genevive Bi, M.D.   Mr 3d Recon At Scanner  06/29/2013   *RADIOLOGY REPORT*  Clinical Data:  biliary ductal dilatation.  MRI  ABDOMEN WITHOUT   CONTRAST (MRCP)  Technique:  Multiplanar multisequence MR imaging of the abdomen was performed without and with contrast, including heavily T2-weighted images of the biliary and pancreatic ducts.  Three-dimensional MR images were rendered by post processing of the original MR data.  Contrast:  15 ml Multihance  Comparison:  CT 06/29/2013  Findings:  There is extensive intra and extra hepatic biliary ductal dilatation involving  the left and right hepatic lobes.  The common hepatic duct is dilated to 13 mm.  There is a large filling defect at the confluence of the common hepatic duct and the cystic duct measuring 13 mm (image 26, series four).   More distally in the common bile duct there are two filling defects which have faceted margins measuring 9  and 7 mm on images 29 and 30 respectively of the same series.  There is a 14 mm filling defect within the fundus of the gallbladder.  There are multiple small cysts scattered throughout the pancreas. No discrete pancreatic ductal dilatation is present.  Most of these small cystic change are less than 10 mm.  There is a 19 mm cyst in the head of pancreas (image 34, series 4).  The spleen, adrenal glands, and kidneys are normal.  The stomach limited view of the small bowel and colon unremarkable.  No pelvic lymphadenopathy.  There is bilateral small effusions.  IMPRESSION:  1.  Two obstructing stones within the common bile duct and a large stone at the confluence of the cystic duct and the common hepatic duct.  There is significant biliary obstruction t proximal to the stones.  2.  Single gallstone within the gallbladder.  Cystic duct is also dilated. 3.  Multiple cysts throughout the pancreas suggest chronic pancreatitis.  Findings conveyed to Jadene Pierini, PA on 06/29/2013 at 1515 hours.   Original Report Authenticated By: Genevive Bi, M.D.     Additional labs:   Scheduled Meds: . ampicillin-sulbactam (UNASYN) IV  3 g Intravenous Q8H  .  enoxaparin (LOVENOX) injection  40 mg Subcutaneous Q24H  . famotidine  20 mg Oral QHS  . furosemide  20 mg Oral Daily  . pantoprazole  40 mg Oral Daily  . sodium chloride  3 mL Intravenous Q12H   Continuous Infusions: . sodium chloride Stopped (06/30/13 0900)    Principal Problem:   Choledocholithiasis with obstruction Active Problems:   HYPERLIPIDEMIA   BLINDNESS, ONE EYE   SINUS BRADYCARDIA   Personal history of malignant melanoma of skin   CVA (cerebral vascular accident)   Common bile duct dilation   Protein-calorie malnutrition, severe   Abnormal echocardiogram   Ventricular tachycardia, non-sustained   Atrial fibrillation    Time spent: 25 minutes.    [facility]  [facility] Pager [phone].   If 8PM-8AM, please contact night-coverage at [email or web address], password [facility] 07/01/2013, 1:21 PM  LOS: 3 days

## 2013-07-01 NOTE — Progress Notes (Signed)
Cross cover [facility] Subjective: Since I last evaluated the patient, he has spiked a fever upto 102 F. He however feels well at the present time. He denies having any abdominal pain, nausea or vomiting.  Objective: Vital signs in last 24 hours: Temp:  [97.2 F (36.2 C)-102 F (38.9 C)] 97.9 F (36.6 C) (07/27 1017) Pulse Rate:  [55-88] 55 (07/27 1017) Resp:  [18-20] 20 (07/27 1017) BP: (133-166)/(61-87) 158/80 mmHg (07/27 1411) SpO2:  [94 %-100 %] 100 % (07/27 1017) Last BM Date: 07/01/13 (small)  Intake/Output from previous day: 07/26 0701 - 07/27 0700 In: 790.5 [P.O.:360; I.V.:130.5; IV Piggyback:300] Out: 895 [Urine:895] Intake/Output this shift: Total I/O In: 250 [P.O.:120; I.V.:30; IV Piggyback:100] Out: 200 [Urine:200]  General appearance: alert, cooperative, appears stated age and no distress; extremely HOH Resp: clear to auscultation bilaterally Cardio: regular rate and rhythm, S1, S2 normal, no murmur, click, rub or gallop GI: soft, non-tender; bowel sounds normal; no masses,  no organomegaly Extremities: extremities normal, atraumatic, no cyanosis or edema  Lab Results:  Recent Labs  06/28/13 2114 06/29/13 0435 06/30/13 0500  WBC 24.5* 24.5* 9.1  HGB 11.2* 10.7* 10.8*  HCT 34.3* 33.4* 33.3*  PLT 345 388 351   BMET  Recent Labs  06/28/13 2114 06/29/13 0435 06/30/13 0500  NA 136 134* 134*  K 3.6 4.1 4.2  CL 100 98 102  CO2 25 27 24   GLUCOSE 111* 119* 76  BUN 15 16 16   CREATININE 0.97 1.04 0.86  CALCIUM 8.2* 8.0* 8.1*   LFT  Recent Labs  06/28/13 2114  06/30/13 0500  PROT 5.8*  < > 5.1*  ALBUMIN 2.4*  < > 1.9*  AST 60*  < > 48*  ALT 52  < > 44  ALKPHOS 866*  < > 647*  BILITOT 1.1  < > 0.8  BILIDIR 0.7*  --   --   IBILI 0.4  --   --   < > = values in this interval not displayed. PT/INR  Recent Labs  06/30/13 0500 07/01/13 0511  LABPROT 23.7* 22.0*  INR 2.20* 1.99*   Studies/Results: No results found.  Medications: I have  reviewed the patient's current medications.  Assessment/Plan: 1) Choledocholithiasis/cholelithiasis/cholangitis: seems to be stable on antibiotics-WBC count has normalized. Discussed the case with Dr. Alger Simons prefers the patient has an ERCP before a laproscopic cholecystectomy. He is going to get Vitamin K today as his INR is still 1.99.  LOS: 3 days   Scarleth Brame 07/01/2013, 4:27 PM

## 2013-07-01 NOTE — Progress Notes (Signed)
[name]  79 y.o.  male  Subjective: Delightful older gentleman without GI or cardiac complaints at present  Allergy: Sulfonamide derivatives  Objective: Vital signs in last 24 hours: Temp:  [97.2 F (36.2 C)-102 F (38.9 C)] 98.6 F (37 C) (07/27 0443) Pulse Rate:  [58-88] 58 (07/27 0443) Resp:  [18-19] 19 (07/27 0443) BP: (133-156)/(61-69) 133/67 mmHg (07/27 0443) SpO2:  [94 %-100 %] 100 % (07/27 0443)  67.6 kg (149 lb 0.5 oz) Body mass index is 27.25 kg/(m^2).  Weight change:  Last BM Date: 06/30/13  Intake/Output from previous day: 07/26 0701 - 07/27 0700 In: 790.5 [P.O.:360; I.V.:130.5; IV Piggyback:300] Out: 895 [Urine:895] Total I/O since admission: + 1.3 L.  General- Well developed; no acute distress ; markedly impaired hearing Neck- No JVD, no carotid bruits;  Lungs- clear lung fields; normal I:E ratio Cardiovascular- normal PMI; normal S1 and S2; + S4; grade 1-2/6 systolic murmur at the left sternal border Abdomen- normal bowel sounds; soft and non-tender without masses or organomegaly Skin- Warm, no significant lesions Extremities- Nl distal pulses; no edema; right upper extremity contracture and paresis  Lab Results: CBC:    Recent Labs  06/29/13 0435 06/30/13 0500  WBC 24.5* 9.1  HGB 10.7* 10.8*  HCT 33.4* 33.3*  PLT 388 351   BMET:   Recent Labs  06/29/13 0435 06/30/13 0500  NA 134* 134*  K 4.1 4.2  CL 98 102  CO2 27 24  GLUCOSE 119* 76  BUN 16 16  CREATININE 1.04 0.86  CALCIUM 8.0* 8.1*   Hepatic Function:   Recent Labs  07/20/13 2114  06/30/13 0500  PROT 5.8*  < > 5.1*  ALBUMIN 2.4*  < > 1.9*  AST 60*  < > 48*  ALT 52  < > 44  ALKPHOS 866*  < > 647*  BILITOT 1.1  < > 0.8  BILIDIR 0.7*  --   --   IBILI 0.4  --   --   < > = values in this interval not displayed. GFR:  Estimated Creatinine Clearance: 47.3 ml/min (by C-G formula based on Cr of 0.86).  EKG: 06/29/2013-normal sinus rhythm with PACs Telemetry: Normal sinus  rhythm and sinus bradycardia  Imaging Studies/Results: Dg Chest 1 View 07/20/13  Hyperexpanded lungs without acute cardiopulmonary disease.    US Abdomen Complete  07-20-2013   IMPRESSION:  1.  Marked intra and extrahepatic biliary dilation.  No obstructing lesion or stone is identified.  The common bile duct is dilated into the pancreatic head. 2.  Bilateral hyperechoic kidneys with thinning of the parenchyma. This is compatible with nonspecific medical renal disease. 3.  Single large gallstone without evidence for acute cholecystitis.   Ct Abdomen Pelvis W Contrast  06/29/2013     IMPRESSION: Intra and extrahepatic bile duct dilatation is present.  No cause of obstruction is identified. An occult obstructing stone or mass should be excluded.  ERCP or MRCP recommended to assess for cause of obstruction.  Cholelithiasis without inflammatory change.    MRI  IMPRESSION:  1.  Two obstructing stones within the common bile duct and a large stone at the confluence of the cystic duct and the common hepatic duct.  There is significant biliary obstruction t proximal to the stones.  2.  Single gallstone within the gallbladder.  Cystic duct is also dilated. 3.  Multiple cysts throughout the pancreas suggest chronic pancreatitis. bladder.  Cystic duct is also dilated. 3.  Multiple cysts throughout the pancreas suggest  chronic pancreatitis.    Imaging: Imaging results have been reviewed  Medications:  I have reviewed the patient's current medications. Scheduled: . ampicillin-sulbactam (UNASYN) IV  3 g Intravenous Q8H  . famotidine  20 mg Oral QHS  . furosemide  20 mg Oral Daily  . pantoprazole  40 mg Oral Daily  . sodium chloride  3 mL Intravenous Q12H    Assessment/Plan: Atrial fibrillation: Monitoring benign during the past 24 hrs  Ventricular tachycardia: Monitoring benign during the past 24 hrs  Anticoagulation: INR remains elevated.  To decrease the risk of having to postpone ERCP->vit. K.  Start  low dose enoxaparin this evening for DVT prophylaxis.  Biliary obstruction: Episodic fevers continue; ERCP planned.   LOS: 3 days   [name] 07/01/2013, 9:14 AM

## 2013-07-01 NOTE — Consult Note (Signed)
Reason for Consult:choledocholithiasis Referring Physician: Ammon Muscatello is an 79 y.o. male.  HPI: I was asked to evaluate this patient for choledocholithiasis. He came into the hospital for evaluation after spiking fevers and an episode of nonbloody nonbilious vomiting. He was found to have elevated LFTs and elevated white blood cell count and further workup revealed dilated common bile duct with choledocholithiasis. He says that he has not had any abdominal pain and really his only complaint is lack of appetite. He says that he has lost a lot of weight but again has no postprandial pain, jaundice, nausea, and he says that his bowels have been normal without any blood or melena. He had an MRCP which was concerning for obstructing gallstone in the common bile duct.  Past Medical History  Diagnosis Date  . GERD (gastroesophageal reflux disease)   . Hypertension   . Hearing loss     wears hearing aid - right side  . Leg swelling   . Trouble swallowing   . Bruises easily     due to coumadin  . Weakness     difficulty walking  . Contact lens/glasses fitting   . Stroke 1999  . Sleep apnea     uses cpap, pt does not know setting  . Cancer   . 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 Gait disturbance   . Dyslipidemia   . COPD (chronic obstructive pulmonary disease)   . Peripheral vascular disease   . History of melanoma   . Diverticulosis   . Abnormality of gait 05/16/2013    Past Surgical History  Procedure Laterality Date  . Excision of melanoma  2009    left leg  . Melanoma excision      many melanoma removed in past  . Melanoma excision  10/11/2011    Procedure: MELANOMA EXCISION;  Surgeon: Valarie Merino, MD;  Location: [facility];  Service: General;  Laterality: Left;  EXCISION melanoma left leg with full thickness skin grafting from left lower abdomen.  . Joint replacement  2012    r fem head fx  . Lung benign area removed   1970's  . Hernia repair  1983  . Melanoma excision   05/16/2012    Procedure: MELANOMA EXCISION;  Surgeon: Valarie Merino, MD;  Location: [facility];  Service: General;  Laterality: Left;  Nodule Removal of Melanoma on Left Shin    Family History  Problem Relation Age of Onset  . Heart disease Father 9  . Cancer Son     prostate  . Dementia Sister     One sister has dementia    Social History:  reports that he quit smoking about 48 years ago. His smoking use included Pipe. He has never used smokeless tobacco. He reports that he does not drink alcohol or use illicit drugs.  Allergies:  Allergies  Allergen Reactions  . Sulfonamide Derivatives Rash    REACTION: rash    Medications: reviewed in EPIC  Results for orders placed during the hospital encounter of 06/28/13 (from the past 48 hour(s))  MAGNESIUM     Status: None   Collection Time    06/29/13  6:20 PM      Result Value Range   Magnesium 1.8  1.5 - 2.5 mg/dL  CBC     Status: Abnormal   Collection Time    06/30/13  5:00 AM      Result Value Range   WBC 9.1  4.0 - 10.5 K/uL  RBC 3.69 (*) 4.22 - 5.81 MIL/uL   Hemoglobin 10.8 (*) 13.0 - 17.0 g/dL   HCT 16.1 (*) 09.6 - 04.5 %   MCV 90.2  78.0 - 100.0 fL   MCH 29.3  26.0 - 34.0 pg   MCHC 32.4  30.0 - 36.0 g/dL   RDW 40.9  81.1 - 91.4 %   Platelets 351  150 - 400 K/uL  COMPREHENSIVE METABOLIC PANEL     Status: Abnormal   Collection Time    06/30/13  5:00 AM      Result Value Range   Sodium 134 (*) 135 - 145 mEq/L   Potassium 4.2  3.5 - 5.1 mEq/L   Chloride 102  96 - 112 mEq/L   CO2 24  19 - 32 mEq/L   Glucose, Bld 76  70 - 99 mg/dL   BUN 16  6 - 23 mg/dL   Creatinine, Ser 7.82  0.50 - 1.35 mg/dL   Calcium 8.1 (*) 8.4 - 10.5 mg/dL   Total Protein 5.1 (*) 6.0 - 8.3 g/dL   Albumin 1.9 (*) 3.5 - 5.2 g/dL   AST 48 (*) 0 - 37 U/L   ALT 44  0 - 53 U/L   Alkaline Phosphatase 647 (*) 39 - 117 U/L   Total Bilirubin 0.8  0.3 - 1.2 mg/dL   GFR calc non Af Amer 74 (*) >90 mL/min   GFR calc Af Amer 85 (*) >90 mL/min    Comment:            The eGFR has been calculated     using the CKD EPI equation.     This calculation has not been     validated in all clinical     situations.     eGFR's persistently     <90 mL/min signify     possible Chronic Kidney Disease.  PROTIME-INR     Status: Abnormal   Collection Time    06/30/13  5:00 AM      Result Value Range   Prothrombin Time 23.7 (*) 11.6 - 15.2 seconds   INR 2.20 (*) 0.00 - 1.49  PROTIME-INR     Status: Abnormal   Collection Time    07/01/13  5:11 AM      Result Value Range   Prothrombin Time 22.0 (*) 11.6 - 15.2 seconds   INR 1.99 (*) 0.00 - 1.49    Mr Mrcp  06/29/2013   *RADIOLOGY REPORT*  Clinical Data:  biliary ductal dilatation.  MRI ABDOMEN WITHOUT   CONTRAST (MRCP)  Technique:  Multiplanar multisequence MR imaging of the abdomen was performed without and with contrast, including heavily T2-weighted images of the biliary and pancreatic ducts.  Three-dimensional MR images were rendered by post processing of the original MR data.  Contrast:  15 ml Multihance  Comparison:  CT 06/29/2013  Findings:  There is extensive intra and extra hepatic biliary ductal dilatation involving  the left and right hepatic lobes.  The common hepatic duct is dilated to 13 mm.  There is a large filling defect at the confluence of the common hepatic duct and the cystic duct measuring 13 mm (image 26, series four).   More distally in the common bile duct there are two filling defects which have faceted margins measuring 9  and 7 mm on images 29 and 30 respectively of the same series.  There is a 14 mm filling defect within the fundus of the gallbladder.  There are multiple small  cysts scattered throughout the pancreas. No discrete pancreatic ductal dilatation is present.  Most of these small cystic change are less than 10 mm.  There is a 19 mm cyst in the head of pancreas (image 34, series 4).  The spleen, adrenal glands, and kidneys are normal.  The stomach limited view of the  small bowel and colon unremarkable.  No pelvic lymphadenopathy.  There is bilateral small effusions.  IMPRESSION:  1.  Two obstructing stones within the common bile duct and a large stone at the confluence of the cystic duct and the common hepatic duct.  There is significant biliary obstruction t proximal to the stones.  2.  Single gallstone within the gallbladder.  Cystic duct is also dilated. 3.  Multiple cysts throughout the pancreas suggest chronic pancreatitis.  Findings conveyed to Jadene Pierini, PA on 06/29/2013 at 1515 hours.   Original Report Authenticated By: Genevive Bi, M.D.   Mr 3d Recon At Scanner  06/29/2013   *RADIOLOGY REPORT*  Clinical Data:  biliary ductal dilatation.  MRI ABDOMEN WITHOUT   CONTRAST (MRCP)  Technique:  Multiplanar multisequence MR imaging of the abdomen was performed without and with contrast, including heavily T2-weighted images of the biliary and pancreatic ducts.  Three-dimensional MR images were rendered by post processing of the original MR data.  Contrast:  15 ml Multihance  Comparison:  CT 06/29/2013  Findings:  There is extensive intra and extra hepatic biliary ductal dilatation involving  the left and right hepatic lobes.  The common hepatic duct is dilated to 13 mm.  There is a large filling defect at the confluence of the common hepatic duct and the cystic duct measuring 13 mm (image 26, series four).   More distally in the common bile duct there are two filling defects which have faceted margins measuring 9  and 7 mm on images 29 and 30 respectively of the same series.  There is a 14 mm filling defect within the fundus of the gallbladder.  There are multiple small cysts scattered throughout the pancreas. No discrete pancreatic ductal dilatation is present.  Most of these small cystic change are less than 10 mm.  There is a 19 mm cyst in the head of pancreas (image 34, series 4).  The spleen, adrenal glands, and kidneys are normal.  The stomach limited view of the  small bowel and colon unremarkable.  No pelvic lymphadenopathy.  There is bilateral small effusions.  IMPRESSION:  1.  Two obstructing stones within the common bile duct and a large stone at the confluence of the cystic duct and the common hepatic duct.  There is significant biliary obstruction t proximal to the stones.  2.  Single gallstone within the gallbladder.  Cystic duct is also dilated. 3.  Multiple cysts throughout the pancreas suggest chronic pancreatitis.  Findings conveyed to Jadene Pierini, PA on 06/29/2013 at 1515 hours.   Original Report Authenticated By: Genevive Bi, M.D.    All other review of systems negative or noncontributory except as stated in the HPI   Blood pressure 158/80, pulse 55, temperature 97.9 F (36.6 C), temperature source Oral, resp. rate 20, height 5\' 2"  (1.575 m), weight 149 lb 0.5 oz (67.6 kg), SpO2 100.00%. General appearance: alert, cooperative and no distress Head: Normocephalic, without obvious abnormality, atraumatic Eyes: rt eye drooping Ears: hard of hearing, bilat. hearing aids Resp: nonlabored Cardio: HR 55, regular to my exam GI: soft, non-tender; bowel sounds normal; no masses,  no organomegaly Extremities: right wrist and  finger contracture Pulses: 2+ and symmetric  Assessment/Plan: Cholelithiasis and choledocholithiasis with possible cholangitis The acute infection and cholangitis seems to be improved. His white count has normalized. He seems to have been asymptomatic from these stones until now but given his MRCP findings of obstructing common bile duct stones I agree with ERCP for stone clearance. Surgery will follow along and consider cholecystectomy after his ERCP.  Correct coagulopathy and keep NPO for possible ERCP and cholecystectomy.  Lodema Pilot DAVID 07/01/2013, 3:23 PM

## 2013-07-01 NOTE — Progress Notes (Signed)
Placed pt on CPAP via nasal mask,settings of 10.0cm H20. Pt. Tolerating well at this time. RN aware.

## 2013-07-02 DIAGNOSIS — R748 Abnormal levels of other serum enzymes: Secondary | ICD-10-CM

## 2013-07-02 DIAGNOSIS — D689 Coagulation defect, unspecified: Secondary | ICD-10-CM

## 2013-07-02 DIAGNOSIS — I6350 Cerebral infarction due to unspecified occlusion or stenosis of unspecified cerebral artery: Secondary | ICD-10-CM

## 2013-07-02 LAB — CBC
HCT: 35.2 % — ABNORMAL LOW (ref 39.0–52.0)
Hemoglobin: 11.3 g/dL — ABNORMAL LOW (ref 13.0–17.0)
MCH: 28.5 pg (ref 26.0–34.0)
MCHC: 32.1 g/dL (ref 30.0–36.0)

## 2013-07-02 LAB — PROTIME-INR
INR: 2.33 — ABNORMAL HIGH (ref 0.00–1.49)
Prothrombin Time: 23.4 seconds — ABNORMAL HIGH (ref 11.6–15.2)

## 2013-07-02 LAB — COMPREHENSIVE METABOLIC PANEL
Alkaline Phosphatase: 621 U/L — ABNORMAL HIGH (ref 39–117)
BUN: 15 mg/dL (ref 6–23)
CO2: 26 mEq/L (ref 19–32)
GFR calc Af Amer: 83 mL/min — ABNORMAL LOW (ref >90)
GFR calc non Af Amer: 72 mL/min — ABNORMAL LOW (ref >90)
Glucose, Bld: 80 mg/dL (ref 70–99)
Potassium: 3.6 mEq/L (ref 3.5–5.1)
Total Protein: 5.3 g/dL — ABNORMAL LOW (ref 6.0–8.3)

## 2013-07-02 MED ORDER — PHYTONADIONE 5 MG PO TABS
2.5000 mg | ORAL_TABLET | Freq: Once | ORAL | Status: AC
  Administered 2013-07-02: 2.5 mg via ORAL
  Filled 2013-07-02: qty 1

## 2013-07-02 MED ORDER — PHYTONADIONE 5 MG PO TABS
5.0000 mg | ORAL_TABLET | Freq: Once | ORAL | Status: AC
  Administered 2013-07-02: 5 mg via ORAL
  Filled 2013-07-02: qty 1

## 2013-07-02 NOTE — Progress Notes (Signed)
[facility] Progress Note   Subjective  no abdominal pain. No appetite   Objective   Vital signs in last 24 hours: Temp:  [97.8 F (36.6 C)-99 F (37.2 C)] 97.8 F (36.6 C) (07/28 0534) Pulse Rate:  [55-56] 56 (07/28 0534) Resp:  [20] 20 (07/28 0534) BP: (152-175)/(72-87) 152/72 mmHg (07/28 0752) SpO2:  [98 %-100 %] 98 % (07/28 0534) Last BM Date: 07/01/13 General:    white male in NAD. Very HOH Abdomen:  Soft, nontender and nondistended. Normal bowel sounds. Neurologic:  Alert and oriented,  grossly normal neurologically. Psych:  Cooperative. Normal mood and affect.   Lab Results:  Recent Labs  06/30/13 0500 07/02/13 0430  WBC 9.1 8.6  HGB 10.8* 11.3*  HCT 33.3* 35.2*  PLT 351 338   BMET  Recent Labs  06/30/13 0500 07/02/13 0430  NA 134* 133*  K 4.2 3.6  CL 102 99  CO2 24 26  GLUCOSE 76 80  BUN 16 15  CREATININE 0.86 0.91  CALCIUM 8.1* 8.3*   LFT  Recent Labs  07/02/13 0430  PROT 5.3*  ALBUMIN 1.9*  AST 69*  ALT 64*  ALKPHOS 621*  BILITOT 2.8*   PT/INR  Recent Labs  07/01/13 0511 07/02/13 0430  LABPROT 22.0* 24.8*  INR 1.99* 2.33*     Assessment / Plan:   79year old male with cholelithiasis/ choledocholithiasis / possible cholangitis with leukocytosis on admission. WBC now normal on antibiotics. Bili up from 0.8 to 2.3 today. Unable to proceed with ERCP today secondary to elevated INR. Patient has gotten 5mg  of Vitamin K this am. I am trying to coordinate time with anesthesia to get ERCP done tomorrow. Will given additional Vitamin K today so coagulopathy won't preclude proceeding with ERCP tomorrow.     LOS: 4 days   Willette Cluster  07/02/2013, 8:09 AM   GI ATTENDING  Patient seen and examined. Case reviewed with colleagues in the morning report. Agree with interval history and physical as outlined above. Symptomatic cholelithiasis. Plan ERCP with sphincterotomy and stone extraction when coagulopathy corrected. Examination  has been tentatively set for 1 PM on Wednesday with the assistance of the anesthesia team for general anesthesia. The patient is obviously high risk given his age and comorbidities.  [name]. [name]., M.D. [facility] Division of Gastroenterology

## 2013-07-02 NOTE — Progress Notes (Signed)
Patient ID: [name], male   DOB: 10/18/1919, 79 y.o.   MRN: [id number]    Subjective: Pt says "i feel like a million bucks".  Denies pain/n/v.    Objective: Vital signs in last 24 hours: Temp:  [97.8 F (36.6 C)-99 F (37.2 C)] 97.8 F (36.6 C) (07/28 0534) Pulse Rate:  [55-56] 56 (07/28 0534) Resp:  [20] 20 (07/28 0534) BP: (158-175)/(79-87) 175/79 mmHg (07/28 0534) SpO2:  [98 %-100 %] 98 % (07/28 0534) Last BM Date: 07/01/13  Intake/Output from previous day: 07/27 0701 - 07/28 0700 In: 850.3 [P.O.:240; I.V.:310.3; IV Piggyback:300] Out: 500 [Urine:500] Intake/Output this shift: Total I/O In: -  Out: 175 [Urine:175]  PE: Abd: soft, nontender, +BS General: HOH, awake, alert, NAD  Lab Results:   Recent Labs  06/30/13 0500 07/02/13 0430  WBC 9.1 8.6  HGB 10.8* 11.3*  HCT 33.3* 35.2*  PLT 351 338   BMET  Recent Labs  06/30/13 0500 07/02/13 0430  NA 134* 133*  K 4.2 3.6  CL 102 99  CO2 24 26  GLUCOSE 76 80  BUN 16 15  CREATININE 0.86 0.91  CALCIUM 8.1* 8.3*   PT/INR  Recent Labs  07/01/13 0511 07/02/13 0430  LABPROT 22.0* 24.8*  INR 1.99* 2.33*   CMP     Component Value Date/Time   NA 133* 07/02/2013 0430   K 3.6 07/02/2013 0430   CL 99 07/02/2013 0430   CO2 26 07/02/2013 0430   GLUCOSE 80 07/02/2013 0430   BUN 15 07/02/2013 0430   CREATININE 0.91 07/02/2013 0430   CALCIUM 8.3* 07/02/2013 0430   PROT 5.3* 07/02/2013 0430   ALBUMIN 1.9* 07/02/2013 0430   AST 69* 07/02/2013 0430   ALT 64* 07/02/2013 0430   ALKPHOS 621* 07/02/2013 0430   BILITOT 2.8* 07/02/2013 0430   GFRNONAA 72* 07/02/2013 0430   GFRAA 83* 07/02/2013 0430   Lipase     Component Value Date/Time   LIPASE 18 06/28/2013 2114       Studies/Results: No results found.  Anti-infectives: Anti-infectives   Start     Dose/Rate Route Frequency Ordered Stop   06/29/13 0045  Ampicillin-Sulbactam (UNASYN) 3 g in sodium chloride 0.9 % 100 mL IVPB     3 g 100 mL/hr over 60 Minutes  Intravenous Every 8 hours 06/29/13 0044     06/28/13 2245  ciprofloxacin (CIPRO) IVPB 400 mg  Status:  Discontinued     400 mg 200 mL/hr over 60 Minutes Intravenous  Once 06/28/13 2240 06/28/13 2243   06/28/13 2245  metroNIDAZOLE (FLAGYL) IVPB 500 mg  Status:  Discontinued     500 mg 100 mL/hr over 60 Minutes Intravenous  Once 06/28/13 2240 06/28/13 2243   06/28/13 2245  Ampicillin-Sulbactam (UNASYN) 3 g in sodium chloride 0.9 % 100 mL IVPB     3 g 100 mL/hr over 60 Minutes Intravenous  Once 06/28/13 2243 06/29/13 0009       Assessment/Plan Cholelithiasis and choledocholithiasis with possible cholangitis: on anticoagulation and INR still 2.33, plan was for ERCP today, if they are able to do that today then could potentially plan for lap chole tomorrow.     LOS: 4 days    WHITE, ELIZABETH 07/02/2013

## 2013-07-02 NOTE — Progress Notes (Signed)
Patient interviewed and examined. Agree with care plan as outlined.  Enjoying lunch with gusto. Abdomen benign.  No clinical or radiographic evidence for acute cholecystitis.  For ERCP tomorrow. At age 79, question whether we should proceed with cholecystectomy or treat gallstones expectantly.  [name]. Derrell Lolling, M.D., [facility] General and Minimally invasive Surgery Breast and Colorectal Surgery Office:   [phone] Pager:   [phone]

## 2013-07-02 NOTE — Progress Notes (Signed)
[facility] PROGRESS NOTE  [name] ZOX:[id number] DOB: [birth year] DOA: 06/28/2013 PCP: [name], MD  Brief narrative 79 year old male, with history of A. fib on Coumadin, malignant melanoma, right femoral neck fracture, embolic CVA, OSA, HTN, HL, mechanical fall on 05/29/13, was sent to the [facility] on 06/28/13 with complaints of an episode of vomiting and fever of 103.56F. Patient also gave history of one-2 weeks of decreased appetite but no abdominal pain. In the ED, WBC 24.5, alkaline phosphatase 866 & abdominal ultrasound which showed marked intra-and extrahepatic biliary dietitian without obstructing stone or lesion. He was admitted for further evaluation and management.   Assessment/Plan: 1. Biliary obstruction secondary to choledocholithiasis/cholangitis (elevated alkaline phosphatase, AST & ALT): Followup CT abdomen 06/29/13 shows intra-and extrahepatic bile the dietitian but no cause of obstruction identified. Cholelithiasis without inflammatory changes. Patient asymptomatic of abdominal pain and no further vomiting. [facility]  input appreciated-they plan to reevaluate on Monday to decide next step-? ERCP versus laparoscopic cholecystectomy with IOC. Continue empiric IV Unasyn given presentation with fever and leukocytosis-? Cholangitis-however does not appear toxic or septic. Patient spiked a fever 7/26 night F. Blood cultures -negative to date. ERCP postponed until anticoagulation reversed (INR 2.2-despite a dose of vitamin K 2.5 mg on 7/27). Surgery on board but not sure if he will need cholecystectomy. Diet resumed. 2. Leukocytosis: Possibly secondary to problem #1. Continue empiric IV Unasyn for now. Blood cultures negative to date. Resolved.  3. Anemia: Hemoglobin stable.  4. History of A. fib/chronic Coumadin: INR: 1.9. Currently in sinus rhythm. Does not seem to be on any rate controlled medications at home. Monitor on  telemetry. Echo shows EF 55% & basal, inferior and posterior severe hypokinesis. [facility]  input appreciated-no further workup due to lack of symptoms and overall frailty. Coumadin has been held. Patient will receive additional doses of vitamin K to reverse INR in preparation for ERCP.  5. NSVT: Patient had a run of 7 beat NSVT on 7/25 at 12:10 PM. Echo results as above. Cardiology consultation appreciated. They recommend conservative treatment. They do not recommend beta blockers or calcium channel blockers due to sinus bradycardia on the monitor. No further events on monitor. 6. History of embolic CVA: Therapeutically anticoagulated. Will defer decision regarding continuing versus discontinuing Coumadin to his outpatient him days. Will resume Coumadin when cleared by GI. Management as above. Started Lovenox for DVT prophylaxis. 7. HTN: Controlled. 8. OSA: Not on CPAP.  Code Status: DO NOT RESUSCITATE Family Communication: Discussed with son Mr. Riven Mabile on 7/27.  Disposition Plan: Return to ALF when stable.   Consultants:  [facility]  Procedures:  None  Antibiotics:  IV Unasyn 7/25 >   HPI/Subjective: Denies complaints. Despondent that he couldn't get procedure done today.  Objective: Filed Vitals:   07/01/13 2200 07/02/13 0534 07/02/13 0752 07/02/13 1042  BP: 161/79 175/79 152/72 159/78  Pulse: 56 56    Temp: 99 F (37.2 C) 97.8 F (36.6 C)    TempSrc: Axillary Axillary    Resp: 20 20    Height:      Weight:      SpO2: 100% 98%      Intake/Output Summary (Last 24 hours) at 07/02/13 1725 Last data filed at 07/02/13 1500  Gross per 24 hour  Intake 1060.33 ml  Output    875 ml  Net 185.33 ml   Filed Weights   06/29/13 0311  Weight: 67.6 kg (  149 lb 0.5 oz)    Exam:   General exam: Comfortable. Elderly flail pleasant male. Hard of hearing. Hearing aid in right ear.  Respiratory system: Clear. No increased work of  breathing.  Cardiovascular system: S1 & S2 heard, RRR. No JVD, murmurs, gallops, clicks or pedal edema. Telemetry: Sinus bradycardia in the 50s. No further arrhythmia since 7/25.   Gastrointestinal system: Abdomen is nondistended, soft and nontender. Normal bowel sounds heard.  Central nervous system: Alert and oriented. No focal neurological deficits.  Extremities: Symmetric 5 x 5 power.   Data Reviewed: Basic Metabolic Panel:  Recent Labs Lab 06/28/13 2114 06/29/13 0435 06/29/13 1820 06/30/13 0500 07/02/13 0430  NA 136 134*  --  134* 133*  K 3.6 4.1  --  4.2 3.6  CL 100 98  --  102 99  CO2 25 27  --  24 26  GLUCOSE 111* 119*  --  76 80  BUN 15 16  --  16 15  CREATININE 0.97 1.04  --  0.86 0.91  CALCIUM 8.2* 8.0*  --  8.1* 8.3*  MG  --   --  1.8  --   --    Liver Function Tests:  Recent Labs Lab 06/28/13 2114 06/29/13 0435 06/30/13 0500 07/02/13 0430  AST 60* 66* 48* 69*  ALT 52 55* 44 64*  ALKPHOS 866* 741* 647* 621*  BILITOT 1.1 1.1 0.8 2.8*  PROT 5.8* 5.4* 5.1* 5.3*  ALBUMIN 2.4* 2.2* 1.9* 1.9*    Recent Labs Lab 06/28/13 2114  LIPASE 18   No results found for this basename: AMMONIA,  in the last 168 hours CBC:  Recent Labs Lab 06/28/13 2114 06/29/13 0435 06/30/13 0500 07/02/13 0430  WBC 24.5* 24.5* 9.1 8.6  NEUTROABS 22.8*  --   --   --   HGB 11.2* 10.7* 10.8* 11.3*  HCT 34.3* 33.4* 33.3* 35.2*  MCV 89.1 89.8 90.2 88.9  PLT 345 388 351 338   Cardiac Enzymes: No results found for this basename: CKTOTAL, CKMB, CKMBINDEX, TROPONINI,  in the last 168 hours BNP (last 3 results) No results found for this basename: PROBNP,  in the last 8760 hours CBG: No results found for this basename: GLUCAP,  in the last 168 hours  Recent Results (from the past 240 hour(s))  MRSA PCR SCREENING     Status: None   Collection Time    06/29/13  3:09 AM      Result Value Range Status   MRSA by PCR NEGATIVE  NEGATIVE Final   Comment:            The  GeneXpert MRSA Assay (FDA     approved for NASAL specimens     only), is one component of a     comprehensive MRSA colonization     surveillance program. It is not     intended to diagnose MRSA     infection nor to guide or     monitor treatment for     MRSA infections.  CULTURE, BLOOD (ROUTINE X 2)     Status: None   Collection Time    06/30/13 10:25 PM      Result Value Range Status   Specimen Description BLOOD LEFT HAND   Final   Special Requests BOTTLES DRAWN AEROBIC AND ANAEROBIC 10CC   Final   Culture  Setup Time 07/01/2013 01:45   Final   Culture     Final   Value:  BLOOD CULTURE RECEIVED NO GROWTH TO DATE CULTURE WILL BE HELD FOR 5 DAYS BEFORE ISSUING A FINAL NEGATIVE REPORT   Report Status PENDING   Incomplete  CULTURE, BLOOD (ROUTINE X 2)     Status: None   Collection Time    06/30/13 10:29 PM      Result Value Range Status   Specimen Description BLOOD RIGHT HAND   Final   Special Requests BOTTLES DRAWN AEROBIC ONLY 10CC   Final   Culture  Setup Time 07/01/2013 01:45   Final   Culture     Final   Value:        BLOOD CULTURE RECEIVED NO GROWTH TO DATE CULTURE WILL BE HELD FOR 5 DAYS BEFORE ISSUING A FINAL NEGATIVE REPORT   Report Status PENDING   Incomplete     Studies: No results found.   Additional labs:   Scheduled Meds: . ampicillin-sulbactam (UNASYN) IV  3 g Intravenous Q8H  . enoxaparin (LOVENOX) injection  40 mg Subcutaneous Q24H  . famotidine  20 mg Oral QHS  . furosemide  20 mg Oral Daily  . pantoprazole  40 mg Oral Daily  . sodium chloride  3 mL Intravenous Q12H   Continuous Infusions: . sodium chloride 20 mL/hr (07/01/13 1330)    Principal Problem:   Choledocholithiasis with obstruction Active Problems:   HYPERLIPIDEMIA   BLINDNESS, ONE EYE   SINUS BRADYCARDIA   Personal history of malignant melanoma of skin   CVA (cerebral vascular accident)   Common bile duct dilation   Protein-calorie malnutrition, severe   Abnormal  echocardiogram   Ventricular tachycardia, non-sustained   Atrial fibrillation    Time spent: 25 minutes.    [facility] [employer]  [facility] Pager [phone].   If 8PM-8AM, please contact night-coverage at [email or web address], password [email or web address] 07/02/2013, 5:25 PM  LOS: 4 days

## 2013-07-02 NOTE — Progress Notes (Signed)
PROGRESS NOTE  Subjective:   [name] has a hx of HTN, atrial fib, and CVA  and was admitted with fever and vomiting.  He has been found to have choledocholithiasis.  His echo shows an overall normal LV function with inferior basal severe hypokinesis.    He does not recall having any previous cardiac problems.    Objective:    Vital Signs:   Temp:  [97.8 F (36.6 C)-99 F (37.2 C)] 97.8 F (36.6 C) (07/28 0534) Pulse Rate:  [55-56] 56 (07/28 0534) Resp:  [20] 20 (07/28 0534) BP: (158-175)/(79-87) 175/79 mmHg (07/28 0534) SpO2:  [98 %-100 %] 98 % (07/28 0534)  Last BM Date: 07/01/13   24-hour weight change: Weight change:   Weight trends: Filed Weights   06/29/13 0311  Weight: 149 lb 0.5 oz (67.6 kg)    Intake/Output:  07/27 0701 - 07/28 0700 In: 850.3 [P.O.:240; I.V.:310.3; IV Piggyback:300] Out: 500 [Urine:500]     Physical Exam: BP 175/79  Pulse 56  Temp(Src) 97.8 F (36.6 C) (Axillary)  Resp 20  Ht 5\' 2"  (1.575 m)  Wt 149 lb 0.5 oz (67.6 kg)  BMI 27.25 kg/m2  SpO2 98%  General: Vital signs reviewed and noted.   Head: Normocephalic, atraumatic.  Eyes: conjunctivae/corneas clear.  EOM's intact.   Throat: normal  Neck:  normal   Lungs:    clear  Heart:  RR, normal S1, S2  Abdomen:  Soft, non-tender, non-distended    Extremities: No edema   Neurologic: A&O X3,   Hard of hearing. Right arm is flexed   Psych: Normal     Labs: BMET:  Recent Labs  06/29/13 1820 06/30/13 0500 07/02/13 0430  NA  --  134* 133*  K  --  4.2 3.6  CL  --  102 99  CO2  --  24 26  GLUCOSE  --  76 80  BUN  --  16 15  CREATININE  --  0.86 0.91  CALCIUM  --  8.1* 8.3*  MG 1.8  --   --     Liver function tests:  Recent Labs  06/30/13 0500 07/02/13 0430  AST 48* 69*  ALT 44 64*  ALKPHOS 647* 621*  BILITOT 0.8 2.8*  PROT 5.1* 5.3*  ALBUMIN 1.9* 1.9*   No results found for this basename: LIPASE, AMYLASE,  in the last 72 hours  CBC:  Recent Labs   06/30/13 0500 07/02/13 0430  WBC 9.1 8.6  HGB 10.8* 11.3*  HCT 33.3* 35.2*  MCV 90.2 88.9  PLT 351 338    Cardiac Enzymes: No results found for this basename: CKTOTAL, CKMB, TROPONINI,  in the last 72 hours  Coagulation Studies:  Recent Labs  06/30/13 0500 07/01/13 0511 07/02/13 0430  LABPROT 23.7* 22.0* 24.8*  INR 2.20* 1.99* 2.33*    Other: No components found with this basename: POCBNP,  No results found for this basename: DDIMER,  in the last 72 hours No results found for this basename: HGBA1C,  in the last 72 hours No results found for this basename: CHOL, HDL, LDLCALC, TRIG, CHOLHDL,  in the last 72 hours No results found for this basename: TSH, T4TOTAL, FREET3, T3FREE, THYROIDAB,  in the last 72 hours No results found for this basename: VITAMINB12, FOLATE, FERRITIN, TIBC, IRON, RETICCTPCT,  in the last 72 hours   Other results:  Tele:  NSR ( small P waves)   Medications:    Infusions: . sodium chloride 20 mL/hr (07/01/13  1330)    Scheduled Medications: . ampicillin-sulbactam (UNASYN) IV  3 g Intravenous Q8H  . enoxaparin (LOVENOX) injection  40 mg Subcutaneous Q24H  . famotidine  20 mg Oral QHS  . furosemide  20 mg Oral Daily  . pantoprazole  40 mg Oral Daily  . sodium chloride  3 mL Intravenous Q12H    Assessment/ Plan:   Principal Problem:   Choledocholithiasis with obstruction Active Problems:   HYPERLIPIDEMIA   BLINDNESS, ONE EYE   SINUS BRADYCARDIA   Personal history of malignant melanoma of skin   CVA (cerebral vascular accident)   Common bile duct dilation   Protein-calorie malnutrition, severe   Abnormal echocardiogram   Ventricular tachycardia, non-sustained   Atrial fibrillation  1. Pre-op eval. Patient is doing well.  He should be at no additional risk for ERCP from a cardiac standpoint ( over and beyond the risk associated with a typical  79 yo)  2. Paroxysmal atrial fib.:  Currently in NSR.  Agree with plans for  anticoagulation.  Currently , his INR is elevated ( not on coumadin)  No further recs at this time.   Disposition:  Length of Stay: 4  Vesta Mixer, Montez Hageman., MD, [facility] 07/02/2013, 7:20 AM Office [phone] Pager [phone]

## 2013-07-02 NOTE — Progress Notes (Signed)
Patient did not want to wear his CPAP tonight.RN aware and asked her to let me know if he changed his mind.

## 2013-07-03 LAB — PROTIME-INR: INR: 2.18 — ABNORMAL HIGH (ref 0.00–1.49)

## 2013-07-03 LAB — ABO/RH: ABO/RH(D): O NEG

## 2013-07-03 MED ORDER — VITAMIN K1 10 MG/ML IJ SOLN
4.0000 mg | Freq: Once | INTRAVENOUS | Status: AC
  Administered 2013-07-03: 4 mg via INTRAVENOUS
  Filled 2013-07-03: qty 0.4

## 2013-07-03 MED ORDER — VITAMIN K1 10 MG/ML IJ SOLN
5.0000 mg | Freq: Once | INTRAMUSCULAR | Status: DC
  Filled 2013-07-03: qty 0.5

## 2013-07-03 MED ORDER — VITAMIN K1 10 MG/ML IJ SOLN
1.0000 mg | Freq: Once | INTRAVENOUS | Status: AC
  Administered 2013-07-03: 1 mg via INTRAVENOUS
  Filled 2013-07-03: qty 0.1

## 2013-07-03 MED ORDER — VITAMIN K1 10 MG/ML IJ SOLN
10.0000 mg | Freq: Once | INTRAMUSCULAR | Status: DC
  Filled 2013-07-03: qty 1

## 2013-07-03 NOTE — Progress Notes (Signed)
Patient ID: [name], male   DOB: 01-23-1919, 79 y.o.   MRN: [id number]    Subjective: Pt says he feels fine, Denies pain/n/v.    Objective: Vital signs in last 24 hours: Temp:  [98.2 F (36.8 C)-98.6 F (37 C)] 98.6 F (37 C) (07/29 0510) Pulse Rate:  [56-60] 56 (07/29 0510) Resp:  [18-20] 20 (07/29 0510) BP: (146-171)/(77-86) 146/77 mmHg (07/29 0510) SpO2:  [97 %-98 %] 97 % (07/29 0510) Last BM Date: 07/02/13  Intake/Output from previous day: 07/28 0701 - 07/29 0700 In: 880 [P.O.:480; I.V.:200; IV Piggyback:200] Out: 1125 [Urine:1125] Intake/Output this shift:    PE: Abd: soft, nontender, +BS General: HOH, awake, alert, NAD  Lab Results:   Recent Labs  07/02/13 0430  WBC 8.6  HGB 11.3*  HCT 35.2*  PLT 338   BMET  Recent Labs  07/02/13 0430  NA 133*  K 3.6  CL 99  CO2 26  GLUCOSE 80  BUN 15  CREATININE 0.91  CALCIUM 8.3*   PT/INR  Recent Labs  07/02/13 1710 07/03/13 0427  LABPROT 23.4* 23.6*  INR 2.16* 2.18*   CMP     Component Value Date/Time   NA 133* 07/02/2013 0430   K 3.6 07/02/2013 0430   CL 99 07/02/2013 0430   CO2 26 07/02/2013 0430   GLUCOSE 80 07/02/2013 0430   BUN 15 07/02/2013 0430   CREATININE 0.91 07/02/2013 0430   CALCIUM 8.3* 07/02/2013 0430   PROT 5.3* 07/02/2013 0430   ALBUMIN 1.9* 07/02/2013 0430   AST 69* 07/02/2013 0430   ALT 64* 07/02/2013 0430   ALKPHOS 621* 07/02/2013 0430   BILITOT 2.8* 07/02/2013 0430   GFRNONAA 72* 07/02/2013 0430   GFRAA 83* 07/02/2013 0430   Lipase     Component Value Date/Time   LIPASE 18 06/28/2013 2114       Studies/Results: No results found.  Anti-infectives: Anti-infectives   Start     Dose/Rate Route Frequency Ordered Stop   06/29/13 0045  Ampicillin-Sulbactam (UNASYN) 3 g in sodium chloride 0.9 % 100 mL IVPB     3 g 100 mL/hr over 60 Minutes Intravenous Every 8 hours 06/29/13 0044     06/28/13 2245  ciprofloxacin (CIPRO) IVPB 400 mg  Status:  Discontinued     400 mg 200 mL/hr  over 60 Minutes Intravenous  Once 06/28/13 2240 06/28/13 2243   06/28/13 2245  metroNIDAZOLE (FLAGYL) IVPB 500 mg  Status:  Discontinued     500 mg 100 mL/hr over 60 Minutes Intravenous  Once 06/28/13 2240 06/28/13 2243   06/28/13 2245  Ampicillin-Sulbactam (UNASYN) 3 g in sodium chloride 0.9 % 100 mL IVPB     3 g 100 mL/hr over 60 Minutes Intravenous  Once 06/28/13 2243 06/29/13 0009       Assessment/Plan Cholelithiasis and choledocholithiasis with possible cholangitis: on anticoagulation and INR still 218, plan was for ERCP today but given INR prob will have to wait until tomorrow, we will give 2 units of FFP this am and try to reverse, spoke with cards and they agree with this plan.   LOS: 5 days    WHITE, ELIZABETH 07/03/2013

## 2013-07-03 NOTE — Progress Notes (Signed)
Pt has refused CPAP for the night, RT to monitor and assess as needed.  

## 2013-07-03 NOTE — Progress Notes (Signed)
General surgery attending note:  Patient remains asymptomatic. Denies nausea or abdominal pain. Excellent abdomen is soft and nontender Total bilirubin back up to 2.8. INR still 2.18.  He is going to receive vitamin K again and also fresh frozen plasma. Hopefully ERCP can be done tomorrow Surgically, he is at increased risk for cholecystectomy, , and although there is no guarantee that he will not get acute cholecystitis in the future, it is reasonable to contemplate treating the gallstones with observation only. We'll continue to follow with you.   Angelia Mould. Derrell Lolling, M.D., [facility] General and Minimally invasive Surgery Breast and Colorectal Surgery Office:   [phone] Pager:   [phone]

## 2013-07-03 NOTE — Progress Notes (Signed)
PROGRESS NOTE  Subjective:   [name] has a hx of HTN, atrial fib, and CVA  and was admitted with fever and vomiting.  He has been found to have choledocholithiasis.  His echo shows an overall normal LV function with inferior basal severe hypokinesis.    He does not recall having any previous cardiac problems.    Objective:    Vital Signs:   Temp:  [98.2 F (36.8 C)-98.6 F (37 C)] 98.6 F (37 C) (07/29 0510) Pulse Rate:  [56-60] 56 (07/29 0510) Resp:  [18-20] 20 (07/29 0510) BP: (146-171)/(72-86) 146/77 mmHg (07/29 0510) SpO2:  [97 %-98 %] 97 % (07/29 0510)  Last BM Date: 07/02/13   24-hour weight change: Weight change:   Weight trends: Filed Weights   06/29/13 0311  Weight: 149 lb 0.5 oz (67.6 kg)    Intake/Output:  07/28 0701 - 07/29 0700 In: 880 [P.O.:480; I.V.:200; IV Piggyback:200] Out: 1125 [Urine:1125]     Physical Exam: BP 146/77  Pulse 56  Temp(Src) 98.6 F (37 C) (Oral)  Resp 20  Ht 5\' 2"  (1.575 m)  Wt 149 lb 0.5 oz (67.6 kg)  BMI 27.25 kg/m2  SpO2 97%  General: Vital signs reviewed and noted.   Head: Normocephalic, atraumatic.  Eyes: conjunctivae/corneas clear.  EOM's intact.   Throat: normal  Neck:  normal   Lungs:  clear  Heart:  RR, normal S1, S2  Abdomen:  Soft, non-tender, non-distended    Extremities: No edema   Neurologic: A&O X3,   Hard of hearing. Right arm is flexed   Psych: Normal     Labs: BMET:  Recent Labs  07/02/13 0430  NA 133*  K 3.6  CL 99  CO2 26  GLUCOSE 80  BUN 15  CREATININE 0.91  CALCIUM 8.3*    Liver function tests:  Recent Labs  07/02/13 0430  AST 69*  ALT 64*  ALKPHOS 621*  BILITOT 2.8*  PROT 5.3*  ALBUMIN 1.9*   No results found for this basename: LIPASE, AMYLASE,  in the last 72 hours  CBC:  Recent Labs  07/02/13 0430  WBC 8.6  HGB 11.3*  HCT 35.2*  MCV 88.9  PLT 338    Cardiac Enzymes: No results found for this basename: CKTOTAL, CKMB, TROPONINI,  in the last 72  hours  Coagulation Studies:  Recent Labs  07/01/13 0511 07/02/13 0430 07/02/13 1710 07/03/13 0427  LABPROT 22.0* 24.8* 23.4* 23.6*  INR 1.99* 2.33* 2.16* 2.18*      Other results:  Tele:  Sinus brady at 58  ( small P waves)   Medications:    Infusions: . sodium chloride 20 mL/hr (07/01/13 1330)    Scheduled Medications: . ampicillin-sulbactam (UNASYN) IV  3 g Intravenous Q8H  . enoxaparin (LOVENOX) injection  40 mg Subcutaneous Q24H  . famotidine  20 mg Oral QHS  . furosemide  20 mg Oral Daily  . pantoprazole  40 mg Oral Daily  . phytonadione (VITAMIN K) IV  10 mg Intravenous Once  . sodium chloride  3 mL Intravenous Q12H    Assessment/ Plan:   Principal Problem:   Choledocholithiasis with obstruction Active Problems:   HYPERLIPIDEMIA   BLINDNESS, ONE EYE   SINUS BRADYCARDIA   Personal history of malignant melanoma of skin   CVA (cerebral vascular accident)   Common bile duct dilation   Protein-calorie malnutrition, severe   Abnormal echocardiogram   Ventricular tachycardia, non-sustained   Atrial fibrillation  1. Pre-op eval. Patient  is doing well.  He should be at no additional risk for ERCP from a cardiac standpoint ( over and beyond the risk associated with a typical  79 yo)  2. Paroxysmal atrial fib.:  Currently in NSR.  Agree with plans for anticoagulation following ERCP and any other procedures Currently , his INR is elevated.   Coumadin has been held.  Discussed with surgery PA.  I agree with plans for FFP in order to possibly do ERCP today.  No further recs at this time.   Disposition:  Length of Stay: 5  Vesta Mixer, Montez Hageman., MD, [place] 07/03/2013, 7:34 AM Office [phone] Pager [phone]

## 2013-07-03 NOTE — Progress Notes (Signed)
[facility] PROGRESS NOTE  [name] ZOX:[id number] DOB: Dec 11, 1918 DOA: 06/28/2013 PCP: [name] Hughs, MD  Brief narrative 79 year old male, with history of A. fib on Coumadin, malignant melanoma, right femoral neck fracture, embolic CVA, OSA, HTN, HL, mechanical fall on 05/29/13, was sent to the [facility] ED from [facility] on 06/28/13 with complaints of an episode of vomiting and fever of 103.67F. Patient also gave history of one-2 weeks of decreased appetite but no abdominal pain. In the ED, WBC 24.5, alkaline phosphatase 866 & abdominal ultrasound which showed marked intra-and extrahepatic biliary dietitian without obstructing stone or lesion. He was admitted for further evaluation and management. He was diagnosed to have biliary obstruction secondary to gallstones and possible cholangitis. Awaiting ERCP when coagulopathy has been reversed. Cardiology seen for an episode of NSVT-managing conservatively. Difficulty reversing anticoagulation.  Assessment/Plan: 1. Biliary obstruction secondary to choledocholithiasis/cholangitis (elevated alkaline phosphatase, AST & ALT): Followup CT abdomen 06/29/13 shows intra-and extrahepatic bile the dietitian but no cause of obstruction identified. Cholelithiasis without inflammatory changes. Patient asymptomatic of abdominal pain and no further vomiting. Continue empiric IV Unasyn given presentation with fever and leukocytosis-? Cholangitis-however does not appear toxic or septic. Patient spiked a fever 7/26 night. Blood cultures -negative to date. ERCP postponed to 7/30 until anticoagulation reversed-INR elevated greater than 2 despite 7.5 mg of vitamin K on 7/28. Received IV vitamin K 5 mg x1 and FFP today. Surgery on board but not sure if he will need cholecystectomy. Diet resumed. 2. Coagulopathy: Secondary to Coumadin-held since admission. Management per problem #1. 3. Leukocytosis: Possibly secondary to problem #1. Continue  empiric IV Unasyn for now. Blood cultures negative to date. Resolved.  4. Anemia: Hemoglobin stable.  5. History of A. fib/chronic Coumadin: INR: 1.9. Currently in sinus rhythm. Does not seem to be on any rate controlled medications at home. Monitor on telemetry. Echo shows EF 55% & basal, inferior and posterior severe hypokinesis. [place] cardiology  input appreciated-no further workup due to lack of symptoms and overall frailty. Coumadin has been held. Patient will receive additional doses of vitamin K to reverse INR in preparation for ERCP.  6. NSVT: Patient had a run of 7 beat NSVT on 7/25 at 12:10 PM. Echo results as above. Cardiology consultation appreciated. They recommend conservative treatment. They do not recommend beta blockers or calcium channel blockers due to sinus bradycardia on the monitor. No further events on monitor. 7. History of embolic CVA: Therapeutically anticoagulated. Will defer decision regarding continuing versus discontinuing Coumadin to his outpatient him days. Will resume Coumadin when cleared by GI. Management as above. Started Lovenox for DVT prophylaxis. 8. HTN: Controlled. 9. OSA: Not on CPAP.  Code Status: DO NOT RESUSCITATE Family Communication: Discussed with son [name] on 7/27.  Disposition Plan: Return to ALF when stable.   Consultants:  [name]  Procedures:  None  Antibiotics:  IV Unasyn 7/25 >   HPI/Subjective: Denies complaints. Despondent that his procedure is being repeatedly postponed.  Objective: Filed Vitals:   07/03/13 1135 07/03/13 1218 07/03/13 1254 07/03/13 1354  BP: 165/83 176/82 167/81 147/83  Pulse: 55 56 55 59  Temp: 97.7 F (36.5 C) 98.1 F (36.7 C) 98 F (36.7 C) 98 F (36.7 C)  TempSrc: Oral Oral Oral Oral  Resp: 20 16 16 18   Height:      Weight:      SpO2: 98% 98%      Intake/Output Summary (Last 24 hours) at 07/03/13  1626 Last data filed at 07/03/13 1443  Gross per 24 hour   Intake   1545 ml  Output   1200 ml  Net    345 ml   Filed Weights   06/29/13 0311  Weight: 67.6 kg (149 lb 0.5 oz)    Exam:   General exam: Comfortable. Elderly flail pleasant male. Hard of hearing. Hearing aid in right ear.  Respiratory system: Clear. No increased work of breathing.  Cardiovascular system: S1 & S2 heard, RRR. No JVD, murmurs, gallops, clicks or pedal edema. Telemetry: Sinus bradycardia in the 50s. No further arrhythmia since 7/25.   Gastrointestinal system: Abdomen is nondistended, soft and nontender. Normal bowel sounds heard.  Central nervous system: Alert and oriented. No focal neurological deficits.  Extremities: Symmetric 5 x 5 power.   Data Reviewed: Basic Metabolic Panel:  Recent Labs Lab 06/28/13 2114 06/29/13 0435 06/29/13 1820 06/30/13 0500 07/02/13 0430  NA 136 134*  --  134* 133*  K 3.6 4.1  --  4.2 3.6  CL 100 98  --  102 99  CO2 25 27  --  24 26  GLUCOSE 111* 119*  --  76 80  BUN 15 16  --  16 15  CREATININE 0.97 1.04  --  0.86 0.91  CALCIUM 8.2* 8.0*  --  8.1* 8.3*  MG  --   --  1.8  --   --    Liver Function Tests:  Recent Labs Lab 06/28/13 2114 06/29/13 0435 06/30/13 0500 07/02/13 0430  AST 60* 66* 48* 69*  ALT 52 55* 44 64*  ALKPHOS 866* 741* 647* 621*  BILITOT 1.1 1.1 0.8 2.8*  PROT 5.8* 5.4* 5.1* 5.3*  ALBUMIN 2.4* 2.2* 1.9* 1.9*    Recent Labs Lab 06/28/13 2114  LIPASE 18   No results found for this basename: AMMONIA,  in the last 168 hours CBC:  Recent Labs Lab 06/28/13 2114 06/29/13 0435 06/30/13 0500 07/02/13 0430  WBC 24.5* 24.5* 9.1 8.6  NEUTROABS 22.8*  --   --   --   HGB 11.2* 10.7* 10.8* 11.3*  HCT 34.3* 33.4* 33.3* 35.2*  MCV 89.1 89.8 90.2 88.9  PLT 345 388 351 338   Cardiac Enzymes: No results found for this basename: CKTOTAL, CKMB, CKMBINDEX, TROPONINI,  in the last 168 hours BNP (last 3 results) No results found for this basename: PROBNP,  in the last 8760 hours CBG: No results  found for this basename: GLUCAP,  in the last 168 hours  Recent Results (from the past 240 hour(s))  MRSA PCR SCREENING     Status: None   Collection Time    06/29/13  3:09 AM      Result Value Range Status   MRSA by PCR NEGATIVE  NEGATIVE Final   Comment:            The GeneXpert MRSA Assay (FDA     approved for NASAL specimens     only), is one component of a     comprehensive MRSA colonization     surveillance program. It is not     intended to diagnose MRSA     infection nor to guide or     monitor treatment for     MRSA infections.  CULTURE, BLOOD (ROUTINE X 2)     Status: None   Collection Time    06/30/13 10:25 PM      Result Value Range Status   Specimen Description BLOOD LEFT HAND  Final   Special Requests BOTTLES DRAWN AEROBIC AND ANAEROBIC 10CC   Final   Culture  Setup Time 07/01/2013 01:45   Final   Culture     Final   Value:        BLOOD CULTURE RECEIVED NO GROWTH TO DATE CULTURE WILL BE HELD FOR 5 DAYS BEFORE ISSUING A FINAL NEGATIVE REPORT   Report Status PENDING   Incomplete  CULTURE, BLOOD (ROUTINE X 2)     Status: None   Collection Time    06/30/13 10:29 PM      Result Value Range Status   Specimen Description BLOOD RIGHT HAND   Final   Special Requests BOTTLES DRAWN AEROBIC ONLY 10CC   Final   Culture  Setup Time 07/01/2013 01:45   Final   Culture     Final   Value:        BLOOD CULTURE RECEIVED NO GROWTH TO DATE CULTURE WILL BE HELD FOR 5 DAYS BEFORE ISSUING A FINAL NEGATIVE REPORT   Report Status PENDING   Incomplete     Studies: No results found.   Additional labs:   Scheduled Meds: . ampicillin-sulbactam (UNASYN) IV  3 g Intravenous Q8H  . famotidine  20 mg Oral QHS  . furosemide  20 mg Oral Daily  . pantoprazole  40 mg Oral Daily  . phytonadione (VITAMIN K) IV  4 mg Intravenous Once  . sodium chloride  3 mL Intravenous Q12H   Continuous Infusions: . sodium chloride 20 mL/hr at 07/03/13 0700    Principal Problem:    Choledocholithiasis with obstruction Active Problems:   HYPERLIPIDEMIA   BLINDNESS, ONE EYE   SINUS BRADYCARDIA   Personal history of malignant melanoma of skin   CVA (cerebral vascular accident)   Common bile duct dilation   Protein-calorie malnutrition, severe   Abnormal echocardiogram   Ventricular tachycardia, non-sustained   Atrial fibrillation    Time spent: 25 minutes.    [facility]  [facility] Pager [phone].   If 8PM-8AM, please contact night-coverage at [email or web address], password [facility] 07/03/2013, 4:26 PM  LOS: 5 days

## 2013-07-03 NOTE — Progress Notes (Signed)
[facility] Progress Note   Subjective  no complaints today. No appetite    Objective   Vital signs in last 24 hours: Temp:  [98 F (36.7 C)-98.6 F (37 C)] 98 F (36.7 C) (07/29 1035) Pulse Rate:  [55-60] 55 (07/29 1035) Resp:  [18-20] 18 (07/29 1035) BP: (146-171)/(73-86) 157/73 mmHg (07/29 1035) SpO2:  [97 %-98 %] 98 % (07/29 1012) Last BM Date: 07/02/13 General:   pleasant white male in NAD Heart:  Regular rate and rhythm Abdomen:  Soft, nontender and nondistended. Normal bowel sounds. Extremities:  Without edema. Psych:  Cooperative. Normal mood and affect.  Lab Results:  Recent Labs  07/02/13 0430  WBC 8.6  HGB 11.3*  HCT 35.2*  PLT 338   BMET  Recent Labs  07/02/13 0430  NA 133*  K 3.6  CL 99  CO2 26  GLUCOSE 80  BUN 15  CREATININE 0.91  CALCIUM 8.3*   LFT  Recent Labs  07/02/13 0430  PROT 5.3*  ALBUMIN 1.9*  AST 69*  ALT 64*  ALKPHOS 621*  BILITOT 2.8*   PT/INR  Recent Labs  07/02/13 1710 07/03/13 0427  LABPROT 23.4* 23.6*  INR 2.16* 2.18*      Assessment / Plan:   79year old male with cholelithiasis/ choledocholithiasis / possible cholangitis with leukocytosis on admission. WBC now normal on antibiotics. Still trying to get INR down so ERCP can be done. INR down to 2.16 after more Vitamin K yesterday. He is getting FFP right now which should transiently reverse coagulopathy. He will need more Vitamin K, will give 5mg  IV now. Check INR at 5am. ERCP scheduled for tomorrow.  Surgery planning for cholecystectomy. Next Lovenox injection due at 6pm tonight. Will d/c Lovenox in preparation for ERCP, we will resume following procedure. Patient getting scheduled antibiotics so need for pre-procedure antibiotics.     LOS: 5 days   Willette Cluster  07/03/2013, 11:15 AM   GI ATTENDING  Patient seen and examined. Interval history and laboratories as well as exam reviewed. Agree with above. Appreciate cardiology clearance. Still  with coagulopathy. Would give IV vitamin K. Tentatively set up for ERCP tomorrow 1 PM with general anesthesia.The nature of the procedure, as well as the risks, benefits, and alternatives were carefully and thoroughly reviewed with the patient. Ample time for discussion and questions allowed. The patient understood, was satisfied, and agreed to proceed.  [name]. [name]., M.D. [facility] Gastroenterology

## 2013-07-03 NOTE — Progress Notes (Signed)
Assumed patient care, agree with previous assessment. Darien Ramus, RN

## 2013-07-04 ENCOUNTER — Inpatient Hospital Stay (HOSPITAL_COMMUNITY): Payer: Medicare Other

## 2013-07-04 ENCOUNTER — Encounter (HOSPITAL_COMMUNITY): Payer: Self-pay

## 2013-07-04 ENCOUNTER — Encounter (HOSPITAL_COMMUNITY)
Admission: EM | Disposition: A | Discharge status: Skilled Nursing Facility | Payer: Self-pay | Source: Home / Self Care | Attending: Internal Medicine

## 2013-07-04 ENCOUNTER — Encounter (HOSPITAL_COMMUNITY): Payer: Self-pay | Admitting: Anesthesiology

## 2013-07-04 ENCOUNTER — Inpatient Hospital Stay (HOSPITAL_COMMUNITY): Payer: Medicare Other | Admitting: Anesthesiology

## 2013-07-04 HISTORY — PX: ERCP: SHX5425

## 2013-07-04 LAB — PREPARE FRESH FROZEN PLASMA
Unit division: 0
Unit division: 0

## 2013-07-04 LAB — PROTIME-INR: Prothrombin Time: 12.6 seconds (ref 11.6–15.2)

## 2013-07-04 SURGERY — ERCP, WITH INTERVENTION IF INDICATED
Anesthesia: General

## 2013-07-04 MED ORDER — FENTANYL CITRATE 0.05 MG/ML IJ SOLN: 25.0000 ug | INTRAMUSCULAR | Status: DC | PRN

## 2013-07-04 MED ORDER — WARFARIN SODIUM 5 MG PO TABS
5.0000 mg | ORAL_TABLET | Freq: Once | ORAL | Status: AC
  Administered 2013-07-04: 5 mg via ORAL
  Filled 2013-07-04: qty 1

## 2013-07-04 MED ORDER — PROPOFOL 10 MG/ML IV BOLUS
INTRAVENOUS | Status: DC | PRN
  Administered 2013-07-04: 140 mg via INTRAVENOUS

## 2013-07-04 MED ORDER — LIDOCAINE HCL (CARDIAC) 20 MG/ML IV SOLN
INTRAVENOUS | Status: DC | PRN
  Administered 2013-07-04: 50 mg via INTRAVENOUS

## 2013-07-04 MED ORDER — GLUCAGON HCL (RDNA) 1 MG IJ SOLR
INTRAMUSCULAR | Status: DC | PRN
  Administered 2013-07-04: .5 mg via INTRAVENOUS

## 2013-07-04 MED ORDER — GLUCAGON HCL (RDNA) 1 MG IJ SOLR
INTRAMUSCULAR | Status: AC
  Filled 2013-07-04: qty 2

## 2013-07-04 MED ORDER — EPHEDRINE SULFATE 50 MG/ML IJ SOLN
INTRAMUSCULAR | Status: DC | PRN
  Administered 2013-07-04: 5 mg via INTRAVENOUS

## 2013-07-04 MED ORDER — SUCCINYLCHOLINE CHLORIDE 20 MG/ML IJ SOLN
INTRAMUSCULAR | Status: DC | PRN
  Administered 2013-07-04: 100 mg via INTRAVENOUS

## 2013-07-04 MED ORDER — FENTANYL CITRATE 0.05 MG/ML IJ SOLN
INTRAMUSCULAR | Status: DC | PRN
  Administered 2013-07-04 (×2): 50 ug via INTRAVENOUS

## 2013-07-04 MED ORDER — INDOMETHACIN 50 MG RE SUPP
100.0000 mg | Freq: Once | RECTAL | Status: AC
  Administered 2013-07-04: 100 mg via RECTAL
  Filled 2013-07-04: qty 2

## 2013-07-04 MED ORDER — ENOXAPARIN SODIUM 40 MG/0.4ML ~~LOC~~ SOLN
40.0000 mg | SUBCUTANEOUS | Status: DC
  Administered 2013-07-04: 40 mg via SUBCUTANEOUS
  Filled 2013-07-04 (×2): qty 0.4

## 2013-07-04 MED ORDER — LACTATED RINGERS IV SOLN
INTRAVENOUS | Status: DC
  Administered 2013-07-04 (×2): via INTRAVENOUS

## 2013-07-04 MED ORDER — SODIUM CHLORIDE 0.9 % IV SOLN
INTRAVENOUS | Status: DC | PRN
  Administered 2013-07-04: 15:00:00

## 2013-07-04 MED ORDER — LACTATED RINGERS IV SOLN: INTRAVENOUS | Status: DC

## 2013-07-04 MED ORDER — WARFARIN - PHARMACIST DOSING INPATIENT: Freq: Every day | Status: DC

## 2013-07-04 NOTE — Progress Notes (Signed)
Patient ID: [name], male   DOB: 1919-05-24, 79 y.o.   MRN: [id number]    Subjective: Pt says he feels fine, Denies pain/n/v.  Would be very interested in going home tomorrow and following up as an outpatient for his gallbladder.  Objective: Vital signs in last 24 hours: Temp:  [97.7 F (36.5 C)-98.5 F (36.9 C)] 98.3 F (36.8 C) (07/30 0449) Pulse Rate:  [54-59] 54 (07/30 0449) Resp:  [16-20] 20 (07/30 0449) BP: (145-176)/(73-88) 156/88 mmHg (07/30 0449) SpO2:  [96 %-98 %] 96 % (07/30 0449) Last BM Date: 07/02/13  Intake/Output from previous day: 07/29 0701 - 07/30 0700 In: 1225 [P.O.:240; I.V.:290; Blood:645; IV Piggyback:50] Out: 1550 [Urine:1550] Intake/Output this shift:    PE: Abd: soft, nontender, +BS General: HOH, awake, alert, NAD  Lab Results:   Recent Labs  07/02/13 0430  WBC 8.6  HGB 11.3*  HCT 35.2*  PLT 338   BMET  Recent Labs  07/02/13 0430  NA 133*  K 3.6  CL 99  CO2 26  GLUCOSE 80  BUN 15  CREATININE 0.91  CALCIUM 8.3*   PT/INR  Recent Labs  07/03/13 0427 07/04/13 0443  LABPROT 23.6* 12.6  INR 2.18* 0.96   CMP     Component Value Date/Time   NA 133* 07/02/2013 0430   K 3.6 07/02/2013 0430   CL 99 07/02/2013 0430   CO2 26 07/02/2013 0430   GLUCOSE 80 07/02/2013 0430   BUN 15 07/02/2013 0430   CREATININE 0.91 07/02/2013 0430   CALCIUM 8.3* 07/02/2013 0430   PROT 5.3* 07/02/2013 0430   ALBUMIN 1.9* 07/02/2013 0430   AST 69* 07/02/2013 0430   ALT 64* 07/02/2013 0430   ALKPHOS 621* 07/02/2013 0430   BILITOT 2.8* 07/02/2013 0430   GFRNONAA 72* 07/02/2013 0430   GFRAA 83* 07/02/2013 0430   Lipase     Component Value Date/Time   LIPASE 18 06/28/2013 2114       Studies/Results: No results found.  Anti-infectives: Anti-infectives   Start     Dose/Rate Route Frequency Ordered Stop   06/29/13 0045  Ampicillin-Sulbactam (UNASYN) 3 g in sodium chloride 0.9 % 100 mL IVPB     3 g 100 mL/hr over 60 Minutes Intravenous Every 8 hours  06/29/13 0044     06/28/13 2245  ciprofloxacin (CIPRO) IVPB 400 mg  Status:  Discontinued     400 mg 200 mL/hr over 60 Minutes Intravenous  Once 06/28/13 2240 06/28/13 2243   06/28/13 2245  metroNIDAZOLE (FLAGYL) IVPB 500 mg  Status:  Discontinued     500 mg 100 mL/hr over 60 Minutes Intravenous  Once 06/28/13 2240 06/28/13 2243   06/28/13 2245  Ampicillin-Sulbactam (UNASYN) 3 g in sodium chloride 0.9 % 100 mL IVPB     3 g 100 mL/hr over 60 Minutes Intravenous  Once 06/28/13 2243 06/29/13 0009       Assessment/Plan Cholelithiasis and choledocholithiasis with possible cholangitis: on anticoagulation, INR now 0.96, for ERCP today, pending outcome of the ERCP, the patient could go home tomorrow and just follow up as an outpatient to discuss +/- cholecystectomy at a later date.  Follow labs tomorrow.   LOS: 6 days    WHITE, ELIZABETH 07/04/2013

## 2013-07-04 NOTE — Interval H&P Note (Signed)
History and Physical Interval Note:  07/04/2013 12:41 PM  [name]  has presented today for surgery, with the diagnosis of stones  The various methods of treatment have been discussed with the patient and family. After consideration of risks, benefits and other options for treatment, the patient has consented to  Procedure(s): ENDOSCOPIC RETROGRADE CHOLANGIOPANCREATOGRAPHY (ERCP) (N/A) as a surgical intervention .  The patient's history has been reviewed, patient examined, no change in status, stable for surgery.  I have reviewed the patient's chart and labs.  Questions were answered to the patient's satisfaction.     Yancey Flemings

## 2013-07-04 NOTE — Progress Notes (Signed)
CSW following for return to [facility] when medically ready. CSW has completed FL2 & will continue to follow and assist with return. Anticipating discharge tomorrow, depending on outcome of ERCP this afternoon.    Unice Bailey, LCSW [facility] Clinical Social Worker cell #: [phone]

## 2013-07-04 NOTE — Progress Notes (Signed)
Patient refused PO Lasix today. MD made aware and has written orders to d/c medication.

## 2013-07-04 NOTE — Progress Notes (Addendum)
[facility] PROGRESS NOTE  [name] WUJ:[id number] DOB: 77-Feb-1920 DOA: 06/28/2013 PCP: [name], MD  Brief narrative 79 year old male, with history of A. fib on Coumadin, malignant melanoma, right femoral neck fracture, embolic CVA, OSA, HTN, HL, mechanical fall on 05/29/13, was sent to the [facility] ED [facility] on 06/28/13 with complaints of an episode of vomiting and fever of 103.22F. Patient also gave history of one-2 weeks of decreased appetite but no abdominal pain. In the ED, WBC 24.5, alkaline phosphatase 866 & abdominal ultrasound which showed marked intra-and extrahepatic biliary dietitian without obstructing stone or lesion. He was admitted for further evaluation and management. He was diagnosed to have biliary obstruction secondary to gallstones and possible cholangitis. Awaiting ERCP today after coagulopathy reversed. Cardiology seen for an episode of NSVT-managing conservatively.  Assessment/Plan: 1. Biliary obstruction secondary to choledocholithiasis/cholangitis : Followup CT abdomen 06/29/13 shows intra-and extrahepatic bililary dilatation.        -Continue empiric IV Unasyn tonight.       -ERCP today       -INR normal now  2. Coagulopathy: Secondary to Coumadin-held since admission. Management per problem #1.       -resume coumadin post ERCP unless further procedures required  3. Leukocytosis: Possibly secondary to problem #1. Continue empiric IV Unasyn for now. Blood cultures negative to date. Resolved.   4. Anemia: Hemoglobin stable.   5. History of A. Fib/embolic CVA on chronic Coumadin: INR: 1.0. Currently in sinus rhythm, not on any rate controlled medications at home. Monitor on telemetry. Echo shows EF 55% & basal, inferior and posterior severe hypokinesis. [facility] cardi[facility]  input appreciated-no further workup due to lack of symptoms and overall frailty. Coumadin on hold for procedure should be resumed after.  6. NSVT:  Patient had a run of 7 beat NSVT on 7/25 at 12:10 PM. Echo results as above. Cardiology consultation appreciated. They recommend conservative treatment. They do not recommend beta blockers or calcium channel blockers due to sinus bradycardia on the monitor. No further events on monitor . 7. HTN: Controlled.  8. OSA: Not on CPAP.  Ambulate, PT/OT  Code Status: DO NOT RESUSCITATE Family Communication: Discussed with son [name] on 7/27.  Disposition Plan: Return to ALF when stable.   Consultants:  [facility]  Procedures:  None  Antibiotics:  IV Unasyn 7/25 >   HPI/Subjective: Denies complaints.awaiting ERCP  Objective: Filed Vitals:   07/04/13 1515 07/04/13 1530 07/04/13 1545 07/04/13 1604  BP: 159/77 143/70 132/74 148/78  Pulse: 57 58 58 64  Temp:  97.5 F (36.4 C)  97.4 F (36.3 C)  TempSrc:      Resp: 17 21 14 14   Height:      Weight:      SpO2: 100% 100% 99% 98%    Intake/Output Summary (Last 24 hours) at 07/04/13 1619 Last data filed at 07/04/13 1536  Gross per 24 hour  Intake   1600 ml  Output   1100 ml  Net    500 ml   Filed Weights   06/29/13 0311  Weight: 67.6 kg (149 lb 0.5 oz)    Exam:   General exam: Comfortable. Elderly flail pleasant male. Hard of hearing. Hearing aid in right ear.  Respiratory system: Clear. No increased work of breathing.  Cardiovascular system: S1 & S2 heard, RRR. No JVD, murmurs, gallops, clicks or pedal edema. Telemetry: Sinus bradycardia in the 50s. No further arrhythmia since 7/25.  Gastrointestinal system: Abdomen is nondistended, soft and nontender. Normal bowel sounds heard.  Central nervous system: Alert and oriented. No focal neurological deficits.  Extremities: Symmetric 5 x 5 power.   Data Reviewed: Basic Metabolic Panel:  Recent Labs Lab 06/28/13 2114 06/29/13 0435 06/29/13 1820 06/30/13 0500 07/02/13 0430  NA 136 134*  --  134* 133*  K 3.6 4.1  --  4.2 3.6   CL 100 98  --  102 99  CO2 25 27  --  24 26  GLUCOSE 111* 119*  --  76 80  BUN 15 16  --  16 15  CREATININE 0.97 1.04  --  0.86 0.91  CALCIUM 8.2* 8.0*  --  8.1* 8.3*  MG  --   --  1.8  --   --    Liver Function Tests:  Recent Labs Lab 06/28/13 2114 06/29/13 0435 06/30/13 0500 07/02/13 0430  AST 60* 66* 48* 69*  ALT 52 55* 44 64*  ALKPHOS 866* 741* 647* 621*  BILITOT 1.1 1.1 0.8 2.8*  PROT 5.8* 5.4* 5.1* 5.3*  ALBUMIN 2.4* 2.2* 1.9* 1.9*    Recent Labs Lab 06/28/13 2114  LIPASE 18   No results found for this basename: AMMONIA,  in the last 168 hours CBC:  Recent Labs Lab 06/28/13 2114 06/29/13 0435 06/30/13 0500 07/02/13 0430  WBC 24.5* 24.5* 9.1 8.6  NEUTROABS 22.8*  --   --   --   HGB 11.2* 10.7* 10.8* 11.3*  HCT 34.3* 33.4* 33.3* 35.2*  MCV 89.1 89.8 90.2 88.9  PLT 345 388 351 338   Cardiac Enzymes: No results found for this basename: CKTOTAL, CKMB, CKMBINDEX, TROPONINI,  in the last 168 hours BNP (last 3 results) No results found for this basename: PROBNP,  in the last 8760 hours CBG: No results found for this basename: GLUCAP,  in the last 168 hours  Recent Results (from the past 240 hour(s))  MRSA PCR SCREENING     Status: None   Collection Time    06/29/13  3:09 AM      Result Value Range Status   MRSA by PCR NEGATIVE  NEGATIVE Final   Comment:            The GeneXpert MRSA Assay (FDA     approved for NASAL specimens     only), is one component of a     comprehensive MRSA colonization     surveillance program. It is not     intended to diagnose MRSA     infection nor to guide or     monitor treatment for     MRSA infections.  CULTURE, BLOOD (ROUTINE X 2)     Status: None   Collection Time    06/30/13 10:25 PM      Result Value Range Status   Specimen Description BLOOD LEFT HAND   Final   Special Requests BOTTLES DRAWN AEROBIC AND ANAEROBIC 10CC   Final   Culture  Setup Time 07/01/2013 01:45   Final   Culture     Final   Value:         BLOOD CULTURE RECEIVED NO GROWTH TO DATE CULTURE WILL BE HELD FOR 5 DAYS BEFORE ISSUING A FINAL NEGATIVE REPORT   Report Status PENDING   Incomplete  CULTURE, BLOOD (ROUTINE X 2)     Status: None   Collection Time    06/30/13 10:29 PM      Result Value Range Status   Specimen  Description BLOOD RIGHT HAND   Final   Special Requests BOTTLES DRAWN AEROBIC ONLY 10CC   Final   Culture  Setup Time 07/01/2013 01:45   Final   Culture     Final   Value:        BLOOD CULTURE RECEIVED NO GROWTH TO DATE CULTURE WILL BE HELD FOR 5 DAYS BEFORE ISSUING A FINAL NEGATIVE REPORT   Report Status PENDING   Incomplete     Studies: X-ray Chest Pa Or Ap  07/04/2013   *RADIOLOGY REPORT*  Clinical Data: Evaluate for aspiration following endoscopy  CHEST - 1 VIEW  Comparison: 06/28/2013  Findings: Stable cardiomegaly and tortuous thoracic aorta contour. Chronic mild prominence of interstitial lung markings.  There is a new faint opacity at the left lateral lung base. There is blunting of the right costophrenic angle. Possible small peripheral opacity in the right mid lung.  Remote postoperative or postsurgical changes of a posterior right rib.  IMPRESSION: 1.  Small opacities in the left lung base and possibly in the peripheral mid right lung, for which aspiration cannot be excluded. 2.  New blunting of the right costophrenic angle suspicious for small right pleural effusion. 3.  Cardiomegaly without failure.   Original Report Authenticated By: Britta Mccreedy, M.D.   Dg Ercp Biliary & Pancreatic Ducts  07/04/2013   *RADIOLOGY REPORT*  Clinical Data: Duct stones  ERCP  Comparison: None.  Findings: Multiple images demonstrate cannulation of the common bile duct and contrast filling the biliary tree which is markedly dilated.  Several common bile duct stones are noted on the images. A balloon has been utilized to sweep the common bile duct. Sphincteroplasty with an additional balloon is demonstrated.  The final image  demonstrates that the common bile duct is free of stones and widely patent at the ampulla.  IMPRESSION: Successful ampulla sphincteroplasty and stone retrieval.   Original Report Authenticated By: Jolaine Click, M.D.     Additional labs:   Scheduled Meds: . ampicillin-sulbactam (UNASYN) IV  3 g Intravenous Q8H  . famotidine  20 mg Oral QHS  . furosemide  20 mg Oral Daily  . indomethacin  100 mg Rectal Once  . pantoprazole  40 mg Oral Daily  . sodium chloride  3 mL Intravenous Q12H   Continuous Infusions: . sodium chloride 20 mL/hr at 07/03/13 0700    Principal Problem:   Choledocholithiasis with obstruction Active Problems:   HYPERLIPIDEMIA   BLINDNESS, ONE EYE   SINUS BRADYCARDIA   Personal history of malignant melanoma of skin   CVA (cerebral vascular accident)   Common bile duct dilation   Protein-calorie malnutrition, severe   Abnormal echocardiogram   Ventricular tachycardia, non-sustained   Atrial fibrillation    Time spent: 25 minutes.    [facility]  [facility] Pager [phone].   If 8PM-8AM, please contact night-coverage at [email or web address], password [facility] 07/04/2013, 4:19 PM  LOS: 6 days

## 2013-07-04 NOTE — Anesthesia Preprocedure Evaluation (Addendum)
Anesthesia Evaluation  Patient identified by MRN, date of birth, ID band Patient awake    Reviewed: Allergy & Precautions, H&P , NPO status , Patient's Chart, lab work & pertinent test results  Airway Mallampati: III TM Distance: >3 FB Neck ROM: full    Dental no notable dental hx. (+) Teeth Intact and Dental Advisory Given   Pulmonary sleep apnea and Continuous Positive Airway Pressure Ventilation , COPD Mild COPD breath sounds clear to auscultation  Pulmonary exam normal       Cardiovascular Exercise Tolerance: Good hypertension, Pt. on medications + dysrhythmias Atrial Fibrillation and Ventricular Tachycardia Rhythm:regular Rate:Normal  ECG - NSR. Prolonged QT. On coumadin   Neuro/Psych Weakness and gait disturbance.  Blind one eye CVA, Residual Symptoms negative psych ROS   GI/Hepatic negative GI ROS, Neg liver ROS, GERD-  Medicated and Controlled,  Endo/Other  negative endocrine ROS  Renal/GU negative Renal ROS  negative genitourinary   Musculoskeletal   Abdominal   Peds  Hematology negative hematology ROS (+)   Anesthesia Other Findings   Reproductive/Obstetrics negative OB ROS                          Anesthesia Physical Anesthesia Plan  ASA: III  Anesthesia Plan: General   Post-op Pain Management:    Induction: Intravenous  Airway Management Planned: Oral ETT  Additional Equipment:   Intra-op Plan:   Post-operative Plan: Extubation in OR  Informed Consent: I have reviewed the patients History and Physical, chart, labs and discussed the procedure including the risks, benefits and alternatives for the proposed anesthesia with the patient or authorized representative who has indicated his/her understanding and acceptance.   Dental Advisory Given  Plan Discussed with: CRNA and Surgeon  Anesthesia Plan Comments:         Anesthesia Quick Evaluation

## 2013-07-04 NOTE — Anesthesia Postprocedure Evaluation (Signed)
  Anesthesia Post-op Note  Patient: [name]  Procedure(s) Performed: Procedure(s) (LRB): ENDOSCOPIC RETROGRADE CHOLANGIOPANCREATOGRAPHY (ERCP) (N/A)  Patient Location: PACU  Anesthesia Type: General  Level of Consciousness: awake and alert   Airway and Oxygen Therapy: Patient Spontanous Breathing  Post-op Pain: mild  Post-op Assessment: Post-op Vital signs reviewed, Patient's Cardiovascular Status Stable, Respiratory Function Stable, Patent Airway and No signs of Nausea or vomiting  Last Vitals:  Filed Vitals:   07/04/13 1530  BP: 143/70  Pulse: 58  Temp: 36.4 C  Resp: 21    Post-op Vital Signs: stable   Complications: No apparent anesthesia complications. Denies SOB. Oxygen saturations have been good in PACU. Doubt aspiration.

## 2013-07-04 NOTE — Progress Notes (Signed)
Patient interviewed and examined. Agree with the assessment and treatment plan outlined I was with white, PA.  The patient remains asymptomatic and his abdominal exam is unremarkable. He does not have acute cholecystitis.He will have ERCP today To evaluate and hopefully resolve his choledocholithiasis. If he does well from the ERCP I would advise discharge from the hospital and followup in our office with the family to discuss whether or not he has an elective cholecystectomy.  Angelia Mould. Derrell Lolling, M.D., [facility] General and Minimally invasive Surgery Breast and Colorectal Surgery Office:   [phone] Pager:   [phone]

## 2013-07-04 NOTE — Progress Notes (Signed)
Pt refuses to wear cpap again tonight.  Jacqulynn Cadet RRT

## 2013-07-04 NOTE — Transfer of Care (Signed)
Immediate Anesthesia Transfer of Care Note  Patient: [name]  Procedure(s) Performed: Procedure(s): ENDOSCOPIC RETROGRADE CHOLANGIOPANCREATOGRAPHY (ERCP) (N/A)  Patient Location: PACU  Anesthesia Type:General  Level of Consciousness: awake, alert  and oriented  Airway & Oxygen Therapy: Patient Spontanous Breathing and Patient connected to face mask oxygen  Post-op Assessment: Report given to PACU RN and Post -op Vital signs reviewed and stable  Post vital signs: Reviewed and stable  Complications: No apparent anesthesia complications

## 2013-07-04 NOTE — Op Note (Signed)
[facility] [address] [place], [address]   ERCP PROCEDURE REPORT  PATIENT: [name], [name].  MR# :[id number] BIRTHDATE: 13-Dec-1918  GENDER: Male ENDOSCOPIST: Roxy Cedar, MD REFERRED BY: Claud Kelp, M.D. PROCEDURE DATE:  07/04/2013 PROCEDURE:   ERCP with sphincterotomy/papillotomy and ERCP with removal of calculus/calculi ASA CLASS:   Class III INDICATIONS:established bile duct stone(s). MEDICATIONS: See Anesthesia Report and General endotracheal anesthesia (GETA) TOPICAL ANESTHETIC: none DESCRIPTION OF PROCEDURE:   After the risks benefits and alternatives of the procedure were thoroughly explained, informed consent was obtained.  The pentax [id number]  endoscope was introduced through the mouth  and advanced to the second portion of the duodenum . ENDOSCOPIC EXAM: The side-viewing endoscope was placed blindly into the esophagus.  The distal esophagus revealed a ringlike peptic stricture.  The stomach revealed a hiatal hernia but was otherwise normal.  The duodenal bulb and post bulbar duodenum were normal. The major ampulla was normal.  The minor ampulla was not sought X-ray FINDINGS: A stat radiograph of the abdomen with the endoscope in position was obtained and revealed no significant abnormalities. Initial injection of contrast via the major ampulla yielded a normal partial pancreatogram.  Common bile duct was subsequently deeply cannulated with free guidewire technique.  Thereafter, complete filling of the biliary tree revealed marked biliary dilation and 3 large stones measuring approximately 15 mm each. THERAPY: With the hydrophilic guidewire in the proximal biliary tree, a biliary sphincterotomy was performed using the ERBE system with cutting in the 12:00 orientation.  The size of the sphincterotomy was somewhat limited by the configuration of the distal bile duct and ampulla.  Subsequently, a 10 mm biliary balloon was used to dilate the  distal bile duct and ampulla. Thereafter, the large stones were extracted with a combination of wire basket and balloon technique.  Post extraction occlusion cholangiogram demonstrated complete clearance of the bile duct with excellent biliary drainage. The scope was then completely withdrawn from the patient and the procedure terminated.   COMPLICATIONS: .  There were no complications. ENDOSCOPIC IMPRESSION: 1. Choledocholithiasis status post ERCP with sphincterotomy and successful stone extraction  RECOMMENDATIONS: 1. Return to hospital for post procedure observation. Administered indomethacin rectal suppository 100 mg once 2. Continue antibiotics. Advance diet as tolerated. Monitor laboratoriesl  _______________________________ eSigned:  Roxy Cedar, MD 07/04/2013 2:38 PM  XB:[id number] Denice Paradise, MD Claudette Head, MD Claud Kelp, MD

## 2013-07-04 NOTE — Progress Notes (Signed)
ANTICOAGULATION CONSULT NOTE - Initial Consult  Pharmacy Consult for Coumadin   Indication: atrial fibrillation  Allergies  Allergen Reactions  . Sulfonamide Derivatives Rash    REACTION: rash    Patient Measurements: Height: 5\' 2"  (157.5 cm) Weight: 149 lb 0.5 oz (67.6 kg) IBW/kg (Calculated) : 54.6   Vital Signs: Temp: 97.4 F (36.3 C) (07/30 1604) Temp src: Oral (07/30 1155) BP: 148/78 mmHg (07/30 1604) Pulse Rate: 64 (07/30 1604)  Labs:  Recent Labs  07/02/13 0430 07/02/13 1710 07/03/13 0427 07/04/13 0443  HGB 11.3*  --   --   --   HCT 35.2*  --   --   --   PLT 338  --   --   --   LABPROT 24.8* 23.4* 23.6* 12.6  INR 2.33* 2.16* 2.18* 0.96  CREATININE 0.91  --   --   --     Estimated Creatinine Clearance: 44.7 ml/min (by C-G formula based on Cr of 0.91).   Assessment: 89 yoM on chronic Coumadin for atrial fibrillation (CHADS = 4) and Hx embolic CVA. Home Coumadin dose of 3mg  daily except 4.5mg  on Tues and Sat. Last dose 7/23, patient had been bridged with prophylactic dose enoxaparin. Coumadin had been held this admission secondary to need for ERCP which was performed today.  INR therapeutic at 2.58 on admission  Warfarin has been held since 7/24  Patient received Vit K PO 2.5mg  on 7/27, 7.5mg  PO total 7/28, 5mg  IV total 7/29, will expect some residual effect from this while trying to get INR back in therapeutic range  INR this AM SUB-therapeutic at 0.96  Patient not currently on any interacting medication  With decreased PO intake surrounding surgery therefore will not be too aggressive when resuming Coumadin therapy  Discussed patient with Dr. Jomarie Longs, will continue enoxaparin prophylactic dose bridge while in-patient    Goal of Therapy:  INR 2-3 Monitor platelets by anticoagulation protocol: Yes   Plan:  - enoxaparin 40mg  [place] daily until INR > 2 or patient is discharged - Coumadin 5mg  PO x 1 tonight - patient should continue Coumadin 5mg  PO  daily until INR therapeutic, will likely need INR check Saturday 8/2 or Monday 8/4 if discharged tomorrow (7/31) - daily INR - F/u CBC, s/sx bleeding   Thank you for the consult.  Tomi Bamberger, PharmD Clinical Pharmacist Pager: [phone] 07/04/2013 5:48 PM

## 2013-07-04 NOTE — Progress Notes (Signed)
Assisted patient along with a coworker plus cane from bed to stretcher for transport to endoscopy.  Patient was very weak and needed majority of our help to ambulate to the stretcher. Recommend PT. MD aware. New orders placed for PT.

## 2013-07-04 NOTE — H&P (View-Only) (Signed)
[facility] Progress Note   Subjective  no complaints today. No appetite    Objective   Vital signs in last 24 hours: Temp:  [98 F (36.7 C)-98.6 F (37 C)] 98 F (36.7 C) (07/29 1035) Pulse Rate:  [55-60] 55 (07/29 1035) Resp:  [18-20] 18 (07/29 1035) BP: (146-171)/(73-86) 157/73 mmHg (07/29 1035) SpO2:  [97 %-98 %] 98 % (07/29 1012) Last BM Date: 07/02/13 General:   pleasant white male in NAD Heart:  Regular rate and rhythm Abdomen:  Soft, nontender and nondistended. Normal bowel sounds. Extremities:  Without edema. Psych:  Cooperative. Normal mood and affect.  Lab Results:  Recent Labs  07/02/13 0430  WBC 8.6  HGB 11.3*  HCT 35.2*  PLT 338   BMET  Recent Labs  07/02/13 0430  NA 133*  K 3.6  CL 99  CO2 26  GLUCOSE 80  BUN 15  CREATININE 0.91  CALCIUM 8.3*   LFT  Recent Labs  07/02/13 0430  PROT 5.3*  ALBUMIN 1.9*  AST 69*  ALT 64*  ALKPHOS 621*  BILITOT 2.8*   PT/INR  Recent Labs  07/02/13 1710 07/03/13 0427  LABPROT 23.4* 23.6*  INR 2.16* 2.18*      Assessment / Plan:   79year old male with cholelithiasis/ choledocholithiasis / possible cholangitis with leukocytosis on admission. WBC now normal on antibiotics. Still trying to get INR down so ERCP can be done. INR down to 2.16 after more Vitamin K yesterday. He is getting FFP right now which should transiently reverse coagulopathy. He will need more Vitamin K, will give 5mg IV now. Check INR at 5am. ERCP scheduled for tomorrow.  Surgery planning for cholecystectomy. Next Lovenox injection due at 6pm tonight. Will d/c Lovenox in preparation for ERCP, we will resume following procedure. Patient getting scheduled antibiotics so need for pre-procedure antibiotics.     LOS: 5 days   Paula Guenther  07/03/2013, 11:15 AM   GI ATTENDING  Patient seen and examined. Interval history and laboratories as well as exam reviewed. Agree with above. Appreciate cardiology clearance. Still  with coagulopathy. Would give IV vitamin K. Tentatively set up for ERCP tomorrow 1 PM with general anesthesia.The nature of the procedure, as well as the risks, benefits, and alternatives were carefully and thoroughly reviewed with the patient. Ample time for discussion and questions allowed. The patient understood, was satisfied, and agreed to proceed.  John N. Perry, Jr., M.D. [facility] 

## 2013-07-04 NOTE — Progress Notes (Signed)
PROGRESS NOTE  Subjective:   Mr. Finken has a hx of HTN, atrial fib, and CVA  and was admitted with fever and vomiting.  He has been found to have choledocholithiasis.  His echo shows an overall normal LV function with inferior basal severe hypokinesis.    He does not recall having any previous cardiac problems.    Objective:    Vital Signs:   Temp:  [97.7 F (36.5 C)-98.5 F (36.9 C)] 98.3 F (36.8 C) (07/30 0449) Pulse Rate:  [54-59] 54 (07/30 0449) Resp:  [16-20] 20 (07/30 0449) BP: (145-176)/(73-88) 156/88 mmHg (07/30 0449) SpO2:  [96 %-98 %] 96 % (07/30 0449)  Last BM Date: 07/02/13   24-hour weight change: Weight change:   Weight trends: Filed Weights   06/29/13 0311  Weight: 149 lb 0.5 oz (67.6 kg)    Intake/Output:  07/29 0701 - 07/30 0700 In: 1225 [P.O.:240; I.V.:290; Blood:645; IV Piggyback:50] Out: 1550 [Urine:1550]     Physical Exam: BP 156/88  Pulse 54  Temp(Src) 98.3 F (36.8 C) (Oral)  Resp 20  Ht 5\' 2"  (1.575 m)  Wt 149 lb 0.5 oz (67.6 kg)  BMI 27.25 kg/m2  SpO2 96%  General: Vital signs reviewed and noted.   Head: Normocephalic, atraumatic.  Eyes: conjunctivae/corneas clear.  EOM's intact.   Throat: normal  Neck:  normal   Lungs:  clear  Heart:  RR, normal S1, S2  Abdomen:  Soft, non-tender, non-distended    Extremities: No edema   Neurologic: A&O X3,   Hard of hearing. Right arm is flexed   Psych: Normal     Labs: BMET:  Recent Labs  07/02/13 0430  NA 133*  K 3.6  CL 99  CO2 26  GLUCOSE 80  BUN 15  CREATININE 0.91  CALCIUM 8.3*    Liver function tests:  Recent Labs  07/02/13 0430  AST 69*  ALT 64*  ALKPHOS 621*  BILITOT 2.8*  PROT 5.3*  ALBUMIN 1.9*   No results found for this basename: LIPASE, AMYLASE,  in the last 72 hours  CBC:  Recent Labs  07/02/13 0430  WBC 8.6  HGB 11.3*  HCT 35.2*  MCV 88.9  PLT 338    Cardiac Enzymes: No results found for this basename: CKTOTAL, CKMB, TROPONINI,  in the  last 72 hours  Coagulation Studies:  Recent Labs  07/02/13 0430 07/02/13 1710 07/03/13 0427 07/04/13 0443  LABPROT 24.8* 23.4* 23.6* 12.6  INR 2.33* 2.16* 2.18* 0.96      Other results:  Tele:  Sinus brady at 58  ( small P waves)   Medications:    Infusions: . sodium chloride 20 mL/hr at 07/03/13 0700    Scheduled Medications: . ampicillin-sulbactam (UNASYN) IV  3 g Intravenous Q8H  . famotidine  20 mg Oral QHS  . furosemide  20 mg Oral Daily  . pantoprazole  40 mg Oral Daily  . sodium chloride  3 mL Intravenous Q12H    Assessment/ Plan:   Principal Problem:   Choledocholithiasis with obstruction Active Problems:   HYPERLIPIDEMIA   BLINDNESS, ONE EYE   SINUS BRADYCARDIA   Personal history of malignant melanoma of skin   CVA (cerebral vascular accident)   Common bile duct dilation   Protein-calorie malnutrition, severe   Abnormal echocardiogram   Ventricular tachycardia, non-sustained   Atrial fibrillation  1. Pre-op eval. Patient is doing well.  He should be at no additional risk for ERCP from a cardiac standpoint ( over  and beyond the risk associated with a typical  79 yo)  2. Paroxysmal atrial fib.:  Currently in NSR.  Agree with plans for anticoagulation following ERCP and any other procedures.    His INR has normalized after FFP and vit K.    No further recs at this time.   Disposition:  Length of Stay: 6  Vesta Mixer, Montez Hageman., MD, [facility] 07/04/2013, 7:19 AM Office [phone] Pager [phone]

## 2013-07-05 ENCOUNTER — Encounter (HOSPITAL_COMMUNITY): Payer: Self-pay | Admitting: Internal Medicine

## 2013-07-05 DIAGNOSIS — D649 Anemia, unspecified: Secondary | ICD-10-CM

## 2013-07-05 DIAGNOSIS — K8051 Calculus of bile duct without cholangitis or cholecystitis with obstruction: Secondary | ICD-10-CM

## 2013-07-05 DIAGNOSIS — E507 Other ocular manifestations of vitamin A deficiency: Secondary | ICD-10-CM

## 2013-07-05 DIAGNOSIS — I4891 Unspecified atrial fibrillation: Secondary | ICD-10-CM

## 2013-07-05 DIAGNOSIS — E43 Unspecified severe protein-calorie malnutrition: Secondary | ICD-10-CM

## 2013-07-05 DIAGNOSIS — I10 Essential (primary) hypertension: Secondary | ICD-10-CM

## 2013-07-05 DIAGNOSIS — I6350 Cerebral infarction due to unspecified occlusion or stenosis of unspecified cerebral artery: Secondary | ICD-10-CM

## 2013-07-05 DIAGNOSIS — I472 Ventricular tachycardia: Secondary | ICD-10-CM

## 2013-07-05 HISTORY — DX: Anemia, unspecified: D64.9

## 2013-07-05 HISTORY — DX: Other ocular manifestations of vitamin A deficiency: E50.7

## 2013-07-05 LAB — CBC
Hemoglobin: 11.4 g/dL — ABNORMAL LOW (ref 13.0–17.0)
MCH: 28.6 pg (ref 26.0–34.0)
Platelets: 348 10*3/uL (ref 150–400)
RBC: 3.98 MIL/uL — ABNORMAL LOW (ref 4.22–5.81)
WBC: 7.8 10*3/uL (ref 4.0–10.5)

## 2013-07-05 LAB — COMPREHENSIVE METABOLIC PANEL
AST: 43 U/L — ABNORMAL HIGH (ref 0–37)
BUN: 14 mg/dL (ref 6–23)
CO2: 26 mEq/L (ref 19–32)
Calcium: 8.3 mg/dL — ABNORMAL LOW (ref 8.4–10.5)
Chloride: 96 mEq/L (ref 96–112)
Creatinine, Ser: 0.91 mg/dL (ref 0.50–1.35)
GFR calc non Af Amer: 72 mL/min — ABNORMAL LOW (ref >90)
Total Bilirubin: 1.6 mg/dL — ABNORMAL HIGH (ref 0.3–1.2)

## 2013-07-05 LAB — PROTIME-INR
INR: 0.97 (ref 0.00–1.49)
Prothrombin Time: 12.7 seconds (ref 11.6–15.2)

## 2013-07-05 MED ORDER — WARFARIN SODIUM 1 MG PO TABS: 5.0000 mg | ORAL_TABLET | ORAL | Status: DC

## 2013-07-05 NOTE — Progress Notes (Signed)
Patient is set to discharge to [facility] today. Patient has agreed to go to the SNF "for a few days" before returning to ALF. Patient & son aware. Discharge packet in [place]. PTAR scheduled for 2:00 pickup (Service Request Id: [id number]).   Unice Bailey, LCSW [facility] Clinical Social Worker cell #: [phone]

## 2013-07-05 NOTE — Discharge Summary (Signed)
Physician Discharge Summary  [name] ZOX:[id number] DOB: December 30, 1918 DOA: 06/28/2013  PCP: [name], MD  Admit date: 06/28/2013 Discharge date: 07/05/2013  Time spent: 50 minutes  Recommendations for Outpatient Follow-up:  1. INR check 8/3 and titrate warfarin dose for Goal INR 2-3 2. Dr.Ingram, [facility] in 2-3 weeks to discuss cholecystectomy  Discharge Diagnoses:  Principal Problem:   Choledocholithiasis with obstruction Active Problems:   HYPERLIPIDEMIA   BLINDNESS, ONE EYE   SINUS BRADYCARDIA   Personal history of malignant melanoma of skin   CVA (cerebral vascular accident)   Common bile duct dilation   Protein-calorie malnutrition, severe   Abnormal echocardiogram   Ventricular tachycardia, non-sustained   P.Atrial fibrillation   Discharge Condition: stable, improved  Diet recommendation: heart healthy, low sodium  Filed Weights   06/29/13 0311  Weight: 67.6 kg (149 lb 0.5 oz)    History of present illness:  [name] is a 79 y.o. male known history of A. fib, CVA, malignant melanoma, history of right femoral neck fracture, 531, 12, embolic CVA, OSA, hypertension, hyperlipidemia, recent mechanical fall 05/29/2013 who presented from [facility] this evening after noting fever 103.9, one episode of nonbilious, nonbloody vomiting.    Hospital Course:  79 year old male, with history of A. fib on Coumadin, malignant melanoma, right femoral neck fracture, embolic CVA, OSA, HTN, HL, mechanical fall on 05/29/13, was sent to the [facility] ED from [facility] on 06/28/13 with complaints of an episode of vomiting and fever of 103.90F. Patient also gave history of one-2 weeks of decreased appetite but no abdominal pain. In the ED, WBC 24.5, alkaline phosphatase 866 & abdominal ultrasound which showed marked intra-and extrahepatic biliary dietitian without obstructing stone or lesion. He was admitted for further evaluation and  management.  Detailed course:  1. Biliary obstruction secondary to choledocholithiasis/cholangitis :  -Followup CT abdomen 06/29/13 shows intra-and extrahepatic bililary dilatation.        - was briefly on empiric IV Unasyn.        - after anticoagulation was reversed with Vitamin K, he underwent ERCP with sphincterotomy and successful stone extraction.       -CLinically asymptomatic and improved since, LFTS improved, patient declines cholecystectomy at this time and will Fu with Dr.Ingram in few weeks to discuss this.  Coagulopathy: Secondary to Coumadin, reversed with holding dose and Vitamin K for ERCP.      -resumed coumadin post ERCP, INR 0.9 at discharge, is being discharged on a higher dose of coumadin for the next few days and repeat INR check 8/3  4. Anemia:chronic ,stable  6. History of A. Fib/embolic CVA on chronic Coumadin: INR: 1.0. Currently in sinus rhythm, not on any rate controlled medications at home. Monitor on telemetry. Echo shows EF 55% & basal, inferior and posterior severe hypokinesis. [place] cardiology saw the patient in consultation and -no further workup recommended due to lack of symptoms and overall frailty. Coumadin resumed post procedure as noted above  7.   NSVT: Patient had a run of 7 beat NSVT on 7/25 at 12:10 PM. Echo results as above. Cardiology consultation appreciated. They recommend conservative treatment. They do not recommend beta blockers or calcium channel blockers due to sinus bradycardia on the monitor. No further events on monitor .  7. HTN: Controlled.  8. OSA: Not on CPAP.    Deconditioning, needs PT for ongoing physical rehabilitation  Code Status: DO NOT RESUSCITATE  Procedures: ERCP Dr.Perry 7/30 ENDOSCOPIC IMPRESSION:  1. Choledocholithiasis  status post ERCP with sphincterotomy and successful stone extraction   Consultations:  GI Dr.PErry  CCS Dr.Ingram  CArdiology dr.Nahser  Discharge Exam: Filed Vitals:   07/04/13 1545  07/04/13 1604 07/04/13 2105 07/05/13 0528  BP: 132/74 148/78 153/62 159/67  Pulse: 58 64 61 63  Temp:  97.4 F (36.3 C) 97.5 F (36.4 C) 98.4 F (36.9 C)  TempSrc:   Oral Oral  Resp: 14 14 16    Height:      Weight:      SpO2: 99% 98% 98% 96%    General: AAOx3 Cardiovascular: S1S2/RRR Respiratory: CTAB  Discharge Instructions  Discharge Orders   Future Appointments Provider Department Dept Phone   07/24/2013 2:00 PM Ernestene Mention, MD [facility], [place] [phone]   07/24/2013 4:00 PM Nyoka Cowden, MD [facility] [phone]   Future Orders Complete By Expires     Diet - low sodium heart healthy  As directed     Increase activity slowly  As directed         Medication List         acetaminophen 325 MG tablet  Commonly known as:  TYLENOL  Take 650 mg by mouth every 6 (six) hours as needed for pain.     furosemide 20 MG tablet  Commonly known as:  LASIX  20 mg daily.     hydroxypropyl methylcellulose 2.5 % ophthalmic solution  Commonly known as:  ISOPTO TEARS  Place 1 drop into both eyes 4 (four) times daily as needed (eye irritation).     meclizine 25 MG tablet  Commonly known as:  ANTIVERT  Take 12.5-25 mg by mouth 3 (three) times daily as needed for dizziness.     omeprazole 20 MG capsule  Commonly known as:  PRILOSEC  Take 20 mg by mouth daily.     ranitidine 150 MG tablet  Commonly known as:  ZANTAC  Take 150 mg by mouth at bedtime.     warfarin 1 MG tablet  Commonly known as:  COUMADIN  - Take 5 tablets (5 mg total) by mouth as directed. Take 5mg  daily till 8/2, then based on INR on 8/3  - GOAL INR 2-3       Allergies  Allergen Reactions  . Sulfonamide Derivatives Rash    REACTION: rash       Follow-up Information   Follow up with Ernestene Mention, MD On 07/24/2013. (Arrive at 1:45pm for your appt at 2pm)    Contact information:   [address] [place] [address] [phone]        The  results of significant diagnostics from this hospitalization (including imaging, microbiology, ancillary and laboratory) are listed below for reference.    Significant Diagnostic Studies: X-ray Chest Pa Or Ap  07/04/2013   *RADIOLOGY REPORT*  Clinical Data: Evaluate for aspiration following endoscopy  CHEST - 1 VIEW  Comparison: 06/28/2013  Findings: Stable cardiomegaly and tortuous thoracic aorta contour. Chronic mild prominence of interstitial lung markings.  There is a new faint opacity at the left lateral lung base. There is blunting of the right costophrenic angle. Possible small peripheral opacity in the right mid lung.  Remote postoperative or postsurgical changes of a posterior right rib.  IMPRESSION: 1.  Small opacities in the left lung base and possibly in the peripheral mid right lung, for which aspiration cannot be excluded. 2.  New blunting of the right costophrenic angle suspicious for small right pleural effusion. 3.  Cardiomegaly without failure.   Original Report Authenticated By: Britta Mccreedy, M.D.   Dg Chest 1 View  06/28/2013   *RADIOLOGY REPORT*  Clinical Data: Fever, COPD, hypertension  CHEST - 1 VIEW  Comparison: 05/29/2013; 10/15/2012l 05/13/2010; PET CT - 06/26/2008  Findings: Grossly unchanged enlarged cardiac silhouette and mediastinal contours with apparent accentuated ectasia of the thoracic aorta due to patient rotation to the right.  The lungs appear hyperexpanded with flattening of the bilateral hemidiaphragms.  No new focal airspace opacities.  No pleural effusion or pneumothorax.  There is persistent deformity involving the lateral aspect of the right chest wall.  IMPRESSION: Hyperexpanded lungs without acute cardiopulmonary disease.   Original Report Authenticated By: Tacey Ruiz, MD   US Abdomen Complete  06/28/2013   *RADIOLOGY REPORT*  Clinical Data:  Fever. Elevated white count.  Elevated LFTs.  COMPLETE ABDOMINAL ULTRASOUND  Comparison:  None available.  Findings:   Gallbladder:  1.5 cm mobile shadowing stone is present within the gallbladder.  Wall thickness is at the upper limits of normal at 3.6 mm.  There is no sonographic Murphy's sign.  Common bile duct:  The common bile duct is markedly dilated, measuring 17 mm.  No obstructing lesion or stone is identified.  Liver:  Moderate intrahepatic biliary dilation is present.  No focal mass lesion is present.  IVC:  Appears normal.  Pancreas:  Visualization of the pancreas is limited to the pancreatic head due to overlying bowel gas.  No obstructing lesion is identified.  Spleen:  Normal size and echotexture without focal parenchymal abnormality.  The maximal length is 7.2 cm.  Right Kidney:  The renal cortex is thinned.  The kidney is hyperechoic.  No focal mass lesion or stone is present.  The maximal length is 10.8 cm, within normal limits.  Left Kidney:  The renal cortex is thinned.  The kidney is hyperechoic.  No focal mass lesion or stone is present.  There is no hydronephrosis.  The maximal length is 10.7 cm, within normal limits.  Abdominal aorta:  The distal aorta is obscured by gas.  The more proximal aorta is within normal limits.  IMPRESSION:  1.  Marked intra and extrahepatic biliary dilation.  No obstructing lesion or stone is identified.  The common bile duct is dilated into the pancreatic head. 2.  Bilateral hyperechoic kidneys with thinning of the parenchyma. This is compatible with nonspecific medical renal disease. 3.  Single large gallstone without evidence for acute cholecystitis.   Original Report Authenticated By: Marin Roberts, M.D.   Ct Abdomen Pelvis W Contrast  06/29/2013   *RADIOLOGY REPORT*  Clinical Data: Fever.  Bile duct dilatation on previous ultrasound.  CT ABDOMEN AND PELVIS WITH CONTRAST  Technique:  Multidetector CT imaging of the abdomen and pelvis was performed following the standard protocol during bolus administration of intravenous contrast.  Contrast: OMNIPAQUE IOHEXOL 300  MG/ML  SOLN  Comparison: Ultrasound abdomen 07/24/ 2014  Findings: Atelectasis in the lung bases.  Coronary artery calcifications.  There is diffuse intra and extrahepatic bile duct dilatation extending down to the pancreatic head region.  No visualized obstructing mass or stone is identified.  The pancreatic duct is not dilated and the pancreas is atrophic.  There is a stone in the gallbladder.  No gallbladder wall thickening or inflammatory change.  No focal liver lesions.  Spleen size is normal.  There is a right adrenal gland nodule measuring 1.7 cm diameter.  This is likely to represent an adenoma.  The kidneys are somewhat atrophic without focal mass lesion or hydronephrosis.  Calcification of the abdominal aorta without aneurysm.  Tortuous aorta.  Normal appearance of inferior vena cava.  No retroperitoneal lymphadenopathy.  The stomach and small bowel are not abnormally distended.  Stool filled colon without distension or wall thickening.  No free air or free fluid in the abdomen.  Pelvis:  Visualization of the low pelvis is limited due to streak artifact arising from right hip prosthesis.  The prostate gland appears enlarged at 5.2 x 4.5 cm diameter.  No apparent bladder wall thickening.  The rectosigmoid colon is decompressed without evidence of diverticulitis.  No free or loculated pelvic fluid collections.  Appendix is normal.  No significant pelvic lymphadenopathy.  Surgical clips in the left groin.  Degenerative changes in the lumbar spine.  No destructive bone lesions appreciated.  Degenerative disc disease at L3-4 and L4-5 levels.  IMPRESSION: Intra and extrahepatic bile duct dilatation is present.  No cause of obstruction is identified. An occult obstructing stone or mass should be excluded.  ERCP or MRCP recommended to assess for cause of obstruction.  Cholelithiasis without inflammatory change.  No evidence of bowel obstruction or inflammatory change.  Prostate gland enlargement.   Original Report  Authenticated By: Burman Nieves, M.D.   Mr Mrcp  06/29/2013   *RADIOLOGY REPORT*  Clinical Data:  biliary ductal dilatation.  MRI ABDOMEN WITHOUT   CONTRAST (MRCP)  Technique:  Multiplanar multisequence MR imaging of the abdomen was performed without and with contrast, including heavily T2-weighted images of the biliary and pancreatic ducts.  Three-dimensional MR images were rendered by post processing of the original MR data.  Contrast:  15 ml Multihance  Comparison:  CT 06/29/2013  Findings:  There is extensive intra and extra hepatic biliary ductal dilatation involving  the left and right hepatic lobes.  The common hepatic duct is dilated to 13 mm.  There is a large filling defect at the confluence of the common hepatic duct and the cystic duct measuring 13 mm (image 26, series four).   More distally in the common bile duct there are two filling defects which have faceted margins measuring 9  and 7 mm on images 29 and 30 respectively of the same series.  There is a 14 mm filling defect within the fundus of the gallbladder.  There are multiple small cysts scattered throughout the pancreas. No discrete pancreatic ductal dilatation is present.  Most of these small cystic change are less than 10 mm.  There is a 19 mm cyst in the head of pancreas (image 34, series 4).  The spleen, adrenal glands, and kidneys are normal.  The stomach limited view of the small bowel and colon unremarkable.  No pelvic lymphadenopathy.  There is bilateral small effusions.  IMPRESSION:  1.  Two obstructing stones within the common bile duct and a large stone at the confluence of the cystic duct and the common hepatic duct.  There is significant biliary obstruction t proximal to the stones.  2.  Single gallstone within the gallbladder.  Cystic duct is also dilated. 3.  Multiple cysts throughout the pancreas suggest chronic pancreatitis.  Findings conveyed to Jadene Pierini, PA on 06/29/2013 at 1515 hours.   Original Report Authenticated  By: Genevive Bi, M.D.   Mr 3d Recon At Scanner  06/29/2013   *RADIOLOGY REPORT*  Clinical Data:  biliary ductal dilatation.  MRI ABDOMEN WITHOUT   CONTRAST (MRCP)  Technique:  Multiplanar multisequence MR imaging of the  abdomen was performed without and with contrast, including heavily T2-weighted images of the biliary and pancreatic ducts.  Three-dimensional MR images were rendered by post processing of the original MR data.  Contrast:  15 ml Multihance  Comparison:  CT 06/29/2013  Findings:  There is extensive intra and extra hepatic biliary ductal dilatation involving  the left and right hepatic lobes.  The common hepatic duct is dilated to 13 mm.  There is a large filling defect at the confluence of the common hepatic duct and the cystic duct measuring 13 mm (image 26, series four).   More distally in the common bile duct there are two filling defects which have faceted margins measuring 9  and 7 mm on images 29 and 30 respectively of the same series.  There is a 14 mm filling defect within the fundus of the gallbladder.  There are multiple small cysts scattered throughout the pancreas. No discrete pancreatic ductal dilatation is present.  Most of these small cystic change are less than 10 mm.  There is a 19 mm cyst in the head of pancreas (image 34, series 4).  The spleen, adrenal glands, and kidneys are normal.  The stomach limited view of the small bowel and colon unremarkable.  No pelvic lymphadenopathy.  There is bilateral small effusions.  IMPRESSION:  1.  Two obstructing stones within the common bile duct and a large stone at the confluence of the cystic duct and the common hepatic duct.  There is significant biliary obstruction t proximal to the stones.  2.  Single gallstone within the gallbladder.  Cystic duct is also dilated. 3.  Multiple cysts throughout the pancreas suggest chronic pancreatitis.  Findings conveyed to Jadene Pierini, PA on 06/29/2013 at 1515 hours.   Original Report Authenticated  By: Genevive Bi, M.D.   Dg Ercp Biliary & Pancreatic Ducts  07/04/2013   *RADIOLOGY REPORT*  Clinical Data: Duct stones  ERCP  Comparison: None.  Findings: Multiple images demonstrate cannulation of the common bile duct and contrast filling the biliary tree which is markedly dilated.  Several common bile duct stones are noted on the images. A balloon has been utilized to sweep the common bile duct. Sphincteroplasty with an additional balloon is demonstrated.  The final image demonstrates that the common bile duct is free of stones and widely patent at the ampulla.  IMPRESSION: Successful ampulla sphincteroplasty and stone retrieval.   Original Report Authenticated By: Jolaine Click, M.D.    Microbiology: Recent Results (from the past 240 hour(s))  MRSA PCR SCREENING     Status: None   Collection Time    06/29/13  3:09 AM      Result Value Range Status   MRSA by PCR NEGATIVE  NEGATIVE Final   Comment:            The GeneXpert MRSA Assay (FDA     approved for NASAL specimens     only), is one component of a     comprehensive MRSA colonization     surveillance program. It is not     intended to diagnose MRSA     infection nor to guide or     monitor treatment for     MRSA infections.  CULTURE, BLOOD (ROUTINE X 2)     Status: None   Collection Time    06/30/13 10:25 PM      Result Value Range Status   Specimen Description BLOOD LEFT HAND   Final   Special Requests BOTTLES DRAWN  AEROBIC AND ANAEROBIC 10CC   Final   Culture  Setup Time 07/01/2013 01:45   Final   Culture     Final   Value:        BLOOD CULTURE RECEIVED NO GROWTH TO DATE CULTURE WILL BE HELD FOR 5 DAYS BEFORE ISSUING A FINAL NEGATIVE REPORT   Report Status PENDING   Incomplete  CULTURE, BLOOD (ROUTINE X 2)     Status: None   Collection Time    06/30/13 10:29 PM      Result Value Range Status   Specimen Description BLOOD RIGHT HAND   Final   Special Requests BOTTLES DRAWN AEROBIC ONLY 10CC   Final   Culture  Setup  Time 07/01/2013 01:45   Final   Culture     Final   Value:        BLOOD CULTURE RECEIVED NO GROWTH TO DATE CULTURE WILL BE HELD FOR 5 DAYS BEFORE ISSUING A FINAL NEGATIVE REPORT   Report Status PENDING   Incomplete     Labs: Basic Metabolic Panel:  Recent Labs Lab 06/28/13 2114 06/29/13 0435 06/29/13 1820 06/30/13 0500 07/02/13 0430 07/05/13 0417  NA 136 134*  --  134* 133* 136  K 3.6 4.1  --  4.2 3.6 3.7  CL 100 98  --  102 99 96  CO2 25 27  --  24 26 26   GLUCOSE 111* 119*  --  76 80 64*  BUN 15 16  --  16 15 14   CREATININE 0.97 1.04  --  0.86 0.91 0.91  CALCIUM 8.2* 8.0*  --  8.1* 8.3* 8.3*  MG  --   --  1.8  --   --   --    Liver Function Tests:  Recent Labs Lab 06/28/13 2114 06/29/13 0435 06/30/13 0500 07/02/13 0430 07/05/13 0417  AST 60* 66* 48* 69* 43*  ALT 52 55* 44 64* 43  ALKPHOS 866* 741* 647* 621* 570*  BILITOT 1.1 1.1 0.8 2.8* 1.6*  PROT 5.8* 5.4* 5.1* 5.3* 5.5*  ALBUMIN 2.4* 2.2* 1.9* 1.9* 2.2*    Recent Labs Lab 06/28/13 2114  LIPASE 18   No results found for this basename: AMMONIA,  in the last 168 hours CBC:  Recent Labs Lab 06/28/13 2114 06/29/13 0435 06/30/13 0500 07/02/13 0430 07/05/13 0417  WBC 24.5* 24.5* 9.1 8.6 7.8  NEUTROABS 22.8*  --   --   --   --   HGB 11.2* 10.7* 10.8* 11.3* 11.4*  HCT 34.3* 33.4* 33.3* 35.2* 35.6*  MCV 89.1 89.8 90.2 88.9 89.4  PLT 345 388 351 338 348   Cardiac Enzymes: No results found for this basename: CKTOTAL, CKMB, CKMBINDEX, TROPONINI,  in the last 168 hours BNP: BNP (last 3 results) No results found for this basename: PROBNP,  in the last 8760 hours CBG: No results found for this basename: GLUCAP,  in the last 168 hours     Signed:  Jahmya Onofrio  [facility] 07/05/2013, 10:40 AM

## 2013-07-05 NOTE — Progress Notes (Signed)
Called [facility] and gave report to [facility], RN, who will be accepting patient.

## 2013-07-05 NOTE — Progress Notes (Signed)
Patient ID: [name], male   DOB: 08-03-1919, 79 y.o.   MRN: [id number] [facility] Progress Note  Subjective: Feels like a million bucks.... No c/o abdominal pain or nausea, would like to go home. Tolerated full liquids diet without difficulty. He is not inclined to have his gallbladder removed  Objective:  Vital signs in last 24 hours: Temp:  [97.4 F (36.3 C)-98.4 F (36.9 C)] 98.4 F (36.9 C) (07/31 0528) Pulse Rate:  [57-64] 63 (07/31 0528) Resp:  [13-21] 16 (07/30 2105) BP: (132-172)/(62-86) 159/67 mmHg (07/31 0528) SpO2:  [95 %-100 %] 96 % (07/31 0528) Last BM Date: 07/04/13 General:   Alert,  Well-developed,  Elderly WM  in NAD Heart:  Regular rate and rhythm; no murmurs Pulm;clear Abdomen:  Soft, nontender and nondistended. Normal bowel sounds, without guarding, and without rebound.   Extremities:  Without edema. Neurologic:  Alert and  oriented x4;  grossly normal neurologically. Psych:  Alert and cooperative. Normal mood and affect.  Intake/Output from previous day: 07/30 0701 - 07/31 0700 In: 2334.2 [P.O.:360; I.V.:1774.2; IV Piggyback:200] Out: 775 [Urine:775] Intake/Output this shift:    Lab Results:  Recent Labs  07/05/13 0417  WBC 7.8  HGB 11.4*  HCT 35.6*  PLT 348   BMET  Recent Labs  07/05/13 0417  NA 136  K 3.7  CL 96  CO2 26  GLUCOSE 64*  BUN 14  CREATININE 0.91  CALCIUM 8.3*   LFT  Recent Labs  07/05/13 0417  PROT 5.5*  ALBUMIN 2.2*  AST 43*  ALT 43  ALKPHOS 570*  BILITOT 1.6*   PT/INR  Recent Labs  07/04/13 0443 07/05/13 0417  LABPROT 12.6 12.7  INR 0.96 0.97      Assessment / Plan: #1   79 yo male stable s/p ERCP and stone extraction x 3 on  7/30. Feels well, LFt's improving Advance diet as tolerates Resume anticoagulation Has been on Unasyn x 6 days-cultures negative - OK to stop Ok for discharge from GI standpoint #2 cholelithiasis- surgery recommended  outpt discussion with family. Pt does  not want to have surgery Principal Problem:   Choledocholithiasis with obstruction Active Problems:   HYPERLIPIDEMIA   BLINDNESS, ONE EYE   SINUS BRADYCARDIA   Personal history of malignant melanoma of skin   CVA (cerebral vascular accident)   Common bile duct dilation   Protein-calorie malnutrition, severe   Abnormal echocardiogram   Ventricular tachycardia, non-sustained   Atrial fibrillation     LOS: 7 days   [name]  07/05/2013, 8:41 AM  GI ATTENDING  Patient personally seen and examined. Interval data reviewed. Agree with history, physical, and assessment/plan as outlined above. Doing well post ERCP with sphincterotomy and stone extraction. Outpatient followup with surgery as planned. GI followup as needed.  [name]. [name]., M.D. [facility] Division of Gastroenterology

## 2013-07-05 NOTE — Progress Notes (Signed)
Stopped to see patient today. He says he is ready to go back to [facility]. Trying to decide if he should go to rehab for a few days to gain strength and then back to assisted living. Very pleasant gentleman. Expressed support and encouragement.

## 2013-07-05 NOTE — Progress Notes (Signed)
I have personally interviewed and examined this patient today. I agree with the assessment and treatment plan outlined by Doristine Mango., PA. He plans to followup with me in my office in about one month along with his family members. We will discuss the pros and cons of elective cholecystectomy at that point in time.  Angelia Mould. Derrell Lolling, M.D., [facility] General and Minimally invasive Surgery Breast and Colorectal Surgery Office:   [phone] Pager:   [phone]

## 2013-07-05 NOTE — Progress Notes (Signed)
PROGRESS NOTE  Subjective:   [name] has a hx of HTN, atrial fib, and CVA  and was admitted with fever and vomiting.  He has been found to have choledocholithiasis.  His echo shows an overall normal LV function with inferior basal severe hypokinesis.    He does not recall having any previous cardiac problems.   He has successful ERCP yesterday   Objective:    Vital Signs:   Temp:  [97.4 F (36.3 C)-98.4 F (36.9 C)] 98.4 F (36.9 C) (07/31 0528) Pulse Rate:  [57-64] 63 (07/31 0528) Resp:  [13-21] 16 (07/30 2105) BP: (132-172)/(62-86) 159/67 mmHg (07/31 0528) SpO2:  [95 %-100 %] 96 % (07/31 0528)  Last BM Date: 07/04/13   24-hour weight change: Weight change:   Weight trends: Filed Weights   06/29/13 0311  Weight: 149 lb 0.5 oz (67.6 kg)    Intake/Output:  07/30 0701 - 07/31 0700 In: 2334.2 [P.O.:360; I.V.:1774.2; IV Piggyback:200] Out: 775 [Urine:775]     Physical Exam: BP 159/67  Pulse 63  Temp(Src) 98.4 F (36.9 C) (Oral)  Resp 16  Ht 5\' 2"  (1.575 m)  Wt 149 lb 0.5 oz (67.6 kg)  BMI 27.25 kg/m2  SpO2 96%  General: Vital signs reviewed and noted.   Head: Normocephalic, atraumatic.  Eyes: conjunctivae/corneas clear.  EOM's intact.   Throat: normal  Neck: normal   Lungs:  clear  Heart:  RR, normal S1, S2  Abdomen:  Soft, non-tender, non-distended    Extremities: No edema   Neurologic: A&O X3,   Hard of hearing. Right arm is flexed   Psych: Normal     Labs: BMET:  Recent Labs  07/05/13 0417  NA 136  K 3.7  CL 96  CO2 26  GLUCOSE 64*  BUN 14  CREATININE 0.91  CALCIUM 8.3*    Liver function tests:  Recent Labs  07/05/13 0417  AST 43*  ALT 43  ALKPHOS 570*  BILITOT 1.6*  PROT 5.5*  ALBUMIN 2.2*   No results found for this basename: LIPASE, AMYLASE,  in the last 72 hours  CBC:  Recent Labs  07/05/13 0417  WBC 7.8  HGB 11.4*  HCT 35.6*  MCV 89.4  PLT 348    Cardiac Enzymes: No results found for this basename:  CKTOTAL, CKMB, TROPONINI,  in the last 72 hours  Coagulation Studies:  Recent Labs  07/02/13 1710 07/03/13 0427 07/04/13 0443 07/05/13 0417  LABPROT 23.4* 23.6* 12.6 12.7  INR 2.16* 2.18* 0.96 0.97      Other results:  Tele:  Sinus brady at 61   Medications:    Infusions: . sodium chloride 10 mL/hr at 07/05/13 0537    Scheduled Medications: . ampicillin-sulbactam (UNASYN) IV  3 g Intravenous Q8H  . enoxaparin (LOVENOX) injection  40 mg Subcutaneous Q24H  . famotidine  20 mg Oral QHS  . pantoprazole  40 mg Oral Daily  . sodium chloride  3 mL Intravenous Q12H  . Warfarin - Pharmacist Dosing Inpatient   Does not apply q1800    Assessment/ Plan:   Principal Problem:   Choledocholithiasis with obstruction Active Problems:   HYPERLIPIDEMIA   BLINDNESS, ONE EYE   SINUS BRADYCARDIA   Personal history of malignant melanoma of skin   CVA (cerebral vascular accident)   Common bile duct dilation   Protein-calorie malnutrition, severe   Abnormal echocardiogram   Ventricular tachycardia, non-sustained   Atrial fibrillation  1. Pre-op eval. Patient is doing well following ERCP.  2. Paroxysmal atrial fib.:  Currently in NSR.  Agree with starting coumadin with goal INR of 2.0-3.0.    No further recs at this time.  Will sign off.  Call for questions.    Disposition:  Length of Stay: 7  Vesta Mixer, Montez Hageman., MD, [facility] 07/05/2013, 7:41 AM Office [phone] Pager [phone]

## 2013-07-05 NOTE — Progress Notes (Signed)
Patient ID: [name], male   DOB: 09/10/1919, 79 y.o.   MRN: [id number] 1 Day Post-Op  Subjective: Pt says he feels fine, Denies pain/n/v.  Ok with discharge today  Objective: Vital signs in last 24 hours: Temp:  [97.4 F (36.3 C)-98.4 F (36.9 C)] 98.4 F (36.9 C) (07/31 0528) Pulse Rate:  [57-64] 63 (07/31 0528) Resp:  [13-21] 16 (07/30 2105) BP: (132-172)/(62-86) 159/67 mmHg (07/31 0528) SpO2:  [95 %-100 %] 96 % (07/31 0528) Last BM Date: 07/04/13  Intake/Output from previous day: 07/30 0701 - 07/31 0700 In: 2334.2 [P.O.:360; I.V.:1774.2; IV Piggyback:200] Out: 775 [Urine:775] Intake/Output this shift:    PE: Abd: soft, nontender, +BS General: HOH, awake, alert, NAD  Lab Results:   Recent Labs  07/05/13 0417  WBC 7.8  HGB 11.4*  HCT 35.6*  PLT 348   BMET  Recent Labs  07/05/13 0417  NA 136  K 3.7  CL 96  CO2 26  GLUCOSE 64*  BUN 14  CREATININE 0.91  CALCIUM 8.3*   PT/INR  Recent Labs  07/04/13 0443 07/05/13 0417  LABPROT 12.6 12.7  INR 0.96 0.97   CMP     Component Value Date/Time   NA 136 07/05/2013 0417   K 3.7 07/05/2013 0417   CL 96 07/05/2013 0417   CO2 26 07/05/2013 0417   GLUCOSE 64* 07/05/2013 0417   BUN 14 07/05/2013 0417   CREATININE 0.91 07/05/2013 0417   CALCIUM 8.3* 07/05/2013 0417   PROT 5.5* 07/05/2013 0417   ALBUMIN 2.2* 07/05/2013 0417   AST 43* 07/05/2013 0417   ALT 43 07/05/2013 0417   ALKPHOS 570* 07/05/2013 0417   BILITOT 1.6* 07/05/2013 0417   GFRNONAA 72* 07/05/2013 0417   GFRAA 83* 07/05/2013 0417   Lipase     Component Value Date/Time   LIPASE 18 06/28/2013 2114       Studies/Results: X-ray Chest Pa Or Ap  07/04/2013   *RADIOLOGY REPORT*  Clinical Data: Evaluate for aspiration following endoscopy  CHEST - 1 VIEW  Comparison: 06/28/2013  Findings: Stable cardiomegaly and tortuous thoracic aorta contour. Chronic mild prominence of interstitial lung markings.  There is a new faint opacity at the left lateral lung  base. There is blunting of the right costophrenic angle. Possible small peripheral opacity in the right mid lung.  Remote postoperative or postsurgical changes of a posterior right rib.  IMPRESSION: 1.  Small opacities in the left lung base and possibly in the peripheral mid right lung, for which aspiration cannot be excluded. 2.  New blunting of the right costophrenic angle suspicious for small right pleural effusion. 3.  Cardiomegaly without failure.   Original Report Authenticated By: Britta Mccreedy, M.D.   Dg Ercp Biliary & Pancreatic Ducts  07/04/2013   *RADIOLOGY REPORT*  Clinical Data: Duct stones  ERCP  Comparison: None.  Findings: Multiple images demonstrate cannulation of the common bile duct and contrast filling the biliary tree which is markedly dilated.  Several common bile duct stones are noted on the images. A balloon has been utilized to sweep the common bile duct. Sphincteroplasty with an additional balloon is demonstrated.  The final image demonstrates that the common bile duct is free of stones and widely patent at the ampulla.  IMPRESSION: Successful ampulla sphincteroplasty and stone retrieval.   Original Report Authenticated By: Jolaine Click, M.D.    Anti-infectives: Anti-infectives   Start     Dose/Rate Route Frequency Ordered Stop   06/29/13 0045  Ampicillin-Sulbactam (UNASYN)  3 g in sodium chloride 0.9 % 100 mL IVPB     3 g 100 mL/hr over 60 Minutes Intravenous Every 8 hours 06/29/13 0044     06/28/13 2245  ciprofloxacin (CIPRO) IVPB 400 mg  Status:  Discontinued     400 mg 200 mL/hr over 60 Minutes Intravenous  Once 06/28/13 2240 06/28/13 2243   06/28/13 2245  metroNIDAZOLE (FLAGYL) IVPB 500 mg  Status:  Discontinued     500 mg 100 mL/hr over 60 Minutes Intravenous  Once 06/28/13 2240 06/28/13 2243   06/28/13 2245  Ampicillin-Sulbactam (UNASYN) 3 g in sodium chloride 0.9 % 100 mL IVPB     3 g 100 mL/hr over 60 Minutes Intravenous  Once 06/28/13 2243 06/29/13 0009        Assessment/Plan Cholelithiasis and choledocholithiasis with possible cholangitis: ok to discharge home from surgical standpoint, will plan outpatient follow up in 2-3 weeks with Dr. Derrell Lolling to discuss cholecystectomy.   LOS: 7 days    Ormand Senn 07/05/2013

## 2013-07-05 NOTE — Evaluation (Signed)
Physical Therapy Evaluation Patient Details Name: [name] MRN: [id number] DOB: [birth year] Today's Date: 07/05/2013 Time: 0865-7846 PT Time Calculation (min): 18 min  PT Assessment / Plan / Recommendation History of Present Illness  hx of HTN, atrial fib, and CVA  and was admitted with fever and vomiting.  He has been found to have choledocholithiasis  Clinical Impression  Pt admitted with above and s/p ERCP. Pt currently with functional limitations due to the deficits listed below (see PT Problem List).  Pt will benefit from skilled PT to increase their independence and safety with mobility to allow discharge to the venue listed below. Pt reports use of w/c and electric scooter for mobility at ALF.  Will need at least mod assist upon return to ALF and pt reports staff occasional need to assist him with transfers.       PT Assessment  Patient needs continued PT services    Follow Up Recommendations  Home health PT;Supervision/Assistance - 24 hour    Does the patient have the potential to tolerate intense rehabilitation      Barriers to Discharge        Equipment Recommendations  None recommended by PT    Recommendations for Other Services     Frequency Min 3X/week    Precautions / Restrictions Precautions Precautions: Fall   Pertinent Vitals/Pain n/a      Mobility  Bed Mobility Bed Mobility: Supine to Sit;Sitting - Scoot to Edge of Bed Supine to Sit: 3: Mod assist;With rails;HOB elevated Sitting - Scoot to Edge of Bed: 3: Mod assist;With rail Details for Bed Mobility Assistance: assist for trunk upright and then use of bed pad to scoot EOB Transfers Transfers: Sit to Stand;Stand to [employer] Transfers Sit to Stand: 4: Min assist;With upper extremity assist Stand to Sit: 4: Min assist;With upper extremity assist Stand Pivot Transfers: 4: Min assist;With armrests Details for Transfer Assistance: assist to stand and steady, pt used cane in R hand and verbal  cues for L hand to reach for armrest, slow short steps over to recliner Ambulation/Gait Ambulation/Gait Assistance: Not tested (comment)    Exercises     PT Diagnosis: Generalized weakness  PT Problem List: Decreased strength;Decreased mobility PT Treatment Interventions: DME instruction;Gait training;Functional mobility training;Therapeutic activities;Therapeutic exercise;Patient/family education;Wheelchair mobility training     PT Goals(Current goals can be found in the care plan section) Acute Rehab PT Goals PT Goal Formulation: With patient Time For Goal Achievement: 07/12/13 Potential to Achieve Goals: Good  Visit Information  Last PT Received On: 07/05/13 Assistance Needed: +1 History of Present Illness: hx of HTN, atrial fib, and CVA  and was admitted with fever and vomiting.  He has been found to have choledocholithiasis       Prior Functioning  Home Living Family/patient expects to be discharged to:: Assisted living Home Equipment: Electric scooter;Wheelchair - manual;Cane - single point Prior Function Level of Independence: Needs assistance Gait / Transfers Assistance Needed: reports he occasionally needs assistance with transfers to chair, does not usually ambulate unless has increased assist, uses cane for transfers Communication Communication: HOH    Cognition  Cognition Arousal/Alertness: Awake/alert Behavior During Therapy: WFL for tasks assessed/performed Overall Cognitive Status: Within Functional Limits for tasks assessed    Extremity/Trunk Assessment Upper Extremity Assessment Upper Extremity Assessment: RUE deficits/detail RUE Deficits / Details: limited finger and wrist extension, poor grip, elbow flexion contracture, pt reports hx of CVA with residual deficits however states R UE has become weaker since this admission Lower  Extremity Assessment Lower Extremity Assessment: Generalized weakness;RLE deficits/detail RLE Deficits / Details: hx of CVA with  R residual deficits, foot drop, states has AFO but "doesn't do any good"   Balance    End of Session PT - End of Session Equipment Utilized During Treatment: Gait belt Activity Tolerance: Patient tolerated treatment well Patient left: in chair;with call bell/phone within reach;with chair alarm set  GP     Shavonne Ambroise,KATHrine E 07/05/2013, 9:36 AM Zenovia Jarred, PT, DPT 07/05/2013 Pager: [phone]

## 2013-07-07 ENCOUNTER — Encounter: Payer: Self-pay | Admitting: Internal Medicine

## 2013-07-07 ENCOUNTER — Non-Acute Institutional Stay (SKILLED_NURSING_FACILITY): Payer: Medicare Other | Admitting: Internal Medicine

## 2013-07-07 DIAGNOSIS — I4891 Unspecified atrial fibrillation: Secondary | ICD-10-CM

## 2013-07-07 DIAGNOSIS — I639 Cerebral infarction, unspecified: Secondary | ICD-10-CM

## 2013-07-07 DIAGNOSIS — I472 Ventricular tachycardia: Secondary | ICD-10-CM

## 2013-07-07 DIAGNOSIS — K8051 Calculus of bile duct without cholangitis or cholecystitis with obstruction: Secondary | ICD-10-CM

## 2013-07-07 DIAGNOSIS — I10 Essential (primary) hypertension: Secondary | ICD-10-CM

## 2013-07-07 DIAGNOSIS — E43 Unspecified severe protein-calorie malnutrition: Secondary | ICD-10-CM

## 2013-07-07 LAB — CULTURE, BLOOD (ROUTINE X 2): Culture: NO GROWTH

## 2013-07-07 NOTE — Progress Notes (Signed)
Patient ID: ANDER WAMSER, male   DOB: 29-Oct-1919, 79 y.o.   MRN: [id number] MRN: [id number] Name: [name]  Sex: male Age: 69 y.o. DOB: March 27, 1919  Facility/Room: [facility]/room 15 Level Of Care: SNF Provider: Kimber Relic Emergency Contacts: Extended Emergency Contact Information Primary Emergency Contact: [place],Williams (Rod) Address: [address]          [place], [place] [address] [place] Home Phone: [phone] Relation: Son  Code Status: Living will  Allergies: Sulfonamide derivatives  Chief Complaint  Patient presents with  . New Evaluation    Readmission to skilled nursing facility    HPI: Patient is 79 y.o. male who is readmitted to skilled nursing facility following hospitalization 06/28/2013 through 07/05/2013. He is a previous patient of Dr. [name]. This patient had lived in an assisted-living portion of [facility] briefly prior to this hospitalization.  Patient had choledocholithiasis with obstruction. This is relieved by ERCP with sphincterotomy by Dr. Yancey Flemings.  He is here for short-term rehabilitation in the SNF. We hope that he will be able to return to the assisted living area.  Current posthospitalization issues include significant weakness, a mild anemia, and anti-coagulation therapy for his history of atrial fibrillation and an embolic CVA.  Past Medical History  Diagnosis Date  . GERD (gastroesophageal reflux disease)   . Hypertension   . Hearing loss     wears hearing aid - right side  . Leg swelling   . Trouble swallowing   . Bruises easily     due to coumadin  . Weakness     difficulty walking  . Contact lens/glasses fitting   . Stroke 1999  . Cancer   . 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 Gait disturbance   . Dyslipidemia   . COPD (chronic obstructive pulmonary disease)   . Peripheral vascular disease   . History of melanoma   . Diverticulosis   . Abnormality of gait 05/16/2013  .  Choledocholithiasis with obstruction 06/28/13  . Embolic stroke   . PAF (paroxysmal atrial fibrillation)   . Hyperlipidemia   . Bradycardia   . Protein-calorie malnutrition, severe   . Ventricular tachycardia (paroxysmal)   . Obstructive sleep apnea of adult     uses cpap, pt does not know setting  . Personal history of fall 05/29/2013  . Fracture of femoral neck, right, closed 3114  . Long term (current) use of anticoagulants     PAF and embolic CVA  . Anemia, unspecified 07/05/2013  . Xerophthalmia 07/05/2013  . 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 Actinic keratosis   . Seborrheic keratosis     Past Surgical History  Procedure Laterality Date  . Excision of melanoma  2009    left leg  . Melanoma excision      many melanoma removed in past  . Melanoma excision  10/11/2011    Procedure: MELANOMA EXCISION;  Surgeon: Valarie Merino, MD;  Location: [facility] OR;  Service: General;  Laterality: Left;  EXCISION melanoma left leg with full thickness skin grafting from left lower abdomen.  . Joint replacement  2012    r fem head fx  . Lung benign area removed   1970's  . Hernia repair  1983  . Melanoma excision  05/16/2012    Procedure: MELANOMA EXCISION;  Surgeon: Valarie Merino, MD;  Location: [facility] ORS;  Service: General;  Laterality: Left;  Nodule Removal of Melanoma on Left Shin  . Ercp N/A  07/04/2013    Procedure: ENDOSCOPIC RETROGRADE CHOLANGIOPANCREATOGRAPHY (ERCP);  Surgeon: Hilarie Fredrickson, MD;  Location: [facility];  Service: Endoscopy;  Laterality: N/A;      Medication List       This list is accurate as of: 07/07/13  8:39 PM.  Always use your most recent med list.               acetaminophen 325 MG tablet  Commonly known as:  TYLENOL  Take 650 mg by mouth every 6 (six) hours as needed for pain.     furosemide 20 MG tablet  Commonly known as:  LASIX  20 mg daily.     hydroxypropyl methylcellulose 2.5 % ophthalmic solution  Commonly known as:  ISOPTO TEARS  Place 1 drop into  both eyes 4 (four) times daily as needed (eye irritation).     meclizine 25 MG tablet  Commonly known as:  ANTIVERT  Take 12.5-25 mg by mouth 3 (three) times daily as needed for dizziness.     omeprazole 20 MG capsule  Commonly known as:  PRILOSEC  Take 20 mg by mouth daily.     ranitidine 150 MG tablet  Commonly known as:  ZANTAC  Take 150 mg by mouth at bedtime.     warfarin 1 MG tablet  Commonly known as:  COUMADIN  - Take 5 tablets (5 mg total) by mouth as directed. Take 5mg  daily till 8/2, then based on INR on 8/3  - GOAL INR 2-3          Immunization History  Administered Date(s) Administered  . Influenza Whole 10/06/2010, 08/07/2011, 09/20/2012  . Pneumococcal Polysaccharide 05/06/1997  . Td 03/30/2005  . Tdap 05/29/2013    History  Substance Use Topics  . Smoking status: Former Smoker    Types: Pipe    Quit date: 08/31/1964  . Smokeless tobacco: Never Used  . Alcohol Use: No    Family history is noncontributory    Review of Systems  DATA OBTAINED: from patient, nurse, medical record, family member (Son at bedside) GENERAL:  no fevers, fatigue, appetite changes. He administered feeling quite weak as a result of the recent hospitalization. SKIN: No itching, rash or wounds EYES: No eye pain, redness, discharge EARS: No earache, tinnitus, change in hearing. He has a partial deafness and wears a hearing aid in the right ear. NOSE: No congestion, drainage or bleeding  MOUTH/THROAT: No mouth or tooth pain, No sore throat, No difficulty chewing or swallowing  RESPIRATORY: No cough, wheezing, SOB CARDIAC: No chest pain, palpitations, lower extremity edema  GI: No abdominal pain, No N/V/D or constipation, No heartburn or reflux  GU: No dysuria, frequency or urgency, or incontinence  MUSCULOSKELETAL: No unrelieved bone/joint pain NEUROLOGIC: Awake, alert, appropriate to situation, No change in mental status. Moves all four, no focal deficits PSYCHIATRIC: No  overt anxiety or sadness. Sleeps well. No behavior issue.  AMBULATION:  Needs assistance and the use of a walker.  Filed Vitals:   07/05/13 2023  BP: 130/70  Pulse: 72    Physical Exam  GENERAL APPEARANCE: Alert, conversant. Appropriately groomed. No acute distress. Overweight SKIN: No diaphoresis rash, or wounds. Chronic seborrheic keratoses and actinic keratoses are identified. HEAD: Normocephalic, atraumatic  EYES: Conjunctiva/lids clear. Pupils round, reactive. EOMs intact.  EARS: External exam WNL, canals clear. Hearing impaired. Wears an aid in the right ear. NOSE: No deformity or discharge.  MOUTH/THROAT: Lips w/o lesions. Mouth and throat normal. Tongue moist, w/o lesion. Missing  some teeth. NECK: No thyroid tenderness, enlargement or nodule  RESPIRATORY: Breathing is even, unlabored. Lung sounds are clear   CARDIOVASCULAR: Atrial fibrillation. No murmurs, rubs or gallops. No peripheral edema.  ARTERIAL: radial pulse 2+, DP pulse 1+  VENOUS: No varicosities. No venous stasis skin changes  GASTROINTESTINAL: Abdomen is soft, non-tender, not distended w/ normal bowel sounds. No mass, ventral or inguinal hernia. No organomegally GENITOURINARY: Bladder non tender, not distended  MUSCULOSKELETAL: No abnormal joints or musculature. Right hemiparesis. Mild contractures of the right elbow and right wrist. NEUROLOGIC: Oriented X3. Cranial nerves 2-12 grossly intact. No tremor. Right hemiparesis. PSYCHIATRIC: Mood and affect appropriate to situation, no behavioral issues  Patient Active Problem List   Diagnosis Date Noted  . Atrial fibrillation 06/30/2013  . Choledocholithiasis with obstruction 06/30/2013  . Common bile duct dilation 06/29/2013  . Protein-calorie malnutrition, severe 06/29/2013  . Abnormal echocardiogram 06/29/2013  . Ventricular tachycardia, non-sustained 06/29/2013  . Abnormality of gait 05/16/2013  . Other vitamin B12 deficiency anemia 01/31/2013  .  Cerebrovascular disease, unspecified 01/31/2013  . CVA (cerebral vascular accident) 01/19/2011  . Long term current use of anticoagulant 01/19/2011  . GYNECOMASTIA 10/28/2010  . KNEE PAIN 08/13/2010  . Edema 08/26/2008  . GERD 08/19/2008  . Personal history of malignant melanoma of skin 04/19/2008  . HYPERLIPIDEMIA 09/21/2007  . BLINDNESS, ONE EYE 09/21/2007  . HYPERTENSION 09/21/2007  . SINUS BRADYCARDIA 09/21/2007  . PVD 09/21/2007  . Diverticulosis of Colon (without Mention of Hemorrhage) 09/21/2007    Functional assessment:   CBC    Component Value Date/Time   WBC 7.8 07/05/2013 0417   RBC 3.98* 07/05/2013 0417   HGB 11.4* 07/05/2013 0417   HCT 35.6* 07/05/2013 0417   PLT 348 07/05/2013 0417   MCV 89.4 07/05/2013 0417   LYMPHSABS 0.7 06/28/2013 2114   MONOABS 1.0 06/28/2013 2114   EOSABS 0.0 06/28/2013 2114   BASOSABS 0.0 06/28/2013 2114    CMP     Component Value Date/Time   NA 136 07/05/2013 0417   K 3.7 07/05/2013 0417   CL 96 07/05/2013 0417   CO2 26 07/05/2013 0417   GLUCOSE 64* 07/05/2013 0417   BUN 14 07/05/2013 0417   CREATININE 0.91 07/05/2013 0417   CALCIUM 8.3* 07/05/2013 0417   PROT 5.5* 07/05/2013 0417   ALBUMIN 2.2* 07/05/2013 0417   AST 43* 07/05/2013 0417   ALT 43 07/05/2013 0417   ALKPHOS 570* 07/05/2013 0417   BILITOT 1.6* 07/05/2013 0417   GFRNONAA 72* 07/05/2013 0417   GFRAA 83* 07/05/2013 0417    Assessment and Plan HYPERTENSION: Currently controlled  Choledocholithiasis with obstruction: Now status post ERCP with removal of the stone. Patient is to see Dr. Claud Kelp, surgeon, in followup to discuss cholecystectomy.  Atrial fibrillation: Controlled rate  Ventricular tachycardia, non-sustained: Encounter during hospitalization. His only present briefly.  Protein-calorie malnutrition, severe: Nutritional and dietary needs will be addressed during this stay. He currently has a reasonable appetite according to his history.  CVA (cerebral vascular  accident): Resulted in right hemiparesis which is stable.  Debility: Patient will be engaged in physical therapy and occupational therapy. Weakness of the right side is chronic and may slow him down in his recovery.     [name], Lenon Curt, MD

## 2013-07-09 ENCOUNTER — Non-Acute Institutional Stay (SKILLED_NURSING_FACILITY): Payer: Medicare Other | Admitting: Nurse Practitioner

## 2013-07-09 ENCOUNTER — Encounter: Payer: Self-pay | Admitting: Nurse Practitioner

## 2013-07-09 DIAGNOSIS — I4891 Unspecified atrial fibrillation: Secondary | ICD-10-CM

## 2013-07-09 DIAGNOSIS — R609 Edema, unspecified: Secondary | ICD-10-CM

## 2013-07-09 DIAGNOSIS — H5440 Blindness, one eye, unspecified eye: Secondary | ICD-10-CM

## 2013-07-09 DIAGNOSIS — M25569 Pain in unspecified knee: Secondary | ICD-10-CM

## 2013-07-09 DIAGNOSIS — D518 Other vitamin B12 deficiency anemias: Secondary | ICD-10-CM

## 2013-07-09 DIAGNOSIS — I639 Cerebral infarction, unspecified: Secondary | ICD-10-CM

## 2013-07-09 DIAGNOSIS — I6350 Cerebral infarction due to unspecified occlusion or stenosis of unspecified cerebral artery: Secondary | ICD-10-CM

## 2013-07-09 DIAGNOSIS — G4733 Obstructive sleep apnea (adult) (pediatric): Secondary | ICD-10-CM

## 2013-07-09 DIAGNOSIS — B3749 Other urogenital candidiasis: Secondary | ICD-10-CM | POA: Insufficient documentation

## 2013-07-09 DIAGNOSIS — K8051 Calculus of bile duct without cholangitis or cholecystitis with obstruction: Secondary | ICD-10-CM

## 2013-07-09 DIAGNOSIS — R269 Unspecified abnormalities of gait and mobility: Secondary | ICD-10-CM

## 2013-07-09 DIAGNOSIS — Z66 Do not resuscitate: Secondary | ICD-10-CM

## 2013-07-09 DIAGNOSIS — K219 Gastro-esophageal reflux disease without esophagitis: Secondary | ICD-10-CM

## 2013-07-09 NOTE — Assessment & Plan Note (Signed)
Takes Omeprazole 20mg and Ranitidine 150mg    

## 2013-07-09 NOTE — Assessment & Plan Note (Signed)
W/c for mobility °

## 2013-07-09 NOTE — Assessment & Plan Note (Signed)
The CT abdomen 06/29/13 shows intra-and extrahepatic bililary dilatation. - was briefly on empiric IV Unasyn. - after anticoagulation was reversed with Vitamin K, he underwent ERCP with sphincterotomy and successful stone extraction. -CLinically asymptomatic and improved since, LFTS improved, patient declines cholecystectomy while in hospital and will Fu with Dr.Ingram in few weeks to discuss this. Will check CBC, CMP, TSH, lipid panel.

## 2013-07-09 NOTE — Assessment & Plan Note (Signed)
CPAP on at hs and off in am

## 2013-07-09 NOTE — Assessment & Plan Note (Signed)
May apply Mycolog II cream bid prn until healed.

## 2013-07-09 NOTE — Assessment & Plan Note (Signed)
Rate controlled without rhythm agent. Chronic anticoagulation therapy with Coumadin for risk reduction of thromboembolic event       

## 2013-07-09 NOTE — Assessment & Plan Note (Signed)
Left eye

## 2013-07-09 NOTE — Assessment & Plan Note (Signed)
Update CBC. 

## 2013-07-09 NOTE — Assessment & Plan Note (Signed)
Edema is well controlled with Furosemide.

## 2013-07-09 NOTE — Progress Notes (Signed)
Patient ID: [name], male   DOB: 10/28/1919, 79 y.o.   MRN: [id number] Code Status: DNR  Allergies  Allergen Reactions  . Sulfonamide Derivatives Rash    REACTION: rash    Chief Complaint  Patient presents with  . Medical Managment of Chronic Issues    buttock rash rash    HPI: Patient is a 79 y.o. male seen in the SNF at [facility] today for evaluation of buttock rash and other chronic medical conditions. He has history of A. fib on Coumadin, malignant melanoma, right femoral neck fracture, embolic CVA, OSA, HTN, HL, mechanical fall on 05/29/13, was sent to the [facility] ED [facility] on 06/28/13 with complaints of an episode of vomiting and fever of 103.3F. Patient also gave history of one-2 weeks of decreased appetite but no abdominal pain. In the ED, WBC 24.5, alkaline phosphatase 866 & abdominal ultrasound which showed marked intra-and extrahepatic biliary dietitian without obstructing stone or lesion. The CT abdomen 06/29/13 shows intra-and extrahepatic bililary dilatation. - was briefly on empiric IV Unasyn. - after anticoagulation was reversed with Vitamin K, he underwent ERCP with sphincterotomy and successful stone extraction. -CLinically asymptomatic and improved since, LFTS improved, patient declines cholecystectomy while in hospital and will Fu with Dr.Ingram in few weeks to discuss this.      Problem List Items Addressed This Visit   Abnormality of gait (Chronic)     W/c for mobility     Atrial fibrillation     Rate controlled without rhythm agent. Chronic anticoagulation therapy with Coumadin for risk reduction of thromboembolic event    BLINDNESS, ONE EYE     Left eye    Candidiasis of perineum - Primary     May apply Mycolog II cream bid prn until healed.     Choledocholithiasis with obstruction     The CT abdomen 06/29/13 shows intra-and extrahepatic bililary dilatation. - was briefly on empiric IV Unasyn. - after  anticoagulation was reversed with Vitamin K, he underwent ERCP with sphincterotomy and successful stone extraction. -CLinically asymptomatic and improved since, LFTS improved, patient declines cholecystectomy while in hospital and will Fu with Dr.Ingram in few weeks to discuss this. Will check CBC, CMP, TSH, lipid panel.     CVA (cerebral vascular accident)     Hx. Right sided weakness even if grip strength is 5/5    DNR (do not resuscitate)   Edema     Edema is well controlled with Furosemide.     GERD     Takes Omeprazole 20mg  and Ranitidine 150mg      KNEE PAIN     Aches and pains with weight bearing in his knees--prn Tylenol and wheelchair help.     Obstructive sleep apnea     CPAP on at hs and off in am     Other vitamin B12 deficiency anemia     Update CBC       Review of Systems:  Review of Systems  Constitutional: Positive for weight loss. Negative for fever, chills, malaise/fatigue and diaphoresis.  HENT: Positive for hearing loss. Negative for ear pain, nosebleeds, congestion, sore throat, neck pain, tinnitus and ear discharge.   Eyes: Negative for blurred vision, double vision, photophobia, pain, discharge and redness.       Left eye blindness  Respiratory: Negative for cough, hemoptysis, sputum production, shortness of breath, wheezing and stridor.   Cardiovascular: Positive for leg swelling (L>R). Negative for chest pain, palpitations, orthopnea, claudication and PND.  Gastrointestinal: Negative for heartburn, nausea, vomiting, abdominal pain, diarrhea, constipation, blood in stool and melena.  Genitourinary: Negative for dysuria, urgency, frequency, hematuria and flank pain.  Musculoskeletal: Negative for myalgias, back pain, joint pain and falls.  Skin: Positive for rash (buttocks diaper rash). Negative for itching.  Neurological: Positive for focal weakness (left sided weakness) and weakness (left sided and genralized). Negative for dizziness, tingling, tremors,  sensory change, speech change, seizures, loss of consciousness and headaches.  Endo/Heme/Allergies: Negative for environmental allergies and polydipsia. Bruises/bleeds easily.  Psychiatric/Behavioral: Negative for depression, suicidal ideas, hallucinations, memory loss and substance abuse. The patient is not nervous/anxious and does not have insomnia.      Past Medical History  Diagnosis Date  . GERD (gastroesophageal reflux disease)   . Hypertension   . Hearing loss     wears hearing aid - right side  . Leg swelling   . Trouble swallowing   . Bruises easily     due to coumadin  . Weakness     difficulty walking  . Contact lens/glasses fitting   . Stroke 1999  . Cancer   . 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 Gait disturbance   . Dyslipidemia   . COPD (chronic obstructive pulmonary disease)   . Peripheral vascular disease   . History of melanoma   . Diverticulosis   . Abnormality of gait 05/16/2013  . Choledocholithiasis with obstruction 06/28/13  . Embolic stroke   . PAF (paroxysmal atrial fibrillation)   . Hyperlipidemia   . Bradycardia   . Protein-calorie malnutrition, severe   . Ventricular tachycardia (paroxysmal)   . Obstructive sleep apnea of adult     uses cpap, pt does not know setting  . Personal history of fall 05/29/2013  . Fracture of femoral neck, right, closed 3114  . Long term (current) use of anticoagulants     PAF and embolic CVA  . Anemia, unspecified 07/05/2013  . Xerophthalmia 07/05/2013  . 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 Actinic keratosis   . Seborrheic keratosis    Past Surgical History  Procedure Laterality Date  . Excision of melanoma  2009    left leg  . Melanoma excision      many melanoma removed in past  . Melanoma excision  10/11/2011    Procedure: MELANOMA EXCISION;  Surgeon: Valarie Merino, MD;  Location: [facility] OR;  Service: General;  Laterality: Left;  EXCISION melanoma left leg with full thickness skin grafting from left lower abdomen.  . Joint  replacement  2012    r fem head fx  . Lung benign area removed   1970's  . Hernia repair  1983  . Melanoma excision  05/16/2012    Procedure: MELANOMA EXCISION;  Surgeon: Valarie Merino, MD;  Location: [facility]S;  Service: General;  Laterality: Left;  Nodule Removal of Melanoma on Left Shin  . Ercp N/A 07/04/2013    Procedure: ENDOSCOPIC RETROGRADE CHOLANGIOPANCREATOGRAPHY (ERCP);  Surgeon: Hilarie Fredrickson, MD;  Location: [facility];  Service: Endoscopy;  Laterality: N/A;   Social History:   reports that he quit smoking about 48 years ago. His smoking use included Pipe. He has never used smokeless tobacco. He reports that he does not drink alcohol or use illicit drugs.  Family History  Problem Relation Age of Onset  . Heart disease Father 62  . Cancer Son     prostate  . Dementia Sister     One sister has dementia    Medications:  Patient's Medications  New Prescriptions   No medications on file  Previous Medications   ACETAMINOPHEN (TYLENOL) 325 MG TABLET    Take 650 mg by mouth every 6 (six) hours as needed for pain.    FUROSEMIDE (LASIX) 20 MG TABLET    20 mg daily.    HYDROXYPROPYL METHYLCELLULOSE (ISOPTO TEARS) 2.5 % OPHTHALMIC SOLUTION    Place 1 drop into both eyes 4 (four) times daily as needed (eye irritation).    MECLIZINE (ANTIVERT) 25 MG TABLET    Take 12.5-25 mg by mouth 3 (three) times daily as needed for dizziness.    OMEPRAZOLE (PRILOSEC) 20 MG CAPSULE    Take 20 mg by mouth daily.   RANITIDINE (ZANTAC) 150 MG TABLET    Take 150 mg by mouth at bedtime.   WARFARIN (COUMADIN) 1 MG TABLET    Take 5 tablets (5 mg total) by mouth as directed. Take 5mg  daily till 8/2, then based on INR on 8/3 GOAL INR 2-3  Modified Medications   No medications on file  Discontinued Medications   No medications on file     Physical Exam: Physical Exam  Constitutional: He is oriented to person, place, and time. He appears well-developed and well-nourished. No distress.  HENT:  Head:  Normocephalic and atraumatic.  Right Ear: External ear normal.  Left Ear: External ear normal.  Nose: Nose normal.  Mouth/Throat: Oropharynx is clear and moist. No oropharyngeal exudate.  Eyes: Conjunctivae are normal. Pupils are equal, round, and reactive to light. Right eye exhibits no discharge. No scleral icterus.  Left eye blindness  Neck: Normal range of motion. Neck supple. No JVD present. No tracheal deviation present. No thyromegaly present.  Cardiovascular: Normal rate and normal heart sounds.  An irregular rhythm present.  Pulmonary/Chest: Effort normal and breath sounds normal. No stridor. No respiratory distress. He has no wheezes. He has no rales. He exhibits no tenderness.  Abdominal: He exhibits no distension. There is no tenderness. There is no rebound and no guarding.  Musculoskeletal: Normal range of motion. He exhibits edema (L>R). He exhibits no tenderness.  Lymphadenopathy:    He has no cervical adenopathy.  Neurological: He is alert and oriented to person, place, and time. He displays abnormal reflex. No cranial nerve deficit. Coordination normal.  Skin: Skin is warm and dry. Rash noted. He is not diaphoretic. No erythema. No pallor.  Redness buttocks  Psychiatric: He has a normal mood and affect. His behavior is normal. Judgment and thought content normal.    Filed Vitals:   07/09/13 1052  BP: 160/87  Pulse: 80  Temp: 97.4 F (36.3 C)  TempSrc: Tympanic  Resp: 16      Labs reviewed: Basic Metabolic Panel:  Recent Labs  40/98/11 1820 06/30/13 0500 07/02/13 0430 07/05/13 0417  NA  --  134* 133* 136  K  --  4.2 3.6 3.7  CL  --  102 99 96  CO2  --  24 26 26   GLUCOSE  --  76 80 64*  BUN  --  16 15 14   CREATININE  --  0.86 0.91 0.91  CALCIUM  --  8.1* 8.3* 8.3*  MG 1.8  --   --   --    Liver Function Tests:  Recent Labs  06/30/13 0500 07/02/13 0430 07/05/13 0417  AST 48* 69* 43*  ALT 44 64* 43  ALKPHOS 647* 621* 570*  BILITOT 0.8 2.8*  1.6*  PROT 5.1* 5.3* 5.5*  ALBUMIN 1.9* 1.9*  2.2*    Recent Labs  06/28/13 2114  LIPASE 18   No results found for this basename: AMMONIA,  in the last 8760 hours CBC:  Recent Labs  05/29/13 1616 06/28/13 2114  06/30/13 0500 07/02/13 0430 07/05/13 0417  WBC 10.6* 24.5*  < > 9.1 8.6 7.8  NEUTROABS 9.0* 22.8*  --   --   --   --   HGB 13.3 11.2*  < > 10.8* 11.3* 11.4*  HCT 40.3 34.3*  < > 33.3* 35.2* 35.6*  MCV 89.2 89.1  < > 90.2 88.9 89.4  PLT 340 345  < > 351 338 348  < > = values in this interval not displayed. Lipid Panel: No results found for this basename: CHOL, HDL, LDLCALC, TRIG, CHOLHDL, LDLDIRECT,  in the last 8760 hours Anemia Panel: No results found for this basename: FOLATE, IRON, VITAMINB12,  in the last 8760 hours  Past Procedures: 06/28/13 [place] abd IMPRESSION:  1. Marked intra and extrahepatic biliary dilation. No obstructing  lesion or stone is identified. The common bile duct is dilated  into the pancreatic head.  2. Bilateral hyperechoic kidneys with thinning of the parenchyma.  This is compatible with nonspecific medical renal disease.  3. Single large gallstone without evidence for acute  cholecystitis.   06/29/13 2 D echocardiogram: EF 55%  06/29/13 MRI abd w/o CM    Assessment/PlanIMPRESSION:  1. Two obstructing stones within the common bile duct and a large  stone at the confluence of the cystic duct and the common hepatic  duct. There is significant biliary obstruction t proximal to the  stones.  2. Single gallstone within the gallbladder. Cystic duct is also  dilated.  3. Multiple cysts throughout the pancreas suggest chronic  pancreatitis.  Atrial fibrillation Rate controlled without rhythm agent. Chronic anticoagulation therapy with Coumadin for risk reduction of thromboembolic event  Edema Edema is well controlled with Furosemide.   Abnormality of gait W/c for mobility   GERD Takes Omeprazole 20mg  and Ranitidine 150mg    KNEE  PAIN Aches and pains with weight bearing in his knees--prn Tylenol and wheelchair help.   Obstructive sleep apnea CPAP on at hs and off in am   Candidiasis of perineum May apply Mycolog II cream bid prn until healed.   Other vitamin B12 deficiency anemia Update CBC  Choledocholithiasis with obstruction The CT abdomen 06/29/13 shows intra-and extrahepatic bililary dilatation. - was briefly on empiric IV Unasyn. - after anticoagulation was reversed with Vitamin K, he underwent ERCP with sphincterotomy and successful stone extraction. -CLinically asymptomatic and improved since, LFTS improved, patient declines cholecystectomy while in hospital and will Fu with Dr.Ingram in few weeks to discuss this. Will check CBC, CMP, TSH, lipid panel.   CVA (cerebral vascular accident) Hx. Right sided weakness even if grip strength is 5/5  BLINDNESS, ONE EYE Left eye    Family/ Staff Communication: observe the patient.   Goals of Care: SNF  Labs/tests ordered: CMP, CBC, lipid panel,  and TSH next week.

## 2013-07-09 NOTE — Assessment & Plan Note (Signed)
Aches and pains with weight bearing in his knees--prn Tylenol and wheelchair help.

## 2013-07-09 NOTE — Assessment & Plan Note (Signed)
Hx. Right sided weakness even if grip strength is 5/5

## 2013-07-24 ENCOUNTER — Ambulatory Visit (INDEPENDENT_AMBULATORY_CARE_PROVIDER_SITE_OTHER): Payer: Medicare Other | Admitting: Internal Medicine

## 2013-07-24 ENCOUNTER — Encounter (INDEPENDENT_AMBULATORY_CARE_PROVIDER_SITE_OTHER): Payer: Medicare Other | Admitting: General Surgery

## 2013-07-24 ENCOUNTER — Encounter: Payer: Self-pay | Admitting: Internal Medicine

## 2013-07-24 VITALS — BP 142/84 | HR 60 | Temp 98.5°F | Ht 63.0 in | Wt 149.8 lb

## 2013-07-24 DIAGNOSIS — K219 Gastro-esophageal reflux disease without esophagitis: Secondary | ICD-10-CM

## 2013-07-24 DIAGNOSIS — I739 Peripheral vascular disease, unspecified: Secondary | ICD-10-CM

## 2013-07-24 DIAGNOSIS — I10 Essential (primary) hypertension: Secondary | ICD-10-CM

## 2013-07-24 DIAGNOSIS — I639 Cerebral infarction, unspecified: Secondary | ICD-10-CM

## 2013-07-24 DIAGNOSIS — I6350 Cerebral infarction due to unspecified occlusion or stenosis of unspecified cerebral artery: Secondary | ICD-10-CM

## 2013-07-24 NOTE — Progress Notes (Signed)
Subjective:     Patient ID: [name], male   DOB: 04-11-1919    MRN: [id number]    Brief patient profile:  89 yowm status post devastating stroke involving the right brain stem in 1999. He has been maintained on Coumadin since then and mainly complains of intermittent dizziness dating all the way back to his stroke - the dizziness  is controlled on p.r.n. meclizine.  seen 04/19/08 for fu ok except ? recurrent melanoma left leg   August 26, 2008 cleared for melanoma surgery by Cards    06/28/2012 f/u ov/Tanishia Lemaster cc R hand gradually getting weaker x 6 months, no longer getting any pt or neuro f/u but able to live in independent living so far. No other neuro complaints, speech and mentation not affected.  No ha or nausea. rec Please see patient coordinator before you leave today  to schedule PT for R hand   09/25/2012 f/u ov/Eilam Shrewsbury cc multiple concerns re chronic  R hand weak, not happy with PT eval at NH, son thinks needs new w/c with more features given his level of dysfunction.  Has not returned to neuro or rehab for so long he doesn't remember the doctors he saw re these issues..  rec Change furosemide to every other day dosing   12/25/2012 f/u ov/Enez Monahan cc  Mobility improving, f/u by [facility]>  PT, OT.  varaibly swelling L Leg, better when elevated with no sob or cough. rec Compression hose prn   07/04/13  ERCP for gallstones successful   8/19/2014post  f/u ov/Tait Balistreri / now moved up to Assisted Living and has been seen by Dr Thomasene Lot service Chief Complaint  Patient presents with  . 3 month follow up    recently in [facility] for gallstones.      Still c/w bound, no GI complains p successful stone extraction. Chronic leg selling better with teds  No difficulty with swallowing, no cough  No obvious daytime variabilty or cp or chest tightness, subjective wheeze overt sinus or hb symptoms. No unusual exp hx or h/o childhood pna/ asthma or premature birth to his knowledge.   Sleeping ok without  nocturnal  or early am exacerbation  of respiratory  c/o's  . Also denies any obvious fluctuation of symptoms with weather or environmental changes or other aggravating or alleviating factors except as outlined above   ROS  The following are not active complaints unless bolded sore throat, dysphagia, dental problems, itching, sneezing,  nasal congestion or excess/ purulent secretions, ear ache,   fever, chills, sweats, unintended wt loss, pleuritic or exertional cp, hemoptysis,  orthopnea pnd or leg swelling, presyncope, palpitations, heartburn, abdominal pain, anorexia, nausea, vomiting, diarrhea  or change in bowel or urinary habits, change in stools or urine, dysuria,hematuria,  rash, arthralgias, visual complaints, headache, numbness weakness or ataxia or problems with walking or coordination,  change in mood/affect or memory.           Past Medical History:  BLINDNESS, ONE EYE (ICD-369.60)  CVA  SINUS BRADYCARDIA (ICD-427.81)  DIVERTICULOSIS, COLON (ICD-562.10)  Internal hemorrhoids  COUGH, CHRONIC (ICD-786.2)  PVD (ICD-443.9)  HYPERLIPIDEMIA (ICD-272.4)  HYPERTENSION (ICD-401.9)  - breast tenderness on aldactone August 25, 2010 > d/c October 29, 2010  Hyperkalemia on aldactone October 14, 2008 > d/c October 28, 2010 due to gynecomastia  Melanoma, skin graft 2003..............................................Marland KitchenJorja Loa [name] (surgery) - repeat skin graft 10/09  L Leg swelling  - Venous dopplers neg 01/18/11  COPD  OSA on CPAP  GERD  Health  Maintenance ..................................................................Marland KitchenWert > Dr Chilton Si requested 07/25/2013  - Td 03/2005  - Pneumovax 1998           Objective:   Physical Exam very frail wm c/w bound   193 July 22, 2010 > 193 August 25, 2010 >  >190 12/02/10>193 January 13, 2011 > 09/20/2011 > 12/16/2011  169 > 03/23/2012  163 > 06/28/2012  174 > 09/25/2012  166 > 166 12/25/2012 > 167 04/26/2013 > 07/24/2013  150  GEN: A/Ox3;  pleasant , NAD  HEENT: [place]/AT, , EACs-clear, TMs-wnl, NOSE-clear, THROAT-clear   NECK: Supple w/ fair ROM; no JVD; normal carotid impulses w/o brui.ts; no thyromegaly or nodules palpated; no lymphadenopathy.  RESP Clear to P & A; w/o, wheezes/ rales/ or rhonchi.  CARD: RRR, no m/r/g  GI: Soft & nt; nml bowel sounds; no organomegaly or masses detected.  Musco: Warm bil, no calf tenderness, min edema, no clubbing, pulses intact Neuro alert, ? Depressed affect.  R hand contracted with minimal dorsiflexion, no  new findings        .   Assessment:

## 2013-07-24 NOTE — Patient Instructions (Addendum)
See if Dr Thomasene Lot service is willing to take over  your general medical care   Follow up here is as needed

## 2013-07-25 NOTE — Assessment & Plan Note (Addendum)
Followed in Neurology clinic/ [facility]  Remains w/c bound p devastating cva in 1999 with marginal quality of life, NCB status - at this point would be better served by geriatric specialists  Will ask Dr Chilton Si if he will assume care at [facility] and f/u here prn

## 2013-07-25 NOTE — Assessment & Plan Note (Signed)
rx lasix 20 mg daily >  Adequate control on present rx, reviewed > no change in rx needed  (note tendency to severe bradycardia with use of any med with neg chronotropy)

## 2013-07-25 NOTE — Assessment & Plan Note (Signed)
rx just zantac at hs > Adequate control on present rx, reviewed > no change in rx needed

## 2013-07-25 NOTE — Assessment & Plan Note (Signed)
On chronic coumadin per coumadin clinic  rx can be shifted to Dr Chilton Si [facility] to simplify care

## 2013-07-31 ENCOUNTER — Ambulatory Visit: Payer: Self-pay | Admitting: Pharmacist

## 2013-07-31 DIAGNOSIS — I639 Cerebral infarction, unspecified: Secondary | ICD-10-CM

## 2013-07-31 DIAGNOSIS — Z7901 Long term (current) use of anticoagulants: Secondary | ICD-10-CM

## 2013-08-09 ENCOUNTER — Emergency Department (HOSPITAL_COMMUNITY): Payer: Medicare Other

## 2013-08-09 ENCOUNTER — Encounter: Payer: Self-pay | Admitting: Nurse Practitioner

## 2013-08-09 ENCOUNTER — Non-Acute Institutional Stay: Payer: Medicare Other | Admitting: Nurse Practitioner

## 2013-08-09 ENCOUNTER — Inpatient Hospital Stay (HOSPITAL_COMMUNITY)
Admission: EM | Admit: 2013-08-09 | Discharge: 2013-08-13 | DRG: 444 | Disposition: A | Discharge status: Skilled Nursing Facility | Payer: Medicare Other | Attending: Internal Medicine | Admitting: Internal Medicine

## 2013-08-09 ENCOUNTER — Encounter (HOSPITAL_COMMUNITY): Payer: Self-pay | Admitting: Internal Medicine

## 2013-08-09 DIAGNOSIS — Z79899 Other long term (current) drug therapy: Secondary | ICD-10-CM

## 2013-08-09 DIAGNOSIS — Z66 Do not resuscitate: Secondary | ICD-10-CM | POA: Diagnosis present

## 2013-08-09 DIAGNOSIS — K810 Acute cholecystitis: Secondary | ICD-10-CM

## 2013-08-09 DIAGNOSIS — I639 Cerebral infarction, unspecified: Secondary | ICD-10-CM

## 2013-08-09 DIAGNOSIS — R6889 Other general symptoms and signs: Secondary | ICD-10-CM | POA: Diagnosis present

## 2013-08-09 DIAGNOSIS — I4891 Unspecified atrial fibrillation: Secondary | ICD-10-CM | POA: Diagnosis present

## 2013-08-09 DIAGNOSIS — K219 Gastro-esophageal reflux disease without esophagitis: Secondary | ICD-10-CM

## 2013-08-09 DIAGNOSIS — A419 Sepsis, unspecified organism: Secondary | ICD-10-CM | POA: Diagnosis present

## 2013-08-09 DIAGNOSIS — E871 Hypo-osmolality and hyponatremia: Secondary | ICD-10-CM | POA: Diagnosis present

## 2013-08-09 DIAGNOSIS — I4729 Other ventricular tachycardia: Secondary | ICD-10-CM | POA: Diagnosis present

## 2013-08-09 DIAGNOSIS — I679 Cerebrovascular disease, unspecified: Secondary | ICD-10-CM | POA: Diagnosis present

## 2013-08-09 DIAGNOSIS — Z01810 Encounter for preprocedural cardiovascular examination: Secondary | ICD-10-CM

## 2013-08-09 DIAGNOSIS — J4489 Other specified chronic obstructive pulmonary disease: Secondary | ICD-10-CM | POA: Diagnosis present

## 2013-08-09 DIAGNOSIS — K8000 Calculus of gallbladder with acute cholecystitis without obstruction: Principal | ICD-10-CM | POA: Diagnosis present

## 2013-08-09 DIAGNOSIS — I472 Ventricular tachycardia, unspecified: Secondary | ICD-10-CM | POA: Diagnosis present

## 2013-08-09 DIAGNOSIS — N179 Acute kidney failure, unspecified: Secondary | ICD-10-CM | POA: Diagnosis present

## 2013-08-09 DIAGNOSIS — D638 Anemia in other chronic diseases classified elsewhere: Secondary | ICD-10-CM | POA: Diagnosis present

## 2013-08-09 DIAGNOSIS — R4182 Altered mental status, unspecified: Secondary | ICD-10-CM

## 2013-08-09 DIAGNOSIS — A498 Other bacterial infections of unspecified site: Secondary | ICD-10-CM | POA: Diagnosis present

## 2013-08-09 DIAGNOSIS — Z8673 Personal history of transient ischemic attack (TIA), and cerebral infarction without residual deficits: Secondary | ICD-10-CM

## 2013-08-09 DIAGNOSIS — H9190 Unspecified hearing loss, unspecified ear: Secondary | ICD-10-CM | POA: Diagnosis present

## 2013-08-09 DIAGNOSIS — G4733 Obstructive sleep apnea (adult) (pediatric): Secondary | ICD-10-CM | POA: Diagnosis present

## 2013-08-09 DIAGNOSIS — Z6826 Body mass index (BMI) 26.0-26.9, adult: Secondary | ICD-10-CM

## 2013-08-09 DIAGNOSIS — I2510 Atherosclerotic heart disease of native coronary artery without angina pectoris: Secondary | ICD-10-CM | POA: Diagnosis present

## 2013-08-09 DIAGNOSIS — I359 Nonrheumatic aortic valve disorder, unspecified: Secondary | ICD-10-CM | POA: Diagnosis present

## 2013-08-09 DIAGNOSIS — E785 Hyperlipidemia, unspecified: Secondary | ICD-10-CM | POA: Diagnosis present

## 2013-08-09 DIAGNOSIS — K819 Cholecystitis, unspecified: Secondary | ICD-10-CM

## 2013-08-09 DIAGNOSIS — D72829 Elevated white blood cell count, unspecified: Secondary | ICD-10-CM | POA: Diagnosis present

## 2013-08-09 DIAGNOSIS — I10 Essential (primary) hypertension: Secondary | ICD-10-CM

## 2013-08-09 DIAGNOSIS — E43 Unspecified severe protein-calorie malnutrition: Secondary | ICD-10-CM | POA: Diagnosis present

## 2013-08-09 DIAGNOSIS — Z87891 Personal history of nicotine dependence: Secondary | ICD-10-CM

## 2013-08-09 DIAGNOSIS — J9601 Acute respiratory failure with hypoxia: Secondary | ICD-10-CM | POA: Diagnosis present

## 2013-08-09 DIAGNOSIS — Z85828 Personal history of other malignant neoplasm of skin: Secondary | ICD-10-CM

## 2013-08-09 DIAGNOSIS — R509 Fever, unspecified: Secondary | ICD-10-CM

## 2013-08-09 DIAGNOSIS — K838 Other specified diseases of biliary tract: Secondary | ICD-10-CM | POA: Diagnosis present

## 2013-08-09 DIAGNOSIS — R0902 Hypoxemia: Secondary | ICD-10-CM

## 2013-08-09 DIAGNOSIS — Z85820 Personal history of malignant melanoma of skin: Secondary | ICD-10-CM

## 2013-08-09 DIAGNOSIS — J449 Chronic obstructive pulmonary disease, unspecified: Secondary | ICD-10-CM | POA: Diagnosis present

## 2013-08-09 DIAGNOSIS — I498 Other specified cardiac arrhythmias: Secondary | ICD-10-CM | POA: Diagnosis present

## 2013-08-09 DIAGNOSIS — I6350 Cerebral infarction due to unspecified occlusion or stenosis of unspecified cerebral artery: Secondary | ICD-10-CM

## 2013-08-09 DIAGNOSIS — I69359 Hemiplegia and hemiparesis following cerebral infarction affecting unspecified side: Secondary | ICD-10-CM | POA: Diagnosis present

## 2013-08-09 DIAGNOSIS — E876 Hypokalemia: Secondary | ICD-10-CM | POA: Diagnosis present

## 2013-08-09 DIAGNOSIS — I739 Peripheral vascular disease, unspecified: Secondary | ICD-10-CM | POA: Diagnosis present

## 2013-08-09 DIAGNOSIS — Z7901 Long term (current) use of anticoagulants: Secondary | ICD-10-CM

## 2013-08-09 DIAGNOSIS — H5440 Blindness, one eye, unspecified eye: Secondary | ICD-10-CM | POA: Diagnosis present

## 2013-08-09 DIAGNOSIS — J9600 Acute respiratory failure, unspecified whether with hypoxia or hypercapnia: Secondary | ICD-10-CM | POA: Diagnosis present

## 2013-08-09 LAB — CBC WITH DIFFERENTIAL/PLATELET
Basophils Absolute: 0 10*3/uL (ref 0.0–0.1)
Basophils Relative: 0 % (ref 0–1)
Eosinophils Absolute: 0 10*3/uL (ref 0.0–0.7)
Eosinophils Relative: 0 % (ref 0–5)
HCT: 38.6 % — ABNORMAL LOW (ref 39.0–52.0)
Hemoglobin: 12.7 g/dL — ABNORMAL LOW (ref 13.0–17.0)
Lymphocytes Relative: 4 % — ABNORMAL LOW (ref 12–46)
Lymphs Abs: 0.9 10*3/uL (ref 0.7–4.0)
MCH: 29.1 pg (ref 26.0–34.0)
MCHC: 32.9 g/dL (ref 30.0–36.0)
MCV: 88.3 fL (ref 78.0–100.0)
Monocytes Absolute: 1.8 K/uL — ABNORMAL HIGH (ref 0.1–1.0)
Monocytes Relative: 8 % (ref 3–12)
Neutro Abs: 20.3 K/uL — ABNORMAL HIGH (ref 1.7–7.7)
Neutrophils Relative %: 89 % — ABNORMAL HIGH (ref 43–77)
Platelets: 280 10*3/uL (ref 150–400)
RBC: 4.37 MIL/uL (ref 4.22–5.81)
RDW: 14.4 % (ref 11.5–15.5)
WBC: 23 K/uL — ABNORMAL HIGH (ref 4.0–10.5)

## 2013-08-09 LAB — LIPASE, BLOOD: Lipase: 13 U/L (ref 11–59)

## 2013-08-09 LAB — COMPREHENSIVE METABOLIC PANEL
BUN: 56 mg/dL — ABNORMAL HIGH (ref 6–23)
Calcium: 8.5 mg/dL (ref 8.4–10.5)
GFR calc Af Amer: 41 mL/min — ABNORMAL LOW (ref >90)
Glucose, Bld: 109 mg/dL — ABNORMAL HIGH (ref 70–99)
Total Protein: 6.6 g/dL (ref 6.0–8.3)

## 2013-08-09 LAB — COMPREHENSIVE METABOLIC PANEL WITH GFR
ALT: 22 U/L (ref 0–53)
AST: 28 U/L (ref 0–37)
Albumin: 2.5 g/dL — ABNORMAL LOW (ref 3.5–5.2)
Alkaline Phosphatase: 259 U/L — ABNORMAL HIGH (ref 39–117)
CO2: 25 meq/L (ref 19–32)
Chloride: 90 meq/L — ABNORMAL LOW (ref 96–112)
Creatinine, Ser: 1.64 mg/dL — ABNORMAL HIGH (ref 0.50–1.35)
GFR calc non Af Amer: 35 mL/min — ABNORMAL LOW (ref >90)
Potassium: 3.3 meq/L — ABNORMAL LOW (ref 3.5–5.1)
Sodium: 128 meq/L — ABNORMAL LOW (ref 135–145)
Total Bilirubin: 1 mg/dL (ref 0.3–1.2)

## 2013-08-09 LAB — PROTIME-INR
INR: 2.16 — ABNORMAL HIGH (ref 0.00–1.49)
Prothrombin Time: 23.4 seconds — ABNORMAL HIGH (ref 11.6–15.2)

## 2013-08-09 LAB — APTT: aPTT: 47 s — ABNORMAL HIGH (ref 24–37)

## 2013-08-09 LAB — CG4 I-STAT (LACTIC ACID): Lactic Acid, Venous: 1.27 mmol/L (ref 0.5–2.2)

## 2013-08-09 MED ORDER — SODIUM CHLORIDE 0.9 % IV BOLUS (SEPSIS): 500.0000 mL | Freq: Once | INTRAVENOUS | Status: DC

## 2013-08-09 MED ORDER — POLYETHYLENE GLYCOL 3350 17 G PO PACK
17.0000 g | PACK | Freq: Two times a day (BID) | ORAL | Status: DC
  Administered 2013-08-10 – 2013-08-13 (×4): 17 g via ORAL
  Filled 2013-08-09 (×9): qty 1

## 2013-08-09 MED ORDER — FAMOTIDINE 20 MG PO TABS
20.0000 mg | ORAL_TABLET | Freq: Every day | ORAL | Status: DC
  Administered 2013-08-10: 20 mg via ORAL
  Filled 2013-08-09: qty 1

## 2013-08-09 MED ORDER — SODIUM CHLORIDE 0.9 % IV BOLUS (SEPSIS)
500.0000 mL | Freq: Once | INTRAVENOUS | Status: AC
  Administered 2013-08-09: 1000 mL via INTRAVENOUS

## 2013-08-09 MED ORDER — PIPERACILLIN-TAZOBACTAM 3.375 G IVPB
3.3750 g | Freq: Three times a day (TID) | INTRAVENOUS | Status: DC
  Administered 2013-08-09 – 2013-08-13 (×11): 3.375 g via INTRAVENOUS
  Filled 2013-08-09 (×13): qty 50

## 2013-08-09 MED ORDER — SODIUM CHLORIDE 0.9 % IV SOLN
Freq: Once | INTRAVENOUS | Status: AC
  Administered 2013-08-09: 21:00:00 via INTRAVENOUS

## 2013-08-09 MED ORDER — PROMETHAZINE HCL 25 MG/ML IJ SOLN: 12.5000 mg | Freq: Four times a day (QID) | INTRAMUSCULAR | Status: DC | PRN

## 2013-08-09 MED ORDER — ONDANSETRON HCL 4 MG/2ML IJ SOLN: 4.0000 mg | Freq: Four times a day (QID) | INTRAMUSCULAR | Status: DC | PRN

## 2013-08-09 MED ORDER — MECLIZINE HCL 25 MG PO TABS
25.0000 mg | ORAL_TABLET | Freq: Three times a day (TID) | ORAL | Status: DC | PRN
  Filled 2013-08-09: qty 1

## 2013-08-09 MED ORDER — ACETAMINOPHEN 650 MG RE SUPP: 650.0000 mg | Freq: Four times a day (QID) | RECTAL | Status: DC | PRN

## 2013-08-09 MED ORDER — METOPROLOL TARTRATE 1 MG/ML IV SOLN: 5.0000 mg | Freq: Four times a day (QID) | INTRAVENOUS | Status: DC | PRN

## 2013-08-09 MED ORDER — BISACODYL 10 MG RE SUPP: 10.0000 mg | Freq: Two times a day (BID) | RECTAL | Status: DC | PRN

## 2013-08-09 MED ORDER — TRAMADOL HCL 50 MG PO TABS: 50.0000 mg | ORAL_TABLET | Freq: Four times a day (QID) | ORAL | Status: DC | PRN

## 2013-08-09 MED ORDER — ACETAMINOPHEN 325 MG PO TABS: 325.0000 mg | ORAL_TABLET | Freq: Four times a day (QID) | ORAL | Status: DC | PRN

## 2013-08-09 MED ORDER — LIP MEDEX EX OINT
1.0000 "application " | TOPICAL_OINTMENT | Freq: Two times a day (BID) | CUTANEOUS | Status: DC
  Administered 2013-08-10 – 2013-08-13 (×8): 1 via TOPICAL
  Filled 2013-08-09: qty 7

## 2013-08-09 MED ORDER — LACTATED RINGERS IV BOLUS (SEPSIS): 1000.0000 mL | Freq: Three times a day (TID) | INTRAVENOUS | Status: AC | PRN

## 2013-08-09 MED ORDER — DIPHENHYDRAMINE HCL 50 MG/ML IJ SOLN: 12.5000 mg | Freq: Four times a day (QID) | INTRAMUSCULAR | Status: DC | PRN

## 2013-08-09 MED ORDER — ALUM & MAG HYDROXIDE-SIMETH 200-200-20 MG/5ML PO SUSP: 30.0000 mL | Freq: Four times a day (QID) | ORAL | Status: DC | PRN

## 2013-08-09 MED ORDER — MAGNESIUM HYDROXIDE 400 MG/5ML PO SUSP
30.0000 mL | Freq: Two times a day (BID) | ORAL | Status: DC | PRN
Start: 2013-08-09 — End: 2013-08-13

## 2013-08-09 MED ORDER — MAGIC MOUTHWASH
15.0000 mL | Freq: Four times a day (QID) | ORAL | Status: DC | PRN
  Filled 2013-08-09: qty 15

## 2013-08-09 MED ORDER — ACETAMINOPHEN 325 MG PO TABS
650.0000 mg | ORAL_TABLET | Freq: Once | ORAL | Status: AC
  Administered 2013-08-09: 650 mg via ORAL
  Filled 2013-08-09: qty 2

## 2013-08-09 MED ORDER — FENTANYL CITRATE 0.05 MG/ML IJ SOLN: 25.0000 ug | INTRAMUSCULAR | Status: DC | PRN

## 2013-08-09 NOTE — Progress Notes (Signed)
ANTIBIOTIC CONSULT NOTE - INITIAL  Pharmacy Consult for Zosyn Indication: Cholelithiasis  Allergies  Allergen Reactions  . Sulfonamide Derivatives Rash    REACTION: rash   Patient Measurements:   Total Body Weight: 68 kg  Vital Signs: Temp: 99 F (37.2 C) (09/04 2114) Temp src: Oral (09/04 2114) BP: 100/60 mmHg (09/04 2114) Pulse Rate: 93 (09/04 2114) Intake/Output from previous day:   Intake/Output from this shift:    Labs:  Recent Labs  08/09/13 1749  WBC 23.0*  HGB 12.7*  PLT 280  CREATININE 1.64*   The CrCl is unknown because both a height and weight (above a minimum accepted value) are required for this calculation. No results found for this basename: VANCOTROUGH, VANCOPEAK, VANCORANDOM, GENTTROUGH, GENTPEAK, GENTRANDOM, TOBRATROUGH, TOBRAPEAK, TOBRARND, AMIKACINPEAK, AMIKACINTROU, AMIKACIN,  in the last 72 hours   Microbiology: No results found for this or any previous visit (from the past 720 hour(s)).  Medical History: Past Medical History  Diagnosis Date  . GERD (gastroesophageal reflux disease)   . Hypertension   . Hearing loss     wears hearing aid - right side  . Leg swelling   . Trouble swallowing   . Bruises easily     due to coumadin  . Weakness     difficulty walking  . Contact lens/glasses fitting   . Stroke 1999  . Cancer   . 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 Gait disturbance   . Dyslipidemia   . COPD (chronic obstructive pulmonary disease)   . Peripheral vascular disease   . History of melanoma   . Diverticulosis   . Abnormality of gait 05/16/2013  . Choledocholithiasis with obstruction 06/28/13  . Embolic stroke   . PAF (paroxysmal atrial fibrillation)   . Hyperlipidemia   . Bradycardia   . Protein-calorie malnutrition, severe   . Ventricular tachycardia (paroxysmal)   . Obstructive sleep apnea of adult     uses cpap, pt does not know setting  . Personal history of fall 05/29/2013  . Fracture of femoral neck, right, closed 3114  .  Long term (current) use of anticoagulants     PAF and embolic CVA  . Anemia, unspecified 07/05/2013  . Xerophthalmia 07/05/2013  . 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 Actinic keratosis   . Seborrheic keratosis    Medications:  Anti-infectives   Start     Dose/Rate Route Frequency Ordered Stop   08/09/13 2200  piperacillin-tazobactam (ZOSYN) IVPB 3.375 g     3.375 g 12.5 mL/hr over 240 Minutes Intravenous 3 times per day 08/09/13 2122       Assessment: 27 yoM, SNF resident with CBD stones, sent to ED for fever, confusion. Previous admit 06/29/13 with ERCP, sphincterotomy and stone extraction.  Begin Zosyn, pharmacy to dose  Cl ~ 30 ml/min  Goal of Therapy:  Abx dose/schedule appropriate for renal function and treatment of infection.  Plan:   Zosyn 3.375gm q8h  Otho Bellows PharmD Pager [phone] 08/09/2013, 9:35 PM

## 2013-08-09 NOTE — ED Provider Notes (Signed)
CSN: [id number]     Arrival date & time 08/09/13  1730 History   First MD Initiated Contact with Patient 08/09/13 1747     Chief Complaint  Patient presents with  . Altered Mental Status   (Consider location/radiation/quality/duration/timing/severity/associated sxs/prior Treatment) HPI Comments: Pt reports he feels similar to what he felt when it was discovered he had 3 gallstones that needed removal.  He didn't have surgery at the time.  He denies cough, diarrhea.  He denies abd pain, but some nasuea, intermittently.  Appetite is down some.  Level 5 caveat due to condition of patient.  Patient is a 79 y.o. male presenting with fever. The history is provided by the patient.  Fever Temp source:  Subjective Onset quality:  Gradual Duration:  1 day Timing:  Constant Progression:  Worsening Chronicity:  Recurrent Associated symptoms: confusion and nausea   Associated symptoms: no chest pain, no cough, no diarrhea and no vomiting     Past Medical History  Diagnosis Date  . GERD (gastroesophageal reflux disease)   . Hypertension   . Hearing loss     wears hearing aid - right side  . Leg swelling   . Trouble swallowing   . Bruises easily     due to coumadin  . Weakness     difficulty walking  . Contact lens/glasses fitting   . Stroke 1999  . Cancer   . 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 Gait disturbance   . Dyslipidemia   . COPD (chronic obstructive pulmonary disease)   . Peripheral vascular disease   . History of melanoma   . Diverticulosis   . Abnormality of gait 05/16/2013  . Choledocholithiasis with obstruction 06/28/13  . Embolic stroke   . PAF (paroxysmal atrial fibrillation)   . Hyperlipidemia   . Bradycardia   . Protein-calorie malnutrition, severe   . Ventricular tachycardia (paroxysmal)   . Obstructive sleep apnea of adult     uses cpap, pt does not know setting  . Personal history of fall 05/29/2013  . Fracture of femoral neck, right, closed 3114  . Long term (current)  use of anticoagulants     PAF and embolic CVA  . Anemia, unspecified 07/05/2013  . Xerophthalmia 07/05/2013  . 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 Actinic keratosis   . Seborrheic keratosis    Past Surgical History  Procedure Laterality Date  . Excision of melanoma  2009    left leg  . Melanoma excision      many melanoma removed in past  . Melanoma excision  10/11/2011    Procedure: MELANOMA EXCISION;  Surgeon: Valarie Merino, MD;  Location: [facility];  Service: General;  Laterality: Left;  EXCISION melanoma left leg with full thickness skin grafting from left lower abdomen.  . Joint replacement  2012    r fem head fx  . Lung benign area removed   1970's  . Hernia repair  1983  . Melanoma excision  05/16/2012    Procedure: MELANOMA EXCISION;  Surgeon: Valarie Merino, MD;  Location: [facility];  Service: General;  Laterality: Left;  Nodule Removal of Melanoma on Left Shin  . Ercp N/A 07/04/2013    Procedure: ENDOSCOPIC RETROGRADE CHOLANGIOPANCREATOGRAPHY (ERCP);  Surgeon: Hilarie Fredrickson, MD;  Location: [facility];  Service: Endoscopy;  Laterality: N/A;   Family History  Problem Relation Age of Onset  . Heart disease Father 11  . Cancer Son     prostate  . Dementia Sister  One sister has dementia   History  Substance Use Topics  . Smoking status: Former Smoker    Types: Pipe    Quit date: 08/31/1964  . Smokeless tobacco: Never Used  . Alcohol Use: No    Review of Systems  Unable to perform ROS: Acuity of condition  Constitutional: Positive for fever.  Respiratory: Negative for cough.   Cardiovascular: Negative for chest pain.  Gastrointestinal: Positive for nausea. Negative for vomiting and diarrhea.  Psychiatric/Behavioral: Positive for confusion.    Allergies  Sulfonamide derivatives  Home Medications   Current Outpatient Rx  Name  Route  Sig  Dispense  Refill  . acetaminophen (TYLENOL) 325 MG tablet   Oral   Take 650 mg by mouth every 6 (six) hours as needed for pain.           . furosemide (LASIX) 20 MG tablet      20 mg every other day.          . hydroxypropyl methylcellulose (ISOPTO TEARS) 2.5 % ophthalmic solution   Both Eyes   Place 1 drop into both eyes 4 (four) times daily as needed (eye irritation).          . meclizine (ANTIVERT) 25 MG tablet   Oral   Take 25 mg by mouth 3 (three) times daily as needed for dizziness.          Marland Kitchen omeprazole (PRILOSEC) 20 MG capsule   Oral   Take 20 mg by mouth daily.         . ranitidine (ZANTAC) 150 MG tablet   Oral   Take 150 mg by mouth at bedtime.         Marland Kitchen warfarin (COUMADIN) 4 MG tablet   Oral   Take 4 mg by mouth daily.          BP 100/60  Pulse 93  Temp(Src) 99 F (37.2 C) (Oral)  Resp 21  SpO2 98% Physical Exam  Nursing note and vitals reviewed. Constitutional: He is oriented to person, place, and time. He appears well-developed and well-nourished. No distress.  HENT:  Head: Normocephalic and atraumatic.  Eyes: EOM are normal.  Neck: Normal range of motion. Neck supple. No Brudzinski's sign and no Kernig's sign noted.  Cardiovascular: Normal rate and intact distal pulses.   No murmur heard. Pulmonary/Chest: Effort normal and breath sounds normal. He has no wheezes. He has no rales.  Abdominal: Soft. There is no tenderness. There is no rebound and no guarding.  Musculoskeletal: He exhibits no edema.  Neurological: He is alert and oriented to person, place, and time. He exhibits normal muscle tone. Coordination normal.  Skin: Skin is warm. No rash noted. He is not diaphoretic.    ED Course  Procedures (including critical care time) Labs Review Labs Reviewed  CBC WITH DIFFERENTIAL - Abnormal; Notable for the following:    WBC 23.0 (*)    Hemoglobin 12.7 (*)    HCT 38.6 (*)    Neutrophils Relative % 89 (*)    Neutro Abs 20.3 (*)    Lymphocytes Relative 4 (*)    Monocytes Absolute 1.8 (*)    All other components within normal limits  COMPREHENSIVE METABOLIC  PANEL - Abnormal; Notable for the following:    Sodium 128 (*)    Potassium 3.3 (*)    Chloride 90 (*)    Glucose, Bld 109 (*)    BUN 56 (*)    Creatinine, Ser 1.64 (*)  Albumin 2.5 (*)    Alkaline Phosphatase 259 (*)    GFR calc non Af Amer 35 (*)    GFR calc Af Amer 41 (*)    All other components within normal limits  CULTURE, BLOOD (ROUTINE X 2)  CULTURE, BLOOD (ROUTINE X 2)  URINE CULTURE  LIPASE, BLOOD  URINALYSIS, ROUTINE W REFLEX MICROSCOPIC  APTT  PROTIME-INR  CG4 I-STAT (LACTIC ACID)   Imaging Review US Abdomen Complete  08/09/2013   *RADIOLOGY REPORT*  Clinical Data:  Nausea and fever.  COMPLETE ABDOMINAL ULTRASOUND  Comparison:  [place] 06/28/2013.  Findings:  Gallbladder:  New gallbladder wall thickening, 8 mm in maximal thickness, with mild striated appearance.  There is layering sludge and gallstones.  Noted gallstone in the neck of the gallbladder appears fixed. No sonographic Murphy's sign.  Common bile duct:  8 mm diameter.  There is also intrahepatic biliary ductal dilatation with multiple bright spectral reflectors that shadow.  This is consistent with pneumobilia in a patient status post sphincterotomy. Dilation is less than prior.  Twinkle artifact within the CBD, without bright reflector.  Liver:  No focal lesion identified.  Within normal limits in parenchymal echogenicity.  IVC:  Normal where seen.  Pancreas:  Normal where seen.  No pancreatic ductal dilatation.  Spleen:  Measures 10 cm.  Right Kidney:  Diffuse cortical thinning with a 10 cm length.  No hydronephrosis.  Left Kidney:  Diffuse cortical thinning within a 10 cm length.  No hydronephrosis.  Abdominal aorta:  No aneurysm where seen.  Maximal diameter proximally, 3.1 cm.  IMPRESSION: 1.  Cholelithiasis and new gallbladder wall thickening.  Although there is no sonographic Murphy's sign, findings could represent acute cholecystitis.  Consider biliary scintigraphy if there is clinical uncertainty. 2.  From July  2014, improved biliary ductal dilatation status post sphincterotomy and CBD stone extraction.  Pneumobilia decreases sensitivity for detecting current choledocholithiasis.   Original Report Authenticated By: Tiburcio Pea   Dg Chest Port 1 View  08/09/2013   *RADIOLOGY REPORT*  Clinical Data: Fever, weakness  PORTABLE CHEST - 1 VIEW  Comparison: Prior chest x-ray 07/04/2013  Findings: Stable cardiomegaly with left ventricular prominence. Atherosclerotic calcifications noted in the transverse aorta. Inspiratory volumes remain low.  There are bibasilar opacities which are similar compared to prior.  Surgical changes suggest prior thoracotomy with partial resection of the right fifth rib. No definite new airspace consolidation, pneumothorax or pulmonary edema.  Similar appearance of blunting of the right costophrenic angle which may reflect a small pleural effusion.  No acute osseous abnormality.  IMPRESSION:  Essentially stable chest x-ray without evidence of new acute cardiopulmonary process.  Stable pleural thickening versus small right pleural effusion.   Original Report Authenticated By: Malachy Moan, M.D.   ra  Sat is 98% and I interpret to be adequate    10:10 PM Pt's WBC is very eelvated, concerning, alk phos is up, no elevation of other LFT's.  However, U/S is concerning for GB wall thickening, possibly acute cholecystitis.  Will consult surgery, Zosyn was ordered after cultures.  Likely medicine will be involved as well.     MDM   1. Cholecystitis      Pt with oral temp of 101, not toxic appearing, normotensive.  Abd is soft, but given recent issue with bilary stones, will obtain labs, cultures, and abd U/S.  In review of prior notes, pt had CBD dilatation and 1 large single GB stone, no wall thickening.  Pt was admitted at  end of July.  He later had sphincterotomy and removal of stone.      Gavin Pound. Oletta Lamas, MD 08/09/13 2228

## 2013-08-09 NOTE — H&P (Signed)
[facility] History and Physical  [name] ZOX:[id number] DOB: Jun 16, 1919    PCP:   [name], MD   Chief Complaint: confusion, nausea, loss of appetite.  HPI: [name] is an 79 y.o. male with multiple medical problems including Vtach, afib on coumadin, prior CVA, dysphagia, HTN, melanoma, Sleep apnea on CPAP, hx of sinus bradycardia excluding the use of chronic neg chronotrophs, admitted recently for choledocholithiasis and cholecystitis, s/p ERCP with splinterotomy and stone removal, brought in from ALF with confusion and fever.  Here he is lucid.  He also complained of nausea, vomited once, and having no appetite.  Work up in the included a marked leukocytosis with WBC of 23K, and Temp of 101, with normal LFTs except mild Akphos elevation of 259.  He has been on coumadin with INR pending.  Surgery has been consulted by EDP and Dr Michaell Cowing will help with consultation.  Hospitalist was asked to admit him, maximize medical condition to clear him for anticipatory CCY this admission.  Rewiew of Systems:  Constitutional: No significant weight loss or weight gain Eyes: Negative for eye pain, redness and discharge, diplopia, visual changes, or flashes of light. ENMT: Negative for ear pain, hoarseness, nasal congestion, sinus pressure and sore throat. No headaches; tinnitus, drooling, or problem swallowing. Cardiovascular: Negative for chest pain, palpitations, diaphoresis, dyspnea and peripheral edema. ; No orthopnea, PND Respiratory: Negative for cough, hemoptysis, wheezing and stridor. No pleuritic chestpain. Gastrointestinal: Negative for vomiting, diarrhea, constipation, melena, blood in stool, hematemesis, jaundice and rectal bleeding.    Genitourinary: Negative for frequency, dysuria, incontinence,flank pain and hematuria; Musculoskeletal: Negative for back pain and neck pain. Negative for swelling and trauma.;  Skin: . Negative for pruritus, rash, abrasions, bruising and skin  lesion.; ulcerations Neuro: Negative for headache, lightheadedness and neck stiffness. Negative for weakness, altered level of consciousness , altered mental status, extremity weakness, burning feet, involuntary movement, seizure and syncope.  Psych: negative for anxiety, depression, insomnia, tearfulness, panic attacks, hallucinations, paranoia, suicidal or homicidal ideation    Past Medical History  Diagnosis Date  . GERD (gastroesophageal reflux disease)   . Hypertension   . Hearing loss     wears hearing aid - right side  . Leg swelling   . Trouble swallowing   . Bruises easily     due to coumadin  . Weakness     difficulty walking  . Contact lens/glasses fitting   . Stroke 1999  . Cancer   . 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 Gait disturbance   . Dyslipidemia   . COPD (chronic obstructive pulmonary disease)   . Peripheral vascular disease   . History of melanoma   . Diverticulosis   . Abnormality of gait 05/16/2013  . Choledocholithiasis with obstruction 06/28/13  . Embolic stroke   . PAF (paroxysmal atrial fibrillation)   . Hyperlipidemia   . Bradycardia   . Protein-calorie malnutrition, severe   . Ventricular tachycardia (paroxysmal)   . Obstructive sleep apnea of adult     uses cpap, pt does not know setting  . Personal history of fall 05/29/2013  . Fracture of femoral neck, right, closed 3114  . Long term (current) use of anticoagulants     PAF and embolic CVA  . Anemia, unspecified 07/05/2013  . Xerophthalmia 07/05/2013  . 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 Actinic keratosis   . Seborrheic keratosis     Past Surgical History  Procedure Laterality Date  . Excision of melanoma  2009  left leg  . Melanoma excision      many melanoma removed in past  . Melanoma excision  10/11/2011    Procedure: MELANOMA EXCISION;  Surgeon: Valarie Merino, MD;  Location: [facility];  Service: General;  Laterality: Left;  EXCISION melanoma left leg with full thickness skin grafting from left  lower abdomen.  . Joint replacement  2012    r fem head fx  . Lung benign area removed   1970's  . Hernia repair  1983  . Melanoma excision  05/16/2012    Procedure: MELANOMA EXCISION;  Surgeon: Valarie Merino, MD;  Location: [facility];  Service: General;  Laterality: Left;  Nodule Removal of Melanoma on Left Shin  . Ercp N/A 07/04/2013    Procedure: ENDOSCOPIC RETROGRADE CHOLANGIOPANCREATOGRAPHY (ERCP);  Surgeon: Hilarie Fredrickson, MD;  Location: [facility];  Service: Endoscopy;  Laterality: N/A;    Medications:  HOME MEDS: Prior to Admission medications   Medication Sig Start Date End Date Taking? Authorizing Provider  acetaminophen (TYLENOL) 325 MG tablet Take 650 mg by mouth every 6 (six) hours as needed for pain.    Yes Historical Provider, MD  furosemide (LASIX) 20 MG tablet 20 mg every other day.  03/18/13  Yes Nyoka Cowden, MD  hydroxypropyl methylcellulose (ISOPTO TEARS) 2.5 % ophthalmic solution Place 1 drop into both eyes 4 (four) times daily as needed (eye irritation).    Yes Historical Provider, MD  meclizine (ANTIVERT) 25 MG tablet Take 25 mg by mouth 3 (three) times daily as needed for dizziness.    Yes Historical Provider, MD  omeprazole (PRILOSEC) 20 MG capsule Take 20 mg by mouth daily.   Yes Historical Provider, MD  ranitidine (ZANTAC) 150 MG tablet Take 150 mg by mouth at bedtime.   Yes Historical Provider, MD  warfarin (COUMADIN) 4 MG tablet Take 4 mg by mouth daily.   Yes Historical Provider, MD     Allergies:  Allergies  Allergen Reactions  . Sulfonamide Derivatives Rash    REACTION: rash    Social History:   reports that he quit smoking about 48 years ago. His smoking use included Pipe. He has never used smokeless tobacco. He reports that he does not drink alcohol or use illicit drugs.  Family History: Family History  Problem Relation Age of Onset  . Heart disease Father 56  . Cancer Son     prostate  . Dementia Sister     One sister has dementia      Physical Exam: Filed Vitals:   08/09/13 1735 08/09/13 1736 08/09/13 2114  BP:  120/60 100/60  Pulse:  92 93  Temp:  101.2 F (38.4 C) 99 F (37.2 C)  TempSrc:  Oral Oral  Resp:  18 21  SpO2: 96% 98% 98%   Blood pressure 100/60, pulse 93, temperature 99 F (37.2 C), temperature source Oral, resp. rate 21, SpO2 98.00%.  GEN:  Pleasant  patient lying in the stretcher in no acute distress; cooperative with exam. PSYCH:  alert and oriented x4; does not appear anxious or depressed; affect is appropriate. HEENT: Mucous membranes pink and anicteric; Left eye blindness.  no cervical lymphadenopathy nor thyromegaly or carotid bruit; no JVD; There were no stridor. Neck is very supple. Breasts:: Not examined CHEST WALL: No tenderness CHEST: Normal respiration, clear to auscultation bilaterally.  HEART: Regular rate and rhythm.  There are no murmur, rub, or gallops.   BACK: No kyphosis or scoliosis; no CVA tenderness ABDOMEN: soft  and non-tender; no masses, no organomegaly, normal abdominal bowel sounds; no pannus; no intertriginous candida. There is no rebound and no distention. Rectal Exam: Not done EXTREMITIES: No bone or joint deformity; age-appropriate arthropathy of the hands and knees; no edema; no ulcerations.  There is no calf tenderness. Genitalia: not examined PULSES: 2+ and symmetric SKIN: Normal hydration no rash or ulceration CNS: Cranial nerves 2-12 grossly intact no focal lateralizing neurologic deficit.  Speech is fluent; uvula elevated with phonation, facial symmetry and tongue midline. DTR are normal bilaterally, cerebella exam is intact, barbinski is negative and left facial droop, right sided weakness.  No sensory loss.   Labs on Admission:  Basic Metabolic Panel:  Recent Labs Lab 08/09/13 1749  NA 128*  K 3.3*  CL 90*  CO2 25  GLUCOSE 109*  BUN 56*  CREATININE 1.64*  CALCIUM 8.5   Liver Function Tests:  Recent Labs Lab 08/09/13 1749  AST 28  ALT 22   ALKPHOS 259*  BILITOT 1.0  PROT 6.6  ALBUMIN 2.5*    Recent Labs Lab 08/09/13 1805  LIPASE 13   No results found for this basename: AMMONIA,  in the last 168 hours CBC:  Recent Labs Lab 08/09/13 1749  WBC 23.0*  NEUTROABS 20.3*  HGB 12.7*  HCT 38.6*  MCV 88.3  PLT 280   Cardiac Enzymes: No results found for this basename: CKTOTAL, CKMB, CKMBINDEX, TROPONINI,  in the last 168 hours  CBG: No results found for this basename: GLUCAP,  in the last 168 hours   Radiological Exams on Admission: US Abdomen Complete  08/09/2013   *RADIOLOGY REPORT*  Clinical Data:  Nausea and fever.  COMPLETE ABDOMINAL ULTRASOUND  Comparison:  [place] 06/28/2013.  Findings:  Gallbladder:  New gallbladder wall thickening, 8 mm in maximal thickness, with mild striated appearance.  There is layering sludge and gallstones.  Noted gallstone in the neck of the gallbladder appears fixed. No sonographic Murphy's sign.  Common bile duct:  8 mm diameter.  There is also intrahepatic biliary ductal dilatation with multiple bright spectral reflectors that shadow.  This is consistent with pneumobilia in a patient status post sphincterotomy. Dilation is less than prior.  Twinkle artifact within the CBD, without bright reflector.  Liver:  No focal lesion identified.  Within normal limits in parenchymal echogenicity.  IVC:  Normal where seen.  Pancreas:  Normal where seen.  No pancreatic ductal dilatation.  Spleen:  Measures 10 cm.  Right Kidney:  Diffuse cortical thinning with a 10 cm length.  No hydronephrosis.  Left Kidney:  Diffuse cortical thinning within a 10 cm length.  No hydronephrosis.  Abdominal aorta:  No aneurysm where seen.  Maximal diameter proximally, 3.1 cm.  IMPRESSION: 1.  Cholelithiasis and new gallbladder wall thickening.  Although there is no sonographic Murphy's sign, findings could represent acute cholecystitis.  Consider biliary scintigraphy if there is clinical uncertainty. 2.  From July 2014, improved  biliary ductal dilatation status post sphincterotomy and CBD stone extraction.  Pneumobilia decreases sensitivity for detecting current choledocholithiasis.   Original Report Authenticated By: Tiburcio Pea   Dg Chest Port 1 View  08/09/2013   *RADIOLOGY REPORT*  Clinical Data: Fever, weakness  PORTABLE CHEST - 1 VIEW  Comparison: Prior chest x-ray 07/04/2013  Findings: Stable cardiomegaly with left ventricular prominence. Atherosclerotic calcifications noted in the transverse aorta. Inspiratory volumes remain low.  There are bibasilar opacities which are similar compared to prior.  Surgical changes suggest prior thoracotomy with partial resection of  the right fifth rib. No definite new airspace consolidation, pneumothorax or pulmonary edema.  Similar appearance of blunting of the right costophrenic angle which may reflect a small pleural effusion.  No acute osseous abnormality.  IMPRESSION:  Essentially stable chest x-ray without evidence of new acute cardiopulmonary process.  Stable pleural thickening versus small right pleural effusion.   Original Report Authenticated By: Malachy Moan, M.D.   Assessment/Plan Present on Admission:  . Acute cholecystitis . Atrial fibrillation . Cerebrovascular disease, unspecified . Choledocholithiasis with obstruction s/p ERCP [id number] . DNR (do not resuscitate) . HYPERTENSION . Obstructive sleep apnea . Ventricular tachycardia, non-sustained . PVD  PLAN:  This patient has acute cholecystitis, with recent choledocholithiasis undergone ERCP with stone removal, returned today with acute cholecytitis again.  He will need CCY, but he require very high risk medical clearance.  I will d/c his coumadin and put him on subQ heparin, make NPO, and give IV antibiotics.  He will need cardiology to see him to maximize his cardiovascular status prior to going to surgery (ECHO with WMA, hx of nonsustain Vtach, afib, HTN...etc.).  He was seen by Dr Reine Just as the initial  consult, and it was recommended that he be treated conservatively, to avoid using betablocker or calcium blocker due to his hx of bradycardia.  He also has CKD and will avoid volume depletion along with nephrotoxic drugs.  Will admit him to telemetry under [facility].  He is a DNR.  Thank you for asking me to participate in his care.  Other plans as per orders.  Code Status: DNR.   [name], MD. [facility] Pager [phone] 7pm to 7am.  08/09/2013, 11:34 PM

## 2013-08-09 NOTE — Assessment & Plan Note (Signed)
ER evaluation--hx of cholelithiasis with similar presentation of fever and decreased appetite.

## 2013-08-09 NOTE — Assessment & Plan Note (Signed)
O2 85% RA--hx of COPD and saw Pulmonology Dr. [name].

## 2013-08-09 NOTE — ED Notes (Addendum)
Pt unable to void  in urinal attempted to in and out lots of resistance small amt of blood Dr Oletta Lamas aware

## 2013-08-09 NOTE — Progress Notes (Signed)
Patient ID: [name], male   DOB: 11/01/1919, 79 y.o.   MRN: [id number] Code Status: DNR  Allergies  Allergen Reactions  . Sulfonamide Derivatives Rash    REACTION: rash    Chief Complaint  Patient presents with  . Medical Managment of Chronic Issues    AMS, O2 desaturation, fever, poor oral intake.     HPI: Patient is a 79 y.o. male seen in the SNF at [facility] today for evaluation confusion, fever, decreased appetite for 1 day. He has history of A. fib on Coumadin, malignant melanoma, right femoral neck fracture, embolic CVA, OSA, HTN, HL, mechanical fall on 05/29/13, was sent to the [facility] ED [facility] on 06/28/13 with complaints of an episode of vomiting and fever of 103.44F. Patient also gave history of one-2 weeks of decreased appetite but no abdominal pain prior to ED . In the ED, WBC 24.5, alkaline phosphatase 866 & abdominal ultrasound which showed marked intra-and extrahepatic biliary dietitian without obstructing stone or lesion. The CT abdomen 06/29/13 shows intra-and extrahepatic bililary dilatation. - was briefly on empiric IV Unasyn. - after anticoagulation was reversed with Vitamin K, he underwent ERCP with sphincterotomy and successful stone extraction. -CLinically asymptomatic and improved since, LFTS improved, patient declines cholecystectomy while in hospital and will Fu with Dr.Ingram in few weeks to discuss this.      Problem List Items Addressed This Visit   Altered mental status     Confused and wander off--UA pending. Hx of cholelithiasis--with similar presentation-fever, poor appetite, confusion--ER evaluation.     Atrial fibrillation     Rate controlled without rhythm agent. Chronic anticoagulation therapy with Coumadin for risk reduction of thromboembolic event      CVA (cerebral vascular accident)     Hx. Right sided weakness even if grip strength is 5/5, contracted the right elbow/wrist/hand.       Fever,  unspecified     ER evaluation--hx of cholelithiasis with similar presentation of fever and decreased appetite.     GERD     Takes Omeprazole 20mg  and Ranitidine 150mg        HYPERTENSION     Controlled on Lasix 20mg     Oxygen desaturation - Primary     O2 85% RA--hx of COPD and saw Pulmonology Dr. [name].        Review of Systems:  Review of Systems  Constitutional: Positive for fever and weight loss. Negative for chills, malaise/fatigue and diaphoresis.  HENT: Positive for hearing loss. Negative for ear pain, nosebleeds, congestion, sore throat, neck pain, tinnitus and ear discharge.   Eyes: Negative for blurred vision, double vision, photophobia, pain, discharge and redness.       Left eye blindness  Respiratory: Negative for cough, hemoptysis, sputum production, shortness of breath, wheezing and stridor.   Cardiovascular: Positive for leg swelling (L>R). Negative for chest pain, palpitations, orthopnea, claudication and PND.  Gastrointestinal: Negative for heartburn, nausea, vomiting, abdominal pain, diarrhea, constipation, blood in stool and melena.  Genitourinary: Negative for dysuria, urgency, frequency, hematuria and flank pain.  Musculoskeletal: Negative for myalgias, back pain, joint pain and falls.  Skin: Negative for itching and rash (buttocks diaper rash).  Neurological: Positive for focal weakness (left sided weakness) and weakness (left sided and genralized). Negative for dizziness, tingling, tremors, sensory change, speech change, seizures, loss of consciousness and headaches.  Endo/Heme/Allergies: Negative for environmental allergies and polydipsia. Bruises/bleeds easily.  Psychiatric/Behavioral: Negative for depression, suicidal ideas, hallucinations, memory loss and substance  abuse. The patient is not nervous/anxious and does not have insomnia.      Past Medical History  Diagnosis Date  . GERD (gastroesophageal reflux disease)   . Hypertension   . Hearing loss      wears hearing aid - right side  . Leg swelling   . Trouble swallowing   . Bruises easily     due to coumadin  . Weakness     difficulty walking  . Contact lens/glasses fitting   . Stroke 1999  . Cancer   . 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 Gait disturbance   . Dyslipidemia   . COPD (chronic obstructive pulmonary disease)   . Peripheral vascular disease   . History of melanoma   . Diverticulosis   . Abnormality of gait 05/16/2013  . Choledocholithiasis with obstruction 06/28/13  . Embolic stroke   . PAF (paroxysmal atrial fibrillation)   . Hyperlipidemia   . Bradycardia   . Protein-calorie malnutrition, severe   . Ventricular tachycardia (paroxysmal)   . Obstructive sleep apnea of adult     uses cpap, pt does not know setting  . Personal history of fall 05/29/2013  . Fracture of femoral neck, right, closed 3114  . Long term (current) use of anticoagulants     PAF and embolic CVA  . Anemia, unspecified 07/05/2013  . Xerophthalmia 07/05/2013  . 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 Actinic keratosis   . Seborrheic keratosis    Past Surgical History  Procedure Laterality Date  . Excision of melanoma  2009    left leg  . Melanoma excision      many melanoma removed in past  . Melanoma excision  10/11/2011    Procedure: MELANOMA EXCISION;  Surgeon: Valarie Merino, MD;  Location: [facility] OR;  Service: General;  Laterality: Left;  EXCISION melanoma left leg with full thickness skin grafting from left lower abdomen.  . Joint replacement  2012    r fem head fx  . Lung benign area removed   1970's  . Hernia repair  1983  . Melanoma excision  05/16/2012    Procedure: MELANOMA EXCISION;  Surgeon: Valarie Merino, MD;  Location: [facility]S;  Service: General;  Laterality: Left;  Nodule Removal of Melanoma on Left Shin  . Ercp N/A 07/04/2013    Procedure: ENDOSCOPIC RETROGRADE CHOLANGIOPANCREATOGRAPHY (ERCP);  Surgeon: Hilarie Fredrickson, MD;  Location: [facility];  Service: Endoscopy;  Laterality: N/A;    Social History:   reports that he quit smoking about 48 years ago. His smoking use included Pipe. He has never used smokeless tobacco. He reports that he does not drink alcohol or use illicit drugs.  Family History  Problem Relation Age of Onset  . Heart disease Father 71  . Cancer Son     prostate  . Dementia Sister     One sister has dementia    Medications: Patient's Medications  New Prescriptions   No medications on file  Previous Medications   ACETAMINOPHEN (TYLENOL) 325 MG TABLET    Take 650 mg by mouth every 6 (six) hours as needed for pain.    FUROSEMIDE (LASIX) 20 MG TABLET    20 mg every other day.    HYDROXYPROPYL METHYLCELLULOSE (ISOPTO TEARS) 2.5 % OPHTHALMIC SOLUTION    Place 1 drop into both eyes 4 (four) times daily as needed (eye irritation).    MECLIZINE (ANTIVERT) 25 MG TABLET    Take 12.5-25 mg by mouth 3 (three) times  daily as needed for dizziness.    OMEPRAZOLE (PRILOSEC) 20 MG CAPSULE    Take 20 mg by mouth daily.   RANITIDINE (ZANTAC) 150 MG TABLET    Take 150 mg by mouth at bedtime.   WARFARIN (COUMADIN) 3 MG TABLET    Take 3 mg by mouth as directed.  Modified Medications   No medications on file  Discontinued Medications   No medications on file     Physical Exam: Physical Exam  Constitutional: He is oriented to person, place, and time. He appears well-developed and well-nourished. No distress.  HENT:  Head: Normocephalic and atraumatic.  Right Ear: External ear normal.  Left Ear: External ear normal.  Nose: Nose normal.  Mouth/Throat: Oropharynx is clear and moist. No oropharyngeal exudate.  Eyes: Conjunctivae are normal. Pupils are equal, round, and reactive to light. Right eye exhibits no discharge. No scleral icterus.  Left eye blindness  Neck: Normal range of motion. Neck supple. No JVD present. No tracheal deviation present. No thyromegaly present.  Cardiovascular: Normal rate and normal heart sounds.  An irregular rhythm present.   Pulmonary/Chest: Effort normal and breath sounds normal. No stridor. No respiratory distress. He has no wheezes. He has no rales. He exhibits no tenderness.  Abdominal: He exhibits no distension. There is no tenderness. There is no rebound and no guarding.  Musculoskeletal: Normal range of motion. He exhibits edema (L>R). He exhibits no tenderness.  Lymphadenopathy:    He has no cervical adenopathy.  Neurological: He is alert and oriented to person, place, and time. He displays abnormal reflex. No cranial nerve deficit. Coordination normal.  Skin: Skin is warm and dry. No rash noted. He is not diaphoretic. No erythema. No pallor.  Psychiatric: Thought content normal. His mood appears anxious. His affect is not angry and not inappropriate. His speech is not rapid and/or pressured, not delayed and not slurred. He is agitated. He is not aggressive, not hyperactive, not slowed and not withdrawn. Cognition and memory are impaired. He expresses impulsivity and inappropriate judgment. He does not exhibit a depressed mood. He exhibits abnormal recent memory. He exhibits normal remote memory.    Filed Vitals:   08/09/13 1731  BP: 110/67  Pulse: 63  Temp: 97.5 F (36.4 C)  TempSrc: Tympanic  Resp: 16  SpO2: 85%      Labs reviewed: Basic Metabolic Panel:  Recent Labs  54/09/81 1820 06/30/13 0500 07/02/13 0430 07/05/13 0417  NA  --  134* 133* 136  K  --  4.2 3.6 3.7  CL  --  102 99 96  CO2  --  24 26 26   GLUCOSE  --  76 80 64*  BUN  --  16 15 14   CREATININE  --  0.86 0.91 0.91  CALCIUM  --  8.1* 8.3* 8.3*  MG 1.8  --   --   --    Liver Function Tests:  Recent Labs  06/30/13 0500 07/02/13 0430 07/05/13 0417  AST 48* 69* 43*  ALT 44 64* 43  ALKPHOS 647* 621* 570*  BILITOT 0.8 2.8* 1.6*  PROT 5.1* 5.3* 5.5*  ALBUMIN 1.9* 1.9* 2.2*    Recent Labs  06/28/13 2114  LIPASE 18   CBC:  Recent Labs  05/29/13 1616 06/28/13 2114  06/30/13 0500 07/02/13 0430  07/05/13 0417  WBC 10.6* 24.5*  < > 9.1 8.6 7.8  NEUTROABS 9.0* 22.8*  --   --   --   --   HGB 13.3 11.2*  < >  10.8* 11.3* 11.4*  HCT 40.3 34.3*  < > 33.3* 35.2* 35.6*  MCV 89.2 89.1  < > 90.2 88.9 89.4  PLT 340 345  < > 351 338 348  < > = values in this interval not displayed. Lipid Panel: No results found for this basename: CHOL, HDL, LDLCALC, TRIG, CHOLHDL, LDLDIRECT,  in the last 8760 hours Anemia Panel: No results found for this basename: FOLATE, IRON, VITAMINB12,  in the last 8760 hours  Past Procedures: 06/28/13 [place] abd IMPRESSION:  1. Marked intra and extrahepatic biliary dilation. No obstructing  lesion or stone is identified. The common bile duct is dilated  into the pancreatic head.  2. Bilateral hyperechoic kidneys with thinning of the parenchyma.  This is compatible with nonspecific medical renal disease.  3. Single large gallstone without evidence for acute  cholecystitis.   06/29/13 2 D echocardiogram: EF 55%  06/29/13 MRI abd w/o CM    Assessment/PlanIMPRESSION:  1. Two obstructing stones within the common bile duct and a large  stone at the confluence of the cystic duct and the common hepatic  duct. There is significant biliary obstruction t proximal to the  stones.  2. Single gallstone within the gallbladder. Cystic duct is also  dilated.  3. Multiple cysts throughout the pancreas suggest chronic  pancreatitis.  Oxygen desaturation O2 85% RA--hx of COPD and saw Pulmonology Dr. [name].   Altered mental status Confused and wander off--UA pending. Hx of cholelithiasis--with similar presentation-fever, poor appetite, confusion--ER evaluation.   Atrial fibrillation Rate controlled without rhythm agent. Chronic anticoagulation therapy with Coumadin for risk reduction of thromboembolic event    CVA (cerebral vascular accident) Hx. Right sided weakness even if grip strength is 5/5, contracted the right elbow/wrist/hand.     GERD Takes Omeprazole 20mg  and  Ranitidine 150mg      HYPERTENSION Controlled on Lasix 20mg   Fever, unspecified ER evaluation--hx of cholelithiasis with similar presentation of fever and decreased appetite.     Family/ Staff Communication: observe the patient.   Goals of Care: SNF  Labs/tests ordered: ER evaluate.

## 2013-08-09 NOTE — ED Notes (Signed)
Bed: WA07 Expected date:  Expected time:  Means of arrival:  Comments: ems- 79 yo, low grade fever

## 2013-08-09 NOTE — Consult Note (Signed)
[name]  02/08/1919 [id number]  CARE TEAM:  PCP: [name], MD  Outpatient Care Team: Patient Care Team: Nyoka Cowden, MD as PCP - General (Pulmonary Disease) Kimber Relic, MD as Consulting Physician (Internal Medicine) York Spaniel, MD as Consulting Physician (Neurology) Ernestene Mention, MD as Consulting Physician (General Surgery) Hilarie Fredrickson, MD as Consulting Physician (Gastroenterology) Vesta Mixer, MD as Consulting Physician (Cardiology)  Inpatient Treatment Team: Treatment Team: Attending Provider: Gavin Pound. Oletta Lamas, MD; Registered Nurse: Griffin Dakin, RN; Technician: Janit Pagan, NT; Consulting Physician: Bishop Limbo, MD  This patient is a 79 y.o.male who presents today for surgical evaluation at the request of Dr. Oletta Lamas, [facility] ED.   Reason for evaluation:  Fever, probable cholecystitis  A pleasant elderly gentleman.  History of a stroke with right upper extremity paresis/rigidity.  Mentally stable.  On chronic anticoagulation on warfarin with atrial fibrillation.    Patient developed decreased appetite over several weeks.  Then progressed to nausea and vomiting.  Had a high-grade fever.  Came to the emergency room in late July.  Elevated liver function tests and white count.  Common bile duct stones noted.  Suspicious for cholangitis.  Underwent ERCP.  Recommendation made for cholecystectomy.  Patient and family wished to wait until he was stronger and delay decision of elective lap cholecystectomy s until return to office visit.  It has been a month and patient has not come to the office.  He began to get decreased appetite again.  Spiking fevers.  Some abdominal discomfort.  He feels it is just like what happened a month ago.  Now living at [facility].  Brought in to the emergency department.  Workup shows new gallbladder wall thickening suspicious for cholecystitis on ultrasound.  Liver function tests still elevated but improved.  Urine negative.  Chest  x-ray negative.  Based on concerns, surgical consultation requested again.   Because of the patient's advanced age and next medical complexity, I requested internal medicine evaluation for probable mission and surgical consultation.  Past Medical History  Diagnosis Date  . GERD (gastroesophageal reflux disease)   . Hypertension   . Hearing loss     wears hearing aid - right side  . Leg swelling   . Trouble swallowing   . Bruises easily     due to coumadin  . Weakness     difficulty walking  . Contact lens/glasses fitting   . Stroke 1999  . Cancer   . 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 Gait disturbance   . Dyslipidemia   . COPD (chronic obstructive pulmonary disease)   . Peripheral vascular disease   . History of melanoma   . Diverticulosis   . Abnormality of gait 05/16/2013  . Choledocholithiasis with obstruction 06/28/13  . Embolic stroke   . PAF (paroxysmal atrial fibrillation)   . Hyperlipidemia   . Bradycardia   . Protein-calorie malnutrition, severe   . Ventricular tachycardia (paroxysmal)   . Obstructive sleep apnea of adult     uses cpap, pt does not know setting  . Personal history of fall 05/29/2013  . Fracture of femoral neck, right, closed 3114  . Long term (current) use of anticoagulants     PAF and embolic CVA  . Anemia, unspecified 07/05/2013  . Xerophthalmia 07/05/2013  . 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 Actinic keratosis   . Seborrheic keratosis     Past Surgical History  Procedure Laterality Date  . Excision  of melanoma  2009    left leg  . Melanoma excision      many melanoma removed in past  . Melanoma excision  10/11/2011    Procedure: MELANOMA EXCISION;  Surgeon: Valarie Merino, MD;  Location: [facility] OR;  Service: General;  Laterality: Left;  EXCISION melanoma left leg with full thickness skin grafting from left lower abdomen.  . Joint replacement  2012    r fem head fx  . Lung benign area removed   1970's  . Hernia repair  1983  . Melanoma excision   05/16/2012    Procedure: MELANOMA EXCISION;  Surgeon: Valarie Merino, MD;  Location: [facility] ORS;  Service: General;  Laterality: Left;  Nodule Removal of Melanoma on Left Shin  . Ercp N/A 07/04/2013    Procedure: ENDOSCOPIC RETROGRADE CHOLANGIOPANCREATOGRAPHY (ERCP);  Surgeon: Hilarie Fredrickson, MD;  Location: [facility];  Service: Endoscopy;  Laterality: N/A;    History   Social History  . Marital Status: Widowed    Spouse Name: N/A    Number of Children: 2  . Years of Education: MBA   Occupational History  . Retired    Social History Main Topics  . Smoking status: Former Smoker    Types: Pipe    Quit date: 08/31/1964  . Smokeless tobacco: Never Used  . Alcohol Use: No  . Drug Use: No  . Sexual Activity: No   Other Topics Concern  . Not on file   Social History Narrative  . No narrative on file    Family History  Problem Relation Age of Onset  . Heart disease Father 38  . Cancer Son     prostate  . Dementia Sister     One sister has dementia    Current Facility-Administered Medications  Medication Dose Route Frequency Provider Last Rate Last Dose  . piperacillin-tazobactam (ZOSYN) IVPB 3.375 g  3.375 g Intravenous Q8H Otho Bellows, RPH 12.5 mL/hr at 08/09/13 2136 3.375 g at 08/09/13 2136   Current Outpatient Prescriptions  Medication Sig Dispense Refill  . acetaminophen (TYLENOL) 325 MG tablet Take 650 mg by mouth every 6 (six) hours as needed for pain.       . furosemide (LASIX) 20 MG tablet 20 mg every other day.       . hydroxypropyl methylcellulose (ISOPTO TEARS) 2.5 % ophthalmic solution Place 1 drop into both eyes 4 (four) times daily as needed (eye irritation).       . meclizine (ANTIVERT) 25 MG tablet Take 25 mg by mouth 3 (three) times daily as needed for dizziness.       Marland Kitchen omeprazole (PRILOSEC) 20 MG capsule Take 20 mg by mouth daily.      . ranitidine (ZANTAC) 150 MG tablet Take 150 mg by mouth at bedtime.      Marland Kitchen warfarin (COUMADIN) 4 MG tablet Take 4 mg by  mouth daily.         Allergies  Allergen Reactions  . Sulfonamide Derivatives Rash    REACTION: rash    ROS: Constitutional:  +fevers, chills.  No sweats.  Weight stable Eyes:  No vision changes, No discharge HENT:  No sore throats, nasal drainage Lymph: No neck swelling, No bruising easily Pulmonary:  No cough, productive sputum CV: No orthopnea, PND  Patient walks little.  No exertional chest/neck/shoulder/arm pain. GI: No personal nor family history of GI/colon cancer, inflammatory bowel disease, irritable bowel syndrome, allergy such as Celiac Sprue, dietary/dairy problems, colitis, ulcers nor  gastritis.  No recent sick contacts/gastroenteritis.  No travel outside the country.  No changes in diet. Renal: No UTIs, No hematuria Genital:  No drainage, bleeding, masses Musculoskeletal: No severe joint pain.  Good ROM major joints Skin:  No sores or lesions.  No rashes Heme/Lymph:  No easy bleeding.  No swollen lymph nodes Neuro: No new focal weakness/numbness.  Still does not use right upper extremity much.  No seizures Psych: No suicidal ideation.  No hallucinations  BP 100/60  Pulse 93  Temp(Src) 99 F (37.2 C) (Oral)  Resp 21  SpO2 98%  Physical Exam: General: Pt awake/alert/oriented x4 in no major acute distress Eyes: PERRL, normal EOM. Sclera nonicteric Neuro: CN II-XII intact w/o focal sensory/motor deficits. Lymph: No head/neck/groin lymphadenopathy Psych:  No delerium/psychosis/paranoia.  Answers a little slowly but appropriately.  Mentally sharp.   HENT: Normocephalic, Mucus membranes moist.  No thrush Neck: Supple, No tracheal deviation Chest: No pain.  Good respiratory excursion. CV:  Pulses intact.  Borderline bradycardia.  Irregular rhythm Abdomen: Soft, Nondistended.  Nontender except mild RUQ/flank discomfort.  No incarcerated hernias.  Doughy.  No Murphy's sign.  No peritonitis Ext:  SCDs BLE.  No significant edema.  No cyanosis Skin: No petechiae /  purpurea.  No major sores Musculoskeletal: No severe joint pain.  Good ROM major joints   Results:   Labs: Results for orders placed during the hospital encounter of 08/09/13 (from the past 48 hour(s))  CBC WITH DIFFERENTIAL     Status: Abnormal   Collection Time    08/09/13  5:49 PM      Result Value Range   WBC 23.0 (*) 4.0 - 10.5 K/uL   RBC 4.37  4.22 - 5.81 MIL/uL   Hemoglobin 12.7 (*) 13.0 - 17.0 g/dL   HCT 16.1 (*) 09.6 - 04.5 %   MCV 88.3  78.0 - 100.0 fL   MCH 29.1  26.0 - 34.0 pg   MCHC 32.9  30.0 - 36.0 g/dL   RDW 40.9  81.1 - 91.4 %   Platelets 280  150 - 400 K/uL   Neutrophils Relative % 89 (*) 43 - 77 %   Neutro Abs 20.3 (*) 1.7 - 7.7 K/uL   Lymphocytes Relative 4 (*) 12 - 46 %   Lymphs Abs 0.9  0.7 - 4.0 K/uL   Monocytes Relative 8  3 - 12 %   Monocytes Absolute 1.8 (*) 0.1 - 1.0 K/uL   Eosinophils Relative 0  0 - 5 %   Eosinophils Absolute 0.0  0.0 - 0.7 K/uL   Basophils Relative 0  0 - 1 %   Basophils Absolute 0.0  0.0 - 0.1 K/uL  COMPREHENSIVE METABOLIC PANEL     Status: Abnormal   Collection Time    08/09/13  5:49 PM      Result Value Range   Sodium 128 (*) 135 - 145 mEq/L   Potassium 3.3 (*) 3.5 - 5.1 mEq/L   Chloride 90 (*) 96 - 112 mEq/L   CO2 25  19 - 32 mEq/L   Glucose, Bld 109 (*) 70 - 99 mg/dL   BUN 56 (*) 6 - 23 mg/dL   Creatinine, Ser 7.82 (*) 0.50 - 1.35 mg/dL   Calcium 8.5  8.4 - 95.6 mg/dL   Total Protein 6.6  6.0 - 8.3 g/dL   Albumin 2.5 (*) 3.5 - 5.2 g/dL   AST 28  0 - 37 U/L   ALT 22  0 - 53  U/L   Alkaline Phosphatase 259 (*) 39 - 117 U/L   Total Bilirubin 1.0  0.3 - 1.2 mg/dL   GFR calc non Af Amer 35 (*) >90 mL/min   GFR calc Af Amer 41 (*) >90 mL/min   Comment: (NOTE)     The eGFR has been calculated using the CKD EPI equation.     This calculation has not been validated in all clinical situations.     eGFR's persistently <90 mL/min signify possible Chronic Kidney     Disease.  LIPASE, BLOOD     Status: None   Collection  Time    08/09/13  6:05 PM      Result Value Range   Lipase 13  11 - 59 U/L  CG4 I-STAT (LACTIC ACID)     Status: None   Collection Time    08/09/13  6:52 PM      Result Value Range   Lactic Acid, Venous 1.27  0.5 - 2.2 mmol/L    Imaging / Studies: US Abdomen Complete  08/09/2013   *RADIOLOGY REPORT*  Clinical Data:  Nausea and fever.  COMPLETE ABDOMINAL ULTRASOUND  Comparison:  [place] 06/28/2013.  Findings:  Gallbladder:  New gallbladder wall thickening, 8 mm in maximal thickness, with mild striated appearance.  There is layering sludge and gallstones.  Noted gallstone in the neck of the gallbladder appears fixed. No sonographic Murphy's sign.  Common bile duct:  8 mm diameter.  There is also intrahepatic biliary ductal dilatation with multiple bright spectral reflectors that shadow.  This is consistent with pneumobilia in a patient status post sphincterotomy. Dilation is less than prior.  Twinkle artifact within the CBD, without bright reflector.  Liver:  No focal lesion identified.  Within normal limits in parenchymal echogenicity.  IVC:  Normal where seen.  Pancreas:  Normal where seen.  No pancreatic ductal dilatation.  Spleen:  Measures 10 cm.  Right Kidney:  Diffuse cortical thinning with a 10 cm length.  No hydronephrosis.  Left Kidney:  Diffuse cortical thinning within a 10 cm length.  No hydronephrosis.  Abdominal aorta:  No aneurysm where seen.  Maximal diameter proximally, 3.1 cm.  IMPRESSION: 1.  Cholelithiasis and new gallbladder wall thickening.  Although there is no sonographic Murphy's sign, findings could represent acute cholecystitis.  Consider biliary scintigraphy if there is clinical uncertainty. 2.  From July 2014, improved biliary ductal dilatation status post sphincterotomy and CBD stone extraction.  Pneumobilia decreases sensitivity for detecting current choledocholithiasis.   Original Report Authenticated By: Tiburcio Pea   Dg Chest Port 1 View  08/09/2013   *RADIOLOGY REPORT*   Clinical Data: Fever, weakness  PORTABLE CHEST - 1 VIEW  Comparison: Prior chest x-ray 07/04/2013  Findings: Stable cardiomegaly with left ventricular prominence. Atherosclerotic calcifications noted in the transverse aorta. Inspiratory volumes remain low.  There are bibasilar opacities which are similar compared to prior.  Surgical changes suggest prior thoracotomy with partial resection of the right fifth rib. No definite new airspace consolidation, pneumothorax or pulmonary edema.  Similar appearance of blunting of the right costophrenic angle which may reflect a small pleural effusion.  No acute osseous abnormality.  IMPRESSION:  Essentially stable chest x-ray without evidence of new acute cardiopulmonary process.  Stable pleural thickening versus small right pleural effusion.   Original Report Authenticated By: Malachy Moan, M.D.   *[place]* *[facility]* [address] [place], [place] [address] [phone]  ------------------------------------------------------------ Transthoracic Echocardiography  Patient: Chirag, Krueger MR #:  [id number] Study Date: 06/29/2013 Gender: M Age: 82 Height: 157.5cm Weight: 67.6kg BSA: 1.65m^2 Pt. Status: Room: 1424  PERFORMING [place], [facility] ATTENDING [place], [place] ORDERING [place], [place] REFERRING [place], [place] SONOGRAPHER [place], RDCS ADMITTING Mahala Menghini, Jai-Gurmukh cc:  ------------------------------------------------------------ LV EF: 55%  ------------------------------------------------------------ Indications: Atrial fibrillation - 427.31.  ------------------------------------------------------------ History: PMH: Atrial fibrillation. Stroke. Risk factors: Sinus bradycardia. Melanoma. PVD. GERD. Diverticulosis. Knee pain. Edema. Anemia. Former tobacco use. Hypertension. Dyslipidemia.  ------------------------------------------------------------ Study Conclusions  - Left ventricle: The cavity  size was normal. Wall thickness was normal. Basal inferior and basal posterior severe hypokinesis. The estimated ejection fraction was 55%. Doppler parameters are consistent with abnormal left ventricular relaxation (grade 1 diastolic dysfunction). - Aortic valve: There was mild stenosis. Mean gradient: 10mm Hg (S). Valve area: 1.89cm^2(VTI). Valve area: 1.68cm^2 (Vmax). - Mitral valve: Trivial regurgitation. - Left atrium: The atrium was mildly dilated. - Right ventricle: The cavity size was normal. Systolic function was normal. - Pulmonary arteries: No complete TR doppler jet so unable to estimate PA systolic pressure. - Systemic veins: IVC measured 2.1 cm with some respirophasic variation, suggesting RA pressure 10 mmHg. Impressions:  - Normal LV size and systolic function, EF 55%. Basal inferior and posterior severe hypokinesis. Normal RV size and systolic function. Mild aortic stenosis. Transthoracic echocardiography. M-mode, complete 2D, spectral Doppler, and color Doppler. Height: Height: 157.5cm. Height: 62in. Weight: Weight: 67.6kg. Weight: 148.7lb. Body mass index: BMI: 27.3kg/m^2. Body surface area: BSA: 1.44m^2. Blood pressure: 116/56. Patient status: Inpatient. Location: Bedside.  ------------------------------------------------------------  ------------------------------------------------------------ Left ventricle: The cavity size was normal. Wall thickness was normal. Basal inferior and basal posterior severe hypokinesis. The estimated ejection fraction was 55%. Doppler parameters are consistent with abnormal left ventricular relaxation (grade 1 diastolic dysfunction).  ------------------------------------------------------------ Aortic valve: Trileaflet; mildly calcified leaflets. Doppler: There was mild stenosis. No regurgitation. Valve area: 1.89cm^2(VTI). Indexed valve area: 1.12cm^2/m^2 (VTI). Peak velocity ratio of LVOT to aortic valve: 0.48. Valve  area: 1.68cm^2 (Vmax). Indexed valve area: 0.99cm^2/m^2 (Vmax). Mean gradient: 10mm Hg (S). Peak gradient: 16mm Hg (S).  ------------------------------------------------------------ Aorta: Aortic root: The aortic root was normal in size. Ascending aorta: The ascending aorta was normal in size.  ------------------------------------------------------------ Mitral valve: Doppler: There was no evidence for stenosis. Trivial regurgitation.  ------------------------------------------------------------ Left atrium: The atrium was mildly dilated.  ------------------------------------------------------------ Right ventricle: The cavity size was normal. Systolic function was normal.  ------------------------------------------------------------ Pulmonic valve: Structurally normal valve. Cusp separation was normal. Doppler: Transvalvular velocity was within the normal range. No regurgitation.  ------------------------------------------------------------ Tricuspid valve: Doppler: No significant regurgitation.  ------------------------------------------------------------ Pulmonary artery: No complete TR doppler jet so unable to estimate PA systolic pressure.  ------------------------------------------------------------ Right atrium: The atrium was normal in size.  ------------------------------------------------------------ Pericardium: There was no pericardial effusion.  ------------------------------------------------------------ Systemic veins: IVC measured 2.1 cm with some respirophasic variation, suggesting RA pressure 10 mmHg.  ------------------------------------------------------------  2D measurements Normal Doppler measurements Normal Left ventricle Left ventricle LVID ED, 45.3 mm 43-52 Ea, lat 5.5 cm/s ------ chord, ann, tiss PLAX DP LVID ES, 29.9 mm 23-38 E/Ea, lat 8.73 ------ chord, ann, tiss PLAX DP FS, chord, 34 % >29 Ea, med 5.11 cm/s ------ PLAX ann, tiss LVPW,  ED 11.3 mm ------ DP IVS/LVPW 1.02 <1.3 E/Ea, med 9.39 ------ ratio, ED ann, tiss Ventricular septum DP IVS, ED 11.5 mm ------ LVOT LVOT Peak vel, 97.8 cm/s ------ Diam, S 21 mm ------ S Area 3.46 cm^2 ------ Aortic valve Aorta Peak vel, 202 cm/s ------ Root diam, 32 mm ------ S ED  Mean vel, 153 cm/s ------ Left atrium S AP dim 34 mm ------ VTI, S 55.2 cm ------ AP dim 2.01 cm/m^2 <2.2 Mean 10 mm Hg ------ index gradient, Vol, S 70.3 ml ------ S Vol index, 41.6 ml/m^2 ------ Peak 16 mm Hg ------ S gradient, S Area, VTI 1.89 cm^2 ------ Area index 1.12 cm^2/m ------ (VTI) ^2 Peak vel 0.48 ------ ratio, LVOT/AV Area, Vmax 1.68 cm^2 ------ Area index 0.99 cm^2/m ------ (Vmax) ^2 Mitral valve Peak E vel 48 cm/s ------ Peak A vel 88.3 cm/s ------ Decelerati 306 ms 150-23 on time 0 Peak E/A 0.5 ------ ratio Systemic veins Estimated 15 mm Hg ------ CVP Right ventricle Sa vel, 13.2 cm/s ------ lat ann, tiss DP  ------------------------------------------------------------ Prepared and Electronically Authenticated by  Marca Ancona 2014-07-25T16:51:[id number]    Medications / Allergies: per chart  Antibiotics: Anti-infectives   Start     Dose/Rate Route Frequency Ordered Stop   08/09/13 2200  piperacillin-tazobactam (ZOSYN) IVPB 3.375 g     3.375 g 12.5 mL/hr over 240 Minutes Intravenous 3 times per day 08/09/13 2122        Assessment  [name] Panzer  79 y.o. male       Problem List:  Principal Problem:   Acute cholecystitis Active Problems:   HYPERTENSION   PVD   Long term current use of anticoagulant   Cerebrovascular disease, unspecified   Ventricular tachycardia, non-sustained   Atrial fibrillation   Choledocholithiasis with obstruction s/p ERCP [id number]   DNR (do not resuscitate)   Obstructive sleep apnea   Recurrent anorexia, nausea, abdominal discomfort, high fevers.  Ultrasound suspicious for cholecystitis.  Plan: (Discussed with Dr.  [name] with [facility] hospitalists/internal medicine)  Admit.  Agree with IV antibiotics.  Use in units and probably okay.  Okay to use the Zosyn for now.  Hold anticoagulation.  Complete fever workup.  At some point, he will require cholecystectomy for presumed cholecystitis.    If he rapidly declines, percutaneous drainage and elected cholecystectomy six weeks later.    If stabilizes, proceed with laparoscopic approach.  Increased risk of conversion to open and other complications given an emergent/inflamed setting, but he has failed nonoperative management.  He is interested in proceeding with surgery this admission:  The anatomy & physiology of hepatobiliary & pancreatic function was discussed.  The pathophysiology of gallbladder dysfunction was discussed.  Natural history risks without surgery was discussed.   I feel the risks of no intervention will lead to serious problems that outweigh the operative risks; therefore, I recommended cholecystectomy to remove the pathology.  I explained laparoscopic techniques with possible need for an open approach.  Probable cholangiogram to evaluate the bilary tract was explained as well.    Risks such as bleeding, infection, abscess, leak, injury to other organs, need for further treatment, heart attack, death, and other risks were discussed.  I noted a good likelihood this will help address the problem.  Possibility that this will not correct all abdominal symptoms was explained.  Goals of post-operative recovery were discussed as well.  We will work to minimize complications.  An educational handout further explaining the pathology and treatment options will be given as well.  Questions were answered.  The patient expresses understanding & wishes to proceed with surgery. Once cleared by medicine, renal failure resolved, and INR <1.7, proceed with surgery.  -VTE prophylaxis- SCDs, etc  -mobilize as tolerated to help recovery    Ardeth Sportsman,  M.D., F.A.C.S. Gastrointestinal and Minimally Invasive  [facility] [address], [address] [place], [place] [address] [phone] Main / Paging   08/09/2013

## 2013-08-09 NOTE — Assessment & Plan Note (Signed)
Hx. Right sided weakness even if grip strength is 5/5, contracted the right elbow/wrist/hand.

## 2013-08-09 NOTE — Assessment & Plan Note (Signed)
Takes Omeprazole 20mg  and Ranitidine 150mg 

## 2013-08-09 NOTE — Assessment & Plan Note (Signed)
Rate controlled without rhythm agent. Chronic anticoagulation therapy with Coumadin for risk reduction of thromboembolic event       

## 2013-08-09 NOTE — ED Notes (Signed)
Per ems for family pt mental status has been altered over last 3 days with fever temp 102 oral.This nurse got temp of 101.3 orally,pt is ox3  And pt is from [facility]

## 2013-08-09 NOTE — Assessment & Plan Note (Signed)
Controlled on Lasix. 20mg   

## 2013-08-09 NOTE — Progress Notes (Signed)
CSW met with pt at bedside.  Pt confirmed that he is a resident of [facility] [address].  Pt could not confirm that his son, [name],  was his HCPOA, but reported that his son made all of the medical decisions for him.    [name], LCSWA  [phone]  .08/09/2013  7:30 pm

## 2013-08-09 NOTE — Assessment & Plan Note (Signed)
Confused and wander off--UA pending. Hx of cholelithiasis--with similar presentation-fever, poor appetite, confusion--ER evaluation.

## 2013-08-10 ENCOUNTER — Encounter (HOSPITAL_COMMUNITY): Payer: Self-pay

## 2013-08-10 DIAGNOSIS — J9600 Acute respiratory failure, unspecified whether with hypoxia or hypercapnia: Secondary | ICD-10-CM

## 2013-08-10 DIAGNOSIS — Z01810 Encounter for preprocedural cardiovascular examination: Secondary | ICD-10-CM

## 2013-08-10 DIAGNOSIS — K810 Acute cholecystitis: Secondary | ICD-10-CM

## 2013-08-10 DIAGNOSIS — E876 Hypokalemia: Secondary | ICD-10-CM | POA: Diagnosis present

## 2013-08-10 DIAGNOSIS — J9601 Acute respiratory failure with hypoxia: Secondary | ICD-10-CM | POA: Diagnosis present

## 2013-08-10 DIAGNOSIS — N179 Acute kidney failure, unspecified: Secondary | ICD-10-CM | POA: Diagnosis present

## 2013-08-10 DIAGNOSIS — D72829 Elevated white blood cell count, unspecified: Secondary | ICD-10-CM | POA: Diagnosis present

## 2013-08-10 DIAGNOSIS — D638 Anemia in other chronic diseases classified elsewhere: Secondary | ICD-10-CM | POA: Diagnosis present

## 2013-08-10 DIAGNOSIS — E871 Hypo-osmolality and hyponatremia: Secondary | ICD-10-CM | POA: Diagnosis present

## 2013-08-10 LAB — COMPREHENSIVE METABOLIC PANEL
ALT: 15 U/L (ref 0–53)
AST: 15 U/L (ref 0–37)
Alkaline Phosphatase: 194 U/L — ABNORMAL HIGH (ref 39–117)
CO2: 24 mEq/L (ref 19–32)
Calcium: 7.8 mg/dL — ABNORMAL LOW (ref 8.4–10.5)
GFR calc Af Amer: 53 mL/min — ABNORMAL LOW (ref >90)
GFR calc non Af Amer: 46 mL/min — ABNORMAL LOW (ref >90)
Glucose, Bld: 91 mg/dL (ref 70–99)
Potassium: 3 mEq/L — ABNORMAL LOW (ref 3.5–5.1)
Sodium: 133 mEq/L — ABNORMAL LOW (ref 135–145)

## 2013-08-10 LAB — URINALYSIS, ROUTINE W REFLEX MICROSCOPIC
Nitrite: NEGATIVE
Specific Gravity, Urine: 1.021 (ref 1.005–1.030)
pH: 5.5 (ref 5.0–8.0)

## 2013-08-10 LAB — CBC
HCT: 32.9 % — ABNORMAL LOW (ref 39.0–52.0)
MCHC: 32.8 g/dL (ref 30.0–36.0)
RBC: 3.69 MIL/uL — ABNORMAL LOW (ref 4.22–5.81)
WBC: 19.3 10*3/uL — ABNORMAL HIGH (ref 4.0–10.5)

## 2013-08-10 LAB — URINE MICROSCOPIC-ADD ON

## 2013-08-10 MED ORDER — ONDANSETRON HCL 4 MG/2ML IJ SOLN: 4.0000 mg | Freq: Four times a day (QID) | INTRAMUSCULAR | Status: DC | PRN

## 2013-08-10 MED ORDER — ONDANSETRON HCL 4 MG PO TABS: 4.0000 mg | ORAL_TABLET | Freq: Four times a day (QID) | ORAL | Status: DC | PRN

## 2013-08-10 MED ORDER — POLYVINYL ALCOHOL 1.4 % OP SOLN
1.0000 [drp] | Freq: Four times a day (QID) | OPHTHALMIC | Status: DC | PRN
  Filled 2013-08-10: qty 15

## 2013-08-10 MED ORDER — ONDANSETRON 8 MG/NS 50 ML IVPB
8.0000 mg | Freq: Four times a day (QID) | INTRAVENOUS | Status: DC | PRN
  Filled 2013-08-10: qty 8

## 2013-08-10 MED ORDER — VITAMIN K1 10 MG/ML IJ SOLN
5.0000 mg | Freq: Once | INTRAVENOUS | Status: AC
  Administered 2013-08-10: 5 mg via INTRAVENOUS
  Filled 2013-08-10: qty 0.5

## 2013-08-10 MED ORDER — HYDROMORPHONE HCL PF 1 MG/ML IJ SOLN: 0.5000 mg | INTRAMUSCULAR | Status: DC | PRN

## 2013-08-10 MED ORDER — DEXTROSE-NACL 5-0.9 % IV SOLN
INTRAVENOUS | Status: DC
  Administered 2013-08-10 – 2013-08-12 (×5): via INTRAVENOUS

## 2013-08-10 MED ORDER — SODIUM CHLORIDE 0.9 % IJ SOLN
3.0000 mL | Freq: Two times a day (BID) | INTRAMUSCULAR | Status: DC
  Administered 2013-08-10 – 2013-08-12 (×2): 3 mL via INTRAVENOUS

## 2013-08-10 MED ORDER — POTASSIUM CHLORIDE CRYS ER 20 MEQ PO TBCR
40.0000 meq | EXTENDED_RELEASE_TABLET | Freq: Two times a day (BID) | ORAL | Status: AC
  Administered 2013-08-10 (×2): 40 meq via ORAL
  Filled 2013-08-10 (×2): qty 2

## 2013-08-10 MED ORDER — HEPARIN SODIUM (PORCINE) 5000 UNIT/ML IJ SOLN
5000.0000 [IU] | Freq: Three times a day (TID) | INTRAMUSCULAR | Status: DC
  Administered 2013-08-10: 5000 [IU] via SUBCUTANEOUS
  Filled 2013-08-10 (×5): qty 1

## 2013-08-10 MED ORDER — PANTOPRAZOLE SODIUM 40 MG PO TBEC
40.0000 mg | DELAYED_RELEASE_TABLET | Freq: Every day | ORAL | Status: DC
  Administered 2013-08-10 – 2013-08-13 (×4): 40 mg via ORAL
  Filled 2013-08-10 (×4): qty 1

## 2013-08-10 NOTE — Progress Notes (Signed)
RT just made aware of new cpap rx.  Pt seen, resting comfortably on room air.  HR79, sats93%.  Pt was offered cpap, but he refused and said that he doesn't want it now.  Pt stated he'll wear it tonight.  RN notified.

## 2013-08-10 NOTE — Progress Notes (Signed)
Subjective: He is very hard of hearing and hearing aid is out.  He denies any pain.  Say he was "losing some of his facilities, and running a fever," so they brought him back to the hospital.  He says that if he needs his GB out he would want that.  Objective: Vital signs in last 24 hours: Temp:  [97.5 F (36.4 C)-101.2 F (38.4 C)] 98.2 F (36.8 C) (09/05 0455) Pulse Rate:  [63-93] 70 (09/05 0455) Resp:  [16-21] 18 (09/05 0455) BP: (100-120)/(47-67) 114/53 mmHg (09/05 0455) SpO2:  [85 %-98 %] 98 % (09/05 0455) Weight:  [68.4 kg (150 lb 12.7 oz)] 68.4 kg (150 lb 12.7 oz) (09/05 0058)  TM 101.2 at 1700 last PM, down since then, VSS Labs for this Am pending, blood and urine cultures pending Abd [place] yesterday:  1. Cholelithiasis and new gallbladder wall thickening. Although there is no sonographic Murphy's sign, findings could represent acute cholecystitis. Consider biliary scintigraphy if there is clinical uncertainty. 2. From July 2014, improved biliary ductal dilatation status post sphincterotomy and CBD stone extraction. Pneumobilia decreases sensitivity for detecting current choledocholithiasis.   Intake/Output from previous day: 09/04 0701 - 09/05 0700 In: 391.3 [I.V.:291.3; IV Piggyback:100] Out: 300 [Urine:300] Intake/Output this shift:    General appearance: alert, cooperative, no distress. GI: soft, non-tender; bowel sounds normal; no masses,  no organomegaly  Lab Results:   Recent Labs  08/09/13 1749  WBC 23.0*  HGB 12.7*  HCT 38.6*  PLT 280    BMET  Recent Labs  08/09/13 1749  NA 128*  K 3.3*  CL 90*  CO2 25  GLUCOSE 109*  BUN 56*  CREATININE 1.64*  CALCIUM 8.5   PT/INR  Recent Labs  08/09/13 2305  LABPROT 23.4*  INR 2.16*     Recent Labs Lab 08/09/13 1749  AST 28  ALT 22  ALKPHOS 259*  BILITOT 1.0  PROT 6.6  ALBUMIN 2.5*     Lipase     Component Value Date/Time   LIPASE 13 08/09/2013 1805     Studies/Results: US  Abdomen Complete  08/09/2013   *RADIOLOGY REPORT*  Clinical Data:  Nausea and fever.  COMPLETE ABDOMINAL ULTRASOUND  Comparison:  [place] 06/28/2013.  Findings:  Gallbladder:  New gallbladder wall thickening, 8 mm in maximal thickness, with mild striated appearance.  There is layering sludge and gallstones.  Noted gallstone in the neck of the gallbladder appears fixed. No sonographic Murphy's sign.  Common bile duct:  8 mm diameter.  There is also intrahepatic biliary ductal dilatation with multiple bright spectral reflectors that shadow.  This is consistent with pneumobilia in a patient status post sphincterotomy. Dilation is less than prior.  Twinkle artifact within the CBD, without bright reflector.  Liver:  No focal lesion identified.  Within normal limits in parenchymal echogenicity.  IVC:  Normal where seen.  Pancreas:  Normal where seen.  No pancreatic ductal dilatation.  Spleen:  Measures 10 cm.  Right Kidney:  Diffuse cortical thinning with a 10 cm length.  No hydronephrosis.  Left Kidney:  Diffuse cortical thinning within a 10 cm length.  No hydronephrosis.  Abdominal aorta:  No aneurysm where seen.  Maximal diameter proximally, 3.1 cm.  IMPRESSION: 1.  Cholelithiasis and new gallbladder wall thickening.  Although there is no sonographic Murphy's sign, findings could represent acute cholecystitis.  Consider biliary scintigraphy if there is clinical uncertainty. 2.  From July 2014, improved biliary ductal dilatation status post sphincterotomy and CBD stone extraction.  Pneumobilia decreases sensitivity for detecting current choledocholithiasis.   Original Report Authenticated By: Tiburcio Pea   Dg Chest Port 1 View  08/09/2013   *RADIOLOGY REPORT*  Clinical Data: Fever, weakness  PORTABLE CHEST - 1 VIEW  Comparison: Prior chest x-ray 07/04/2013  Findings: Stable cardiomegaly with left ventricular prominence. Atherosclerotic calcifications noted in the transverse aorta. Inspiratory volumes remain low.  There  are bibasilar opacities which are similar compared to prior.  Surgical changes suggest prior thoracotomy with partial resection of the right fifth rib. No definite new airspace consolidation, pneumothorax or pulmonary edema.  Similar appearance of blunting of the right costophrenic angle which may reflect a small pleural effusion.  No acute osseous abnormality.  IMPRESSION:  Essentially stable chest x-ray without evidence of new acute cardiopulmonary process.  Stable pleural thickening versus small right pleural effusion.   Original Report Authenticated By: Malachy Moan, M.D.    Medications: . famotidine  20 mg Oral Daily  . heparin  5,000 Units Subcutaneous Q8H  . lip balm  1 application Topical BID  . pantoprazole  40 mg Oral Daily  . piperacillin-tazobactam (ZOSYN)  IV  3.375 g Intravenous Q8H  . polyethylene glycol  17 g Oral BID  . sodium chloride  500 mL Intravenous Once  . sodium chloride  3 mL Intravenous Q12H    Assessment/Plan Fever, probable cholecystitis S/p ERCP 07/04/13,for choledocholithiasis, Pt and family opted not to have surgery in July. Possible UTI Hyponatremia/hypokalemia Hx of non sustainedVentricular tachycardia/PAF Hx of CVA on chronic coumadin (INR 2.17 last PM on admit.) Hypertension OSA  DNR   Plan:  Await labs and cultures, he has had two doses of Zosyn since admit, and is afebrile.  Will follow with you.  He is still NPO, I doubt he will have surgery today.  We would need to decide on drain vs surgery if we think this is from his gallbladder.      LOS: 1 day    Randall Rampersad 08/10/2013

## 2013-08-10 NOTE — Progress Notes (Deleted)
[facility]  [facility] is providing the following services: RW   If patient discharges after hours, please call [phone].   Renard Hamper 08/10/2013, 9:06 AM

## 2013-08-10 NOTE — Consult Note (Signed)
CARDIOLOGY CONSULT NOTE   Patient ID: [name] MRN: [id number] DOB/AGE: 1919-11-05 79 y.o.  Admit date: 08/09/2013  Primary Physician   [name], MD Primary Cardiologist   [facility], saw in hospital July 2014 for [facility] Reason for Consultation   Pre-op eval  JYN:[id number] [name] is a 79 y.o. male with no history of CAD. He has been on coumadin for a CVA since 1999. He is followed in the coumadin clinic, but not by cardiology. He is followed closely by Dr. [name]. He was admitted and diagnosed with probable cholecystitis, pre-op evaluation by cardiology requested.   [name] was initially admitted with confusion and fever. His fever has improved after Zosyn x 2 doses. Surgery is seeing him for consideration of lap choley vs perc drain, but the surgeon is favoring a percutaneous drain with elective cholecystectomy after completing antibiotic therapy.  [name] never gets chest pain. He is not very active at baseline, but is able to use a motorized chair and get around with some independence. His activity level has been lower than usual recently because of knee problems and leg weakness. He has fallen recently, the last time a few weeks ago. He will get SOB with exertion during exercises but this has not changed recently.      Past Medical History  Diagnosis Date  . GERD (gastroesophageal reflux disease)   . Hypertension   . Hearing loss     wears hearing aid - right side  . Leg swelling   . Trouble swallowing   . Bruises easily     due to coumadin  . Weakness     difficulty walking  . Contact lens/glasses fitting   . Stroke 1999  . Cancer   . 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 Gait disturbance   . Dyslipidemia   . COPD (chronic obstructive pulmonary disease)   . Peripheral vascular disease   . History of melanoma   . Diverticulosis   . Abnormality of gait 05/16/2013  . Choledocholithiasis with obstruction 06/28/13  . Embolic stroke   . PAF (paroxysmal atrial fibrillation)   .  Hyperlipidemia   . Bradycardia   . Protein-calorie malnutrition, severe   . Ventricular tachycardia (paroxysmal)   . Obstructive sleep apnea of adult     uses cpap, pt does not know setting  . Personal history of fall 05/29/2013  . Fracture of femoral neck, right, closed 3114  . Long term (current) use of anticoagulants     PAF and embolic CVA  . Anemia, unspecified 07/05/2013  . Xerophthalmia 07/05/2013  . 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 Actinic keratosis   . Seborrheic keratosis      Past Surgical History  Procedure Laterality Date  . Excision of melanoma  2009    left leg  . Melanoma excision      many melanoma removed in past  . Melanoma excision  10/11/2011    Procedure: MELANOMA EXCISION;  Surgeon: Valarie Merino, MD;  Location: [facility] OR;  Service: General;  Laterality: Left;  EXCISION melanoma left leg with full thickness skin grafting from left lower abdomen.  . Joint replacement  2012    r fem head fx  . Lung benign area removed   1970's  . Hernia repair  1983  . Melanoma excision  05/16/2012    Procedure: MELANOMA EXCISION;  Surgeon: Valarie Merino, MD;  Location: [facility] ORS;  Service: General;  Laterality: Left;  Nodule Removal of Melanoma on  Left Shin  . Ercp N/A 07/04/2013    Procedure: ENDOSCOPIC RETROGRADE CHOLANGIOPANCREATOGRAPHY (ERCP);  Surgeon: Hilarie Fredrickson, MD;  Location: [facility];  Service: Endoscopy;  Laterality: N/A;    Allergies  Allergen Reactions  . Sulfonamide Derivatives Rash    REACTION: rash    I have reviewed the patient's current medications . famotidine  20 mg Oral Daily  . heparin  5,000 Units Subcutaneous Q8H  . lip balm  1 application Topical BID  . pantoprazole  40 mg Oral Daily  . piperacillin-tazobactam (ZOSYN)  IV  3.375 g Intravenous Q8H  . polyethylene glycol  17 g Oral BID  . sodium chloride  500 mL Intravenous Once  . sodium chloride  3 mL Intravenous Q12H   . dextrose 5 % and 0.9% NaCl 75 mL/hr at 08/10/13 0207   acetaminophen,  acetaminophen, alum & mag hydroxide-simeth, bisacodyl, diphenhydrAMINE, fentaNYL, HYDROmorphone (DILAUDID) injection, lactated ringers, magic mouthwash, magnesium hydroxide, meclizine, metoprolol, ondansetron (ZOFRAN) IV, ondansetron (ZOFRAN) IV, ondansetron, ondansetron, polyvinyl alcohol, promethazine, traMADol  Prior to Admission medications   Medication Sig Start Date End Date Taking? Authorizing Provider  acetaminophen (TYLENOL) 325 MG tablet Take 650 mg by mouth every 6 (six) hours as needed for pain.    Yes Historical Provider, MD  furosemide (LASIX) 20 MG tablet 20 mg every other day.  03/18/13  Yes Nyoka Cowden, MD  hydroxypropyl methylcellulose (ISOPTO TEARS) 2.5 % ophthalmic solution Place 1 drop into both eyes 4 ([name]) times daily as needed (eye irritation).    Yes Historical Provider, MD  meclizine (ANTIVERT) 25 MG tablet Take 25 mg by mouth 3 (three) times daily as needed for dizziness.    Yes Historical Provider, MD  omeprazole (PRILOSEC) 20 MG capsule Take 20 mg by mouth daily.   Yes Historical Provider, MD  ranitidine (ZANTAC) 150 MG tablet Take 150 mg by mouth at bedtime.   Yes Historical Provider, MD  warfarin (COUMADIN) 4 MG tablet Take 4 mg by mouth daily.   Yes Historical Provider, MD     History   Social History  . Marital Status: Widowed    Spouse Name: N/A    Number of Children: 2  . Years of Education: MBA   Occupational History  . Retired    Social History Main Topics  . Smoking status: Former Smoker    Types: Pipe    Quit date: 08/31/1964  . Smokeless tobacco: Never Used  . Alcohol Use: No  . Drug Use: No  . Sexual Activity: No   Other Topics Concern  . Not on file   Social History Narrative  . Lives in [facility]    Family Status  Relation Status Death Age  . Father Deceased   . Son Alive   . Mother Deceased     Old age  . Sister Alive    Family History  Problem Relation Age of Onset  . Heart disease Father 45  . Cancer Son      prostate  . Dementia Sister     One sister has dementia     ROS:  Full 14 point review of systems complete and found to be negative unless listed above.  Physical Exam: Blood pressure 114/53, pulse 70, temperature 98.2 F (36.8 C), temperature source Oral, resp. rate 18, height 5\' 3"  (1.6 m), weight 150 lb 12.7 oz (68.4 kg), SpO2 98.00%.  General: Well developed, well nourished, elderly male in no acute distress Head: Eyes PERRLA, No xanthomas.  Normocephalic and atraumatic, oropharynx without edema or exudate. Dentition: poor Lungs: clear anteriorly Heart:Heart irregular rate and rhythm with S1, S2; no murmur. pulses are 2+ all 4 extrem.   Neck: No carotid bruits. No lymphadenopathy.  JVD not elevated. Abdomen: Bowel sounds present, abdomen soft and tender without masses or hernias noted. Msk:  No spine or cva tenderness. No weakness, no joint deformities or effusions. Extremities: No clubbing or cyanosis. No edema.  Neuro: Alert and oriented X 3. No focal deficits noted. Very HOH Psych:  Good affect, responds appropriately Skin: No rashes or lesions noted.  Labs:  Lab Results  Component Value Date   WBC 19.3* 08/10/2013   HGB 10.8* 08/10/2013   HCT 32.9* 08/10/2013   MCV 89.2 08/10/2013   PLT 264 08/10/2013    Recent Labs  08/10/13 0805  INR 2.12*     Recent Labs Lab 08/10/13 0805  NA 133*  K 3.0*  CL 99  CO2 24  BUN 43*  CREATININE 1.31  CALCIUM 7.8*  PROT 5.2*  BILITOT 0.8  ALKPHOS 194*  ALT 15  AST 15  GLUCOSE 91   Albumin  Date Value Range Status  08/10/2013 1.8* 3.5 - 5.2 g/dL Final   Lipase  Date/Time Value Range Status  08/09/2013  6:05 PM 13  11 - 59 U/L Final   Echo: 06/29/2013 Study Conclusions - Left ventricle: The cavity size was normal. Wall thickness was normal. Basal inferior and basal posterior severe hypokinesis. The estimated ejection fraction was 55%. Doppler parameters are consistent with abnormal left ventricular relaxation (grade 1  diastolic dysfunction). - Aortic valve: There was mild stenosis. Mean gradient: 10mm Hg (S). Valve area: 1.89cm^2(VTI). Valve area: 1.68cm^2 (Vmax). - Mitral valve: Trivial regurgitation. - Left atrium: The atrium was mildly dilated. - Right ventricle: The cavity size was normal. Systolic function was normal. - Pulmonary arteries: No complete TR doppler jet so unable to estimate PA systolic pressure. - Systemic veins: IVC measured 2.1 cm with some respirophasic variation, suggesting RA pressure 10 mmHg. Impressions: - Normal LV size and systolic function, EF 55%. Basal inferior and posterior severe hypokinesis. Normal RV size and systolic function. Mild aortic stenosis.  ECG:  Atrial fibrillation, rate controlled, no acute ischemic changes.  Radiology:  US Abdomen Complete 08/09/2013   *RADIOLOGY REPORT*  Clinical Data:  Nausea and fever.  COMPLETE ABDOMINAL ULTRASOUND  Comparison:  [place] 06/28/2013.  Findings:  Gallbladder:  New gallbladder wall thickening, 8 mm in maximal thickness, with mild striated appearance.  There is layering sludge and gallstones.  Noted gallstone in the neck of the gallbladder appears fixed. No sonographic Murphy's sign.  Common bile duct:  8 mm diameter.  There is also intrahepatic biliary ductal dilatation with multiple bright spectral reflectors that shadow.  This is consistent with pneumobilia in a patient status post sphincterotomy. Dilation is less than prior.  Twinkle artifact within the CBD, without bright reflector.  Liver:  No focal lesion identified.  Within normal limits in parenchymal echogenicity.  IVC:  Normal where seen.  Pancreas:  Normal where seen.  No pancreatic ductal dilatation.  Spleen:  Measures 10 cm.  Right Kidney:  Diffuse cortical thinning with a 10 cm length.  No hydronephrosis.  Left Kidney:  Diffuse cortical thinning within a 10 cm length.  No hydronephrosis.  Abdominal aorta:  No aneurysm where seen.  Maximal diameter proximally, 3.1 cm.   IMPRESSION: 1.  Cholelithiasis and new gallbladder wall thickening.  Although there is no sonographic Murphy's  sign, findings could represent acute cholecystitis.  Consider biliary scintigraphy if there is clinical uncertainty. 2.  From July 2014, improved biliary ductal dilatation status post sphincterotomy and CBD stone extraction.  Pneumobilia decreases sensitivity for detecting current choledocholithiasis.   Original Report Authenticated By: Tiburcio Pea   Dg Chest Port 1 View 08/09/2013   *RADIOLOGY REPORT*  Clinical Data: Fever, weakness  PORTABLE CHEST - 1 VIEW  Comparison: Prior chest x-ray 07/04/2013  Findings: Stable cardiomegaly with left ventricular prominence. Atherosclerotic calcifications noted in the transverse aorta. Inspiratory volumes remain low.  There are bibasilar opacities which are similar compared to prior.  Surgical changes suggest prior thoracotomy with partial resection of the right fifth rib. No definite new airspace consolidation, pneumothorax or pulmonary edema.  Similar appearance of blunting of the right costophrenic angle which may reflect a small pleural effusion.  No acute osseous abnormality.  IMPRESSION:  Essentially stable chest x-ray without evidence of new acute cardiopulmonary process.  Stable pleural thickening versus small right pleural effusion.   Original Report Authenticated By: Malachy Moan, M.D.    ASSESSMENT AND PLAN:   The patient was seen today by Dr. Wyline Mood, the patient evaluated and the data reviewed.   Pre-op eval:  Dr. Jenene Slicker recommendations from July 2014 are reproduced below and were reviewed.   Mr. Heslin has no ongoing ischemic symptoms. He had WMA on a recent echo, but his EF is preserved. He is at increased but not prohibitive risk for the procedures planned. We will follow him while hospitalized. He should remain on telemetry, both for his permanent atrial fibrillation and his history of WCT (asymptomatic 7 beat run during prior  admission). He is bradycardic at times and no BB will be added. He has fallen recently and is very weak at baseline. Coumadin will be held for the procedures, MD advise if cross-coverage with lovenox or IV heparin needed since he has a history of CVA and an elevated CHADs score.  Dr. Antoine Poche saw in consult in July 2014 for WCT.  Per his note:  ASSESSMENT AND PLAN:  WIDE COMPLEX ARRHYTHMIA: Probable VTach. However, I would treat this conservatively. He has bradycardia on telemetry. I would not suggest adding beta blocker or calcium channel blocker.  ATRIAL FIB: Given the previous stroke he would be high risk for future embolic events. I agree with restarting anticoagulation when OK with GI.  BRADYCARDIA: As above  ABNORMAL ECHO: Given the absence of symptoms and overall frailty I would not suggest further evaluation for possible CAD.   Otherwise, per IM, CCS. Principal Problem:   Acute cholecystitis Active Problems:   HYPERTENSION   PVD   GERD   CVA (cerebral vascular accident)   Long term current use of anticoagulant   Protein-calorie malnutrition, severe   Atrial fibrillation   Obstructive sleep apnea   Acute respiratory failure with hypoxia   Hypokalemia   Leukocytosis, unspecified   Anemia of chronic disease   Hyponatremia   Acute renal failure   Signed: Theodore Demark, PA-C 08/10/2013 10:24 AM Beeper [phone]  Co-Sign MD Patient seen and discussed with NP Barrett, I agree with her history, exam, and assessment and plan. Mr. Vidales is a 79 yo male w/ multiple comorbidities including parox afib on antiocoagulation, HTN, OSA, and sinus bradycardia admitted for cholecystitis, cardiology has been consulted for pre-op evaluation. Prior echo show a normal LVEF, w/ baseline inferior wall hypokinesis, grade I diastolic dysfunction, and mild aortic stenosis. EKG shows SR w/ old inferior Q-waves, no acute  ischemic changes. The patient is limited physically due to prior stroke and  deconditioning. He denies any chest pain, SOB, or palpitations. He has no active acute cardiac issues. He is being considered for an intermediate risk procedure. He is at increased risk for events due to his multiple medical comorbidities and age, however further cardiac testing would not effect management strategy. Recommend proceed w/ either biliary drain placement or cholecystectomy as planned. Will not start beta blocker this close periop due to his history of sinus bradycardia. He is at high risk for both CVA given his afib history and CHADS2 score of 4 and bleeding given his deconditioning and mechanical limitations, I would recommend continuing anticoagulation. Please bridge w/ IV herapin as needed.   Dina Rich MD [facility] Beeper [phone]

## 2013-08-10 NOTE — Progress Notes (Signed)
Clinical Social Work Department BRIEF PSYCHOSOCIAL ASSESSMENT 08/10/2013  Patient:  Lepera,Brooklyn M     Account Number:  [id number]     Admit date:  08/09/2013  Clinical Social Worker:  Orpah Greek  Date/Time:  08/10/2013 12:30 PM  Referred by:  Physician  Date Referred:  08/10/2013 Referred for  Other - See comment   Other Referral:   Admitted from: [facility]   Interview type:  Patient Other interview type:    PSYCHOSOCIAL DATA Living Status:  FACILITY Admitted from facility:  [facility] Level of care:  Independent Living Primary support name:  Vickey Ewbank (son) ph#: [phone]  [phone] Primary support relationship to patient:  CHILD, ADULT Degree of support available:   good    CURRENT CONCERNS Current Concerns  Post-Acute Placement   Other Concerns:    SOCIAL WORK ASSESSMENT / PLAN CSW received consult that patient was admitted from [facility]. Patient was recently at [facility] 7/24-7/31 and went back to [facility] but stayed for a few days in their skilled nursing facility.   Assessment/plan status:  Information/Referral to [facility] Other assessment/ plan:   Information/referral to community resources:   CSW completed FL2 and faxed information to [facility] - spoke with Lilly Cove @ [facility], awaiting PT evaluation/disposition recommendation.    PATIENT'S/FAMILY'S RESPONSE TO PLAN OF CARE: Patient states that he would prefer to go back to his Independent Living apartment, but would wait and see what PT recommends.       Unice Bailey, LCSW [facility] Clinical Social Worker cell #: [phone]

## 2013-08-10 NOTE — Progress Notes (Signed)
INITIAL NUTRITION ASSESSMENT  DOCUMENTATION CODES Per approved criteria  -Not Applicable   INTERVENTION: Diet advancement per MD discretion Add Ensure Complete BID when diet advanced RD to follow-up to provide low fat diet education  NUTRITION DIAGNOSIS: Inadequate oral intake related to poor appetite as evidenced by family report of pt not eating much the past few days and current NPO status.   Goal: Pt to meet >/= 90% of their estimated nutrition needs   Monitor:  Diet advancement PO intake Weight Labs   Reason for Assessment: Consult  79 y.o. male  Admitting Dx: Acute cholecystitis  ASSESSMENT: 79 y.o. male with multiple medical problems including Vtach, afib on coumadin, prior CVA, dysphagia, HTN, melanoma, Sleep apnea on CPAP, hx of sinus bradycardia excluding the use of chronic neg chronotrophs, admitted recently for choledocholithiasis and cholecystitis, s/p ERCP with splinterotomy and stone removal, brought in from ALF with confusion and fever. He also complained of nausea, vomited once, and having no appetite. Pt asleep at time of visit. Per family pt has a good appetite and was eating well up until a few days ago. Family reports pt has not been eating much for the past few days. Family also reports that pt eats a lot of high fat foods. RD to follow-up when pt awake and diet advanced to provide low fat education to help prevent future cholecystitis. Pt had lost 11 lbs back in July but, for the past 2 months pt has been maintaining weight at approximately 150 lbs.   Height: Ht Readings from Last 1 Encounters:  08/10/13 5\' 3"  (1.6 m)    Weight: Wt Readings from Last 1 Encounters:  08/10/13 150 lb 12.7 oz (68.4 kg)    Ideal Body Weight: 124 lbs  % Ideal Body Weight: 120%  Wt Readings from Last 10 Encounters:  08/10/13 150 lb 12.7 oz (68.4 kg)  07/24/13 149 lb 12.8 oz (67.949 kg)  06/29/13 149 lb 0.5 oz (67.6 kg)  06/29/13 149 lb 0.5 oz (67.6 kg)  05/16/13  160 lb (72.576 kg)  01/31/13 168 lb (76.204 kg)  04/25/13 167 lb (75.751 kg)  12/25/12 166 lb (75.297 kg)  10/25/12 175 lb (79.379 kg)  09/25/12 166 lb (75.297 kg)    Usual Body Weight: 168 lbs (Feburary 2014)  % Usual Body Weight: 89%  BMI:  Body mass index is 26.72 kg/(m^2).  Estimated Nutritional Needs: Kcal: 1500-1650 Protein: 100-110 grams Fluid: 2 L/day  Skin: WDL  Diet Order: NPO  EDUCATION NEEDS: -Education not appropriate at this time   Intake/Output Summary (Last 24 hours) at 08/10/13 1509 Last data filed at 08/10/13 1411  Gross per 24 hour  Intake 391.25 ml  Output    600 ml  Net -208.75 ml    Last BM: PTA  Labs:   Recent Labs Lab 08/09/13 1749 08/10/13 0805  NA 128* 133*  K 3.3* 3.0*  CL 90* 99  CO2 25 24  BUN 56* 43*  CREATININE 1.64* 1.31  CALCIUM 8.5 7.8*  GLUCOSE 109* 91    CBG (last 3)  No results found for this basename: GLUCAP,  in the last 72 hours  Scheduled Meds: . lip balm  1 application Topical BID  . pantoprazole  40 mg Oral Daily  . phytonadione (VITAMIN K) IV  5 mg Intravenous Once  . piperacillin-tazobactam (ZOSYN)  IV  3.375 g Intravenous Q8H  . polyethylene glycol  17 g Oral BID  . potassium chloride  40 mEq Oral BID  .  sodium chloride  3 mL Intravenous Q12H    Continuous Infusions: . dextrose 5 % and 0.9% NaCl 75 mL/hr at 08/10/13 0207    Past Medical History  Diagnosis Date  . GERD (gastroesophageal reflux disease)   . Hypertension   . Hearing loss     wears hearing aid - right side  . Leg swelling   . Trouble swallowing   . Bruises easily     due to coumadin  . Weakness     difficulty walking  . Contact lens/glasses fitting   . Stroke 1999  . Cancer   . 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 Gait disturbance   . Dyslipidemia   . COPD (chronic obstructive pulmonary disease)   . Peripheral vascular disease   . History of melanoma   . Diverticulosis   . Abnormality of gait 05/16/2013  . Choledocholithiasis  with obstruction 06/28/13  . Embolic stroke   . PAF (paroxysmal atrial fibrillation)   . Hyperlipidemia   . Bradycardia   . Protein-calorie malnutrition, severe   . Ventricular tachycardia (paroxysmal)   . Obstructive sleep apnea of adult     uses cpap, pt does not know setting  . Personal history of fall 05/29/2013  . Fracture of femoral neck, right, closed 3114  . Long term (current) use of anticoagulants     PAF and embolic CVA  . Anemia, unspecified 07/05/2013  . Xerophthalmia 07/05/2013  . 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 Actinic keratosis   . Seborrheic keratosis     Past Surgical History  Procedure Laterality Date  . Excision of melanoma  2009    left leg  . Melanoma excision      many melanoma removed in past  . Melanoma excision  10/11/2011    Procedure: MELANOMA EXCISION;  Surgeon: Valarie Merino, MD;  Location: [facility] OR;  Service: General;  Laterality: Left;  EXCISION melanoma left leg with full thickness skin grafting from left lower abdomen.  . Joint replacement  2012    r fem head fx  . Lung benign area removed   1970's  . Hernia repair  1983  . Melanoma excision  05/16/2012    Procedure: MELANOMA EXCISION;  Surgeon: Valarie Merino, MD;  Location: [facility];  Service: General;  Laterality: Left;  Nodule Removal of Melanoma on Left Shin  . Ercp N/A 07/04/2013    Procedure: ENDOSCOPIC RETROGRADE CHOLANGIOPANCREATOGRAPHY (ERCP);  Surgeon: Hilarie Fredrickson, MD;  Location: [facility];  Service: Endoscopy;  Laterality: N/A;    Ian Malkin RD, LDN Inpatient Clinical Dietitian Pager: [phone] After Hours Pager: [phone]

## 2013-08-10 NOTE — Progress Notes (Addendum)
[facility] PROGRESS NOTE  EBB CARELOCK ZOX:[id number] DOB: July 17, 1919 DOA: 08/09/2013 PCP: [name], MD  Brief narrative: 79 -year-old male with multiple medical comorbidities including but not limited to atrial fibrillation on anticoagulation with Coumadin, prior history of stroke, hypertension, prior history of skin cancer, obstructive sleep apnea on CPAP, admitted recently for choledocholithiasis and cholecystitis, s/p ERCP with splinterotomy and stone removalwho presented [facility] from assisted living facility with confusion and fever. Patient was ultimately found to have acute cholecystitis. In ED, vitals were stable with blood pressure of 100/60, heart rate 64, T max 101.2 F and O2 saturation of 85% on room air. Chest x-ray showed stable findings with no evidence of acute cardiopulmonary process. Abdominal ultrasound showed cholelithiasis and new gallbladder wall thickening, findings concerning for possible acute cholecystitis. CBC revealed white blood cell count of 23 and hemoglobin of 12.7. BMP revealed a sodium of 128, potassium of 3.3 and creatinine of 1.64. INR was 2.16, therapeutic.  Assessment/Plan:  Principal Problem:   Acute cholecystitis - Appreciate surgery consult and recommendations, plan for biliary drain 08/11/2013 - Appreciate cardiology consult for medical clearance - We will reverse INR with vitamin K 5 mg IV in the anticipation for surgery tomorrow - Continue Zosyn IV per pharmacy - Continue IV fluids, D5 at 75 cc an hour - Continue pain management with fentanyl 25-50 mcg every 1 hour as needed IV for severe pain. May use Dilaudid 0.5 mg IV every 2 hours as needed for intractable severe pain. Active Problems:   Leukocytosis, unspecified - Secondary to acute cholecystitis - White blood cell count trending down   Acute respiratory failure with hypoxia - Perhaps related to severe pain - Patient is saturating 98% on 2 L nasal cannula   HYPERTENSION - Blood  pressure is 114/53 - Not on antihypertensive medications at this time   Atrial fibrillation - Coumadin on hold. - We will reverse INR with vitamin K 5 mg IV in the anticipation of surgery tomorrow   CVA (cerebral vascular accident) - Hold Coumadin  - on long term current use of anticoagulant   Hypokalemia - Repleted - Followup BMP in the morning   Hyponatremia - Secondary to dehydration - Continue IV fluids   Acute renal failure -  Secondary to pre-renal etiology, dehydration - Creatinine is now within normal limits   Anemia of chronic disease - duey to prior history of skin cancer - Hemoglobin 10.8   GERD - Continue protonix   Protein-calorie malnutrition, severe - Nutrition consulted  Code Status: DNR/DNI Family Communication: no family at the bedside Disposition Plan: home when stable   [name], MD  [facility] Pager [phone]  If 7PM-7AM, please contact night-coverage [email or web address] Password [email or web address] 08/10/2013, 9:29 AM   LOS: 1 day   Consultants:  Surgery  Cardiology  Procedures:  None; plan for biliary drain for 08/11/2013  Antibiotics:  Zosyn 08/10/2013 -->  HPI/Subjective: No overnight events.  Objective: Filed Vitals:   08/09/13 2114 08/10/13 0010 08/10/13 0058 08/10/13 0455  BP: 100/60 100/47 111/52 114/53  Pulse: 93 64 68 70  Temp: 99 F (37.2 C) 98.1 F (36.7 C) 98.1 F (36.7 C) 98.2 F (36.8 C)  TempSrc: Oral Oral Oral Oral  Resp: 21 18 18 18   Height:   5\' 3"  (1.6 m)   Weight:   68.4 kg (150 lb 12.7 oz)   SpO2: 98% 95% 94% 98%    Intake/Output Summary (Last 24 hours) at 08/10/13 8295 Last data filed at  08/10/13 0600  Gross per 24 hour  Intake 391.25 ml  Output    300 ml  Net  91.25 ml    Exam:   General:  Pt is alert, not in acute distress  Cardiovascular: irregular rhythm, rate controlled, S1/S2 appreciated  Respiratory: Clear to auscultation bilaterally, no wheezing, no crackles, no rhonchi  Abdomen: Soft, non  tender, non distended, bowel sounds present, no guarding  Extremities: (+1) LE pitting edema, pulses DP and PT palpable bilaterally  Neuro: Grossly nonfocal  Data Reviewed: Basic Metabolic Panel:  Recent Labs Lab 08/09/13 1749 08/10/13 0805  NA 128* 133*  K 3.3* 3.0*  CL 90* 99  CO2 25 24  GLUCOSE 109* 91  BUN 56* 43*  CREATININE 1.64* 1.31  CALCIUM 8.5 7.8*   Liver Function Tests:  Recent Labs Lab 08/09/13 1749 08/10/13 0805  AST 28 15  ALT 22 15  ALKPHOS 259* 194*  BILITOT 1.0 0.8  PROT 6.6 5.2*  ALBUMIN 2.5* 1.8*    Recent Labs Lab 08/09/13 1805  LIPASE 13   No results found for this basename: AMMONIA,  in the last 168 hours CBC:  Recent Labs Lab 08/09/13 1749 08/10/13 0805  WBC 23.0* 19.3*  NEUTROABS 20.3*  --   HGB 12.7* 10.8*  HCT 38.6* 32.9*  MCV 88.3 89.2  PLT 280 264    MRSA PCR SCREENING     Status: None   Collection Time    08/10/13  1:12 AM      Result Value Range Status   MRSA by PCR NEGATIVE  NEGATIVE Final     Studies: US Abdomen Complete 08/09/2013   IMPRESSION: 1.  Cholelithiasis and new gallbladder wall thickening.  Although there is no sonographic Murphy's sign, findings could represent acute cholecystitis.  Consider biliary scintigraphy if there is clinical uncertainty. 2.  From July 2014, improved biliary ductal dilatation status post sphincterotomy and CBD stone extraction.  Pneumobilia decreases sensitivity for detecting current choledocholithiasis.   Original Report Authenticated By: Tiburcio Pea   Dg Chest Port 1 View 08/09/2013    IMPRESSION:  Essentially stable chest x-ray without evidence of new acute cardiopulmonary process.  Stable pleural thickening versus small right pleural effusion.   Original Report Authenticated By: Malachy Moan, M.D.    Scheduled Meds: . pantoprazole  40 mg Oral Daily  . piperacillin-tazobactam  3.375 g Intravenous Q8H   Continuous Infusions: . dextrose 5 % and 0.9% NaCl 75 mL/hr at  08/10/13 0207

## 2013-08-10 NOTE — Progress Notes (Signed)
Pt did not want to wear his CPAP tonight. He stated it was to late to wear it and that he would just try it tomorrow night. Pt is comfortable and in no distress. RT made pt aware that if he changed his mind to please call.

## 2013-08-10 NOTE — Progress Notes (Signed)
Patient ID: [name], male   DOB: 1919/10/03, 79 y.o.   MRN: [id number] Request received for placement of a percutaneous cholecystostomy tube in pt with acute on chronic cholecystitis, elevated LFT's/WBC , recent fevers and high surgical risk. Imaging studies were reviewed. Pt has also been on coumadin for PAF/CVA. Additional hx as below. Exam: pt awake/alert; very HOH; chest- sl dim BS rt base, left clear; heart- irreg irreg; abd- soft,+BS,mildly tender RUQ to palpation; ext- no sig edema.    Filed Vitals:   08/09/13 2114 08/10/13 0010 08/10/13 0058 08/10/13 0455  BP: 100/60 100/47 111/52 114/53  Pulse: 93 64 68 70  Temp: 99 F (37.2 C) 98.1 F (36.7 C) 98.1 F (36.7 C) 98.2 F (36.8 C)  TempSrc: Oral Oral Oral Oral  Resp: 21 18 18 18   Height:   5\' 3"  (1.6 m)   Weight:   150 lb 12.7 oz (68.4 kg)   SpO2: 98% 95% 94% 98%   Past Medical History  Diagnosis Date  . GERD (gastroesophageal reflux disease)   . Hypertension   . Hearing loss     wears hearing aid - right side  . Leg swelling   . Trouble swallowing   . Bruises easily     due to coumadin  . Weakness     difficulty walking  . Contact lens/glasses fitting   . Stroke 1999  . Cancer   . 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 Gait disturbance   . Dyslipidemia   . COPD (chronic obstructive pulmonary disease)   . Peripheral vascular disease   . History of melanoma   . Diverticulosis   . Abnormality of gait 05/16/2013  . Choledocholithiasis with obstruction 06/28/13  . Embolic stroke   . PAF (paroxysmal atrial fibrillation)   . Hyperlipidemia   . Bradycardia   . Protein-calorie malnutrition, severe   . Ventricular tachycardia (paroxysmal)   . Obstructive sleep apnea of adult     uses cpap, pt does not know setting  . Personal history of fall 05/29/2013  . Fracture of femoral neck, right, closed 3114  . Long term (current) use of anticoagulants     PAF and embolic CVA  . Anemia, unspecified 07/05/2013  . Xerophthalmia  07/05/2013  . 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 Actinic keratosis   . Seborrheic keratosis    Past Surgical History  Procedure Laterality Date  . Excision of melanoma  2009    left leg  . Melanoma excision      many melanoma removed in past  . Melanoma excision  10/11/2011    Procedure: MELANOMA EXCISION;  Surgeon: Valarie Merino, MD;  Location: [facility] OR;  Service: General;  Laterality: Left;  EXCISION melanoma left leg with full thickness skin grafting from left lower abdomen.  . Joint replacement  2012    r fem head fx  . Lung benign area removed   1970's  . Hernia repair  1983  . Melanoma excision  05/16/2012    Procedure: MELANOMA EXCISION;  Surgeon: Valarie Merino, MD;  Location: [facility];  Service: General;  Laterality: Left;  Nodule Removal of Melanoma on Left Shin  . Ercp N/A 07/04/2013    Procedure: ENDOSCOPIC RETROGRADE CHOLANGIOPANCREATOGRAPHY (ERCP);  Surgeon: Hilarie Fredrickson, MD;  Location: [facility];  Service: Endoscopy;  Laterality: N/A;   US Abdomen Complete  08/09/2013   *RADIOLOGY REPORT*  Clinical Data:  Nausea and fever.  COMPLETE ABDOMINAL ULTRASOUND  Comparison:  [place] 06/28/2013.  Findings:  Gallbladder:  New gallbladder wall thickening, 8 mm in maximal thickness, with mild striated appearance.  There is layering sludge and gallstones.  Noted gallstone in the neck of the gallbladder appears fixed. No sonographic Murphy's sign.  Common bile duct:  8 mm diameter.  There is also intrahepatic biliary ductal dilatation with multiple bright spectral reflectors that shadow.  This is consistent with pneumobilia in a patient status post sphincterotomy. Dilation is less than prior.  Twinkle artifact within the CBD, without bright reflector.  Liver:  No focal lesion identified.  Within normal limits in parenchymal echogenicity.  IVC:  Normal where seen.  Pancreas:  Normal where seen.  No pancreatic ductal dilatation.  Spleen:  Measures 10 cm.  Right Kidney:  Diffuse cortical thinning with a 10 cm  length.  No hydronephrosis.  Left Kidney:  Diffuse cortical thinning within a 10 cm length.  No hydronephrosis.  Abdominal aorta:  No aneurysm where seen.  Maximal diameter proximally, 3.1 cm.  IMPRESSION: 1.  Cholelithiasis and new gallbladder wall thickening.  Although there is no sonographic Murphy's sign, findings could represent acute cholecystitis.  Consider biliary scintigraphy if there is clinical uncertainty. 2.  From July 2014, improved biliary ductal dilatation status post sphincterotomy and CBD stone extraction.  Pneumobilia decreases sensitivity for detecting current choledocholithiasis.   Original Report Authenticated By: Tiburcio Pea   Dg Chest Port 1 View  08/09/2013   *RADIOLOGY REPORT*  Clinical Data: Fever, weakness  PORTABLE CHEST - 1 VIEW  Comparison: Prior chest x-ray 07/04/2013  Findings: Stable cardiomegaly with left ventricular prominence. Atherosclerotic calcifications noted in the transverse aorta. Inspiratory volumes remain low.  There are bibasilar opacities which are similar compared to prior.  Surgical changes suggest prior thoracotomy with partial resection of the right fifth rib. No definite new airspace consolidation, pneumothorax or pulmonary edema.  Similar appearance of blunting of the right costophrenic angle which may reflect a small pleural effusion.  No acute osseous abnormality.  IMPRESSION:  Essentially stable chest x-ray without evidence of new acute cardiopulmonary process.  Stable pleural thickening versus small right pleural effusion.   Original Report Authenticated By: Malachy Moan, M.D.  Results for orders placed during the hospital encounter of 08/09/13  MRSA PCR SCREENING      Result Value Range   MRSA by PCR NEGATIVE  NEGATIVE  CBC WITH DIFFERENTIAL      Result Value Range   WBC 23.0 (*) 4.0 - 10.5 K/uL   RBC 4.37  4.22 - 5.81 MIL/uL   Hemoglobin 12.7 (*) 13.0 - 17.0 g/dL   HCT 16.1 (*) 09.6 - 04.5 %   MCV 88.3  78.0 - 100.0 fL   MCH 29.1  26.0  - 34.0 pg   MCHC 32.9  30.0 - 36.0 g/dL   RDW 40.9  81.1 - 91.4 %   Platelets 280  150 - 400 K/uL   Neutrophils Relative % 89 (*) 43 - 77 %   Neutro Abs 20.3 (*) 1.7 - 7.7 K/uL   Lymphocytes Relative 4 (*) 12 - 46 %   Lymphs Abs 0.9  0.7 - 4.0 K/uL   Monocytes Relative 8  3 - 12 %   Monocytes Absolute 1.8 (*) 0.1 - 1.0 K/uL   Eosinophils Relative 0  0 - 5 %   Eosinophils Absolute 0.0  0.0 - 0.7 K/uL   Basophils Relative 0  0 - 1 %   Basophils Absolute 0.0  0.0 - 0.1 K/uL  COMPREHENSIVE METABOLIC  PANEL      Result Value Range   Sodium 128 (*) 135 - 145 mEq/L   Potassium 3.3 (*) 3.5 - 5.1 mEq/L   Chloride 90 (*) 96 - 112 mEq/L   CO2 25  19 - 32 mEq/L   Glucose, Bld 109 (*) 70 - 99 mg/dL   BUN 56 (*) 6 - 23 mg/dL   Creatinine, Ser 1.02 (*) 0.50 - 1.35 mg/dL   Calcium 8.5  8.4 - 72.5 mg/dL   Total Protein 6.6  6.0 - 8.3 g/dL   Albumin 2.5 (*) 3.5 - 5.2 g/dL   AST 28  0 - 37 U/L   ALT 22  0 - 53 U/L   Alkaline Phosphatase 259 (*) 39 - 117 U/L   Total Bilirubin 1.0  0.3 - 1.2 mg/dL   GFR calc non Af Amer 35 (*) >90 mL/min   GFR calc Af Amer 41 (*) >90 mL/min  URINALYSIS, ROUTINE W REFLEX MICROSCOPIC      Result Value Range   Color, Urine YELLOW  YELLOW   APPearance CLOUDY (*) CLEAR   Specific Gravity, Urine 1.021  1.005 - 1.030   pH 5.5  5.0 - 8.0   Glucose, UA NEGATIVE  NEGATIVE mg/dL   Hgb urine dipstick LARGE (*) NEGATIVE   Bilirubin Urine NEGATIVE  NEGATIVE   Ketones, ur NEGATIVE  NEGATIVE mg/dL   Protein, ur NEGATIVE  NEGATIVE mg/dL   Urobilinogen, UA 1.0  0.0 - 1.0 mg/dL   Nitrite NEGATIVE  NEGATIVE   Leukocytes, UA MODERATE (*) NEGATIVE  LIPASE, BLOOD      Result Value Range   Lipase 13  11 - 59 U/L  APTT      Result Value Range   aPTT 47 (*) 24 - 37 seconds  PROTIME-INR      Result Value Range   Prothrombin Time 23.4 (*) 11.6 - 15.2 seconds   INR 2.16 (*) 0.00 - 1.49  URINE MICROSCOPIC-ADD ON      Result Value Range   Squamous Epithelial / LPF RARE  RARE    WBC, UA 7-10  <3 WBC/hpf   RBC / HPF 21-50  <3 RBC/hpf   Bacteria, UA RARE  RARE  COMPREHENSIVE METABOLIC PANEL      Result Value Range   Sodium 133 (*) 135 - 145 mEq/L   Potassium 3.0 (*) 3.5 - 5.1 mEq/L   Chloride 99  96 - 112 mEq/L   CO2 24  19 - 32 mEq/L   Glucose, Bld 91  70 - 99 mg/dL   BUN 43 (*) 6 - 23 mg/dL   Creatinine, Ser 3.66  0.50 - 1.35 mg/dL   Calcium 7.8 (*) 8.4 - 10.5 mg/dL   Total Protein 5.2 (*) 6.0 - 8.3 g/dL   Albumin 1.8 (*) 3.5 - 5.2 g/dL   AST 15  0 - 37 U/L   ALT 15  0 - 53 U/L   Alkaline Phosphatase 194 (*) 39 - 117 U/L   Total Bilirubin 0.8  0.3 - 1.2 mg/dL   GFR calc non Af Amer 46 (*) >90 mL/min   GFR calc Af Amer 53 (*) >90 mL/min  PROTIME-INR      Result Value Range   Prothrombin Time 23.1 (*) 11.6 - 15.2 seconds   INR 2.12 (*) 0.00 - 1.49  CBC      Result Value Range   WBC 19.3 (*) 4.0 - 10.5 K/uL   RBC 3.69 (*) 4.22 - 5.81  MIL/uL   Hemoglobin 10.8 (*) 13.0 - 17.0 g/dL   HCT 16.1 (*) 09.6 - 04.5 %   MCV 89.2  78.0 - 100.0 fL   MCH 29.3  26.0 - 34.0 pg   MCHC 32.8  30.0 - 36.0 g/dL   RDW 40.9  81.1 - 91.4 %   Platelets 264  150 - 400 K/uL  CG4 I-STAT (LACTIC ACID)      Result Value Range   Lactic Acid, Venous 1.27  0.5 - 2.2 mmol/L   A/P: Pt with acute on chronic cholecystitis, elevated LFT's/WBC, fevers and high surgical risk. Also on coumadin as OP for CVA/PAF. Tent plan is for placement of a percutaneous cholecystostomy once PT/INR within normal limits(currently 23.1/2.12). Details/risks of procedure d/w pt/pt's son with their understanding and consent. Will recheck labs in am and proceed with cholecystostomy if PT/INR ok.

## 2013-08-10 NOTE — Progress Notes (Signed)
PT Cancellation Note  Patient Details Name: RILLEY STASH MRN: [id number] DOB: 03-Jan-1919   Cancelled Treatment:    Reason Eval/Treat Not Completed: Medical issues which prohibited therapy, planned possible IR procedure. Will check on 08/11/13.   Rada Hay 08/10/2013, 3:03 PM Blanchard Kelch PT [phone]

## 2013-08-10 NOTE — Progress Notes (Signed)
Patient seen and examined.  He transiently felt better after the ERCP but for the past few weeks has lost his appetite and felt poorly.  I suspect he has had cholecystitis for quite some time now and it became severe enough to lead to his admissions.  In these cases, it is likely that the gallbladder has acute on chronic inflammation with obliteration of normal tissue planes making operative complications significantly higher.  Also, he has severe malnutrition which is a leading predictor of operative complications.  For these reasons, I recommend IR percutaneous cholecystostomy and delayed cholecystectomy.  Drain will need to be in for 6-8 weeks.

## 2013-08-11 ENCOUNTER — Inpatient Hospital Stay (HOSPITAL_COMMUNITY): Payer: Medicare Other

## 2013-08-11 DIAGNOSIS — R6889 Other general symptoms and signs: Secondary | ICD-10-CM | POA: Diagnosis present

## 2013-08-11 DIAGNOSIS — R509 Fever, unspecified: Secondary | ICD-10-CM | POA: Diagnosis present

## 2013-08-11 LAB — APTT: aPTT: 39 seconds — ABNORMAL HIGH (ref 24–37)

## 2013-08-11 LAB — BASIC METABOLIC PANEL
CO2: 23 mEq/L (ref 19–32)
Calcium: 7.8 mg/dL — ABNORMAL LOW (ref 8.4–10.5)
Creatinine, Ser: 1.23 mg/dL (ref 0.50–1.35)
GFR calc non Af Amer: 49 mL/min — ABNORMAL LOW (ref >90)
Sodium: 133 mEq/L — ABNORMAL LOW (ref 135–145)

## 2013-08-11 LAB — CBC
MCV: 90.1 fL (ref 78.0–100.0)
Platelets: 300 10*3/uL (ref 150–400)
RBC: 3.44 MIL/uL — ABNORMAL LOW (ref 4.22–5.81)
WBC: 18.7 10*3/uL — ABNORMAL HIGH (ref 4.0–10.5)

## 2013-08-11 LAB — URINE CULTURE

## 2013-08-11 LAB — PROTIME-INR: Prothrombin Time: 15.1 seconds (ref 11.6–15.2)

## 2013-08-11 MED ORDER — VANCOMYCIN HCL IN DEXTROSE 1-5 GM/200ML-% IV SOLN
1000.0000 mg | Freq: Once | INTRAVENOUS | Status: AC
  Administered 2013-08-11: 1000 mg via INTRAVENOUS
  Filled 2013-08-11: qty 200

## 2013-08-11 MED ORDER — LIDOCAINE HCL 1 % IJ SOLN
INTRAMUSCULAR | Status: AC
  Filled 2013-08-11: qty 20

## 2013-08-11 MED ORDER — MIDAZOLAM HCL 2 MG/2ML IJ SOLN
INTRAMUSCULAR | Status: AC | PRN
  Administered 2013-08-11: 0.5 mg via INTRAVENOUS

## 2013-08-11 MED ORDER — FENTANYL CITRATE 0.05 MG/ML IJ SOLN
INTRAMUSCULAR | Status: AC
  Filled 2013-08-11: qty 2

## 2013-08-11 MED ORDER — VANCOMYCIN HCL IN DEXTROSE 1-5 GM/200ML-% IV SOLN
1000.0000 mg | Freq: Every day | INTRAVENOUS | Status: DC
  Administered 2013-08-11 – 2013-08-12 (×2): 1000 mg via INTRAVENOUS
  Filled 2013-08-11 (×3): qty 200

## 2013-08-11 MED ORDER — MIDAZOLAM HCL 2 MG/2ML IJ SOLN
INTRAMUSCULAR | Status: AC
  Filled 2013-08-11: qty 4

## 2013-08-11 MED ORDER — FENTANYL CITRATE 0.05 MG/ML IJ SOLN
INTRAMUSCULAR | Status: AC | PRN
  Administered 2013-08-11: 25 ug via INTRAVENOUS

## 2013-08-11 NOTE — Progress Notes (Signed)
Subjective: Some RUQ pain.  Objective: Vital signs in last 24 hours: Temp:  [98.2 F (36.8 C)-100.5 F (38.1 C)] 100.5 F (38.1 C) (09/06 0546) Pulse Rate:  [70-80] 80 (09/06 0546) Resp:  [16-19] 16 (09/06 0546) BP: (113-157)/(48-88) 139/51 mmHg (09/06 0546) SpO2:  [96 %-100 %] 96 % (09/06 0546)    Intake/Output from previous day: 09/05 0701 - 09/06 0700 In: 900 [I.V.:900] Out: 900 [Urine:900] Intake/Output this shift:    PE: General- In NAD Abdomen-Firm, fixed, tender, RUQ mass  Lab Results:   Recent Labs  08/10/13 0805 08/11/13 0405  WBC 19.3* 18.7*  HGB 10.8* 10.1*  HCT 32.9* 31.0*  PLT 264 300   BMET  Recent Labs  08/10/13 0805 08/11/13 0405  NA 133* 133*  K 3.0* 3.6  CL 99 103  CO2 24 23  GLUCOSE 91 136*  BUN 43* 32*  CREATININE 1.31 1.23  CALCIUM 7.8* 7.8*   PT/INR  Recent Labs  08/10/13 0805 08/11/13 0405  LABPROT 23.1* 15.1  INR 2.12* 1.22   Comprehensive Metabolic Panel:    Component Value Date/Time   NA 133* 08/11/2013 0405   K 3.6 08/11/2013 0405   CL 103 08/11/2013 0405   CO2 23 08/11/2013 0405   BUN 32* 08/11/2013 0405   CREATININE 1.23 08/11/2013 0405   GLUCOSE 136* 08/11/2013 0405   CALCIUM 7.8* 08/11/2013 0405   AST 15 08/10/2013 0805   ALT 15 08/10/2013 0805   ALKPHOS 194* 08/10/2013 0805   BILITOT 0.8 08/10/2013 0805   PROT 5.2* 08/10/2013 0805   ALBUMIN 1.8* 08/10/2013 0805     Studies/Results: US Abdomen Complete  08/09/2013   *RADIOLOGY REPORT*  Clinical Data:  Nausea and fever.  COMPLETE ABDOMINAL ULTRASOUND  Comparison:  [place] 06/28/2013.  Findings:  Gallbladder:  New gallbladder wall thickening, 8 mm in maximal thickness, with mild striated appearance.  There is layering sludge and gallstones.  Noted gallstone in the neck of the gallbladder appears fixed. No sonographic Murphy's sign.  Common bile duct:  8 mm diameter.  There is also intrahepatic biliary ductal dilatation with multiple bright spectral reflectors that shadow.  This is  consistent with pneumobilia in a patient status post sphincterotomy. Dilation is less than prior.  Twinkle artifact within the CBD, without bright reflector.  Liver:  No focal lesion identified.  Within normal limits in parenchymal echogenicity.  IVC:  Normal where seen.  Pancreas:  Normal where seen.  No pancreatic ductal dilatation.  Spleen:  Measures 10 cm.  Right Kidney:  Diffuse cortical thinning with a 10 cm length.  No hydronephrosis.  Left Kidney:  Diffuse cortical thinning within a 10 cm length.  No hydronephrosis.  Abdominal aorta:  No aneurysm where seen.  Maximal diameter proximally, 3.1 cm.  IMPRESSION: 1.  Cholelithiasis and new gallbladder wall thickening.  Although there is no sonographic Murphy's sign, findings could represent acute cholecystitis.  Consider biliary scintigraphy if there is clinical uncertainty. 2.  From July 2014, improved biliary ductal dilatation status post sphincterotomy and CBD stone extraction.  Pneumobilia decreases sensitivity for detecting current choledocholithiasis.   Original Report Authenticated By: Tiburcio Pea   Dg Chest Port 1 View  08/09/2013   *RADIOLOGY REPORT*  Clinical Data: Fever, weakness  PORTABLE CHEST - 1 VIEW  Comparison: Prior chest x-ray 07/04/2013  Findings: Stable cardiomegaly with left ventricular prominence. Atherosclerotic calcifications noted in the transverse aorta. Inspiratory volumes remain low.  There are bibasilar opacities which are similar compared to prior.  Surgical  changes suggest prior thoracotomy with partial resection of the right fifth rib. No definite new airspace consolidation, pneumothorax or pulmonary edema.  Similar appearance of blunting of the right costophrenic angle which may reflect a small pleural effusion.  No acute osseous abnormality.  IMPRESSION:  Essentially stable chest x-ray without evidence of new acute cardiopulmonary process.  Stable pleural thickening versus small right pleural effusion.   Original Report  Authenticated By: Malachy Moan, M.D.    Anti-infectives: Anti-infectives   Start     Dose/Rate Route Frequency Ordered Stop   08/11/13 2200  vancomycin (VANCOCIN) IVPB 1000 mg/200 mL premix     1,000 mg 200 mL/hr over 60 Minutes Intravenous Daily at bedtime 08/11/13 0129     08/11/13 0130  vancomycin (VANCOCIN) IVPB 1000 mg/200 mL premix     1,000 mg 200 mL/hr over 60 Minutes Intravenous  Once 08/11/13 0119 08/11/13 0359   08/09/13 2200  piperacillin-tazobactam (ZOSYN) IVPB 3.375 g     3.375 g 12.5 mL/hr over 240 Minutes Intravenous 3 times per day 08/09/13 2122        Assessment Principal Problem:   Acute cholecystitis, cholelithiasis-for perc drain today; suspect he would need open cholecystectomy given clinical findings which would significantly increase his operative risk in my opinion=>perc. cholecystostomy Active Problems:   HYPERTENSION   GERD   CVA (cerebral vascular accident)   Long term current use of anticoagulant   Protein-calorie malnutrition, severe   Atrial fibrillation   Acute respiratory failure with hypoxia   Hypokalemia   Leukocytosis, unspecified   Anemia of chronic disease   Hyponatremia   Acute renal failure   Fever, unspecified   Gram-positive cocci in clusters    LOS: 2 days   Plan: IR to put in percutaneous cholecystostomy today.   [name] 08/11/2013

## 2013-08-11 NOTE — Progress Notes (Signed)
Subjective:  No new complaints, no CP, no SOB  Objective:  Vital Signs in the last 24 hours: Temp:  [98.2 F (36.8 C)-100.5 F (38.1 C)] 100.5 F (38.1 C) (09/06 0546) Pulse Rate:  [70-80] 80 (09/06 0546) Resp:  [16-19] 16 (09/06 0546) BP: (113-157)/(48-88) 139/51 mmHg (09/06 0546) SpO2:  [96 %-100 %] 96 % (09/06 0546)  Intake/Output from previous day: 09/05 0701 - 09/06 0700 In: 900 [I.V.:900] Out: 900 [Urine:900]   Physical Exam: General: Well developed, well nourished, in no acute distress. Head:  Normocephalic and atraumatic. Lungs: Clear to auscultation and percussion. Heart: Normal S1 and S2 with ectopy.  2/6 SM RUSB, no rubs or gallops.  Abdomen: soft, non-tender, positive bowel sounds. Extremities: No clubbing or cyanosis. trace edema. Neurologic: Alert and oriented x 3.    Lab Results:  Recent Labs  08/10/13 0805 08/11/13 0405  WBC 19.3* 18.7*  HGB 10.8* 10.1*  PLT 264 300    Recent Labs  08/10/13 0805 08/11/13 0405  NA 133* 133*  K 3.0* 3.6  CL 99 103  CO2 24 23  GLUCOSE 91 136*  BUN 43* 32*  CREATININE 1.31 1.23   No results found for this basename: TROPONINI, CK, MB,  in the last 72 hours Hepatic Function Panel  Recent Labs  08/10/13 0805  PROT 5.2*  ALBUMIN 1.8*  AST 15  ALT 15  ALKPHOS 194*  BILITOT 0.8     Imaging: US Abdomen Complete  08/09/2013   *RADIOLOGY REPORT*  Clinical Data:  Nausea and fever.  COMPLETE ABDOMINAL ULTRASOUND  Comparison:  [place] 06/28/2013.  Findings:  Gallbladder:  New gallbladder wall thickening, 8 mm in maximal thickness, with mild striated appearance.  There is layering sludge and gallstones.  Noted gallstone in the neck of the gallbladder appears fixed. No sonographic Murphy's sign.  Common bile duct:  8 mm diameter.  There is also intrahepatic biliary ductal dilatation with multiple bright spectral reflectors that shadow.  This is consistent with pneumobilia in a patient status post sphincterotomy. Dilation  is less than prior.  Twinkle artifact within the CBD, without bright reflector.  Liver:  No focal lesion identified.  Within normal limits in parenchymal echogenicity.  IVC:  Normal where seen.  Pancreas:  Normal where seen.  No pancreatic ductal dilatation.  Spleen:  Measures 10 cm.  Right Kidney:  Diffuse cortical thinning with a 10 cm length.  No hydronephrosis.  Left Kidney:  Diffuse cortical thinning within a 10 cm length.  No hydronephrosis.  Abdominal aorta:  No aneurysm where seen.  Maximal diameter proximally, 3.1 cm.  IMPRESSION: 1.  Cholelithiasis and new gallbladder wall thickening.  Although there is no sonographic Murphy's sign, findings could represent acute cholecystitis.  Consider biliary scintigraphy if there is clinical uncertainty. 2.  From July 2014, improved biliary ductal dilatation status post sphincterotomy and CBD stone extraction.  Pneumobilia decreases sensitivity for detecting current choledocholithiasis.   Original Report Authenticated By: Tiburcio Pea   Dg Chest Port 1 View  08/09/2013   *RADIOLOGY REPORT*  Clinical Data: Fever, weakness  PORTABLE CHEST - 1 VIEW  Comparison: Prior chest x-ray 07/04/2013  Findings: Stable cardiomegaly with left ventricular prominence. Atherosclerotic calcifications noted in the transverse aorta. Inspiratory volumes remain low.  There are bibasilar opacities which are similar compared to prior.  Surgical changes suggest prior thoracotomy with partial resection of the right fifth rib. No definite new airspace consolidation, pneumothorax or pulmonary edema.  Similar appearance of blunting of the right  costophrenic angle which may reflect a small pleural effusion.  No acute osseous abnormality.  IMPRESSION:  Essentially stable chest x-ray without evidence of new acute cardiopulmonary process.  Stable pleural thickening versus small right pleural effusion.   Original Report Authenticated By: Malachy Moan, M.D.   Personally viewed.   Telemetry:  SR, PVC's Personally viewed.   EKG:  NSR, NSSTW changes  Cardiac Studies:  EF normal. Basal inf WMA, mild AS  Assessment/Plan:  Principal Problem:   Acute cholecystitis Active Problems:   HYPERTENSION   GERD   CVA (cerebral vascular accident)   Long term current use of anticoagulant   Protein-calorie malnutrition, severe   Atrial fibrillation   Acute respiratory failure with hypoxia   Hypokalemia   Leukocytosis, unspecified   Anemia of chronic disease   Hyponatremia   Acute renal failure   Fever, unspecified   Gram-positive cocci in clusters   - PRE OP  - agree with Dr. Wyline Mood  - may proceed with surgery with intermediate risk  - No evidence of heart failure, no dangerous arrhythmia (history of bradycardia so avoiding beta blocker), no high risk valvular abnormality, no MI  - Stroke history, PAF noted CHADS -4    Kamyla Olejnik 08/11/2013, 8:34 AM

## 2013-08-11 NOTE — Progress Notes (Signed)
ANTIBIOTIC CONSULT NOTE - INITIAL  Pharmacy Consult for Vancomycin Indication: positive blood culture of gram + cocci in clusters  Allergies  Allergen Reactions  . Sulfonamide Derivatives Rash    REACTION: rash    Patient Measurements: Height: 5\' 3"  (160 cm) Weight: 150 lb 12.7 oz (68.4 kg) IBW/kg (Calculated) : 56.9 Adjusted Body Weight:   Vital Signs: Temp: 99.2 F (37.3 C) (09/05 2051) Temp src: Oral (09/05 2051) BP: 157/88 mmHg (09/05 2051) Pulse Rate: 80 (09/05 2051) Intake/Output from previous day: 09/05 0701 - 09/06 0700 In: 900 [I.V.:900] Out: 500 [Urine:500] Intake/Output from this shift: Total I/O In: 0  Out: 200 [Urine:200]  Labs:  Recent Labs  08/09/13 1749 08/10/13 0805  WBC 23.0* 19.3*  HGB 12.7* 10.8*  PLT 280 264  CREATININE 1.64* 1.31   Estimated Creatinine Clearance: 31.9 ml/min (by C-G formula based on Cr of 1.31). No results found for this basename: VANCOTROUGH, Leodis Binet, VANCORANDOM, GENTTROUGH, GENTPEAK, GENTRANDOM, TOBRATROUGH, TOBRAPEAK, TOBRARND, AMIKACINPEAK, AMIKACINTROU, AMIKACIN,  in the last 72 hours   Microbiology: Recent Results (from the past 720 hour(s))  CULTURE, BLOOD (ROUTINE X 2)     Status: None   Collection Time    08/09/13  7:10 PM      Result Value Range Status   Specimen Description BLOOD L HAND   Final   Special Requests A BOTTLES DRAWN AEROBIC ONLY 3CC   Final   Culture  Setup Time     Final   Value: 08/10/2013 03:15     Performed at [facility]   Culture     Final   Value: GRAM POSITIVE COCCI IN CLUSTERS     0107 Note: Gram Stain Report Called to,Read Back By and Verified With: Jeannett Senior 08/11/2013 [facility]     Performed at [facility]   Report Status PENDING   Incomplete  MRSA PCR SCREENING     Status: None   Collection Time    08/10/13  1:12 AM      Result Value Range Status   MRSA by PCR NEGATIVE  NEGATIVE Final   Comment:            The GeneXpert MRSA Assay (FDA     approved for  NASAL specimens     only), is one component of a     comprehensive MRSA colonization     surveillance program. It is not     intended to diagnose MRSA     infection nor to guide or     monitor treatment for     MRSA infections.    Medical History: Past Medical History  Diagnosis Date  . GERD (gastroesophageal reflux disease)   . Hypertension   . Hearing loss     wears hearing aid - right side  . Leg swelling   . Trouble swallowing   . Bruises easily     due to coumadin  . Weakness     difficulty walking  . Contact lens/glasses fitting   . Stroke 1999  . Cancer   . 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 Gait disturbance   . Dyslipidemia   . COPD (chronic obstructive pulmonary disease)   . Peripheral vascular disease   . History of melanoma   . Diverticulosis   . Abnormality of gait 05/16/2013  . Choledocholithiasis with obstruction 06/28/13  . Embolic stroke   . PAF (paroxysmal atrial fibrillation)   . Hyperlipidemia   . Bradycardia   . Protein-calorie malnutrition, severe   .  Ventricular tachycardia (paroxysmal)   . Obstructive sleep apnea of adult     uses cpap, pt does not know setting  . Personal history of fall 05/29/2013  . Fracture of femoral neck, right, closed 3114  . Long term (current) use of anticoagulants     PAF and embolic CVA  . Anemia, unspecified 07/05/2013  . Xerophthalmia 07/05/2013  . 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 Actinic keratosis   . Seborrheic keratosis     Medications:  Anti-infectives   Start     Dose/Rate Route Frequency Ordered Stop   08/11/13 2200  vancomycin (VANCOCIN) IVPB 1000 mg/200 mL premix     1,000 mg 200 mL/hr over 60 Minutes Intravenous Daily at bedtime 08/11/13 0129     08/11/13 0130  vancomycin (VANCOCIN) IVPB 1000 mg/200 mL premix     1,000 mg 200 mL/hr over 60 Minutes Intravenous  Once 08/11/13 0119     08/09/13 2200  piperacillin-tazobactam (ZOSYN) IVPB 3.375 g     3.375 g 12.5 mL/hr over 240 Minutes Intravenous 3 times per day  08/09/13 2122       Assessment: Patient with positive blood culture of gram + cocci in clusters on culture.    Goal of Therapy:  Vancomycin trough level 15-20 mcg/ml  Plan:  Measure antibiotic drug levels at steady state Follow up culture results Vancomycin 1gm iv q24hr  Darlina Guys, Jacquenette Shone Crowford 08/11/2013,1:30 AM

## 2013-08-11 NOTE — Progress Notes (Addendum)
Patient is now s/p perc cholecystotomy tube placement. Communicated with Dr. Elisabeth Pigeon, plan to start IV heparin and Coumadin 9/7 at 1400 (24 hours after procedure). PT/INR in AM, pharmacy will f/u tomorrow and enter doses.  Geoffry Paradise, PharmD, BCPS Pager: [phone] 2:32 PM Pharmacy #: [phone]

## 2013-08-11 NOTE — Progress Notes (Signed)
PT Cancellation Note  Patient Details Name: [name] MRN: [id number] DOB: 18-Sep-1919   Cancelled Treatment:    Reason Eval/Treat Not Completed: Medical issues which prohibited therapy (pt going to IR , will check on this p.m as time permits). If not , will see first thing tomorrow.    Marella Bile 08/11/2013, 12:50 PM Marella Bile, PT Pager: [phone] 08/11/2013

## 2013-08-11 NOTE — Progress Notes (Signed)
[name], midlevel returned text and orders received.

## 2013-08-11 NOTE — Progress Notes (Addendum)
[facility] PROGRESS NOTE  KWASI JOUNG ZOX:[id number] DOB: [birth year] DOA: 08/09/2013 PCP: [name], MD  Brief narrative: 79 -year-old male with multiple medical comorbidities including but not limited to atrial fibrillation on anticoagulation with Coumadin, prior history of stroke, hypertension, prior history of skin cancer, obstructive sleep apnea on CPAP, admitted recently for choledocholithiasis and cholecystitis, s/p ERCP with splinterotomy and stone removalwho presented to [facility] ED from assisted living facility with confusion and fever. Patient was ultimately found to have acute cholecystitis.  In ED, vitals were stable with blood pressure of 100/60, heart rate 64, T max 101.2 F and O2 saturation of 85% on room air. Chest x-ray showed stable findings with no evidence of acute cardiopulmonary process. Abdominal ultrasound showed cholelithiasis and new gallbladder wall thickening, findings concerning for possible acute cholecystitis. CBC revealed white blood cell count of 23 and hemoglobin of 12.7. BMP revealed a sodium of 128, potassium of 3.3 and creatinine of 1.64. INR was 2.16, therapeutic. INR subsequently reversed with vitamin K for biliary drain placement. In addition, blood cultures growing gram positive cocci in clusters for which reason pt was started on vancomycin.  Assessment/Plan:   Principal Problem:  Acute cholecystitis  - Appreciate surgery consult and recommendations, plan for biliary drain 08/11/2013  - Cardiology consulted for medical clearance - pt will proceed with biliary drain  - Reversed INR with vitamin K 5 mg IV; INR 1.22 this morning - Continue Zosyn IV per pharmacy; added vancomycin due to gram positive cocci in clusters in blood cultures - Continue IV fluids, watch for fluid overload - Continue pain management with fentanyl 25-50 mcg every 1 hour as needed IV for severe pain. May use Dilaudid 0.5 mg IV every 2 hours as needed for intractable severe pain.   Active Problems:  Leukocytosis, unspecified  - Secondary to acute cholecystitis  - White blood cell count trending down  Fever - likely sepsis due to acute cholecystitis - now on vanco and zosyn; vanco added due to gram positive cocci in cluster in blood culutres  Acute respiratory failure with hypoxia  - Perhaps related to severe pain  - Patient is saturating 98% on 2 L nasal cannula  HYPERTENSION  - Blood pressure is 139/51 - Not on antihypertensive medications at this time  Atrial fibrillation  - Coumadin on hold.  - INR 1.22, reversed for biliary drain placement - prior 2 D ECHO in 06/2013 with EF of 55% - using metoprolol 5 mg IV every 6 hours as needed for BP control or HR control CVA (cerebral vascular accident)  - Coumadin on hold  - on long term current use of anticoagulant  Hypokalemia  - Repleted  - potassium WNL  Hyponatremia  - Secondary to dehydration  - Continue IV fluids Acute renal failure  - Secondary to pre-renal etiology, dehydration  - Creatinine is now within normal limits  Anemia of chronic disease  - Secondary to prior history of skin cancer  - Hemoglobin 10.8 --> 10.1 GERD  - Continue protonix  Protein-calorie malnutrition, severe  - Nutrition consulted   Code Status: DNR/DNI  Family Communication: no family at the bedside  Disposition Plan: home when stable   [name], MD  [facility]  Pager [phone]   Consultants:  Surgery  Cardiology Procedures:  None; plan for biliary drain for 08/11/2013 Antibiotics:  Zosyn 08/10/2013 --> Vancomycin 08/11/2013 --> for gram positive cocci in clusters in blood cultures   If 7PM-7AM, please contact night-coverage [email or web address] Password [facility] 08/11/2013,  6:36 AM   LOS: 2 days   HPI/Subjective: No overnight events.   Objective: Filed Vitals:   08/10/13 0455 08/10/13 1410 08/10/13 2051 08/11/13 0546  BP: 114/53 113/48 157/88 139/51  Pulse: 70 70 80 80  Temp: 98.2 F (36.8 C) 98.2 F  (36.8 C) 99.2 F (37.3 C) 100.5 F (38.1 C)  TempSrc: Oral Oral Oral Oral  Resp: 18 19 18 16   Height:      Weight:      SpO2: 98% 97% 100% 96%    Intake/Output Summary (Last 24 hours) at 08/11/13 0636 Last data filed at 08/11/13 0547  Gross per 24 hour  Intake    900 ml  Output    900 ml  Net      0 ml    Exam:   General:  Pt is sleeping, not in acute distress  Cardiovascular: irregular rhythm, rate controlled, S1/S2 appreciated  Respiratory: Clear to auscultation bilaterally, no wheezing, no crackles, no rhonchi  Abdomen: Soft, non tender, non distended, bowel sounds present, no guarding  Extremities: +1 pitting LE edema, pulses DP and PT palpable bilaterally  Neuro: Grossly nonfocal  Data Reviewed: Basic Metabolic Panel:  Recent Labs Lab 08/09/13 1749 08/10/13 0805 08/11/13 0405  NA 128* 133* 133*  K 3.3* 3.0* 3.6  CL 90* 99 103  CO2 25 24 23   GLUCOSE 109* 91 136*  BUN 56* 43* 32*  CREATININE 1.64* 1.31 1.23  CALCIUM 8.5 7.8* 7.8*   Liver Function Tests:  Recent Labs Lab 08/09/13 1749 08/10/13 0805  AST 28 15  ALT 22 15  ALKPHOS 259* 194*  BILITOT 1.0 0.8  PROT 6.6 5.2*  ALBUMIN 2.5* 1.8*    Recent Labs Lab 08/09/13 1805  LIPASE 13   No results found for this basename: AMMONIA,  in the last 168 hours CBC:  Recent Labs Lab 08/09/13 1749 08/10/13 0805 08/11/13 0405  WBC 23.0* 19.3* 18.7*  NEUTROABS 20.3*  --   --   HGB 12.7* 10.8* 10.1*  HCT 38.6* 32.9* 31.0*  MCV 88.3 89.2 90.1  PLT 280 264 300   Cardiac Enzymes: No results found for this basename: CKTOTAL, CKMB, CKMBINDEX, TROPONINI,  in the last 168 hours BNP: No components found with this basename: POCBNP,  CBG: No results found for this basename: GLUCAP,  in the last 168 hours  Recent Results (from the past 240 hour(s))  CULTURE, BLOOD (ROUTINE X 2)     Status: None   Collection Time    08/09/13  7:10 PM      Result Value Range Status   Specimen Description BLOOD L  HAND   Final   Special Requests A BOTTLES DRAWN AEROBIC ONLY 3CC   Final   Culture  Setup Time     Final   Value: 08/10/2013 03:15     Performed at [facility]   Culture     Final   Value: GRAM POSITIVE COCCI IN CLUSTERS     0107 Note: Gram Stain Report Called to,Read Back By and Verified With: Jeannett Senior 08/11/2013 [facility]     Performed at [facility]   Report Status PENDING   Incomplete  MRSA PCR SCREENING     Status: None   Collection Time    08/10/13  1:12 AM      Result Value Range Status   MRSA by PCR NEGATIVE  NEGATIVE Final   Comment:            The  GeneXpert MRSA Assay (FDA     approved for NASAL specimens     only), is one component of a     comprehensive MRSA colonization     surveillance program. It is not     intended to diagnose MRSA     infection nor to guide or     monitor treatment for     MRSA infections.     Studies: US Abdomen Complete  08/09/2013   *RADIOLOGY REPORT*  Clinical Data:  Nausea and fever.  COMPLETE ABDOMINAL ULTRASOUND  Comparison:  [place] 06/28/2013.  Findings:  Gallbladder:  New gallbladder wall thickening, 8 mm in maximal thickness, with mild striated appearance.  There is layering sludge and gallstones.  Noted gallstone in the neck of the gallbladder appears fixed. No sonographic Murphy's sign.  Common bile duct:  8 mm diameter.  There is also intrahepatic biliary ductal dilatation with multiple bright spectral reflectors that shadow.  This is consistent with pneumobilia in a patient status post sphincterotomy. Dilation is less than prior.  Twinkle artifact within the CBD, without bright reflector.  Liver:  No focal lesion identified.  Within normal limits in parenchymal echogenicity.  IVC:  Normal where seen.  Pancreas:  Normal where seen.  No pancreatic ductal dilatation.  Spleen:  Measures 10 cm.  Right Kidney:  Diffuse cortical thinning with a 10 cm length.  No hydronephrosis.  Left Kidney:  Diffuse cortical thinning within a 10 cm  length.  No hydronephrosis.  Abdominal aorta:  No aneurysm where seen.  Maximal diameter proximally, 3.1 cm.  IMPRESSION: 1.  Cholelithiasis and new gallbladder wall thickening.  Although there is no sonographic Murphy's sign, findings could represent acute cholecystitis.  Consider biliary scintigraphy if there is clinical uncertainty. 2.  From July 2014, improved biliary ductal dilatation status post sphincterotomy and CBD stone extraction.  Pneumobilia decreases sensitivity for detecting current choledocholithiasis.   Original Report Authenticated By: Tiburcio Pea   Dg Chest Port 1 View  08/09/2013   *RADIOLOGY REPORT*  Clinical Data: Fever, weakness  PORTABLE CHEST - 1 VIEW  Comparison: Prior chest x-ray 07/04/2013  Findings: Stable cardiomegaly with left ventricular prominence. Atherosclerotic calcifications noted in the transverse aorta. Inspiratory volumes remain low.  There are bibasilar opacities which are similar compared to prior.  Surgical changes suggest prior thoracotomy with partial resection of the right fifth rib. No definite new airspace consolidation, pneumothorax or pulmonary edema.  Similar appearance of blunting of the right costophrenic angle which may reflect a small pleural effusion.  No acute osseous abnormality.  IMPRESSION:  Essentially stable chest x-ray without evidence of new acute cardiopulmonary process.  Stable pleural thickening versus small right pleural effusion.   Original Report Authenticated By: Malachy Moan, M.D.    Scheduled Meds: . lip balm  1 application Topical BID  . pantoprazole  40 mg Oral Daily  . piperacillin-tazobactam (ZOSYN)  IV  3.375 g Intravenous Q8H  . polyethylene glycol  17 g Oral BID  . sodium chloride  3 mL Intravenous Q12H  . vancomycin  1,000 mg Intravenous QHS   Continuous Infusions: . dextrose 5 % and 0.9% NaCl 75 mL/hr at 08/10/13 2000

## 2013-08-11 NOTE — Progress Notes (Signed)
CRITICAL VALUE ALERT  Critical value received:  positive blood culture of gram + cocci in clusters  Date of notification:  08/11/13  Time of notification:  0109  Critical value read back:yes  Nurse who received alert:  Jeannett Senior  MD notified (1st page):  Lenny Pastel, Midlevel  Time of first page:  0109  MD notified (2nd page):  Time of second page:  Responding MD:  Lenny Pastel, Midlevel  Time MD responded:  [phone]

## 2013-08-11 NOTE — Procedures (Signed)
Successful US and fluoroscopic guided placement of 10 Fr cholecystotomy tube.  No immediate complications. 

## 2013-08-12 LAB — PROTIME-INR: INR: 1.21 (ref 0.00–1.49)

## 2013-08-12 MED ORDER — HEPARIN (PORCINE) IN NACL 100-0.45 UNIT/ML-% IJ SOLN
1000.0000 [IU]/h | INTRAMUSCULAR | Status: DC
  Administered 2013-08-12: 14:00:00 1000 [IU]/h via INTRAVENOUS
  Filled 2013-08-12 (×2): qty 250

## 2013-08-12 MED ORDER — ENSURE COMPLETE PO LIQD
237.0000 mL | Freq: Two times a day (BID) | ORAL | Status: DC
  Administered 2013-08-12 – 2013-08-13 (×3): 237 mL via ORAL

## 2013-08-12 MED ORDER — WARFARIN SODIUM 3 MG PO TABS
3.0000 mg | ORAL_TABLET | Freq: Once | ORAL | Status: AC
  Administered 2013-08-12: 3 mg via ORAL
  Filled 2013-08-12: qty 1

## 2013-08-12 MED ORDER — WARFARIN - PHARMACIST DOSING INPATIENT: Freq: Every day | Status: DC

## 2013-08-12 NOTE — Progress Notes (Addendum)
Subjective:  Doing well. Eating today. No SOB,  noCP  Objective:  Vital Signs in the last 24 hours: Temp:  [97.4 F (36.3 C)-100 F (37.8 C)] 97.6 F (36.4 C) (09/07 0600) Pulse Rate:  [60-90] 60 (09/07 0600) Resp:  [17-21] 18 (09/07 0600) BP: (123-167)/(58-85) 123/58 mmHg (09/07 0600) SpO2:  [95 %-100 %] 100 % (09/07 0600)  Intake/Output from previous day: 09/06 0701 - 09/07 0700 In: 3395 [P.O.:120; I.V.:2775; IV Piggyback:500] Out: 970 [Urine:600; Drains:370]   Physical Exam: General: Elderly Well developed, well nourished, in no acute distress. Head:  Normocephalic and atraumatic. Lungs: Clear to auscultation and percussion. Heart: Normal S1 and S2.  2/6 S murmur, no rubs or gallops.  Abdomen: soft, non-tender, positive bowel sounds. Perc tube Extremities: No clubbing or cyanosis. No edema. Neurologic: Alert and oriented x 3.    Lab Results:  Recent Labs  08/10/13 0805 08/11/13 0405  WBC 19.3* 18.7*  HGB 10.8* 10.1*  PLT 264 300    Recent Labs  08/10/13 0805 08/11/13 0405  NA 133* 133*  K 3.0* 3.6  CL 99 103  CO2 24 23  GLUCOSE 91 136*  BUN 43* 32*  CREATININE 1.31 1.23   No results found for this basename: TROPONINI, CK, MB,  in the last 72 hours Hepatic Function Panel  Recent Labs  08/10/13 0805  PROT 5.2*  ALBUMIN 1.8*  AST 15  ALT 15  ALKPHOS 194*  BILITOT 0.8   No results found for this basename: CHOL,  in the last 72 hours No results found for this basename: PROTIME,  in the last 72 hours  Imaging: Ir Perc Cholecystostomy  08/11/2013   *RADIOLOGY REPORT*  Indication: Acute on chronic cholecystitis, poor operative candidate.  ULTRASOUND AND FLUOROSCOPIC-GUIDED CHOLECYSTOSTOMY TUBE PLACEMENT  Comparisons: Abdominal ultrasound - 08/09/2013; MRCP - 06/29/2013; CT of the pelvis - 06/29/2013  Medications: Fentanyl 50 mcg IV; Versed 0.5 mg IV; The patient is currently admitted to the hospital and on intravenous antibiotics. Antibiotics were  administered with an appropriate time frame prior to skin puncture.  Contrast: 10 ml Omnipaque-300 administered into the gallbladder fossa  Total Moderate Sedation Time: 15 minutes  Fluoroscopy Time: 48 seconds.  Complications: None immediate  Technique / Findings:  Informed written consent was obtained from the patient and the patient's son after a discussion of the risks, benefits and alternatives to treatment.  Questions regarding the procedure were encouraged and answered.  A timeout was performed prior to the initiation of the procedure.  Exhaustive preprocedural ultrasound scanning was performed however failed to delineate an adequate trans hepatic approach to the gallbladder secondary to the low lying position of the gallbladder as well as the interposed colon and small bowel as was demonstrated on previous cross-sectional imaging.  As such, a direct approach to the gallbladder was necessary for cholecystostomy access.  The right upper abdominal quadrant was prepped and draped in the usual sterile fashion, and a sterile drape was applied covering the operative field.  Maximum barrier sterile technique with sterile gowns and gloves were used for the procedure.  A timeout was performed prior to the initiation of the procedure.  Local anesthesia was provided with 1% lidocaine with epinephrine.  Again, utilized a direct approach, a 22 gauge needle was advanced into the gallbladder under direct ultrasound guidance.  An ultrasound image was saved for documentation purposes.  Appropriate intraluminal puncture was confirmed with the efflux of purulent bile and advancement of an 0.018 wire into the gallbladder lumen.  The needle was exchanged for an Accustick set.  A small amount of contrast was injected to confirm appropriate intraluminal positioning.  Over a Benson wire, a 10.2-French Cook cholecystomy tube was advanced into the gallbladder fossa, coiled and locked.  A small amount of purulent bile was aspirated with  the sample cap and sent to the laboratory for analysis.  A small amount of contrast was injected as several post procedural spot radiographic images were obtained in various obliquities.  The catheter was secured to the skin with suture and a Stat lock device, connected to a drainage bag and a dressing was placed.  The patient tolerated the procedure well without immediate postprocedural complication.  Impression:  1.  Successful ultrasound and fluoroscopic guided placement of a 10.2 French cholecystostomy tube. Again, a direct approach to the gallbladder was necessary for cholecystostomy placement secondary to lack of adequate sonographic window for transhepatic approach as well as the interposed colon and small bowel as was demonstrated on previously performed cross-sectional imaging.  2.  A small sample of the aspirated purulent appearing bile was sent to the laboratory for analysis.   Original Report Authenticated By: Tacey Ruiz, MD   Personally viewed.   Telemetry: No adverse rhythms Personally viewed.    Cardiac Studies:  Normal EF, mild AS, basal inferior hypokinesis  Assessment/Plan:  Principal Problem:   Acute cholecystitis Active Problems:   HYPERTENSION   GERD   CVA (cerebral vascular accident)   Long term current use of anticoagulant   Protein-calorie malnutrition, severe   Atrial fibrillation   Acute respiratory failure with hypoxia   Hypokalemia   Leukocytosis, unspecified   Anemia of chronic disease   Hyponatremia   Acute renal failure   Fever, unspecified   Gram-positive cocci in clusters  79 year old CAD, CVA on coumadin since 1999, prior WCT, PAF.   - Doing well post op  - No beta blocker due to prior bradycardia  - Prior stroke history CHADS - 4. Anticoagulation.   - Primary cards [facility]   Will sign off. Please call if questions.  SKAINS, MARK 08/12/2013, 8:36 AM

## 2013-08-12 NOTE — Evaluation (Signed)
Physical Therapy Evaluation Patient Details Name: [name] MRN: [id number] DOB: 05-10-1919 Today's Date: 08/12/2013 Time: 2952-8413 PT Time Calculation (min): 39 min  PT Assessment / Plan / Recommendation History of Present Illness   Pt admitted 08/09/13 with abdominal pain,acute cholecystitis, S/P drain. Pt has h/o stroke with R hemiparesis  Clinical Impression   Pt assisted from bed to recliner. Pt noted to strangle when drinking from straw. Pt reports that he uses thickener PTA. RN notified. Pt will benefit from PT while in acute care to improve in functional mobility and safe transfers to eventually return to ALF. Recommend SNF level.  PT Assessment  Patient needs continued PT services    Follow Up Recommendations  SNF    Does the patient have the potential to tolerate intense rehabilitation      Barriers to Discharge Decreased caregiver support      Equipment Recommendations  None recommended by PT    Recommendations for Other Services OT consult   Frequency Min 3X/week    Precautions / Restrictions Precautions Precautions: Fall Precaution Comments: pt noted to strangle on liquids. Pt reports that he uses thickener    Pertinent Vitals/Pain none      Mobility  Bed Mobility Bed Mobility: Supine to Sit;Sitting - Scoot to Edge of Bed Supine to Sit: 1: +2 Total assist Supine to Sit: Patient Percentage: 40% Sitting - Scoot to Edge of Bed: 2: Max assist Details for Bed Mobility Assistance: lifting assistance for trunk and to get RLe over edge. Transfers Transfers: Sit to Stand;Stand to Sit;Stand Pivot Transfers Sit to Stand: 1: +2 Total assist;From bed;From elevated surface Sit to Stand: Patient Percentage: 60% Stand to Sit: 1: +2 Total assist;To chair/3-in-1;With upper extremity assist Stand to Sit: Patient Percentage: 60% Stand Pivot Transfers: 1: +2 Total assist Stand Pivot Transfers: Patient Percentage: 60% Details for Transfer Assistance: multimodal cues for  safety, extra time to grasp RW and to advance RLE during pivot to [facility] then to recliner. Ambulation/Gait Ambulation/Gait Assistance: Not tested (comment) Assistive device: Rolling walker    Exercises     PT Diagnosis:    PT Problem List: Decreased strength;Decreased range of motion;Decreased activity tolerance;Decreased balance;Decreased mobility;Decreased knowledge of precautions;Decreased safety awareness;Decreased knowledge of use of DME PT Treatment Interventions: DME instruction;Gait training;Functional mobility training;Therapeutic activities;Therapeutic exercise;Patient/family education     PT Goals(Current goals can be found in the care plan section) Acute Rehab PT Goals Patient Stated Goal: I want to get up out of this bed. PT Goal Formulation: With patient Time For Goal Achievement: 08/26/13 Potential to Achieve Goals: Good  Visit Information  Last PT Received On: 08/12/13 Assistance Needed: +2 History of Present Illness: cholecystitis, S/P drain. Pt has h/o stroke with R hemiparesis       Prior Functioning  Home Living Family/patient expects to be discharged to:: Skilled nursing facility Home Equipment: Electric scooter;Wheelchair - manual;Cane - single point Prior Function Level of Independence: Needs assistance Gait / Transfers Assistance Needed: reports he occasionally needs assistance with transfers to chair, does not usually ambulate unless has increased assist, uses cane for transfers Communication Communication: [facility]    Cognition  Cognition Arousal/Alertness: Awake/alert Behavior During Therapy: WFL for tasks assessed/performed Overall Cognitive Status: Within Functional Limits for tasks assessed    Extremity/Trunk Assessment Upper Extremity Assessment Upper Extremity Assessment: RUE deficits/detail RUE Deficits / Details: noted contractures of hand and elbow but able to rasp RW with slow deliberate efort. Lower Extremity Assessment Lower Extremity  Assessment: Generalized weakness;RLE deficits/detail RLE  Deficits / Details: noted foot drop, able to bear weight through leg for transfers.   Balance Balance Balance Assessed: Yes Static Sitting Balance Static Sitting - Balance Support: Bilateral upper extremity supported Static Sitting - Level of Assistance: 3: Mod assist;4: Min assist  End of Session PT - End of Session Equipment Utilized During Treatment: Gait belt Activity Tolerance: Patient tolerated treatment well Patient left: in chair;with call bell/phone within reach;with chair alarm set Nurse Communication: Mobility status  GP     Rada Hay 08/12/2013, 12:17 PM Blanchard Kelch PT [phone]

## 2013-08-12 NOTE — Progress Notes (Signed)
Chart review.  Noted that PT recommends SNF.  Met with Pt to discuss PT recommendations.  Pt in agreement and stated that he was already at [facility], also know as [facility].  Pt stated that he's been there for a period of time.  Prior to that, he was a [facility] [place].  Pt ok to return to [facility] SNF.  Weekday CSW to follow.  Providence Crosby, LCSWA Clinical Social Work [phone]

## 2013-08-12 NOTE — Progress Notes (Signed)
[facility] PROGRESS NOTE  [name] ZOX:[id number] DOB: 04-19-1919 DOA: 08/09/2013 PCP: [name], MD  Brief narrative: 79 -year-old male with multiple medical comorbidities including but not limited to atrial fibrillation on anticoagulation with Coumadin, prior history of stroke, hypertension, prior history of skin cancer, obstructive sleep apnea on CPAP, admitted recently for choledocholithiasis and cholecystitis, s/p ERCP with splinterotomy and stone removalwho presented to [facility] ED from assisted living facility with confusion and fever. Patient was ultimately found to have acute cholecystitis.  In ED, vitals were stable with blood pressure of 100/60, heart rate 64, T max 101.2 F and O2 saturation of 85% on room air. Chest x-ray showed stable findings with no evidence of acute cardiopulmonary process. Abdominal ultrasound showed cholelithiasis and new gallbladder wall thickening, findings concerning for possible acute cholecystitis. CBC revealed white blood cell count of 23 and hemoglobin of 12.7. BMP revealed a sodium of 128, potassium of 3.3 and creatinine of 1.64. INR was 2.16, therapeutic. INR subsequently reversed with vitamin K for biliary drain placement. In addition, blood cultures growing gram positive cocci in clusters for which reason pt was started on vancomycin.   Assessment/Plan:   Principal Problem:  Acute cholecystitis  - Appreciate surgery consult and recommendations, percutaneous biliary drain placed 08/11/2013  - continue vanco and zosyn for now - continue IV fluids; watch for fluid overload - Continue pain management with fentanyl 25-50 mcg every 1 hour as needed IV for severe pain.  - diet as tolerated Active Problems:  Leukocytosis, unspecified  - Secondary to acute cholecystitis  - White blood cell count trending down  Fever  - likely sepsis due to acute cholecystitis  - now on vanco and zosyn; vanco added due to gram positive cocci in cluster in blood  cultures; final blood culture results still pending - repeat blood cultures today to ensure clearing of bacteremia Acute respiratory failure with hypoxia  - Perhaps related to severe pain  - Patient is saturating 100% on room air HYPERTENSION  - Blood pressure is 123/58 - Not on antihypertensive medications at this time  Atrial fibrillation  - restart coumadin, bridge with heparin - CHADS 4 - INR was reversed for biliary drain placement  - prior 2 D ECHO in 06/2013 with EF of 55%  - cardiology initially consulted for medical clearance prior to biliary drain placement  CVA (cerebral vascular accident)  - continue AC Hypokalemia  - Repleted  - potassium WNL  Hyponatremia  - Secondary to dehydration  - Continue IV fluids  Acute renal failure  - Secondary to pre-renal etiology, dehydration  - Creatinine is now within normal limits  Anemia of chronic disease  - Secondary to prior history of skin cancer  - Hemoglobin 10.8 --> 10.1  GERD  - Continue protonix  Protein-calorie malnutrition, severe  - Nutrition consulted  - diet as tolerated   Code Status: DNR/DNI  Family Communication: no family at the bedside; left message to pt son and left my contact information  Disposition Plan: home when stable; needs PT evaluation  [name], MD  [facility]  Pager [phone]   Consultants:  Surgery  Cardiology Interventional radiolgy Procedures:  Percutaneous biliary drainage 08/11/2013 Antibiotics:  Zosyn 08/10/2013 -->  Vancomycin 08/11/2013 -->   If 7PM-7AM, please contact night-coverage [email or web address] Password [email or web address] 08/12/2013, 6:54 AM   LOS: 3 days   HPI/Subjective: No overnight events.  Objective: Filed Vitals:   08/11/13 1419 08/11/13 1841 08/11/13 2050 08/11/13 2200  BP: 155/83 142/68  129/62  Pulse: 73 88  72  Temp: 99.5 F (37.5 C) 100 F (37.8 C)  98.3 F (36.8 C)  TempSrc: Oral Oral  Oral  Resp: 19 18 20 18   Height:      Weight:      SpO2: 99% 95%   95%    Intake/Output Summary (Last 24 hours) at 08/12/13 0654 Last data filed at 08/11/13 2238  Gross per 24 hour  Intake    120 ml  Output    920 ml  Net   -800 ml    Exam:   General:  Pt is alert, , not in acute distress  Cardiovascular: irregular rhythm, rate controlled, S1/S2 appreciated  Respiratory: Clear to auscultation bilaterally, no wheezing, no crackles, no rhonchi  Abdomen: Soft, non tender, biliary drain in place; non distended, bowel sounds present, no guarding  Extremities: No edema, pulses DP and PT palpable bilaterally  Neuro: Grossly nonfocal  Data Reviewed: Basic Metabolic Panel:  Recent Labs Lab 08/09/13 1749 08/10/13 0805 08/11/13 0405  NA 128* 133* 133*  K 3.3* 3.0* 3.6  CL 90* 99 103  CO2 25 24 23   GLUCOSE 109* 91 136*  BUN 56* 43* 32*  CREATININE 1.64* 1.31 1.23  CALCIUM 8.5 7.8* 7.8*   Liver Function Tests:  Recent Labs Lab 08/09/13 1749 08/10/13 0805  AST 28 15  ALT 22 15  ALKPHOS 259* 194*  BILITOT 1.0 0.8  PROT 6.6 5.2*  ALBUMIN 2.5* 1.8*    Recent Labs Lab 08/09/13 1805  LIPASE 13   No results found for this basename: AMMONIA,  in the last 168 hours CBC:  Recent Labs Lab 08/09/13 1749 08/10/13 0805 08/11/13 0405  WBC 23.0* 19.3* 18.7*  NEUTROABS 20.3*  --   --   HGB 12.7* 10.8* 10.1*  HCT 38.6* 32.9* 31.0*  MCV 88.3 89.2 90.1  PLT 280 264 300   Cardiac Enzymes: No results found for this basename: CKTOTAL, CKMB, CKMBINDEX, TROPONINI,  in the last 168 hours BNP: No components found with this basename: POCBNP,  CBG: No results found for this basename: GLUCAP,  in the last 168 hours  CULTURE, BLOOD (ROUTINE X 2)     Status: None   Collection Time    08/09/13  6:50 PM      Result Value Range Status   Specimen Description BLOOD LEFT ANTECUBITAL   Final   Special Requests     Final   Value: BOTTLES DRAWN AEROBIC AND ANAEROBIC NO VOLUME INDICATED   Culture  Setup Time     Final   Value: 08/10/2013 03:15      Performed at [facility]   Culture     Final   Value:        BLOOD CULTURE RECEIVED NO GROWTH TO DATE CULTURE WILL BE HELD FOR 5 DAYS BEFORE ISSUING A FINAL NEGATIVE REPORT     Performed at [facility]   Report Status PENDING   Incomplete  CULTURE, BLOOD (ROUTINE X 2)     Status: None   Collection Time    08/09/13  7:10 PM      Result Value Range Status   Specimen Description BLOOD L HAND   Final   Special Requests A BOTTLES DRAWN AEROBIC ONLY 3CC   Final   Culture  Setup Time     Final   Value: 08/10/2013 03:15     Performed at [facility]   Culture     Final   Value:  GRAM POSITIVE COCCI IN CLUSTERS     0107 Note: Gram Stain Report Called to,Read Back By and Verified With: Jeannett Senior 30-Aug-2013 [facility]     Performed at [facility]   Report Status PENDING   Incomplete  MRSA PCR SCREENING     Status: None   Collection Time    08/10/13  1:12 AM      Result Value Range Status   MRSA by PCR NEGATIVE  NEGATIVE Final  URINE CULTURE     Status: None   Collection Time    08/10/13  4:12 AM      Result Value Range Status   Specimen Description URINE, CLEAN CATCH   Final   Value: NO GROWTH     Performed at [facility]   Culture     Final   Value: NO GROWTH     Performed at [facility]   Report Status 30-Aug-2013 FINAL   Final     Studies: Ir Perc Cholecystostomy 08-30-2013   * Impression:  1.  Successful ultrasound and fluoroscopic guided placement of a 10.2 French cholecystostomy tube. Again, a direct approach to the gallbladder was necessary for cholecystostomy placement secondary to lack of adequate sonographic window for transhepatic approach as well as the interposed colon and small bowel as was demonstrated on previously performed cross-sectional imaging.  2.  A small sample of the aspirated purulent appearing bile was sent to the laboratory for analysis.   Original Report Authenticated By: Tacey Ruiz, MD    Scheduled  Meds: . lip balm  1 application Topical BID  . pantoprazole  40 mg Oral Daily  . piperacillin-tazobactam (ZOSYN)  IV  3.375 g Intravenous Q8H  . polyethylene glycol  17 g Oral BID  . sodium chloride  3 mL Intravenous Q12H  . vancomycin  1,000 mg Intravenous QHS   Continuous Infusions: . dextrose 5 % and 0.9% NaCl 75 mL/hr at 08/12/13 0211

## 2013-08-12 NOTE — Progress Notes (Signed)
Subjective: Pt feeling better this am. Was able to eat regular breakfast. Reports minimal pain.  Objective: Physical Exam: BP 123/58  Pulse 60  Temp(Src) 97.6 F (36.4 C) (Axillary)  Resp 18  Ht 5\' 3"  (1.6 m)  Wt 150 lb 12.7 oz (68.4 kg)  BMI 26.72 kg/m2  SpO2 100% RUQ drain intact, site clean, NT Output purulent    Labs: CBC  Recent Labs  08/10/13 0805 08/11/13 0405  WBC 19.3* 18.7*  HGB 10.8* 10.1*  HCT 32.9* 31.0*  PLT 264 300   BMET  Recent Labs  08/10/13 0805 08/11/13 0405  NA 133* 133*  K 3.0* 3.6  CL 99 103  CO2 24 23  GLUCOSE 91 136*  BUN 43* 32*  CREATININE 1.31 1.23  CALCIUM 7.8* 7.8*   LFT  Recent Labs  08/09/13 1805 08/10/13 0805  PROT  --  5.2*  ALBUMIN  --  1.8*  AST  --  15  ALT  --  15  ALKPHOS  --  194*  BILITOT  --  0.8  LIPASE 13  --    PT/INR  Recent Labs  08/11/13 0405 08/12/13 0520  LABPROT 15.1 15.0  INR 1.22 1.21     Studies/Results: Ir Perc Cholecystostomy  08/11/2013   *RADIOLOGY REPORT*  Indication: Acute on chronic cholecystitis, poor operative candidate.  ULTRASOUND AND FLUOROSCOPIC-GUIDED CHOLECYSTOSTOMY TUBE PLACEMENT  Comparisons: Abdominal ultrasound - 08/09/2013; MRCP - 06/29/2013; CT of the pelvis - 06/29/2013  Medications: Fentanyl 50 mcg IV; Versed 0.5 mg IV; The patient is currently admitted to the hospital and on intravenous antibiotics. Antibiotics were administered with an appropriate time frame prior to skin puncture.  Contrast: 10 ml Omnipaque-300 administered into the gallbladder fossa  Total Moderate Sedation Time: 15 minutes  Fluoroscopy Time: 48 seconds.  Complications: None immediate  Technique / Findings:  Informed written consent was obtained from the patient and the patient's son after a discussion of the risks, benefits and alternatives to treatment.  Questions regarding the procedure were encouraged and answered.  A timeout was performed prior to the initiation of the procedure.  Exhaustive  preprocedural ultrasound scanning was performed however failed to delineate an adequate trans hepatic approach to the gallbladder secondary to the low lying position of the gallbladder as well as the interposed colon and small bowel as was demonstrated on previous cross-sectional imaging.  As such, a direct approach to the gallbladder was necessary for cholecystostomy access.  The right upper abdominal quadrant was prepped and draped in the usual sterile fashion, and a sterile drape was applied covering the operative field.  Maximum barrier sterile technique with sterile gowns and gloves were used for the procedure.  A timeout was performed prior to the initiation of the procedure.  Local anesthesia was provided with 1% lidocaine with epinephrine.  Again, utilized a direct approach, a 22 gauge needle was advanced into the gallbladder under direct ultrasound guidance.  An ultrasound image was saved for documentation purposes.  Appropriate intraluminal puncture was confirmed with the efflux of purulent bile and advancement of an 0.018 wire into the gallbladder lumen. The needle was exchanged for an Accustick set.  A small amount of contrast was injected to confirm appropriate intraluminal positioning.  Over a Benson wire, a 10.2-French Cook cholecystomy tube was advanced into the gallbladder fossa, coiled and locked.  A small amount of purulent bile was aspirated with the sample cap and sent to the laboratory for analysis.  A small amount of contrast was injected  as several post procedural spot radiographic images were obtained in various obliquities.  The catheter was secured to the skin with suture and a Stat lock device, connected to a drainage bag and a dressing was placed.  The patient tolerated the procedure well without immediate postprocedural complication.  Impression:  1.  Successful ultrasound and fluoroscopic guided placement of a 10.2 French cholecystostomy tube. Again, a direct approach to the  gallbladder was necessary for cholecystostomy placement secondary to lack of adequate sonographic window for transhepatic approach as well as the interposed colon and small bowel as was demonstrated on previously performed cross-sectional imaging.  2.  A small sample of the aspirated purulent appearing bile was sent to the laboratory for analysis.   Original Report Authenticated By: Tacey Ruiz, MD    Assessment/Plan: S/p perc chole drain for acute on chronic cholecystitis Check final cultures. Drain flushes as ordered. Agree with surgery that drain will need to remain a minimum of 6-8 weeks.    LOS: 3 days    Brayton El PA-C 08/12/2013 9:46 AM

## 2013-08-12 NOTE — Progress Notes (Signed)
ANTICOAGULATION CONSULT NOTE - Initial Consult  Pharmacy Consult for IV heparin, Coumadin Indication: h/o afib, CVA   Allergies  Allergen Reactions  . Sulfonamide Derivatives Rash    REACTION: rash    Patient Measurements: Height: 5\' 3"  (160 cm) Weight: 150 lb 12.7 oz (68.4 kg) IBW/kg (Calculated) : 56.9 Heparin Dosing Weight: 68.4kg  Vital Signs: Temp: 97.6 F (36.4 C) (09/07 0600) Temp src: Axillary (09/07 0600) BP: 123/58 mmHg (09/07 0600) Pulse Rate: 60 (09/07 0600)  Labs:  Recent Labs  08/09/13 1749  08/09/13 2305 08/10/13 0805 08/11/13 0405 08/12/13 0520  HGB 12.7*  --   --  10.8* 10.1*  --   HCT 38.6*  --   --  32.9* 31.0*  --   PLT 280  --   --  264 300  --   APTT  --   --  47*  --  39*  --   LABPROT  --   < > 23.4* 23.1* 15.1 15.0  INR  --   < > 2.16* 2.12* 1.22 1.21  CREATININE 1.64*  --   --  1.31 1.23  --   < > = values in this interval not displayed.  Estimated Creatinine Clearance: 34 ml/min (by C-G formula based on Cr of 1.23).   Medical History: Past Medical History  Diagnosis Date  . GERD (gastroesophageal reflux disease)   . Hypertension   . Hearing loss     wears hearing aid - right side  . Leg swelling   . Trouble swallowing   . Bruises easily     due to coumadin  . Weakness     difficulty walking  . Contact lens/glasses fitting   . Stroke 1999  . Cancer   . 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 Gait disturbance   . Dyslipidemia   . COPD (chronic obstructive pulmonary disease)   . Peripheral vascular disease   . History of melanoma   . Diverticulosis   . Abnormality of gait 05/16/2013  . Choledocholithiasis with obstruction 06/28/13  . Embolic stroke   . PAF (paroxysmal atrial fibrillation)   . Hyperlipidemia   . Bradycardia   . Protein-calorie malnutrition, severe   . Ventricular tachycardia (paroxysmal)   . Obstructive sleep apnea of adult     uses cpap, pt does not know setting  . Personal history of fall 05/29/2013  . Fracture of  femoral neck, right, closed 3114  . Long term (current) use of anticoagulants     PAF and embolic CVA  . Anemia, unspecified 07/05/2013  . Xerophthalmia 07/05/2013  . 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 Actinic keratosis   . Seborrheic keratosis     Assessment: 16 yoM, SNF resident on Coumadin PTA for h/o paroxysmal afib and CVA.  Coumadin was on hold since admission for IR perc cholecystostomy 9/6 given acute cholecystitis and cholelithiasis. Cardiology was on board and recommended continuing anticoagulation post-procedure given CHADS score of 4 and to bridge with IV heparin as needed. Patient is now s/p IR perc cholecystostomy and MD would like to start IV heparin bridge with Coumadin 24 hours post-procedure.  Of note, patient received Vitamin K 5mg  IV x 1 on 9/5. Per [facility] anti-coag notes: patient was on Coumadin 3mg  daily except 4.5mg  on Tue/Sat. This dose was verified with patient.   INR 1.21 today, Hgb 10.1 yesterday with plts wnl. No bleeding per RN (Tess).     Goal of Therapy:  Anti-Xa level 0.6-1.2 units/ml 4hrs after LMWH  dose given Monitor platelets by anticoagulation protocol: Yes   Plan:   At [facility] today, start IV heparin WITHOUT bolus and at a rate of 1000 units/hr (39ml/hr) per [place] [facility]  Coumadin 3 mg po x 1 tonight  Heparin level 2200 tonight   Daily heparin level, CBC and PT/INR  Pharmacy will f/u  Geoffry Paradise, PharmD, BCPS Pager: [phone] 9:48 AM Pharmacy #: [phone]

## 2013-08-12 NOTE — Progress Notes (Signed)
Subjective: Not having much pain.  Does not have much of an appetite.  Objective: Vital signs in last 24 hours: Temp:  [97.4 F (36.3 C)-100 F (37.8 C)] 97.6 F (36.4 C) (09/07 0600) Pulse Rate:  [60-90] 60 (09/07 0600) Resp:  [17-21] 18 (09/07 0600) BP: (123-167)/(58-85) 123/58 mmHg (09/07 0600) SpO2:  [95 %-100 %] 100 % (09/07 0600) Last BM Date: 08/11/13  Intake/Output from previous day: 09/06 0701 - 09/07 0700 In: 3395 [P.O.:120; I.V.:2775; IV Piggyback:500] Out: 970 [Urine:600; Drains:370] Intake/Output this shift:    PE: General- In NAD Abdomen-RUQ mass significant smaller and non tender, purulent fluid in drain  Lab Results:   Recent Labs  08/10/13 0805 08/11/13 0405  WBC 19.3* 18.7*  HGB 10.8* 10.1*  HCT 32.9* 31.0*  PLT 264 300   BMET  Recent Labs  08/10/13 0805 08/11/13 0405  NA 133* 133*  K 3.0* 3.6  CL 99 103  CO2 24 23  GLUCOSE 91 136*  BUN 43* 32*  CREATININE 1.31 1.23  CALCIUM 7.8* 7.8*   PT/INR  Recent Labs  08/11/13 0405 08/12/13 0520  LABPROT 15.1 15.0  INR 1.22 1.21   Comprehensive Metabolic Panel:    Component Value Date/Time   NA 133* 08/11/2013 0405   K 3.6 08/11/2013 0405   CL 103 08/11/2013 0405   CO2 23 08/11/2013 0405   BUN 32* 08/11/2013 0405   CREATININE 1.23 08/11/2013 0405   GLUCOSE 136* 08/11/2013 0405   CALCIUM 7.8* 08/11/2013 0405   AST 15 08/10/2013 0805   ALT 15 08/10/2013 0805   ALKPHOS 194* 08/10/2013 0805   BILITOT 0.8 08/10/2013 0805   PROT 5.2* 08/10/2013 0805   ALBUMIN 1.8* 08/10/2013 0805     Studies/Results: Ir Perc Cholecystostomy  08/11/2013   *RADIOLOGY REPORT*  Indication: Acute on chronic cholecystitis, poor operative candidate.  ULTRASOUND AND FLUOROSCOPIC-GUIDED CHOLECYSTOSTOMY TUBE PLACEMENT  Comparisons: Abdominal ultrasound - 08/09/2013; MRCP - 06/29/2013; CT of the pelvis - 06/29/2013  Medications: Fentanyl 50 mcg IV; Versed 0.5 mg IV; The patient is currently admitted to the hospital and on intravenous  antibiotics. Antibiotics were administered with an appropriate time frame prior to skin puncture.  Contrast: 10 ml Omnipaque-300 administered into the gallbladder fossa  Total Moderate Sedation Time: 15 minutes  Fluoroscopy Time: 48 seconds.  Complications: None immediate  Technique / Findings:  Informed written consent was obtained from the patient and the patient's son after a discussion of the risks, benefits and alternatives to treatment.  Questions regarding the procedure were encouraged and answered.  A timeout was performed prior to the initiation of the procedure.  Exhaustive preprocedural ultrasound scanning was performed however failed to delineate an adequate trans hepatic approach to the gallbladder secondary to the low lying position of the gallbladder as well as the interposed colon and small bowel as was demonstrated on previous cross-sectional imaging.  As such, a direct approach to the gallbladder was necessary for cholecystostomy access.  The right upper abdominal quadrant was prepped and draped in the usual sterile fashion, and a sterile drape was applied covering the operative field.  Maximum barrier sterile technique with sterile gowns and gloves were used for the procedure.  A timeout was performed prior to the initiation of the procedure.  Local anesthesia was provided with 1% lidocaine with epinephrine.  Again, utilized a direct approach, a 22 gauge needle was advanced into the gallbladder under direct ultrasound guidance.  An ultrasound image was saved for documentation purposes.  Appropriate intraluminal  puncture was confirmed with the efflux of purulent bile and advancement of an 0.018 wire into the gallbladder lumen. The needle was exchanged for an Accustick set.  A small amount of contrast was injected to confirm appropriate intraluminal positioning.  Over a Benson wire, a 10.2-French Cook cholecystomy tube was advanced into the gallbladder fossa, coiled and locked.  A small amount of  purulent bile was aspirated with the sample cap and sent to the laboratory for analysis.  A small amount of contrast was injected as several post procedural spot radiographic images were obtained in various obliquities.  The catheter was secured to the skin with suture and a Stat lock device, connected to a drainage bag and a dressing was placed.  The patient tolerated the procedure well without immediate postprocedural complication.  Impression:  1.  Successful ultrasound and fluoroscopic guided placement of a 10.2 French cholecystostomy tube. Again, a direct approach to the gallbladder was necessary for cholecystostomy placement secondary to lack of adequate sonographic window for transhepatic approach as well as the interposed colon and small bowel as was demonstrated on previously performed cross-sectional imaging.  2.  A small sample of the aspirated purulent appearing bile was sent to the laboratory for analysis.   Original Report Authenticated By: Tacey Ruiz, MD    Anti-infectives: Anti-infectives   Start     Dose/Rate Route Frequency Ordered Stop   08/11/13 2200  vancomycin (VANCOCIN) IVPB 1000 mg/200 mL premix     1,000 mg 200 mL/hr over 60 Minutes Intravenous Daily at bedtime 08/11/13 0129     08/11/13 0130  vancomycin (VANCOCIN) IVPB 1000 mg/200 mL premix     1,000 mg 200 mL/hr over 60 Minutes Intravenous  Once 08/11/13 0119 08/11/13 0359   08/09/13 2200  piperacillin-tazobactam (ZOSYN) IVPB 3.375 g     3.375 g 12.5 mL/hr over 240 Minutes Intravenous 3 times per day 08/09/13 2122        Assessment Principal Problem:   Acute cholecystitis, cholelithiasis-s/p percutaneous cholecystostomy; looks better. Active Problems:   HYPERTENSION   GERD   CVA (cerebral vascular accident)   Long term current use of anticoagulant   Protein-calorie malnutrition, severe   Atrial fibrillation   Acute respiratory failure with hypoxia   Hypokalemia   Leukocytosis, unspecified   Anemia of  chronic disease   Hyponatremia   Acute renal failure   Fever, unspecified   Gram-positive cocci in clusters    LOS: 3 days   Plan:  Cholecystostomy tube will need to stay in 6-8 weeks.   Hollyn Stucky J 08/12/2013

## 2013-08-13 LAB — CBC
HCT: 35.2 % — ABNORMAL LOW (ref 39.0–52.0)
Hemoglobin: 11.3 g/dL — ABNORMAL LOW (ref 13.0–17.0)
MCH: 29.2 pg (ref 26.0–34.0)
MCV: 91 fL (ref 78.0–100.0)
RBC: 3.87 MIL/uL — ABNORMAL LOW (ref 4.22–5.81)
WBC: 11.2 10*3/uL — ABNORMAL HIGH (ref 4.0–10.5)

## 2013-08-13 LAB — CULTURE, BLOOD (ROUTINE X 2)

## 2013-08-13 LAB — BASIC METABOLIC PANEL
CO2: 24 mEq/L (ref 19–32)
Calcium: 8.2 mg/dL — ABNORMAL LOW (ref 8.4–10.5)
Chloride: 106 mEq/L (ref 96–112)
Creatinine, Ser: 1.09 mg/dL (ref 0.50–1.35)
Glucose, Bld: 98 mg/dL (ref 70–99)

## 2013-08-13 LAB — PROTIME-INR: Prothrombin Time: 15.7 seconds — ABNORMAL HIGH (ref 11.6–15.2)

## 2013-08-13 MED ORDER — ENOXAPARIN SODIUM 80 MG/0.8ML ~~LOC~~ SOLN
1.0000 mg/kg | Freq: Two times a day (BID) | SUBCUTANEOUS | Status: DC
  Filled 2013-08-13: qty 0.8

## 2013-08-13 MED ORDER — ENOXAPARIN SODIUM 60 MG/0.6ML ~~LOC~~ SOLN: 1.0000 mg/kg | Freq: Two times a day (BID) | SUBCUTANEOUS | Status: DC

## 2013-08-13 MED ORDER — POTASSIUM CHLORIDE CRYS ER 20 MEQ PO TBCR
40.0000 meq | EXTENDED_RELEASE_TABLET | Freq: Once | ORAL | Status: AC
  Administered 2013-08-13: 40 meq via ORAL
  Filled 2013-08-13 (×2): qty 2

## 2013-08-13 MED ORDER — HEPARIN (PORCINE) IN NACL 100-0.45 UNIT/ML-% IJ SOLN
1200.0000 [IU]/h | INTRAMUSCULAR | Status: DC
  Administered 2013-08-13: 06:00:00 1200 [IU]/h via INTRAVENOUS
  Filled 2013-08-13: qty 250

## 2013-08-13 MED ORDER — POTASSIUM CHLORIDE CRYS ER 20 MEQ PO TBCR
40.0000 meq | EXTENDED_RELEASE_TABLET | Freq: Once | ORAL | Status: AC
  Administered 2013-08-13: 14:00:00 40 meq via ORAL
  Filled 2013-08-13: qty 2

## 2013-08-13 MED ORDER — AMOXICILLIN-POT CLAVULANATE 875-125 MG PO TABS: 1.0000 | ORAL_TABLET | Freq: Two times a day (BID) | ORAL | Status: DC

## 2013-08-13 MED ORDER — BISACODYL 10 MG RE SUPP: 10.0000 mg | Freq: Two times a day (BID) | RECTAL | Status: DC | PRN

## 2013-08-13 MED ORDER — ENSURE COMPLETE PO LIQD: 237.0000 mL | Freq: Two times a day (BID) | ORAL | Status: DC

## 2013-08-13 MED ORDER — WARFARIN SODIUM 3 MG PO TABS: 3.0000 mg | ORAL_TABLET | Freq: Once | ORAL | Status: DC

## 2013-08-13 MED ORDER — POLYETHYLENE GLYCOL 3350 17 G PO PACK: 17.0000 g | PACK | Freq: Two times a day (BID) | ORAL | Status: DC

## 2013-08-13 MED ORDER — ENOXAPARIN SODIUM 80 MG/0.8ML ~~LOC~~ SOLN
1.0000 mg/kg | Freq: Once | SUBCUTANEOUS | Status: AC
  Administered 2013-08-13: 11:00:00 70 mg via SUBCUTANEOUS
  Filled 2013-08-13: qty 0.8

## 2013-08-13 MED ORDER — HYDROCODONE-ACETAMINOPHEN 5-325 MG PO TABS: 1.0000 | ORAL_TABLET | Freq: Four times a day (QID) | ORAL | Status: DC | PRN

## 2013-08-13 MED ORDER — ALUM & MAG HYDROXIDE-SIMETH 200-200-20 MG/5ML PO SUSP: 30.0000 mL | Freq: Four times a day (QID) | ORAL | Status: DC | PRN

## 2013-08-13 MED ORDER — WARFARIN SODIUM 3 MG PO TABS
3.0000 mg | ORAL_TABLET | Freq: Once | ORAL | Status: DC
  Filled 2013-08-13: qty 1

## 2013-08-13 NOTE — Progress Notes (Addendum)
Subjective: Pt resting quietly; denies acute changes; states he didn't sleep well last night; appetite fair  Objective: Vital signs in last 24 hours: Temp:  [97.4 F (36.3 C)-98.6 F (37 C)] 97.5 F (36.4 C) (09/08 0556) Pulse Rate:  [55-68] 63 (09/08 0556) Resp:  [18-20] 18 (09/08 0556) BP: (133-166)/(64-81) 154/64 mmHg (09/08 0556) SpO2:  [99 %-100 %] 100 % (09/08 0556) Last BM Date: 08/12/13  Intake/Output from previous day: 09/07 0701 - 09/08 0700 In: 3009.1 [P.O.:690; I.V.:1969.1; IV Piggyback:350] Out: 910 [Urine:850; Drains:60] Intake/Output this shift: Total I/O In: -  Out: 1 [Stool:1]  RUQ/GB drain intact, insertion site mildly tender, output 60 cc's ; cx's pend  Lab Results:   Recent Labs  08/11/13 0405 08/13/13 0425  WBC 18.7* 11.2*  HGB 10.1* 11.3*  HCT 31.0* 35.2*  PLT 300 311   BMET  Recent Labs  08/11/13 0405 08/13/13 0425  NA 133* 137  K 3.6 3.3*  CL 103 106  CO2 23 24  GLUCOSE 136* 98  BUN 32* 19  CREATININE 1.23 1.09  CALCIUM 7.8* 8.2*   PT/INR  Recent Labs  08/12/13 0520 08/13/13 0425  LABPROT 15.0 15.7*  INR 1.21 1.28   ABG No results found for this basename: PHART, PCO2, PO2, HCO3,  in the last 72 hours  Studies/Results: Ir Perc Cholecystostomy  08/11/2013   *RADIOLOGY REPORT*  Indication: Acute on chronic cholecystitis, poor operative candidate.  ULTRASOUND AND FLUOROSCOPIC-GUIDED CHOLECYSTOSTOMY TUBE PLACEMENT  Comparisons: Abdominal ultrasound - 08/09/2013; MRCP - 06/29/2013; CT of the pelvis - 06/29/2013  Medications: Fentanyl 50 mcg IV; Versed 0.5 mg IV; The patient is currently admitted to the hospital and on intravenous antibiotics. Antibiotics were administered with an appropriate time frame prior to skin puncture.  Contrast: 10 ml Omnipaque-300 administered into the gallbladder fossa  Total Moderate Sedation Time: 15 minutes  Fluoroscopy Time: 48 seconds.  Complications: None immediate  Technique / Findings:  Informed  written consent was obtained from the patient and the patient's son after a discussion of the risks, benefits and alternatives to treatment.  Questions regarding the procedure were encouraged and answered.  A timeout was performed prior to the initiation of the procedure.  Exhaustive preprocedural ultrasound scanning was performed however failed to delineate an adequate trans hepatic approach to the gallbladder secondary to the low lying position of the gallbladder as well as the interposed colon and small bowel as was demonstrated on previous cross-sectional imaging.  As such, a direct approach to the gallbladder was necessary for cholecystostomy access.  The right upper abdominal quadrant was prepped and draped in the usual sterile fashion, and a sterile drape was applied covering the operative field.  Maximum barrier sterile technique with sterile gowns and gloves were used for the procedure.  A timeout was performed prior to the initiation of the procedure.  Local anesthesia was provided with 1% lidocaine with epinephrine.  Again, utilized a direct approach, a 22 gauge needle was advanced into the gallbladder under direct ultrasound guidance.  An ultrasound image was saved for documentation purposes.  Appropriate intraluminal puncture was confirmed with the efflux of purulent bile and advancement of an 0.018 wire into the gallbladder lumen. The needle was exchanged for an Accustick set.  A small amount of contrast was injected to confirm appropriate intraluminal positioning.  Over a Benson wire, a 10.2-French Cook cholecystomy tube was advanced into the gallbladder fossa, coiled and locked.  A small amount of purulent bile was aspirated with the sample cap  and sent to the laboratory for analysis.  A small amount of contrast was injected as several post procedural spot radiographic images were obtained in various obliquities.  The catheter was secured to the skin with suture and a Stat lock device, connected to a  drainage bag and a dressing was placed.  The patient tolerated the procedure well without immediate postprocedural complication.  Impression:  1.  Successful ultrasound and fluoroscopic guided placement of a 10.2 French cholecystostomy tube. Again, a direct approach to the gallbladder was necessary for cholecystostomy placement secondary to lack of adequate sonographic window for transhepatic approach as well as the interposed colon and small bowel as was demonstrated on previously performed cross-sectional imaging.  2.  A small sample of the aspirated purulent appearing bile was sent to the laboratory for analysis.   Original Report Authenticated By: Tacey Ruiz, MD    Anti-infectives: Anti-infectives   Start     Dose/Rate Route Frequency Ordered Stop   08/11/13 2200  vancomycin (VANCOCIN) IVPB 1000 mg/200 mL premix     1,000 mg 200 mL/hr over 60 Minutes Intravenous Daily at bedtime 08/11/13 0129     08/11/13 0130  vancomycin (VANCOCIN) IVPB 1000 mg/200 mL premix     1,000 mg 200 mL/hr over 60 Minutes Intravenous  Once 08/11/13 0119 08/11/13 0359   08/09/13 2200  piperacillin-tazobactam (ZOSYN) IVPB 3.375 g     3.375 g 12.5 mL/hr over 240 Minutes Intravenous 3 times per day 08/09/13 2122        Assessment/Plan: s/p perc cholecystostomy 9/6; check bile cx's;cont drain irrigation; monitor labs, incl LFT's; replace K+; cont drain for at least 4-6 weeks to allow for tract maturation unless cholecystectomy done in interim  LOS: 4 days    ALLRED,D [facility] 08/13/2013

## 2013-08-13 NOTE — Discharge Summary (Addendum)
Physician Discharge Summary  [name]:[id number] DOB: 07-21-1919 DOA: 08/09/2013  PCP: [name], MD  Admit date: 08/09/2013 Discharge date: 08/13/2013  Recommendations for Outpatient Follow-up:  1. please note that you were hospitalized for acute cholecystitis/cholangitis. We have inserted biliary drain which per surgery needs to stay for at least 6 weeks prior to reevaluation for removal. Please note that you'll have to continue antibiotics, Augmentin for 2 weeks on discharge.  2. because of the procedure we had to reverse INR. You will continue taking Lovenox and Coumadin, until INR is 2-3 at which point you can discontinue Lovenox.  Dose for Lovenox is a 70 mg twice daily subcutaneously. Please use Coumadin 3 mg tonight and recheck INR on daily basis until it is 2-3. Then stop Lovenox.  3. potassium has been repleted prior to discharge, please repeat potassium level in one to 2 days to make sure it is within normal limits  4. white blood cell count has trended down from 19 to 11 prior to discharge  5. hemoglobin has been stable throughout the hospital stay at 11.3   Discharge Diagnoses:  Principal Problem:   Acute cholecystitis Active Problems:   Acute respiratory failure with hypoxia   Gram-positive cocci in clusters   HYPERTENSION   GERD   CVA (cerebral vascular accident)   Long term current use of anticoagulant   Protein-calorie malnutrition, severe   Atrial fibrillation   Hypokalemia   Leukocytosis, unspecified   Anemia of chronic disease   Hyponatremia   Acute renal failure   Fever, unspecified    Discharge Condition: medically stable for discharge to SNF   Diet recommendation: As tolerated  History of present illness:  79 -year-old male with multiple medical comorbidities including but not limited to atrial fibrillation on anticoagulation with Coumadin, prior history of stroke, hypertension, prior history of skin cancer, obstructive sleep apnea on CPAP,  admitted recently for choledocholithiasis and cholecystitis, s/p ERCP with splinterotomy and stone removalwho presented to [facility] from assisted living facility with confusion and fever. Patient was ultimately found to have acute cholecystitis.  In ED, vitals were stable with blood pressure of 100/60, heart rate 64, T max 101.2 F and O2 saturation of 85% on room air. Chest x-ray showed stable findings with no evidence of acute cardiopulmonary process. Abdominal ultrasound showed cholelithiasis and new gallbladder wall thickening, findings concerning for possible acute cholecystitis. CBC revealed white blood cell count of 23 and hemoglobin of 12.7. BMP revealed a sodium of 128, potassium of 3.3 and creatinine of 1.64. INR was 2.16, therapeutic. INR subsequently reversed with vitamin K for biliary drain placement.  Assessment/Plan:   Principal Problem:  Acute cholecystitis  - Appreciate surgery consult and recommendations, percutaneous biliary drain placed 08/11/2013 and and no subsequent complications. I spoke with infectious disease on call, Dr. Orvan Falconer who recommended continuing Augmentin for 2 weeks on discharge - Vancomycin and Zosyn and now discontinued - Patient tolerates diet very well  - Blood cultures showed coagulase-negative species likely contamination   Active Problems:  Leukocytosis, unspecified  - Secondary to acute cholecystitis  - White blood cell count trending down to 11.2 prior to discharge Fever  - likely sepsis due to acute cholecystitis  - Was on vancomycin and Zosyn. Patient remains afebrile since the admission. Blood cultures showed coagulase-negative species is still contamination rather than a true infection. - We will continue Augmentin for 2 weeks on discharge Acute respiratory failure with hypoxia  - Perhaps related to pain. Respiratory status  remains stable for past 72 hours. - Patient is saturating 100% on room air  HYPERTENSION  - Not on antihypertensive  medications at this time patient may resume Lasix every other day which will help with blood pressure control Atrial fibrillation  - restart coumadin, bridge with Lovenox. Use Lovenox 70 mg twice daily. Check INR daily until goal is reached 2-3. Then stop Lovenox - CHADS 4  - prior 2 D ECHO in 06/2013 with EF of 55%  - cardiology initially consulted for medical clearance prior to biliary drain placement  CVA (cerebral vascular accident)  - continue AC  Hypokalemia  - Repleted  - Check potassium level in one to 2 days to make sure it is within normal limits  Hyponatremia  - Secondary to dehydration  - Sodium within normal limits  Acute renal failure  - Secondary to pre-renal etiology, dehydration  - Creatinine is now within normal limits  Anemia of chronic disease  - Secondary to prior history of skin cancer  - Hemoglobin  stable at 11.3  GERD  - Continue protonix  Protein-calorie malnutrition, severe  - Nutrition consulted  - Diet as tolerated   Code Status: DNR/DNI  Family Communication: no family at the bedside; left message to pt son about the discharge for today  [name] Passey, MD  [facility]  Pager [phone]   Consultants:  Surgery  Cardiology  Interventional radiolgy Procedures:  Percutaneous biliary drainage 08/11/2013 Antibiotics:  Zosyn 08/10/2013 --> 08/13/13 Vancomycin 08/11/2013 --> 08/13/13 Augmentin for 2 weeks on discharge    Signed:  [name], MD  [facility] 08/13/2013, 1:19 PM  Pager #: [phone]  Discharge Exam: Filed Vitals:   08/13/13 1300  BP: 147/90  Pulse: 66  Temp: 97.3 F (36.3 C)  Resp: 20   Filed Vitals:   08/12/13 2102 08/12/13 2124 08/13/13 0556 08/13/13 1300  BP:  166/81 154/64 147/90  Pulse: 68 55 63 66  Temp:  97.4 F (36.3 C) 97.5 F (36.4 C) 97.3 F (36.3 C)  TempSrc:  Axillary Oral Oral  Resp: 18 20 18 20   Height:      Weight:      SpO2:  99% 100% 99%    General: Pt is alert, not in acute  distress Cardiovascular: irregular rhythm, rate controlled, S1/S2 appreciated Respiratory: Clear to auscultation bilaterally, no wheezing, no crackles, no rhonchi Abdominal: Soft, non tender, non distended, bowel sounds +, no guarding Extremities: no edema, no cyanosis, pulses palpable bilaterally DP and PT Neuro: Grossly nonfocal  Discharge Instructions  Discharge Orders   Future Orders Complete By Expires   Call MD for:  difficulty breathing, headache or visual disturbances  As directed    Call MD for:  persistant dizziness or light-headedness  As directed    Call MD for:  persistant nausea and vomiting  As directed    Call MD for:  severe uncontrolled pain  As directed    Diet - low sodium heart healthy  As directed    Discharge instructions  As directed    Comments:     Increase activity slowly  As directed        Medication List         acetaminophen 325 MG tablet  Commonly known as:  TYLENOL  Take 650 mg by mouth every 6 (six) hours as needed for pain.     alum & mag hydroxide-simeth 200-200-20 MG/5ML suspension  Commonly known as:  MAALOX/MYLANTA  Take 30 mLs by mouth every 6 (  six) hours as needed.     amoxicillin-clavulanate 875-125 MG per tablet  Commonly known as:  AUGMENTIN  Take 1 tablet by mouth 2 (two) times daily.     bisacodyl 10 MG suppository  Commonly known as:  DULCOLAX  Place 1 suppository (10 mg total) rectally every 12 (twelve) hours as needed.     enoxaparin 60 MG/0.6ML injection  Commonly known as:  LOVENOX  Inject 0.7 mLs (70 mg total) into the skin every 12 (twelve) hours.     feeding supplement Liqd  Take 237 mLs by mouth 2 (two) times daily between meals.     furosemide 20 MG tablet  Commonly known as:  LASIX  20 mg every other day.     HYDROcodone-acetaminophen 5-325 MG per tablet  Commonly known as:  NORCO  Take 1 tablet by mouth every 6 (six) hours as needed for pain.     hydroxypropyl methylcellulose 2.5 % ophthalmic solution   Commonly known as:  ISOPTO TEARS  Place 1 drop into both eyes 4 (four) times daily as needed (eye irritation).     meclizine 25 MG tablet  Commonly known as:  ANTIVERT  Take 25 mg by mouth 3 (three) times daily as needed for dizziness.     omeprazole 20 MG capsule  Commonly known as:  PRILOSEC  Take 20 mg by mouth daily.     polyethylene glycol packet  Commonly known as:  MIRALAX / GLYCOLAX  Take 17 g by mouth 2 (two) times daily.     ranitidine 150 MG tablet  Commonly known as:  ZANTAC  Take 150 mg by mouth at bedtime.     warfarin 3 MG tablet  Commonly known as:  COUMADIN  Take 1 tablet (3 mg total) by mouth one time only at 6 PM.           Follow-up Information   Follow up with ROSENBOWER,TODD J, MD. Schedule an appointment as soon as possible for a visit in 6 weeks.   Specialty:  General Surgery   Contact information:   [address] [place] [address] [phone]        The results of significant diagnostics from this hospitalization (including imaging, microbiology, ancillary and laboratory) are listed below for reference.    Significant Diagnostic Studies: US Abdomen Complete  08/09/2013   *RADIOLOGY REPORT*  Clinical Data:  Nausea and fever.  COMPLETE ABDOMINAL ULTRASOUND  Comparison:  [place] 06/28/2013.  Findings:  Gallbladder:  New gallbladder wall thickening, 8 mm in maximal thickness, with mild striated appearance.  There is layering sludge and gallstones.  Noted gallstone in the neck of the gallbladder appears fixed. No sonographic Murphy's sign.  Common bile duct:  8 mm diameter.  There is also intrahepatic biliary ductal dilatation with multiple bright spectral reflectors that shadow.  This is consistent with pneumobilia in a patient status post sphincterotomy. Dilation is less than prior.  Twinkle artifact within the CBD, without bright reflector.  Liver:  No focal lesion identified.  Within normal limits in parenchymal echogenicity.  IVC:   Normal where seen.  Pancreas:  Normal where seen.  No pancreatic ductal dilatation.  Spleen:  Measures 10 cm.  Right Kidney:  Diffuse cortical thinning with a 10 cm length.  No hydronephrosis.  Left Kidney:  Diffuse cortical thinning within a 10 cm length.  No hydronephrosis.  Abdominal aorta:  No aneurysm where seen.  Maximal diameter proximally, 3.1 cm.  IMPRESSION: 1.  Cholelithiasis and  new gallbladder wall thickening.  Although there is no sonographic Murphy's sign, findings could represent acute cholecystitis.  Consider biliary scintigraphy if there is clinical uncertainty. 2.  From July 2014, improved biliary ductal dilatation status post sphincterotomy and CBD stone extraction.  Pneumobilia decreases sensitivity for detecting current choledocholithiasis.   Original Report Authenticated By: Tiburcio Pea   Ir Perc Cholecystostomy  08/11/2013   *RADIOLOGY REPORT*  Indication: Acute on chronic cholecystitis, poor operative candidate.  ULTRASOUND AND FLUOROSCOPIC-GUIDED CHOLECYSTOSTOMY TUBE PLACEMENT  Comparisons: Abdominal ultrasound - 08/09/2013; MRCP - 06/29/2013; CT of the pelvis - 06/29/2013  Medications: Fentanyl 50 mcg IV; Versed 0.5 mg IV; The patient is currently admitted to the hospital and on intravenous antibiotics. Antibiotics were administered with an appropriate time frame prior to skin puncture.  Contrast: 10 ml Omnipaque-300 administered into the gallbladder fossa  Total Moderate Sedation Time: 15 minutes  Fluoroscopy Time: 48 seconds.  Complications: None immediate  Technique / Findings:  Informed written consent was obtained from the patient and the patient's son after a discussion of the risks, benefits and alternatives to treatment.  Questions regarding the procedure were encouraged and answered.  A timeout was performed prior to the initiation of the procedure.  Exhaustive preprocedural ultrasound scanning was performed however failed to delineate an adequate trans hepatic approach to  the gallbladder secondary to the low lying position of the gallbladder as well as the interposed colon and small bowel as was demonstrated on previous cross-sectional imaging.  As such, a direct approach to the gallbladder was necessary for cholecystostomy access.  The right upper abdominal quadrant was prepped and draped in the usual sterile fashion, and a sterile drape was applied covering the operative field.  Maximum barrier sterile technique with sterile gowns and gloves were used for the procedure.  A timeout was performed prior to the initiation of the procedure.  Local anesthesia was provided with 1% lidocaine with epinephrine.  Again, utilized a direct approach, a 22 gauge needle was advanced into the gallbladder under direct ultrasound guidance.  An ultrasound image was saved for documentation purposes.  Appropriate intraluminal puncture was confirmed with the efflux of purulent bile and advancement of an 0.018 wire into the gallbladder lumen. The needle was exchanged for an Accustick set.  A small amount of contrast was injected to confirm appropriate intraluminal positioning.  Over a Benson wire, a 10.2-French Cook cholecystomy tube was advanced into the gallbladder fossa, coiled and locked.  A small amount of purulent bile was aspirated with the sample cap and sent to the laboratory for analysis.  A small amount of contrast was injected as several post procedural spot radiographic images were obtained in various obliquities.  The catheter was secured to the skin with suture and a Stat lock device, connected to a drainage bag and a dressing was placed.  The patient tolerated the procedure well without immediate postprocedural complication.  Impression:  1.  Successful ultrasound and fluoroscopic guided placement of a 10.2 French cholecystostomy tube. Again, a direct approach to the gallbladder was necessary for cholecystostomy placement secondary to lack of adequate sonographic window for transhepatic  approach as well as the interposed colon and small bowel as was demonstrated on previously performed cross-sectional imaging.  2.  A small sample of the aspirated purulent appearing bile was sent to the laboratory for analysis.   Original Report Authenticated By: Tacey Ruiz, MD   Dg Chest Port 1 View  08/09/2013   *RADIOLOGY REPORT*  Clinical Data: Fever, weakness  PORTABLE CHEST - 1 VIEW  Comparison: Prior chest x-ray 07/04/2013  Findings: Stable cardiomegaly with left ventricular prominence. Atherosclerotic calcifications noted in the transverse aorta. Inspiratory volumes remain low.  There are bibasilar opacities which are similar compared to prior.  Surgical changes suggest prior thoracotomy with partial resection of the right fifth rib. No definite new airspace consolidation, pneumothorax or pulmonary edema.  Similar appearance of blunting of the right costophrenic angle which may reflect a small pleural effusion.  No acute osseous abnormality.  IMPRESSION:  Essentially stable chest x-ray without evidence of new acute cardiopulmonary process.  Stable pleural thickening versus small right pleural effusion.   Original Report Authenticated By: Malachy Moan, M.D.    Microbiology: CULTURE, BLOOD (ROUTINE X 2)     Status: None   Collection Time    08/09/13  6:50 PM      Result Value Range Status   Specimen Description BLOOD LEFT ANTECUBITAL   Final   Special Requests     Final   Value: BOTTLES DRAWN AEROBIC AND ANAEROBIC NO VOLUME INDICATED   Culture  Setup Time     Final   Value: 08/10/2013 03:15     Performed at [facility]   Culture     Final   Value:        BLOOD CULTURE RECEIVED NO GROWTH TO DATE CULTURE WILL BE HELD FOR 5 DAYS BEFORE ISSUING A FINAL NEGATIVE REPORT     Performed at [facility]   Report Status PENDING   Incomplete  CULTURE, BLOOD (ROUTINE X 2)     Status: None   Collection Time    08/09/13  7:10 PM      Result Value Range Status   Specimen  Description BLOOD L HAND   Final   Special Requests A BOTTLES DRAWN AEROBIC ONLY 3CC   Final   Culture  Setup Time     Final   Value: 08/10/2013 03:15     Performed at [facility]   Culture     Final   Value: STAPHYLOCOCCUS SPECIES (COAGULASE NEGATIVE)     Note: THE SIGNIFICANCE OF ISOLATING THIS ORGANISM FROM A SINGLE SET OF BLOOD CULTURES WHEN MULTIPLE SETS ARE DRAWN IS UNCERTAIN. PLEASE NOTIFY THE MICROBIOLOGY DEPARTMENT WITHIN ONE WEEK IF SPECIATION AND SENSITIVITIES ARE REQUIRED.     0107 Note: Gram Stain Report Called to,Read Back By and Verified With: Jeannett Senior 08/11/2013 [facility]     Performed at [facility]   Report Status 08/13/2013 FINAL   Final  MRSA PCR SCREENING     Status: None   Collection Time    08/10/13  1:12 AM      Result Value Range Status   MRSA by PCR NEGATIVE  NEGATIVE Final  URINE CULTURE     Status: None   Collection Time    08/10/13  4:12 AM      Result Value Range Status   Specimen Description URINE, CLEAN CATCH   Final   Special Requests NONE   Final   Culture  Setup Time     Final   Value: 08/10/2013 12:58     Performed at [facility] Count     Final   Value: NO GROWTH     Performed at [facility]   Culture     Final   Value: NO GROWTH     Performed at [facility]   Report Status 08/11/2013 FINAL  Final  CULTURE, ROUTINE-ABSCESS     Status: None   Collection Time    08/11/13  1:30 PM      Result Value Range Status   Specimen Description GALL BLADDER   Final   Special Requests Normal   Final   Gram Stain PENDING   Incomplete   Culture     Final   Value: Culture reincubated for better growth     Performed at [facility]   Report Status PENDING   Incomplete  CULTURE, BLOOD (ROUTINE X 2)     Status: None   Collection Time    08/12/13 10:05 AM      Result Value Range Status   Specimen Description BLOOD RIGHT ARM   Final   Special Requests BOTTLES DRAWN AEROBIC AND ANAEROBIC 5 CC EA    Final   Culture  Setup Time     Final   Value: 08/12/2013 16:14     Performed at [facility]   Culture     Final   Value:        BLOOD CULTURE RECEIVED NO GROWTH TO DATE CULTURE WILL BE HELD FOR 5 DAYS BEFORE ISSUING A FINAL NEGATIVE REPORT     Performed at [facility]   Report Status PENDING   Incomplete  CULTURE, BLOOD (ROUTINE X 2)     Status: None   Collection Time    08/12/13 10:17 AM      Result Value Range Status   Specimen Description BLOOD RIGHT ARM   Final   Special Requests BOTTLES DRAWN AEROBIC AND ANAEROBIC 9 CC EA   Final   Culture  Setup Time     Final   Value: 08/12/2013 16:15     Performed at [facility]   Culture     Final   Value:        BLOOD CULTURE RECEIVED NO GROWTH TO DATE CULTURE WILL BE HELD FOR 5 DAYS BEFORE ISSUING A FINAL NEGATIVE REPORT     Performed at [facility]   Report Status PENDING   Incomplete     Labs: Basic Metabolic Panel:  Recent Labs Lab 08/09/13 1749 08/10/13 0805 08/11/13 0405 08/13/13 0425  NA 128* 133* 133* 137  K 3.3* 3.0* 3.6 3.3*  CL 90* 99 103 106  CO2 25 24 23 24   GLUCOSE 109* 91 136* 98  BUN 56* 43* 32* 19  CREATININE 1.64* 1.31 1.23 1.09  CALCIUM 8.5 7.8* 7.8* 8.2*   Liver Function Tests:  Recent Labs Lab 08/09/13 1749 08/10/13 0805  AST 28 15  ALT 22 15  ALKPHOS 259* 194*  BILITOT 1.0 0.8  PROT 6.6 5.2*  ALBUMIN 2.5* 1.8*    Recent Labs Lab 08/09/13 1805  LIPASE 13   No results found for this basename: AMMONIA,  in the last 168 hours CBC:  Recent Labs Lab 08/09/13 1749 08/10/13 0805 08/11/13 0405 08/13/13 0425  WBC 23.0* 19.3* 18.7* 11.2*  NEUTROABS 20.3*  --   --   --   HGB 12.7* 10.8* 10.1* 11.3*  HCT 38.6* 32.9* 31.0* 35.2*  MCV 88.3 89.2 90.1 91.0  PLT 280 264 300 311   Cardiac Enzymes: No results found for this basename: CKTOTAL, CKMB, CKMBINDEX, TROPONINI,  in the last 168 hours BNP: BNP (last 3 results) No results found for this basename:  PROBNP,  in the last 8760 hours CBG: No results found for this basename: GLUCAP,  in the last 168 hours  Time coordinating discharge: Over 30 minutes

## 2013-08-13 NOTE — Progress Notes (Signed)
.  csj WJ,PA. He is comfortable, feeling better has a benign abdomen

## 2013-08-13 NOTE — Progress Notes (Signed)
Physical Therapy Treatment Patient Details Name: [name] MRN: [id number] DOB: [birth year] Today's Date: 08/13/2013 Time: 1220-1249 PT Time Calculation (min): 29 min  PT Assessment / Plan / Recommendation  History of Present Illness cholecystitis, S/P drain. Pt has h/o stroke with R hemiparesis   PT Comments   Progressing with mobility. Able to pivot with +1 assist today. Possible d/c back to [facility] later today.   Follow Up Recommendations  SNF     Does the patient have the potential to tolerate intense rehabilitation     Barriers to Discharge        Equipment Recommendations  None recommended by PT    Recommendations for Other Services OT consult  Frequency Min 3X/week   Progress towards PT Goals Progress towards PT goals: Progressing toward goals  Plan Current plan remains appropriate    Precautions / Restrictions Precautions Precautions: Fall   Pertinent Vitals/Pain Pt denies pain    Mobility  Bed Mobility Bed Mobility: Supine to Sit Supine to Sit: HOB elevated;3: Mod assist Sitting - Scoot to Edge of Bed: 3: Mod assist Details for Bed Mobility Assistance: HOB 60 degrees. Assist for trunk to upright. Pt used bedrail to aid with scooting, positioning. Increased time. Utilized bedpad to get to edge of bed.  Transfers Transfers: Sit to Stand;Stand to Sit;Stand Pivot Transfers Sit to Stand: From elevated surface;3: Mod assist;From bed Stand to Sit: 4: Min assist Stand Pivot Transfers: 3: Mod assist Details for Transfer Assistance: Assist to rise, stabilize, control descent, maneuver with RW. Pt able to grasp walker somewhat with R hand. Able to move R LE without external assistance. Increased time. Stand pviot towards L side, bed>recliner.  Ambulation/Gait Ambulation/Gait Assistance: Not tested (comment)    Exercises     PT Diagnosis:    PT Problem List:   PT Treatment Interventions:     PT Goals (current goals can now be found in the care plan  section)    Visit Information  Last PT Received On: 08/13/13 Assistance Needed: +1 History of Present Illness: cholecystitis, S/P drain. Pt has h/o stroke with R hemiparesis    Subjective Data      Cognition  Cognition Arousal/Alertness: Awake/[name] Behavior During Therapy: WFL for tasks assessed/performed Overall Cognitive Status: Within Functional Limits for tasks assessed    Balance     End of Session PT - End of Session Equipment Utilized During Treatment: Gait belt Activity Tolerance: Patient tolerated treatment well Patient left: in chair;with call bell/phone within reach Nurse Communication: Mobility status   GP     [name], MPT Pager: [phone]

## 2013-08-13 NOTE — Progress Notes (Signed)
ANTICOAGULATION CONSULT NOTE - Follow Up Consult  Pharmacy Consult for Heparin Indication: h/o afib, CVA   Allergies  Allergen Reactions  . Sulfonamide Derivatives Rash    REACTION: rash    Patient Measurements: Height: 5\' 3"  (160 cm) Weight: 150 lb 12.7 oz (68.4 kg) IBW/kg (Calculated) : 56.9 Heparin Dosing Weight:   Vital Signs: Temp: 97.4 F (36.3 C) (09/07 2124) Temp src: Axillary (09/07 2124) BP: 166/81 mmHg (09/07 2124) Pulse Rate: 55 (09/07 2124)  Labs:  Recent Labs  08/10/13 0805 08/11/13 0405 08/12/13 0520 08/12/13 2215 08/13/13 0425  HGB 10.8* 10.1*  --   --  11.3*  HCT 32.9* 31.0*  --   --  35.2*  PLT 264 300  --   --  311  APTT  --  39*  --   --   --   LABPROT 23.1* 15.1 15.0  --  15.7*  INR 2.12* 1.22 1.21  --  1.28  HEPARINUNFRC  --   --   --  <0.10* <0.10*  CREATININE 1.31 1.23  --   --   --     Estimated Creatinine Clearance: 34 ml/min (by C-G formula based on Cr of 1.23).   Medications:  Infusions:  . dextrose 5 % and 0.9% NaCl 75 mL/hr at 08/12/13 2329  . heparin      Assessment: Patient with low heparin level again.  Felt the first one was due to iv site issues which RN addressed around 10pm 9/7.  With 2nd low heparin level, will adjust heparin to shoot for goal.  Goal of Therapy:  Heparin level 0.3-0.7 units/ml Monitor platelets by anticoagulation protocol: Yes   Plan:  Increase heparin to 1200 units/hr, recheck level at 1300.  Darlina Guys, Jacquenette Shone Crowford 08/13/2013,5:51 AM

## 2013-08-13 NOTE — Progress Notes (Signed)
Subjective: He seems a little confused this AM, but trying to eat, he says he didn't want much yesterday.  I think he's a bit concerned about getting a laxative, then being incontinent earlier this AM.  Objective: Vital signs in last 24 hours: Temp:  [97.4 F (36.3 C)-98.6 F (37 C)] 97.5 F (36.4 C) (09/08 0556) Pulse Rate:  [55-68] 63 (09/08 0556) Resp:  [18-20] 18 (09/08 0556) BP: (133-166)/(64-81) 154/64 mmHg (09/08 0556) SpO2:  [99 %-100 %] 100 % (09/08 0556) Last BM Date: 08/11/13 Drain 60 ml, recorded yesterday 570 PO recorded. Stools: 1 recorded yesterday Afebrile, VSS, HR well controlled  K+ 3.3 WBC improving,  On heparin and coumadin restarted yesterday. Intake/Output from previous day: 09/07 0701 - 09/08 0700 In: 2810.8 [P.O.:570; I.V.:1890.8; IV Piggyback:350] Out: 910 [Urine:850; Drains:60] Intake/Output this shift:    General appearance: alert, cooperative, no distress and He seems a bit confused this AM. Resp: clear to auscultation bilaterally and anterior exam GI: soft, non-tender; bowel sounds normal; no masses,  no organomegaly and drain in place, bag has been emptied, but looks clear green from what is left.  Lab Results:   Recent Labs  08/11/13 0405 08/13/13 0425  WBC 18.7* 11.2*  HGB 10.1* 11.3*  HCT 31.0* 35.2*  PLT 300 311    BMET  Recent Labs  08/11/13 0405 08/13/13 0425  NA 133* 137  K 3.6 3.3*  CL 103 106  CO2 23 24  GLUCOSE 136* 98  BUN 32* 19  CREATININE 1.23 1.09  CALCIUM 7.8* 8.2*   PT/INR  Recent Labs  08/12/13 0520 08/13/13 0425  LABPROT 15.0 15.7*  INR 1.21 1.28     Recent Labs Lab 08/09/13 1749 08/10/13 0805  AST 28 15  ALT 22 15  ALKPHOS 259* 194*  BILITOT 1.0 0.8  PROT 6.6 5.2*  ALBUMIN 2.5* 1.8*     Lipase     Component Value Date/Time   LIPASE 13 08/09/2013 1805     Studies/Results: Ir Perc Cholecystostomy  08/11/2013   *RADIOLOGY REPORT*  Indication: Acute on chronic cholecystitis, poor  operative candidate.  ULTRASOUND AND FLUOROSCOPIC-GUIDED CHOLECYSTOSTOMY TUBE PLACEMENT  Comparisons: Abdominal ultrasound - 08/09/2013; MRCP - 06/29/2013; CT of the pelvis - 06/29/2013  Medications: Fentanyl 50 mcg IV; Versed 0.5 mg IV; The patient is currently admitted to the hospital and on intravenous antibiotics. Antibiotics were administered with an appropriate time frame prior to skin puncture.  Contrast: 10 ml Omnipaque-300 administered into the gallbladder fossa  Total Moderate Sedation Time: 15 minutes  Fluoroscopy Time: 48 seconds.  Complications: None immediate  Technique / Findings:  Informed written consent was obtained from the patient and the patient's son after a discussion of the risks, benefits and alternatives to treatment.  Questions regarding the procedure were encouraged and answered.  A timeout was performed prior to the initiation of the procedure.  Exhaustive preprocedural ultrasound scanning was performed however failed to delineate an adequate trans hepatic approach to the gallbladder secondary to the low lying position of the gallbladder as well as the interposed colon and small bowel as was demonstrated on previous cross-sectional imaging.  As such, a direct approach to the gallbladder was necessary for cholecystostomy access.  The right upper abdominal quadrant was prepped and draped in the usual sterile fashion, and a sterile drape was applied covering the operative field.  Maximum barrier sterile technique with sterile gowns and gloves were used for the procedure.  A timeout was performed prior to the initiation  of the procedure.  Local anesthesia was provided with 1% lidocaine with epinephrine.  Again, utilized a direct approach, a 22 gauge needle was advanced into the gallbladder under direct ultrasound guidance.  An ultrasound image was saved for documentation purposes.  Appropriate intraluminal puncture was confirmed with the efflux of purulent bile and advancement of an 0.018  wire into the gallbladder lumen. The needle was exchanged for an Accustick set.  A small amount of contrast was injected to confirm appropriate intraluminal positioning.  Over a Benson wire, a 10.2-French Cook cholecystomy tube was advanced into the gallbladder fossa, coiled and locked.  A small amount of purulent bile was aspirated with the sample cap and sent to the laboratory for analysis.  A small amount of contrast was injected as several post procedural spot radiographic images were obtained in various obliquities.  The catheter was secured to the skin with suture and a Stat lock device, connected to a drainage bag and a dressing was placed.  The patient tolerated the procedure well without immediate postprocedural complication.  Impression:  1.  Successful ultrasound and fluoroscopic guided placement of a 10.2 French cholecystostomy tube. Again, a direct approach to the gallbladder was necessary for cholecystostomy placement secondary to lack of adequate sonographic window for transhepatic approach as well as the interposed colon and small bowel as was demonstrated on previously performed cross-sectional imaging.  2.  A small sample of the aspirated purulent appearing bile was sent to the laboratory for analysis.   Original Report Authenticated By: Tacey Ruiz, MD    Medications: . feeding supplement  237 mL Oral BID BM  . lip balm  1 application Topical BID  . pantoprazole  40 mg Oral Daily  . piperacillin-tazobactam (ZOSYN)  IV  3.375 g Intravenous Q8H  . polyethylene glycol  17 g Oral BID  . potassium chloride  40 mEq Oral Once  . sodium chloride  3 mL Intravenous Q12H  . vancomycin  1,000 mg Intravenous QHS  . Warfarin - Pharmacist Dosing Inpatient   Does not apply q1800    Assessment/Plan Acute cholecystitis with Drain placement 08/11/13. S/p ERCP 07/04/13,for choledocholithiasis, Pt and family opted not to have surgery in July Fever/Leukocytoisis. Atrial fibrillation/chronic  coumadin Hypertension Hx of CVA Hx of acute renal failure Anemia GERD PCM DNR/DNI  Plan:  He appears to be improving, he's on a regular diet, PO intake has been moderate, but he's not complaining of any pain, nausea, or vomiting.  He will need to have the drain 6-8 weeks and then decide on removal/possible cholecystectomy. Follow up at that time with Dr. Abbey Chatters.  LOS: 4 days    Katrese Shell 08/13/2013

## 2013-08-13 NOTE — Progress Notes (Signed)
Discharged to [facility]  Report given to [employer]. Patient d/c alert and oriented, with bilateral hearing aides on, patient belongings, electric shaver and clothing place in belonging bag. Discharge not in distress. PIV removed no infection or infiltration noted on insertion site, billiary drain intact.

## 2013-08-13 NOTE — Progress Notes (Signed)
Patient is set to discharge back to [facility] today. Patient aware & CSW left message for patient's son, Rod (ph#: [phone]). Katie @ [facility] made aware. Discharge packet given to RN, Delorise Shiner. PTAR scheduled for 2:30p pickup (Service Request Id: [id number]).   Unice Bailey, LCSW [facility] Clinical Social Worker cell #: [phone]

## 2013-08-13 NOTE — Progress Notes (Signed)
ANTICOAGULATION CONSULT NOTE - Follow Up  Pharmacy Consult for Warfarin, Change from IV Heparin to SQ Lovenox Indication: h/o afib, CVA   Allergies  Allergen Reactions  . Sulfonamide Derivatives Rash    REACTION: rash    Patient Measurements: Height: 5\' 3"  (160 cm) Weight: 150 lb 12.7 oz (68.4 kg) IBW/kg (Calculated) : 56.9 Heparin Dosing Weight: 68.4kg  Vital Signs: Temp: 97.5 F (36.4 C) (09/08 0556) Temp src: Oral (09/08 0556) BP: 154/64 mmHg (09/08 0556) Pulse Rate: 63 (09/08 0556)  Labs:  Recent Labs  08/11/13 0405 08/12/13 0520 08/12/13 2215 08/13/13 0425  HGB 10.1*  --   --  11.3*  HCT 31.0*  --   --  35.2*  PLT 300  --   --  311  APTT 39*  --   --   --   LABPROT 15.1 15.0  --  15.7*  INR 1.22 1.21  --  1.28  HEPARINUNFRC  --   --  <0.10* <0.10*  CREATININE 1.23  --   --  1.09    Estimated Creatinine Clearance: 38.4 ml/min (by C-G formula based on Cr of 1.09).    Assessment: 79 yo M, SNF resident on Coumadin PTA for h/o paroxysmal afib and CVA.  Coumadin was on hold since admission for IR perc cholecystostomy 9/6 given acute cholecystitis and cholelithiasis. Cardiology was on board and recommended continuing anticoagulation post-procedure given CHADS score of 4 and to bridge with IV heparin as needed. Patient is now s/p IR perc cholecystostomy and MD would like to start IV heparin bridge with Coumadin 24 hours post-procedure.  Of note, patient received Vitamin K 5mg  IV x 1 on 9/5. Per [facility] anti-coag notes: patient was on Coumadin 3mg  daily except 4.5mg  on Tue/Sat. This dose was verified with patient.   Order today to change IV heparin to SQ Lovenox. Coordinated with RN - plan will be to stop IV heparin now, wait 1 hour, then start Lovenox 1mg /kg q12h at 11am. Bridge with Lovenox until INR >2.  INR remains subherapeutic as expected after only one dose of warfarin yesterday, and after receiving Vit K 5mg  IV on 9/5.   Goal of Therapy:  Anti-Xa level  0.6-1.2 units/ml 4hrs after LMWH dose given Monitor platelets by anticoagulation protocol: Yes   Plan:   Stop IV heparin now, wait 1 hour, then start Lovenox 1mg /kg SQ q12h at 11am  Coumadin 3 mg po x 1 tonight  Daily PT/INR  Change daily CBC to q72h per Lovenox protocol  Pharmacy will f/u  Darrol Angel, PharmD Pager: [phone] 08/13/2013 10:13 AM

## 2013-08-14 LAB — CULTURE, ROUTINE-ABSCESS: Special Requests: NORMAL

## 2013-08-15 ENCOUNTER — Non-Acute Institutional Stay (SKILLED_NURSING_FACILITY): Payer: Medicare Other | Admitting: Nurse Practitioner

## 2013-08-15 ENCOUNTER — Encounter: Payer: Self-pay | Admitting: Nurse Practitioner

## 2013-08-15 DIAGNOSIS — I6350 Cerebral infarction due to unspecified occlusion or stenosis of unspecified cerebral artery: Secondary | ICD-10-CM

## 2013-08-15 DIAGNOSIS — R609 Edema, unspecified: Secondary | ICD-10-CM

## 2013-08-15 DIAGNOSIS — Z5181 Encounter for therapeutic drug level monitoring: Secondary | ICD-10-CM

## 2013-08-15 DIAGNOSIS — Z7901 Long term (current) use of anticoagulants: Secondary | ICD-10-CM

## 2013-08-15 DIAGNOSIS — K5900 Constipation, unspecified: Secondary | ICD-10-CM

## 2013-08-15 DIAGNOSIS — H5440 Blindness, one eye, unspecified eye: Secondary | ICD-10-CM

## 2013-08-15 DIAGNOSIS — Z434 Encounter for attention to other artificial openings of digestive tract: Secondary | ICD-10-CM

## 2013-08-15 DIAGNOSIS — K219 Gastro-esophageal reflux disease without esophagitis: Secondary | ICD-10-CM

## 2013-08-15 DIAGNOSIS — I639 Cerebral infarction, unspecified: Secondary | ICD-10-CM

## 2013-08-15 NOTE — Assessment & Plan Note (Signed)
Stable with MiraLax daily  

## 2013-08-15 NOTE — Assessment & Plan Note (Addendum)
Lovenox to bridge Coumadin to therapeutic level-INR 2-3. INR 2.3 08/15/13-dc Lovenox. Continue Coumadin 3mg , repeat INR 08/17/13. Hx of CVA and Afib.

## 2013-08-15 NOTE — Assessment & Plan Note (Signed)
Left eye

## 2013-08-15 NOTE — Assessment & Plan Note (Signed)
Takes Omeprazole 20mg and Ranitidine 150mg    

## 2013-08-15 NOTE — Progress Notes (Signed)
Patient ID: [name], male   DOB: 12/01/1919, 79 y.o.   MRN: [id number] Code Status: DNR  Allergies  Allergen Reactions  . Sulfonamide Derivatives Rash    REACTION: rash    Chief Complaint  Patient presents with  . Medical Managment of Chronic Issues    s/p  cholecystostomy placement     HPI: Patient is a 79 y.o. male seen in the SNF at [facility] today for evaluation s/p hospitalization 08/09/13-08/13/13 for acute cholecystitis/cholangitis. The biliary drain inserted which per surgery needs to stay for at least 6 weeks prior to reevaluation for removal. Will continue antibiotics, Augmentin for 2 weeks on discharge. Dc Lovenox today INR 2.3 and continue  Coumadin 3mg  daily and recheck INR 08/17/13. Potassium has been repleted prior to discharge, BMP pending today. WBC was down from 19 to 11, Hgb 11.3 upon hospital discharge--update CBC due today.   He has history of A. fib on Coumadin, malignant melanoma, right femoral neck fracture, embolic CVA with right wrist/hand contracture and right sided weakness even if grip strength 5/5, OSA, HTN, HL, mechanical fall on 05/29/13. ED  06/28/13 with complaints of an episode of vomiting and fever of 103.43F. In the ED, WBC 24.5, alkaline phosphatase 866 & abdominal ultrasound which showed marked intra-and extrahepatic biliary dietitian without obstructing stone or lesion. The CT abdomen 06/29/13 shows intra-and extrahepatic bililary dilatation. - was briefly on empiric IV Unasyn. - he underwent ERCP with sphincterotomy and successful stone extraction. -CLinically asymptomatic and improved since, LFTS improved, patient declines cholecystectomy while in hospital and will Fu with Dr.Ingram in few weeks to discuss this.   Problem List Items Addressed This Visit   Anticoagulation goal of INR 2 to 3     Lovenox to bridge Coumadin to therapeutic level-INR 2-3. INR 2.3 08/15/13-dc Lovenox. Continue Coumadin 3mg , repeat INR 08/17/13. Hx of CVA and Afib.      RESOLVED: BLINDNESS, ONE EYE     Left eye    Cholecystostomy care - Primary     For 6 weeks then GI f/u to evaluate for removal.     CVA (cerebral vascular accident)     Right sided weakness with right wrist/hand contracture. Grip strength is still 5/5. Ambulates with walker and w/c to go further.     RESOLVED: Edema     Not apparent. Takes Furosemide 20mg , K was replete while in hospital, update BMP today.    GERD     Takes Omeprazole 20mg  and Ranitidine 150mg          Unspecified constipation     Stable with MiraLax daily.        Review of Systems:  Review of Systems  Constitutional: Positive for weight loss. Negative for fever, chills, malaise/fatigue and diaphoresis.       Generalized weakness and the right sided weakness from previous CVA  HENT: Positive for hearing loss. Negative for ear pain, nosebleeds, congestion, sore throat, neck pain, tinnitus and ear discharge.   Eyes: Negative for blurred vision, double vision, photophobia, pain, discharge and redness.       Left eye blindness  Respiratory: Negative for cough, hemoptysis, sputum production, shortness of breath, wheezing and stridor.   Cardiovascular: Negative for chest pain, palpitations, orthopnea, claudication, leg swelling and PND.  Gastrointestinal: Negative for heartburn, nausea, vomiting, abdominal pain, diarrhea, constipation, blood in stool and melena.  Genitourinary: Negative for dysuria, urgency, frequency, hematuria and flank pain.  Musculoskeletal: Negative for myalgias, back pain, joint pain and falls.  Skin: Negative for itching and rash.       Right cholecystostomy.   Neurological: Positive for focal weakness and weakness. Negative for dizziness, tingling, tremors, sensory change, speech change, seizures, loss of consciousness and headaches.       Right sided weakness from previous CVA even if grip strength is 5/5. The right wrist and hand contracture. Ambulates with walker and w/c to go further.    Endo/Heme/Allergies: Negative for environmental allergies and polydipsia. Bruises/bleeds easily.  Psychiatric/Behavioral: Negative for depression, suicidal ideas, hallucinations, memory loss and substance abuse. The patient is not nervous/anxious and does not have insomnia.      Past Medical History  Diagnosis Date  . GERD (gastroesophageal reflux disease)   . Hypertension   . Hearing loss     wears hearing aid - right side  . Leg swelling   . Trouble swallowing   . Bruises easily     due to coumadin  . Weakness     difficulty walking  . Contact lens/glasses fitting   . Stroke 1999  . Cancer   . 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 Gait disturbance   . Dyslipidemia   . COPD (chronic obstructive pulmonary disease)   . Peripheral vascular disease   . History of melanoma   . Diverticulosis   . Abnormality of gait 05/16/2013  . Choledocholithiasis with obstruction 06/28/13  . Embolic stroke   . PAF (paroxysmal atrial fibrillation)   . Hyperlipidemia   . Bradycardia   . Protein-calorie malnutrition, severe   . Ventricular tachycardia (paroxysmal)   . Obstructive sleep apnea of adult     uses cpap, pt does not know setting  . Personal history of fall 05/29/2013  . Fracture of femoral neck, right, closed 3114  . Long term (current) use of anticoagulants     PAF and embolic CVA  . Anemia, unspecified 07/05/2013  . Xerophthalmia 07/05/2013  . 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 Actinic keratosis   . Seborrheic keratosis    Past Surgical History  Procedure Laterality Date  . Excision of melanoma  2009    left leg  . Melanoma excision      many melanoma removed in past  . Melanoma excision  10/11/2011    Procedure: MELANOMA EXCISION;  Surgeon: Valarie Merino, MD;  Location: [facility] OR;  Service: General;  Laterality: Left;  EXCISION melanoma left leg with full thickness skin grafting from left lower abdomen.  . Joint replacement  2012    r fem head fx  . Lung benign area removed   1970's  .  Hernia repair  1983  . Melanoma excision  05/16/2012    Procedure: MELANOMA EXCISION;  Surgeon: Valarie Merino, MD;  Location: [facility];  Service: General;  Laterality: Left;  Nodule Removal of Melanoma on Left Shin  . Ercp N/A 07/04/2013    Procedure: ENDOSCOPIC RETROGRADE CHOLANGIOPANCREATOGRAPHY (ERCP);  Surgeon: Hilarie Fredrickson, MD;  Location: [facility];  Service: Endoscopy;  Laterality: N/A;   Social History:   reports that he quit smoking about 48 years ago. His smoking use included Pipe. He has never used smokeless tobacco. He reports that he does not drink alcohol or use illicit drugs.  Family History  Problem Relation Age of Onset  . Heart disease Father 65  . Cancer Son     prostate  . Dementia Sister     One sister has dementia    Medications: Patient's Medications  New Prescriptions  No medications on file  Previous Medications   ACETAMINOPHEN (TYLENOL) 325 MG TABLET    Take 650 mg by mouth every 6 (six) hours as needed for pain.    ALUM & MAG HYDROXIDE-SIMETH (MAALOX/MYLANTA) 200-200-20 MG/5ML SUSPENSION    Take 30 mLs by mouth every 6 (six) hours as needed.   AMOXICILLIN-CLAVULANATE (AUGMENTIN) 875-125 MG PER TABLET    Take 1 tablet by mouth 2 (two) times daily.   BISACODYL (DULCOLAX) 10 MG SUPPOSITORY    Place 1 suppository (10 mg total) rectally every 12 (twelve) hours as needed.   FEEDING SUPPLEMENT (ENSURE COMPLETE) LIQD    Take 237 mLs by mouth 2 (two) times daily between meals.   FUROSEMIDE (LASIX) 20 MG TABLET    20 mg every other day.    HYDROXYPROPYL METHYLCELLULOSE (ISOPTO TEARS) 2.5 % OPHTHALMIC SOLUTION    Place 1 drop into both eyes 4 (four) times daily as needed (eye irritation).    MECLIZINE (ANTIVERT) 25 MG TABLET    Take 25 mg by mouth 3 (three) times daily as needed for dizziness.    OMEPRAZOLE (PRILOSEC) 20 MG CAPSULE    Take 20 mg by mouth daily.   POLYETHYLENE GLYCOL (MIRALAX / GLYCOLAX) PACKET    Take 17 g by mouth 2 (two) times daily.   RANITIDINE  (ZANTAC) 150 MG TABLET    Take 150 mg by mouth at bedtime.   WARFARIN (COUMADIN) 3 MG TABLET    Take 1 tablet (3 mg total) by mouth one time only at 6 PM.  Modified Medications   No medications on file  Discontinued Medications   ENOXAPARIN (LOVENOX) 60 MG/0.6ML INJECTION    Inject 0.7 mLs (70 mg total) into the skin every 12 (twelve) hours.     Physical Exam: Physical Exam  Constitutional: He is oriented to person, place, and time. He appears well-developed and well-nourished. No distress.  HENT:  Head: Normocephalic and atraumatic.  Right Ear: External ear normal.  Left Ear: External ear normal.  Nose: Nose normal.  Mouth/Throat: Oropharynx is clear and moist. No oropharyngeal exudate.  Eyes: Conjunctivae are normal. Pupils are equal, round, and reactive to light. Right eye exhibits no discharge. No scleral icterus.  Left eye blindness  Neck: Normal range of motion. Neck supple. No JVD present. No tracheal deviation present. No thyromegaly present.  Cardiovascular: Normal rate and normal heart sounds.  An irregular rhythm present.  Pulmonary/Chest: Effort normal and breath sounds normal. No stridor. No respiratory distress. He has no wheezes. He has no rales. He exhibits no tenderness.  Abdominal: He exhibits no distension. There is no tenderness. There is no rebound and no guarding.  Musculoskeletal: Normal range of motion. He exhibits no edema and no tenderness.  The right sided weakness since previous CVA-muscle strength is still 5/5. The right wrist and hand contractures-able to make fist and cannot extend fully. Ambulates with walker and w/c to go further.   Lymphadenopathy:    He has no cervical adenopathy.  Neurological: He is alert and oriented to person, place, and time. He displays abnormal reflex. No cranial nerve deficit. Coordination normal.  Skin: Skin is warm and dry. No rash noted. He is not diaphoretic. No erythema. No pallor.  The right cholecystostomy.    Psychiatric: Thought content normal. His mood appears anxious. His affect is not angry and not inappropriate. His speech is not rapid and/or pressured, not delayed and not slurred. He is agitated. He is not aggressive, not hyperactive, not slowed and  not withdrawn. Cognition and memory are impaired. He expresses impulsivity and inappropriate judgment. He does not exhibit a depressed mood. He exhibits abnormal recent memory. He exhibits normal remote memory.    Filed Vitals:   08/15/13 1222  BP: 130/86  Pulse: 76  Temp: 96.9 F (36.1 C)  TempSrc: Tympanic  Resp: 18      Labs reviewed: Basic Metabolic Panel:  Recent Labs  81/19/14 1820  08/10/13 0805 08/11/13 0405 08/13/13 0425  NA  --   < > 133* 133* 137  K  --   < > 3.0* 3.6 3.3*  CL  --   < > 99 103 106  CO2  --   < > 24 23 24   GLUCOSE  --   < > 91 136* 98  BUN  --   < > 43* 32* 19  CREATININE  --   < > 1.31 1.23 1.09  CALCIUM  --   < > 7.8* 7.8* 8.2*  MG 1.8  --   --   --   --   < > = values in this interval not displayed. Liver Function Tests:  Recent Labs  07/05/13 0417 08/09/13 1749 08/10/13 0805  AST 43* 28 15  ALT 43 22 15  ALKPHOS 570* 259* 194*  BILITOT 1.6* 1.0 0.8  PROT 5.5* 6.6 5.2*  ALBUMIN 2.2* 2.5* 1.8*    Recent Labs  06/28/13 2114 08/09/13 1805  LIPASE 18 13   CBC:  Recent Labs  05/29/13 1616 06/28/13 2114  08/09/13 1749 08/10/13 0805 08/11/13 0405 08/13/13 0425  WBC 10.6* 24.5*  < > 23.0* 19.3* 18.7* 11.2*  NEUTROABS 9.0* 22.8*  --  20.3*  --   --   --   HGB 13.3 11.2*  < > 12.7* 10.8* 10.1* 11.3*  HCT 40.3 34.3*  < > 38.6* 32.9* 31.0* 35.2*  MCV 89.2 89.1  < > 88.3 89.2 90.1 91.0  PLT 340 345  < > 280 264 300 311  < > = values in this interval not displayed.  Past Procedures: 06/28/13 [place] abd  IMPRESSION:  1. Marked intra and extrahepatic biliary dilation. No obstructing  lesion or stone is identified. The common bile duct is dilated  into the pancreatic head.  2.  Bilateral hyperechoic kidneys with thinning of the parenchyma.  This is compatible with nonspecific medical renal disease.  3. Single large gallstone without evidence for acute  cholecystitis.   06/29/13 2 D echocardiogram: EF 55%  06/29/13 MRI abd w/o [facility] Abdomen  08/09/2013 *RADIOLOGY REPORT* Clinical Data: Nausea and fever. COMPLETE ABDOMINAL ULTRASOUND Comparison: [place] 06/28/2013. IMPRESSION: 1. Cholelithiasis and new gallbladder wall thickening. Although there is no sonographic Murphy's sign, findings could represent acute cholecystitis. Consider biliary scintigraphy if there is clinical uncertainty. 2. From July 2014, improved biliary ductal dilatation status post sphincterotomy and CBD stone extraction. Pneumobilia decreases sensitivity for detecting current choledocholithiasis.   08/11/2013 *RADIOLOGY REPORT* Indication: Acute on chronic cholecystitis, poor operative candidate. ULTRASOUND AND FLUOROSCOPIC-GUIDED CHOLECYSTOSTOMY TUBE PLACEMENT Comparisons: Abdominal ultrasound - 08/09/2013; MRCP - 06/29/2013; CT of the pelvis - 06/29/2013 Contrast Complications: Impression: 1. Successful ultrasound and fluoroscopic guided placement of a 10.2 French cholecystostomy tube. Again, a direct approach to the gallbladder was necessary for cholecystostomy placement secondary to lack of adequate sonographic window for transhepatic approach as well as the interposed colon and small bowel as was demonstrated on previously performed cross-sectional imaging. 2. A small sample of the aspirated purulent appearing bile was sent to the  laboratory for analysis.   08/09/2013 *RADIOLOGY REPORT* Clinical Data: Fever, weakness PORTABLE CHEST - 1 VIEW Comparison: Prior chest x-ray 07/04/2013  IMPRESSION: Essentially stable chest x-ray without evidence of new acute cardiopulmonary process. Stable pleural thickening versus small right pleural effusion.   Assessment/PlanIMPRESSION:  1. Two obstructing stones within the common  bile duct and a large  stone at the confluence of the cystic duct and the common hepatic  duct. There is significant biliary obstruction t proximal to the  stones.  2. Single gallstone within the gallbladder. Cystic duct is also  dilated.  3. Multiple cysts throughout the pancreas suggest chronic  pancreatitis.  Cholecystostomy care For 6 weeks then GI f/u to evaluate for removal.   Anticoagulation goal of INR 2 to 3 Lovenox to bridge Coumadin to therapeutic level-INR 2-3. INR 2.3 08/15/13-dc Lovenox. Continue Coumadin 3mg , repeat INR 08/17/13. Hx of CVA and Afib.   BLINDNESS, ONE EYE Left eye  CVA (cerebral vascular accident) Right sided weakness with right wrist/hand contracture. Grip strength is still 5/5. Ambulates with walker and w/c to go further.   Edema Not apparent. Takes Furosemide 20mg , K was replete while in hospital, update BMP today.  GERD Takes Omeprazole 20mg  and Ranitidine 150mg        Unspecified constipation Stable with MiraLax daily.     Family/ Staff Communication: observe the patient.   Goals of Care: SNF  Labs/tests ordered: CBC and BMP pending.

## 2013-08-15 NOTE — Assessment & Plan Note (Signed)
For 6 weeks then GI f/u to evaluate for removal.

## 2013-08-15 NOTE — Assessment & Plan Note (Signed)
Not apparent. Takes Furosemide 20mg , K was replete while in hospital, update BMP today.

## 2013-08-15 NOTE — Assessment & Plan Note (Signed)
Right sided weakness with right wrist/hand contracture. Grip strength is still 5/5. Ambulates with walker and w/c to go further.

## 2013-08-16 LAB — CBC AND DIFFERENTIAL
HCT: 35 % — AB (ref 41–53)
Hemoglobin: 11.4 g/dL — AB (ref 13.5–17.5)

## 2013-08-16 LAB — CULTURE, BLOOD (ROUTINE X 2): Culture: NO GROWTH

## 2013-08-18 LAB — CULTURE, BLOOD (ROUTINE X 2): Culture: NO GROWTH

## 2013-08-30 ENCOUNTER — Telehealth (INDEPENDENT_AMBULATORY_CARE_PROVIDER_SITE_OTHER): Payer: Self-pay | Admitting: General Surgery

## 2013-08-30 NOTE — Telephone Encounter (Signed)
Nurse at [facility] called to report the biliary drain is beginning to leak a very little bit around the tube insertion site during the flushing of it.  Suggested slower flush and to cleanse the area, pat dry and okay to reinforce with gauze prn.  They will call back if leaking worsens to consider need to replace the drain.  Nurse understands.

## 2013-09-12 ENCOUNTER — Non-Acute Institutional Stay (SKILLED_NURSING_FACILITY): Payer: Medicare Other | Admitting: Nurse Practitioner

## 2013-09-12 ENCOUNTER — Encounter: Payer: Self-pay | Admitting: Nurse Practitioner

## 2013-09-12 DIAGNOSIS — D72829 Elevated white blood cell count, unspecified: Secondary | ICD-10-CM

## 2013-09-12 DIAGNOSIS — R635 Abnormal weight gain: Secondary | ICD-10-CM

## 2013-09-12 DIAGNOSIS — I6350 Cerebral infarction due to unspecified occlusion or stenosis of unspecified cerebral artery: Secondary | ICD-10-CM

## 2013-09-12 DIAGNOSIS — E43 Unspecified severe protein-calorie malnutrition: Secondary | ICD-10-CM

## 2013-09-12 DIAGNOSIS — K219 Gastro-esophageal reflux disease without esophagitis: Secondary | ICD-10-CM

## 2013-09-12 DIAGNOSIS — K5900 Constipation, unspecified: Secondary | ICD-10-CM

## 2013-09-12 DIAGNOSIS — Z7901 Long term (current) use of anticoagulants: Secondary | ICD-10-CM

## 2013-09-12 DIAGNOSIS — I639 Cerebral infarction, unspecified: Secondary | ICD-10-CM

## 2013-09-12 DIAGNOSIS — K810 Acute cholecystitis: Secondary | ICD-10-CM

## 2013-09-12 DIAGNOSIS — I10 Essential (primary) hypertension: Secondary | ICD-10-CM

## 2013-09-12 NOTE — Assessment & Plan Note (Signed)
The CT abdomen 06/29/13 shows intra-and extrahepatic bililary dilatation. - was briefly on empiric IV Unasyn. - after anticoagulation was reversed with Vitamin K, he underwent ERCP with sphincterotomy and successful stone extraction. -CLinically asymptomatic and improved since, LFTS improved, patient declines cholecystectomy while in hospital and will Fu with Dr.Ingram. S/p hospitalization 08/09/13-08/13/13. Inserted biliary drain witch per sugery needs to say for at least 6 weeks prior to re evaluation for removal.

## 2013-09-12 NOTE — Assessment & Plan Note (Signed)
Wbc was 17.7 08/16/13--update CBC

## 2013-09-12 NOTE — Assessment & Plan Note (Addendum)
Contributes to his weight loss. Check TSH

## 2013-09-12 NOTE — Assessment & Plan Note (Signed)
Takes Omeprazole 20mg and Ranitidine 150mg    

## 2013-09-12 NOTE — Assessment & Plan Note (Signed)
Right sided weakness with right wrist/hand contracture. Grip strength is still 5/5. Ambulates with walker and w/c to go further.  

## 2013-09-12 NOTE — Assessment & Plan Note (Signed)
Gradual weight loss--#149 07/2013-147 08/2013-144 08/2013. Currently he states his appetite is good and takes dietary supplement. Will continue to monitor weight.

## 2013-09-12 NOTE — Assessment & Plan Note (Signed)
Therapeutic presently. INR 2.7 09/12/13, he takes Coumadin 3mg  and 4mg  alternating dose. Hx of CVA and Afib.

## 2013-09-12 NOTE — Assessment & Plan Note (Signed)
Stable with MiraLax daily and pnr Dulcolax suppository q12hr     

## 2013-09-12 NOTE — Assessment & Plan Note (Signed)
Controlled on Lasix 20mg , update CMP

## 2013-09-12 NOTE — Progress Notes (Signed)
Patient ID: [name], male   DOB: 03/21/1919, 79 y.o.   MRN: [id number]  Code Status: DNR  Allergies  Allergen Reactions  . Sulfonamide Derivatives Rash    REACTION: rash    Chief Complaint  Patient presents with  . Medical Managment of Chronic Issues    HPI: Patient is a 79 y.o. male seen in the SNF at [facility] today for evaluation s/p hospitalization 08/09/13-08/13/13 for acute cholecystitis/cholangitis. The biliary drain inserted which per surgery needs to stay for at least 6 weeks prior to reevaluation for removal. Will continue antibiotics, Augmentin for 2 weeks on discharge-completed. Other chronic medical conditions.   Problem List Items Addressed This Visit   Acute cholecystitis     The CT abdomen 06/29/13 shows intra-and extrahepatic bililary dilatation. - was briefly on empiric IV Unasyn. - after anticoagulation was reversed with Vitamin K, he underwent ERCP with sphincterotomy and successful stone extraction. -CLinically asymptomatic and improved since, LFTS improved, patient declines cholecystectomy while in hospital and will Fu with Dr.Ingram. S/p hospitalization 08/09/13-08/13/13. Inserted biliary drain witch per sugery needs to say for at least 6 weeks prior to re evaluation for removal.         CVA (cerebral vascular accident)     Right sided weakness with right wrist/hand contracture. Grip strength is still 5/5. Ambulates with walker and w/c to go further.       GERD     Takes Omeprazole 20mg  and Ranitidine 150mg            HYPERTENSION     Controlled on Lasix 20mg , update CMP      Leukocytosis, unspecified     Wbc was 17.7 08/16/13--update CBC    Long term current use of anticoagulant     Therapeutic presently. INR 2.7 09/12/13, he takes Coumadin 3mg  and 4mg  alternating dose. Hx of CVA and Afib.       Protein-calorie malnutrition, severe     Contributes to his weight loss. Check TSH    Unspecified constipation     Stable with MiraLax daily  and pnr Dulcolax suppository q12hr      RESOLVED: WEIGHT GAIN, ABNORMAL - Primary     Gradual weight loss--#149 07/2013-147 08/2013-144 08/2013. Currently he states his appetite is good and takes dietary supplement. Will continue to monitor weight.        Review of Systems:  Review of Systems  Constitutional: Positive for weight loss. Negative for fever, chills, malaise/fatigue and diaphoresis.       Generalized weakness and the right sided weakness from previous CVA  HENT: Positive for hearing loss. Negative for congestion, ear discharge, ear pain, nosebleeds, sore throat and tinnitus.   Eyes: Negative for blurred vision, double vision, photophobia, pain, discharge and redness.       Left eye blindness  Respiratory: Negative for cough, hemoptysis, sputum production, shortness of breath, wheezing and stridor.   Cardiovascular: Negative for chest pain, palpitations, orthopnea, claudication, leg swelling and PND.  Gastrointestinal: Negative for heartburn, nausea, vomiting, abdominal pain, diarrhea, constipation, blood in stool and melena.  Genitourinary: Negative for dysuria, urgency, frequency, hematuria and flank pain.  Musculoskeletal: Negative for back pain, falls, joint pain, myalgias and neck pain.  Skin: Negative for itching and rash.       Right cholecystostomy.   Neurological: Positive for focal weakness and weakness. Negative for dizziness, tingling, tremors, sensory change, speech change, seizures, loss of consciousness and headaches.       Right sided weakness from previous  CVA even if grip strength is 5/5. The right wrist and hand contracture. Ambulates with walker and w/c to go further.   Endo/Heme/Allergies: Negative for environmental allergies and polydipsia. Bruises/bleeds easily.  Psychiatric/Behavioral: Negative for depression, suicidal ideas, hallucinations, memory loss and substance abuse. The patient is not nervous/anxious and does not have insomnia.      Past Medical  History  Diagnosis Date  . GERD (gastroesophageal reflux disease)   . Hypertension   . Hearing loss     wears hearing aid - right side  . Leg swelling   . Trouble swallowing   . Bruises easily     due to coumadin  . Weakness     difficulty walking  . Contact lens/glasses fitting   . Stroke 1999  . Cancer   . 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 Gait disturbance   . Dyslipidemia   . COPD (chronic obstructive pulmonary disease)   . Peripheral vascular disease   . History of melanoma   . Diverticulosis   . Abnormality of gait 05/16/2013  . Choledocholithiasis with obstruction 06/28/13  . Embolic stroke   . PAF (paroxysmal atrial fibrillation)   . Hyperlipidemia   . Bradycardia   . Protein-calorie malnutrition, severe   . Ventricular tachycardia (paroxysmal)   . Obstructive sleep apnea of adult     uses cpap, pt does not know setting  . Personal history of fall 05/29/2013  . Fracture of femoral neck, right, closed 3114  . Long term (current) use of anticoagulants     PAF and embolic CVA  . Anemia, unspecified 07/05/2013  . Xerophthalmia 07/05/2013  . 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 Actinic keratosis   . Seborrheic keratosis    Past Surgical History  Procedure Laterality Date  . Excision of melanoma  2009    left leg  . Melanoma excision      many melanoma removed in past  . Melanoma excision  10/11/2011    Procedure: MELANOMA EXCISION;  Surgeon: Valarie Merino, MD;  Location: [facility] OR;  Service: General;  Laterality: Left;  EXCISION melanoma left leg with full thickness skin grafting from left lower abdomen.  . Joint replacement  2012    r fem head fx  . Lung benign area removed   1970's  . Hernia repair  1983  . Melanoma excision  05/16/2012    Procedure: MELANOMA EXCISION;  Surgeon: Valarie Merino, MD;  Location: [facility];  Service: General;  Laterality: Left;  Nodule Removal of Melanoma on Left Shin  . Ercp N/A 07/04/2013    Procedure: ENDOSCOPIC RETROGRADE CHOLANGIOPANCREATOGRAPHY  (ERCP);  Surgeon: Hilarie Fredrickson, MD;  Location: [facility];  Service: Endoscopy;  Laterality: N/A;   Social History:   reports that he quit smoking about 49 years ago. His smoking use included Pipe. He has never used smokeless tobacco. He reports that he does not drink alcohol or use illicit drugs.  Family History  Problem Relation Age of Onset  . Heart disease Father 74  . Cancer Son     prostate  . Dementia Sister     One sister has dementia    Medications: Patient's Medications  New Prescriptions   No medications on file  Previous Medications   ACETAMINOPHEN (TYLENOL) 325 MG TABLET    Take 650 mg by mouth every 6 (six) hours as needed for pain.    ALUM & MAG HYDROXIDE-SIMETH (MAALOX/MYLANTA) 200-200-20 MG/5ML SUSPENSION    Take 30 mLs by mouth  every 6 (six) hours as needed.   AMOXICILLIN-CLAVULANATE (AUGMENTIN) 875-125 MG PER TABLET    Take 1 tablet by mouth 2 (two) times daily.   BISACODYL (DULCOLAX) 10 MG SUPPOSITORY    Place 1 suppository (10 mg total) rectally every 12 (twelve) hours as needed.   FEEDING SUPPLEMENT (ENSURE COMPLETE) LIQD    Take 237 mLs by mouth 2 (two) times daily between meals.   FUROSEMIDE (LASIX) 20 MG TABLET    20 mg every other day.    HYDROXYPROPYL METHYLCELLULOSE (ISOPTO TEARS) 2.5 % OPHTHALMIC SOLUTION    Place 1 drop into both eyes 4 (four) times daily as needed (eye irritation).    MECLIZINE (ANTIVERT) 25 MG TABLET    Take 25 mg by mouth 3 (three) times daily as needed for dizziness.    OMEPRAZOLE (PRILOSEC) 20 MG CAPSULE    Take 20 mg by mouth daily.   POLYETHYLENE GLYCOL (MIRALAX / GLYCOLAX) PACKET    Take 17 g by mouth 2 (two) times daily.   RANITIDINE (ZANTAC) 150 MG TABLET    Take 150 mg by mouth at bedtime.   WARFARIN (COUMADIN) 3 MG TABLET    Take 1 tablet (3 mg total) by mouth one time only at 6 PM.  Modified Medications   No medications on file  Discontinued Medications   No medications on file     Physical Exam: Physical Exam   Constitutional: He is oriented to person, place, and time. He appears well-developed and well-nourished. No distress.  HENT:  Head: Normocephalic and atraumatic.  Right Ear: External ear normal.  Left Ear: External ear normal.  Nose: Nose normal.  Mouth/Throat: Oropharynx is clear and moist. No oropharyngeal exudate.  Eyes: Conjunctivae are normal. Pupils are equal, round, and reactive to light. Right eye exhibits no discharge. No scleral icterus.  Left eye blindness  Neck: Normal range of motion. Neck supple. No JVD present. No tracheal deviation present. No thyromegaly present.  Cardiovascular: Normal rate and normal heart sounds.  An irregular rhythm present.  Pulmonary/Chest: Effort normal and breath sounds normal. No stridor. No respiratory distress. He has no wheezes. He has no rales. He exhibits no tenderness.  Abdominal: He exhibits no distension. There is no tenderness. There is no rebound and no guarding.  Musculoskeletal: Normal range of motion. He exhibits no edema and no tenderness.  The right sided weakness since previous CVA-muscle strength is still 5/5. The right wrist and hand contractures-able to make fist and cannot extend fully. Ambulates with walker and w/c to go further.   Lymphadenopathy:    He has no cervical adenopathy.  Neurological: He is alert and oriented to person, place, and time. He displays abnormal reflex. No cranial nerve deficit. Coordination normal.  Skin: Skin is warm and dry. No rash noted. He is not diaphoretic. No erythema. No pallor.  The right cholecystostomy. The biliary drain is functional.   Psychiatric: Thought content normal. His mood appears anxious. His affect is not angry and not inappropriate. His speech is not rapid and/or pressured, not delayed and not slurred. He is agitated. He is not aggressive, not hyperactive, not slowed and not withdrawn. Cognition and memory are impaired. He expresses impulsivity and inappropriate judgment. He does  not exhibit a depressed mood. He exhibits abnormal recent memory. He exhibits normal remote memory.    Filed Vitals:   09/12/13 1341  BP: 140/82  Pulse: 68  Temp: 97.8 F (36.6 C)  TempSrc: Tympanic  Resp: 20  Labs reviewed: Basic Metabolic Panel:  Recent Labs  16/10/96 1820  08/10/13 0805 08/11/13 0405 08/13/13 0425  NA  --   < > 133* 133* 137  K  --   < > 3.0* 3.6 3.3*  CL  --   < > 99 103 106  CO2  --   < > 24 23 24   GLUCOSE  --   < > 91 136* 98  BUN  --   < > 43* 32* 19  CREATININE  --   < > 1.31 1.23 1.09  CALCIUM  --   < > 7.8* 7.8* 8.2*  MG 1.8  --   --   --   --   < > = values in this interval not displayed. Liver Function Tests:  Recent Labs  07/05/13 0417 08/09/13 1749 08/10/13 0805  AST 43* 28 15  ALT 43 22 15  ALKPHOS 570* 259* 194*  BILITOT 1.6* 1.0 0.8  PROT 5.5* 6.6 5.2*  ALBUMIN 2.2* 2.5* 1.8*    Recent Labs  06/28/13 2114 08/09/13 1805  LIPASE 18 13   CBC:  Recent Labs  05/29/13 1616 06/28/13 2114  08/09/13 1749 08/10/13 0805 08/11/13 0405 08/13/13 0425 08/16/13  WBC 10.6* 24.5*  < > 23.0* 19.3* 18.7* 11.2* 17.7  NEUTROABS 9.0* 22.8*  --  20.3*  --   --   --   --   HGB 13.3 11.2*  < > 12.7* 10.8* 10.1* 11.3* 11.4*  HCT 40.3 34.3*  < > 38.6* 32.9* 31.0* 35.2* 35*  MCV 89.2 89.1  < > 88.3 89.2 90.1 91.0  --   PLT 340 345  < > 280 264 300 311 392  < > = values in this interval not displayed.  Past Procedures: 06/28/13 [place] abd  IMPRESSION:  1. Marked intra and extrahepatic biliary dilation. No obstructing  lesion or stone is identified. The common bile duct is dilated  into the pancreatic head.  2. Bilateral hyperechoic kidneys with thinning of the parenchyma.  This is compatible with nonspecific medical renal disease.  3. Single large gallstone without evidence for acute  cholecystitis.   06/29/13 2 D echocardiogram: EF 55%  06/29/13 MRI abd w/o [facility] Abdomen  08/09/2013 *RADIOLOGY REPORT* Clinical Data: Nausea and  fever. COMPLETE ABDOMINAL ULTRASOUND Comparison: [place] 06/28/2013. IMPRESSION: 1. Cholelithiasis and new gallbladder wall thickening. Although there is no sonographic Murphy's sign, findings could represent acute cholecystitis. Consider biliary scintigraphy if there is clinical uncertainty. 2. From July 2014, improved biliary ductal dilatation status post sphincterotomy and CBD stone extraction. Pneumobilia decreases sensitivity for detecting current choledocholithiasis.   08/11/2013 *RADIOLOGY REPORT* Indication: Acute on chronic cholecystitis, poor operative candidate. ULTRASOUND AND FLUOROSCOPIC-GUIDED CHOLECYSTOSTOMY TUBE PLACEMENT Comparisons: Abdominal ultrasound - 08/09/2013; MRCP - 06/29/2013; CT of the pelvis - 06/29/2013 Contrast Complications: Impression: 1. Successful ultrasound and fluoroscopic guided placement of a 10.2 French cholecystostomy tube. Again, a direct approach to the gallbladder was necessary for cholecystostomy placement secondary to lack of adequate sonographic window for transhepatic approach as well as the interposed colon and small bowel as was demonstrated on previously performed cross-sectional imaging. 2. A small sample of the aspirated purulent appearing bile was sent to the laboratory for analysis.   08/09/2013 *RADIOLOGY REPORT* Clinical Data: Fever, weakness PORTABLE CHEST - 1 VIEW Comparison: Prior chest x-ray 07/04/2013  IMPRESSION: Essentially stable chest x-ray without evidence of new acute cardiopulmonary process. Stable pleural thickening versus small right pleural effusion.   Assessment/PlanIMPRESSION:  1. Two obstructing stones  within the common bile duct and a large  stone at the confluence of the cystic duct and the common hepatic  duct. There is significant biliary obstruction t proximal to the  stones.  2. Single gallstone within the gallbladder. Cystic duct is also  dilated.  3. Multiple cysts throughout the pancreas suggest chronic  pancreatitis.  WEIGHT  GAIN, ABNORMAL Gradual weight loss--#149 07/2013-147 08/2013-144 08/2013. Currently he states his appetite is good and takes dietary supplement. Will continue to monitor weight.   Unspecified constipation Stable with MiraLax daily and pnr Dulcolax suppository q12hr    Protein-calorie malnutrition, severe Contributes to his weight loss. Check TSH  Long term current use of anticoagulant Therapeutic presently. INR 2.7 09/12/13, he takes Coumadin 3mg  and 4mg  alternating dose. Hx of CVA and Afib.     Leukocytosis, unspecified Wbc was 17.7 08/16/13--update CBC  HYPERTENSION Controlled on Lasix 20mg , update CMP    GERD Takes Omeprazole 20mg  and Ranitidine 150mg          CVA (cerebral vascular accident) Right sided weakness with right wrist/hand contracture. Grip strength is still 5/5. Ambulates with walker and w/c to go further.     Acute cholecystitis The CT abdomen 06/29/13 shows intra-and extrahepatic bililary dilatation. - was briefly on empiric IV Unasyn. - after anticoagulation was reversed with Vitamin K, he underwent ERCP with sphincterotomy and successful stone extraction. -CLinically asymptomatic and improved since, LFTS improved, patient declines cholecystectomy while in hospital and will Fu with Dr.Ingram. S/p hospitalization 08/09/13-08/13/13. Inserted biliary drain witch per sugery needs to say for at least 6 weeks prior to re evaluation for removal.         Family/ Staff Communication: observe the patient.   Goals of Care: AL.  Labs/tests ordered: CBC and CMP and TSH

## 2013-09-13 LAB — BASIC METABOLIC PANEL WITH GFR
BUN: 19 mg/dL (ref 4–21)
Creatinine: 1 mg/dL (ref 0.6–1.3)
Glucose: 89 mg/dL
Potassium: 4.9 mmol/L (ref 3.4–5.3)
Sodium: 137 mmol/L (ref 137–147)

## 2013-09-13 LAB — CBC AND DIFFERENTIAL
HCT: 35 % — AB (ref 41–53)
Hemoglobin: 11.5 g/dL — AB (ref 13.5–17.5)
Platelets: 336 10*3/uL (ref 150–399)
WBC: 5.2 10*3/mL

## 2013-09-13 LAB — TSH: TSH: 2.34 u[IU]/mL (ref 0.41–5.90)

## 2013-09-13 LAB — HEPATIC FUNCTION PANEL: AST: 18 U/L (ref 14–40)

## 2013-09-19 ENCOUNTER — Encounter (INDEPENDENT_AMBULATORY_CARE_PROVIDER_SITE_OTHER): Payer: Self-pay

## 2013-09-19 ENCOUNTER — Encounter (INDEPENDENT_AMBULATORY_CARE_PROVIDER_SITE_OTHER): Payer: Self-pay | Admitting: General Surgery

## 2013-09-19 ENCOUNTER — Ambulatory Visit (INDEPENDENT_AMBULATORY_CARE_PROVIDER_SITE_OTHER): Payer: Medicare Other | Admitting: General Surgery

## 2013-09-19 VITALS — BP 124/82 | HR 70 | Temp 97.8°F | Resp 14 | Ht 63.0 in | Wt 149.0 lb

## 2013-09-19 DIAGNOSIS — K8000 Calculus of gallbladder with acute cholecystitis without obstruction: Secondary | ICD-10-CM

## 2013-09-19 NOTE — Progress Notes (Signed)
Patient ID: [name], male   DOB: Feb 18, 1919, 79 y.o.   MRN: [id number]  No chief complaint on file.   HPI [name] is a 79 y.o. male.   HPI  He presents for a followup visit after his hospitalization for acute cholecystitis in September. He was treated with antibiotics and a percutaneous cholecystostomy tube because of his significant comorbidities. He has a good appetite. He has no abdominal pain.  Past Medical History  Diagnosis Date  . GERD (gastroesophageal reflux disease)   . Hypertension   . Hearing loss     wears hearing aid - right side  . Leg swelling   . Trouble swallowing   . Bruises easily     due to coumadin  . Weakness     difficulty walking  . Contact lens/glasses fitting   . Stroke 1999  . Cancer   . 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 Gait disturbance   . Dyslipidemia   . COPD (chronic obstructive pulmonary disease)   . Peripheral vascular disease   . History of melanoma   . Diverticulosis   . Abnormality of gait 05/16/2013  . Choledocholithiasis with obstruction 06/28/13  . Embolic stroke   . PAF (paroxysmal atrial fibrillation)   . Hyperlipidemia   . Bradycardia   . Protein-calorie malnutrition, severe   . Ventricular tachycardia (paroxysmal)   . Obstructive sleep apnea of adult     uses cpap, pt does not know setting  . Personal history of fall 05/29/2013  . Fracture of femoral neck, right, closed 3114  . Long term (current) use of anticoagulants     PAF and embolic CVA  . Anemia, unspecified 07/05/2013  . Xerophthalmia 07/05/2013  . 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 Actinic keratosis   . Seborrheic keratosis     Past Surgical History  Procedure Laterality Date  . Excision of melanoma  2009    left leg  . Melanoma excision      many melanoma removed in past  . Melanoma excision  10/11/2011    Procedure: MELANOMA EXCISION;  Surgeon: Valarie Merino, MD;  Location: [facility];  Service: General;  Laterality: Left;  EXCISION melanoma left leg with full  thickness skin grafting from left lower abdomen.  . Joint replacement  2012    r fem head fx  . Lung benign area removed   1970's  . Hernia repair  1983  . Melanoma excision  05/16/2012    Procedure: MELANOMA EXCISION;  Surgeon: Valarie Merino, MD;  Location: [facility];  Service: General;  Laterality: Left;  Nodule Removal of Melanoma on Left Shin  . Ercp N/A 07/04/2013    Procedure: ENDOSCOPIC RETROGRADE CHOLANGIOPANCREATOGRAPHY (ERCP);  Surgeon: Hilarie Fredrickson, MD;  Location: [facility];  Service: Endoscopy;  Laterality: N/A;    Family History  Problem Relation Age of Onset  . Heart disease Father 25  . Cancer Son     prostate  . Dementia Sister     One sister has dementia    Social History History  Substance Use Topics  . Smoking status: Former Smoker    Types: Pipe    Quit date: 08/31/1964  . Smokeless tobacco: Never Used  . Alcohol Use: No    Allergies  Allergen Reactions  . Sulfonamide Derivatives Rash    REACTION: rash    Current Outpatient Prescriptions  Medication Sig Dispense Refill  . acetaminophen (TYLENOL) 325 MG tablet Take 650 mg by mouth every  6 (six) hours as needed for pain.       Marland Kitchen alum & mag hydroxide-simeth (MAALOX/MYLANTA) 200-200-20 MG/5ML suspension Take 30 mLs by mouth every 6 (six) hours as needed.  355 mL  0  . bisacodyl (DULCOLAX) 10 MG suppository Place 1 suppository (10 mg total) rectally every 12 (twelve) hours as needed.  12 suppository  0  . feeding supplement (ENSURE COMPLETE) LIQD Take 237 mLs by mouth 2 (two) times daily between meals.  10 Bottle  1  . furosemide (LASIX) 20 MG tablet 20 mg every other day.       . hydroxypropyl methylcellulose (ISOPTO TEARS) 2.5 % ophthalmic solution Place 1 drop into both eyes 4 (four) times daily as needed (eye irritation).       . meclizine (ANTIVERT) 25 MG tablet Take 25 mg by mouth 3 (three) times daily as needed for dizziness.       Marland Kitchen omeprazole (PRILOSEC) 20 MG capsule Take 20 mg by mouth daily.       . polyethylene glycol (MIRALAX / GLYCOLAX) packet Take 17 g by mouth 2 (two) times daily.  14 each  0  . ranitidine (ZANTAC) 150 MG tablet Take 150 mg by mouth at bedtime.      Marland Kitchen warfarin (COUMADIN) 3 MG tablet Take 1 tablet (3 mg total) by mouth one time only at 6 PM.  30 tablet  0  . amoxicillin-clavulanate (AUGMENTIN) 875-125 MG per tablet Take 1 tablet by mouth 2 (two) times daily.  28 tablet  0   No current facility-administered medications for this visit.    Review of Systems Review of Systems  HENT: Positive for hearing loss.   Gastrointestinal: Positive for abdominal pain.    Blood pressure 124/82, pulse 70, temperature 97.8 F (36.6 C), resp. rate 14, height 5\' 3"  (1.6 m), weight 149 lb (67.586 kg).  Physical Exam Physical Exam  Constitutional:  Elderly male in a wheelchair.  HENT:  Bilateral hearing aids are present.  Abdominal: Soft. There is no tenderness.  Cholecystostomy tube present and right upper quadrant. Clear to light green drainage coming from it.    Data Reviewed Hospital notes. Note from nursing home.  Assessment    Acute cholecystitis treated adequately with percutaneous cholecystostomy and oral antibiotics. I feel he needs to have a draining for 2 more weeks before removal. We discussed the option of cholecystectomy versus expected management and he favors expected management at this time which I am in agreement with given his other comorbidities.     Plan    Will arrange for the percutaneous cholecystostomy tube to be removed in radiology in approximately 2 weeks. Followup as needed.        Hektor Huston J 09/19/2013, 9:25 AM

## 2013-09-19 NOTE — Patient Instructions (Signed)
We will arrange to have the tube removed.

## 2013-09-20 ENCOUNTER — Other Ambulatory Visit (INDEPENDENT_AMBULATORY_CARE_PROVIDER_SITE_OTHER): Payer: Self-pay | Admitting: General Surgery

## 2013-09-21 ENCOUNTER — Other Ambulatory Visit (INDEPENDENT_AMBULATORY_CARE_PROVIDER_SITE_OTHER): Payer: Self-pay | Admitting: General Surgery

## 2013-09-24 ENCOUNTER — Telehealth (INDEPENDENT_AMBULATORY_CARE_PROVIDER_SITE_OTHER): Payer: Self-pay | Admitting: *Deleted

## 2013-09-24 NOTE — Telephone Encounter (Signed)
[facility] gave her pts appt info for his chole drain pull at [facility].  Appt is on 10/03/13 with an arrival time of 8:30am.  Carney Bern stated patient would be able to sign his own consent form.  However, I instructed Carney Bern that if patient is unable to sign consent that his POA would need to go with him to sign for him.

## 2013-09-25 ENCOUNTER — Encounter (INDEPENDENT_AMBULATORY_CARE_PROVIDER_SITE_OTHER): Payer: Medicare Other | Admitting: General Surgery

## 2013-09-28 ENCOUNTER — Encounter (INDEPENDENT_AMBULATORY_CARE_PROVIDER_SITE_OTHER): Payer: Medicare Other | Admitting: General Surgery

## 2013-10-02 ENCOUNTER — Encounter (HOSPITAL_COMMUNITY): Payer: Self-pay | Admitting: Pharmacy Technician

## 2013-10-02 ENCOUNTER — Encounter (INDEPENDENT_AMBULATORY_CARE_PROVIDER_SITE_OTHER): Payer: Medicare Other | Admitting: General Surgery

## 2013-10-03 ENCOUNTER — Ambulatory Visit (HOSPITAL_COMMUNITY)
Admission: RE | Admit: 2013-10-03 | Discharge: 2013-10-03 | Disposition: A | Discharge status: Home / Self Care | Payer: Medicare Other | Source: Ambulatory Visit | Attending: General Surgery | Admitting: General Surgery

## 2013-10-03 DIAGNOSIS — Z4682 Encounter for fitting and adjustment of non-vascular catheter: Secondary | ICD-10-CM | POA: Insufficient documentation

## 2013-10-03 DIAGNOSIS — R932 Abnormal findings on diagnostic imaging of liver and biliary tract: Secondary | ICD-10-CM | POA: Insufficient documentation

## 2013-10-03 DIAGNOSIS — K8000 Calculus of gallbladder with acute cholecystitis without obstruction: Secondary | ICD-10-CM

## 2013-10-03 MED ORDER — IOHEXOL 300 MG/ML  SOLN
50.0000 mL | Freq: Once | INTRAMUSCULAR | Status: AC | PRN
  Administered 2013-10-03: 10 mL

## 2013-10-17 ENCOUNTER — Encounter: Payer: Self-pay | Admitting: Nurse Practitioner

## 2013-10-17 ENCOUNTER — Non-Acute Institutional Stay (SKILLED_NURSING_FACILITY): Payer: Medicare Other | Admitting: Nurse Practitioner

## 2013-10-17 DIAGNOSIS — D72829 Elevated white blood cell count, unspecified: Secondary | ICD-10-CM

## 2013-10-17 DIAGNOSIS — K219 Gastro-esophageal reflux disease without esophagitis: Secondary | ICD-10-CM

## 2013-10-17 DIAGNOSIS — I4891 Unspecified atrial fibrillation: Secondary | ICD-10-CM

## 2013-10-17 DIAGNOSIS — I639 Cerebral infarction, unspecified: Secondary | ICD-10-CM

## 2013-10-17 DIAGNOSIS — K5900 Constipation, unspecified: Secondary | ICD-10-CM

## 2013-10-17 DIAGNOSIS — D638 Anemia in other chronic diseases classified elsewhere: Secondary | ICD-10-CM

## 2013-10-17 DIAGNOSIS — K810 Acute cholecystitis: Secondary | ICD-10-CM

## 2013-10-17 DIAGNOSIS — E871 Hypo-osmolality and hyponatremia: Secondary | ICD-10-CM

## 2013-10-17 DIAGNOSIS — N179 Acute kidney failure, unspecified: Secondary | ICD-10-CM

## 2013-10-17 DIAGNOSIS — I6350 Cerebral infarction due to unspecified occlusion or stenosis of unspecified cerebral artery: Secondary | ICD-10-CM

## 2013-10-17 DIAGNOSIS — E876 Hypokalemia: Secondary | ICD-10-CM

## 2013-10-17 DIAGNOSIS — I10 Essential (primary) hypertension: Secondary | ICD-10-CM

## 2013-10-17 NOTE — Assessment & Plan Note (Signed)
Wbc was 17.7 08/16/13--resolved. Wbc 5.2 09/13/13

## 2013-10-17 NOTE — Progress Notes (Signed)
Patient ID: [name], male   DOB: [birth year], 79 y.o.   MRN: [id number]  Code Status: DNR  Allergies  Allergen Reactions  . Sulfonamide Derivatives Rash    REACTION: rash    Chief Complaint  Patient presents with  . Medical Managment of Chronic Issues  . Discharge Note    HPI: Patient is a 79 y.o. male seen in the SNF at [facility] today for evaluation s/p hospitalization 08/09/13-08/13/13 for acute cholecystitis/cholangitis. The biliary drain was removed which per surgery needs for 6 weeks. He as been clinically stable and re-grain his strength and ADL function as prior hospitalization.   Problem List Items Addressed This Visit   Acute cholecystitis - Primary     The CT abdomen 06/29/13 shows intra-and extrahepatic bililary dilatation. - was briefly on empiric IV Unasyn. - after anticoagulation was reversed with Vitamin K, he underwent ERCP with sphincterotomy and successful stone extraction. -CLinically asymptomatic and improved since, LFTS improved except alk phos 162 09/13/13, patient declines cholecystectomy while in hospital and will Fu with Dr.Ingram. S/p hospitalization 08/09/13-08/13/13. Removed after 6 weeks of  biliary drain. He is stonger and has returned to his baseline ADL function. May return to AL          Acute renal failure     Resolved. Bun/creat 19/0.96 09/13/13    Anemia of chronic disease     Stable with Hgb 11.5 09/13/13     Atrial fibrillation     Rate controlled without rhythm agent. Chronic anticoagulation therapy with Coumadin for risk reduction of thromboembolic event        CVA (cerebral vascular accident)     Right sided weakness with right wrist/hand contracture. Grip strength is still 5/5. Ambulates with walker and w/c to go further.         GERD     Takes Omeprazole 20mg  and Ranitidine 150mg              HYPERTENSION     Controlled on Lasix 20mg         Hypokalemia     Resolved. Serum K 4.9 09/13/13    Hyponatremia      Resolved. Serum Na 137 09/13/13    Leukocytosis, unspecified     Wbc was 17.7 08/16/13--resolved. Wbc 5.2 09/13/13      Unspecified constipation     Stable with MiraLax daily and pnr Dulcolax suppository q12hr           Review of Systems:  Review of Systems  Constitutional: Positive for weight loss. Negative for fever, chills, malaise/fatigue and diaphoresis.       Generalized weakness and the right sided weakness from previous CVA  HENT: Positive for hearing loss. Negative for congestion, ear discharge, ear pain, nosebleeds, sore throat and tinnitus.   Eyes: Negative for blurred vision, double vision, photophobia, pain, discharge and redness.       Left eye blindness  Respiratory: Negative for cough, hemoptysis, sputum production, shortness of breath, wheezing and stridor.   Cardiovascular: Negative for chest pain, palpitations, orthopnea, claudication, leg swelling and PND.  Gastrointestinal: Negative for heartburn, nausea, vomiting, abdominal pain, diarrhea, constipation, blood in stool and melena.  Genitourinary: Negative for dysuria, urgency, frequency, hematuria and flank pain.  Musculoskeletal: Negative for back pain, falls, joint pain, myalgias and neck pain.  Skin: Negative for itching and rash.       Right cholecystostomy-drain removed and site healed.   Neurological: Positive for focal weakness and weakness. Negative for dizziness, tingling,  tremors, sensory change, speech change, seizures, loss of consciousness and headaches.       Right sided weakness from previous CVA even if grip strength is 5/5. The right wrist and hand contracture. Ambulates with walker and w/c to go further.   Endo/Heme/Allergies: Negative for environmental allergies and polydipsia. Bruises/bleeds easily.  Psychiatric/Behavioral: Negative for depression, suicidal ideas, hallucinations, memory loss and substance abuse. The patient is not nervous/anxious and does not have insomnia.      Past  Medical History  Diagnosis Date  . GERD (gastroesophageal reflux disease)   . Hypertension   . Hearing loss     wears hearing aid - right side  . Leg swelling   . Trouble swallowing   . Bruises easily     due to coumadin  . Weakness     difficulty walking  . Contact lens/glasses fitting   . Stroke 1999  . Cancer   . 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 Gait disturbance   . Dyslipidemia   . COPD (chronic obstructive pulmonary disease)   . Peripheral vascular disease   . History of melanoma   . Diverticulosis   . Abnormality of gait 05/16/2013  . Choledocholithiasis with obstruction 06/28/13  . Embolic stroke   . PAF (paroxysmal atrial fibrillation)   . Hyperlipidemia   . Bradycardia   . Protein-calorie malnutrition, severe   . Ventricular tachycardia (paroxysmal)   . Obstructive sleep apnea of adult     uses cpap, pt does not know setting  . Personal history of fall 05/29/2013  . Fracture of femoral neck, right, closed 3114  . Long term (current) use of anticoagulants     PAF and embolic CVA  . Anemia, unspecified 07/05/2013  . Xerophthalmia 07/05/2013  . 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 Actinic keratosis   . Seborrheic keratosis    Past Surgical History  Procedure Laterality Date  . Excision of melanoma  2009    left leg  . Melanoma excision      many melanoma removed in past  . Melanoma excision  10/11/2011    Procedure: MELANOMA EXCISION;  Surgeon: Valarie Merino, MD;  Location: [facility] OR;  Service: General;  Laterality: Left;  EXCISION melanoma left leg with full thickness skin grafting from left lower abdomen.  . Joint replacement  2012    r fem head fx  . Lung benign area removed   1970's  . Hernia repair  1983  . Melanoma excision  05/16/2012    Procedure: MELANOMA EXCISION;  Surgeon: Valarie Merino, MD;  Location: [facility];  Service: General;  Laterality: Left;  Nodule Removal of Melanoma on Left Shin  . Ercp N/A 07/04/2013    Procedure: ENDOSCOPIC RETROGRADE  CHOLANGIOPANCREATOGRAPHY (ERCP);  Surgeon: Hilarie Fredrickson, MD;  Location: [facility];  Service: Endoscopy;  Laterality: N/A;   Social History:   reports that he quit smoking about 49 years ago. His smoking use included Pipe. He has never used smokeless tobacco. He reports that he does not drink alcohol or use illicit drugs.  Family History  Problem Relation Age of Onset  . Heart disease Father 55  . Cancer Son     prostate  . Dementia Sister     One sister has dementia    Medications: Patient's Medications  New Prescriptions   No medications on file  Previous Medications   ACETAMINOPHEN (TYLENOL) 325 MG TABLET    Take 650 mg by mouth every 6 (six) hours as  needed for pain.    ALUM & MAG HYDROXIDE-SIMETH (MAALOX/MYLANTA) 200-200-20 MG/5ML SUSPENSION    Take 30 mLs by mouth every 6 (six) hours as needed.   BISACODYL (DULCOLAX) 10 MG SUPPOSITORY    Place 1 suppository (10 mg total) rectally every 12 (twelve) hours as needed.   FEEDING SUPPLEMENT (ENSURE COMPLETE) LIQD    Take 237 mLs by mouth 2 (two) times daily between meals.   FUROSEMIDE (LASIX) 20 MG TABLET    20 mg every other day.    HYDROCODONE-ACETAMINOPHEN (NORCO/VICODIN) 5-325 MG PER TABLET    Take 1 tablet by mouth every 6 (six) hours as needed for pain.   HYDROXYPROPYL METHYLCELLULOSE (ISOPTO TEARS) 2.5 % OPHTHALMIC SOLUTION    Place 1 drop into both eyes 4 (four) times daily as needed (eye irritation).    MECLIZINE (ANTIVERT) 25 MG TABLET    Take 25 mg by mouth 3 (three) times daily as needed for dizziness.    OMEPRAZOLE (PRILOSEC) 20 MG CAPSULE    Take 20 mg by mouth daily.   POLYETHYLENE GLYCOL (MIRALAX / GLYCOLAX) PACKET    Take 17 g by mouth 2 (two) times daily.   RANITIDINE (ZANTAC) 150 MG TABLET    Take 150 mg by mouth at bedtime.   WARFARIN (COUMADIN) 3 MG TABLET    Take 3 mg by mouth every other day. Alternate with 4mg  tablet.   WARFARIN (COUMADIN) 4 MG TABLET    Take 4 mg by mouth every other day. Alternate with 3mg   tablet.  Modified Medications   No medications on file  Discontinued Medications   No medications on file     Physical Exam: Physical Exam  Constitutional: He is oriented to person, place, and time. He appears well-developed and well-nourished. No distress.  HENT:  Head: Normocephalic and atraumatic.  Right Ear: External ear normal.  Left Ear: External ear normal.  Nose: Nose normal.  Mouth/Throat: Oropharynx is clear and moist. No oropharyngeal exudate.  Eyes: Conjunctivae are normal. Pupils are equal, round, and reactive to light. Right eye exhibits no discharge. No scleral icterus.  Left eye blindness  Neck: Normal range of motion. Neck supple. No JVD present. No tracheal deviation present. No thyromegaly present.  Cardiovascular: Normal rate and normal heart sounds.  An irregular rhythm present.  Pulmonary/Chest: Effort normal and breath sounds normal. No stridor. No respiratory distress. He has no wheezes. He has no rales. He exhibits no tenderness.  Abdominal: He exhibits no distension. There is no tenderness. There is no rebound and no guarding.  Musculoskeletal: Normal range of motion. He exhibits no edema and no tenderness.  The right sided weakness since previous CVA-muscle strength is still 5/5. The right wrist and hand contractures-able to make fist and cannot extend fully. Ambulates with walker and w/c to go further.   Lymphadenopathy:    He has no cervical adenopathy.  Neurological: He is alert and oriented to person, place, and time. He displays abnormal reflex. No cranial nerve deficit. Coordination normal.  Skin: Skin is warm and dry. No rash noted. He is not diaphoretic. No erythema. No pallor.  The right cholecystostomy. The biliary drain has removed and the site is healed.   Psychiatric: Thought content normal. His mood appears anxious. His affect is not angry and not inappropriate. His speech is not rapid and/or pressured, not delayed and not slurred. He is  agitated. He is not aggressive, not hyperactive, not slowed and not withdrawn. Cognition and memory are impaired. He expresses impulsivity  and inappropriate judgment. He does not exhibit a depressed mood. He exhibits abnormal recent memory. He exhibits normal remote memory.    Filed Vitals:   10/17/13 1323  BP: 122/78  Pulse: 80  Temp: 98 F (36.7 C)  TempSrc: Tympanic  Resp: 18      Labs reviewed: Basic Metabolic Panel:  Recent Labs  16/10/96 1820  08/10/13 0805 08/11/13 0405 08/13/13 0425 09/13/13  NA  --   < > 133* 133* 137 137  K  --   < > 3.0* 3.6 3.3* 4.9  CL  --   < > 99 103 106  --   CO2  --   < > 24 23 24   --   GLUCOSE  --   < > 91 136* 98  --   BUN  --   < > 43* 32* 19 19  CREATININE  --   < > 1.31 1.23 1.09 1.0  CALCIUM  --   < > 7.8* 7.8* 8.2*  --   MG 1.8  --   --   --   --   --   TSH  --   --   --   --   --  2.34  < > = values in this interval not displayed. Liver Function Tests:  Recent Labs  07/05/13 0417 08/09/13 1749 08/10/13 0805 09/13/13  AST 43* 28 15 18   ALT 43 22 15 12   ALKPHOS 570* 259* 194* 162*  BILITOT 1.6* 1.0 0.8  --   PROT 5.5* 6.6 5.2*  --   ALBUMIN 2.2* 2.5* 1.8*  --     Recent Labs  06/28/13 2114 08/09/13 1805  LIPASE 18 13   CBC:  Recent Labs  05/29/13 1616 06/28/13 2114  08/09/13 1749 08/10/13 0805 08/11/13 0405 08/13/13 0425 08/16/13 09/13/13  WBC 10.6* 24.5*  < > 23.0* 19.3* 18.7* 11.2* 17.7 5.2  NEUTROABS 9.0* 22.8*  --  20.3*  --   --   --   --   --   HGB 13.3 11.2*  < > 12.7* 10.8* 10.1* 11.3* 11.4* 11.5*  HCT 40.3 34.3*  < > 38.6* 32.9* 31.0* 35.2* 35* 35*  MCV 89.2 89.1  < > 88.3 89.2 90.1 91.0  --   --   PLT 340 345  < > 280 264 300 311 392 336  < > = values in this interval not displayed.  Past Procedures: 06/28/13 [place] abd  IMPRESSION:  1. Marked intra and extrahepatic biliary dilation. No obstructing  lesion or stone is identified. The common bile duct is dilated  into the pancreatic head.  2.  Bilateral hyperechoic kidneys with thinning of the parenchyma.  This is compatible with nonspecific medical renal disease.  3. Single large gallstone without evidence for acute  cholecystitis.   06/29/13 2 D echocardiogram: EF 55%  06/29/13 MRI abd w/o CMUs Abdomen  08/09/2013 *RADIOLOGY REPORT* Clinical Data: Nausea and fever. COMPLETE ABDOMINAL ULTRASOUND Comparison: [place] 06/28/2013. IMPRESSION: 1. Cholelithiasis and new gallbladder wall thickening. Although there is no sonographic Murphy's sign, findings could represent acute cholecystitis. Consider biliary scintigraphy if there is clinical uncertainty. 2. From July 2014, improved biliary ductal dilatation status post sphincterotomy and CBD stone extraction. Pneumobilia decreases sensitivity for detecting current choledocholithiasis.   08/11/2013 *RADIOLOGY REPORT* Indication: Acute on chronic cholecystitis, poor operative candidate. ULTRASOUND AND FLUOROSCOPIC-GUIDED CHOLECYSTOSTOMY TUBE PLACEMENT Comparisons: Abdominal ultrasound - 08/09/2013; MRCP - 06/29/2013; CT of the pelvis - 06/29/2013 Contrast Complications: Impression: 1. Successful ultrasound  and fluoroscopic guided placement of a 10.2 French cholecystostomy tube. Again, a direct approach to the gallbladder was necessary for cholecystostomy placement secondary to lack of adequate sonographic window for transhepatic approach as well as the interposed colon and small bowel as was demonstrated on previously performed cross-sectional imaging. 2. A small sample of the aspirated purulent appearing bile was sent to the laboratory for analysis.   08/09/2013 *RADIOLOGY REPORT* Clinical Data: Fever, weakness PORTABLE CHEST - 1 VIEW Comparison: Prior chest x-ray 07/04/2013  IMPRESSION: Essentially stable chest x-ray without evidence of new acute cardiopulmonary process. Stable pleural thickening versus small right pleural effusion.   Assessment/PlanIMPRESSION:  1. Two obstructing stones within the common  bile duct and a large  stone at the confluence of the cystic duct and the common hepatic  duct. There is significant biliary obstruction t proximal to the  stones.  2. Single gallstone within the gallbladder. Cystic duct is also  dilated.  3. Multiple cysts throughout the pancreas suggest chronic  pancreatitis.  Acute cholecystitis The CT abdomen 06/29/13 shows intra-and extrahepatic bililary dilatation. - was briefly on empiric IV Unasyn. - after anticoagulation was reversed with Vitamin K, he underwent ERCP with sphincterotomy and successful stone extraction. -CLinically asymptomatic and improved since, LFTS improved except alk phos 162 09/13/13, patient declines cholecystectomy while in hospital and will Fu with Dr.Ingram. S/p hospitalization 08/09/13-08/13/13. Removed after 6 weeks of  biliary drain. He is stonger and has returned to his baseline ADL function. May return to AL        Acute renal failure Resolved. Bun/creat 19/0.96 09/13/13  Anemia of chronic disease Stable with Hgb 11.5 09/13/13   Atrial fibrillation Rate controlled without rhythm agent. Chronic anticoagulation therapy with Coumadin for risk reduction of thromboembolic event      CVA (cerebral vascular accident) Right sided weakness with right wrist/hand contracture. Grip strength is still 5/5. Ambulates with walker and w/c to go further.       GERD Takes Omeprazole 20mg  and Ranitidine 150mg            HYPERTENSION Controlled on Lasix 20mg       Hypokalemia Resolved. Serum K 4.9 09/13/13  Hyponatremia Resolved. Serum Na 137 09/13/13  Leukocytosis, unspecified Wbc was 17.7 08/16/13--resolved. Wbc 5.2 09/13/13    Unspecified constipation Stable with MiraLax daily and pnr Dulcolax suppository q12hr        Family/ Staff Communication: observe the patient.   Goals of Care: AL.  Labs/tests ordered: none

## 2013-10-17 NOTE — Assessment & Plan Note (Signed)
Stable with MiraLax daily and pnr Dulcolax suppository q12hr

## 2013-10-17 NOTE — Assessment & Plan Note (Signed)
Resolved. Serum K 4.9 09/13/13

## 2013-10-17 NOTE — Assessment & Plan Note (Signed)
Rate controlled without rhythm agent. Chronic anticoagulation therapy with Coumadin for risk reduction of thromboembolic event       

## 2013-10-17 NOTE — Assessment & Plan Note (Signed)
Takes Omeprazole 20mg  and Ranitidine 150mg 

## 2013-10-17 NOTE — Assessment & Plan Note (Signed)
Resolved. Bun/creat 19/0.96 09/13/13

## 2013-10-17 NOTE — Assessment & Plan Note (Signed)
Stable with Hgb 11.5 09/13/13

## 2013-10-17 NOTE — Assessment & Plan Note (Signed)
Controlled on Lasix. 20mg   

## 2013-10-17 NOTE — Assessment & Plan Note (Signed)
Resolved. Serum Na 137 09/13/13

## 2013-10-17 NOTE — Assessment & Plan Note (Signed)
Right sided weakness with right wrist/hand contracture. Grip strength is still 5/5. Ambulates with walker and w/c to go further.

## 2013-10-17 NOTE — Assessment & Plan Note (Signed)
The CT abdomen 06/29/13 shows intra-and extrahepatic bililary dilatation. - was briefly on empiric IV Unasyn. - after anticoagulation was reversed with Vitamin K, he underwent ERCP with sphincterotomy and successful stone extraction. -CLinically asymptomatic and improved since, LFTS improved except alk phos 162 09/13/13, patient declines cholecystectomy while in hospital and will Fu with Dr.Ingram. S/p hospitalization 08/09/13-08/13/13. Removed after 6 weeks of  biliary drain. He is stonger and has returned to his baseline ADL function. May return to AL

## 2013-11-20 LAB — PROTIME-INR: Protime: 23.6 seconds — AB (ref 10.0–13.8)

## 2013-11-20 LAB — POCT INR: INR: 2.2 — AB (ref 0.9–1.1)

## 2013-12-20 ENCOUNTER — Encounter: Payer: Self-pay | Admitting: Nurse Practitioner

## 2013-12-20 ENCOUNTER — Non-Acute Institutional Stay: Payer: Medicare Other | Admitting: Nurse Practitioner

## 2013-12-20 DIAGNOSIS — I6350 Cerebral infarction due to unspecified occlusion or stenosis of unspecified cerebral artery: Secondary | ICD-10-CM

## 2013-12-20 DIAGNOSIS — R609 Edema, unspecified: Secondary | ICD-10-CM | POA: Insufficient documentation

## 2013-12-20 DIAGNOSIS — I4891 Unspecified atrial fibrillation: Secondary | ICD-10-CM

## 2013-12-20 DIAGNOSIS — K219 Gastro-esophageal reflux disease without esophagitis: Secondary | ICD-10-CM

## 2013-12-20 DIAGNOSIS — I10 Essential (primary) hypertension: Secondary | ICD-10-CM

## 2013-12-20 DIAGNOSIS — K5900 Constipation, unspecified: Secondary | ICD-10-CM

## 2013-12-20 DIAGNOSIS — I639 Cerebral infarction, unspecified: Secondary | ICD-10-CM

## 2013-12-20 NOTE — Assessment & Plan Note (Signed)
Stable with MiraLax qod and pnr Dulcolax suppository q12hr

## 2013-12-20 NOTE — Assessment & Plan Note (Signed)
Right sided weakness with right wrist/hand contracture. Grip strength is still 5/5. Ambulates with walker and w/c to go further. PT and OT to eval and tx for progressive RUE weakness.

## 2013-12-20 NOTE — Assessment & Plan Note (Addendum)
Trace pedal edema R>L. The patient stated he started wearing TED after the left lower leg melanoma removal surgery for edema mainly in his LLE. Now only trace pedal edema seen-will trial of off TED and monitor for change in edema. Continue Furosemide 20mg  qod.

## 2013-12-20 NOTE — Assessment & Plan Note (Signed)
Controlled on Lasix 20mg  qod

## 2013-12-20 NOTE — Assessment & Plan Note (Signed)
Rate controlled without rhythm agent. Chronic anticoagulation therapy with Coumadin for risk reduction of thromboembolic event

## 2013-12-20 NOTE — Assessment & Plan Note (Signed)
Takes Omeprazole 20mg and Ranitidine 150mg and Mylanta prn-stable.   

## 2013-12-20 NOTE — Progress Notes (Signed)
Patient ID: [name], male   DOB: October 08, 1919, 80 y.o.   MRN: [id number]   Code Status: DNR  Allergies  Allergen Reactions  . Sulfonamide Derivatives Rash    REACTION: rash    Chief Complaint  Patient presents with  . Medical Managment of Chronic Issues    BLE edema, progressive RUE weakness  . Acute Visit    HPI: Patient is a 80 y.o. male seen in the [facility] at [facility] today for evaluation of edema, TED, progressive RUE weakness, and other chronic medical conditions.  Problem List Items Addressed This Visit   Atrial fibrillation - Primary     Rate controlled without rhythm agent. Chronic anticoagulation therapy with Coumadin for risk reduction of thromboembolic event          CVA (cerebral vascular accident)     Right sided weakness with right wrist/hand contracture. Grip strength is still 5/5. Ambulates with walker and w/c to go further. PT and OT to eval and tx for progressive RUE weakness.           Edema     Trace pedal edema R>L. The patient stated he started wearing TED after the left lower leg melanoma removal surgery for edema mainly in his LLE. Now only trace pedal edema seen-will trial of off TED and monitor for change in edema. Continue Furosemide 20mg  qod.     GERD     Takes Omeprazole 20mg  and Ranitidine 150mg  and Mylanta prn-stable.               Relevant Medications      polyethylene glycol (MIRALAX / GLYCOLAX) packet   HYPERTENSION     Controlled on Lasix 20mg  qod          Unspecified constipation     Stable with MiraLax qod and pnr Dulcolax suppository q12hr             Review of Systems:  Review of Systems  Constitutional: Negative for fever, chills, weight loss, malaise/fatigue and diaphoresis.       Generalized weakness and the right sided weakness from previous CVA  HENT: Positive for hearing loss. Negative for congestion, ear discharge, ear pain, nosebleeds, sore throat and tinnitus.   Eyes:  Negative for blurred vision, double vision, photophobia, pain, discharge and redness.       Left eye blindness  Respiratory: Negative for cough, hemoptysis, sputum production, shortness of breath, wheezing and stridor.   Cardiovascular: Positive for leg swelling. Negative for chest pain, palpitations, orthopnea, claudication and PND.       Trace pedal edema R>L  Gastrointestinal: Negative for heartburn, nausea, vomiting, abdominal pain, diarrhea, constipation, blood in stool and melena.  Genitourinary: Negative for dysuria, urgency, frequency, hematuria and flank pain.  Musculoskeletal: Negative for back pain, falls, joint pain, myalgias and neck pain.  Skin: Negative for itching and rash.       Right cholecystostomy surgical scar. The medial left lower leg surgical scar from previous melanoma removal.   Neurological: Positive for focal weakness and weakness. Negative for dizziness, tingling, tremors, sensory change, speech change, seizures, loss of consciousness and headaches.       Right sided weakness from previous CVA even if grip strength is 5/5. The right wrist and hand contracture. Ambulates with walker and w/c to go further. C/o progressive weakness noted in his RUE  Endo/Heme/Allergies: Negative for environmental allergies and polydipsia. Bruises/bleeds easily.  Psychiatric/Behavioral: Negative for depression, suicidal ideas, hallucinations, memory loss and substance abuse.  The patient is not nervous/anxious and does not have insomnia.    (type .ros)  Past Medical History  Diagnosis Date  . GERD (gastroesophageal reflux disease)   . Hypertension   . Hearing loss     wears hearing aid - right side  . Leg swelling   . Trouble swallowing   . Bruises easily     due to coumadin  . Weakness     difficulty walking  . Contact lens/glasses fitting   . Stroke 1999  . Cancer   . 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 Gait disturbance   . Dyslipidemia   . COPD (chronic obstructive pulmonary  disease)   . Peripheral vascular disease   . History of melanoma   . Diverticulosis   . Abnormality of gait 05/16/2013  . Choledocholithiasis with obstruction 06/28/13  . Embolic stroke   . PAF (paroxysmal atrial fibrillation)   . Hyperlipidemia   . Bradycardia   . Protein-calorie malnutrition, severe   . Ventricular tachycardia (paroxysmal)   . Obstructive sleep apnea of adult     uses cpap, pt does not know setting  . Personal history of fall 05/29/2013  . Fracture of femoral neck, right, closed 3114  . Long term (current) use of anticoagulants     PAF and embolic CVA  . Anemia, unspecified 07/05/2013  . Xerophthalmia 07/05/2013  . 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 Actinic keratosis   . Seborrheic keratosis    Past Surgical History  Procedure Laterality Date  . Excision of melanoma  2009    left leg  . Melanoma excision      many melanoma removed in past  . Melanoma excision  10/11/2011    Procedure: MELANOMA EXCISION;  Surgeon: Pedro Earls, MD;  Location: [place];  Service: General;  Laterality: Left;  EXCISION melanoma left leg with full thickness skin grafting from left lower abdomen.  . Joint replacement  2012    r fem head fx  . Lung benign area removed   1970's  . Hernia repair  1983  . Melanoma excision  05/16/2012    Procedure: MELANOMA EXCISION;  Surgeon: Pedro Earls, MD;  Location: [facility];  Service: General;  Laterality: Left;  Nodule Removal of Melanoma on Left Shin  . Ercp N/A 07/04/2013    Procedure: ENDOSCOPIC RETROGRADE CHOLANGIOPANCREATOGRAPHY (ERCP);  Surgeon: Irene Shipper, MD;  Location: [facility];  Service: Endoscopy;  Laterality: N/A;   Social History:   reports that he quit smoking about 49 years ago. His smoking use included Pipe. He has never used smokeless tobacco. He reports that he does not drink alcohol or use illicit drugs.  Family History  Problem Relation Age of Onset  . Heart disease Father 93  . Cancer Son     prostate  . Dementia  Sister     One sister has dementia    Medications: Patient's Medications  New Prescriptions   No medications on file  Previous Medications   ACETAMINOPHEN (TYLENOL) 325 MG TABLET    Take 650 mg by mouth every 6 (six) hours as needed for pain.    ALUM & MAG HYDROXIDE-SIMETH (MAALOX/MYLANTA) 200-200-20 MG/5ML SUSPENSION    Take 30 mLs by mouth every 6 (six) hours as needed.   BISACODYL (DULCOLAX) 10 MG SUPPOSITORY    Place 1 suppository (10 mg total) rectally every 12 (twelve) hours as needed.   FEEDING SUPPLEMENT (ENSURE COMPLETE) LIQD    Take 237 mLs by mouth  2 (two) times daily between meals.   FUROSEMIDE (LASIX) 20 MG TABLET    20 mg every other day.    HYDROXYPROPYL METHYLCELLULOSE (ISOPTO TEARS) 2.5 % OPHTHALMIC SOLUTION    Place 1 drop into both eyes 4 (four) times daily as needed (eye irritation).    MECLIZINE (ANTIVERT) 25 MG TABLET    Take 25 mg by mouth 3 (three) times daily as needed for dizziness.    OMEPRAZOLE (PRILOSEC) 20 MG CAPSULE    Take 20 mg by mouth daily.   RANITIDINE (ZANTAC) 150 MG TABLET    Take 150 mg by mouth at bedtime.   WARFARIN (COUMADIN) 3 MG TABLET    Take 3 mg by mouth every other day. Alternate with 4mg  tablet.   WARFARIN (COUMADIN) 4 MG TABLET    Take 4 mg by mouth every other day. Alternate with 3mg  tablet.  Modified Medications   Modified Medication Previous Medication   POLYETHYLENE GLYCOL (MIRALAX / GLYCOLAX) PACKET polyethylene glycol (MIRALAX / GLYCOLAX) packet      Take 17 g by mouth every other day.    Take 17 g by mouth 2 (two) times daily.  Discontinued Medications   HYDROCODONE-ACETAMINOPHEN (NORCO/VICODIN) 5-325 MG PER TABLET    Take 1 tablet by mouth every 6 (six) hours as needed for pain.     Physical Exam: Physical Exam  Constitutional: He is oriented to person, place, and time. He appears well-developed and well-nourished. No distress.  HENT:  Head: Normocephalic and atraumatic.  Right Ear: External ear normal.  Left Ear: External  ear normal.  Nose: Nose normal.  Mouth/Throat: Oropharynx is clear and moist. No oropharyngeal exudate.  Eyes: Conjunctivae are normal. Pupils are equal, round, and reactive to light. Right eye exhibits no discharge. No scleral icterus.  Left eye blindness  Neck: Normal range of motion. Neck supple. No JVD present. No tracheal deviation present. No thyromegaly present.  Cardiovascular: Normal rate and normal heart sounds.  An irregular rhythm present.  Pulmonary/Chest: Effort normal and breath sounds normal. No stridor. No respiratory distress. He has no wheezes. He has no rales. He exhibits no tenderness.  Abdominal: He exhibits no distension. There is no tenderness. There is no rebound and no guarding.  Musculoskeletal: Normal range of motion. He exhibits edema. He exhibits no tenderness.  The right sided weakness since previous CVA-muscle strength is still 5/5. The right wrist and hand contractures-able to make fist and cannot extend fully. Ambulates with walker and w/c to go further. Trace pedal edema R>L  Lymphadenopathy:    He has no cervical adenopathy.  Neurological: He is alert and oriented to person, place, and time. He displays abnormal reflex. No cranial nerve deficit. Coordination normal.  Skin: Skin is warm and dry. No rash noted. He is not diaphoretic. No erythema. No pallor.  The right cholecystostomy surgical scar. The medial left lower leg surgical scar from previous melanoma removal.   Psychiatric: Thought content normal. His mood appears anxious. His affect is not angry and not inappropriate. His speech is not rapid and/or pressured, not delayed and not slurred. He is agitated. He is not aggressive, not hyperactive, not slowed and not withdrawn. Cognition and memory are impaired. He expresses impulsivity and inappropriate judgment. He does not exhibit a depressed mood. He exhibits abnormal recent memory. He exhibits normal remote memory.   (type .physexam) Filed Vitals:    12/20/13 1047  BP: 130/70  Pulse: 60  Temp: 97.8 F (36.6 C)  TempSrc: Tympanic  Resp:  16      Labs reviewed: Basic Metabolic Panel:  Recent Labs  06/29/13 1820  08/10/13 0805 08/11/13 0405 08/13/13 0425 09/13/13  NA  --   < > 133* 133* 137 137  K  --   < > 3.0* 3.6 3.3* 4.9  CL  --   < > 99 103 106  --   CO2  --   < > 24 23 24   --   GLUCOSE  --   < > 91 136* 98  --   BUN  --   < > 43* 32* 19 19  CREATININE  --   < > 1.31 1.23 1.09 1.0  CALCIUM  --   < > 7.8* 7.8* 8.2*  --   MG 1.8  --   --   --   --   --   TSH  --   --   --   --   --  2.34  < > = values in this interval not displayed. Liver Function Tests:  Recent Labs  07/05/13 0417 08/09/13 1749 08/10/13 0805 09/13/13  AST 43* 28 15 18   ALT 43 22 15 12   ALKPHOS 570* 259* 194* 162*  BILITOT 1.6* 1.0 0.8  --   PROT 5.5* 6.6 5.2*  --   ALBUMIN 2.2* 2.5* 1.8*  --     Recent Labs  06/28/13 2114 08/09/13 1805  LIPASE 18 13   CBC:  Recent Labs  05/29/13 1616 06/28/13 2114  08/09/13 1749 08/10/13 0805 08/11/13 0405 08/13/13 0425 08/16/13 09/13/13  WBC 10.6* 24.5*  < > 23.0* 19.3* 18.7* 11.2* 17.7 5.2  NEUTROABS 9.0* 22.8*  --  20.3*  --   --   --   --   --   HGB 13.3 11.2*  < > 12.7* 10.8* 10.1* 11.3* 11.4* 11.5*  HCT 40.3 34.3*  < > 38.6* 32.9* 31.0* 35.2* 35* 35*  MCV 89.2 89.1  < > 88.3 89.2 90.1 91.0  --   --   PLT 340 345  < > 280 264 300 311 392 336  < > = values in this interval not displayed.  Assessment/Plan Atrial fibrillation Rate controlled without rhythm agent. Chronic anticoagulation therapy with Coumadin for risk reduction of thromboembolic event        CVA (cerebral vascular accident) Right sided weakness with right wrist/hand contracture. Grip strength is still 5/5. Ambulates with walker and w/c to go further. PT and OT to eval and tx for progressive RUE weakness.         GERD Takes Omeprazole 20mg  and Ranitidine 150mg  and Mylanta prn-stable.              HYPERTENSION Controlled on Lasix 20mg  qod        Unspecified constipation Stable with MiraLax qod and pnr Dulcolax suppository q12hr        Edema Trace pedal edema R>L. The patient stated he started wearing TED after the left lower leg melanoma removal surgery for edema mainly in his LLE. Now only trace pedal edema seen-will trial of off TED and monitor for change in edema. Continue Furosemide 20mg  qod.     Family/ Staff Communication: observe the patient.   Goals of Care: AL  Labs/tests ordered: none

## 2014-02-01 ENCOUNTER — Non-Acute Institutional Stay: Payer: Medicare Other | Admitting: Nurse Practitioner

## 2014-02-01 DIAGNOSIS — K5900 Constipation, unspecified: Secondary | ICD-10-CM

## 2014-02-01 DIAGNOSIS — Z7901 Long term (current) use of anticoagulants: Secondary | ICD-10-CM

## 2014-02-01 DIAGNOSIS — I6350 Cerebral infarction due to unspecified occlusion or stenosis of unspecified cerebral artery: Secondary | ICD-10-CM

## 2014-02-01 DIAGNOSIS — I10 Essential (primary) hypertension: Secondary | ICD-10-CM

## 2014-02-01 DIAGNOSIS — I4891 Unspecified atrial fibrillation: Secondary | ICD-10-CM

## 2014-02-01 DIAGNOSIS — K219 Gastro-esophageal reflux disease without esophagitis: Secondary | ICD-10-CM

## 2014-02-01 DIAGNOSIS — D638 Anemia in other chronic diseases classified elsewhere: Secondary | ICD-10-CM

## 2014-02-01 DIAGNOSIS — E876 Hypokalemia: Secondary | ICD-10-CM

## 2014-02-01 DIAGNOSIS — D72829 Elevated white blood cell count, unspecified: Secondary | ICD-10-CM

## 2014-02-01 DIAGNOSIS — I639 Cerebral infarction, unspecified: Secondary | ICD-10-CM

## 2014-02-01 DIAGNOSIS — R609 Edema, unspecified: Secondary | ICD-10-CM

## 2014-02-01 DIAGNOSIS — E871 Hypo-osmolality and hyponatremia: Secondary | ICD-10-CM

## 2014-02-01 LAB — CBC AND DIFFERENTIAL
HEMATOCRIT: 33 % — AB (ref 41–53)
HEMOGLOBIN: 10.8 g/dL — AB (ref 13.5–17.5)
PLATELETS: 282 10*3/uL (ref 150–399)
WBC: 6.1 10*3/mL

## 2014-02-01 LAB — POCT INR: INR: 1.4 — AB (ref 0.9–1.1)

## 2014-02-01 LAB — BASIC METABOLIC PANEL
BUN: 21 mg/dL (ref 4–21)
Creatinine: 1.1 mg/dL (ref 0.6–1.3)
GLUCOSE: 94 mg/dL
Potassium: 4.5 mmol/L (ref 3.4–5.3)
Sodium: 139 mmol/L (ref 137–147)

## 2014-02-01 LAB — HEPATIC FUNCTION PANEL
ALT: 8 U/L — AB (ref 10–40)
AST: 11 U/L — AB (ref 14–40)
Alkaline Phosphatase: 96 U/L (ref 25–125)
BILIRUBIN, TOTAL: 0.5 mg/dL

## 2014-02-02 ENCOUNTER — Emergency Department (HOSPITAL_COMMUNITY): Payer: Medicare Other

## 2014-02-02 ENCOUNTER — Encounter: Payer: Self-pay | Admitting: Nurse Practitioner

## 2014-02-02 ENCOUNTER — Inpatient Hospital Stay (HOSPITAL_COMMUNITY)
Admission: EM | Admit: 2014-02-02 | Discharge: 2014-02-06 | DRG: 308 | Disposition: A | Discharge status: Skilled Nursing Facility | Payer: Medicare Other | Attending: Internal Medicine | Admitting: Internal Medicine

## 2014-02-02 ENCOUNTER — Encounter (HOSPITAL_COMMUNITY): Payer: Self-pay | Admitting: Emergency Medicine

## 2014-02-02 DIAGNOSIS — J4489 Other specified chronic obstructive pulmonary disease: Secondary | ICD-10-CM | POA: Diagnosis present

## 2014-02-02 DIAGNOSIS — Z66 Do not resuscitate: Secondary | ICD-10-CM | POA: Diagnosis present

## 2014-02-02 DIAGNOSIS — R4181 Age-related cognitive decline: Secondary | ICD-10-CM | POA: Diagnosis present

## 2014-02-02 DIAGNOSIS — H9190 Unspecified hearing loss, unspecified ear: Secondary | ICD-10-CM | POA: Diagnosis present

## 2014-02-02 DIAGNOSIS — R7881 Bacteremia: Secondary | ICD-10-CM | POA: Diagnosis present

## 2014-02-02 DIAGNOSIS — I6340 Cerebral infarction due to embolism of unspecified cerebral artery: Secondary | ICD-10-CM | POA: Diagnosis present

## 2014-02-02 DIAGNOSIS — I4891 Unspecified atrial fibrillation: Principal | ICD-10-CM | POA: Diagnosis present

## 2014-02-02 DIAGNOSIS — A419 Sepsis, unspecified organism: Secondary | ICD-10-CM | POA: Diagnosis present

## 2014-02-02 DIAGNOSIS — I10 Essential (primary) hypertension: Secondary | ICD-10-CM | POA: Diagnosis present

## 2014-02-02 DIAGNOSIS — D638 Anemia in other chronic diseases classified elsewhere: Secondary | ICD-10-CM | POA: Diagnosis present

## 2014-02-02 DIAGNOSIS — N179 Acute kidney failure, unspecified: Secondary | ICD-10-CM

## 2014-02-02 DIAGNOSIS — H5440 Blindness, one eye, unspecified eye: Secondary | ICD-10-CM | POA: Diagnosis present

## 2014-02-02 DIAGNOSIS — E860 Dehydration: Secondary | ICD-10-CM | POA: Diagnosis present

## 2014-02-02 DIAGNOSIS — G4733 Obstructive sleep apnea (adult) (pediatric): Secondary | ICD-10-CM | POA: Diagnosis present

## 2014-02-02 DIAGNOSIS — K5900 Constipation, unspecified: Secondary | ICD-10-CM

## 2014-02-02 DIAGNOSIS — R471 Dysarthria and anarthria: Secondary | ICD-10-CM | POA: Diagnosis present

## 2014-02-02 DIAGNOSIS — Z5181 Encounter for therapeutic drug level monitoring: Secondary | ICD-10-CM

## 2014-02-02 DIAGNOSIS — D72829 Elevated white blood cell count, unspecified: Secondary | ICD-10-CM

## 2014-02-02 DIAGNOSIS — M6281 Muscle weakness (generalized): Secondary | ICD-10-CM

## 2014-02-02 DIAGNOSIS — K219 Gastro-esophageal reflux disease without esophagitis: Secondary | ICD-10-CM

## 2014-02-02 DIAGNOSIS — R6889 Other general symptoms and signs: Secondary | ICD-10-CM

## 2014-02-02 DIAGNOSIS — Z8249 Family history of ischemic heart disease and other diseases of the circulatory system: Secondary | ICD-10-CM

## 2014-02-02 DIAGNOSIS — K5730 Diverticulosis of large intestine without perforation or abscess without bleeding: Secondary | ICD-10-CM | POA: Diagnosis present

## 2014-02-02 DIAGNOSIS — R1311 Dysphagia, oral phase: Secondary | ICD-10-CM | POA: Diagnosis present

## 2014-02-02 DIAGNOSIS — Z7901 Long term (current) use of anticoagulants: Secondary | ICD-10-CM

## 2014-02-02 DIAGNOSIS — Z87891 Personal history of nicotine dependence: Secondary | ICD-10-CM

## 2014-02-02 DIAGNOSIS — R509 Fever, unspecified: Secondary | ICD-10-CM

## 2014-02-02 DIAGNOSIS — B961 Klebsiella pneumoniae [K. pneumoniae] as the cause of diseases classified elsewhere: Secondary | ICD-10-CM | POA: Diagnosis present

## 2014-02-02 DIAGNOSIS — Z79899 Other long term (current) drug therapy: Secondary | ICD-10-CM

## 2014-02-02 DIAGNOSIS — I69959 Hemiplegia and hemiparesis following unspecified cerebrovascular disease affecting unspecified side: Secondary | ICD-10-CM

## 2014-02-02 DIAGNOSIS — E876 Hypokalemia: Secondary | ICD-10-CM

## 2014-02-02 DIAGNOSIS — J9600 Acute respiratory failure, unspecified whether with hypoxia or hypercapnia: Secondary | ICD-10-CM

## 2014-02-02 DIAGNOSIS — R609 Edema, unspecified: Secondary | ICD-10-CM

## 2014-02-02 DIAGNOSIS — Z85820 Personal history of malignant melanoma of skin: Secondary | ICD-10-CM

## 2014-02-02 DIAGNOSIS — R1313 Dysphagia, pharyngeal phase: Secondary | ICD-10-CM | POA: Diagnosis present

## 2014-02-02 DIAGNOSIS — K810 Acute cholecystitis: Secondary | ICD-10-CM

## 2014-02-02 DIAGNOSIS — I639 Cerebral infarction, unspecified: Secondary | ICD-10-CM | POA: Diagnosis present

## 2014-02-02 DIAGNOSIS — I739 Peripheral vascular disease, unspecified: Secondary | ICD-10-CM | POA: Diagnosis present

## 2014-02-02 DIAGNOSIS — E785 Hyperlipidemia, unspecified: Secondary | ICD-10-CM | POA: Diagnosis present

## 2014-02-02 DIAGNOSIS — J9601 Acute respiratory failure with hypoxia: Secondary | ICD-10-CM

## 2014-02-02 DIAGNOSIS — J449 Chronic obstructive pulmonary disease, unspecified: Secondary | ICD-10-CM | POA: Diagnosis present

## 2014-02-02 DIAGNOSIS — L570 Actinic keratosis: Secondary | ICD-10-CM | POA: Diagnosis present

## 2014-02-02 DIAGNOSIS — Z96649 Presence of unspecified artificial hip joint: Secondary | ICD-10-CM

## 2014-02-02 DIAGNOSIS — E43 Unspecified severe protein-calorie malnutrition: Secondary | ICD-10-CM

## 2014-02-02 DIAGNOSIS — L821 Other seborrheic keratosis: Secondary | ICD-10-CM | POA: Diagnosis present

## 2014-02-02 DIAGNOSIS — G459 Transient cerebral ischemic attack, unspecified: Secondary | ICD-10-CM

## 2014-02-02 DIAGNOSIS — R0902 Hypoxemia: Secondary | ICD-10-CM | POA: Diagnosis present

## 2014-02-02 DIAGNOSIS — Z882 Allergy status to sulfonamides status: Secondary | ICD-10-CM

## 2014-02-02 DIAGNOSIS — I959 Hypotension, unspecified: Secondary | ICD-10-CM | POA: Diagnosis present

## 2014-02-02 DIAGNOSIS — E871 Hypo-osmolality and hyponatremia: Secondary | ICD-10-CM

## 2014-02-02 DIAGNOSIS — A498 Other bacterial infections of unspecified site: Secondary | ICD-10-CM

## 2014-02-02 DIAGNOSIS — R531 Weakness: Secondary | ICD-10-CM

## 2014-02-02 DIAGNOSIS — Z434 Encounter for attention to other artificial openings of digestive tract: Secondary | ICD-10-CM

## 2014-02-02 HISTORY — DX: Cerebral infarction, unspecified: I63.9

## 2014-02-02 LAB — CBC WITH DIFFERENTIAL/PLATELET
BASOS PCT: 0 % (ref 0–1)
Basophils Absolute: 0 10*3/uL (ref 0.0–0.1)
Eosinophils Absolute: 0 10*3/uL (ref 0.0–0.7)
Eosinophils Relative: 0 % (ref 0–5)
HCT: 34.4 % — ABNORMAL LOW (ref 39.0–52.0)
HEMOGLOBIN: 11.1 g/dL — AB (ref 13.0–17.0)
LYMPHS ABS: 0.2 10*3/uL — AB (ref 0.7–4.0)
Lymphocytes Relative: 2 % — ABNORMAL LOW (ref 12–46)
MCH: 29.2 pg (ref 26.0–34.0)
MCHC: 32.3 g/dL (ref 30.0–36.0)
MCV: 90.5 fL (ref 78.0–100.0)
MONOS PCT: 2 % — AB (ref 3–12)
Monocytes Absolute: 0.3 10*3/uL (ref 0.1–1.0)
NEUTROS ABS: 11.3 10*3/uL — AB (ref 1.7–7.7)
NEUTROS PCT: 96 % — AB (ref 43–77)
Platelets: 226 10*3/uL (ref 150–400)
RBC: 3.8 MIL/uL — AB (ref 4.22–5.81)
RDW: 13.5 % (ref 11.5–15.5)
WBC: 11.7 10*3/uL — AB (ref 4.0–10.5)

## 2014-02-02 LAB — BASIC METABOLIC PANEL
BUN: 29 mg/dL — AB (ref 6–23)
CHLORIDE: 100 meq/L (ref 96–112)
CO2: 22 mEq/L (ref 19–32)
Calcium: 8.4 mg/dL (ref 8.4–10.5)
Creatinine, Ser: 1.22 mg/dL (ref 0.50–1.35)
GFR calc non Af Amer: 50 mL/min — ABNORMAL LOW (ref >90)
GFR, EST AFRICAN AMERICAN: 57 mL/min — AB (ref >90)
Glucose, Bld: 135 mg/dL — ABNORMAL HIGH (ref 70–99)
POTASSIUM: 4.3 meq/L (ref 3.7–5.3)
Sodium: 137 mEq/L (ref 137–147)

## 2014-02-02 LAB — URINALYSIS, ROUTINE W REFLEX MICROSCOPIC
Glucose, UA: NEGATIVE mg/dL
Hgb urine dipstick: NEGATIVE
Ketones, ur: NEGATIVE mg/dL
LEUKOCYTES UA: NEGATIVE
NITRITE: NEGATIVE
PH: 7 (ref 5.0–8.0)
Protein, ur: NEGATIVE mg/dL
SPECIFIC GRAVITY, URINE: 1.021 (ref 1.005–1.030)
Urobilinogen, UA: 4 mg/dL — ABNORMAL HIGH (ref 0.0–1.0)

## 2014-02-02 LAB — PROTIME-INR
INR: 2.03 — ABNORMAL HIGH (ref 0.00–1.49)
Prothrombin Time: 22.3 seconds — ABNORMAL HIGH (ref 11.6–15.2)

## 2014-02-02 LAB — TROPONIN I

## 2014-02-02 MED ORDER — SODIUM CHLORIDE 0.9 % IV BOLUS (SEPSIS)
1000.0000 mL | Freq: Once | INTRAVENOUS | Status: AC
  Administered 2014-02-02: 1000 mL via INTRAVENOUS

## 2014-02-02 MED ORDER — ACETAMINOPHEN 650 MG RE SUPP
650.0000 mg | Freq: Once | RECTAL | Status: AC
  Administered 2014-02-02: 650 mg via RECTAL
  Filled 2014-02-02: qty 1

## 2014-02-02 NOTE — ED Provider Notes (Signed)
CSN: [id number]     Arrival date & time 02/02/14  1748 History   First MD Initiated Contact with Patient 02/02/14 1810     Chief Complaint  Patient presents with  . Fever  . Emesis     (Consider location/radiation/quality/duration/timing/severity/associated sxs/prior Treatment) HPI.... level V caveat for dementia.   Patient lives at a [facility] [employer] in assisted living. Most of history obtained from EMS notes. Status post fever yesterday along with nausea and vomiting. O2 sats dropped earlier today. Not keeping fluids down. Severity is moderate.  Past Medical History  Diagnosis Date  . GERD (gastroesophageal reflux disease)   . Hypertension   . Hearing loss     wears hearing aid - right side  . Leg swelling   . Trouble swallowing   . Bruises easily     due to coumadin  . Weakness     difficulty walking  . Contact lens/glasses fitting   . Stroke 1999  . Cancer   . 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 Gait disturbance   . Dyslipidemia   . COPD (chronic obstructive pulmonary disease)   . Peripheral vascular disease   . History of melanoma   . Diverticulosis   . Abnormality of gait 05/16/2013  . Choledocholithiasis with obstruction 06/28/13  . Embolic stroke   . PAF (paroxysmal atrial fibrillation)   . Hyperlipidemia   . Bradycardia   . Protein-calorie malnutrition, severe   . Ventricular tachycardia (paroxysmal)   . Obstructive sleep apnea of adult     uses cpap, pt does not know setting  . Personal history of fall 05/29/2013  . Fracture of femoral neck, right, closed 3114  . Long term (current) use of anticoagulants     PAF and embolic CVA  . Anemia, unspecified 07/05/2013  . Xerophthalmia 07/05/2013  . 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 Actinic keratosis   . Seborrheic keratosis    Past Surgical History  Procedure Laterality Date  . Excision of melanoma  2009    left leg  . Melanoma excision      many melanoma removed in past  . Melanoma excision  10/11/2011   Procedure: MELANOMA EXCISION;  Surgeon: Pedro Earls, MD;  Location: [place];  Service: General;  Laterality: Left;  EXCISION melanoma left leg with full thickness skin grafting from left lower abdomen.  . Joint replacement  2012    r fem head fx  . Lung benign area removed   1970's  . Hernia repair  1983  . Melanoma excision  05/16/2012    Procedure: MELANOMA EXCISION;  Surgeon: Pedro Earls, MD;  Location: [facility];  Service: General;  Laterality: Left;  Nodule Removal of Melanoma on Left Shin  . Ercp N/A 07/04/2013    Procedure: ENDOSCOPIC RETROGRADE CHOLANGIOPANCREATOGRAPHY (ERCP);  Surgeon: Irene Shipper, MD;  Location: [facility];  Service: Endoscopy;  Laterality: N/A;   Family History  Problem Relation Age of Onset  . Heart disease Father 70  . Cancer Son     prostate  . Dementia Sister     One sister has dementia   History  Substance Use Topics  . Smoking status: Former Smoker    Types: Pipe    Quit date: 08/31/1964  . Smokeless tobacco: Never Used  . Alcohol Use: No    Review of Systems  Unable to perform ROS: Dementia      Allergies  Sulfonamide derivatives  Home Medications   Current Outpatient Rx  Name  Route  Sig  Dispense  Refill  . acetaminophen (TYLENOL) 325 MG tablet   Oral   Take 650 mg by mouth every 6 (six) hours as needed for pain.          Marland Kitchen alum & mag hydroxide-simeth (MINTOX) 200-200-20 MG/5ML suspension   Oral   Take 30 mLs by mouth every 6 (six) hours as needed for indigestion or heartburn.         . bisacodyl (DULCOLAX) 10 MG suppository   Rectal   Place 10 mg rectally every 12 (twelve) hours as needed for moderate constipation.         . enoxaparin (LOVENOX) 30 MG/0.3ML injection   Subcutaneous   Inject 30 mg into the skin every 12 (twelve) hours. Take until INR is between 2-3 then stop Lovenox.         . hydroxypropyl methylcellulose (ISOPTO TEARS) 2.5 % ophthalmic solution   Both Eyes   Place 1 drop into both eyes 4  (four) times daily as needed (eye irritation).          . meclizine (ANTIVERT) 25 MG tablet   Oral   Take 25 mg by mouth 3 (three) times daily as needed for dizziness.          . metoprolol tartrate (LOPRESSOR) 25 MG tablet   Oral   Take 25 mg by mouth 2 (two) times daily.         Marland Kitchen omeprazole (PRILOSEC) 20 MG capsule   Oral   Take 20 mg by mouth every morning.          . polyethylene glycol (MIRALAX / GLYCOLAX) packet   Oral   Take 17 g by mouth every other day.         . ranitidine (ZANTAC) 150 MG tablet   Oral   Take 150 mg by mouth at bedtime.         Marland Kitchen warfarin (COUMADIN) 5 MG tablet   Oral   Take 5 mg by mouth every evening.         Dema Severin Petrolatum-Mineral Oil (SYSTANE NIGHTTIME) OINT   Both Eyes   Place 1 application into both eyes at bedtime.         . furosemide (LASIX) 20 MG tablet      20 mg every other day.           BP 99/52  Pulse 64  Temp(Src) 98.4 F (36.9 C) (Oral)  Resp 16  SpO2 97% Physical Exam  Nursing note and vitals reviewed. Constitutional:  Responds slowly to questions, dehydrated  HENT:  Head: Normocephalic and atraumatic.  Eyes: Conjunctivae and EOM are normal. Pupils are equal, round, and reactive to light.  Neck: Normal range of motion. Neck supple.  Cardiovascular: Normal rate, regular rhythm and normal heart sounds.   Pulmonary/Chest: Effort normal and breath sounds normal.  Abdominal: Soft. Bowel sounds are normal.  Musculoskeletal:  Unable  Neurological:  Unable  Skin: Skin is warm and dry.  Psychiatric:  Unable    ED Course  Procedures (including critical care time) Labs Review Labs Reviewed  CBC WITH DIFFERENTIAL - Abnormal; Notable for the following:    WBC 11.7 (*)    RBC 3.80 (*)    Hemoglobin 11.1 (*)    HCT 34.4 (*)    Neutrophils Relative % 96 (*)    Neutro Abs 11.3 (*)    Lymphocytes Relative 2 (*)    Lymphs Abs 0.2 (*)  Monocytes Relative 2 (*)    All other components within  normal limits  BASIC METABOLIC PANEL - Abnormal; Notable for the following:    Glucose, Bld 135 (*)    BUN 29 (*)    GFR calc non Af Amer 50 (*)    GFR calc Af Amer 57 (*)    All other components within normal limits  URINALYSIS, ROUTINE W REFLEX MICROSCOPIC - Abnormal; Notable for the following:    Color, Urine AMBER (*)    Bilirubin Urine SMALL (*)    Urobilinogen, UA 4.0 (*)    All other components within normal limits  CULTURE, BLOOD (ROUTINE X 2)  CULTURE, BLOOD (ROUTINE X 2)   Imaging Review Dg Chest Port 1 View  02/02/2014   CLINICAL DATA:  Short of breath.  EXAM: PORTABLE CHEST - 1 VIEW  COMPARISON:  08/09/2013  FINDINGS: Cardiac silhouette is normal in size. Aorta is uncoiled. No mediastinal or hilar masses. No acute findings in the lungs. No pleural effusion or pneumothorax. The bony thorax is diffusely demineralized but grossly intact. No change from the prior study.  IMPRESSION: No acute cardiopulmonary disease.   Electronically Signed   By: Lajean Manes M.D.   On: 02/02/2014 18:49     EKG Interpretation None      MDM   Final diagnoses:  None    Patient is elderly, dehydrated, hypotensive.  He does respond to questions, but slowly.   Urinalysis and chest x-ray show no obvious infection. IV hydration. Rectal Tylenol. Admit to general medicine.    Nat Christen, MD 02/02/14 2112

## 2014-02-02 NOTE — Assessment & Plan Note (Signed)
Takes Omeprazole 20mg  and Ranitidine 150mg  and Mylanta prn-stable.

## 2014-02-02 NOTE — Assessment & Plan Note (Signed)
Resolved, wbc 6.1 02/01/14

## 2014-02-02 NOTE — Progress Notes (Signed)
Patient ID: [name], male   DOB: 05/11/1919, 81 y.o.   MRN: [id number]   Code Status: DNR  Allergies  Allergen Reactions  . Sulfonamide Derivatives Rash    REACTION: rash    Chief Complaint  Patient presents with  . Medical Managment of Chronic Issues    elevated Bp, dizziness, decreased corrodination of the left limbs.   . Acute Visit    HPI: Patient is a 80 y.o. male seen in the [facility] at [facility] today for evaluation of elevated Bp, poor appetite, sub-therapeutic INR, dizziness, decreased coordination of the RUE/RLE,  edema, TED, progressive RUE weakness, and other chronic medical conditions.  Problem List Items Addressed This Visit   Unspecified constipation - Primary     Stable with MiraLax qod and pnr Dulcolax suppository q12hr. Usually BM daily-last BM yesterday-the patient refused his Miralax-will have laxative today to help BM      Long term current use of anticoagulant      Hx of CVA and Afib. INR 1.4 sub-therapeutic. INR 3.3 01/27/14-held Coumadin 01/27/14. Will double Coumadin 4mg  x2 today, then increase to 5mg  daily, will start Lovenox 30mg  sq q12hr until INR is 2-3 to reduce risk of thromboembolic events. SNF for infirmary stay for observation.       Leukocytosis, unspecified     Resolved, wbc 6.1 02/01/14    Hyponatremia     Resolved, Na 139    Hypokalemia     Resolved, K 4.5 02/01/14    HYPERTENSION     Elevated. Will add Metoprolol 25mg  bid. Monitor Bp/P q shift.     Relevant Medications      metoprolol succinate (TOPROL-XL) 25 MG 24 hr tablet   GERD     Takes Omeprazole 20mg  and Ranitidine 150mg  and Mylanta prn-stable.       Edema     Trace pedal edema R>L-not apparent. Off TED. Hold Furosemide due to decreased oral intake. Observe the patient.      CVA (cerebral vascular accident)     Right sided weakness with right wrist/hand contracture. Grip strength is still 5/5. Ambulates with walker and w/c to go further. New onset of  decreased the left sided coordination-cannot transfer self independently from bed to w/c and dropped spoon when he eats. Muscle strength of the LUE and LLE 5/5. Will better manage anticoagulation-aim INR 2-3 with Lovenox to bridge, better control BP and hydration. ED evaluation if neurological deficits develop. Neuro check q2hr.      Atrial fibrillation     Rate controlled without rhythm agent. Chronic anticoagulation therapy with Coumadin for risk reduction of thromboembolic event       Anemia of chronic disease     Stable with Hgb 11.5 09/13/13 and 10.8 02/01/14        Review of Systems:  Review of Systems  Constitutional: Positive for malaise/fatigue. Negative for fever, chills, weight loss and diaphoresis.       Generalized weakness and the right sided weakness from previous CVA. Worse-stay in bed today and very little oral intake reportedly-usually he is OOB daily.   HENT: Positive for hearing loss. Negative for congestion, ear discharge, ear pain, nosebleeds, sore throat and tinnitus.   Eyes: Negative for blurred vision, double vision, photophobia, pain, discharge and redness.       Left eye blindness  Respiratory: Negative for cough, hemoptysis, sputum production, shortness of breath, wheezing and stridor.   Cardiovascular: Positive for leg swelling. Negative for chest pain, palpitations, orthopnea, claudication  and PND.       Trace pedal edema R>L  Gastrointestinal: Negative for heartburn, nausea, vomiting, abdominal pain, diarrhea, constipation, blood in stool and melena.  Genitourinary: Negative for dysuria, urgency, frequency, hematuria and flank pain.  Musculoskeletal: Negative for back pain, falls, joint pain, myalgias and neck pain.  Skin: Negative for itching and rash.       Right cholecystostomy surgical scar. The medial left lower leg surgical scar from previous melanoma removal.   Neurological: Positive for dizziness, focal weakness and weakness. Negative for tingling,  tremors, sensory change, speech change, seizures, loss of consciousness and headaches.       Right sided weakness from previous CVA even if grip strength is 5/5. The right wrist and hand contracture. Ambulates with walker and w/c to go further. C/o progressive weakness noted in his RUE. New onset of the left sided decreased coordination-cannot transfer self independently and dropped his utensil  Endo/Heme/Allergies: Negative for environmental allergies and polydipsia. Bruises/bleeds easily.  Psychiatric/Behavioral: Negative for depression, suicidal ideas, hallucinations, memory loss and substance abuse. The patient is not nervous/anxious and does not have insomnia.    Past Medical History  Diagnosis Date  . GERD (gastroesophageal reflux disease)   . Hypertension   . Hearing loss     wears hearing aid - right side  . Leg swelling   . Trouble swallowing   . Bruises easily     due to coumadin  . Weakness     difficulty walking  . Contact lens/glasses fitting   . Stroke 1999  . Cancer   . 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 Gait disturbance   . Dyslipidemia   . COPD (chronic obstructive pulmonary disease)   . Peripheral vascular disease   . History of melanoma   . Diverticulosis   . Abnormality of gait 05/16/2013  . Choledocholithiasis with obstruction 06/28/13  . Embolic stroke   . PAF (paroxysmal atrial fibrillation)   . Hyperlipidemia   . Bradycardia   . Protein-calorie malnutrition, severe   . Ventricular tachycardia (paroxysmal)   . Obstructive sleep apnea of adult     uses cpap, pt does not know setting  . Personal history of fall 05/29/2013  . Fracture of femoral neck, right, closed 3114  . Long term (current) use of anticoagulants     PAF and embolic CVA  . Anemia, unspecified 07/05/2013  . Xerophthalmia 07/05/2013  . 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 Actinic keratosis   . Seborrheic keratosis    Past Surgical History  Procedure Laterality Date  . Excision of melanoma  2009    left  leg  . Melanoma excision      many melanoma removed in past  . Melanoma excision  10/11/2011    Procedure: MELANOMA EXCISION;  Surgeon: Pedro Earls, MD;  Location: [facility];  Service: General;  Laterality: Left;  EXCISION melanoma left leg with full thickness skin grafting from left lower abdomen.  . Joint replacement  2012    r fem head fx  . Lung benign area removed   1970's  . Hernia repair  1983  . Melanoma excision  05/16/2012    Procedure: MELANOMA EXCISION;  Surgeon: Pedro Earls, MD;  Location: [facility];  Service: General;  Laterality: Left;  Nodule Removal of Melanoma on Left Shin  . Ercp N/A 07/04/2013    Procedure: ENDOSCOPIC RETROGRADE CHOLANGIOPANCREATOGRAPHY (ERCP);  Surgeon: Irene Shipper, MD;  Location: [facility];  Service: Endoscopy;  Laterality: N/A;  Social History:   reports that he quit smoking about 49 years ago. His smoking use included Pipe. He has never used smokeless tobacco. He reports that he does not drink alcohol or use illicit drugs.  Family History  Problem Relation Age of Onset  . Heart disease Father 43  . Cancer Son     prostate  . Dementia Sister     One sister has dementia    Medications: Patient's Medications  New Prescriptions   No medications on file  Previous Medications   ACETAMINOPHEN (TYLENOL) 325 MG TABLET    Take 650 mg by mouth every 6 (six) hours as needed for pain.    ALUM & MAG HYDROXIDE-SIMETH (MAALOX/MYLANTA) 200-200-20 MG/5ML SUSPENSION    Take 30 mLs by mouth every 6 (six) hours as needed.   BISACODYL (DULCOLAX) 10 MG SUPPOSITORY    Place 1 suppository (10 mg total) rectally every 12 (twelve) hours as needed.   FEEDING SUPPLEMENT (ENSURE COMPLETE) LIQD    Take 237 mLs by mouth 2 (two) times daily between meals.   FUROSEMIDE (LASIX) 20 MG TABLET    20 mg every other day.    HYDROXYPROPYL METHYLCELLULOSE (ISOPTO TEARS) 2.5 % OPHTHALMIC SOLUTION    Place 1 drop into both eyes 4 (four) times daily as needed (eye irritation).      MECLIZINE (ANTIVERT) 25 MG TABLET    Take 25 mg by mouth 3 (three) times daily as needed for dizziness.    METOPROLOL SUCCINATE (TOPROL-XL) 25 MG 24 HR TABLET    Take 25 mg by mouth daily.   OMEPRAZOLE (PRILOSEC) 20 MG CAPSULE    Take 20 mg by mouth daily.   POLYETHYLENE GLYCOL (MIRALAX / GLYCOLAX) PACKET    Take 17 g by mouth every other day.   RANITIDINE (ZANTAC) 150 MG TABLET    Take 150 mg by mouth at bedtime.   WARFARIN (COUMADIN) 3 MG TABLET    Take 3 mg by mouth every other day. Alternate with 4mg  tablet.   WARFARIN (COUMADIN) 4 MG TABLET    Take 4 mg by mouth every other day. Alternate with 3mg  tablet.  Modified Medications   No medications on file  Discontinued Medications   No medications on file     Physical Exam: Physical Exam  Constitutional: He is oriented to person, place, and time. He appears well-developed and well-nourished. No distress.  HENT:  Head: Normocephalic and atraumatic.  Right Ear: External ear normal.  Left Ear: External ear normal.  Nose: Nose normal.  Mouth/Throat: Oropharynx is clear and moist. No oropharyngeal exudate.  Eyes: Conjunctivae are normal. Pupils are equal, round, and reactive to light. Right eye exhibits no discharge. No scleral icterus.  Left eye blindness  Neck: Normal range of motion. Neck supple. No JVD present. No tracheal deviation present. No thyromegaly present.  Cardiovascular: Normal rate and normal heart sounds.  An irregular rhythm present.  Pulmonary/Chest: Effort normal and breath sounds normal. No stridor. No respiratory distress. He has no wheezes. He has no rales. He exhibits no tenderness.  Abdominal: He exhibits no distension. There is no tenderness. There is no rebound and no guarding.  Musculoskeletal: Normal range of motion. He exhibits edema. He exhibits no tenderness.  The right sided weakness since previous CVA-muscle strength is still 5/5. The right wrist and hand contractures-able to make fist and cannot extend  fully. Ambulates with walker and w/c to go further. Trace pedal edema R>L  Lymphadenopathy:    He has no  cervical adenopathy.  Neurological: He is alert and oriented to person, place, and time. He displays abnormal reflex. No cranial nerve deficit. Coordination normal.  New onset of the left sided decreased coordination-cannot transfer self independently and dropped his utensil, grip strength is still 5/5   Skin: Skin is warm and dry. No rash noted. He is not diaphoretic. No erythema. No pallor.  The right cholecystostomy surgical scar. The medial left lower leg surgical scar from previous melanoma removal.   Psychiatric: Thought content normal. His mood appears anxious. His affect is not angry and not inappropriate. His speech is not rapid and/or pressured, not delayed and not slurred. He is agitated. He is not aggressive, not hyperactive, not slowed and not withdrawn. Cognition and memory are impaired. He expresses impulsivity and inappropriate judgment. He does not exhibit a depressed mood. He exhibits abnormal recent memory. He exhibits normal remote memory.   (type .physexam) Filed Vitals:   02/01/14 1701  BP: 170/90  Pulse: 66  Temp: 98 F (36.7 C)  Resp: 18  SpO2: 95%      Labs reviewed: Basic Metabolic Panel:  Recent Labs  06/29/13 1820  08/10/13 0805 08/11/13 0405 08/13/13 0425 09/13/13 02/01/14  NA  --   < > 133* 133* 137 137 139  K  --   < > 3.0* 3.6 3.3* 4.9 4.5  CL  --   < > 99 103 106  --   --   CO2  --   < > 24 23 24   --   --   GLUCOSE  --   < > 91 136* 98  --   --   BUN  --   < > 43* 32* 19 19 21   CREATININE  --   < > 1.31 1.23 1.09 1.0 1.1  CALCIUM  --   < > 7.8* 7.8* 8.2*  --   --   MG 1.8  --   --   --   --   --   --   TSH  --   --   --   --   --  2.34  --   < > = values in this interval not displayed. Liver Function Tests:  Recent Labs  07/05/13 0417 08/09/13 1749 08/10/13 0805 09/13/13 02/01/14  AST 43* 28 15 18  11*  ALT 43 22 15 12  8*  ALKPHOS  570* 259* 194* 162* 96  BILITOT 1.6* 1.0 0.8  --   --   PROT 5.5* 6.6 5.2*  --   --   ALBUMIN 2.2* 2.5* 1.8*  --   --     Recent Labs  06/28/13 2114 08/09/13 1805  LIPASE 18 13   CBC:  Recent Labs  05/29/13 1616 06/28/13 2114  08/09/13 1749 08/10/13 0805 08/11/13 0405 08/13/13 0425 08/16/13 09/13/13 02/01/14  WBC 10.6* 24.5*  < > 23.0* 19.3* 18.7* 11.2* 17.7 5.2 6.1  NEUTROABS 9.0* 22.8*  --  20.3*  --   --   --   --   --   --   HGB 13.3 11.2*  < > 12.7* 10.8* 10.1* 11.3* 11.4* 11.5* 10.8*  HCT 40.3 34.3*  < > 38.6* 32.9* 31.0* 35.2* 35* 35* 33*  MCV 89.2 89.1  < > 88.3 89.2 90.1 91.0  --   --   --   PLT 340 345  < > 280 264 300 311 392 336 282  < > = values in this interval not displayed.  Past Procedures:  02/01/14 CXR mild cardiomegaly without pulmonary vascular congestion or pleural effusion, old granulomatous disease, no inflammatory consolidate or suspicious nodule, small patchy ares of atelectasis or non-consolidated pneumonia are seen at the lung bases.   Assessment/Plan Unspecified constipation Stable with MiraLax qod and pnr Dulcolax suppository q12hr. Usually BM daily-last BM yesterday-the patient refused his Miralax-will have laxative today to help BM    Long term current use of anticoagulant  Hx of CVA and Afib. INR 1.4 sub-therapeutic. INR 3.3 01/27/14-held Coumadin 01/27/14. Will double Coumadin 4mg  x2 today, then increase to 5mg  daily, will start Lovenox 30mg  sq q12hr until INR is 2-3 to reduce risk of thromboembolic events. SNF for infirmary stay for observation.     Leukocytosis, unspecified Resolved, wbc 6.1 02/01/14  Hyponatremia Resolved, Na 139  Hypokalemia Resolved, K 4.5 02/01/14  HYPERTENSION Elevated. Will add Metoprolol 25mg  bid. Monitor Bp/P q shift.   GERD Takes Omeprazole 20mg  and Ranitidine 150mg  and Mylanta prn-stable.     Edema Trace pedal edema R>L-not apparent. Off TED. Hold Furosemide due to decreased oral intake. Observe  the patient.    CVA (cerebral vascular accident) Right sided weakness with right wrist/hand contracture. Grip strength is still 5/5. Ambulates with walker and w/c to go further. New onset of decreased the left sided coordination-cannot transfer self independently from bed to w/c and dropped spoon when he eats. Muscle strength of the LUE and LLE 5/5. Will better manage anticoagulation-aim INR 2-3 with Lovenox to bridge, better control BP and hydration. ED evaluation if neurological deficits develop. Neuro check q2hr.    Atrial fibrillation Rate controlled without rhythm agent. Chronic anticoagulation therapy with Coumadin for risk reduction of thromboembolic event     Anemia of chronic disease Stable with Hgb 11.5 09/13/13 and 10.8 02/01/14     Family/ Staff Communication: observe the patient. SNF for infirmary observation.   Goals of Care: AL  Labs/tests ordered: CBC, CMP, CXR done 02/01/14

## 2014-02-02 NOTE — ED Notes (Signed)
Bed: WA04 Expected date: 02/02/14 Expected time: 5:47 PM Means of arrival: Ambulance Comments: ? aspiration

## 2014-02-02 NOTE — Assessment & Plan Note (Signed)
Resolved, K 4.5 02/01/14

## 2014-02-02 NOTE — Assessment & Plan Note (Signed)
Elevated. Will add Metoprolol 25mg  bid. Monitor Bp/P q shift.

## 2014-02-02 NOTE — Assessment & Plan Note (Signed)
Resolved, Na 139

## 2014-02-02 NOTE — Assessment & Plan Note (Signed)
Stable with MiraLax qod and pnr Dulcolax suppository q12hr. Usually BM daily-last BM yesterday-the patient refused his Miralax-will have laxative today to help BM

## 2014-02-02 NOTE — ED Notes (Signed)
Pt attempted to urinate in the urinal, but was unable to.

## 2014-02-02 NOTE — Assessment & Plan Note (Signed)
Right sided weakness with right wrist/hand contracture. Grip strength is still 5/5. Ambulates with walker and w/c to go further. New onset of decreased the left sided coordination-cannot transfer self independently from bed to w/c and dropped spoon when he eats. Muscle strength of the LUE and LLE 5/5. Will better manage anticoagulation-aim INR 2-3 with Lovenox to bridge, better control BP and hydration. ED evaluation if neurological deficits develop. Neuro check q2hr.

## 2014-02-02 NOTE — Assessment & Plan Note (Signed)
Hx of CVA and Afib. INR 1.4 sub-therapeutic. INR 3.3 01/27/14-held Coumadin 01/27/14. Will double Coumadin 4mg  x2 today, then increase to 5mg  daily, will start Lovenox 30mg  sq q12hr until INR is 2-3 to reduce risk of thromboembolic events. SNF for infirmary stay for observation.

## 2014-02-02 NOTE — ED Notes (Signed)
Per EMS: Pt from [place], assisted living. Onset fever yesterday afternoon along with N/V. One loose stool today. Today O2 dropped down into 70s, pt on 4L [place] now and O2 around 94-95%. Current fever 102. Pt unable to keep down Tylenol, even after 4mg  Zofran.

## 2014-02-02 NOTE — Assessment & Plan Note (Signed)
Rate controlled without rhythm agent. Chronic anticoagulation therapy with Coumadin for risk reduction of thromboembolic event       

## 2014-02-02 NOTE — Assessment & Plan Note (Signed)
Trace pedal edema R>L-not apparent. Off TED. Hold Furosemide due to decreased oral intake. Observe the patient.

## 2014-02-02 NOTE — Assessment & Plan Note (Signed)
Stable with Hgb 11.5 09/13/13 and 10.8 02/01/14

## 2014-02-02 NOTE — H&P (Signed)
PCP: [facility]ology : Christinia Gully, MD    Chief Complaint:  Nausea vomiting hypoxia  HPI: [name] is a 80 y.o. 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); Gait disturbance; Dyslipidemia; COPD (chronic obstructive pulmonary disease); Peripheral vascular disease; History of melanoma; Diverticulosis; Abnormality of gait (05/16/2013); Choledocholithiasis with obstruction (06/28/13); Embolic stroke; PAF (paroxysmal atrial fibrillation); Hyperlipidemia; Bradycardia; Protein-calorie malnutrition, severe; Ventricular tachycardia (paroxysmal); Obstructive sleep apnea of adult; Personal history of fall (05/29/2013); Fracture of femoral neck, right, closed (3114); Long term (current) use of anticoagulants; Anemia, unspecified (07/05/2013); Xerophthalmia (07/05/2013); Deafness in right ear; Actinic keratosis; and Seborrheic keratosis.   Presented with  [facility]. Her bruits patient recently had had declined by mouth intake, left sided weakness, trouble with coordination unable to transfer self independently from bed and have dropped his spoon on the left side. Duration of his symptoms is unclear. But he had been seen for this at 9 AM this AM.  Patient has history of remote CVA with residual right-sided weakness and contracture. He has history of paroxysmal age of fibrillation on Coumadin and metoprol.  At his baseline patient is able to ambulate with a walker. Recently his INR was subtherapeutic at 1.4 and he was given double the dose of Coumadin. Given above-mentioned symptoms today patient was started on Lovenox until his INR is to become therapeutic. He was also on frequent neurochecks every 2 hours. And his metoprolol was increased to 25 mg twice a day given  elevated blood pressure. In his Lasix has been held due to decreased by mouth intake. As can be seen by  records patient developed nausea and vomiting and possibly transient hypoxia at which point he was sent to [facility] emergency department. In ED R. his blood pressure trended down to 99/56. Patient was not noted to be febrile he was written for 1 L of normal saline. Currently on 2 L oxygen saturation oxygen 97%. Patient is still confused and lethargic unable to tell me why he is here. No active vomiting while in emergency department. It is unclear if he has a hasn't been febrile tonight. Hospitalist called for admission.   Review of Systems:     Pertinent positives include: nausea, vomiting, localizing neurological complaints  Constitutional:  No weight loss, night sweats, Fevers, chills, fatigue, weight loss  HEENT:  No headaches, Difficulty swallowing,Tooth/dental problems,Sore throat,  No sneezing, itching, ear ache, nasal congestion, post nasal drip,  Cardio-vascular:  No chest pain, Orthopnea, PND, anasarca, dizziness, palpitations.no Bilateral lower extremity swelling  GI:  No heartburn, indigestion, abdominal pain, diarrhea, change in bowel habits, loss of appetite, melena, blood in stool, hematemesis Resp:  no shortness of breath at rest. No dyspnea on exertion, No excess mucus, no productive cough, No non-productive cough, No coughing up of blood.No change in color of mucus.No wheezing. Skin:  no rash or lesions. No jaundice GU:  no dysuria, change in color of urine, no urgency or frequency. No straining to urinate.  No flank pain.  Musculoskeletal:  No joint pain or no joint swelling. No decreased range of motion. No back pain.  Psych:  No change in mood or affect. No depression or anxiety. No memory loss.  Neuro: no , no tingling, no weakness, no double vision, no gait abnormality, no slurred speech, no confusion  Otherwise  ROS are negative except for above, 10 systems were  reviewed  Past Medical History: Past Medical History  Diagnosis Date  . GERD (gastroesophageal reflux disease)   . Hypertension   . Hearing loss     wears hearing aid - right side  . Leg swelling   . Trouble swallowing   . Bruises easily     due to coumadin  . Weakness     difficulty walking  . Contact lens/glasses fitting   . Stroke 1999  . Cancer   . 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 Gait disturbance   . Dyslipidemia   . COPD (chronic obstructive pulmonary disease)   . Peripheral vascular disease   . History of melanoma   . Diverticulosis   . Abnormality of gait 05/16/2013  . Choledocholithiasis with obstruction 06/28/13  . Embolic stroke   . PAF (paroxysmal atrial fibrillation)   . Hyperlipidemia   . Bradycardia   . Protein-calorie malnutrition, severe   . Ventricular tachycardia (paroxysmal)   . Obstructive sleep apnea of adult     uses cpap, pt does not know setting  . Personal history of fall 05/29/2013  . Fracture of femoral neck, right, closed 3114  . Long term (current) use of anticoagulants     PAF and embolic CVA  . Anemia, unspecified 07/05/2013  . Xerophthalmia 07/05/2013  . 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 Actinic keratosis   . Seborrheic keratosis    Past Surgical History  Procedure Laterality Date  . Excision of melanoma  2009    left leg  . Melanoma excision      many melanoma removed in past  . Melanoma excision  10/11/2011    Procedure: MELANOMA EXCISION;  Surgeon: Pedro Earls, MD;  Location: [facility];  Service: General;  Laterality: Left;  EXCISION melanoma left leg with full thickness skin grafting from left lower abdomen.  . Joint replacement  2012    r fem head fx  . Lung benign area removed   1970's  . Hernia repair  1983  . Melanoma excision  05/16/2012    Procedure: MELANOMA EXCISION;  Surgeon: Pedro Earls, MD;  Location: [facility];  Service: General;  Laterality: Left;  Nodule Removal of Melanoma on Left Shin  . Ercp N/A 07/04/2013    Procedure:  ENDOSCOPIC RETROGRADE CHOLANGIOPANCREATOGRAPHY (ERCP);  Surgeon: Irene Shipper, MD;  Location: [facility];  Service: Endoscopy;  Laterality: N/A;     Medications: Prior to Admission medications   Medication Sig Start Date End Date Taking? Authorizing Provider  acetaminophen (TYLENOL) 325 MG tablet Take 650 mg by mouth every 6 (six) hours as needed for pain.    Yes Historical Provider, MD  alum & mag hydroxide-simeth (MINTOX) 200-200-20 MG/5ML suspension Take 30 mLs by mouth every 6 (six) hours as needed for indigestion or heartburn.   Yes Historical Provider, MD  bisacodyl (DULCOLAX) 10 MG suppository Place 10 mg rectally every 12 (twelve) hours as needed for moderate constipation.   Yes Historical Provider, MD  enoxaparin (LOVENOX) 30 MG/0.3ML injection Inject 30 mg into the skin every 12 (twelve) hours. Take until INR is between 2-3 then stop Lovenox.   Yes Historical Provider, MD  hydroxypropyl methylcellulose (ISOPTO TEARS) 2.5 % ophthalmic solution Place 1 drop into both eyes 4 (four) times daily as needed (eye irritation).    Yes Historical Provider, MD  meclizine (ANTIVERT) 25 MG tablet Take 25 mg by mouth 3 (three) times daily as needed for  dizziness.    Yes Historical Provider, MD  metoprolol tartrate (LOPRESSOR) 25 MG tablet Take 25 mg by mouth 2 (two) times daily.   Yes Historical Provider, MD  omeprazole (PRILOSEC) 20 MG capsule Take 20 mg by mouth every morning.    Yes Historical Provider, MD  polyethylene glycol (MIRALAX / GLYCOLAX) packet Take 17 g by mouth every other day. 08/13/13  Yes Robbie Lis, MD  ranitidine (ZANTAC) 150 MG tablet Take 150 mg by mouth at bedtime.   Yes Historical Provider, MD  warfarin (COUMADIN) 5 MG tablet Take 5 mg by mouth every evening.   Yes Historical Provider, MD  White Petrolatum-Mineral Oil (SYSTANE NIGHTTIME) OINT Place 1 application into both eyes at bedtime.   Yes Historical Provider, MD  furosemide (LASIX) 20 MG tablet 20 mg every other day.   03/18/13   Tanda Rockers, MD    Allergies:   Allergies  Allergen Reactions  . Sulfonamide Derivatives Rash    REACTION: rash    Social History:  Ambulatory  Walker  Lives at [facility] Daughter Dorena Dew Phone number [phone]   reports that he quit smoking about 49 years ago. His smoking use included Pipe. He has never used smokeless tobacco. He reports that he does not drink alcohol or use illicit drugs.   Family History: family history includes Cancer in his son; Dementia in his sister; Heart disease (age of onset: 64) in his father.    Physical Exam: Patient Vitals for the past 24 hrs:  BP Temp Temp src Pulse Resp SpO2  02/02/14 2031 99/52 mmHg 98.4 F (36.9 C) Oral 64 16 97 %  02/02/14 2017 - - - 64 16 97 %  02/02/14 1812 - - - - - 95 %  02/02/14 1811 - - - - - 88 %  02/02/14 1800 105/50 mmHg 99.8 F (37.7 C) Oral 68 16 94 %  02/02/14 1758 - - - - - 95 %    1. General:  in No Acute distress 2. Psychological: Alert but not Oriented 3. Head/ENT:   Dry Mucous Membranes                          Head Non traumatic, neck supple                          Normal  Dentition 4. SKIN: decreased Skin turgor,  Skin clean Dry and intact no rash 5. Heart: Regular rate and rhythm no Murmur, Rub or gallop 6. Lungs: Clear to auscultation bilaterally, no wheezes or crackles   7. Abdomen: Soft, non-tender, Non distended 8. Lower extremities: no clubbing, cyanosis, or edema 9. Neurologically right upper extremity drawn and contracted, moving spontaneous left upper and lower extremity. Grip 4/5 on the left but uknownbaseline.   10. MSK: Normal range of motion  body mass index is unknown because there is no weight on file.   Labs on Admission:   Recent Labs  02/01/14 02/02/14 1910  NA 139 137  K 4.5 4.3  CL  --  100  CO2  --  22  GLUCOSE  --  135*  BUN 21 29*  CREATININE 1.1 1.22  CALCIUM  --  8.4    Recent Labs  02/01/14  AST 11*  ALT 8*  ALKPHOS 96    No results found for this basename: LIPASE, AMYLASE,  in the last 72 hours  Recent  Labs  02/01/14 02/02/14 1910  WBC 6.1 11.7*  NEUTROABS  --  11.3*  HGB 10.8* 11.1*  HCT 33* 34.4*  MCV  --  90.5  PLT 282 226   No results found for this basename: CKTOTAL, CKMB, CKMBINDEX, TROPONINI,  in the last 72 hours No results found for this basename: TSH, T4TOTAL, FREET3, T3FREE, THYROIDAB,  in the last 72 hours No results found for this basename: VITAMINB12, FOLATE, FERRITIN, TIBC, IRON, RETICCTPCT,  in the last 72 hours No results found for this basename: HGBA1C    The CrCl is unknown because both a height and weight (above a minimum accepted value) are required for this calculation. ABG    Component Value Date/Time   TCO2 27 05/29/2013 1532     No results found for this basename: DDIMER     UA no evidence of UTI   Cultures:    Component Value Date/Time   SDES BLOOD RIGHT ARM 08/12/2013 1017   SPECREQUEST BOTTLES DRAWN AEROBIC AND ANAEROBIC 9 CC EA 08/12/2013 1017   CULT  Value: NO GROWTH 5 DAYS Performed at [facility] 08/12/2013 1017   REPTSTATUS 08/18/2013 FINAL 08/12/2013 1017       Radiological Exams on Admission: Dg Chest Port 1 View  02/02/2014   CLINICAL DATA:  Short of breath.  EXAM: PORTABLE CHEST - 1 VIEW  COMPARISON:  08/09/2013  FINDINGS: Cardiac silhouette is normal in size. Aorta is uncoiled. No mediastinal or hilar masses. No acute findings in the lungs. No pleural effusion or pneumothorax. The bony thorax is diffusely demineralized but grossly intact. No change from the prior study.  IMPRESSION: No acute cardiopulmonary disease.   Electronically Signed   By: Lajean Manes M.D.   On: 02/02/2014 18:49    Chart has been reviewed  Assessment/Plan 80 year old male with history of CVA infections major fibrillation was brought in with left-sided weakness nausea and vomiting and low blood pressures. Blood pressure improving with IV fluids CT of the head  unremarkable. In transfer to [facility] for CVA workup Present on Admission:  . Left-sided weakness - TIA/CVA workup. CT scan so far unremarkable. Neurology consult. Daily aspirin. Continue home Coumadin but hold on Lovenox. Echo gram, Dopplers,  . Atrial fibrillation - currently rate controlled for now hold the Toprol given hypertension would resume once the blood pressure improves  . HYPERTENSION -currently hypotensive getting gentle IV fluids  . Hypoxia - transient chest x-ray is unremarkable currently improved  . Anemia of chronic disease - continue to monitor hemoglobin  . Hypotension - possibly secondary to dehydration and recent medication adjustment. For now will hold blood pressure meds and give IV fluids. Blood cultures pending patient is afebrile. At this no evidence of sepsis  . Dehydration - give a fluids     Prophylaxis: on coumadin, Protonix  CODE STATUS: DNR/DNI  Other plan as per orders.  I have spent a total of 65 min on this admission time taken to discuss care of with neurology  [place] 02/02/2014, 9:56 PM

## 2014-02-03 DIAGNOSIS — M6281 Muscle weakness (generalized): Secondary | ICD-10-CM

## 2014-02-03 DIAGNOSIS — N179 Acute kidney failure, unspecified: Secondary | ICD-10-CM

## 2014-02-03 DIAGNOSIS — G459 Transient cerebral ischemic attack, unspecified: Secondary | ICD-10-CM | POA: Insufficient documentation

## 2014-02-03 DIAGNOSIS — D638 Anemia in other chronic diseases classified elsewhere: Secondary | ICD-10-CM

## 2014-02-03 DIAGNOSIS — J9600 Acute respiratory failure, unspecified whether with hypoxia or hypercapnia: Secondary | ICD-10-CM

## 2014-02-03 DIAGNOSIS — R509 Fever, unspecified: Secondary | ICD-10-CM

## 2014-02-03 DIAGNOSIS — K810 Acute cholecystitis: Secondary | ICD-10-CM

## 2014-02-03 DIAGNOSIS — I4891 Unspecified atrial fibrillation: Principal | ICD-10-CM

## 2014-02-03 LAB — LIPID PANEL
Cholesterol: 129 mg/dL (ref 0–200)
HDL: 45 mg/dL (ref >39)
LDL Cholesterol: 64 mg/dL (ref 0–99)
TRIGLYCERIDES: 99 mg/dL (ref <150)
Total CHOL/HDL Ratio: 2.9 RATIO
VLDL: 20 mg/dL (ref 0–40)

## 2014-02-03 LAB — PROTIME-INR
INR: 2.29 — ABNORMAL HIGH (ref 0.00–1.49)
Prothrombin Time: 24.5 seconds — ABNORMAL HIGH (ref 11.6–15.2)

## 2014-02-03 LAB — GLUCOSE, CAPILLARY
GLUCOSE-CAPILLARY: 87 mg/dL (ref 70–99)
GLUCOSE-CAPILLARY: 122 mg/dL — AB (ref 70–99)
Glucose-Capillary: 92 mg/dL (ref 70–99)
Glucose-Capillary: 88 mg/dL (ref 70–99)

## 2014-02-03 LAB — HEMOGLOBIN A1C
HEMOGLOBIN A1C: 5.6 % (ref <5.7)
Mean Plasma Glucose: 114 mg/dL (ref <117)

## 2014-02-03 LAB — MRSA PCR SCREENING: MRSA by PCR: NEGATIVE

## 2014-02-03 MED ORDER — FAMOTIDINE 20 MG PO TABS
20.0000 mg | ORAL_TABLET | Freq: Every day | ORAL | Status: DC
  Administered 2014-02-04 – 2014-02-06 (×3): 20 mg via ORAL
  Filled 2014-02-03 (×4): qty 1

## 2014-02-03 MED ORDER — WARFARIN SODIUM 5 MG PO TABS
5.0000 mg | ORAL_TABLET | Freq: Every day | ORAL | Status: DC
  Filled 2014-02-03: qty 1

## 2014-02-03 MED ORDER — PANTOPRAZOLE SODIUM 40 MG PO TBEC
40.0000 mg | DELAYED_RELEASE_TABLET | Freq: Every day | ORAL | Status: DC
  Administered 2014-02-04 – 2014-02-06 (×3): 40 mg via ORAL
  Filled 2014-02-03 (×3): qty 1

## 2014-02-03 MED ORDER — WARFARIN - PHARMACIST DOSING INPATIENT: Freq: Every day | Status: DC

## 2014-02-03 MED ORDER — POLYETHYLENE GLYCOL 3350 17 G PO PACK
17.0000 g | PACK | ORAL | Status: DC
  Filled 2014-02-03 (×2): qty 1

## 2014-02-03 MED ORDER — BISACODYL 10 MG RE SUPP: 10.0000 mg | Freq: Two times a day (BID) | RECTAL | Status: DC | PRN

## 2014-02-03 MED ORDER — POLYVINYL ALCOHOL 1.4 % OP SOLN: 1.0000 [drp] | Freq: Four times a day (QID) | OPHTHALMIC | Status: DC | PRN

## 2014-02-03 MED ORDER — PIPERACILLIN-TAZOBACTAM 3.375 G IVPB
3.3750 g | Freq: Three times a day (TID) | INTRAVENOUS | Status: DC
  Administered 2014-02-03 – 2014-02-05 (×7): 3.375 g via INTRAVENOUS
  Filled 2014-02-03 (×10): qty 50

## 2014-02-03 MED ORDER — SODIUM CHLORIDE 0.9 % IV SOLN
INTRAVENOUS | Status: AC
  Administered 2014-02-03: 05:00:00 via INTRAVENOUS

## 2014-02-03 MED ORDER — ACETAMINOPHEN 325 MG PO TABS: 650.0000 mg | ORAL_TABLET | ORAL | Status: DC | PRN

## 2014-02-03 MED ORDER — ASPIRIN 81 MG PO CHEW
81.0000 mg | CHEWABLE_TABLET | Freq: Every day | ORAL | Status: DC
  Administered 2014-02-04 – 2014-02-05 (×2): 81 mg via ORAL
  Filled 2014-02-03 (×4): qty 1

## 2014-02-03 NOTE — Significant Event (Signed)
Called by RN 2/2 blood cultures positive for GNR. Started on Zosyn, obtain urine culture and LFTs.  Birdie Hopes Pager: [phone] 02/03/2014, 5:21 PM

## 2014-02-03 NOTE — Progress Notes (Signed)
[facility] PROGRESS NOTE   [name] YNW:[id number] DOB: 03/07/1919 DOA: 02/02/2014 PCP: Christinia Gully, MD  HPI/Subjective: Very hard of hearing  Assessment/Plan: Active Problems:   HYPERTENSION   Long term current use of anticoagulant   Atrial fibrillation   Anemia of chronic disease   Hypoxia   Hypotension   Dehydration   Left-sided weakness   TIA (transient ischemic attack)   Left-sided weakness -TIA/CVA workup, CT scan of the head is unremarkable. -Neurology consulted. -Patient is on aspirin and Coumadin, was recently on Lovenox for a subtherapeutic INR on 2/27. -Dopplers, echo  Atrial fibrillation -Currently rate controlled, with Toprol-XL. -On Coumadin for secondary stroke prevention, INR is 2.29  Hypotension -Is possibly secondary to dehydration and recent medication adjustment. -Patient is afebrile, anyway blood cultures obtained to rule out infection. -This is corrected with IV fluids. -That was transient hypoxia of 88%,? Bad wave forms in the pulse ox secondary to hypertension.  Dehydration -Elevated BUN/creatinine, hydrated with IV fluids. -Check BMP in a.m.  Code Status: DO NOT RESUSCITATE Family Communication: Plan discussed with the patient. Disposition Plan: Remains inpatient   Consultants:  None  Procedures:  None  Antibiotics:  None  Objective: Filed Vitals:   02/03/14 0916  BP: 115/51  Pulse: 59  Temp: 97.5 F (36.4 C)  Resp: 18   No intake or output data in the 24 hours ending 02/03/14 1131 Filed Weights   02/03/14 0159  Weight: 71.8 kg (158 lb 4.6 oz)    Exam: General: Alert and awake, oriented x3, not in any acute distress. HEENT: anicteric sclera, pupils reactive to light and accommodation, EOMI CVS: S1-S2 clear, no murmur rubs or gallops Chest: clear to auscultation bilaterally, no wheezing, rales or rhonchi Abdomen: soft nontender, nondistended, normal bowel sounds, no organomegaly Extremities: no  cyanosis, clubbing or edema noted bilaterally Neuro: Cranial nerves II-XII intact, no focal neurological deficits  Data Reviewed: Basic Metabolic Panel:  Recent Labs Lab 02/01/14 02/02/14 1910  NA 139 137  K 4.5 4.3  CL  --  100  CO2  --  22  GLUCOSE  --  135*  BUN 21 29*  CREATININE 1.1 1.22  CALCIUM  --  8.4   Liver Function Tests:  Recent Labs Lab 02/01/14  AST 11*  ALT 8*  ALKPHOS 96   No results found for this basename: LIPASE, AMYLASE,  in the last 168 hours No results found for this basename: AMMONIA,  in the last 168 hours CBC:  Recent Labs Lab 02/01/14 02/02/14 1910  WBC 6.1 11.7*  NEUTROABS  --  11.3*  HGB 10.8* 11.1*  HCT 33* 34.4*  MCV  --  90.5  PLT 282 226   Cardiac Enzymes:  Recent Labs Lab 02/02/14 2210  TROPONINI <0.30   BNP (last 3 results) No results found for this basename: PROBNP,  in the last 8760 hours CBG:  Recent Labs Lab 02/03/14 0617 02/03/14 0923  GLUCAP 92 88    Micro Recent Results (from the past 240 hour(s))  MRSA PCR SCREENING     Status: None   Collection Time    02/03/14  7:46 AM      Result Value Ref Range Status   MRSA by PCR NEGATIVE  NEGATIVE Final   Comment:            The GeneXpert MRSA Assay (FDA     approved for NASAL specimens     only), is one component of a     comprehensive  MRSA colonization     surveillance program. It is not     intended to diagnose MRSA     infection nor to guide or     monitor treatment for     MRSA infections.     Studies: Ct Head Wo Contrast  02/03/2014   CLINICAL DATA:  Fever, nausea and vomiting. Decreased O2 saturation.  EXAM: CT HEAD WITHOUT CONTRAST  TECHNIQUE: Contiguous axial images were obtained from the base of the skull through the vertex without intravenous contrast.  COMPARISON:  CT of the head performed 05/29/2013  FINDINGS: There is no evidence of acute infarction, mass lesion, or intra- or extra-axial hemorrhage on CT.  Prominence of the ventricles and  sulci reflects moderate cortical volume loss. There is marked cerebellar atrophy or hypoplasia; an underlying arachnoid cyst again may be present. Scattered periventricular and subcortical white matter change likely reflects small vessel ischemic microangiopathy. An apparent chronic lacunar infarct is seen at the pons.  The fourth ventricle are within normal limits. The basal ganglia are unremarkable in appearance. The cerebral hemispheres demonstrate grossly normal gray-white differentiation. No mass effect or midline shift is seen.  There is no evidence of fracture; visualized osseous structures are unremarkable in appearance. The visualized portions of the orbits are within normal limits. The paranasal sinuses and mastoid air cells are well-aerated. No significant soft tissue abnormalities are seen.  IMPRESSION: 1. No acute intracranial pathology seen on CT. 2. Moderate cortical volume loss and scattered small vessel ischemic microangiopathy. Apparent chronic lacunar infarct at the pons. 3. Marked cerebellar atrophy or hypoplasia noted; underlying arachnoid cyst again may be present.   Electronically Signed   By: Garald Balding M.D.   On: 02/03/2014 00:01   Dg Chest Port 1 View  02/02/2014   CLINICAL DATA:  Short of breath.  EXAM: PORTABLE CHEST - 1 VIEW  COMPARISON:  08/09/2013  FINDINGS: Cardiac silhouette is normal in size. Aorta is uncoiled. No mediastinal or hilar masses. No acute findings in the lungs. No pleural effusion or pneumothorax. The bony thorax is diffusely demineralized but grossly intact. No change from the prior study.  IMPRESSION: No acute cardiopulmonary disease.   Electronically Signed   By: Lajean Manes M.D.   On: 02/02/2014 18:49    Scheduled Meds: . aspirin  81 mg Oral Daily  . famotidine  20 mg Oral Daily  . pantoprazole  40 mg Oral Daily  . polyethylene glycol  17 g Oral QODAY   Continuous Infusions: . sodium chloride 75 mL/hr at 02/03/14 0446       Time spent: 35  minutes    [facility]  [facility] Pager [phone] If 7PM-7AM, please contact night-coverage at [email or web address], password [facility] 02/03/2014, 11:31 AM  LOS: 1 day

## 2014-02-03 NOTE — Evaluation (Signed)
Physical Therapy Evaluation Patient Details Name: [name] MRN: [id number] DOB: [birth year] Today's Date: 02/03/2014 Time: 7341-9379 PT Time Calculation (min): 20 min  PT Assessment / Plan / Recommendation History of Present Illness  presenting with acute onset of weakness on the left.  Patient has a baseline right hemiplegia secondary to a previous stroke.  Head CT was negative, full workup in progress  Clinical Impression  Patient reports he was independent in bed to/from wheelchair transfers at [facility].  Currently, patient is requiring min assist for transfers.  Patient would benefit from PT to increased independence in transfers and mobility for return to [facility].      PT Assessment  Patient needs continued PT services    Follow Up Recommendations  Home health PT    Does the patient have the potential to tolerate intense rehabilitation      Barriers to Discharge        Equipment Recommendations  None recommended by PT    Recommendations for Other Services     Frequency Min 2X/week    Precautions / Restrictions Precautions Precautions: Fall Restrictions Weight Bearing Restrictions: No   Pertinent Vitals/Pain Denies pain      Mobility  Bed Mobility Overal bed mobility: Needs Assistance Bed Mobility: Supine to Sit;Sit to Supine Supine to sit: Min assist;HOB elevated Sit to supine: Min assist;HOB elevated General bed mobility comments: unable to scoot up in bed, requiring mod assistance Transfers Overall transfer level: Needs assistance Equipment used: None Transfers: Stand Pivot Transfers Stand pivot transfers: Min assist General transfer comment: stand pivot to/from bed/recliner Modified Rankin (Stroke Patients Only) Pre-Morbid Rankin Score: Moderately severe disability Modified Rankin: Moderately severe disability    Exercises     PT Diagnosis: Generalized weakness  PT Problem List: Decreased mobility;Decreased balance PT Treatment  Interventions: Functional mobility training;Therapeutic activities;Balance training     PT Goals(Current goals can be found in the care plan section) Acute Rehab PT Goals Patient Stated Goal: be able to do what I was PT Goal Formulation: With patient Time For Goal Achievement: 02/10/14 Potential to Achieve Goals: Good  Visit Information  Last PT Received On: 02/03/14 Assistance Needed: +1 History of Present Illness: presenting with acute onset of weakness on the left.  Patient has a baseline right hemiplegia secondary to a previous stroke.  Head CT was negative, full workup in progress       Prior [place] expects to be discharged to:: Assisted living Home Equipment: Wheelchair - manual Prior Function Level of Independence: Needs assistance Gait / Transfers Assistance Needed: reports he occasionally needs assistance with transfers to wheelchair, does not usually ambulate  Communication Communication: HOH    Cognition  Cognition Arousal/Alertness: Awake/alert Behavior During Therapy: WFL for tasks assessed/performed Overall Cognitive Status: Within Functional Limits for tasks assessed    Extremity/Trunk Assessment Upper Extremity Assessment Upper Extremity Assessment: Defer to OT evaluation Lower Extremity Assessment Lower Extremity Assessment: RLE deficits/detail;LLE deficits/detail RLE Deficits / Details: no volitional movement right LE, ankle with P/ROM [facility]; knee with flexion contracture LLE Deficits / Details: grossly 4/5, although limited ROM knee extension.  Per patient this is baseline. Cervical / Trunk Assessment Cervical / Trunk Assessment: Kyphotic   Balance Balance Overall balance assessment: Needs assistance Sitting-balance support: No upper extremity supported Sitting balance-Leahy Scale: Good  End of Session PT - End of Session Activity Tolerance: Patient tolerated treatment well Patient left: in bed;with call bell/phone  within reach;with bed alarm set  GP  Shanna Cisco, [place] 02/03/2014, 10:36 AM

## 2014-02-03 NOTE — Progress Notes (Signed)
Dr. Hartford Poli paged and made aware of Pt. Blood culture results - GRAM NEGATIVE RODS

## 2014-02-03 NOTE — Evaluation (Signed)
Clinical/Bedside Swallow Evaluation Patient Details  Name: [name] MRN: [id number] Date of Birth: [birth year]  Today's Date: 02/03/2014 Time: 1530-1600 SLP Time Calculation (min): 30 min  Past Medical History:  Past Medical History  Diagnosis Date  . GERD (gastroesophageal reflux disease)   . Hypertension   . Hearing loss     wears hearing aid - right side  . Leg swelling   . Trouble swallowing   . Bruises easily     due to coumadin  . Weakness     difficulty walking  . Contact lens/glasses fitting   . Stroke 1999  . Cancer   . 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 Gait disturbance   . Dyslipidemia   . COPD (chronic obstructive pulmonary disease)   . Peripheral vascular disease   . History of melanoma   . Diverticulosis   . Abnormality of gait 05/16/2013  . Choledocholithiasis with obstruction 06/28/13  . Embolic stroke   . PAF (paroxysmal atrial fibrillation)   . Hyperlipidemia   . Bradycardia   . Protein-calorie malnutrition, severe   . Ventricular tachycardia (paroxysmal)   . Obstructive sleep apnea of adult     uses cpap, pt does not know setting  . Personal history of fall 05/29/2013  . Fracture of femoral neck, right, closed 3114  . Long term (current) use of anticoagulants     PAF and embolic CVA  . Anemia, unspecified 07/05/2013  . Xerophthalmia 07/05/2013  . 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 Actinic keratosis   . Seborrheic keratosis    Past Surgical History:  Past Surgical History  Procedure Laterality Date  . Excision of melanoma  2009    left leg  . Melanoma excision      many melanoma removed in past  . Melanoma excision  10/11/2011    Procedure: MELANOMA EXCISION;  Surgeon: Pedro Earls, MD;  Location: [place];  Service: General;  Laterality: Left;  EXCISION melanoma left leg with full thickness skin grafting from left lower abdomen.  . Joint replacement  2012    r fem head fx  . Lung benign area removed   1970's  . Hernia repair  1983  . Melanoma excision   05/16/2012    Procedure: MELANOMA EXCISION;  Surgeon: Pedro Earls, MD;  Location: [facility];  Service: General;  Laterality: Left;  Nodule Removal of Melanoma on Left Shin  . Ercp N/A 07/04/2013    Procedure: ENDOSCOPIC RETROGRADE CHOLANGIOPANCREATOGRAPHY (ERCP);  Surgeon: Irene Shipper, MD;  Location: [facility];  Service: Endoscopy;  Laterality: N/A;   HPI:  This 80 year old male presented with nausea, vomiting, hypoxia. He was noted to have left-sided weakness consistent with TIA/CVA was felt to require transfer to [facility] for further stroke workup and neurology consult. Patient does have a history of atrial fib chronically on Coumadin.   CT scan negative.  MRI pending.  BSE ordered per Stroke Protocol.  Information taken from Nursing [facility] indicates patient on mechanical soft consistency with honey thick liquids but may have thin coffee, thin water in room, and thin milk with cereal prior to current admission.  Nursing contacted at [facility] and report mechanical soft diet /honey thick liquids.  Son contacted via phone and was not aware of his father on thickened liquids and that "he failed every swallow test he ever had".   Most current MBS completed at [facility] 2006.  No speech noted available from SNF.    Assessment / Plan /  Recommendation Clinical Impression  BSE completed.  Thick, white secretions cleared anterior in oral cavity prior to PO trials.   Tolerated spoon sips of thin water and cup sips when administered by SLP.  Severe coughing with apneic moment mmediately after cup sip when patient self administered.  Mulitple swallows required with puree, NTL, and HTL indicating weakness.  No difference noted between NTL and HTL unless patient has history of silent aspiration.  Recommend to proceed with dysphagia 2 (finely chopped) and nectar thick liquids with full supervision with all meals to cue patient as needed to utilize swallow strategies.  Recommend to proceed with objective evaluation of  MBS to assess risk for aspiration and determine safest, PO diet secondary to [place] significant for dysphagia and prior stroke and current weakness all risks for aspiration.  MBS to be completed 02/04/14.     Aspiration Risk  Moderate    Diet Recommendation Dysphagia 2 (Fine chop);Nectar-thick liquid   Liquid Administration via: Cup;No straw Medication Administration: Crushed with puree Supervision: Full supervision/cueing for compensatory strategies;Staff to assist with self feeding Compensations: Slow rate;Small sips/bites;Multiple dry swallows after each bite/sip Postural Changes and/or Swallow Maneuvers: Seated upright 90 degrees;Upright 30-60 min after meal    Other  Recommendations Recommended Consults: MBS Oral Care Recommendations: Oral care Q4 per protocol Other Recommendations: Order thickener from pharmacy;Prohibited food (jello, ice cream, thin soups);Remove water pitcher;Have oral suction available;Clarify dietary restrictions   Follow Up Recommendations  Skilled Nursing facility    Frequency and Duration min 2x/week  2 weeks       SLP Swallow Goals Refer to Care Plans   Swallow Study Prior Functional Status  Resident of SNF on mechanical soft diet consistency/honey thick liquids.  Information taken from Nursing [facility] indicates honey thick liquid but may have thin coffee, thin water, and thin mild with cereal.      General Date of Onset: 02/02/14 HPI: This 80 year old male presented with nausea, vomiting, hypoxia. He was noted to have left-sided weakness consistent with TIA/CVA was felt to require transfer to [facility] for further stroke workup and neurology consult. Patient does have a history of atrial fib chronically on Coumadin. Type of Study: Bedside swallow evaluation Previous Swallow Assessment: 1999 MBS, x2, 2006 MBS  Diet Prior to this Study: NPO Temperature Spikes Noted: No Respiratory Status: Nasal cannula History of Recent Intubation:  No Behavior/Cognition: Alert;Cooperative;Pleasant mood;Hard of hearing Oral Cavity - Dentition: Adequate natural dentition Self-Feeding Abilities: Needs assist;Able to feed self with adaptive devices Patient Positioning: Upright in bed Baseline Vocal Quality: Wet Volitional Cough: Strong Volitional Swallow: Able to elicit    Oral/Motor/Sensory Function Overall Oral Motor/Sensory Function: Impaired Labial ROM: Reduced right Labial Symmetry: Abnormal symmetry right Labial Strength: Reduced Labial Sensation: Reduced Lingual ROM: Reduced right Lingual Symmetry: Abnormal symmetry right Lingual Strength: Reduced Lingual Sensation: Reduced Facial Symmetry: Right droop Facial Strength: Reduced Facial Sensation: Reduced Velum: Within Functional Limits Mandible: Within Functional Limits   Ice Chips Ice chips: Impaired Pharyngeal Phase Impairments: Suspected delayed Swallow;Decreased hyoid-laryngeal movement   Thin Liquid Thin Liquid: Impaired Presentation: Cup;Spoon Pharyngeal  Phase Impairments: Suspected delayed Swallow;Decreased hyoid-laryngeal movement;Cough - Immediate    Nectar Thick Nectar Thick Liquid: Impaired Presentation: Cup Pharyngeal Phase Impairments: Suspected delayed Swallow;Other (comments) (audible swallow )   Honey Thick Honey Thick Liquid: Impaired Presentation: Cup;Spoon Pharyngeal Phase Impairments: Suspected delayed Swallow;Decreased hyoid-laryngeal movement;Other (comments) (audible swallow )   Puree Puree: Impaired Pharyngeal Phase Impairments: Suspected delayed Swallow;Decreased hyoid-laryngeal movement   Solid  GO    Solid: Impaired Oral Phase Impairments: Impaired mastication;Reduced lingual movement/coordination;Impaired anterior to posterior transit Pharyngeal Phase Impairments: Suspected delayed Swallow;Decreased hyoid-laryngeal movement      Sharman Crate Draper, [place] [facility] 02/03/2014,5:25 PM

## 2014-02-03 NOTE — Progress Notes (Addendum)
ANTICOAGULATION CONSULT NOTE - Initial Consult  Pharmacy Consult for Coumadin Indication: atrial fibrillation  Allergies  Allergen Reactions  . Sulfonamide Derivatives Rash    REACTION: rash    Vital Signs: Temp: 99.3 F (37.4 C) (03/01 0159) Temp src: Oral (03/01 0159) BP: 109/57 mmHg (03/01 0159) Pulse Rate: 66 (03/01 0159)  Labs:  Recent Labs  02/01/14 02/02/14 1910 02/02/14 2210  HGB 10.8* 11.1*  --   HCT 33* 34.4*  --   PLT 282 226  --   LABPROT  --   --  22.3*  INR 1.4*  --  2.03*  CREATININE 1.1 1.22  --   TROPONINI  --   --  <0.30    Medical History: Past Medical History  Diagnosis Date  . GERD (gastroesophageal reflux disease)   . Hypertension   . Hearing loss     wears hearing aid - right side  . Leg swelling   . Trouble swallowing   . Bruises easily     due to coumadin  . Weakness     difficulty walking  . Contact lens/glasses fitting   . Stroke 1999  . Cancer   . 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 Gait disturbance   . Dyslipidemia   . COPD (chronic obstructive pulmonary disease)   . Peripheral vascular disease   . History of melanoma   . Diverticulosis   . Abnormality of gait 05/16/2013  . Choledocholithiasis with obstruction 06/28/13  . Embolic stroke   . PAF (paroxysmal atrial fibrillation)   . Hyperlipidemia   . Bradycardia   . Protein-calorie malnutrition, severe   . Ventricular tachycardia (paroxysmal)   . Obstructive sleep apnea of adult     uses cpap, pt does not know setting  . Personal history of fall 05/29/2013  . Fracture of femoral neck, right, closed 3114  . Long term (current) use of anticoagulants     PAF and embolic CVA  . Anemia, unspecified 07/05/2013  . Xerophthalmia 07/05/2013  . 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 Actinic keratosis   . Seborrheic keratosis     Medications:  Prescriptions prior to admission  Medication Sig Dispense Refill  . acetaminophen (TYLENOL) 325 MG tablet Take 650 mg by mouth every 6 (six) hours as  needed for pain.       Marland Kitchen alum & mag hydroxide-simeth (MINTOX) 932-355-73 MG/5ML suspension Take 30 mLs by mouth every 6 (six) hours as needed for indigestion or heartburn.      . bisacodyl (DULCOLAX) 10 MG suppository Place 10 mg rectally every 12 (twelve) hours as needed for moderate constipation.      . enoxaparin (LOVENOX) 30 MG/0.3ML injection Inject 30 mg into the skin every 12 (twelve) hours. Take until INR is between 2-3 then stop Lovenox.      . hydroxypropyl methylcellulose (ISOPTO TEARS) 2.5 % ophthalmic solution Place 1 drop into both eyes 4 (four) times daily as needed (eye irritation).       . meclizine (ANTIVERT) 25 MG tablet Take 25 mg by mouth 3 (three) times daily as needed for dizziness.       . metoprolol tartrate (LOPRESSOR) 25 MG tablet Take 25 mg by mouth 2 (two) times daily.      Marland Kitchen omeprazole (PRILOSEC) 20 MG capsule Take 20 mg by mouth every morning.       . polyethylene glycol (MIRALAX / GLYCOLAX) packet Take 17 g by mouth every other day.      . ranitidine (ZANTAC)  150 MG tablet Take 150 mg by mouth at bedtime.      Marland Kitchen warfarin (COUMADIN) 5 MG tablet Take 5 mg by mouth every evening.      Dema Severin Petrolatum-Mineral Oil (SYSTANE NIGHTTIME) OINT Place 1 application into both eyes at bedtime.      . furosemide (LASIX) 20 MG tablet 20 mg every other day.        Scheduled:  . aspirin  81 mg Oral Daily  . famotidine  20 mg Oral Daily  . pantoprazole  40 mg Oral Daily  . polyethylene glycol  17 g Oral QODAY  . warfarin  5 mg Oral q1800  . Warfarin - Pharmacist Dosing Inpatient   Does not apply q1800   Infusions:  . sodium chloride      Assessment: 80yo male presents from NH w/ fever, N/V, and O2 down to 70s, unable to keep down APAP despite Zofran, admitted for further w/u including possible CVA, to continue Coumadin for Afib, admitted w/ therapeutic INR though was low 2/27.  Goal of Therapy:  INR 2-3   Plan:  Will resume home Coumadin dose of 5mg  daily though this  is new regimen and will require continued monitoring of INR for dose adjustments.  Wynona Neat, PharmD, BCPS  02/03/2014,2:05 AM    ADDENDUM: Pt has failed swallow screen and now to begin heparin while Coumadin on hold.  Given large jump in INR over 1d will hold off on heparin for now and check INR in am and only start if INR is trending back down.   VB 02/03/2014 2:52 AM

## 2014-02-03 NOTE — Consult Note (Signed)
Referring Physician: Roel Cluck    Chief Complaint: Left sided weakness  HPI: [name] is an 80 y.o. male with a history of stroke and atrial fibrillation on Coumadin who was noted at his facility to have trouble using his left side.  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  Today had trouble transferring and was unable to use his left arm to feed himself.  It is unclear when his symptoms began.  The patient was found to have a subtherapeutic INR on 2/27 and was being treated with Lovenox as well as his Coumadin.  Date last known well: Date: 02/01/2014 Time last known well: Unable to determine tPA Given: No: Unable to determine LKW and INR greater than 2.0 on Coumadin and Lovenox  Past Medical History  Diagnosis Date  . GERD (gastroesophageal reflux disease)   . Hypertension   . Hearing loss     wears hearing aid - right side  . Leg swelling   . Trouble swallowing   . Bruises easily     due to coumadin  . Weakness     difficulty walking  . Contact lens/glasses fitting   . Stroke 1999  . Cancer   . 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 Gait disturbance   . Dyslipidemia   . COPD (chronic obstructive pulmonary disease)   . Peripheral vascular disease   . History of melanoma   . Diverticulosis   . Abnormality of gait 05/16/2013  . Choledocholithiasis with obstruction 06/28/13  . Embolic stroke   . PAF (paroxysmal atrial fibrillation)   . Hyperlipidemia   . Bradycardia   . Protein-calorie malnutrition, severe   . Ventricular tachycardia (paroxysmal)   . Obstructive sleep apnea of adult     uses cpap, pt does not know setting  . Personal history of fall 05/29/2013  . Fracture of femoral neck, right, closed 3114  . Long term (current) use of anticoagulants     PAF and embolic CVA  . Anemia, unspecified 07/05/2013  . Xerophthalmia 07/05/2013  . 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 Actinic keratosis   . Seborrheic keratosis     Past Surgical History  Procedure  Laterality Date  . Excision of melanoma  2009    left leg  . Melanoma excision      many melanoma removed in past  . Melanoma excision  10/11/2011    Procedure: MELANOMA EXCISION;  Surgeon: Pedro Earls, MD;  Location: [facility];  Service: General;  Laterality: Left;  EXCISION melanoma left leg with full thickness skin grafting from left lower abdomen.  . Joint replacement  2012    r fem head fx  . Lung benign area removed   1970's  . Hernia repair  1983  . Melanoma excision  05/16/2012    Procedure: MELANOMA EXCISION;  Surgeon: Pedro Earls, MD;  Location: [facility];  Service: General;  Laterality: Left;  Nodule Removal of Melanoma on Left Shin  . Ercp N/A 07/04/2013    Procedure: ENDOSCOPIC RETROGRADE CHOLANGIOPANCREATOGRAPHY (ERCP);  Surgeon: Irene Shipper, MD;  Location: [facility];  Service: Endoscopy;  Laterality: N/A;    Family History  Problem Relation Age of Onset  . Heart disease Father 47  . Cancer Son     prostate  . Dementia Sister     One sister has dementia   Social History:  reports that he quit smoking about 49 years ago. His smoking use included Pipe. He has never  used smokeless tobacco. He reports that he does not drink alcohol or use illicit drugs.  Allergies:  Allergies  Allergen Reactions  . Sulfonamide Derivatives Rash    REACTION: rash    Medications:  I have reviewed the patient's current medications. Prior to Admission:  Prescriptions prior to admission  Medication Sig Dispense Refill  . acetaminophen (TYLENOL) 325 MG tablet Take 650 mg by mouth every 6 (six) hours as needed for pain.       Marland Kitchen alum & mag hydroxide-simeth (MINTOX) [id number] MG/5ML suspension Take 30 mLs by mouth every 6 (six) hours as needed for indigestion or heartburn.      . bisacodyl (DULCOLAX) 10 MG suppository Place 10 mg rectally every 12 (twelve) hours as needed for moderate constipation.      . enoxaparin (LOVENOX) 30 MG/0.3ML injection Inject 30 mg into the skin every 12  (twelve) hours. Take until INR is between 2-3 then stop Lovenox.      . hydroxypropyl methylcellulose (ISOPTO TEARS) 2.5 % ophthalmic solution Place 1 drop into both eyes 4 (four) times daily as needed (eye irritation).       . meclizine (ANTIVERT) 25 MG tablet Take 25 mg by mouth 3 (three) times daily as needed for dizziness.       . metoprolol tartrate (LOPRESSOR) 25 MG tablet Take 25 mg by mouth 2 (two) times daily.      Marland Kitchen omeprazole (PRILOSEC) 20 MG capsule Take 20 mg by mouth every morning.       . polyethylene glycol (MIRALAX / GLYCOLAX) packet Take 17 g by mouth every other day.      . ranitidine (ZANTAC) 150 MG tablet Take 150 mg by mouth at bedtime.      Marland Kitchen warfarin (COUMADIN) 5 MG tablet Take 5 mg by mouth every evening.      Dema Severin Petrolatum-Mineral Oil (SYSTANE NIGHTTIME) OINT Place 1 application into both eyes at bedtime.      . furosemide (LASIX) 20 MG tablet 20 mg every other day.        Scheduled: . aspirin  81 mg Oral Daily  . famotidine  20 mg Oral Daily  . pantoprazole  40 mg Oral Daily  . polyethylene glycol  17 g Oral QODAY    ROS: History obtained from the patient  General ROS: negative for - chills, fatigue, fever, night sweats, weight gain or weight loss Psychological ROS: negative for - behavioral disorder, hallucinations, memory difficulties, mood swings or suicidal ideation Ophthalmic ROS: blind in the left eye ENT ROS: HOH Allergy and Immunology ROS: negative for - hives or itchy/watery eyes Hematological and Lymphatic ROS: bruising Endocrine ROS: negative for - galactorrhea, hair pattern changes, polydipsia/polyuria or temperature intolerance Respiratory ROS: negative for - cough, hemoptysis, shortness of breath or wheezing Cardiovascular ROS: negative for - chest pain, dyspnea on exertion, edema or irregular heartbeat Gastrointestinal ROS: negative for - abdominal pain, diarrhea, hematemesis, nausea/vomiting or stool incontinence Genito-Urinary ROS:  negative for - dysuria, hematuria, incontinence or urinary frequency/urgency Musculoskeletal ROS: negative for - joint swelling or muscular weakness Neurological ROS: as noted in HPI Dermatological ROS: negative for rash and skin lesion changes  Physical Examination: Blood pressure 109/57, pulse 66, temperature 99.3 F (37.4 C), temperature source Oral, resp. rate 18, height 5\' 2"  (1.575 m), weight 71.8 kg (158 lb 4.6 oz), SpO2 98.00%.  Neurologic Examination: Mental Status: Alert, oriented, thought content appropriate.  Speech fluent without evidence of aphasia.  Able to follow simple commands without  difficulty. Cranial Nerves: II: Discs flat bilaterally; Blind in the left eye, Left pupil clouded, right pupil round, reactive to light  III,IV, VI: ptosis not present, extra-ocular motions intact bilaterally V,VII: right facial droop, facial light touch sensation normal bilaterally VIII: hearing decreased IX,X: gag reflex reduced XI: shoulder shrug decreased on the right XII: midline tongue extension Motor: Right : Upper extremity   0/5    Left:     Upper extremity   5/5  Lower extremity   0/5     Lower extremity   5-/5 Increased tone on the right Sensory: Pinprick and light touch intact throughout, bilaterally Deep Tendon Reflexes: Increased on the right Plantars: Right: upgoing   Left: upgoing Cerebellar: Finger to nose testing with dysmetria on the left Gait: Unable to test due to weakness CV: pulses palpable throughout     Laboratory Studies:  Basic Metabolic Panel:  Recent Labs Lab 02/01/14 02/02/14 1910  NA 139 137  K 4.5 4.3  CL  --  100  CO2  --  22  GLUCOSE  --  135*  BUN 21 29*  CREATININE 1.1 1.22  CALCIUM  --  8.4    Liver Function Tests:  Recent Labs Lab 02/01/14  AST 11*  ALT 8*  ALKPHOS 96   No results found for this basename: LIPASE, AMYLASE,  in the last 168 hours No results found for this basename: AMMONIA,  in the last 168  hours  CBC:  Recent Labs Lab 02/01/14 02/02/14 1910  WBC 6.1 11.7*  NEUTROABS  --  11.3*  HGB 10.8* 11.1*  HCT 33* 34.4*  MCV  --  90.5  PLT 282 226    Cardiac Enzymes:  Recent Labs Lab 02/02/14 2210  TROPONINI <0.30    BNP: No components found with this basename: POCBNP,   CBG: No results found for this basename: GLUCAP,  in the last 168 hours  Microbiology: Results for orders placed during the hospital encounter of 08/09/13  CULTURE, BLOOD (ROUTINE X 2)     Status: None   Collection Time    08/09/13  6:50 PM      Result Value Ref Range Status   Specimen Description BLOOD LEFT ANTECUBITAL   Final   Special Requests     Final   Value: BOTTLES DRAWN AEROBIC AND ANAEROBIC NO VOLUME INDICATED   Culture  Setup Time     Final   Value: 08/10/2013 03:15     Performed at [facility]   Culture     Final   Value: NO GROWTH 5 DAYS     Performed at [facility]   Report Status 08/16/2013 FINAL   Final  CULTURE, BLOOD (ROUTINE X 2)     Status: None   Collection Time    08/09/13  7:10 PM      Result Value Ref Range Status   Specimen Description BLOOD L HAND   Final   Special Requests A BOTTLES DRAWN AEROBIC ONLY 3CC   Final   Culture  Setup Time     Final   Value: 08/10/2013 03:15     Performed at [facility]   Culture     Final   Value: STAPHYLOCOCCUS SPECIES (COAGULASE NEGATIVE)     Note: THE SIGNIFICANCE OF ISOLATING THIS ORGANISM FROM A SINGLE SET OF BLOOD CULTURES WHEN MULTIPLE SETS ARE DRAWN IS UNCERTAIN. PLEASE NOTIFY THE MICROBIOLOGY DEPARTMENT WITHIN ONE WEEK IF SPECIATION AND SENSITIVITIES ARE REQUIRED.  0107 Note: Gram Stain Report Called to,Read Back By and Verified With: Dorene Ar 08/11/2013 [facility]     Performed at [facility]   Report Status 08/13/2013 FINAL   Final  MRSA PCR SCREENING     Status: None   Collection Time    08/10/13  1:12 AM      Result Value Ref Range Status   MRSA by PCR NEGATIVE  NEGATIVE Final    Comment:            The GeneXpert MRSA Assay (FDA     approved for NASAL specimens     only), is one component of a     comprehensive MRSA colonization     surveillance program. It is not     intended to diagnose MRSA     infection nor to guide or     monitor treatment for     MRSA infections.  URINE CULTURE     Status: None   Collection Time    08/10/13  4:12 AM      Result Value Ref Range Status   Specimen Description URINE, CLEAN CATCH   Final   Special Requests NONE   Final   Culture  Setup Time     Final   Value: 08/10/2013 12:58     Performed at [facility]     Final   Value: NO GROWTH     Performed at [facility]   Culture     Final   Value: NO GROWTH     Performed at [facility]   Report Status 08/11/2013 FINAL   Final  CULTURE, ROUTINE-ABSCESS     Status: None   Collection Time    08/11/13  1:30 PM      Result Value Ref Range Status   Specimen Description GALL BLADDER   Final   Special Requests Normal   Final   Gram Stain     Final   Value: ABUNDANT WBC PRESENT, PREDOMINANTLY PMN     NO SQUAMOUS EPITHELIAL CELLS SEEN     ABUNDANT GRAM POSITIVE COCCI     IN PAIRS     Performed at [facility]   Culture     Final   Value: ABUNDANT STREPTOCOCCUS GROUP D;high probability for S.bovis     Performed at [facility]   Report Status 08/14/2013 FINAL   Final   Organism ID, Bacteria STREPTOCOCCUS GROUP D;high probability for S.bovis   Final  CULTURE, BLOOD (ROUTINE X 2)     Status: None   Collection Time    08/12/13 10:05 AM      Result Value Ref Range Status   Specimen Description BLOOD RIGHT ARM   Final   Special Requests BOTTLES DRAWN AEROBIC AND ANAEROBIC 5 CC EA   Final   Culture  Setup Time     Final   Value: 08/12/2013 16:14     Performed at [facility]   Culture     Final   Value: NO GROWTH 5 DAYS     Performed at [facility]   Report Status 08/18/2013 FINAL   Final  CULTURE,  BLOOD (ROUTINE X 2)     Status: None   Collection Time    08/12/13 10:17 AM      Result Value Ref Range Status   Specimen Description BLOOD RIGHT ARM   Final   Special Requests BOTTLES DRAWN AEROBIC AND ANAEROBIC 9 CC  EA   Final   Culture  Setup Time     Final   Value: 08/12/2013 16:15     Performed at [facility]   Culture     Final   Value: NO GROWTH 5 DAYS     Performed at [facility]   Report Status 08/18/2013 FINAL   Final    Coagulation Studies:  Recent Labs  02/01/14 02/02/14 2210  LABPROT  --  22.3*  INR 1.4* 2.03*    Urinalysis:  Recent Labs Lab 02/02/14 1941  COLORURINE AMBER*  LABSPEC 1.021  PHURINE 7.0  GLUCOSEU NEGATIVE  HGBUR NEGATIVE  BILIRUBINUR SMALL*  KETONESUR NEGATIVE  PROTEINUR NEGATIVE  UROBILINOGEN 4.0*  NITRITE NEGATIVE  LEUKOCYTESUR NEGATIVE    Lipid Panel:    Component Value Date/Time   CHOL 240* 04/26/2007 1027   TRIG 228* 04/26/2007 1027   HDL 37.8* 04/26/2007 1027   CHOLHDL 6.3 CALC 04/26/2007 1027   VLDL 46* 04/26/2007 1027    HgbA1C:  No results found for this basename: HGBA1C    Urine Drug Screen:   No results found for this basename: labopia, cocainscrnur, labbenz, amphetmu, thcu, labbarb    Alcohol Level: No results found for this basename: ETH,  in the last 168 hours  Other results: EKG: sinus rhythm at 71 bpm.  Imaging: Ct Head Wo Contrast  02/03/2014   CLINICAL DATA:  Fever, nausea and vomiting. Decreased O2 saturation.  EXAM: CT HEAD WITHOUT CONTRAST  TECHNIQUE: Contiguous axial images were obtained from the base of the skull through the vertex without intravenous contrast.  COMPARISON:  CT of the head performed 05/29/2013  FINDINGS: There is no evidence of acute infarction, mass lesion, or intra- or extra-axial hemorrhage on CT.  Prominence of the ventricles and sulci reflects moderate cortical volume loss. There is marked cerebellar atrophy or hypoplasia; an underlying arachnoid cyst again may be  present. Scattered periventricular and subcortical white matter change likely reflects small vessel ischemic microangiopathy. An apparent chronic lacunar infarct is seen at the pons.  The fourth ventricle are within normal limits. The basal ganglia are unremarkable in appearance. The cerebral hemispheres demonstrate grossly normal gray-white differentiation. No mass effect or midline shift is seen.  There is no evidence of fracture; visualized osseous structures are unremarkable in appearance. The visualized portions of the orbits are within normal limits. The paranasal sinuses and mastoid air cells are well-aerated. No significant soft tissue abnormalities are seen.  IMPRESSION: 1. No acute intracranial pathology seen on CT. 2. Moderate cortical volume loss and scattered small vessel ischemic microangiopathy. Apparent chronic lacunar infarct at the pons. 3. Marked cerebellar atrophy or hypoplasia noted; underlying arachnoid cyst again may be present.   Electronically Signed   By: Garald Balding M.D.   On: 02/03/2014 00:01   Dg Chest Port 1 View  02/02/2014   CLINICAL DATA:  Short of breath.  EXAM: PORTABLE CHEST - 1 VIEW  COMPARISON:  08/09/2013  FINDINGS: Cardiac silhouette is normal in size. Aorta is uncoiled. No mediastinal or hilar masses. No acute findings in the lungs. No pleural effusion or pneumothorax. The bony thorax is diffusely demineralized but grossly intact. No change from the prior study.  IMPRESSION: No acute cardiopulmonary disease.   Electronically Signed   By: Lajean Manes M.D.   On: 02/02/2014 18:49    Assessment: 80 y.o. male presenting with acute onset of weakness on the left.  Patient has a baseline right hemiplegia secondary to a previous stroke.  LKW difficult to determine.  Patient on Coumadin with therapeutic INR and therefore not a tPA candidate.  At this point per the patient has improved use of his left.  Unclear if really at baseline though.  Patient also has been febrile and  white blood cell count elevated.  Can not rule out the influence of infection.  Further work up recommended.  Head CT reviewed and shows no acute changes.    Stroke Risk Factors - atrial fibrillation, hyperlipidemia and hypertension  Plan: 1. HgbA1c, fasting lipid panel 2. MRI, MRA  of the brain without contrast 3. PT consult, OT consult, Speech consult 4. Echocardiogram 5. Carotid dopplers 6. Prophylactic therapy-Continue Coumadin 7. Risk factor modification 8. Telemetry monitoring 9. Frequent neuro checks   Alexis Goodell, MD [facility] [phone] 02/03/2014, 3:40 AM

## 2014-02-03 NOTE — Progress Notes (Signed)
ANTIBIOTIC CONSULT NOTE - INITIAL  Pharmacy Consult for zosyn Indication: GNR bacteremia  Allergies  Allergen Reactions  . Sulfonamide Derivatives Rash    REACTION: rash    Patient Measurements: Height: 5\' 2"  (157.5 cm) Weight: 158 lb 4.6 oz (71.8 kg) IBW/kg (Calculated) : 54.6 Adjusted Body Weight:   Vital Signs: Temp: 98 F (36.7 C) (03/01 1354) Temp src: Oral (03/01 1354) BP: 115/70 mmHg (03/01 1354) Pulse Rate: 82 (03/01 1354) Intake/Output from previous day:   Intake/Output from this shift: Total I/O In: -  Out: 100 [Urine:100]  Labs:  Recent Labs  02/01/14 02/02/14 1910  WBC 6.1 11.7*  HGB 10.8* 11.1*  PLT 282 226  CREATININE 1.1 1.22   Estimated Creatinine Clearance: 33.6 ml/min (by C-G formula based on Cr of 1.22). No results found for this basename: VANCOTROUGH, Corlis Leak, VANCORANDOM, [place], GENTPEAK, GENTRANDOM, [place], TOBRAPEAK, TOBRARND, AMIKACINPEAK, AMIKACINTROU, AMIKACIN,  in the last 72 hours   Microbiology: Recent Results (from the past 720 hour(s))  CULTURE, BLOOD (ROUTINE X 2)     Status: None   Collection Time    02/02/14  7:10 PM      Result Value Ref Range Status   Specimen Description BLOOD RIGHT HAND   Final   Special Requests BOTTLES DRAWN AEROBIC AND ANAEROBIC 4CC EACH   Final   Culture  Setup Time     Final   Value: 02/03/2014 02:58     Performed at [facility]   Culture     Final   Value: GRAM NEGATIVE RODS     Note: Gram Stain Report Called to,Read Back By and Verified With: A. TEPE 02/03/14 @ 12:20PM BY RUSCOE A.     Performed at [facility]   Report Status PENDING   Incomplete  CULTURE, BLOOD (ROUTINE X 2)     Status: None   Collection Time    02/02/14  7:30 PM      Result Value Ref Range Status   Specimen Description BLOOD RIGHT HAND   Final   Special Requests BOTTLES DRAWN AEROBIC AND ANAEROBIC 4CC   Final   Culture  Setup Time     Final   Value: 02/03/2014 02:58     Performed at [facility]   Culture     Final   Value: GRAM NEGATIVE RODS     Note: Gram Stain Report Called to,Read Back By and Verified With: A. TEPE 02/03/14 @ 12:20PM BY RUSCOE A.     Performed at [facility]   Report Status PENDING   Incomplete  MRSA PCR SCREENING     Status: None   Collection Time    02/03/14  7:46 AM      Result Value Ref Range Status   MRSA by PCR NEGATIVE  NEGATIVE Final   Comment:            The GeneXpert MRSA Assay (FDA     approved for NASAL specimens     only), is one component of a     comprehensive MRSA colonization     surveillance program. It is not     intended to diagnose MRSA     infection nor to guide or     monitor treatment for     MRSA infections.    Medical History: Past Medical History  Diagnosis Date  . GERD (gastroesophageal reflux disease)   . Hypertension   . Hearing loss     wears hearing aid - right  side  . Leg swelling   . Trouble swallowing   . Bruises easily     due to coumadin  . Weakness     difficulty walking  . Contact lens/glasses fitting   . Stroke 1999  . Cancer   . 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 Gait disturbance   . Dyslipidemia   . COPD (chronic obstructive pulmonary disease)   . Peripheral vascular disease   . History of melanoma   . Diverticulosis   . Abnormality of gait 05/16/2013  . Choledocholithiasis with obstruction 06/28/13  . Embolic stroke   . PAF (paroxysmal atrial fibrillation)   . Hyperlipidemia   . Bradycardia   . Protein-calorie malnutrition, severe   . Ventricular tachycardia (paroxysmal)   . Obstructive sleep apnea of adult     uses cpap, pt does not know setting  . Personal history of fall 05/29/2013  . Fracture of femoral neck, right, closed 3114  . Long term (current) use of anticoagulants     PAF and embolic CVA  . Anemia, unspecified 07/05/2013  . Xerophthalmia 07/05/2013  . 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 Actinic keratosis   . Seborrheic keratosis     Medications:  Anti-infectives    Start     Dose/Rate Route Frequency Ordered Stop   02/03/14 1800  piperacillin-tazobactam (ZOSYN) IVPB 3.375 g     3.375 g 12.5 mL/hr over 240 Minutes Intravenous Every 8 hours 02/03/14 1721       Assessment: 51 yom presented to the hospital with N/V and hypoxia. Blood cultures are now growing gram negative rod. Pt is afebrile and WBC is 11.7 as of yesterday. Scr is 1.22 but pt is elderly so CrCl is only ~60ml/min.   Zosyn 3/1>>  2/28 Blood - GNR 2/2 3/1 MRSA - NEG  Goal of Therapy:  Eradication of infection  Plan:  1. Zosyn 3. 375gm IV Q8H (4 hr inf) 2. F/u renal fxn, C&S, clinical status  Laritza Vokes, Rande Lawman 02/03/2014,5:22 PM

## 2014-02-03 NOTE — Progress Notes (Signed)
VASCULAR LAB PRELIMINARY  PRELIMINARY  PRELIMINARY  PRELIMINARY  Carotid Dopplers completed.    Preliminary report:  1-39% ICA stenosis.  Vertebral artery flow is antegrade.  Bleu Minerd, RVT 02/03/2014, 12:07 PM

## 2014-02-03 NOTE — Progress Notes (Signed)
[facility] progress note. Chief complaint. Transfer note. History of present illness. This 80 year old male presented with nausea, vomiting, hypoxia. He was noted to have left-sided weakness consistent with TIA/CVA was felt to require transfer to [facility] for further stroke workup and neurology consult. Patient does have a history of atrial fib chronically on Coumadin. Came to see the patient at bedside since his arrival at [facility] and find him alert and in no distress. Vital signs. Temperature 99.3, pulse 66, respiration 18, blood pressure 109/57. O2 sats 98%. General appearance. Frail elderly male who is alert and in no distress. Cardiac. Regular with occasional irregular beats. No carotid bruits. Lungs. Sounds are clear. Abdomen. Soft with positive bowel sounds. Neurologic. Cranial nerves 2-12 grossly intact. Grip strengths are fair and equal. Lower extremity weak bilaterally with partial flexion contractures. Impression/plan. Problem #1 left-sided weakness. TIA/CVA workup. Prior CT scan unremarkable. I will notify neurology of patient's arrival. Problem #2. Atrial fib. Patient on Coumadin did not pass a swallow evaluation. I've discontinued Coumadin for now and placed the patient on heparin per pharmacy protocol.

## 2014-02-04 ENCOUNTER — Inpatient Hospital Stay (HOSPITAL_COMMUNITY): Payer: Medicare Other

## 2014-02-04 DIAGNOSIS — I639 Cerebral infarction, unspecified: Secondary | ICD-10-CM | POA: Diagnosis present

## 2014-02-04 DIAGNOSIS — A419 Sepsis, unspecified organism: Secondary | ICD-10-CM | POA: Diagnosis present

## 2014-02-04 DIAGNOSIS — E860 Dehydration: Secondary | ICD-10-CM

## 2014-02-04 DIAGNOSIS — I359 Nonrheumatic aortic valve disorder, unspecified: Secondary | ICD-10-CM

## 2014-02-04 DIAGNOSIS — I6350 Cerebral infarction due to unspecified occlusion or stenosis of unspecified cerebral artery: Secondary | ICD-10-CM

## 2014-02-04 LAB — COMPREHENSIVE METABOLIC PANEL
ALT: 194 U/L — ABNORMAL HIGH (ref 0–53)
AST: 143 U/L — AB (ref 0–37)
Albumin: 2.5 g/dL — ABNORMAL LOW (ref 3.5–5.2)
Alkaline Phosphatase: 234 U/L — ABNORMAL HIGH (ref 39–117)
BILIRUBIN TOTAL: 1.5 mg/dL — AB (ref 0.3–1.2)
BUN: 32 mg/dL — ABNORMAL HIGH (ref 6–23)
CHLORIDE: 107 meq/L (ref 96–112)
CO2: 25 meq/L (ref 19–32)
CREATININE: 1.31 mg/dL (ref 0.50–1.35)
Calcium: 8.1 mg/dL — ABNORMAL LOW (ref 8.4–10.5)
GFR calc Af Amer: 53 mL/min — ABNORMAL LOW (ref >90)
GFR, EST NON AFRICAN AMERICAN: 46 mL/min — AB (ref >90)
Glucose, Bld: 90 mg/dL (ref 70–99)
Potassium: 4.2 mEq/L (ref 3.7–5.3)
Sodium: 142 mEq/L (ref 137–147)
Total Protein: 5.7 g/dL — ABNORMAL LOW (ref 6.0–8.3)

## 2014-02-04 LAB — CBC
HCT: 32 % — ABNORMAL LOW (ref 39.0–52.0)
HEMOGLOBIN: 10.1 g/dL — AB (ref 13.0–17.0)
MCH: 29 pg (ref 26.0–34.0)
MCHC: 31.6 g/dL (ref 30.0–36.0)
MCV: 92 fL (ref 78.0–100.0)
PLATELETS: 194 10*3/uL (ref 150–400)
RBC: 3.48 MIL/uL — AB (ref 4.22–5.81)
RDW: 14.1 % (ref 11.5–15.5)
WBC: 12.6 10*3/uL — AB (ref 4.0–10.5)

## 2014-02-04 LAB — GLUCOSE, CAPILLARY
GLUCOSE-CAPILLARY: 96 mg/dL (ref 70–99)
Glucose-Capillary: 91 mg/dL (ref 70–99)
Glucose-Capillary: 109 mg/dL — ABNORMAL HIGH (ref 70–99)
Glucose-Capillary: 128 mg/dL — ABNORMAL HIGH (ref 70–99)

## 2014-02-04 LAB — PROTIME-INR
INR: 2.22 — ABNORMAL HIGH (ref 0.00–1.49)
Prothrombin Time: 23.9 seconds — ABNORMAL HIGH (ref 11.6–15.2)

## 2014-02-04 MED ORDER — ENSURE COMPLETE PO LIQD: 237.0000 mL | Freq: Two times a day (BID) | ORAL | Status: DC | PRN

## 2014-02-04 MED ORDER — ENSURE COMPLETE PO LIQD
237.0000 mL | ORAL | Status: DC
  Administered 2014-02-06: 237 mL via ORAL

## 2014-02-04 NOTE — Progress Notes (Signed)
   CARE MANAGEMENT NOTE 02/04/2014  Patient:  [name],[name]   Account Number:  [id number]  Date Initiated:  02/04/2014  Documentation initiated by:  Olga Coaster  Subjective/Objective Assessment:   ADMITTED FOR TIA/ STROKE WORKUP     Action/Plan:   PATIENT RESIDES IN NURSING FACILITY; [facility] WORKER REFERRAL PLACED   Anticipated DC Date:  02/08/2014   Anticipated DC Plan:  SKILLED NURSING FACILITY  In-house referral  Clinical Social Worker      DC Planning Services  CM consult          Status of service:  In process, will continue to follow Medicare Important Message given?  NA - LOS <3 / Initial given by admissions (If response is "NO", the following Medicare IM given date fields will be blank)  Per UR Regulation:  Reviewed for med. necessity/level of care/duration of stay  Comments:  3/2/2015Mindi Slicker RN,BSN,MHA [phone]

## 2014-02-04 NOTE — Procedures (Signed)
Objective Swallowing Evaluation: Modified Barium Swallowing Study  Patient Details  Name: [name] MRN: [id number] Date of Birth: 1918/12/27  Today's Date: 02/04/2014 Time: 1040-1100 SLP Time Calculation (min): 20 min  Past Medical History:  Past Medical History  Diagnosis Date  . GERD (gastroesophageal reflux disease)   . Hypertension   . Hearing loss     wears hearing aid - right side  . Leg swelling   . Trouble swallowing   . Bruises easily     due to coumadin  . Weakness     difficulty walking  . Contact lens/glasses fitting   . Stroke 1999  . Cancer   . 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 Gait disturbance   . Dyslipidemia   . COPD (chronic obstructive pulmonary disease)   . Peripheral vascular disease   . History of melanoma   . Diverticulosis   . Abnormality of gait 05/16/2013  . Choledocholithiasis with obstruction 06/28/13  . Embolic stroke   . PAF (paroxysmal atrial fibrillation)   . Hyperlipidemia   . Bradycardia   . Protein-calorie malnutrition, severe   . Ventricular tachycardia (paroxysmal)   . Obstructive sleep apnea of adult     uses cpap, pt does not know setting  . Personal history of fall 05/29/2013  . Fracture of femoral neck, right, closed 3114  . Long term (current) use of anticoagulants     PAF and embolic CVA  . Anemia, unspecified 07/05/2013  . Xerophthalmia 07/05/2013  . 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 Actinic keratosis   . Seborrheic keratosis    Past Surgical History:  Past Surgical History  Procedure Laterality Date  . Excision of melanoma  2009    left leg  . Melanoma excision      many melanoma removed in past  . Melanoma excision  10/11/2011    Procedure: MELANOMA EXCISION;  Surgeon: Pedro Earls, MD;  Location: [place];  Service: General;  Laterality: Left;  EXCISION melanoma left leg with full thickness skin grafting from left lower abdomen.  . Joint replacement  2012    r fem head fx  . Lung benign area removed   1970's  . Hernia  repair  1983  . Melanoma excision  05/16/2012    Procedure: MELANOMA EXCISION;  Surgeon: Pedro Earls, MD;  Location: [facility];  Service: General;  Laterality: Left;  Nodule Removal of Melanoma on Left Shin  . Ercp N/A 07/04/2013    Procedure: ENDOSCOPIC RETROGRADE CHOLANGIOPANCREATOGRAPHY (ERCP);  Surgeon: Irene Shipper, MD;  Location: [facility];  Service: Endoscopy;  Laterality: N/A;   HPI:  This 80 year old male presented with nausea, vomiting, hypoxia. He was noted to have left-sided weakness consistent with TIA/CVA was felt to require transfer to [facility] for further stroke workup and neurology consult. Patient does have a history of atrial fib chronically on Coumadin.  Information taken from Nursing [facility] indicates patient on mechanical soft consistency with honey thick liquids but may have thin coffee, thin water in room, and thin milk with cereal prior to current admission.  Nursing contacted at [facility] and report mechanical soft diet /honey thick liquids.  Son contacted via phone and was not aware of his father on thickened liquids and that "he failed every swallow test he ever had".   Most current MBS completed at [facility] 2006.  No speech noted available from SNF.       Assessment / Plan / Recommendation Clinical Impression  Dysphagia Diagnosis:  Mild oral phase dysphagia;Moderate pharyngeal phase dysphagia;Severe pharyngeal phase dysphagia Clinical impression: Pt. demonstrated mild oral and moderate-severe sensorimotor based pharyngeal dysphagia.  Decreased pharyngeal sensation, reduced laryngeal elevation, tongue base retraction resulted in silent and frank aspiration of thin and nectar thick (large sip).  Therapeutic intervention with chin tuck technique ineffictive and aided in aspiration events.  Vallecular and pyriform residue present (mild).  Pt. appears to be a chronic aspirator per history and risk remains despite diet/liquid texture somewhat due to behavioral and cognitive impact  (decreased overall awareness, increased sip size, decreased sensation of residue).  Recommend texture upgrade to Dys 3 and continue nectar thick liquids, SMALL sips, no straws, pills whole applesauce.  May need to discuss liquid downgrade with pt. versus comfort/quality of life with thin liquids if difficulty following precautions.      Treatment Recommendation  Therapy as outlined in treatment plan below    Diet Recommendation Dysphagia 3 (Mechanical Soft);Nectar-thick liquid   Liquid Administration via: Cup;No straw Medication Administration: Whole meds with puree Supervision: Patient able to self feed;Full supervision/cueing for compensatory strategies Compensations: Slow rate;Small sips/bites;Multiple dry swallows after each bite/sip;Clear throat intermittently;Check for pocketing Postural Changes and/or Swallow Maneuvers: Seated upright 90 degrees;Upright 30-60 min after meal    Other  Recommendations Oral Care Recommendations: Oral care BID   Follow Up Recommendations  Skilled Nursing facility    Frequency and Duration min 2x/week  2 weeks   Pertinent Vitals/Pain WDL            Reason for Referral Objectively evaluate swallowing function   Oral Phase Oral Preparation/Oral Phase Oral Phase: Impaired Oral - Nectar Oral - Nectar Cup: Right pocketing in lateral sulci Oral - Thin Oral - Thin Cup: Right pocketing in lateral sulci   Pharyngeal Phase Pharyngeal Phase Pharyngeal Phase: Impaired Pharyngeal - Nectar Pharyngeal - Nectar Teaspoon: Delayed swallow initiation;Premature spillage to valleculae;Pharyngeal residue - valleculae;Pharyngeal residue - pyriform sinuses;Reduced tongue base retraction;Reduced laryngeal elevation Pharyngeal - Nectar Cup: Delayed swallow initiation;Pharyngeal residue - valleculae;Pharyngeal residue - pyriform sinuses;Reduced tongue base retraction;Reduced laryngeal elevation;Penetration/Aspiration during swallow;Premature spillage to pyriform  sinuses;Reduced anterior laryngeal mobility;Reduced airway/laryngeal closure;Significant aspiration (Amount) (large sip) Penetration/Aspiration details (nectar cup): Material enters airway, passes BELOW cords without attempt by patient to eject out (silent aspiration) Pharyngeal - Thin Pharyngeal - Thin Teaspoon: Delayed swallow initiation;Premature spillage to pyriform sinuses;Premature spillage to valleculae;Penetration/Aspiration during swallow;Reduced tongue base retraction;Reduced airway/laryngeal closure;Reduced laryngeal elevation;Pharyngeal residue - valleculae;Pharyngeal residue - pyriform sinuses;Moderate aspiration Penetration/Aspiration details (thin teaspoon): Material enters airway, passes BELOW cords without attempt by patient to eject out (silent aspiration) Pharyngeal - Thin Cup: Penetration/Aspiration during swallow;Reduced laryngeal elevation;Reduced airway/laryngeal closure;Pharyngeal residue - pyriform sinuses;Pharyngeal residue - valleculae;Delayed swallow initiation;Premature spillage to valleculae (aspiration during chin tuck ) Penetration/Aspiration details (thin cup): Material enters airway, passes BELOW cords without attempt by patient to eject out (silent aspiration) Pharyngeal - Solids Pharyngeal - Regular: Within functional limits  Cervical Esophageal Phase    GO    Cervical Esophageal Phase Cervical Esophageal Phase:  (? tight UES)         Cranford Mon[employer] [phone]  02/04/2014

## 2014-02-04 NOTE — Progress Notes (Signed)
Stroke Team Progress Note  HISTORY [name] is an 80 y.o. male with a history of stroke and atrial fibrillation on Coumadin who was noted at his facility to have trouble using his left side. 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 Today 02/03/2014 he had trouble transferring and was unable to use his left arm to feed himself. It is unclear when his symptoms began. The patient was found to have a subtherapeutic INR on 2/27 and was being treated with Lovenox as well as his Coumadin. He was not considered for TPA as delay in arrival and INR greater than 2.0 on Coumadin and Lovenox   SUBJECTIVE His son is at the bedside.  Overall he feels his condition is stable.   OBJECTIVE Most recent Vital Signs: Filed Vitals:   02/04/14 0145 02/04/14 0551 02/04/14 0755 02/04/14 1139  BP: 100/45 127/61 145/73 140/68  Pulse: 68 54 56 54  Temp: 100.4 F (38 C) 98.8 F (37.1 C) 97.2 F (36.2 C) 97.9 F (36.6 C)  TempSrc: Oral Oral Oral Oral  Resp: 16 16 17 18   Height:      Weight:      SpO2: 100% 99% 100% 98%   CBG (last 3)   Recent Labs  02/03/14 2134 02/04/14 0753 02/04/14 1138  GLUCAP 122* 91 109*    IV Fluid Intake:     MEDICATIONS  . aspirin  81 mg Oral Daily  . famotidine  20 mg Oral Daily  . pantoprazole  40 mg Oral Daily  . piperacillin-tazobactam (ZOSYN)  IV  3.375 g Intravenous Q8H  . polyethylene glycol  17 g Oral QODAY   PRN:  acetaminophen, bisacodyl, polyvinyl alcohol  Diet:  Dysphagia 3 nectar thick liquids Activity:   Bathroom privileges with assistance DVT Prophylaxis:  INR therapeutic from coumadin prior to admission, remains therapeutic today  CLINICALLY SIGNIFICANT STUDIES Basic Metabolic Panel:  Recent Labs Lab 02/02/14 1910 02/04/14 0534  NA 137 142  K 4.3 4.2  CL 100 107  CO2 22 25  GLUCOSE 135* 90  BUN 29* 32*  CREATININE 1.22 1.31  CALCIUM 8.4 8.1*   Liver Function Tests:  Recent Labs Lab 02/01/14 02/04/14 0534   AST 11* 143*  ALT 8* 194*  ALKPHOS 96 234*  BILITOT  --  1.5*  PROT  --  5.7*  ALBUMIN  --  2.5*   CBC:  Recent Labs Lab 02/02/14 1910 02/04/14 0534  WBC 11.7* 12.6*  NEUTROABS 11.3*  --   HGB 11.1* 10.1*  HCT 34.4* 32.0*  MCV 90.5 92.0  PLT 226 194   Coagulation:  Recent Labs Lab 02/01/14 02/02/14 2210 02/03/14 0630 02/04/14 0534  LABPROT  --  22.3* 24.5* 23.9*  INR 1.4* 2.03* 2.29* 2.22*   Cardiac Enzymes:  Recent Labs Lab 02/02/14 2210  TROPONINI <0.30   Urinalysis:  Recent Labs Lab 02/02/14 1941  COLORURINE AMBER*  LABSPEC 1.021  PHURINE 7.0  GLUCOSEU NEGATIVE  HGBUR NEGATIVE  BILIRUBINUR SMALL*  KETONESUR NEGATIVE  PROTEINUR NEGATIVE  UROBILINOGEN 4.0*  NITRITE NEGATIVE  LEUKOCYTESUR NEGATIVE   Lipid Panel    Component Value Date/Time   CHOL 129 02/03/2014 0430   TRIG 99 02/03/2014 0430   HDL 45 02/03/2014 0430   CHOLHDL 2.9 02/03/2014 0430   VLDL 20 02/03/2014 0430   LDLCALC 64 02/03/2014 0430   HgbA1C  Lab Results  Component Value Date   HGBA1C 5.6 02/03/2014    Urine Drug Screen:  No results found for this basename: labopia, cocainscrnur, labbenz, amphetmu, thcu, labbarb    Alcohol Level: No results found for this basename: ETH,  in the last 168 hours   CT of the brain  02/03/2014   1. No acute intracranial pathology seen on CT. 2. Moderate cortical volume loss and scattered small vessel ischemic microangiopathy. Apparent chronic lacunar infarct at the pons. 3. Marked cerebellar atrophy or hypoplasia noted; underlying arachnoid cyst again may be present.   MRI, MRA of the brain  02/04/2014   1. Small acute left lateral medullary and left anterior frontal lobe (pre motor/Broca's area) infarcts. No associated mass effect. Petechial hemorrhage versus chronic micro hemorrhage in the left frontal lobe lesion, but no malignant hemorrhagic transformation. 2. Decreased flow or occlusion of the distal left vertebral artery corresponding to the lateral  medullary infarct. Left MCA M1 and branch irregularity with no focal stenosis or major branch occlusion. 3. Chronic small vessel disease in the brainstem. Symmetric appearing decreased bilateral PCA flow on the MRA, favor artifact. 4. Chronic posterior fossa extra-axial CSF (chronic arachnoid cyst versus mega cisterna magna variant). 5. Chronic signal changes in the left globe, on MRI resembles a dislocated the lens, but on comparison CTs more resembles sequelae of chronic retinal or choroid hemorrhage.   2D Echocardiogram   - Left ventricle: Wall thickness was increased in a pattern of mild LVH. Systolic function was normal. The estimated ejection fraction was in the range of 55% to 60%. - Aortic valve: There was mild stenosis.  - Left atrium: The atrium was moderately dilated. - Atrial septum: No defect or patent foramen ovale was identified.  Carotid Doppler  No evidence of hemodynamically significant internal carotid artery stenosis. Vertebral artery flow is antegrade.   CXR  02/02/2014    No acute cardiopulmonary disease.   EKG  normal sinus rhythm. For complete results please see formal report.   Therapy Recommendations SNF  Physical Exam    elderly male not in distress.Awake alert. Afebrile. Head is nontraumatic. Neck is supple without bruit.  . Cardiac exam no murmur or gallop. Lungs are clear to auscultation. Distal pulses are well felt. Neurologic Examination:  Mental Status:  Alert, oriented, thought content appropriate. Speech fluent without evidence of aphasia. Able to follow simple commands without difficulty.  Cranial Nerves:  II: Discs flat bilaterally; Blind in the left eye, Left pupil clouded, right pupil round, reactive to light  III,IV, VI: ptosis not present, extra-ocular motions intact bilaterally  V,VII: right facial droop, facial light touch sensation normal bilaterally  VIII: hearing decreased  IX,X: gag reflex reduced  XI: shoulder shrug decreased on the right   XII: midline tongue extension  Motor:  Right : Upper extremity 0/5 Left: Upper extremity 5/5  Lower extremity 0/5 Lower extremity 5-/5  Increased tone on the right  Sensory: Pinprick and light touch intact throughout, bilaterally  Deep Tendon Reflexes: Increased on the right  Plantars:  Right: upgoing Left: upgoing  Cerebellar:  Finger to nose testing with dysmetria on the left  Gait: Unable to test due to weakness  CV: pulses palpable throughout     ASSESSMENT [name] is a 80 y.o. male presenting with left-sided weakness.  Imaging confirms a left lateral medullary and left anterior frontal lobe infarcts infarct. Infarcts felt to be embolic secondary to known atrial fibrillation.  On warfarin prior to admission. Now on aspirin 81 mg orally every day for secondary stroke prevention. INR therapeutic. Patient with resultant left  hemiparesis. Work up underway.   Bacteremia, blood cultures positive GNR x 2, placed on zosyn, Pt is afebrile and WBC is 11.7  atrial fibrillation, rate controlled hypertension Hx stroke 1999  Hx Hyperlipidemia, LDL 64, on no statin PTA, now on no statin, at goal LDL < 100   Blind left eye  Peripheral vascular disease  Obstructive sleep apnea uses CPAP  Hospital day # 2  TREATMENT/PLAN  Consider use of Eliquis in this patient for secondary stroke prevention - pharmacy can dose. For now, continue  aspirin 81 mg orally every day for secondary stroke prevention given ongoing INR elevation 2.2.   SNF, pt from [facility] with plans to return there  Burnetta Sabin, MSN, RN, ANVP-BC, ANP-BC, [facility] Pager: [phone] 02/04/2014 12:07 PM  I have personally obtained a history, examined the patient, evaluated imaging results, and formulated the assessment and plan of care. I agree with the above. Antony Contras, MD

## 2014-02-04 NOTE — Evaluation (Signed)
Occupational Therapy Evaluation Patient Details Name: [name] MRN: [id number] DOB: 1919/02/06 Today's Date: 02/04/2014 Time: 9983-3825 OT Time Calculation (min): 55 min  OT Assessment / Plan / Recommendation History of present illness presenting with acute onset of weakness on the left.  Patient has a baseline right hemiplegia secondary to a previous stroke.  Head CT was negative, full workup in progress   Clinical Impression   Pt is motivated but limited currently by L side weakness. Currently he requires mod assist for pivot transfer to [facility]. He will benefit from skilled OT services to improve ADL independence for next venue of care.     OT Assessment  Patient needs continued OT Services    Follow Up Recommendations  SNF;Supervision/Assistance - 24 hour    Barriers to Discharge      Equipment Recommendations  None recommended by OT    Recommendations for Other Services    Frequency  Min 2X/week    Precautions / Restrictions Precautions Precautions: Fall Restrictions Weight Bearing Restrictions: No   Pertinent Vitals/Pain No complaint of    ADL  Eating/Feeding: Simulated;Set up Where Assessed - Eating/Feeding: Bed level Grooming: Performed;Wash/dry face;Min guard Where Assessed - Grooming: Unsupported sitting Upper Body Bathing: Simulated;Chest;Right arm;Left arm;Abdomen;Minimal assistance Where Assessed - Upper Body Bathing: Unsupported sitting Lower Body Bathing: Simulated;Moderate assistance Where Assessed - Lower Body Bathing: Supported sit to stand Upper Body Dressing: Simulated;Minimal assistance Where Assessed - Upper Body Dressing: Unsupported sitting Lower Body Dressing: Simulated;Moderate/max assistance Where Assessed - Lower Body Dressing: Supported sit to stand Toilet Transfer: Performed;Moderate assistance (increased time, assist for balance and faciltation of weight shift) Toilet Transfer Method: Stand pivot Toilet Transfer Equipment: Bedside  commode Toileting - Clothing Manipulation and Hygiene: Simulated;Maximal assistance Where Assessed - Toileting Clothing Manipulation and Hygiene: Sit to stand from 3-in-1 or toilet ADL Comments: Pt able to don socks at EOB with increased time using R hand to help hold sock but mainly using L to don sock. Pt needs mod assist to stand and support self in standing and therefore more assist to manage clothing as he is requiring L UE to hold onto a surface to support himself in standing.     OT Diagnosis: Generalized weakness  OT Problem List: Decreased strength;Impaired UE functional use;Decreased knowledge of use of DME or AE OT Treatment Interventions: Self-care/ADL training;DME and/or AE instruction;Therapeutic activities;Patient/family education   OT Goals(Current goals can be found in the care plan section) Acute Rehab OT Goals Patient Stated Goal: be able to do what I was OT Goal Formulation: With patient Time For Goal Achievement: 02/18/14 Potential to Achieve Goals: Good  Visit Information  Last OT Received On: 02/04/14 Assistance Needed: +1 History of Present Illness: presenting with acute onset of weakness on the left.  Patient has a baseline right hemiplegia secondary to a previous stroke.  Head CT was negative, full workup in progress       Prior [place] expects to be discharged to:: [place]: Wheelchair - manual Prior Function Level of Independence: Needs assistance Gait / Transfers Assistance Needed: reports he occasionally needs assistance with transfers to wheelchair, does not usually ambulate  ADL's / Padroni Needed: pt reports he has assist for shower 2X per week, does own sponge bath and dress the other days and takes himself into bathroom with wheelchair to transfer on toilet. Only occassional assist with clothing management at toilet.  Communication Communication: HOH Dominant Hand:  Left  Vision/Perception Vision - History Baseline Vision: Other (comment) (blind L eye)   Cognition  Cognition Arousal/Alertness: Awake/alert Behavior During Therapy: WFL for tasks assessed/performed Overall Cognitive Status: Within Functional Limits for tasks assessed    Extremity/Trunk Assessment Upper Extremity Assessment Upper Extremity Assessment: RUE deficits/detail;LUE deficits/detail RUE Deficits / Details: residual weakness from prior stroke, uses R hand to help hold sock to don with L but limited overall functiona with R UE.  LUE Deficits / Details: ROM WFL; overall grossly 4/5 strength; dysmetria noted LUE Coordination: decreased fine motor;decreased gross motor     Mobility Bed Mobility Overal bed mobility: Needs Assistance Bed Mobility: Supine to Sit;Sit to Supine Supine to sit: HOB elevated;Mod assist Sit to supine: Mod assist General bed mobility comments: assist to bring trunk to upright and scoot to EOB. Assist to scoot up toward [facility] but able to bring LEs up onto bed.  Transfers Overall transfer level: Needs assistance Equipment used: None Transfers: Sit to/from Stand Sit to Stand: Mod assist Stand pivot transfers: Mod assist General transfer comment: pivot to [facility] and back to bed. increased time and several attempts.     Exercise     Balance Balance Sitting balance-Leahy Scale: Good   End of Session OT - End of Session Activity Tolerance: Patient tolerated treatment well Patient left: in bed;with call bell/phone within reach;with bed alarm set  GO     Jules Schick [phone] 02/04/2014, 12:47 PM

## 2014-02-04 NOTE — Progress Notes (Signed)
Chart reviewed.   [facility] PROGRESS NOTE   BEACHER EVERY SAY:[id number] DOB: [birth year] DOA: 02/02/2014 PCP: Christinia Gully, MD   Assessment/Plan:  Acute embolic CVA Neuro following -Patient is on aspirin and Coumadin, was recently on Lovenox for a subtherapeutic INR on 2/27. -Dopplers, echo  Dysphagia: modified diet  Atrial fibrillation -Currently rate controlled, with Toprol-XL. -On Coumadin for secondary stroke prevention,   GNR sepsis: cx pending. Continue zosyn. UA negative. Culture pending. CXR negative.  Dehydration imrpoved  Code Status: DO NOT RESUSCITATE Family Communication: Plan discussed with the patient. Disposition Plan: Remains inpatient   Consultants:  Neuro  Procedures:  None  Antibiotics:  None  Subjective: no complaints  Objective: Filed Vitals:   02/04/14 1139  BP: 140/68  Pulse: 54  Temp: 97.9 F (36.6 C)  Resp: 18    Intake/Output Summary (Last 24 hours) at 02/04/14 1749 Last data filed at 02/04/14 1300  Gross per 24 hour  Intake    240 ml  Output      0 ml  Net    240 ml   Filed Weights   02/03/14 0159  Weight: 71.8 kg (158 lb 4.6 oz)    Exam: General: Alert and awake, oriented x3, talkative HEENT: left cornea cloudy with erythema and tearing CVS: S1-S2 clear, no murmur rubs or gallops Chest: clear to auscultation bilaterally, no wheezing, rales or rhonchi Abdomen: soft nontender, nondistended, normal bowel sounds, no organomegaly Extremities: no cyanosis, clubbing or edema noted bilaterally Neuro: left sided weakness  Data Reviewed: Basic Metabolic Panel:  Recent Labs Lab 02/01/14 02/02/14 1910 02/04/14 0534  NA 139 137 142  K 4.5 4.3 4.2  CL  --  100 107  CO2  --  22 25  GLUCOSE  --  135* 90  BUN 21 29* 32*  CREATININE 1.1 1.22 1.31  CALCIUM  --  8.4 8.1*   Liver Function Tests:  Recent Labs Lab 02/01/14 02/04/14 0534  AST 11* 143*  ALT 8* 194*  ALKPHOS 96 234*  BILITOT  --  1.5*   PROT  --  5.7*  ALBUMIN  --  2.5*   No results found for this basename: LIPASE, AMYLASE,  in the last 168 hours No results found for this basename: AMMONIA,  in the last 168 hours CBC:  Recent Labs Lab 02/01/14 02/02/14 1910 02/04/14 0534  WBC 6.1 11.7* 12.6*  NEUTROABS  --  11.3*  --   HGB 10.8* 11.1* 10.1*  HCT 33* 34.4* 32.0*  MCV  --  90.5 92.0  PLT 282 226 194   Cardiac Enzymes:  Recent Labs Lab 02/02/14 2210  TROPONINI <0.30   BNP (last 3 results) No results found for this basename: PROBNP,  in the last 8760 hours CBG:  Recent Labs Lab 02/03/14 1128 02/03/14 2134 02/04/14 0753 02/04/14 1138 02/04/14 1702  GLUCAP 87 122* 91 109* 96    Micro Recent Results (from the past 240 hour(s))  CULTURE, BLOOD (ROUTINE X 2)     Status: None   Collection Time    02/02/14  7:10 PM      Result Value Ref Range Status   Specimen Description BLOOD RIGHT HAND   Final   Special Requests BOTTLES DRAWN AEROBIC AND ANAEROBIC 4CC EACH   Final   Culture  Setup Time     Final   Value: 02/03/2014 02:58     Performed at [facility]   Culture     Final   Value: Lonell Grandchild  NEGATIVE RODS     Note: Gram Stain Report Called to,Read Back By and Verified With: A. TEPE 02/03/14 @ 12:20PM BY RUSCOE A.     Performed at [facility]   Report Status PENDING   Incomplete  CULTURE, BLOOD (ROUTINE X 2)     Status: None   Collection Time    02/02/14  7:30 PM      Result Value Ref Range Status   Specimen Description BLOOD RIGHT HAND   Final   Special Requests BOTTLES DRAWN AEROBIC AND ANAEROBIC 4CC   Final   Culture  Setup Time     Final   Value: 02/03/2014 02:58     Performed at [facility]   Culture     Final   Value: GRAM NEGATIVE RODS     Note: Gram Stain Report Called to,Read Back By and Verified With: A. TEPE 02/03/14 @ 12:20PM BY RUSCOE A.     Performed at [facility]   Report Status PENDING   Incomplete  MRSA PCR SCREENING     Status: None   Collection  Time    02/03/14  7:46 AM      Result Value Ref Range Status   MRSA by PCR NEGATIVE  NEGATIVE Final   Comment:            The GeneXpert MRSA Assay (FDA     approved for NASAL specimens     only), is one component of a     comprehensive MRSA colonization     surveillance program. It is not     intended to diagnose MRSA     infection nor to guide or     monitor treatment for     MRSA infections.     Studies: Ct Head Wo Contrast  02/03/2014   CLINICAL DATA:  Fever, nausea and vomiting. Decreased O2 saturation.  EXAM: CT HEAD WITHOUT CONTRAST  TECHNIQUE: Contiguous axial images were obtained from the base of the skull through the vertex without intravenous contrast.  COMPARISON:  CT of the head performed 05/29/2013  FINDINGS: There is no evidence of acute infarction, mass lesion, or intra- or extra-axial hemorrhage on CT.  Prominence of the ventricles and sulci reflects moderate cortical volume loss. There is marked cerebellar atrophy or hypoplasia; an underlying arachnoid cyst again may be present. Scattered periventricular and subcortical white matter change likely reflects small vessel ischemic microangiopathy. An apparent chronic lacunar infarct is seen at the pons.  The fourth ventricle are within normal limits. The basal ganglia are unremarkable in appearance. The cerebral hemispheres demonstrate grossly normal gray-white differentiation. No mass effect or midline shift is seen.  There is no evidence of fracture; visualized osseous structures are unremarkable in appearance. The visualized portions of the orbits are within normal limits. The paranasal sinuses and mastoid air cells are well-aerated. No significant soft tissue abnormalities are seen.  IMPRESSION: 1. No acute intracranial pathology seen on CT. 2. Moderate cortical volume loss and scattered small vessel ischemic microangiopathy. Apparent chronic lacunar infarct at the pons. 3. Marked cerebellar atrophy or hypoplasia noted; underlying  arachnoid cyst again may be present.   Electronically Signed   By: Garald Balding M.D.   On: 02/03/2014 00:01   Mri Brain Without Contrast  02/04/2014   CLINICAL DATA:  80 year old male with left side weakness, new trouble with cord may shin. Initial encounter.  EXAM: MRI HEAD WITHOUT CONTRAST  MRA HEAD WITHOUT CONTRAST  TECHNIQUE: Multiplanar, multiecho pulse sequences  of the brain and surrounding structures were obtained without intravenous contrast. Angiographic images of the head were obtained using MRA technique without contrast.  COMPARISON:  Head CTs 02/02/2014 and earlier.  FINDINGS: MRI HEAD FINDINGS  Chronic prominent posterior fossa extra-axial CSF, unchanged over the series of exams. Cerebral volume has not significantly changed since 2012.  Major intracranial vascular flow voids are preserved. However, there are 2 small areas of acute restricted diffusion identified. Small cortical and white matter area of restricted diffusion in the anterior left frontal lobe (pre motor and near the Broca's area). Also there is a small focus of restricted diffusion in the left lateral medulla oblong cauda (series 4, image 6).  Corresponding mild T2 and FLAIR hyperintensity at both of these areas with no associated mass effect and no definite acute hemorrhage. There are superimposed chronic micro hemorrhages, including in the area of left frontal lobe restricted diffusion (series 10, image 88).  No other restricted diffusion. Chronic lacunar infarcts in the pons. Prominent perivascular spaces in the basal ganglia. Patchy and confluent bilateral cerebral white matter nonspecific T2 and FLAIR hyperintensity.  No ventriculomegaly. No intracranial mass effect. Negative pituitary and upper cervical spine. Normal bone marrow signal. Mastoids are clear. Visible internal auditory structures appear normal. Minor paranasal sinus mucosal thickening. Visualized scalp soft tissues are within normal limits.  Degenerative changes at  the left TMJ with joint effusion. Postoperative changes to the right globe. Dependent area of decreased signal in the left petrous, corresponding to chronic increased density on CT.  MRA HEAD FINDINGS  Normal antegrade flow signal in the distal right vertebral artery which appears mildly dominant. There is some antegrade flow in the visible distal left V2 segment, but no antegrade flow in the left V3 or proximal V4 segment. There is a small segment of preserved flow signal just proximal to the left vertebrobasilar junction, and this includes the level of the left PICA origin which seems to remain patent.  The distal right vertebral artery primarily supplies the basilar. There is basilar artery irregularity but no hemodynamically significant stenosis. AICA origins are patent. SCA origins are patent. Fetal type bilateral PCA origins.  Apparent decreased flow signal in both PCA branches, but appears symmetric.  Antegrade flow in both ICA siphons. Mild ICA tortuosity and irregularity compatible with atherosclerosis. No ICA stenosis identified. Patent carotid termini. Ophthalmic and posterior communicating artery origins are within normal limits.  Patent MCA and ACA origins. Dominant right ACA A1 segment. Fenestrated but otherwise negative anterior communicating artery. Negative visualized ACA branches.  Negative visualized right MCA branches except for mild irregularity. Increased irregularity at the left MCA origin and M1 segment. Slightly decreased intensity of flow signal in the left MCA branches compared to the right. No major left MCA branch occlusion identified.  IMPRESSION: 1. Small acute left lateral medullary and left anterior frontal lobe (pre motor/Broca's area) infarcts. No associated mass effect. Petechial hemorrhage versus chronic micro hemorrhage in the left frontal lobe lesion, but no malignant hemorrhagic transformation. 2. Decreased flow or occlusion of the distal left vertebral artery corresponding to  the lateral medullary infarct. Left MCA M1 and branch irregularity with no focal stenosis or major branch occlusion. 3. Chronic small vessel disease in the brainstem. Symmetric appearing decreased bilateral PCA flow on the MRA, favor artifact. 4. Chronic posterior fossa extra-axial CSF (chronic arachnoid cyst versus mega cisterna magna variant). 5. Chronic signal changes in the left globe, on MRI resembles a dislocated the lens, but on comparison CTs more  resembles sequelae of chronic retinal or choroid hemorrhage. Salient findings discussed by telephone with Dr. Remus Blake On 02/04/2014 at 08:40 .   Electronically Signed   By: Lars Pinks M.D.   On: 02/04/2014 08:37   Dg Chest Port 1 View  02/02/2014   CLINICAL DATA:  Short of breath.  EXAM: PORTABLE CHEST - 1 VIEW  COMPARISON:  08/09/2013  FINDINGS: Cardiac silhouette is normal in size. Aorta is uncoiled. No mediastinal or hilar masses. No acute findings in the lungs. No pleural effusion or pneumothorax. The bony thorax is diffusely demineralized but grossly intact. No change from the prior study.  IMPRESSION: No acute cardiopulmonary disease.   Electronically Signed   By: Lajean Manes M.D.   On: 02/02/2014 18:49   Dg Swallowing Func-speech Pathology  02/04/2014   Orbie Pyo Hudson, CCC-SLP     02/04/2014 11:38 AM Objective Swallowing Evaluation: Modified Barium Swallowing Study   Patient Details  Name: [name] MRN: [id number] Date of Birth: 01/12/1919  Today's Date: 02/04/2014 Time: 1040-1100 SLP Time Calculation (min): 20 min  Past Medical History:  Past Medical History  Diagnosis Date  . GERD (gastroesophageal reflux disease)   . Hypertension   . Hearing loss     wears hearing aid - right side  . Leg swelling   . Trouble swallowing   . Bruises easily     due to coumadin  . Weakness     difficulty walking  . Contact lens/glasses fitting   . Stroke 1999  . Cancer   . 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 Gait disturbance   . Dyslipidemia   . COPD (chronic obstructive  pulmonary disease)   . Peripheral vascular disease   . History of melanoma   . Diverticulosis   . Abnormality of gait 05/16/2013  . Choledocholithiasis with obstruction 06/28/13  . Embolic stroke   . PAF (paroxysmal atrial fibrillation)   . Hyperlipidemia   . Bradycardia   . Protein-calorie malnutrition, severe   . Ventricular tachycardia (paroxysmal)   . Obstructive sleep apnea of adult     uses cpap, pt does not know setting  . Personal history of fall 05/29/2013  . Fracture of femoral neck, right, closed 3114  . Long term (current) use of anticoagulants     PAF and embolic CVA  . Anemia, unspecified 07/05/2013  . Xerophthalmia 07/05/2013  . 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 Actinic keratosis   . Seborrheic keratosis    Past Surgical History:  Past Surgical History  Procedure Laterality Date  . Excision of melanoma  2009    left leg  . Melanoma excision      many melanoma removed in past  . Melanoma excision  10/11/2011    Procedure: MELANOMA EXCISION;  Surgeon: Pedro Earls, MD;   Location: [place];  Service: General;  Laterality: Left;  EXCISION  melanoma left leg with full thickness skin grafting from left  lower abdomen.  . Joint replacement  2012    r fem head fx  . Lung benign area removed   1970's  . Hernia repair  1983  . Melanoma excision  05/16/2012    Procedure: MELANOMA EXCISION;  Surgeon: Pedro Earls, MD;   Location: [facility];  Service: General;  Laterality: Left;  Nodule  Removal of Melanoma on Left Shin  . Ercp N/A 07/04/2013    Procedure: ENDOSCOPIC RETROGRADE CHOLANGIOPANCREATOGRAPHY  (ERCP);  Surgeon: Irene Shipper, MD;  Location: [facility];  Service: Endoscopy;  Laterality: N/A;   HPI:  This 80 year old male presented with nausea, vomiting, hypoxia.  He was noted to have left-sided weakness consistent with TIA/CVA  was felt to require transfer to [facility] for further  stroke workup and neurology consult. Patient does have a history  of atrial fib chronically on Coumadin.  Information taken  from  Nursing [facility] indicates patient on mechanical soft consistency with  honey thick liquids but may have thin coffee, thin water in room,  and thin milk with cereal prior to current admission.  Nursing  contacted at [facility] and report mechanical soft diet /honey thick  liquids.  Son contacted via phone and was not aware of his father  on thickened liquids and that "he failed every swallow test he  ever had".   Most current MBS completed at [facility] 2006.  No speech  noted available from SNF.       Assessment / Plan / Recommendation Clinical Impression  Dysphagia Diagnosis: Mild oral phase dysphagia;Moderate  pharyngeal phase dysphagia;Severe pharyngeal phase dysphagia Clinical impression: Pt. demonstrated mild oral and  moderate-severe sensorimotor based pharyngeal dysphagia.   Decreased pharyngeal sensation, reduced laryngeal elevation,  tongue base retraction resulted in silent and frank aspiration of  thin and nectar thick (large sip).  Therapeutic intervention with  chin tuck technique ineffictive and aided in aspiration events.   Vallecular and pyriform residue present (mild).  Pt. appears to  be a chronic aspirator per history and risk remains despite  diet/liquid texture somewhat due to behavioral and cognitive  impact (decreased overall awareness, increased sip size,  decreased sensation of residue).  Recommend texture upgrade to  Dys 3 and continue nectar thick liquids, SMALL sips, no straws,  pills whole applesauce.  May need to discuss liquid downgrade  with pt. versus comfort/quality of life with thin liquids if  difficulty following precautions.      Treatment Recommendation  Therapy as outlined in treatment plan below    Diet Recommendation Dysphagia 3 (Mechanical Soft);Nectar-thick  liquid   Liquid Administration via: Cup;No straw Medication Administration: Whole meds with puree Supervision: Patient able to self feed;Full supervision/cueing  for compensatory strategies Compensations: Slow rate;Small  sips/bites;Multiple dry swallows  after each bite/sip;Clear throat intermittently;Check for  pocketing Postural Changes and/or Swallow Maneuvers: Seated upright 90  degrees;Upright 30-60 min after meal    Other  Recommendations Oral Care Recommendations: Oral care BID   Follow Up Recommendations  Skilled Nursing facility    Frequency and Duration min 2x/week  2 weeks   Pertinent Vitals/Pain WDL            Reason for Referral Objectively evaluate swallowing function   Oral Phase Oral Preparation/Oral Phase Oral Phase: Impaired Oral - Nectar Oral - Nectar Cup: Right pocketing in lateral sulci Oral - Thin Oral - Thin Cup: Right pocketing in lateral sulci   Pharyngeal Phase Pharyngeal Phase Pharyngeal Phase: Impaired Pharyngeal - Nectar Pharyngeal - Nectar Teaspoon: Delayed swallow  initiation;Premature spillage to valleculae;Pharyngeal residue -  valleculae;Pharyngeal residue - pyriform sinuses;Reduced tongue  base retraction;Reduced laryngeal elevation Pharyngeal - Nectar Cup: Delayed swallow initiation;Pharyngeal  residue - valleculae;Pharyngeal residue - pyriform  sinuses;Reduced tongue base retraction;Reduced laryngeal  elevation;Penetration/Aspiration during swallow;Premature  spillage to pyriform sinuses;Reduced anterior laryngeal  mobility;Reduced airway/laryngeal closure;Significant aspiration  (Amount) (large sip) Penetration/Aspiration details (nectar cup): Material enters  airway, passes BELOW cords without attempt by patient to eject  out (silent aspiration) Pharyngeal - Thin Pharyngeal - Thin Teaspoon: Delayed swallow initiation;Premature  spillage to pyriform sinuses;Premature spillage to  valleculae;Penetration/Aspiration during swallow;Reduced tongue  base retraction;Reduced airway/laryngeal closure;Reduced  laryngeal elevation;Pharyngeal residue - valleculae;Pharyngeal  residue - pyriform sinuses;Moderate aspiration Penetration/Aspiration details (thin teaspoon): Material enters  airway, passes BELOW  cords without attempt by patient to eject  out (silent aspiration) Pharyngeal - Thin Cup: Penetration/Aspiration during  swallow;Reduced laryngeal elevation;Reduced airway/laryngeal  closure;Pharyngeal residue - pyriform sinuses;Pharyngeal residue  - valleculae;Delayed swallow initiation;Premature spillage to  valleculae (aspiration during chin tuck ) Penetration/Aspiration details (thin cup): Material enters  airway, passes BELOW cords without attempt by patient to eject  out (silent aspiration) Pharyngeal - Solids Pharyngeal - Regular: Within functional limits  Cervical Esophageal Phase    GO    Cervical Esophageal Phase Cervical Esophageal Phase:  (? tight UES)         Cranford Mon.Ed CCC-SLP Pager [phone]  02/04/2014   Mr Jodene Nam Head/brain Wo Cm  02/04/2014   CLINICAL DATA:  80 year old male with left side weakness, new trouble with cord may shin. Initial encounter.  EXAM: MRI HEAD WITHOUT CONTRAST  MRA HEAD WITHOUT CONTRAST  TECHNIQUE: Multiplanar, multiecho pulse sequences of the brain and surrounding structures were obtained without intravenous contrast. Angiographic images of the head were obtained using MRA technique without contrast.  COMPARISON:  Head CTs 02/02/2014 and earlier.  FINDINGS: MRI HEAD FINDINGS  Chronic prominent posterior fossa extra-axial CSF, unchanged over the series of exams. Cerebral volume has not significantly changed since 2012.  Major intracranial vascular flow voids are preserved. However, there are 2 small areas of acute restricted diffusion identified. Small cortical and white matter area of restricted diffusion in the anterior left frontal lobe (pre motor and near the Broca's area). Also there is a small focus of restricted diffusion in the left lateral medulla oblong cauda (series 4, image 6).  Corresponding mild T2 and FLAIR hyperintensity at both of these areas with no associated mass effect and no definite acute hemorrhage. There are superimposed chronic micro  hemorrhages, including in the area of left frontal lobe restricted diffusion (series 10, image 88).  No other restricted diffusion. Chronic lacunar infarcts in the pons. Prominent perivascular spaces in the basal ganglia. Patchy and confluent bilateral cerebral white matter nonspecific T2 and FLAIR hyperintensity.  No ventriculomegaly. No intracranial mass effect. Negative pituitary and upper cervical spine. Normal bone marrow signal. Mastoids are clear. Visible internal auditory structures appear normal. Minor paranasal sinus mucosal thickening. Visualized scalp soft tissues are within normal limits.  Degenerative changes at the left TMJ with joint effusion. Postoperative changes to the right globe. Dependent area of decreased signal in the left petrous, corresponding to chronic increased density on CT.  MRA HEAD FINDINGS  Normal antegrade flow signal in the distal right vertebral artery which appears mildly dominant. There is some antegrade flow in the visible distal left V2 segment, but no antegrade flow in the left V3 or proximal V4 segment. There is a small segment of preserved flow signal just proximal to the left vertebrobasilar junction, and this includes the level of the left PICA origin which seems to remain patent.  The distal right vertebral artery primarily supplies the basilar. There is basilar artery irregularity but no hemodynamically significant stenosis. AICA origins are patent. SCA origins are patent. Fetal type bilateral PCA origins.  Apparent decreased flow signal in both PCA branches, but appears symmetric.  Antegrade flow in both ICA siphons. Mild ICA tortuosity and irregularity compatible with atherosclerosis. No ICA stenosis identified. Patent carotid termini. Ophthalmic and  posterior communicating artery origins are within normal limits.  Patent MCA and ACA origins. Dominant right ACA A1 segment. Fenestrated but otherwise negative anterior communicating artery. Negative visualized ACA  branches.  Negative visualized right MCA branches except for mild irregularity. Increased irregularity at the left MCA origin and M1 segment. Slightly decreased intensity of flow signal in the left MCA branches compared to the right. No major left MCA branch occlusion identified.  IMPRESSION: 1. Small acute left lateral medullary and left anterior frontal lobe (pre motor/Broca's area) infarcts. No associated mass effect. Petechial hemorrhage versus chronic micro hemorrhage in the left frontal lobe lesion, but no malignant hemorrhagic transformation. 2. Decreased flow or occlusion of the distal left vertebral artery corresponding to the lateral medullary infarct. Left MCA M1 and branch irregularity with no focal stenosis or major branch occlusion. 3. Chronic small vessel disease in the brainstem. Symmetric appearing decreased bilateral PCA flow on the MRA, favor artifact. 4. Chronic posterior fossa extra-axial CSF (chronic arachnoid cyst versus mega cisterna magna variant). 5. Chronic signal changes in the left globe, on MRI resembles a dislocated the lens, but on comparison CTs more resembles sequelae of chronic retinal or choroid hemorrhage. Salient findings discussed by telephone with Dr. Remus Blake On 02/04/2014 at 08:40 .   Electronically Signed   By: Lars Pinks M.D.   On: 02/04/2014 08:37    Scheduled Meds: . aspirin  81 mg Oral Daily  . famotidine  20 mg Oral Daily  . feeding supplement (ENSURE COMPLETE)  237 mL Oral Q24H  . pantoprazole  40 mg Oral Daily  . piperacillin-tazobactam (ZOSYN)  IV  3.375 g Intravenous Q8H  . polyethylene glycol  17 g Oral QODAY   Continuous Infusions:    Time spent: 35 minutes  [facility] Pager [phone] If 7PM-7AM, please contact night-coverage at [email or web address], password [facility] 02/04/2014, 5:49 PM  LOS: 2 days

## 2014-02-04 NOTE — Clinical Social Work Note (Addendum)
CSW attempted to speak to pt at bedside. Pt was not at bedside. Per SPT, pt is down for swallow evaluation. Per SPT, pt is from [facility]. CSW to contact admissions at [facility] and re-attempt to speak with pt at bedside once he returns to unit.  CSW attempted to speak with pt at bedside. Pt was asleep and did not respond to CSW speaking pt's name. CSW will reassess in the morning.  Pati Gallo, [place] Social Worker [phone]

## 2014-02-04 NOTE — Progress Notes (Signed)
INITIAL NUTRITION ASSESSMENT  DOCUMENTATION CODES Per approved criteria  -Not Applicable   INTERVENTION: Provide Ensure Pudding once daily Provide Ensure Complete PRN RD to continue to monitor and provide further intervention as needed  NUTRITION DIAGNOSIS: Predicted suboptimal energy/nutrient intake related to current medical condition as evidenced by pt with nausea and vomiting per chart.   Goal: Pt to meet >/= 90% of their estimated nutrition needs   Monitor:  PO intake, weight trends, labs  Reason for Assessment: Malnutrition Screening Tool, score of 3  80 y.o. male  Admitting Dx: <principal problem not specified>  ASSESSMENT: 80 year old male with history of hypertension, GERD, COPD, dysphagia, diverticulosis, and choledocholithiasis with obstruction presented with nausea, vomiting, and hypoxia. He was noted to have left-sided weakness consistent with TIA/CVA was felt to require transfer to [facility] for further stroke workup and neurology consult.  RD attempted to meet with pt but, pt working with PT at time of first visit and asleep upon second visit. Pt has history of weight loss and malnutrition but, per weight history, it appears that pt's weight has been stable since July. Per chart, pt was declining PO intake PTA. Per nursing notes pt ate 80% of breakfast today, 0% of lunch due to pt having visitors.   Height: Ht Readings from Last 1 Encounters:  02/03/14 5\' 2"  (1.575 m)    Weight: Wt Readings from Last 1 Encounters:  02/03/14 158 lb 4.6 oz (71.8 kg)    Ideal Body Weight: 118 lbs  % Ideal Body Weight: 134%  Wt Readings from Last 10 Encounters:  02/03/14 158 lb 4.6 oz (71.8 kg)  09/19/13 149 lb (67.586 kg)  08/10/13 150 lb 12.7 oz (68.4 kg)  07/24/13 149 lb 12.8 oz (67.949 kg)  06/29/13 149 lb 0.5 oz (67.6 kg)  06/29/13 149 lb 0.5 oz (67.6 kg)  05/16/13 160 lb (72.576 kg)  01/31/13 168 lb (76.204 kg)  04/25/13 167 lb (75.751 kg)  12/25/12  166 lb (75.297 kg)    Usual Body Weight: 168 lbs (February 2014)  % Usual Body Weight: 94%  BMI:  Body mass index is 28.94 kg/(m^2).  Estimated Nutritional Needs: Kcal: 1600-1800 Protein: 80-90 grams Fluid: 1.8 L/day  Skin: intact  Diet Order: Dysphagia 3, nectar-thick  EDUCATION NEEDS: -No education needs identified at this time   Intake/Output Summary (Last 24 hours) at 02/04/14 1448 Last data filed at 02/04/14 1300  Gross per 24 hour  Intake    240 ml  Output      0 ml  Net    240 ml    Last BM: 2/28   Labs:   Recent Labs Lab 02/01/14 02/02/14 1910 02/04/14 0534  NA 139 137 142  K 4.5 4.3 4.2  CL  --  100 107  CO2  --  22 25  BUN 21 29* 32*  CREATININE 1.1 1.22 1.31  CALCIUM  --  8.4 8.1*  GLUCOSE  --  135* 90    CBG (last 3)   Recent Labs  02/03/14 2134 02/04/14 0753 02/04/14 1138  GLUCAP 122* 91 109*    Scheduled Meds: . aspirin  81 mg Oral Daily  . famotidine  20 mg Oral Daily  . pantoprazole  40 mg Oral Daily  . piperacillin-tazobactam (ZOSYN)  IV  3.375 g Intravenous Q8H  . polyethylene glycol  17 g Oral QODAY    Continuous Infusions:   Past Medical History  Diagnosis Date  . GERD (gastroesophageal reflux disease)   .  Hypertension   . Hearing loss     wears hearing aid - right side  . Leg swelling   . Trouble swallowing   . Bruises easily     due to coumadin  . Weakness     difficulty walking  . Contact lens/glasses fitting   . Stroke 1999  . Cancer   . 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 Gait disturbance   . Dyslipidemia   . COPD (chronic obstructive pulmonary disease)   . Peripheral vascular disease   . History of melanoma   . Diverticulosis   . Abnormality of gait 05/16/2013  . Choledocholithiasis with obstruction 06/28/13  . Embolic stroke   . PAF (paroxysmal atrial fibrillation)   . Hyperlipidemia   . Bradycardia   . Protein-calorie malnutrition, severe   . Ventricular tachycardia (paroxysmal)   . Obstructive sleep  apnea of adult     uses cpap, pt does not know setting  . Personal history of fall 05/29/2013  . Fracture of femoral neck, right, closed 3114  . Long term (current) use of anticoagulants     PAF and embolic CVA  . Anemia, unspecified 07/05/2013  . Xerophthalmia 07/05/2013  . 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 Actinic keratosis   . Seborrheic keratosis     Past Surgical History  Procedure Laterality Date  . Excision of melanoma  2009    left leg  . Melanoma excision      many melanoma removed in past  . Melanoma excision  10/11/2011    Procedure: MELANOMA EXCISION;  Surgeon: Pedro Earls, MD;  Location: [place];  Service: General;  Laterality: Left;  EXCISION melanoma left leg with full thickness skin grafting from left lower abdomen.  . Joint replacement  2012    r fem head fx  . Lung benign area removed   1970's  . Hernia repair  1983  . Melanoma excision  05/16/2012    Procedure: MELANOMA EXCISION;  Surgeon: Pedro Earls, MD;  Location: [facility];  Service: General;  Laterality: Left;  Nodule Removal of Melanoma on Left Shin  . Ercp N/A 07/04/2013    Procedure: ENDOSCOPIC RETROGRADE CHOLANGIOPANCREATOGRAPHY (ERCP);  Surgeon: Irene Shipper, MD;  Location: [facility];  Service: Endoscopy;  Laterality: N/A;    Pryor Ochoa RD, LDN Inpatient Clinical Dietitian Pager: [phone] After Hours Pager: [phone]

## 2014-02-04 NOTE — Progress Notes (Signed)
  Echocardiogram 2D Echocardiogram has been performed.  [place], [facility] 02/04/2014, 10:25 AM

## 2014-02-04 NOTE — Progress Notes (Signed)
Speech Language Pathology  Patient Details Name: [name] MRN: [id number] DOB: 18-Aug-1919 Today's Date: 02/04/2014 Time:  -      MBS scheduled today at 10:30     Orbie Pyo Colvin Caroli.Ed [employer] [phone]  02/04/2014

## 2014-02-05 DIAGNOSIS — R6889 Other general symptoms and signs: Secondary | ICD-10-CM

## 2014-02-05 LAB — GLUCOSE, CAPILLARY
GLUCOSE-CAPILLARY: 104 mg/dL — AB (ref 70–99)
GLUCOSE-CAPILLARY: 117 mg/dL — AB (ref 70–99)
Glucose-Capillary: 127 mg/dL — ABNORMAL HIGH (ref 70–99)
Glucose-Capillary: 126 mg/dL — ABNORMAL HIGH (ref 70–99)

## 2014-02-05 LAB — URINE CULTURE
COLONY COUNT: NO GROWTH
Culture: NO GROWTH

## 2014-02-05 LAB — CULTURE, BLOOD (ROUTINE X 2)

## 2014-02-05 LAB — PROTIME-INR
INR: 1.7 — AB (ref 0.00–1.49)
PROTHROMBIN TIME: 19.5 s — AB (ref 11.6–15.2)

## 2014-02-05 MED ORDER — CIPROFLOXACIN HCL 0.3 % OP SOLN
1.0000 [drp] | OPHTHALMIC | Status: DC
  Administered 2014-02-05 – 2014-02-06 (×9): 1 [drp] via OPHTHALMIC
  Filled 2014-02-05: qty 2.5

## 2014-02-05 MED ORDER — CIPROFLOXACIN IN D5W 400 MG/200ML IV SOLN
400.0000 mg | Freq: Two times a day (BID) | INTRAVENOUS | Status: DC
  Administered 2014-02-05 – 2014-02-06 (×3): 400 mg via INTRAVENOUS
  Filled 2014-02-05 (×4): qty 200

## 2014-02-05 MED ORDER — SYSTANE NIGHTTIME OP OINT: 1.0000 "application " | TOPICAL_OINTMENT | Freq: Every day | OPHTHALMIC | Status: DC

## 2014-02-05 MED ORDER — ARTIFICIAL TEARS OP OINT
TOPICAL_OINTMENT | Freq: Every day | OPHTHALMIC | Status: DC
  Administered 2014-02-05: 22:00:00 via OPHTHALMIC
  Filled 2014-02-05: qty 3.5

## 2014-02-05 MED ORDER — ENOXAPARIN SODIUM 40 MG/0.4ML ~~LOC~~ SOLN
40.0000 mg | SUBCUTANEOUS | Status: DC
  Administered 2014-02-05: 40 mg via SUBCUTANEOUS
  Filled 2014-02-05 (×2): qty 0.4

## 2014-02-05 NOTE — Progress Notes (Signed)
Physical Therapy Treatment Patient Details Name: [name] MRN: [id number] DOB: January 23, 1919 Today's Date: 02/05/2014 Time: 0932-6712 PT Time Calculation (min): 16 min  PT Assessment / Plan / Recommendation  History of Present Illness presenting with acute onset of weakness on the left.  Patient has a baseline right hemiplegia secondary to a previous stroke.  Head CT was negative, full workup in progress   PT Comments   Patient still requires increased assist today, also some deficits noted in sitting EOB. Will need ST SNF upon discharge until patient progresses to baseline for transfers. Will continue to see as indicated and progress as tolerated.   Follow Up Recommendations  SNF     Does the patient have the potential to tolerate intense rehabilitation     Barriers to Discharge        Equipment Recommendations  None recommended by PT    Recommendations for Other Services    Frequency Min 2X/week   Progress towards PT Goals    Plan Discharge plan needs to be updated    Precautions / Restrictions Precautions Precautions: Fall Restrictions Weight Bearing Restrictions: No   Pertinent Vitals/Pain VSS, NAD    Mobility  Bed Mobility Overal bed mobility: Needs Assistance Bed Mobility: Supine to Sit;Sit to Supine Supine to sit: HOB elevated;Mod assist Sit to supine: Mod assist General bed mobility comments: patient able to initiate LE movement to EOB but required assist to come to upright with trunk and rotate hips to EOB Transfers Overall transfer level: Needs assistance Equipment used: None Transfers: Sit to/from [facility] Sit to Stand: Mod assist Stand pivot transfers: Max assist General transfer comment: Face to face assist with gait belt, unable to reach full standing position Modified Rankin (Stroke Patients Only) Pre-Morbid Rankin Score: Moderately severe disability Modified Rankin: Moderately severe disability      PT Goals (current goals  can now be found in the care plan section) Acute Rehab PT Goals Patient Stated Goal: be able to do what I was PT Goal Formulation: With patient Time For Goal Achievement: 02/10/14 Potential to Achieve Goals: Good  Visit Information  Last PT Received On: 02/05/14 Assistance Needed: +1 History of Present Illness: presenting with acute onset of weakness on the left.  Patient has a baseline right hemiplegia secondary to a previous stroke.  Head CT was negative, full workup in progress    Subjective Data  Subjective: I feel okay today Patient Stated Goal: be able to do what I was   Cognition  Cognition Arousal/Alertness: Awake/alert Behavior During Therapy: WFL for tasks assessed/performed Overall Cognitive Status: Within Functional Limits for tasks assessed    Balance  Balance Sitting-balance support: Feet supported Sitting balance-Leahy Scale: Fair  End of Session PT - End of Session Equipment Utilized During Treatment: Gait belt Activity Tolerance: Patient tolerated treatment well Patient left: in chair;with call bell/phone within reach;with chair alarm set   GP     Duncan Dull 02/05/2014, 12:02 PM Alben Deeds, [place] DPT  [phone]

## 2014-02-05 NOTE — Progress Notes (Signed)
[facility] PROGRESS NOTE   [name] [id number] DOB: January 05, 1919 DOA: 02/02/2014 PCP: Christinia Gully, MD   Assessment/Plan:  Acute embolic CVA, secondary to atrial fib.  Workup complete Likely switch to elequis per neuro Dysphagia: modified diet  Atrial fibrillation -Currently rate controlled, with Toprol-XL. -On Coumadin for secondary stroke prevention,   Klebsiella bacteremia:  This organism unlikely to cause endocarditis, septic emboli. No need for TEE. Change to IV Cipro.  Dehydration imrpoved  Left eye with erythema and purulent exudate: cipro drops  Code Status: DO NOT RESUSCITATE Family Communication: Plan discussed with the patient. Disposition Plan: SNF in am if stable   Consultants:  Neuro   Subjective: no complaints  Objective: Filed Vitals:   02/05/14 1436  BP: 142/71  Pulse: 49  Temp: 97.4 F (36.3 C)  Resp: 20    Intake/Output Summary (Last 24 hours) at 02/05/14 1717 Last data filed at 02/05/14 1630  Gross per 24 hour  Intake      0 ml  Output    800 ml  Net   -800 ml   Filed Weights   02/03/14 0159  Weight: 71.8 kg (158 lb 4.6 oz)    Exam: General: Alert and awake, oriented x3, talkative HEENT: left cornea cloudy with erythema and tearing CVS: S1-S2 clear, no murmur rubs or gallops Chest: clear to auscultation bilaterally, no wheezing, rales or rhonchi Abdomen: soft nontender, nondistended, normal bowel sounds, no organomegaly Extremities: no cyanosis, clubbing or edema noted bilaterally Neuro: left sided weakness  Data Reviewed: Basic Metabolic Panel:  Recent Labs Lab 02/01/14 02/02/14 1910 02/04/14 0534  NA 139 137 142  K 4.5 4.3 4.2  CL  --  100 107  CO2  --  22 25  GLUCOSE  --  135* 90  BUN 21 29* 32*  CREATININE 1.1 1.22 1.31  CALCIUM  --  8.4 8.1*   Liver Function Tests:  Recent Labs Lab 02/01/14 02/04/14 0534  AST 11* 143*  ALT 8* 194*  ALKPHOS 96 234*  BILITOT  --  1.5*  PROT  --  5.7*   ALBUMIN  --  2.5*   No results found for this basename: LIPASE, AMYLASE,  in the last 168 hours No results found for this basename: AMMONIA,  in the last 168 hours CBC:  Recent Labs Lab 02/01/14 02/02/14 1910 02/04/14 0534  WBC 6.1 11.7* 12.6*  NEUTROABS  --  11.3*  --   HGB 10.8* 11.1* 10.1*  HCT 33* 34.4* 32.0*  MCV  --  90.5 92.0  PLT 282 226 194   Cardiac Enzymes:  Recent Labs Lab 02/02/14 2210  TROPONINI <0.30   BNP (last 3 results) No results found for this basename: PROBNP,  in the last 8760 hours CBG:  Recent Labs Lab 02/04/14 1702 02/04/14 2140 02/05/14 0636 02/05/14 1143 02/05/14 1633  GLUCAP 96 128* 104* 127* 117*    Micro Recent Results (from the past 240 hour(s))  CULTURE, BLOOD (ROUTINE X 2)     Status: None   Collection Time    02/02/14  7:10 PM      Result Value Ref Range Status   Specimen Description BLOOD RIGHT HAND   Final   Special Requests BOTTLES DRAWN AEROBIC AND ANAEROBIC 4CC EACH   Final   Culture  Setup Time     Final   Value: 02/03/2014 02:58     Performed at [facility]   Culture     Final   Value:  KLEBSIELLA PNEUMONIAE     Note: Gram Stain Report Called to,Read Back By and Verified With: A. TEPE 02/03/14 @ 12:20PM BY RUSCOE A.     Performed at [facility]   Report Status 02/05/2014 FINAL   Final   Organism ID, Bacteria KLEBSIELLA PNEUMONIAE   Final  CULTURE, BLOOD (ROUTINE X 2)     Status: None   Collection Time    02/02/14  7:30 PM      Result Value Ref Range Status   Specimen Description BLOOD RIGHT HAND   Final   Special Requests BOTTLES DRAWN AEROBIC AND ANAEROBIC 4CC   Final   Culture  Setup Time     Final   Value: 02/03/2014 02:58     Performed at [facility]   Culture     Final   Value: KLEBSIELLA PNEUMONIAE     Note: SUSCEPTIBILITIES PERFORMED ON PREVIOUS CULTURE WITHIN THE LAST 5 DAYS.     Note: Gram Stain Report Called to,Read Back By and Verified With: A. TEPE 02/03/14 @ 12:20PM BY  RUSCOE A.     Performed at [facility]   Report Status 02/05/2014 FINAL   Final  MRSA PCR SCREENING     Status: None   Collection Time    02/03/14  7:46 AM      Result Value Ref Range Status   MRSA by PCR NEGATIVE  NEGATIVE Final   Comment:            The GeneXpert MRSA Assay (FDA     approved for NASAL specimens     only), is one component of a     comprehensive MRSA colonization     surveillance program. It is not     intended to diagnose MRSA     infection nor to guide or     monitor treatment for     MRSA infections.     Studies: Mri Brain Without Contrast  02/04/2014   CLINICAL DATA:  80 year old male with left side weakness, new trouble with cord may shin. Initial encounter.  EXAM: MRI HEAD WITHOUT CONTRAST  MRA HEAD WITHOUT CONTRAST  TECHNIQUE: Multiplanar, multiecho pulse sequences of the brain and surrounding structures were obtained without intravenous contrast. Angiographic images of the head were obtained using MRA technique without contrast.  COMPARISON:  Head CTs 02/02/2014 and earlier.  FINDINGS: MRI HEAD FINDINGS  Chronic prominent posterior fossa extra-axial CSF, unchanged over the series of exams. Cerebral volume has not significantly changed since 2012.  Major intracranial vascular flow voids are preserved. However, there are 2 small areas of acute restricted diffusion identified. Small cortical and white matter area of restricted diffusion in the anterior left frontal lobe (pre motor and near the Broca's area). Also there is a small focus of restricted diffusion in the left lateral medulla oblong cauda (series 4, image 6).  Corresponding mild T2 and FLAIR hyperintensity at both of these areas with no associated mass effect and no definite acute hemorrhage. There are superimposed chronic micro hemorrhages, including in the area of left frontal lobe restricted diffusion (series 10, image 88).  No other restricted diffusion. Chronic lacunar infarcts in the pons.  Prominent perivascular spaces in the basal ganglia. Patchy and confluent bilateral cerebral white matter nonspecific T2 and FLAIR hyperintensity.  No ventriculomegaly. No intracranial mass effect. Negative pituitary and upper cervical spine. Normal bone marrow signal. Mastoids are clear. Visible internal auditory structures appear normal. Minor paranasal sinus mucosal thickening. Visualized scalp soft tissues  are within normal limits.  Degenerative changes at the left TMJ with joint effusion. Postoperative changes to the right globe. Dependent area of decreased signal in the left petrous, corresponding to chronic increased density on CT.  MRA HEAD FINDINGS  Normal antegrade flow signal in the distal right vertebral artery which appears mildly dominant. There is some antegrade flow in the visible distal left V2 segment, but no antegrade flow in the left V3 or proximal V4 segment. There is a small segment of preserved flow signal just proximal to the left vertebrobasilar junction, and this includes the level of the left PICA origin which seems to remain patent.  The distal right vertebral artery primarily supplies the basilar. There is basilar artery irregularity but no hemodynamically significant stenosis. AICA origins are patent. SCA origins are patent. Fetal type bilateral PCA origins.  Apparent decreased flow signal in both PCA branches, but appears symmetric.  Antegrade flow in both ICA siphons. Mild ICA tortuosity and irregularity compatible with atherosclerosis. No ICA stenosis identified. Patent carotid termini. Ophthalmic and posterior communicating artery origins are within normal limits.  Patent MCA and ACA origins. Dominant right ACA A1 segment. Fenestrated but otherwise negative anterior communicating artery. Negative visualized ACA branches.  Negative visualized right MCA branches except for mild irregularity. Increased irregularity at the left MCA origin and M1 segment. Slightly decreased intensity of  flow signal in the left MCA branches compared to the right. No major left MCA branch occlusion identified.  IMPRESSION: 1. Small acute left lateral medullary and left anterior frontal lobe (pre motor/Broca's area) infarcts. No associated mass effect. Petechial hemorrhage versus chronic micro hemorrhage in the left frontal lobe lesion, but no malignant hemorrhagic transformation. 2. Decreased flow or occlusion of the distal left vertebral artery corresponding to the lateral medullary infarct. Left MCA M1 and branch irregularity with no focal stenosis or major branch occlusion. 3. Chronic small vessel disease in the brainstem. Symmetric appearing decreased bilateral PCA flow on the MRA, favor artifact. 4. Chronic posterior fossa extra-axial CSF (chronic arachnoid cyst versus mega cisterna magna variant). 5. Chronic signal changes in the left globe, on MRI resembles a dislocated the lens, but on comparison CTs more resembles sequelae of chronic retinal or choroid hemorrhage. Salient findings discussed by telephone with Dr. Remus Blake On 02/04/2014 at 08:40 .   Electronically Signed   By: Lars Pinks M.D.   On: 02/04/2014 08:37   Dg Swallowing Func-speech Pathology  02/04/2014   Orbie Pyo Welsh, CCC-SLP     02/04/2014 11:38 AM Objective Swallowing Evaluation: Modified Barium Swallowing Study   Patient Details  Name: JECORY VOCE MRN: [id number] Date of Birth: 1919-02-26  Today's Date: 02/04/2014 Time: 1040-1100 SLP Time Calculation (min): 20 min  Past Medical History:  Past Medical History  Diagnosis Date  . GERD (gastroesophageal reflux disease)   . Hypertension   . Hearing loss     wears hearing aid - right side  . Leg swelling   . Trouble swallowing   . Bruises easily     due to coumadin  . Weakness     difficulty walking  . Contact lens/glasses fitting   . Stroke 1999  . Cancer   . 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 Gait disturbance   . Dyslipidemia   . COPD (chronic obstructive pulmonary disease)   . Peripheral vascular  disease   . History of melanoma   . Diverticulosis   . Abnormality of gait 05/16/2013  . Choledocholithiasis with obstruction 06/28/13  .  Embolic stroke   . PAF (paroxysmal atrial fibrillation)   . Hyperlipidemia   . Bradycardia   . Protein-calorie malnutrition, severe   . Ventricular tachycardia (paroxysmal)   . Obstructive sleep apnea of adult     uses cpap, pt does not know setting  . Personal history of fall 05/29/2013  . Fracture of femoral neck, right, closed 3114  . Long term (current) use of anticoagulants     PAF and embolic CVA  . Anemia, unspecified 07/05/2013  . Xerophthalmia 07/05/2013  . 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 Actinic keratosis   . Seborrheic keratosis    Past Surgical History:  Past Surgical History  Procedure Laterality Date  . Excision of melanoma  2009    left leg  . Melanoma excision      many melanoma removed in past  . Melanoma excision  10/11/2011    Procedure: MELANOMA EXCISION;  Surgeon: Pedro Earls, MD;   Location: [facility];  Service: General;  Laterality: Left;  EXCISION  melanoma left leg with full thickness skin grafting from left  lower abdomen.  . Joint replacement  2012    r fem head fx  . Lung benign area removed   1970's  . Hernia repair  1983  . Melanoma excision  05/16/2012    Procedure: MELANOMA EXCISION;  Surgeon: Pedro Earls, MD;   Location: [facility];  Service: General;  Laterality: Left;  Nodule  Removal of Melanoma on Left Shin  . Ercp N/A 07/04/2013    Procedure: ENDOSCOPIC RETROGRADE CHOLANGIOPANCREATOGRAPHY  (ERCP);  Surgeon: Irene Shipper, MD;  Location: [facility];   Service: Endoscopy;  Laterality: N/A;   HPI:  This 80 year old male presented with nausea, vomiting, hypoxia.  He was noted to have left-sided weakness consistent with TIA/CVA  was felt to require transfer to [facility] for further  stroke workup and neurology consult. Patient does have a history  of atrial fib chronically on Coumadin.  Information taken from  Nursing [facility] indicates patient on  mechanical soft consistency with  honey thick liquids but may have thin coffee, thin water in room,  and thin milk with cereal prior to current admission.  Nursing  contacted at [facility] and report mechanical soft diet /honey thick  liquids.  Son contacted via phone and was not aware of his father  on thickened liquids and that "he failed every swallow test he  ever had".   Most current M[facility] completed at [facility].  No speech  noted available from SNF.       Assessment / Plan / Recommendation Clinical Impression  Dysphagia Diagnosis: Mild oral phase dysphagia;Moderate  pharyngeal phase dysphagia;Severe pharyngeal phase dysphagia Clinical impression: Pt. demonstrated mild oral and  moderate-severe sensorimotor based pharyngeal dysphagia.   Decreased pharyngeal sensation, reduced laryngeal elevation,  tongue base retraction resulted in silent and frank aspiration of  thin and nectar thick (large sip).  Therapeutic intervention with  chin tuck technique ineffictive and aided in aspiration events.   Vallecular and pyriform residue present (mild).  Pt. appears to  be a chronic aspirator per history and risk remains despite  diet/liquid texture somewhat due to behavioral and cognitive  impact (decreased overall awareness, increased sip size,  decreased sensation of residue).  Recommend texture upgrade to  Dys 3 and continue nectar thick liquids, SMALL sips, no straws,  pills whole applesauce.  May need to discuss liquid downgrade  with pt. versus comfort/quality of life with thin liquids  if  difficulty following precautions.      Treatment Recommendation  Therapy as outlined in treatment plan below    Diet Recommendation Dysphagia 3 (Mechanical Soft);Nectar-thick  liquid   Liquid Administration via: Cup;No straw Medication Administration: Whole meds with puree Supervision: Patient able to self feed;Full supervision/cueing  for compensatory strategies Compensations: Slow rate;Small sips/bites;Multiple dry swallows  after each  bite/sip;Clear throat intermittently;Check for  pocketing Postural Changes and/or Swallow Maneuvers: Seated upright 90  degrees;Upright 30-60 min after meal    Other  Recommendations Oral Care Recommendations: Oral care BID   Follow Up Recommendations  Skilled Nursing facility    Frequency and Duration min 2x/week  2 weeks   Pertinent Vitals/Pain WDL            Reason for Referral Objectively evaluate swallowing function   Oral Phase Oral Preparation/Oral Phase Oral Phase: Impaired Oral - Nectar Oral - Nectar Cup: Right pocketing in lateral sulci Oral - Thin Oral - Thin Cup: Right pocketing in lateral sulci   Pharyngeal Phase Pharyngeal Phase Pharyngeal Phase: Impaired Pharyngeal - Nectar Pharyngeal - Nectar Teaspoon: Delayed swallow  initiation;Premature spillage to valleculae;Pharyngeal residue -  valleculae;Pharyngeal residue - pyriform sinuses;Reduced tongue  base retraction;Reduced laryngeal elevation Pharyngeal - Nectar Cup: Delayed swallow initiation;Pharyngeal  residue - valleculae;Pharyngeal residue - pyriform  sinuses;Reduced tongue base retraction;Reduced laryngeal  elevation;Penetration/Aspiration during swallow;Premature  spillage to pyriform sinuses;Reduced anterior laryngeal  mobility;Reduced airway/laryngeal closure;Significant aspiration  (Amount) (large sip) Penetration/Aspiration details (nectar cup): Material enters  airway, passes BELOW cords without attempt by patient to eject  out (silent aspiration) Pharyngeal - Thin Pharyngeal - Thin Teaspoon: Delayed swallow initiation;Premature  spillage to pyriform sinuses;Premature spillage to  valleculae;Penetration/Aspiration during swallow;Reduced tongue  base retraction;Reduced airway/laryngeal closure;Reduced  laryngeal elevation;Pharyngeal residue - valleculae;Pharyngeal  residue - pyriform sinuses;Moderate aspiration Penetration/Aspiration details (thin teaspoon): Material enters  airway, passes BELOW cords without attempt by patient to eject  out  (silent aspiration) Pharyngeal - Thin Cup: Penetration/Aspiration during  swallow;Reduced laryngeal elevation;Reduced airway/laryngeal  closure;Pharyngeal residue - pyriform sinuses;Pharyngeal residue  - valleculae;Delayed swallow initiation;Premature spillage to  valleculae (aspiration during chin tuck ) Penetration/Aspiration details (thin cup): Material enters  airway, passes BELOW cords without attempt by patient to eject  out (silent aspiration) Pharyngeal - Solids Pharyngeal - Regular: Within functional limits  Cervical Esophageal Phase    GO    Cervical Esophageal Phase Cervical Esophageal Phase:  (? tight UES)         Cranford Mon.Ed CCC-SLP Pager [phone]  02/04/2014   Mr Jodene Nam Head/brain Wo Cm  02/04/2014   CLINICAL DATA:  80 year old male with left side weakness, new trouble with cord may shin. Initial encounter.  EXAM: MRI HEAD WITHOUT CONTRAST  MRA HEAD WITHOUT CONTRAST  TECHNIQUE: Multiplanar, multiecho pulse sequences of the brain and surrounding structures were obtained without intravenous contrast. Angiographic images of the head were obtained using MRA technique without contrast.  COMPARISON:  Head CTs 02/02/2014 and earlier.  FINDINGS: MRI HEAD FINDINGS  Chronic prominent posterior fossa extra-axial CSF, unchanged over the series of exams. Cerebral volume has not significantly changed since 2012.  Major intracranial vascular flow voids are preserved. However, there are 2 small areas of acute restricted diffusion identified. Small cortical and white matter area of restricted diffusion in the anterior left frontal lobe (pre motor and near the Broca's area). Also there is a small focus of restricted diffusion in the left lateral medulla oblong cauda (series 4, image 6).  Corresponding mild T2  and FLAIR hyperintensity at both of these areas with no associated mass effect and no definite acute hemorrhage. There are superimposed chronic micro hemorrhages, including in the area of left frontal  lobe restricted diffusion (series 10, image 88).  No other restricted diffusion. Chronic lacunar infarcts in the pons. Prominent perivascular spaces in the basal ganglia. Patchy and confluent bilateral cerebral white matter nonspecific T2 and FLAIR hyperintensity.  No ventriculomegaly. No intracranial mass effect. Negative pituitary and upper cervical spine. Normal bone marrow signal. Mastoids are clear. Visible internal auditory structures appear normal. Minor paranasal sinus mucosal thickening. Visualized scalp soft tissues are within normal limits.  Degenerative changes at the left TMJ with joint effusion. Postoperative changes to the right globe. Dependent area of decreased signal in the left petrous, corresponding to chronic increased density on CT.  MRA HEAD FINDINGS  Normal antegrade flow signal in the distal right vertebral artery which appears mildly dominant. There is some antegrade flow in the visible distal left V2 segment, but no antegrade flow in the left V3 or proximal V4 segment. There is a small segment of preserved flow signal just proximal to the left vertebrobasilar junction, and this includes the level of the left PICA origin which seems to remain patent.  The distal right vertebral artery primarily supplies the basilar. There is basilar artery irregularity but no hemodynamically significant stenosis. AICA origins are patent. SCA origins are patent. Fetal type bilateral PCA origins.  Apparent decreased flow signal in both PCA branches, but appears symmetric.  Antegrade flow in both ICA siphons. Mild ICA tortuosity and irregularity compatible with atherosclerosis. No ICA stenosis identified. Patent carotid termini. Ophthalmic and posterior communicating artery origins are within normal limits.  Patent MCA and ACA origins. Dominant right ACA A1 segment. Fenestrated but otherwise negative anterior communicating artery. Negative visualized ACA branches.  Negative visualized right MCA branches except  for mild irregularity. Increased irregularity at the left MCA origin and M1 segment. Slightly decreased intensity of flow signal in the left MCA branches compared to the right. No major left MCA branch occlusion identified.  IMPRESSION: 1. Small acute left lateral medullary and left anterior frontal lobe (pre motor/Broca's area) infarcts. No associated mass effect. Petechial hemorrhage versus chronic micro hemorrhage in the left frontal lobe lesion, but no malignant hemorrhagic transformation. 2. Decreased flow or occlusion of the distal left vertebral artery corresponding to the lateral medullary infarct. Left MCA M1 and branch irregularity with no focal stenosis or major branch occlusion. 3. Chronic small vessel disease in the brainstem. Symmetric appearing decreased bilateral PCA flow on the MRA, favor artifact. 4. Chronic posterior fossa extra-axial CSF (chronic arachnoid cyst versus mega cisterna magna variant). 5. Chronic signal changes in the left globe, on MRI resembles a dislocated the lens, but on comparison CTs more resembles sequelae of chronic retinal or choroid hemorrhage. Salient findings discussed by telephone with Dr. Remus Blake On 02/04/2014 at 08:40 .   Electronically Signed   By: Lars Pinks M.D.   On: 02/04/2014 08:37    Scheduled Meds: . aspirin  81 mg Oral Daily  . famotidine  20 mg Oral Daily  . feeding supplement (ENSURE COMPLETE)  237 mL Oral Q24H  . pantoprazole  40 mg Oral Daily  . piperacillin-tazobactam (ZOSYN)  IV  3.375 g Intravenous Q8H  . polyethylene glycol  17 g Oral QODAY   Continuous Infusions:    Time spent: 35 minutes  [facility] Pager [phone] If 7PM-7AM, please contact night-coverage at  [email or web address], password [facility] 02/05/2014, 5:17 PM  LOS: 3 days

## 2014-02-05 NOTE — Clinical Social Work Psychosocial (Signed)
Clinical Social Work Department BRIEF PSYCHOSOCIAL ASSESSMENT 02/05/2014  Patient:  [name],[name]     Account Number:  [id number]     Admit date:  02/02/2014  Clinical Social Worker:  Donna Christen  Date/Time:  02/05/2014 12:03 PM  Referred by:  Physician  Date Referred:  02/05/2014 Referred for  ALF Placement  SNF Placement   Other Referral:   none.   Interview type:  Patient Other interview type:   Spoke with medical team and [facility] admissions liaison.    PSYCHOSOCIAL DATA Living Status:  FACILITY Admitted from facility:  [facility] Level of care:  Assisted Living Primary support name:  Treshon Stannard Primary support relationship to patient:  CHILD, ADULT Degree of support available:   Strong support system.    CURRENT CONCERNS Current Concerns  Post-Acute Placement   Other Concerns:   none.    SOCIAL WORK ASSESSMENT / PLAN CSW received a call from [facility] admissions liaison regarding pt's disposition. Admissions liaison stated that they could provide pt with a SNF bed at time of discharge prior to pt transferring back to ALF.    CSW met with pt at bedside. CSW discussed CSW role at [facility]. Pt was receptive to speaking with CSW. Pt and CSW discussed pt's discharge disposition. Pt stated that he agreed with admission liaison in that he needed to return to "[facility]" (SNF portion of [facility]) prior to returning to his home (ALF portion of [place]). Pt informed CSW that he has been to SNF before and is agreeable to return.    CSW discussed information above with RN and PT. RN informed CSW that pt's son was requesting that CSW speak to him regarding discharge disposition. CSW to speak with pt's son with pt's permission.    CSW to fax clinical information to [facility] and pursue SNF placement at time of discharge.   Assessment/plan status:  Psychosocial Support/Ongoing Assessment of  Needs Other assessment/ plan:   none.   Information/referral to community resources:   Pt returning to [facility] in SNF portion.    PATIENTS/FAMILYS RESPONSE TO PLAN OF CARE: Pt was very understanding and agreeable to SNF placement at [facility] prior to returning to his home. Pt understands that he is weak from being in the hospital, and that PT will help him build strength back up prior to being on his own.       Pati Gallo, [place] Social Worker [phone]

## 2014-02-05 NOTE — Progress Notes (Signed)
Stroke Team Progress Note  HISTORY [name] is an 80 y.o. male with a history of stroke and atrial fibrillation on Coumadin who was noted at his facility to have trouble using his left side. 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 Today 02/03/2014 he had trouble transferring and was unable to use his left arm to feed himself. It is unclear when his symptoms began. The patient was found to have a subtherapeutic INR on 2/27 and was being treated with Lovenox as well as his Coumadin. He was not considered for TPA as delay in arrival and INR greater than 2.0 on Coumadin and Lovenox   SUBJECTIVE Stable. More alert and interactive but dysrathria and left hemiplegia persists.  OBJECTIVE Most recent Vital Signs: Filed Vitals:   02/04/14 2107 02/05/14 0220 02/05/14 0602 02/05/14 0957  BP: 140/66 149/65 136/79 154/89  Pulse: 55 57 51 63  Temp: 99.2 F (37.3 C) 98.2 F (36.8 C) 98.8 F (37.1 C) 98.6 F (37 C)  TempSrc: Oral Axillary Axillary Oral  Resp: 18 20 20 18   Height:      Weight:      SpO2: 100% 99% 100% 100%   CBG (last 3)   Recent Labs  02/04/14 1702 02/04/14 2140 02/05/14 0636  GLUCAP 96 128* 104*    IV Fluid Intake:     MEDICATIONS  . aspirin  81 mg Oral Daily  . famotidine  20 mg Oral Daily  . feeding supplement (ENSURE COMPLETE)  237 mL Oral Q24H  . pantoprazole  40 mg Oral Daily  . piperacillin-tazobactam (ZOSYN)  IV  3.375 g Intravenous Q8H  . polyethylene glycol  17 g Oral QODAY   PRN:  acetaminophen, bisacodyl, feeding supplement (ENSURE COMPLETE), polyvinyl alcohol  Diet:  Dysphagia 3 nectar thick liquids Activity:   Bathroom privileges with assistance DVT Prophylaxis:  none  CLINICALLY SIGNIFICANT STUDIES Basic Metabolic Panel:   Recent Labs Lab 02/02/14 1910 02/04/14 0534  NA 137 142  K 4.3 4.2  CL 100 107  CO2 22 25  GLUCOSE 135* 90  BUN 29* 32*  CREATININE 1.22 1.31  CALCIUM 8.4 8.1*   Liver Function Tests:    Recent Labs Lab 02/01/14 02/04/14 0534  AST 11* 143*  ALT 8* 194*  ALKPHOS 96 234*  BILITOT  --  1.5*  PROT  --  5.7*  ALBUMIN  --  2.5*   CBC:   Recent Labs Lab 02/02/14 1910 02/04/14 0534  WBC 11.7* 12.6*  NEUTROABS 11.3*  --   HGB 11.1* 10.1*  HCT 34.4* 32.0*  MCV 90.5 92.0  PLT 226 194   Coagulation:   Recent Labs Lab 02/02/14 2210 02/03/14 0630 02/04/14 0534 02/05/14 0449  LABPROT 22.3* 24.5* 23.9* 19.5*  INR 2.03* 2.29* 2.22* 1.70*   Cardiac Enzymes:   Recent Labs Lab 02/02/14 2210  TROPONINI <0.30   Urinalysis:   Recent Labs Lab 02/02/14 1941  COLORURINE AMBER*  LABSPEC 1.021  PHURINE 7.0  GLUCOSEU NEGATIVE  HGBUR NEGATIVE  BILIRUBINUR SMALL*  KETONESUR NEGATIVE  PROTEINUR NEGATIVE  UROBILINOGEN 4.0*  NITRITE NEGATIVE  LEUKOCYTESUR NEGATIVE   Lipid Panel    Component Value Date/Time   CHOL 129 02/03/2014 0430   TRIG 99 02/03/2014 0430   HDL 45 02/03/2014 0430   CHOLHDL 2.9 02/03/2014 0430   VLDL 20 02/03/2014 0430   LDLCALC 64 02/03/2014 0430   HgbA1C  Lab Results  Component Value Date   HGBA1C 5.6 02/03/2014  Urine Drug Screen:   No results found for this basename: labopia,  cocainscrnur,  labbenz,  amphetmu,  thcu,  labbarb    Alcohol Level: No results found for this basename: ETH,  in the last 168 hours   CT of the brain  02/03/2014   1. No acute intracranial pathology seen on CT. 2. Moderate cortical volume loss and scattered small vessel ischemic microangiopathy. Apparent chronic lacunar infarct at the pons. 3. Marked cerebellar atrophy or hypoplasia noted; underlying arachnoid cyst again may be present.   MRI, MRA of the brain  02/04/2014   1. Small acute left lateral medullary and left anterior frontal lobe (pre motor/Broca's area) infarcts. No associated mass effect. Petechial hemorrhage versus chronic micro hemorrhage in the left frontal lobe lesion, but no malignant hemorrhagic transformation. 2. Decreased flow or occlusion of the  distal left vertebral artery corresponding to the lateral medullary infarct. Left MCA M1 and branch irregularity with no focal stenosis or major branch occlusion. 3. Chronic small vessel disease in the brainstem. Symmetric appearing decreased bilateral PCA flow on the MRA, favor artifact. 4. Chronic posterior fossa extra-axial CSF (chronic arachnoid cyst versus mega cisterna magna variant). 5. Chronic signal changes in the left globe, on MRI resembles a dislocated the lens, but on comparison CTs more resembles sequelae of chronic retinal or choroid hemorrhage.   2D Echocardiogram   - Left ventricle: Wall thickness was increased in a pattern of mild LVH. Systolic function was normal. The estimated ejection fraction was in the range of 55% to 60%. - Aortic valve: There was mild stenosis.  - Left atrium: The atrium was moderately dilated. - Atrial septum: No defect or patent foramen ovale was identified.  Carotid Doppler  No evidence of hemodynamically significant internal carotid artery stenosis. Vertebral artery flow is antegrade.   CXR  02/02/2014    No acute cardiopulmonary disease.   EKG  normal sinus rhythm. For complete results please see formal report.   Therapy Recommendations SNF  Physical Exam    elderly male not in distress.Awake alert. Afebrile. Head is nontraumatic. Neck is supple without bruit.  . Cardiac exam no murmur or gallop. Lungs are clear to auscultation. Distal pulses are well felt. Neurologic Examination:  Mental Status:  Alert, oriented, thought content appropriate. Speech fluent without evidence of aphasia. Able to follow simple commands without difficulty.  Cranial Nerves:  II: Discs flat bilaterally; Blind in the left eye, Left pupil clouded, right pupil round, reactive to light  III,IV, VI: ptosis not present, extra-ocular motions intact bilaterally  V,VII: right facial droop, facial light touch sensation normal bilaterally  VIII: hearing decreased  IX,X: gag  reflex reduced  XI: shoulder shrug decreased on the right  XII: midline tongue extension  Motor:  Right : Upper extremity 0/5 Left: Upper extremity 5/5  Lower extremity 0/5 Lower extremity 5-/5  Increased tone on the right  Sensory: Pinprick and light touch intact throughout, bilaterally  Deep Tendon Reflexes: Increased on the right  Plantars:  Right: upgoing Left: upgoing  Cerebellar:  Finger to nose testing with dysmetria on the left  Gait: Unable to test due to weakness  CV: pulses palpable throughout     ASSESSMENT [name] is a 80 y.o. male presenting with left-sided weakness.  Imaging confirms a left lateral medullary and left anterior frontal lobe infarcts infarct. Infarcts felt to be embolic secondary to known atrial fibrillation.  On warfarin prior to admission. Now on aspirin 81 mg orally every day  for secondary stroke prevention. INR 1.7. Concern for endocarditis given bacteremia. Pathogen is klebsiella pneumoniae. Patient with resultant left hemiparesis.    Bacteremia, blood cultures positive GNR x 2, placed on zosyn, Pt 99.2 and WBC is 12.6  atrial fibrillation, rate controlled hypertension Hx stroke 1999  Hx Hyperlipidemia, LDL 64, on no statin PTA, now on no statin, at goal LDL < 100   Blind left eye  Peripheral vascular disease  Obstructive sleep apnea uses CPAP  Hospital day # 3  TREATMENT/PLAN  Consider use of Eliquis in this patient for secondary stroke prevention - pharmacy can dose - Dr. Leonie Man to review pathogen  For now, continue  aspirin 81 mg orally every day for secondary stroke prevention, INR now down 1.7  Add lovenox for VTE prophylaxis  Cost of eliquis, case manager consulted  SNF, pt from [facility] with plans to return there  Burnetta Sabin, MSN, RN, ANVP-BC, ANP-BC, GNP-BC [facility] Pager: [phone] 02/05/2014 11:54 AM  I have personally obtained a history, examined the patient, evaluated imaging  results, and formulated the assessment and plan of care. I agree with the above.  Antony Contras, MD

## 2014-02-06 DIAGNOSIS — M6281 Muscle weakness (generalized): Secondary | ICD-10-CM

## 2014-02-06 DIAGNOSIS — G459 Transient cerebral ischemic attack, unspecified: Secondary | ICD-10-CM

## 2014-02-06 LAB — PROTIME-INR
INR: 1.38 (ref 0.00–1.49)
PROTHROMBIN TIME: 16.6 s — AB (ref 11.6–15.2)

## 2014-02-06 LAB — GLUCOSE, CAPILLARY
GLUCOSE-CAPILLARY: 106 mg/dL — AB (ref 70–99)
Glucose-Capillary: 120 mg/dL — ABNORMAL HIGH (ref 70–99)
Glucose-Capillary: 119 mg/dL — ABNORMAL HIGH (ref 70–99)

## 2014-02-06 MED ORDER — APIXABAN 5 MG PO TABS
5.0000 mg | ORAL_TABLET | Freq: Two times a day (BID) | ORAL | Status: DC
  Administered 2014-02-06: 5 mg via ORAL
  Filled 2014-02-06 (×2): qty 1

## 2014-02-06 MED ORDER — CIPROFLOXACIN HCL 500 MG PO TABS: 500.0000 mg | ORAL_TABLET | Freq: Two times a day (BID) | ORAL | Status: DC

## 2014-02-06 MED ORDER — APIXABAN 5 MG PO TABS: 5.0000 mg | ORAL_TABLET | Freq: Two times a day (BID) | ORAL | Status: AC

## 2014-02-06 NOTE — Progress Notes (Signed)
Benefit check in progress for the copay / cost of Wallie Char [phone]

## 2014-02-06 NOTE — Progress Notes (Signed)
Stroke Team Progress Note  HISTORY [name] is an 80 y.o. male with a history of stroke and atrial fibrillation on Coumadin who was noted at his facility to have trouble using his left side. 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 Today 02/03/2014 he had trouble transferring and was unable to use his left arm to feed himself. It is unclear when his symptoms began. The patient was found to have a subtherapeutic INR on 2/27 and was being treated with Lovenox as well as his Coumadin. He was not considered for TPA as delay in arrival and INR greater than 2.0 on Coumadin and Lovenox   SUBJECTIVE Son at bedside.  OBJECTIVE Most recent Vital Signs: Filed Vitals:   02/06/14 0128 02/06/14 0453 02/06/14 0635 02/06/14 1020  BP: 150/78 178/83 165/70 151/76  Pulse: 55 62  61  Temp: 97.2 F (36.2 C) 97.2 F (36.2 C)  98.1 F (36.7 C)  TempSrc: Oral Oral  Oral  Resp: 16 18  18   Height:      Weight:      SpO2: 98% 100%  98%   CBG (last 3)   Recent Labs  02/05/14 1633 02/05/14 2147 02/06/14 0634  GLUCAP 117* 126* 120*    IV Fluid Intake:     MEDICATIONS  . apixaban  5 mg Oral BID  . artificial tears   Both Eyes QHS  . ciprofloxacin  1 drop Left Eye Q2H while awake  . ciprofloxacin  400 mg Intravenous Q12H  . famotidine  20 mg Oral Daily  . feeding supplement (ENSURE COMPLETE)  237 mL Oral Q24H  . pantoprazole  40 mg Oral Daily  . polyethylene glycol  17 g Oral QODAY   PRN:  acetaminophen, bisacodyl, feeding supplement (ENSURE COMPLETE), polyvinyl alcohol  Diet:  Dysphagia 3 nectar thick liquids Activity:   Bathroom privileges with assistance DVT Prophylaxis:  none  CLINICALLY SIGNIFICANT STUDIES Basic Metabolic Panel:   Recent Labs Lab 02/02/14 1910 02/04/14 0534  NA 137 142  K 4.3 4.2  CL 100 107  CO2 22 25  GLUCOSE 135* 90  BUN 29* 32*  CREATININE 1.22 1.31  CALCIUM 8.4 8.1*   Liver Function Tests:   Recent Labs Lab 02/01/14  02/04/14 0534  AST 11* 143*  ALT 8* 194*  ALKPHOS 96 234*  BILITOT  --  1.5*  PROT  --  5.7*  ALBUMIN  --  2.5*   CBC:   Recent Labs Lab 02/02/14 1910 02/04/14 0534  WBC 11.7* 12.6*  NEUTROABS 11.3*  --   HGB 11.1* 10.1*  HCT 34.4* 32.0*  MCV 90.5 92.0  PLT 226 194   Coagulation:   Recent Labs Lab 02/03/14 0630 02/04/14 0534 02/05/14 0449 02/06/14 0530  LABPROT 24.5* 23.9* 19.5* 16.6*  INR 2.29* 2.22* 1.70* 1.38   Cardiac Enzymes:   Recent Labs Lab 02/02/14 2210  TROPONINI <0.30   Urinalysis:   Recent Labs Lab 02/02/14 1941  COLORURINE AMBER*  LABSPEC 1.021  PHURINE 7.0  GLUCOSEU NEGATIVE  HGBUR NEGATIVE  BILIRUBINUR SMALL*  KETONESUR NEGATIVE  PROTEINUR NEGATIVE  UROBILINOGEN 4.0*  NITRITE NEGATIVE  LEUKOCYTESUR NEGATIVE   Lipid Panel    Component Value Date/Time   CHOL 129 02/03/2014 0430   TRIG 99 02/03/2014 0430   HDL 45 02/03/2014 0430   CHOLHDL 2.9 02/03/2014 0430   VLDL 20 02/03/2014 0430   LDLCALC 64 02/03/2014 0430   HgbA1C  Lab Results  Component Value Date  HGBA1C 5.6 02/03/2014    Urine Drug Screen:   No results found for this basename: labopia,  cocainscrnur,  labbenz,  amphetmu,  thcu,  labbarb    Alcohol Level: No results found for this basename: ETH,  in the last 168 hours   CT of the brain  02/03/2014   1. No acute intracranial pathology seen on CT. 2. Moderate cortical volume loss and scattered small vessel ischemic microangiopathy. Apparent chronic lacunar infarct at the pons. 3. Marked cerebellar atrophy or hypoplasia noted; underlying arachnoid cyst again may be present.   MRI, MRA of the brain  02/04/2014   1. Small acute left lateral medullary and left anterior frontal lobe (pre motor/Broca's area) infarcts. No associated mass effect. Petechial hemorrhage versus chronic micro hemorrhage in the left frontal lobe lesion, but no malignant hemorrhagic transformation. 2. Decreased flow or occlusion of the distal left vertebral artery  corresponding to the lateral medullary infarct. Left MCA M1 and branch irregularity with no focal stenosis or major branch occlusion. 3. Chronic small vessel disease in the brainstem. Symmetric appearing decreased bilateral PCA flow on the MRA, favor artifact. 4. Chronic posterior fossa extra-axial CSF (chronic arachnoid cyst versus mega cisterna magna variant). 5. Chronic signal changes in the left globe, on MRI resembles a dislocated the lens, but on comparison CTs more resembles sequelae of chronic retinal or choroid hemorrhage.   2D Echocardiogram   - Left ventricle: Wall thickness was increased in a pattern of mild LVH. Systolic function was normal. The estimated ejection fraction was in the range of 55% to 60%. - Aortic valve: There was mild stenosis.  - Left atrium: The atrium was moderately dilated. - Atrial septum: No defect or patent foramen ovale was identified.  Carotid Doppler  No evidence of hemodynamically significant internal carotid artery stenosis. Vertebral artery flow is antegrade.   CXR  02/02/2014    No acute cardiopulmonary disease.   EKG  normal sinus rhythm. For complete results please see formal report.   Therapy Recommendations SNF  Physical Exam    elderly male not in distress.Awake alert. Afebrile. Head is nontraumatic. Neck is supple without bruit.  . Cardiac exam no murmur or gallop. Lungs are clear to auscultation. Distal pulses are well felt. Neurologic Examination:  Mental Status:  Alert, oriented, thought content appropriate. Speech fluent without evidence of aphasia. Able to follow simple commands without difficulty.  Cranial Nerves:  II: Discs flat bilaterally; Blind in the left eye, Left pupil clouded, right pupil round, reactive to light  III,IV, VI: ptosis not present, extra-ocular motions intact bilaterally  V,VII: right facial droop, facial light touch sensation normal bilaterally  VIII: hearing decreased  IX,X: gag reflex reduced  XI: shoulder  shrug decreased on the right  XII: midline tongue extension  Motor:  Right : Upper extremity 0/5 Left: Upper extremity 5/5  Lower extremity 0/5 Lower extremity 5-/5  Increased tone on the right  Sensory: Pinprick and light touch intact throughout, bilaterally  Deep Tendon Reflexes: Increased on the right  Plantars:  Right: upgoing Left: upgoing  Cerebellar:  Finger to nose testing with dysmetria on the left  Gait: Unable to test due to weakness  CV: pulses palpable throughout     ASSESSMENT [name] is a 80 y.o. male presenting with left-sided weakness.  Imaging confirms a left lateral medullary and left anterior frontal lobe infarcts infarct. Infarcts felt to be embolic secondary to known atrial fibrillation.  On warfarin prior to admission. Now on  eliquis 5 bid for secondary stroke prevention. INR 1.7. Concern for endocarditis given bacteremia. Pathogen is klebsiella pneumoniae - organism unlikely to cause endocarditis, septic emboli. Patient with resultant left hemiparesis.    Bacteremia, blood cultures positive GNR x 2, placed on zosyn, Pt 99.2 and WBC is 12.6  atrial fibrillation, rate controlled hypertension Hx stroke 1999  Hx Hyperlipidemia, LDL 64, on no statin PTA, now on no statin, at goal LDL < 100   Blind left eye  Peripheral vascular disease  Obstructive sleep apnea uses CPAP  Hospital day # 4  TREATMENT/PLAN  Continue Eliquis 5 mg bid in this patient for secondary stroke prevention   SNF, pt from [facility] with plans to return there  No further stroke workup indicated.  Patient has a 10-15% risk of having another stroke over the next year, the highest risk is within 2 weeks of the most recent stroke/TIA (risk of having a stroke following a stroke or TIA is the same).  Ongoing risk factor control by Primary Care Physician  Stroke Service will sign off. Please call should any needs arise.  Follow up with Dr. Leonie Man, [facility], in 2  months.  Burnetta Sabin, MSN, RN, ANVP-BC, ANP-BC, [facility] Pager: [phone] 02/06/2014 10:39 AM  I have personally obtained a history, examined the patient, evaluated imaging results, and formulated the assessment and plan of care. I agree with the above. Antony Contras, MD

## 2014-02-06 NOTE — Discharge Summary (Signed)
Physician Discharge Summary  [name] [id number] DOB: 1919-01-17 DOA: 02/02/2014  PCP: Christinia Gully, MD  Admit date: 02/02/2014 Discharge date: 02/06/2014  Time spent: greater than 30 minutes  Recommendations for Outpatient Follow-up:  1. Continue PT, OT, ST at SNF  Discharge Diagnoses:   Acute ischemic stroke, embolicwith left upper extremity weakness and oral pharyngeal dysphagia. Active Problems:   HYPERTENSION   Long term current use of anticoagulant   Atrial fibrillation   Anemia of chronic disease   Dehydration   Sepsis, Klebsiella Pneumonia   Discharge Condition: stable  Code status DNR  Filed Weights   02/03/14 0159  Weight: 71.8 kg (158 lb 4.6 oz)    History of present illness:  80 y.o. 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); Gait disturbance; Dyslipidemia; COPD (chronic obstructive pulmonary disease); Peripheral vascular disease; History of melanoma; Diverticulosis; Abnormality of gait (05/16/2013); Choledocholithiasis with obstruction (06/28/13); Embolic stroke; PAF (paroxysmal atrial fibrillation); Hyperlipidemia; Bradycardia; Protein-calorie malnutrition, severe; Ventricular tachycardia (paroxysmal); Obstructive sleep apnea of adult; Personal history of fall (05/29/2013); Fracture of femoral neck, right, closed (3114); Long term (current) use of anticoagulants; Anemia, unspecified (07/05/2013); Xerophthalmia (07/05/2013); Deafness in right ear; Actinic keratosis; and Seborrheic keratosis.  Presented with  [facility]  recently had had declined by mouth intake, left sided weakness, trouble with coordination unable to transfer self independently from bed and have dropped his spoon on the left side. Duration of his symptoms is  unclear. But he had been seen for this at 9 AM this AM. Patient has history of remote CVA with residual right-sided weakness and contracture. He has history of paroxysmal atrial fibrillation on Coumadin and metoprol. At his baseline patient is able to ambulate with a walker. Recently his INR was subtherapeutic at 1.4 and he was given double the dose of Coumadin. Given above-mentioned symptoms today patient was started on Lovenox until his INR is to become therapeutic. He was also on frequent neurochecks every 2 hours. And his metoprolol was increased to 25 mg twice a day given elevated blood pressure. his Lasix has been held due to decreased by mouth intake. As can be seen by records patient developed nausea and vomiting and possibly transient hypoxia at which point he was sent to [facility] emergency department. In ED R. his blood pressure trended down to 99/56. Patient was not noted to be febrile he was written for 1 L of normal saline. Currently on 2 L oxygen saturation oxygen 97%. Patient is still confused and lethargic unable to tell me why he is here. No active vomiting while in emergency department. Left grip strength 4/5. CT showed nothing acute.  Hospital Course:  Patient was transferred to [facility] for stroke workup. MRI did in fact show Small acute left lateral medullary and left anterior frontal lobe (pre motor/Broca's area) infarcts. No associated mass effect. Petechial hemorrhage versus chronic micro hemorrhage in the left frontal lobe lesion, but no malignant hemorrhagic transformation. 2. Decreased flow or occlusion of the distal left vertebral artery corresponding to the lateral medullary infarct. Left MCA M1 and branch irregularity with no focal stenosis or major branch occlusion. Patient also had blood cultures come back positive for gram-negative rods. Patient was placed on Eliquis per neurology's recommendations. 2D Echocardiogram  - Left ventricle: Wall thickness was increased in a pattern of  mild LVH. Systolic function was normal. The estimated ejection fraction was in the range of 55% to 60%. - Aortic valve: There was mild stenosis.  - Left atrium: The atrium was moderately dilated. - Atrial septum: No defect or patent foramen ovale was identified.  Carotid Doppler No evidence of hemodynamically significant internal carotid artery stenosis. Vertebral artery flow is antegrade.  EKG normal sinus rhythm.  Therapy Recommendations SNF  LDL 64 By discharge patient's strength was improved but not at back to baseline. Speech therapy consulted and recommended dysphagia 3 diet with nectar thickened liquids.  Blood cultures had been drawn in the emergency room. They came back positive for gram-negative rods. Patient was started on Zosyn. Cultures grew out Klebsiella pneumonia. Pan sensitive, so was switched to ciprofloxacin. The patient will need to complete a two-week course total of antibiotics. Urinalysis negative. Urine culture thus far is negative. Chest x-ray negative.  Atrial fibrillation remained rate controlled.  On admission, patient was dehydrated. Diuretics were stopped. He was given hydration. Euvolemic at discharge.  Patient is stable for discharge to a skilled nursing facility.  Procedures:  None  Consultations:  neurology  Discharge Exam: Filed Vitals:   02/06/14 1020  BP: 151/76  Pulse: 61  Temp: 98.1 F (36.7 C)  Resp: 18    General: eating lunch. Alert, oriented comfortable Cardiovascular: irregularly irregular Respiratory: clear to auscultation bilaterally without wheezes rhonchi or rales Neurologic: Left hand strength improved. Right UE ith contractures. Bilateral lower extremities 4+ out of 5 strength.  Discharge Instructions  Discharge Orders   Future Appointments Provider Department Dept Phone   04/04/2014 3:45 PM Estill Dooms, MD [facility] [phone]   09/26/2014 2:00 PM Man Otho Darner, NP [place] [phone]   Future  Orders Complete By Expires   Discharge instructions  As directed    Comments:     Mechanical soft with nectar thickened liquids   Walk with assistance  As directed        Medication List    STOP taking these medications       enoxaparin 30 MG/0.3ML injection  Commonly known as:  LOVENOX     furosemide 20 MG tablet  Commonly known as:  LASIX     ranitidine 150 MG tablet  Commonly known as:  ZANTAC     warfarin 5 MG tablet  Commonly known as:  COUMADIN      TAKE these medications       acetaminophen 325 MG tablet  Commonly known as:  TYLENOL  Take 650 mg by mouth every 6 (six) hours as needed for pain.     apixaban 5 MG Tabs tablet  Commonly known as:  ELIQUIS  Take 1 tablet (5 mg total) by mouth 2 (two) times daily.     bisacodyl 10 MG suppository  Commonly known as:  DULCOLAX  Place 10 mg rectally every 12 (twelve) hours as needed for moderate constipation.     ciprofloxacin 500 MG tablet  Commonly known as:  CIPRO  Take 1 tablet (500 mg total) by mouth 2 (two) times daily. Through 3/14, then stop     hydroxypropyl methylcellulose 2.5 % ophthalmic solution  Commonly known as:  ISOPTO TEARS  Place 1 drop into both eyes 4 (four) times daily as needed (eye irritation).     meclizine 25 MG tablet  Commonly known as:  ANTIVERT  Take 25 mg by mouth 3 (three) times daily as needed for dizziness.     metoprolol  tartrate 25 MG tablet  Commonly known as:  LOPRESSOR  Take 25 mg by mouth 2 (two) times daily.     [place] 200-200-20 MG/5ML suspension  Generic drug:  alum & mag hydroxide-simeth  Take 30 mLs by mouth every 6 (six) hours as needed for indigestion or heartburn.     omeprazole 20 MG capsule  Commonly known as:  PRILOSEC  Take 20 mg by mouth every morning.     polyethylene glycol packet  Commonly known as:  MIRALAX / GLYCOLAX  Take 17 g by mouth every other day.     SYSTANE NIGHTTIME Oint  Place 1 application into both eyes at bedtime.        Allergies  Allergen Reactions  . Sulfonamide Derivatives Rash    REACTION: rash       Follow-up Information   Schedule an appointment as soon as possible for a visit with Forbes Cellar, MD. (Stroke Clinic)    Specialties:  Neurology, Radiology   Contact information:   [address] [place] [address] [phone]       Follow up with Christinia Gully, MD In 2 weeks.   Specialty:  Pulmonary Disease   Contact information:   [address] [place] [address] [phone]        The results of significant diagnostics from this hospitalization (including imaging, microbiology, ancillary and laboratory) are listed below for reference.    Significant Diagnostic Studies: Ct Head Wo Contrast  02/03/2014   CLINICAL DATA:  Fever, nausea and vomiting. Decreased O2 saturation.  EXAM: CT HEAD WITHOUT CONTRAST  TECHNIQUE: Contiguous axial images were obtained from the base of the skull through the vertex without intravenous contrast.  COMPARISON:  CT of the head performed 05/29/2013  FINDINGS: There is no evidence of acute infarction, mass lesion, or intra- or extra-axial hemorrhage on CT.  Prominence of the ventricles and sulci reflects moderate cortical volume loss. There is marked cerebellar atrophy or hypoplasia; an underlying arachnoid cyst again may be present. Scattered periventricular and subcortical white matter change likely reflects small vessel ischemic microangiopathy. An apparent chronic lacunar infarct is seen at the pons.  The fourth ventricle are within normal limits. The basal ganglia are unremarkable in appearance. The cerebral hemispheres demonstrate grossly normal gray-white differentiation. No mass effect or midline shift is seen.  There is no evidence of fracture; visualized osseous structures are unremarkable in appearance. The visualized portions of the orbits are within normal limits. The paranasal sinuses and mastoid air cells are well-aerated. No  significant soft tissue abnormalities are seen.  IMPRESSION: 1. No acute intracranial pathology seen on CT. 2. Moderate cortical volume loss and scattered small vessel ischemic microangiopathy. Apparent chronic lacunar infarct at the pons. 3. Marked cerebellar atrophy or hypoplasia noted; underlying arachnoid cyst again may be present.   Electronically Signed   By: Garald Balding M.D.   On: 02/03/2014 00:01   Mri Brain Without Contrast  02/04/2014   CLINICAL DATA:  80 year old male with left side weakness, new trouble with cord may shin. Initial encounter.  EXAM: MRI HEAD WITHOUT CONTRAST  MRA HEAD WITHOUT CONTRAST  TECHNIQUE: Multiplanar, multiecho pulse sequences of the brain and surrounding structures were obtained without intravenous contrast. Angiographic images of the head were obtained using MRA technique without contrast.  COMPARISON:  Head CTs 02/02/2014 and earlier.  FINDINGS: MRI HEAD FINDINGS  Chronic prominent posterior fossa extra-axial CSF, unchanged over the series of exams. Cerebral volume has not significantly changed since  2012.  Major intracranial vascular flow voids are preserved. However, there are 2 small areas of acute restricted diffusion identified. Small cortical and white matter area of restricted diffusion in the anterior left frontal lobe (pre motor and near the Broca's area). Also there is a small focus of restricted diffusion in the left lateral medulla oblong cauda (series 4, image 6).  Corresponding mild T2 and FLAIR hyperintensity at both of these areas with no associated mass effect and no definite acute hemorrhage. There are superimposed chronic micro hemorrhages, including in the area of left frontal lobe restricted diffusion (series 10, image 88).  No other restricted diffusion. Chronic lacunar infarcts in the pons. Prominent perivascular spaces in the basal ganglia. Patchy and confluent bilateral cerebral white matter nonspecific T2 and FLAIR hyperintensity.  No  ventriculomegaly. No intracranial mass effect. Negative pituitary and upper cervical spine. Normal bone marrow signal. Mastoids are clear. Visible internal auditory structures appear normal. Minor paranasal sinus mucosal thickening. Visualized scalp soft tissues are within normal limits.  Degenerative changes at the left TMJ with joint effusion. Postoperative changes to the right globe. Dependent area of decreased signal in the left petrous, corresponding to chronic increased density on CT.  MRA HEAD FINDINGS  Normal antegrade flow signal in the distal right vertebral artery which appears mildly dominant. There is some antegrade flow in the visible distal left V2 segment, but no antegrade flow in the left V3 or proximal V4 segment. There is a small segment of preserved flow signal just proximal to the left vertebrobasilar junction, and this includes the level of the left PICA origin which seems to remain patent.  The distal right vertebral artery primarily supplies the basilar. There is basilar artery irregularity but no hemodynamically significant stenosis. AICA origins are patent. SCA origins are patent. Fetal type bilateral PCA origins.  Apparent decreased flow signal in both PCA branches, but appears symmetric.  Antegrade flow in both ICA siphons. Mild ICA tortuosity and irregularity compatible with atherosclerosis. No ICA stenosis identified. Patent carotid termini. Ophthalmic and posterior communicating artery origins are within normal limits.  Patent MCA and ACA origins. Dominant right ACA A1 segment. Fenestrated but otherwise negative anterior communicating artery. Negative visualized ACA branches.  Negative visualized right MCA branches except for mild irregularity. Increased irregularity at the left MCA origin and M1 segment. Slightly decreased intensity of flow signal in the left MCA branches compared to the right. No major left MCA branch occlusion identified.  IMPRESSION: 1. Small acute left lateral  medullary and left anterior frontal lobe (pre motor/Broca's area) infarcts. No associated mass effect. Petechial hemorrhage versus chronic micro hemorrhage in the left frontal lobe lesion, but no malignant hemorrhagic transformation. 2. Decreased flow or occlusion of the distal left vertebral artery corresponding to the lateral medullary infarct. Left MCA M1 and branch irregularity with no focal stenosis or major branch occlusion. 3. Chronic small vessel disease in the brainstem. Symmetric appearing decreased bilateral PCA flow on the MRA, favor artifact. 4. Chronic posterior fossa extra-axial CSF (chronic arachnoid cyst versus mega cisterna magna variant). 5. Chronic signal changes in the left globe, on MRI resembles a dislocated the lens, but on comparison CTs more resembles sequelae of chronic retinal or choroid hemorrhage. Salient findings discussed by telephone with Dr. Paris Lore On 02/04/2014 at 08:40 .   Electronically Signed   By: Augusto Gamble M.D.   On: 02/04/2014 08:37   Dg Chest Port 1 View  02/02/2014   CLINICAL DATA:  Short of breath.  EXAM: PORTABLE CHEST - 1 VIEW  COMPARISON:  08/09/2013  FINDINGS: Cardiac silhouette is normal in size. Aorta is uncoiled. No mediastinal or hilar masses. No acute findings in the lungs. No pleural effusion or pneumothorax. The bony thorax is diffusely demineralized but grossly intact. No change from the prior study.  IMPRESSION: No acute cardiopulmonary disease.   Electronically Signed   By: Lajean Manes M.D.   On: 02/02/2014 18:49   Dg Swallowing Func-speech Pathology  02/04/2014   Orbie Pyo [place], CCC-SLP     02/04/2014 11:38 AM Objective Swallowing Evaluation: Modified Barium Swallowing Study   Patient Details  Name: [name] MRN: [id number] Date of Birth: 03-16-1919  Today's Date: 02/04/2014 Time: 1040-1100 SLP Time Calculation (min): 20 min  Past Medical History:  Past Medical History  Diagnosis Date  . GERD (gastroesophageal reflux disease)   . Hypertension    . Hearing loss     wears hearing aid - right side  . Leg swelling   . Trouble swallowing   . Bruises easily     due to coumadin  . Weakness     difficulty walking  . Contact lens/glasses fitting   . Stroke 1999  . Cancer   . 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 Gait disturbance   . Dyslipidemia   . COPD (chronic obstructive pulmonary disease)   . Peripheral vascular disease   . History of melanoma   . Diverticulosis   . Abnormality of gait 05/16/2013  . Choledocholithiasis with obstruction 06/28/13  . Embolic stroke   . PAF (paroxysmal atrial fibrillation)   . Hyperlipidemia   . Bradycardia   . Protein-calorie malnutrition, severe   . Ventricular tachycardia (paroxysmal)   . Obstructive sleep apnea of adult     uses cpap, pt does not know setting  . Personal history of fall 05/29/2013  . Fracture of femoral neck, right, closed 3114  . Long term (current) use of anticoagulants     PAF and embolic CVA  . Anemia, unspecified 07/05/2013  . Xerophthalmia 07/05/2013  . 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 Actinic keratosis   . Seborrheic keratosis    Past Surgical History:  Past Surgical History  Procedure Laterality Date  . Excision of melanoma  2009    left leg  . Melanoma excision      many melanoma removed in past  . Melanoma excision  10/11/2011    Procedure: MELANOMA EXCISION;  Surgeon: Pedro Earls, MD;   Location: [facility];  Service: General;  Laterality: Left;  EXCISION  melanoma left leg with full thickness skin grafting from left  lower abdomen.  . Joint replacement  2012    r fem head fx  . Lung benign area removed   1970's  . Hernia repair  1983  . Melanoma excision  05/16/2012    Procedure: MELANOMA EXCISION;  Surgeon: Pedro Earls, MD;   Location: [facility];  Service: General;  Laterality: Left;  Nodule  Removal of Melanoma on Left Shin  . Ercp N/A 07/04/2013    Procedure: ENDOSCOPIC RETROGRADE CHOLANGIOPANCREATOGRAPHY  (ERCP);  Surgeon: Irene Shipper, MD;  Location: [facility];   Service: Endoscopy;  Laterality: N/A;   HPI:  This  80 year old male presented with nausea, vomiting, hypoxia.  He was noted to have left-sided weakness consistent with TIA/CVA  was felt to require transfer to [facility] for further  stroke workup and neurology consult. Patient does have a history  of atrial  fib chronically on Coumadin.  Information taken from  Nursing [facility] indicates patient on mechanical soft consistency with  honey thick liquids but may have thin coffee, thin water in room,  and thin milk with cereal prior to current admission.  Nursing  contacted at [facility] and report mechanical soft diet /honey thick  liquids.  Son contacted via phone and was not aware of his father  on thickened liquids and that "he failed every swallow test he  ever had".   Most current MBS completed at [facility].  No speech  noted available from SNF.       Assessment / Plan / Recommendation Clinical Impression  Dysphagia Diagnosis: Mild oral phase dysphagia;Moderate  pharyngeal phase dysphagia;Severe pharyngeal phase dysphagia Clinical impression: Pt. demonstrated mild oral and  moderate-severe sensorimotor based pharyngeal dysphagia.   Decreased pharyngeal sensation, reduced laryngeal elevation,  tongue base retraction resulted in silent and frank aspiration of  thin and nectar thick (large sip).  Therapeutic intervention with  chin tuck technique ineffictive and aided in aspiration events.   Vallecular and pyriform residue present (mild).  Pt. appears to  be a chronic aspirator per history and risk remains despite  diet/liquid texture somewhat due to behavioral and cognitive  impact (decreased overall awareness, increased sip size,  decreased sensation of residue).  Recommend texture upgrade to  Dys 3 and continue nectar thick liquids, SMALL sips, no straws,  pills whole applesauce.  May need to discuss liquid downgrade  with pt. versus comfort/quality of life with thin liquids if  difficulty following precautions.      Treatment Recommendation  Therapy as outlined in  treatment plan below    Diet Recommendation Dysphagia 3 (Mechanical Soft);Nectar-thick  liquid   Liquid Administration via: Cup;No straw Medication Administration: Whole meds with puree Supervision: Patient able to self feed;Full supervision/cueing  for compensatory strategies Compensations: Slow rate;Small sips/bites;Multiple dry swallows  after each bite/sip;Clear throat intermittently;Check for  pocketing Postural Changes and/or Swallow Maneuvers: Seated upright 90  degrees;Upright 30-60 min after meal    Other  Recommendations Oral Care Recommendations: Oral care BID   Follow Up Recommendations  Skilled Nursing facility    Frequency and Duration min 2x/week  2 weeks   Pertinent Vitals/Pain WDL            Reason for Referral Objectively evaluate swallowing function   Oral Phase Oral Preparation/Oral Phase Oral Phase: Impaired Oral - Nectar Oral - Nectar Cup: Right pocketing in lateral sulci Oral - Thin Oral - Thin Cup: Right pocketing in lateral sulci   Pharyngeal Phase Pharyngeal Phase Pharyngeal Phase: Impaired Pharyngeal - Nectar Pharyngeal - Nectar Teaspoon: Delayed swallow  initiation;Premature spillage to valleculae;Pharyngeal residue -  valleculae;Pharyngeal residue - pyriform sinuses;Reduced tongue  base retraction;Reduced laryngeal elevation Pharyngeal - Nectar Cup: Delayed swallow initiation;Pharyngeal  residue - valleculae;Pharyngeal residue - pyriform  sinuses;Reduced tongue base retraction;Reduced laryngeal  elevation;Penetration/Aspiration during swallow;Premature  spillage to pyriform sinuses;Reduced anterior laryngeal  mobility;Reduced airway/laryngeal closure;Significant aspiration  (Amount) (large sip) Penetration/Aspiration details (nectar cup): Material enters  airway, passes BELOW cords without attempt by patient to eject  out (silent aspiration) Pharyngeal - Thin Pharyngeal - Thin Teaspoon: Delayed swallow initiation;Premature  spillage to pyriform sinuses;Premature spillage to   valleculae;Penetration/Aspiration during swallow;Reduced tongue  base retraction;Reduced airway/laryngeal closure;Reduced  laryngeal elevation;Pharyngeal residue - valleculae;Pharyngeal  residue - pyriform sinuses;Moderate aspiration Penetration/Aspiration details (thin teaspoon): Material enters  airway, passes BELOW cords without attempt by patient to eject  out (silent aspiration) Pharyngeal - Thin Cup:  Penetration/Aspiration during  swallow;Reduced laryngeal elevation;Reduced airway/laryngeal  closure;Pharyngeal residue - pyriform sinuses;Pharyngeal residue  - valleculae;Delayed swallow initiation;Premature spillage to  valleculae (aspiration during chin tuck ) Penetration/Aspiration details (thin cup): Material enters  airway, passes BELOW cords without attempt by patient to eject  out (silent aspiration) Pharyngeal - Solids Pharyngeal - Regular: Within functional limits  Cervical Esophageal Phase    GO    Cervical Esophageal Phase Cervical Esophageal Phase:  (? tight UES)         [place] Mon.Ed CCC-SLP Pager [phone]  02/04/2014   Mr Jodene Nam Head/brain Wo Cm  02/04/2014   CLINICAL DATA:  80 year old male with left side weakness, new trouble with cord may shin. Initial encounter.  EXAM: MRI HEAD WITHOUT CONTRAST  MRA HEAD WITHOUT CONTRAST  TECHNIQUE: Multiplanar, multiecho pulse sequences of the brain and surrounding structures were obtained without intravenous contrast. Angiographic images of the head were obtained using MRA technique without contrast.  COMPARISON:  Head CTs 02/02/2014 and earlier.  FINDINGS: MRI HEAD FINDINGS  Chronic prominent posterior fossa extra-axial CSF, unchanged over the series of exams. Cerebral volume has not significantly changed since 2012.  Major intracranial vascular flow voids are preserved. However, there are 2 small areas of acute restricted diffusion identified. Small cortical and white matter area of restricted diffusion in the anterior left frontal lobe (pre motor  and near the Broca's area). Also there is a small focus of restricted diffusion in the left lateral medulla oblong cauda (series 4, image 6).  Corresponding mild T2 and FLAIR hyperintensity at both of these areas with no associated mass effect and no definite acute hemorrhage. There are superimposed chronic micro hemorrhages, including in the area of left frontal lobe restricted diffusion (series 10, image 88).  No other restricted diffusion. Chronic lacunar infarcts in the pons. Prominent perivascular spaces in the basal ganglia. Patchy and confluent bilateral cerebral white matter nonspecific T2 and FLAIR hyperintensity.  No ventriculomegaly. No intracranial mass effect. Negative pituitary and upper cervical spine. Normal bone marrow signal. Mastoids are clear. Visible internal auditory structures appear normal. Minor paranasal sinus mucosal thickening. Visualized scalp soft tissues are within normal limits.  Degenerative changes at the left TMJ with joint effusion. Postoperative changes to the right globe. Dependent area of decreased signal in the left petrous, corresponding to chronic increased density on CT.  MRA HEAD FINDINGS  Normal antegrade flow signal in the distal right vertebral artery which appears mildly dominant. There is some antegrade flow in the visible distal left V2 segment, but no antegrade flow in the left V3 or proximal V4 segment. There is a small segment of preserved flow signal just proximal to the left vertebrobasilar junction, and this includes the level of the left PICA origin which seems to remain patent.  The distal right vertebral artery primarily supplies the basilar. There is basilar artery irregularity but no hemodynamically significant stenosis. AICA origins are patent. SCA origins are patent. Fetal type bilateral PCA origins.  Apparent decreased flow signal in both PCA branches, but appears symmetric.  Antegrade flow in both ICA siphons. Mild ICA tortuosity and irregularity  compatible with atherosclerosis. No ICA stenosis identified. Patent carotid termini. Ophthalmic and posterior communicating artery origins are within normal limits.  Patent MCA and ACA origins. Dominant right ACA A1 segment. Fenestrated but otherwise negative anterior communicating artery. Negative visualized ACA branches.  Negative visualized right MCA branches except for mild irregularity. Increased irregularity at the left MCA origin and M1 segment. Slightly decreased intensity  of flow signal in the left MCA branches compared to the right. No major left MCA branch occlusion identified.  IMPRESSION: 1. Small acute left lateral medullary and left anterior frontal lobe (pre motor/Broca's area) infarcts. No associated mass effect. Petechial hemorrhage versus chronic micro hemorrhage in the left frontal lobe lesion, but no malignant hemorrhagic transformation. 2. Decreased flow or occlusion of the distal left vertebral artery corresponding to the lateral medullary infarct. Left MCA M1 and branch irregularity with no focal stenosis or major branch occlusion. 3. Chronic small vessel disease in the brainstem. Symmetric appearing decreased bilateral PCA flow on the MRA, favor artifact. 4. Chronic posterior fossa extra-axial CSF (chronic arachnoid cyst versus mega cisterna magna variant). 5. Chronic signal changes in the left globe, on MRI resembles a dislocated the lens, but on comparison CTs more resembles sequelae of chronic retinal or choroid hemorrhage. Salient findings discussed by telephone with Dr. Remus Blake On 02/04/2014 at 08:40 .   Electronically Signed   By: Lars Pinks M.D.   On: 02/04/2014 08:37    Microbiology: Recent Results (from the past 240 hour(s))  CULTURE, BLOOD (ROUTINE X 2)     Status: None   Collection Time    02/02/14  7:10 PM      Result Value Ref Range Status   Specimen Description BLOOD RIGHT HAND   Final   Special Requests BOTTLES DRAWN AEROBIC AND ANAEROBIC 4CC EACH   Final   Culture   Setup Time     Final   Value: 02/03/2014 02:58     Performed at [facility]   Culture     Final   Value: KLEBSIELLA PNEUMONIAE     Note: Gram Stain Report Called to,Read Back By and Verified With: A. TEPE 02/03/14 @ 12:20PM BY RUSCOE A.     Performed at [facility]   Report Status 02/05/2014 FINAL   Final   Organism ID, Bacteria KLEBSIELLA PNEUMONIAE   Final  CULTURE, BLOOD (ROUTINE X 2)     Status: None   Collection Time    02/02/14  7:30 PM      Result Value Ref Range Status   Specimen Description BLOOD RIGHT HAND   Final   Special Requests BOTTLES DRAWN AEROBIC AND ANAEROBIC 4CC   Final   Culture  Setup Time     Final   Value: 02/03/2014 02:58     Performed at [facility]   Culture     Final   Value: KLEBSIELLA PNEUMONIAE     Note: SUSCEPTIBILITIES PERFORMED ON PREVIOUS CULTURE WITHIN THE LAST 5 DAYS.     Note: Gram Stain Report Called to,Read Back By and Verified With: A. TEPE 02/03/14 @ 12:20PM BY RUSCOE A.     Performed at [facility]   Report Status 02/05/2014 FINAL   Final  MRSA PCR SCREENING     Status: None   Collection Time    02/03/14  7:46 AM      Result Value Ref Range Status   MRSA by PCR NEGATIVE  NEGATIVE Final   Comment:            The GeneXpert MRSA Assay (FDA     approved for NASAL specimens     only), is one component of a     comprehensive MRSA colonization     surveillance program. It is not     intended to diagnose MRSA     infection nor to guide or  monitor treatment for     MRSA infections.  URINE CULTURE     Status: None   Collection Time    02/04/14  6:27 PM      Result Value Ref Range Status   Specimen Description URINE, CATHETERIZED   Final   Special Requests NONE   Final   Culture  Setup Time     Final   Value: 02/04/2014 19:04     Performed at [facility]     Final   Value: NO GROWTH     Performed at [facility]   Culture     Final   Value: NO GROWTH     Performed  at [facility]   Report Status 02/05/2014 FINAL   Final     Labs: Basic Metabolic Panel:  Recent Labs Lab 02/01/14 02/02/14 1910 02/04/14 0534  NA 139 137 142  K 4.5 4.3 4.2  CL  --  100 107  CO2  --  22 25  GLUCOSE  --  135* 90  BUN 21 29* 32*  CREATININE 1.1 1.22 1.31  CALCIUM  --  8.4 8.1*   Liver Function Tests:  Recent Labs Lab 02/01/14 02/04/14 0534  AST 11* 143*  ALT 8* 194*  ALKPHOS 96 234*  BILITOT  --  1.5*  PROT  --  5.7*  ALBUMIN  --  2.5*   No results found for this basename: LIPASE, AMYLASE,  in the last 168 hours No results found for this basename: AMMONIA,  in the last 168 hours CBC:  Recent Labs Lab 02/01/14 02/02/14 1910 02/04/14 0534  WBC 6.1 11.7* 12.6*  NEUTROABS  --  11.3*  --   HGB 10.8* 11.1* 10.1*  HCT 33* 34.4* 32.0*  MCV  --  90.5 92.0  PLT 282 226 194   Cardiac Enzymes:  Recent Labs Lab 02/02/14 2210  TROPONINI <0.30   BNP: BNP (last 3 results) No results found for this basename: PROBNP,  in the last 8760 hours CBG:  Recent Labs Lab 02/05/14 1143 02/05/14 1633 02/05/14 2147 02/06/14 0634 02/06/14 1106  GLUCAP 127* 117* 126* 120* 119*   Signed:  [place] [facility]  [facility] 02/06/2014, 1:00 PM

## 2014-02-06 NOTE — Progress Notes (Signed)
Speech Language Pathology Treatment: Dysphagia  Patient Details Name: [name] MRN: [id number] DOB: Jun 28, 1919 Today's Date: 02/06/2014 Time: 0920-0940 SLP Time Calculation (min): 20 min  Assessment / Plan / Recommendation Clinical Impression  Pt. Seen for skilled dysphagia treatment.  He independently stated swallow precaution and required moderate verbal/visual/tactile cues to implement during nectar liquid consumption.  Cough present following nectar exacerbated by excessive sip size.  Smaller sips paired with no phonation immediately post swallow was effective.  Recommend continue Dys 3, nectar thick and continued education for strategies.   HPI HPI: This 80 year old male presented with nausea, vomiting, hypoxia. He was noted to have left-sided weakness consistent with TIA/CVA was felt to require transfer to [facility] for further stroke workup and neurology consult. Patient does have a history of atrial fib chronically on Coumadin.  Information taken from Nursing [facility] indicates patient on mechanical soft consistency with honey thick liquids but may have thin coffee, thin water in room, and thin milk with cereal prior to current admission.  Nursing contacted at [facility] and report mechanical soft diet /honey thick liquids.  Son contacted via phone and was not aware of his father on thickened liquids and that "he failed every swallow test he ever had".   Most current MBS completed at [facility] 2006.  No speech noted available from SNF. MRI Small acute left lateral medullary and left anterior frontal lobe pre motor/Broca's area) infarcts. No associated mass effect.   Pertinent Vitals WDL  SLP Plan  Continue with current plan of care    Recommendations Diet recommendations: Dysphagia 3 (mechanical soft);Nectar-thick liquid Liquids provided via: Cup;No straw Medication Administration: Whole meds with puree Supervision: Patient able to self feed;Full supervision/cueing for compensatory  strategies Compensations: Slow rate;Small sips/bites;Multiple dry swallows after each bite/sip;Clear throat intermittently;Check for pocketing Postural Changes and/or Swallow Maneuvers: Seated upright 90 degrees;Upright 30-60 min after meal              Oral Care Recommendations: Oral care BID Follow up Recommendations: Skilled Nursing facility Plan: Continue with current plan of care    GO     [name] M.Ed [employer] [phone]  02/06/2014

## 2014-02-06 NOTE — Discharge Instructions (Signed)
Information on my medicine - ELIQUIS (apixaban)  This medication education was reviewed with me or my healthcare representative as part of my discharge preparation.  The pharmacist that spoke with me during my hospital stay was:  Lavenia Atlas, [facility]  Why was Eliquis prescribed for you? Eliquis was prescribed for you to reduce the risk of a blood clot forming that can cause a stroke if you have a medical condition called atrial fibrillation (a type of irregular heartbeat).  What do You need to know about Eliquis ? Take your Eliquis TWICE DAILY - one tablet in the morning and one tablet in the evening with or without food. If you have difficulty swallowing the tablet whole please discuss with your pharmacist how to take the medication safely.  Take Eliquis exactly as prescribed by your doctor and DO NOT stop taking Eliquis without talking to the doctor who prescribed the medication.  Stopping may increase your risk of developing a stroke.  Refill your prescription before you run out.  After discharge, you should have regular check-up appointments with your healthcare provider that is prescribing your Eliquis.  In the future your dose may need to be changed if your kidney function or weight changes by a significant amount or as you get older.  What do you do if you miss a dose? If you miss a dose, take it as soon as you remember on the same day and resume taking twice daily.  Do not take more than one dose of ELIQUIS at the same time to make up a missed dose.  Important Safety Information A possible side effect of Eliquis is bleeding. You should call your healthcare provider right away if you experience any of the following:   Bleeding from an injury or your nose that does not stop.   Unusual colored urine (red or dark brown) or unusual colored stools (red or black).   Unusual bruising for unknown reasons.   A serious fall or if you hit your head (even if there is no  bleeding).  Some medicines may interact with Eliquis and might increase your risk of bleeding or clotting while on Eliquis. To help avoid this, consult your healthcare provider or pharmacist prior to using any new prescription or non-prescription medications, including herbals, vitamins, non-steroidal anti-inflammatory drugs (NSAIDs) and supplements.  This website has more information on Eliquis (apixaban): [email or web address].

## 2014-02-06 NOTE — Progress Notes (Signed)
Benefit check per CMA PT COPAY WILL BE $30-PRIOR AUTH REQUIRED-([phone]).

## 2014-02-06 NOTE — Progress Notes (Signed)
ANTICOAGULATION CONSULT NOTE - Initial Consult  Pharmacy Consult for Eliquis Indication: atrial fibrillation  Allergies  Allergen Reactions  . Sulfonamide Derivatives Rash    REACTION: rash    Patient Measurements: Height: 5\' 2"  (157.5 cm) Weight: 158 lb 4.6 oz (71.8 kg) IBW/kg (Calculated) : 54.6  Vital Signs: Temp: 97.2 F (36.2 C) (03/04 0453) Temp src: Oral (03/04 0453) BP: 165/70 mmHg (03/04 0635) Pulse Rate: 62 (03/04 0453)  Labs:  Recent Labs  02/04/14 0534 02/05/14 0449 02/06/14 0530  HGB 10.1*  --   --   HCT 32.0*  --   --   PLT 194  --   --   LABPROT 23.9* 19.5* 16.6*  INR 2.22* 1.70* 1.38  CREATININE 1.31  --   --     Estimated Creatinine Clearance: 31.3 ml/min (by C-G formula based on Cr of 1.31).   Medical History: Past Medical History  Diagnosis Date  . GERD (gastroesophageal reflux disease)   . Hypertension   . Hearing loss     wears hearing aid - right side  . Leg swelling   . Trouble swallowing   . Bruises easily     due to coumadin  . Weakness     difficulty walking  . Contact lens/glasses fitting   . Stroke 1999  . Cancer   . 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 Gait disturbance   . Dyslipidemia   . COPD (chronic obstructive pulmonary disease)   . Peripheral vascular disease   . History of melanoma   . Diverticulosis   . Abnormality of gait 05/16/2013  . Choledocholithiasis with obstruction 06/28/13  . Embolic stroke   . PAF (paroxysmal atrial fibrillation)   . Hyperlipidemia   . Bradycardia   . Protein-calorie malnutrition, severe   . Ventricular tachycardia (paroxysmal)   . Obstructive sleep apnea of adult     uses cpap, pt does not know setting  . Personal history of fall 05/29/2013  . Fracture of femoral neck, right, closed 3114  . Long term (current) use of anticoagulants     PAF and embolic CVA  . Anemia, unspecified 07/05/2013  . Xerophthalmia 07/05/2013  . 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 Actinic keratosis   . Seborrheic  keratosis     Medications:  Prescriptions prior to admission  Medication Sig Dispense Refill  . acetaminophen (TYLENOL) 325 MG tablet Take 650 mg by mouth every 6 (six) hours as needed for pain.       Marland Kitchen alum & mag hydroxide-simeth (MINTOX) 132-440-10 MG/5ML suspension Take 30 mLs by mouth every 6 (six) hours as needed for indigestion or heartburn.      . bisacodyl (DULCOLAX) 10 MG suppository Place 10 mg rectally every 12 (twelve) hours as needed for moderate constipation.      . enoxaparin (LOVENOX) 30 MG/0.3ML injection Inject 30 mg into the skin every 12 (twelve) hours. Take until INR is between 2-3 then stop Lovenox.      . hydroxypropyl methylcellulose (ISOPTO TEARS) 2.5 % ophthalmic solution Place 1 drop into both eyes 4 (four) times daily as needed (eye irritation).       . meclizine (ANTIVERT) 25 MG tablet Take 25 mg by mouth 3 (three) times daily as needed for dizziness.       . metoprolol tartrate (LOPRESSOR) 25 MG tablet Take 25 mg by mouth 2 (two) times daily.      Marland Kitchen omeprazole (PRILOSEC) 20 MG capsule Take 20 mg by mouth every morning.       Marland Kitchen  polyethylene glycol (MIRALAX / GLYCOLAX) packet Take 17 g by mouth every other day.      . ranitidine (ZANTAC) 150 MG tablet Take 150 mg by mouth at bedtime.      Marland Kitchen warfarin (COUMADIN) 5 MG tablet Take 5 mg by mouth every evening.      Dema Severin Petrolatum-Mineral Oil (SYSTANE NIGHTTIME) OINT Place 1 application into both eyes at bedtime.      . furosemide (LASIX) 20 MG tablet 20 mg every other day.        Scheduled:  . apixaban  5 mg Oral BID  . artificial tears   Both Eyes QHS  . ciprofloxacin  1 drop Left Eye Q2H while awake  . ciprofloxacin  400 mg Intravenous Q12H  . famotidine  20 mg Oral Daily  . feeding supplement (ENSURE COMPLETE)  237 mL Oral Q24H  . pantoprazole  40 mg Oral Daily  . polyethylene glycol  17 g Oral QODAY    Assessment: 80yo male was admitted w/ CVA, not candidate for tPA d/t Coumadin/LMWH use, had been on  both Coumadin and Lovenox at time of event, now to switch to Eliquis as cleared by neuro for secondary prevention of stroke given failure of Coumadin.   Plan:  Will begin Eliquis 5mg  po BID (age 11, wt 72kg, SCr 1.3).  Wynona Neat, PharmD, BCPS  02/06/2014,7:35 AM

## 2014-02-06 NOTE — Clinical Social Work Note (Signed)
CSW has faxed discharge summary to [facility]. Discharge packet is complete and placed on pt's shadow chart. CSW has called admissions liaison at [facility] to confirm that pt's SNF bed is ready. CSW awaiting returned call from [facility] admissions liaison. CSW to call for transportation once call has been received.  RN to please call report to [facility] at [phone]  Corey Harold (ambulance) [place], [place] Worker [phone]

## 2014-02-07 ENCOUNTER — Telehealth: Payer: Self-pay | Admitting: Neurology

## 2014-02-07 NOTE — Telephone Encounter (Signed)
Please call the nurse that is on the floor @[facility] at [phone] to get pt in sooner than June.

## 2014-02-07 NOTE — Telephone Encounter (Signed)
Called patient caregiver and scheduled an earlier appt for the patient on 04/19/14. I advised the caregiver that if the patient has any other problems, questions or concerns to call the office. Patient's caregiver verbalized understanding.

## 2014-02-08 ENCOUNTER — Non-Acute Institutional Stay (SKILLED_NURSING_FACILITY): Payer: Medicare Other | Admitting: Internal Medicine

## 2014-02-08 DIAGNOSIS — I4891 Unspecified atrial fibrillation: Secondary | ICD-10-CM

## 2014-02-08 DIAGNOSIS — E43 Unspecified severe protein-calorie malnutrition: Secondary | ICD-10-CM

## 2014-02-08 DIAGNOSIS — R739 Hyperglycemia, unspecified: Secondary | ICD-10-CM

## 2014-02-08 DIAGNOSIS — I10 Essential (primary) hypertension: Secondary | ICD-10-CM

## 2014-02-08 DIAGNOSIS — I639 Cerebral infarction, unspecified: Secondary | ICD-10-CM

## 2014-02-08 DIAGNOSIS — B029 Zoster without complications: Secondary | ICD-10-CM

## 2014-02-08 DIAGNOSIS — R7309 Other abnormal glucose: Secondary | ICD-10-CM

## 2014-02-08 DIAGNOSIS — R748 Abnormal levels of other serum enzymes: Secondary | ICD-10-CM

## 2014-02-08 DIAGNOSIS — D649 Anemia, unspecified: Secondary | ICD-10-CM

## 2014-02-08 DIAGNOSIS — I6350 Cerebral infarction due to unspecified occlusion or stenosis of unspecified cerebral artery: Secondary | ICD-10-CM

## 2014-02-08 HISTORY — DX: Zoster without complications: B02.9

## 2014-02-11 ENCOUNTER — Non-Acute Institutional Stay (SKILLED_NURSING_FACILITY): Payer: Medicare Other | Admitting: Nurse Practitioner

## 2014-02-11 DIAGNOSIS — I10 Essential (primary) hypertension: Secondary | ICD-10-CM

## 2014-02-11 DIAGNOSIS — A419 Sepsis, unspecified organism: Secondary | ICD-10-CM

## 2014-02-11 DIAGNOSIS — I639 Cerebral infarction, unspecified: Secondary | ICD-10-CM

## 2014-02-11 DIAGNOSIS — D638 Anemia in other chronic diseases classified elsewhere: Secondary | ICD-10-CM

## 2014-02-11 DIAGNOSIS — I6350 Cerebral infarction due to unspecified occlusion or stenosis of unspecified cerebral artery: Secondary | ICD-10-CM

## 2014-02-11 DIAGNOSIS — I4891 Unspecified atrial fibrillation: Secondary | ICD-10-CM

## 2014-02-11 DIAGNOSIS — K5900 Constipation, unspecified: Secondary | ICD-10-CM

## 2014-02-11 DIAGNOSIS — R1310 Dysphagia, unspecified: Secondary | ICD-10-CM

## 2014-02-11 DIAGNOSIS — R609 Edema, unspecified: Secondary | ICD-10-CM

## 2014-02-11 DIAGNOSIS — K219 Gastro-esophageal reflux disease without esophagitis: Secondary | ICD-10-CM

## 2014-02-13 ENCOUNTER — Encounter: Payer: Self-pay | Admitting: Nurse Practitioner

## 2014-02-13 NOTE — Progress Notes (Signed)
Patient ID: [name], male   DOB: [birth year], 80 y.o.   MRN: [id number]   Code Status: DNR  Allergies  Allergen Reactions  . Sulfonamide Derivatives Rash    REACTION: rash    Chief Complaint  Patient presents with  . Medical Managment of Chronic Issues  . Hospitalization Follow-up    acute CVA    HPI: Patient is a 80 y.o. male seen in the SNF at [facility] today for evaluation of other chronic medical conditions.   Hospitalized from 02/02/2014 to 02/06/2014 for Acute ischemic stroke, embolicwith left upper extremity weakness and oral pharyngeal dysphagia.   Problem List Items Addressed This Visit   Unspecified constipation     Stable with MiraLax qod and pnr Dulcolax suppository q12hr. Usually BM daily     Sepsis     Blood cultures had been drawn in the emergency room. They came back positive for gram-negative rods. Patient was started on Zosyn. Cultures grew out Klebsiella pneumonia. Pan sensitive, so was switched to ciprofloxacin. The patient completed a two-week course total of antibiotics. Urinalysis negative. Urine culture thus far is negative. Chest x-ray negative.      HYPERTENSION     Controlled on Metoprolol.  Monitor Bp/P q shift.      GERD     Takes Omeprazole 20mg  and Ranitidine 150mg  and Mylanta prn-stable.     Edema     Trace pedal edema R>L-not apparent. Off TED. Continue furosemide due to decreased oral intake. Observe the patient.     Dysphagia, unspecified(787.20)     By discharge patient's strength was improved but not at back to baseline. Speech therapy consulted and recommended dysphagia 3 diet with nectar thickened liquids     CVA (cerebral vascular accident)     Right sided weakness with right wrist/hand contracture. Grip strength is still 5/5. Ambulates with walker and w/c to go further. New onset of decreased the left sided coordination 01/2014.     Atrial fibrillation     Atrial fibrillation remained rate controlled. Rate controlled  without rhythm agent. Chronic anticoagulation therapy with Coumadin for risk reduction of thromboembolic events. Patient was placed on Eliquis per neurology's recommendations. event. EKG normal sinus rhythm      Anemia of chronic disease     Stable with Hgb 11.5 09/13/13 and 10.8 02/01/14    Acute ischemic stroke - Primary     Acute ischemic stroke, embolicwith left upper extremity weakness and oral pharyngeal dysphagia. Carotid Doppler No evidence of hemodynamically significant internal carotid artery stenosis. Vertebral artery flow is antegrade.          Review of Systems:  Review of Systems  Constitutional: Positive for malaise/fatigue. Negative for fever, chills, weight loss and diaphoresis.       Generalized weakness and the right sided weakness from previous CVA. Worse-stay in bed today and very little oral intake reportedly-usually he is OOB daily.   HENT: Positive for hearing loss. Negative for congestion, ear discharge, ear pain, nosebleeds, sore throat and tinnitus.   Eyes: Negative for blurred vision, double vision, photophobia, pain, discharge and redness.       Left eye blindness  Respiratory: Negative for cough, hemoptysis, sputum production, shortness of breath, wheezing and stridor.   Cardiovascular: Positive for leg swelling. Negative for chest pain, palpitations, orthopnea, claudication and PND.       Trace pedal edema R>L  Gastrointestinal: Negative for heartburn, nausea, vomiting, abdominal pain, diarrhea, constipation, blood in stool and melena.  Genitourinary:  Negative for dysuria, urgency, frequency, hematuria and flank pain.  Musculoskeletal: Negative for back pain, falls, joint pain, myalgias and neck pain.  Skin: Negative for itching and rash.       Right cholecystostomy surgical scar. The medial left lower leg surgical scar from previous melanoma removal.   Neurological: Positive for dizziness, focal weakness and weakness. Negative for tingling, tremors, sensory  change, speech change, seizures, loss of consciousness and headaches.       Right sided weakness from previous CVA even if grip strength is 5/5. The right wrist and hand contracture. Ambulates with walker and w/c to go further. C/o progressive weakness noted in his RUE. New onset of the left sided decreased coordination-cannot transfer self independently and dropped his utensil  Endo/Heme/Allergies: Negative for environmental allergies and polydipsia. Bruises/bleeds easily.  Psychiatric/Behavioral: Negative for depression, suicidal ideas, hallucinations, memory loss and substance abuse. The patient is not nervous/anxious and does not have insomnia.    Past Medical History  Diagnosis Date  . GERD (gastroesophageal reflux disease)   . Hypertension   . Hearing loss     wears hearing aid - right side  . Leg swelling   . Dysphagia   . Bruises easily     due to coumadin  . Weakness     difficulty walking  . Contact lens/glasses fitting   . Stroke 1999, 02/02/14  . Cancer   . 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 Gait disturbance   . COPD (chronic obstructive pulmonary disease)   . Peripheral vascular disease   . History of melanoma   . Diverticulosis   . Abnormality of gait 05/16/2013  . Choledocholithiasis with obstruction 06/28/13  . Embolic stroke [id number]  . PAF (paroxysmal atrial fibrillation)   . Hyperlipidemia   . Bradycardia   . Protein-calorie malnutrition, severe   . Ventricular tachycardia (paroxysmal)   . Obstructive sleep apnea of adult     uses cpap, pt does not know setting  . Personal history of fall 05/29/2013  . Fracture of femoral neck, right, closed 3114  . Long term (current) use of anticoagulants     PAF and embolic CVA  . Anemia, unspecified 07/05/2013  . Xerophthalmia 07/05/2013  . 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 Actinic keratosis   . Seborrheic keratosis   . Aortic valve stenosis   . Cerebrovascular disease   . Elevated liver enzymes   . Diverticulosis   . Herpes zoster  02/08/14    Left facial nerve distribution  . Right hemiparesis   . Melanoma 2009  . Hyperglycemia   . Xerophthalmia    Past Surgical History  Procedure Laterality Date  . Excision of melanoma  2009    left leg  . Melanoma excision      many melanoma removed in past  . Melanoma excision  10/11/2011    Procedure: MELANOMA EXCISION;  Surgeon: Pedro Earls, MD;  Location: [place];  Service: General;  Laterality: Left;  EXCISION melanoma left leg with full thickness skin grafting from left lower abdomen.  . Joint replacement  2012    r fem head fx  . Lung benign area removed   1970's  . Hernia repair  1983  . Melanoma excision  05/16/2012    Procedure: MELANOMA EXCISION;  Surgeon: Pedro Earls, MD;  Location: [facility];  Service: General;  Laterality: Left;  Nodule Removal of Melanoma on Left Shin  . Ercp N/A 07/04/2013    Procedure: ENDOSCOPIC RETROGRADE CHOLANGIOPANCREATOGRAPHY (ERCP);  Surgeon: Hilarie Fredrickson, MD;  Location: [facility];  Service: Endoscopy;  Laterality: N/A;   Social History:   reports that he quit smoking about 49 years ago. His smoking use included Pipe. He has never used smokeless tobacco. He reports that he does not drink alcohol or use illicit drugs.  Family History  Problem Relation Age of Onset  . Heart disease Father 3  . Cancer Son     prostate  . Dementia Sister     One sister has dementia    Medications: Patient's Medications  New Prescriptions   No medications on file  Previous Medications   ACETAMINOPHEN (TYLENOL) 325 MG TABLET    Take 650 mg by mouth every 6 (six) hours as needed for pain.    ALUM & MAG HYDROXIDE-SIMETH (MINTOX) 200-200-20 MG/5ML SUSPENSION    Take 30 mLs by mouth every 6 (six) hours as needed for indigestion or heartburn.   APIXABAN (ELIQUIS) 5 MG TABS TABLET    Take 1 tablet (5 mg total) by mouth 2 (two) times daily.   BISACODYL (DULCOLAX) 10 MG SUPPOSITORY    Place 10 mg rectally every 12 (twelve) hours as needed for  moderate constipation.   CIPROFLOXACIN (CIPRO) 500 MG TABLET    Take 1 tablet (500 mg total) by mouth 2 (two) times daily. Through 3/14, then stop   FUROSEMIDE (LASIX) 20 MG TABLET       HYDROXYPROPYL METHYLCELLULOSE (ISOPTO TEARS) 2.5 % OPHTHALMIC SOLUTION    Place 1 drop into both eyes 4 (four) times daily as needed (eye irritation).    MECLIZINE (ANTIVERT) 25 MG TABLET    Take 25 mg by mouth 3 (three) times daily as needed for dizziness.    METOPROLOL TARTRATE (LOPRESSOR) 25 MG TABLET    Take 25 mg by mouth 2 (two) times daily.   OMEPRAZOLE (PRILOSEC) 20 MG CAPSULE    Take 20 mg by mouth every morning.    POLYETHYLENE GLYCOL (MIRALAX / GLYCOLAX) PACKET    Take 17 g by mouth every other day.   WHITE PETROLATUM-MINERAL OIL (SYSTANE NIGHTTIME) OINT    Place 1 application into both eyes at bedtime.  Modified Medications   No medications on file  Discontinued Medications   No medications on file     Physical Exam: Physical Exam  Constitutional: He is oriented to person, place, and time. He appears well-developed and well-nourished. No distress.  HENT:  Head: Normocephalic and atraumatic.  Right Ear: External ear normal.  Left Ear: External ear normal.  Nose: Nose normal.  Mouth/Throat: Oropharynx is clear and moist. No oropharyngeal exudate.  Eyes: Conjunctivae are normal. Pupils are equal, round, and reactive to light. Right eye exhibits no discharge. No scleral icterus.  Left eye blindness  Neck: Normal range of motion. Neck supple. No JVD present. No tracheal deviation present. No thyromegaly present.  Cardiovascular: Normal rate and normal heart sounds.  An irregular rhythm present.  Pulmonary/Chest: Effort normal and breath sounds normal. No stridor. No respiratory distress. He has no wheezes. He has no rales. He exhibits no tenderness.  Abdominal: He exhibits no distension. There is no tenderness. There is no rebound and no guarding.  Musculoskeletal: Normal range of motion. He  exhibits edema. He exhibits no tenderness.  The right sided weakness since previous CVA-muscle strength is still 5/5. The right wrist and hand contractures-able to make fist and cannot extend fully. Ambulates with walker and w/c to go further. Trace pedal edema R>L  Lymphadenopathy:  He has no cervical adenopathy.  Neurological: He is alert and oriented to person, place, and time. He displays abnormal reflex. No cranial nerve deficit. Coordination normal.  New onset of the left sided decreased coordination-cannot transfer self independently and dropped his utensil, grip strength is still 5/5   Skin: Skin is warm and dry. No rash noted. He is not diaphoretic. No erythema. No pallor.  The right cholecystostomy surgical scar. The medial left lower leg surgical scar from previous melanoma removal.   Psychiatric: Thought content normal. His mood appears anxious. His affect is not angry and not inappropriate. His speech is not rapid and/or pressured, not delayed and not slurred. He is agitated. He is not aggressive, not hyperactive, not slowed and not withdrawn. Cognition and memory are impaired. He expresses impulsivity and inappropriate judgment. He does not exhibit a depressed mood. He exhibits abnormal recent memory. He exhibits normal remote memory.   (type .physexam) Filed Vitals:   02/11/14 1709  BP: 100/60  Pulse: 60  Temp: 97.4 F (36.3 C)  TempSrc: Tympanic  Resp: 16      Labs reviewed: Basic Metabolic Panel:  Recent Labs  06/29/13 1820  08/13/13 0425  09/13/13 02/01/14 02/02/14 1910 02/04/14 0534  NA  --   < > 137  < > 137 139 137 142  K  --   < > 3.3*  --  4.9 4.5 4.3 4.2  CL  --   < > 106  --   --   --  100 107  CO2  --   < > 24  --   --   --  22 25  GLUCOSE  --   < > 98  --   --   --  135* 90  BUN  --   < > 19  < > 19 21 29* 32*  CREATININE  --   < > 1.09  < > 1.0 1.1 1.22 1.31  CALCIUM  --   < > 8.2*  --   --   --  8.4 8.1*  MG 1.8  --   --   --   --   --   --   --    TSH  --   --   --   --  2.34  --   --   --   < > = values in this interval not displayed. Liver Function Tests:  Recent Labs  08/09/13 1749 08/10/13 0805 09/13/13 02/01/14 02/04/14 0534  AST 28 15 18  11* 143*  ALT 22 15 12  8* 194*  ALKPHOS 259* 194* 162* 96 234*  BILITOT 1.0 0.8  --   --  1.5*  PROT 6.6 5.2*  --   --  5.7*  ALBUMIN 2.5* 1.8*  --   --  2.5*    Recent Labs  06/28/13 2114 08/09/13 1805  LIPASE 18 13   CBC:  Recent Labs  06/28/13 2114  08/09/13 1749  08/13/13 0425  02/01/14 02/02/14 1910 02/04/14 0534  WBC 24.5*  < > 23.0*  < > 11.2*  < > 6.1 11.7* 12.6*  NEUTROABS 22.8*  --  20.3*  --   --   --   --  11.3*  --   HGB 11.2*  < > 12.7*  < > 11.3*  < > 10.8* 11.1* 10.1*  HCT 34.3*  < > 38.6*  < > 35.2*  < > 33* 34.4* 32.0*  MCV 89.1  < > 88.3  < >  91.0  --   --  90.5 92.0  PLT 345  < > 280  < > 311  < > 282 226 194  < > = values in this interval not displayed.  Past Procedures:  02/01/14 CXR mild cardiomegaly without pulmonary vascular congestion or pleural effusion, old granulomatous disease, no inflammatory consolidate or suspicious nodule, small patchy ares of atelectasis or non-consolidated pneumonia are seen at the lung bases.   2D Echocardiogram  - Left ventricle: Wall thickness was increased in a pattern of mild LVH. Systolic function was normal. The estimated ejection fraction was in the range of 55% to 60%. - Aortic valve: There was mild stenosis.  - Left atrium: The atrium was moderately dilated. - Atrial septum: No defect or patent foramen ovale was identified.   MRI did in fact show Small acute left lateral medullary and left anterior frontal lobe (pre motor/Broca's area) infarcts. No associated mass effect. Petechial hemorrhage versus chronic micro hemorrhage in the left frontal lobe lesion, but no malignant hemorrhagic transformation. 2. Decreased flow or occlusion of the distal left vertebral artery corresponding to the lateral medullary  infarct. Left MCA M1 and branch irregularity with no focal stenosis or major branch occlusion.   Assessment/Plan Acute ischemic stroke Acute ischemic stroke, embolicwith left upper extremity weakness and oral pharyngeal dysphagia. Carotid Doppler No evidence of hemodynamically significant internal carotid artery stenosis. Vertebral artery flow is antegrade.     Sepsis Blood cultures had been drawn in the emergency room. They came back positive for gram-negative rods. Patient was started on Zosyn. Cultures grew out Klebsiella pneumonia. Pan sensitive, so was switched to ciprofloxacin. The patient completed a two-week course total of antibiotics. Urinalysis negative. Urine culture thus far is negative. Chest x-ray negative.    Atrial fibrillation Atrial fibrillation remained rate controlled. Rate controlled without rhythm agent. Chronic anticoagulation therapy with Coumadin for risk reduction of thromboembolic events. Patient was placed on Eliquis per neurology's recommendations. event. EKG normal sinus rhythm    Dysphagia, unspecified(787.20) By discharge patient's strength was improved but not at back to baseline. Speech therapy consulted and recommended dysphagia 3 diet with nectar thickened liquids   CVA (cerebral vascular accident) Right sided weakness with right wrist/hand contracture. Grip strength is still 5/5. Ambulates with walker and w/c to go further. New onset of decreased the left sided coordination 01/2014.   Anemia of chronic disease Stable with Hgb 11.5 09/13/13 and 10.8 02/01/14  Edema Trace pedal edema R>L-not apparent. Off TED. Continue furosemide due to decreased oral intake. Observe the patient.   GERD Takes Omeprazole 20mg  and Ranitidine 150mg  and Mylanta prn-stable.   Unspecified constipation Stable with MiraLax qod and pnr Dulcolax suppository q12hr. Usually BM daily   HYPERTENSION Controlled on Metoprolol.  Monitor Bp/P q shift.      Family/ Staff  Communication: observe the patient.   Goals of Care: AL  Labs/tests ordered: none

## 2014-02-13 NOTE — Assessment & Plan Note (Addendum)
Acute ischemic stroke, embolicwith left upper extremity weakness and oral pharyngeal dysphagia. Carotid Doppler No evidence of hemodynamically significant internal carotid artery stenosis. Vertebral artery flow is antegrade.

## 2014-02-17 ENCOUNTER — Encounter: Payer: Self-pay | Admitting: Internal Medicine

## 2014-02-17 DIAGNOSIS — R739 Hyperglycemia, unspecified: Secondary | ICD-10-CM | POA: Insufficient documentation

## 2014-02-17 DIAGNOSIS — R748 Abnormal levels of other serum enzymes: Secondary | ICD-10-CM | POA: Insufficient documentation

## 2014-02-17 NOTE — Progress Notes (Signed)
Patient ID: SYLIS KETCHUM, male   DOB: [birth year], 80 y.o.   MRN: [id number]    Location:  [facility]   Place of Service: SNF (31)  PCP: Christinia Gully, MD  Code Status: DO NOT RESUSCITATE  Extended Emergency Contact Information Primary Emergency Contact: Swindle,Williams (Rod) Address: [address]          [place], [place] [address] [place] Phone: [phone] Relation: Son  Allergies  Allergen Reactions  . Sulfonamide Derivatives Rash    REACTION: rash    Chief Complaint  Patient presents with  . 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 Patient has a right hemiparesis and some weakness of the left upper extremity. He has oropharyngeal dysphagia felt secondary to his stroke and cerebrovascular disease. There multiple other problems.  In the course of his hospitalization he appeared to have sepsis with Klebsiella pneumonia. This was treated with Zosyn.  Because of trouble swallowing he has lost weight and is diagnosed with severe protein calorie malnutrition.  He had a melanoma resected in 2009. There is no known recurrence.  Hospitalized here hyperglycemia and elevated liver enzymes.  He now has a left facial rash which has typical herpetic lesions and is felt to represent shingles of the left facial nerve. Rashes but present 5 days.    Past Medical History  Diagnosis Date  . GERD (gastroesophageal reflux disease)   . Hypertension   . Hearing loss     wears hearing aid - right side  . Leg swelling   . Dysphagia   . Bruises easily     due to coumadin  . Weakness     difficulty walking  . Contact lens/glasses fitting   . Stroke 1999, 02/02/14  . Cancer   . 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 Gait disturbance   . COPD  (chronic obstructive pulmonary disease)   . Peripheral vascular disease   . History of melanoma   . Diverticulosis   . Abnormality of gait 05/16/2013  . Choledocholithiasis with obstruction 06/28/13  . Embolic stroke 2/84/13  . PAF (paroxysmal atrial fibrillation)   . Hyperlipidemia   . Bradycardia   . Protein-calorie malnutrition, severe   . Ventricular tachycardia (paroxysmal)   . Obstructive sleep apnea of adult     uses cpap, pt does not know setting  . Personal history of fall 05/29/2013  . Fracture of femoral neck, right, closed 3114  . Long term (current) use of anticoagulants     PAF and embolic CVA  . Anemia, unspecified 07/05/2013  . Xerophthalmia 07/05/2013  . 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 Actinic keratosis   . Seborrheic keratosis   . Aortic valve stenosis   . Cerebrovascular disease   . Elevated liver enzymes   . Diverticulosis   . Herpes zoster 02/08/14    Left facial nerve distribution  . Right hemiparesis   . Melanoma 2009  . Hyperglycemia   . Xerophthalmia     Past Surgical History  Procedure Laterality Date  . Excision of melanoma  2009    left leg  . Melanoma excision      many melanoma removed in past  . Melanoma excision  10/11/2011    Procedure: MELANOMA EXCISION;  Surgeon: Pedro Earls, MD;  Location:  [facility] OR;  Service: General;  Laterality: Left;  EXCISION melanoma left leg with full thickness skin grafting from left lower abdomen.  . Joint replacement  2012    r fem head fx  . Lung benign area removed   1970's  . Hernia repair  1983  . Melanoma excision  05/16/2012    Procedure: MELANOMA EXCISION;  Surgeon: Pedro Earls, MD;  Location: [facility]S;  Service: General;  Laterality: Left;  Nodule Removal of Melanoma on Left Shin  . Ercp N/A 07/04/2013    Procedure: ENDOSCOPIC RETROGRADE CHOLANGIOPANCREATOGRAPHY (ERCP);  Surgeon: Irene Shipper, MD;  Location: [facility];  Service: Endoscopy;  Laterality: N/A;    CONSULTANTS Surgery: Steffanie Rainwater Pulmonary: Melvyn Novas GI: Henrene Pastor Cardiology: Mertie Moores Neurology: Jannifer Franklin  Social History: History   Social History  . Marital Status: Widowed    Spouse Name: N/A    Number of Children: 2  . Years of Education: MBA   Occupational History  . Retired    Social History Main Topics  . Smoking status: Former Smoker    Types: Pipe    Quit date: 08/31/1964  . Smokeless tobacco: Never Used  . Alcohol Use: No  . Drug Use: No  . Sexual Activity: No   Other Topics Concern  . Not on file   Social History Narrative  . No narrative on file    Family History Family Status  Relation Status Death Age  . Father Deceased   . Son Alive   . Mother Deceased     Old age  . Sister Alive    Family History  Problem Relation Age of Onset  . Heart disease Father 18  . Cancer Son     prostate  . Dementia Sister     One sister has dementia     Medications: Patient's Medications  New Prescriptions   No medications on file  Previous Medications   ACETAMINOPHEN (TYLENOL) 325 MG TABLET    Take 650 mg by mouth every 6 (six) hours as needed for pain.    ALUM & MAG HYDROXIDE-SIMETH (MINTOX) 798-921-19 MG/5ML SUSPENSION    Take 30 mLs by mouth every 6 (six) hours as needed for indigestion or heartburn.   APIXABAN (ELIQUIS) 5 MG TABS TABLET    Take 1 tablet (5 mg total) by mouth 2 (two) times daily.   BISACODYL (DULCOLAX) 10 MG SUPPOSITORY    Place 10 mg rectally every 12 (twelve) hours as needed for moderate constipation.   CIPROFLOXACIN (CIPRO) 500 MG TABLET    Take 1 tablet (500 mg total) by mouth 2 (two) times daily. Through 3/14, then stop   FUROSEMIDE (LASIX) 20 MG TABLET       HYDROXYPROPYL METHYLCELLULOSE (ISOPTO TEARS) 2.5 % OPHTHALMIC SOLUTION    Place 1 drop into both eyes 4 (four) times daily as needed (eye irritation).    MECLIZINE (ANTIVERT) 25 MG TABLET    Take 25 mg by mouth 3 (three) times daily as needed for dizziness.    METOPROLOL TARTRATE (LOPRESSOR) 25 MG TABLET     Take 25 mg by mouth 2 (two) times daily.   OMEPRAZOLE (PRILOSEC) 20 MG CAPSULE    Take 20 mg by mouth every morning.    POLYETHYLENE GLYCOL (MIRALAX / GLYCOLAX) PACKET    Take 17 g by mouth every other day.   WHITE PETROLATUM-MINERAL OIL (SYSTANE NIGHTTIME) OINT    Place 1 application into both eyes at bedtime.  Modified Medications  No medications on file  Discontinued Medications   WARFARIN (COUMADIN) 4 MG TABLET        Immunization History  Administered Date(s) Administered  . Influenza Whole 10/06/2010, 08/07/2011, 09/20/2012  . Influenza-Unspecified 09/05/2013  . Pneumococcal Polysaccharide-23 05/06/1997  . Td 03/30/2005  . Tdap 05/29/2013     Review of Systems  Constitutional: Positive for activity change, appetite change and fatigue. Negative for fever, chills and diaphoresis.  HENT: Positive for hearing loss. Negative for congestion, dental problem and drooling.   Eyes: Positive for visual disturbance (corrective lenses).       Xerophthalmia  Respiratory: Positive for cough and shortness of breath. Negative for wheezing.   Cardiovascular: Positive for palpitations. Negative for chest pain and leg swelling.  Gastrointestinal:       Dysphagia  Endocrine: Negative.   Genitourinary: Negative.   Musculoskeletal: Positive for arthralgias, back pain and gait problem.  Skin:       Herpetic lesions of the left face in the facial nerve distribution  Allergic/Immunologic: Negative.   Neurological:       Right hemiparesis.  Hematological: Negative.   Psychiatric/Behavioral: Negative.       Filed Vitals:   02/17/14 1438  BP: 136/83  Pulse: 72  Temp: 97.6 F (36.4 C)  Resp: 18  Height: _0  (1.575 m)  Weight: 158 lb (71.668 kg)   Physical Exam  Constitutional:  Frail. Elderly. Needs help to transfer from bed to wheelchair.  HENT:  Right Ear: External ear normal.  Left Ear: External ear normal.  Nose: Nose normal.  Mouth/Throat: Oropharynx is clear and moist.    Eyes: Conjunctivae and EOM are normal. Pupils are equal, round, and reactive to light.  Corrective lenses  Neck: No JVD present. No tracheal deviation present. No thyromegaly present.  Cardiovascular:  2/6 systolic ejection murmur loudest at the aortic valve. Atrial fibrillation with controlled rate.  Respiratory: No respiratory distress. He has no wheezes. He has rales. He exhibits no tenderness.  GI: He exhibits no distension and no mass. There is no tenderness.  Musculoskeletal: Normal range of motion. He exhibits no edema and no tenderness.  Flexion contracture of the right arm.  Lymphadenopathy:    He has no cervical adenopathy.  Neurological: He is alert. A cranial nerve deficit is present. Coordination abnormal.  Right facial droop. Right hemiparesis. Weakness of left upper extremity.  Skin: Rash noted. No erythema. No pallor.  Psychiatric: He has a normal mood and affect. His behavior is normal. Thought content normal.       Labs reviewed: Admission on 02/02/2014, Discharged on 02/06/2014  Component Date Value Ref Range Status  . WBC 02/02/2014 11.7* 4.0 - 10.5 K/uL Final  . RBC 02/02/2014 3.80* 4.22 - 5.81 MIL/uL Final  . Hemoglobin 02/02/2014 11.1* 13.0 - 17.0 g/dL Final  . HCT 02/02/2014 34.4* 39.0 - 52.0 % Final  . MCV 02/02/2014 90.5  78.0 - 100.0 fL Final  . MCH 02/02/2014 29.2  26.0 - 34.0 pg Final  . MCHC 02/02/2014 32.3  30.0 - 36.0 g/dL Final  . RDW 02/02/2014 13.5  11.5 - 15.5 % Final  . Platelets 02/02/2014 226  150 - 400 K/uL Final  . Neutrophils Relative % 02/02/2014 96* 43 - 77 % Final  . Neutro Abs 02/02/2014 11.3* 1.7 - 7.7 K/uL Final  . Lymphocytes Relative 02/02/2014 2* 12 - 46 % Final  . Lymphs Abs 02/02/2014 0.2* 0.7 - 4.0 K/uL Final  . Monocytes Relative 02/02/2014 2* 3 -  12 % Final  . Monocytes Absolute 02/02/2014 0.3  0.1 - 1.0 K/uL Final  . Eosinophils Relative 02/02/2014 0  0 - 5 % Final  . Eosinophils Absolute 02/02/2014 0.0  0.0 - 0.7 K/uL  Final  . Basophils Relative 02/02/2014 0  0 - 1 % Final  . Basophils Absolute 02/02/2014 0.0  0.0 - 0.1 K/uL Final  . Sodium 02/02/2014 137  137 - 147 mEq/L Final  . Potassium 02/02/2014 4.3  3.7 - 5.3 mEq/L Final  . Chloride 02/02/2014 100  96 - 112 mEq/L Final  . CO2 02/02/2014 22  19 - 32 mEq/L Final  . Glucose, Bld 02/02/2014 135* 70 - 99 mg/dL Final  . BUN 02/02/2014 29* 6 - 23 mg/dL Final  . Creatinine, Ser 02/02/2014 1.22  0.50 - 1.35 mg/dL Final  . Calcium 02/02/2014 8.4  8.4 - 10.5 mg/dL Final  . GFR calc non Af Amer 02/02/2014 50* >90 mL/min Final  . GFR calc Af Amer 02/02/2014 57* >90 mL/min Final   Comment: (NOTE)                          The eGFR has been calculated using the CKD EPI equation.                          This calculation has not been validated in all clinical situations.                          eGFR's persistently <90 mL/min signify possible Chronic Kidney                          Disease.  . Color, Urine 02/02/2014 AMBER* YELLOW Final   BIOCHEMICALS MAY BE AFFECTED BY COLOR  . APPearance 02/02/2014 CLEAR  CLEAR Final  . Specific Gravity, Urine 02/02/2014 1.021  1.005 - 1.030 Final  . pH 02/02/2014 7.0  5.0 - 8.0 Final  . Glucose, UA 02/02/2014 NEGATIVE  NEGATIVE mg/dL Final  . Hgb urine dipstick 02/02/2014 NEGATIVE  NEGATIVE Final  . Bilirubin Urine 02/02/2014 SMALL* NEGATIVE Final  . Ketones, ur 02/02/2014 NEGATIVE  NEGATIVE mg/dL Final  . Protein, ur 02/02/2014 NEGATIVE  NEGATIVE mg/dL Final  . Urobilinogen, UA 02/02/2014 4.0* 0.0 - 1.0 mg/dL Final  . Nitrite 02/02/2014 NEGATIVE  NEGATIVE Final  . Leukocytes, UA 02/02/2014 NEGATIVE  NEGATIVE Final   MICROSCOPIC NOT DONE ON URINES WITH NEGATIVE PROTEIN, BLOOD, LEUKOCYTES, NITRITE, OR GLUCOSE <1000 mg/dL.  Marland Kitchen Specimen Description 02/02/2014 BLOOD RIGHT HAND   Final  . Special Requests 02/02/2014 BOTTLES DRAWN AEROBIC AND ANAEROBIC 4CC   Final  . Culture  Setup Time 02/02/2014    Final                    Value:02/03/2014 02:58                         Performed at [facility]  . Culture 02/02/2014    Final                   Value:KLEBSIELLA PNEUMONIAE                         Note: SUSCEPTIBILITIES PERFORMED ON PREVIOUS CULTURE WITHIN THE LAST 5 DAYS.  Note: Gram Stain Report Called to,Read Back By and Verified With: A. TEPE 02/03/14 @ 12:20PM BY RUSCOE A.                         Performed at [facility]  . Report Status 02/02/2014 02/05/2014 FINAL   Final  . Specimen Description 02/02/2014 BLOOD RIGHT HAND   Final  . Special Requests 02/02/2014 BOTTLES DRAWN AEROBIC AND ANAEROBIC 4CC EACH   Final  . Culture  Setup Time 02/02/2014    Final                   Value:02/03/2014 02:58                         Performed at [facility]  . Culture 02/02/2014    Final                   Value:KLEBSIELLA PNEUMONIAE                         Note: Gram Stain Report Called to,Read Back By and Verified With: A. TEPE 02/03/14 @ 12:20PM BY RUSCOE A.                         Performed at [facility]  . Report Status 02/02/2014 02/05/2014 FINAL   Final  . Organism ID, Bacteria 02/02/2014 KLEBSIELLA PNEUMONIAE   Final  . Prothrombin Time 02/02/2014 22.3* 11.6 - 15.2 seconds Final  . INR 02/02/2014 2.03* 0.00 - 1.49 Final  . Troponin I 02/02/2014 <0.30  <0.30 ng/mL Final   Comment:                                 Due to the release kinetics of cTnI,                          a negative result within the first hours                          of the onset of symptoms does not rule out                          myocardial infarction with certainty.                          If myocardial infarction is still suspected,                          repeat the test at appropriate intervals.  . Hemoglobin A1C 02/03/2014 5.6  <5.7 % Final   Comment: (NOTE)  According to the ADA Clinical Practice Recommendations for 2011, when                          HbA1c is used as a screening test:                           >=6.5%   Diagnostic of Diabetes Mellitus                                    (if abnormal result is confirmed)                          5.7-6.4%   Increased risk of developing Diabetes Mellitus                          References:Diagnosis and Classification of Diabetes Mellitus,Diabetes                          TMHD,6222,97(LGXQJ 1):S62-S69 and Standards of Medical Care in                                  Diabetes - 2011,Diabetes JHER,7408,14 (Suppl 1):S11-S61.  . Mean Plasma Glucose 02/03/2014 114  <117 mg/dL Final   Performed at [facility]  . Cholesterol 02/03/2014 129  0 - 200 mg/dL Final  . Triglycerides 02/03/2014 99  <150 mg/dL Final  . HDL 02/03/2014 45  >39 mg/dL Final  . Total CHOL/HDL Ratio 02/03/2014 2.9   Final  . VLDL 02/03/2014 20  0 - 40 mg/dL Final  . LDL Cholesterol 02/03/2014 64  0 - 99 mg/dL Final   Comment:                                 Total Cholesterol/HDL:CHD Risk                          Coronary Heart Disease Risk Table                                              Men   Women                           1/2 Average Risk   3.4   3.3                           Average Risk       5.0   4.4                           2 X Average Risk   9.6   7.1                           3 X Average Risk  23.4   11.0  Use the calculated Patient Ratio                          above and the CHD Risk Table                          to determine the patient's CHD Risk.                                                          ATP III CLASSIFICATION (LDL):                           <100     mg/dL   Optimal                           100-129  mg/dL   Near or Above                                             Optimal                           130-159  mg/dL   Borderline                            160-189  mg/dL   High                           >190     mg/dL   Very High  . Prothrombin Time 02/03/2014 24.5* 11.6 - 15.2 seconds Final  . INR 02/03/2014 2.29* 0.00 - 1.49 Final  . Glucose-Capillary 02/03/2014 92  70 - 99 mg/dL Final  . Comment 1 02/03/2014 Documented in Chart   Final  . Comment 2 02/03/2014 Notify RN   Final  . MRSA by PCR 02/03/2014 NEGATIVE  NEGATIVE Final   Comment:                                 The GeneXpert MRSA Assay (FDA                          approved for NASAL specimens                          only), is one component of a                          comprehensive MRSA colonization                          surveillance program. It is not                          intended to diagnose MRSA  infection nor to guide or                          monitor treatment for                          MRSA infections.  . Glucose-Capillary 02/03/2014 88  70 - 99 mg/dL Final  . Glucose-Capillary 02/03/2014 87  70 - 99 mg/dL Final  . Prothrombin Time 02/04/2014 23.9* 11.6 - 15.2 seconds Final  . INR 02/04/2014 2.22* 0.00 - 1.49 Final  . WBC 02/04/2014 12.6* 4.0 - 10.5 K/uL Final  . RBC 02/04/2014 3.48* 4.22 - 5.81 MIL/uL Final  . Hemoglobin 02/04/2014 10.1* 13.0 - 17.0 g/dL Final  . HCT 02/04/2014 32.0* 39.0 - 52.0 % Final  . MCV 02/04/2014 92.0  78.0 - 100.0 fL Final  . MCH 02/04/2014 29.0  26.0 - 34.0 pg Final  . MCHC 02/04/2014 31.6  30.0 - 36.0 g/dL Final  . RDW 02/04/2014 14.1  11.5 - 15.5 % Final  . Platelets 02/04/2014 194  150 - 400 K/uL Final  . Sodium 02/04/2014 142  137 - 147 mEq/L Final  . Potassium 02/04/2014 4.2  3.7 - 5.3 mEq/L Final  . Chloride 02/04/2014 107  96 - 112 mEq/L Final  . CO2 02/04/2014 25  19 - 32 mEq/L Final  . Glucose, Bld 02/04/2014 90  70 - 99 mg/dL Final  . BUN 02/04/2014 32* 6 - 23 mg/dL Final  . Creatinine, Ser 02/04/2014 1.31  0.50 - 1.35 mg/dL Final  . Calcium 02/04/2014 8.1* 8.4 -  10.5 mg/dL Final  . Total Protein 02/04/2014 5.7* 6.0 - 8.3 g/dL Final  . Albumin 02/04/2014 2.5* 3.5 - 5.2 g/dL Final  . AST 02/04/2014 143* 0 - 37 U/L Final  . ALT 02/04/2014 194* 0 - 53 U/L Final  . Alkaline Phosphatase 02/04/2014 234* 39 - 117 U/L Final  . Total Bilirubin 02/04/2014 1.5* 0.3 - 1.2 mg/dL Final  . GFR calc non Af Amer 02/04/2014 46* >90 mL/min Final  . GFR calc Af Amer 02/04/2014 53* >90 mL/min Final   Comment: (NOTE)                          The eGFR has been calculated using the CKD EPI equation.                          This calculation has not been validated in all clinical situations.                          eGFR's persistently <90 mL/min signify possible Chronic Kidney                          Disease.  Marland Kitchen Specimen Description 02/04/2014 URINE, CATHETERIZED   Final  . Special Requests 02/04/2014 NONE   Final  . Culture  Setup Time 02/04/2014    Final                   Value:02/04/2014 19:04                         Performed at [facility]  . Colony Count 02/04/2014    Final  Value:NO GROWTH                         Performed at [facility]  . Culture 02/04/2014    Final                   Value:NO GROWTH                         Performed at [facility]  . Report Status 02/04/2014 02/05/2014 FINAL   Final  . Glucose-Capillary 02/03/2014 122* 70 - 99 mg/dL Final  . Glucose-Capillary 02/04/2014 91  70 - 99 mg/dL Final  . Glucose-Capillary 02/04/2014 109* 70 - 99 mg/dL Final  . Prothrombin Time 02/05/2014 19.5* 11.6 - 15.2 seconds Final  . INR 02/05/2014 1.70* 0.00 - 1.49 Final  . Glucose-Capillary 02/04/2014 96  70 - 99 mg/dL Final  . Glucose-Capillary 02/04/2014 128* 70 - 99 mg/dL Final  . Comment 1 02/04/2014 Notify RN   Final  . Glucose-Capillary 02/05/2014 104* 70 - 99 mg/dL Final  . Comment 1 02/05/2014 Notify RN   Final  . Glucose-Capillary 02/05/2014 127* 70 - 99 mg/dL Final  . Glucose-Capillary 02/05/2014  117* 70 - 99 mg/dL Final  . Prothrombin Time 02/06/2014 16.6* 11.6 - 15.2 seconds Final  . INR 02/06/2014 1.38  0.00 - 1.49 Final  . Glucose-Capillary 02/05/2014 126* 70 - 99 mg/dL Final  . Comment 1 02/05/2014 Notify RN   Final  . Comment 2 02/05/2014 Documented in Chart   Final  . Glucose-Capillary 02/06/2014 120* 70 - 99 mg/dL Final  . Comment 1 02/06/2014 Notify RN   Final  . Comment 2 02/06/2014 Documented in Chart   Final  . Glucose-Capillary 02/06/2014 119* 70 - 99 mg/dL Final  . Glucose-Capillary 02/06/2014 106* 70 - 99 mg/dL Final  Nursing Home on 02/01/2014  Component Date Value Ref Range Status  . INR 02/01/2014 1.4* 0.9 - 1.1 Final  . Hemoglobin 02/01/2014 10.8* 13.5 - 17.5 g/dL Final  . HCT 02/01/2014 33* 41 - 53 % Final  . Platelets 02/01/2014 282  150 - 399 K/L Final  . WBC 02/01/2014 6.1   Final  . Glucose 02/01/2014 94   Final  . BUN 02/01/2014 21  4 - 21 mg/dL Final  . Creatinine 02/01/2014 1.1  0.6 - 1.3 mg/dL Final  . Potassium 02/01/2014 4.5  3.4 - 5.3 mmol/L Final  . Sodium 02/01/2014 139  137 - 147 mmol/L Final  . Alkaline Phosphatase 02/01/2014 96  25 - 125 U/L Final  . ALT 02/01/2014 8* 10 - 40 U/L Final  . AST 02/01/2014 11* 14 - 40 U/L Final  . Bilirubin, Total 02/01/2014 0.5   Final  Nursing Home on 12/20/2013  Component Date Value Ref Range Status  . INR 11/20/2013 2.2* 0.9 - 1.1 Final  . Protime 11/20/2013 23.6* 10.0 - 13.8 seconds Final     Assessment/Plan 1. CVA (cerebral vascular accident) This is at least the second. Probably related to atrial fibrillation. He is chronically anticoagulated.  2. Atrial fibrillation Right controlled  3. HYPERTENSION Controlled  4. Protein-calorie malnutrition, severe Swallowing difficulties make this a problem that is likely to persist  5. Hyperglycemia Follow lab  6. Herpes zoster Acute problem. Started valacyclovir 1 g 3 times a day for the next 7 days. Patient was put on contact precautions.  7.  Elevated liver enzymes Follow lab  8. Anemia,  unspecified Follow lab  Lab tests ordered: CBC, CMP, lipids   Patient is here for long-term care.

## 2014-02-18 ENCOUNTER — Encounter: Payer: Self-pay | Admitting: Nurse Practitioner

## 2014-02-18 DIAGNOSIS — R1310 Dysphagia, unspecified: Secondary | ICD-10-CM | POA: Insufficient documentation

## 2014-02-18 NOTE — Assessment & Plan Note (Addendum)
Atrial fibrillation remained rate controlled. Rate controlled without rhythm agent. Chronic anticoagulation therapy with Coumadin for risk reduction of thromboembolic events. Patient was placed on Eliquis per neurology's recommendations. event. EKG normal sinus rhythm

## 2014-02-18 NOTE — Assessment & Plan Note (Signed)
Controlled on Metoprolol.  Monitor Bp/P q shift.

## 2014-02-18 NOTE — Assessment & Plan Note (Signed)
Stable with Hgb 11.5 09/13/13 and 10.8 02/01/14  

## 2014-02-18 NOTE — Assessment & Plan Note (Signed)
Right sided weakness with right wrist/hand contracture. Grip strength is still 5/5. Ambulates with walker and w/c to go further. New onset of decreased the left sided coordination 01/2014.

## 2014-02-18 NOTE — Assessment & Plan Note (Signed)
Blood cultures had been drawn in the emergency room. They came back positive for gram-negative rods. Patient was started on Zosyn. Cultures grew out Klebsiella pneumonia. Pan sensitive, so was switched to ciprofloxacin. The patient completed a two-week course total of antibiotics. Urinalysis negative. Urine culture thus far is negative. Chest x-ray negative.

## 2014-02-18 NOTE — Assessment & Plan Note (Signed)
Takes Omeprazole 20mg and Ranitidine 150mg and Mylanta prn-stable.   

## 2014-02-18 NOTE — Assessment & Plan Note (Signed)
Trace pedal edema R>L-not apparent. Off TED. Continue furosemide due to decreased oral intake. Observe the patient.

## 2014-02-18 NOTE — Assessment & Plan Note (Signed)
By discharge patient's strength was improved but not at back to baseline. Speech therapy consulted and recommended dysphagia 3 diet with nectar thickened liquids

## 2014-02-18 NOTE — Assessment & Plan Note (Signed)
Stable with MiraLax qod and pnr Dulcolax suppository q12hr. Usually BM daily

## 2014-02-19 ENCOUNTER — Inpatient Hospital Stay: Payer: Medicare Other | Admitting: Internal Medicine

## 2014-02-25 ENCOUNTER — Non-Acute Institutional Stay: Payer: Medicare Other | Admitting: Nurse Practitioner

## 2014-02-25 ENCOUNTER — Encounter: Payer: Self-pay | Admitting: Nurse Practitioner

## 2014-02-25 DIAGNOSIS — I4891 Unspecified atrial fibrillation: Secondary | ICD-10-CM

## 2014-02-25 DIAGNOSIS — K5900 Constipation, unspecified: Secondary | ICD-10-CM

## 2014-02-25 DIAGNOSIS — R609 Edema, unspecified: Secondary | ICD-10-CM

## 2014-02-25 DIAGNOSIS — I6350 Cerebral infarction due to unspecified occlusion or stenosis of unspecified cerebral artery: Secondary | ICD-10-CM

## 2014-02-25 DIAGNOSIS — Z7901 Long term (current) use of anticoagulants: Secondary | ICD-10-CM

## 2014-02-25 DIAGNOSIS — R1310 Dysphagia, unspecified: Secondary | ICD-10-CM

## 2014-02-25 DIAGNOSIS — I10 Essential (primary) hypertension: Secondary | ICD-10-CM

## 2014-02-25 DIAGNOSIS — D638 Anemia in other chronic diseases classified elsewhere: Secondary | ICD-10-CM

## 2014-02-25 DIAGNOSIS — I639 Cerebral infarction, unspecified: Secondary | ICD-10-CM

## 2014-02-25 DIAGNOSIS — K219 Gastro-esophageal reflux disease without esophagitis: Secondary | ICD-10-CM

## 2014-02-25 NOTE — Assessment & Plan Note (Signed)
Stable with Hgb 11.5 09/13/13 and 10.8 02/01/14  

## 2014-02-25 NOTE — Progress Notes (Signed)
Patient ID: [name], male   DOB: [birth year], 80 y.o.   MRN: [id number]   Code Status: DNR  Allergies  Allergen Reactions  . Sulfonamide Derivatives Rash    REACTION: rash    Chief Complaint  Patient presents with  . Medical Managment of Chronic Issues    HPI: Patient is a 80 y.o. male seen in the SNF at [facility] today for evaluation of other chronic medical conditions.   Hospitalized from 02/02/2014 to 02/06/2014 for Acute ischemic stroke, embolicwith left upper extremity weakness and oral pharyngeal dysphagia.   Problem List Items Addressed This Visit   Anemia of chronic disease     Stable with Hgb 11.5 09/13/13 and 10.8 02/01/14     Atrial fibrillation     Atrial fibrillation remained rate controlled. Rate controlled. Patient was placed on Eliquis per neurology's recommendations. event. EKG normal sinus rhythm       CVA (cerebral vascular accident)     Right sided weakness with right wrist/hand contracture. Grip strength is still 5/5. Ambulates with walker and w/c to go further. New onset of decreased the left sided coordination 01/2014.      Dysphagia, unspecified(787.20)     y discharge patient's strength was improved but not at back to baseline. Speech therapy consulted and recommended dysphagia 3 diet with nectar thickened liquids     Edema     race pedal edema R>L-not apparent. Off TED. Continue furosemide . Observe the patient.      GERD     Takes Omeprazole 20mg  and Ranitidine 150mg  and Mylanta prn-stable.      HYPERTENSION - Primary     Controlled on Metoprolol.  Monitor Bp/P q shift.       Long term current use of anticoagulant     Hx of CVA and Afib. Switched from Coumadin to Eliquis since the past CVA 01/2014    Unspecified constipation     Stable with MiraLax qod and pnr Dulcolax suppository q12hr. Usually BM daily         Review of Systems:  Review of Systems  Constitutional: Positive for malaise/fatigue. Negative for fever,  chills, weight loss and diaphoresis.       Generalized weakness and the right sided weakness from previous CVA. Worse-stay in bed today and very little oral intake reportedly-usually he is OOB daily.   HENT: Positive for hearing loss. Negative for congestion, ear discharge, ear pain, nosebleeds, sore throat and tinnitus.   Eyes: Negative for blurred vision, double vision, photophobia, pain, discharge and redness.       Left eye blindness  Respiratory: Negative for cough, hemoptysis, sputum production, shortness of breath, wheezing and stridor.   Cardiovascular: Positive for leg swelling. Negative for chest pain, palpitations, orthopnea, claudication and PND.       Trace pedal edema R>L  Gastrointestinal: Negative for heartburn, nausea, vomiting, abdominal pain, diarrhea, constipation, blood in stool and melena.  Genitourinary: Negative for dysuria, urgency, frequency, hematuria and flank pain.  Musculoskeletal: Negative for back pain, falls, joint pain, myalgias and neck pain.  Skin: Negative for itching and rash.       Right cholecystostomy surgical scar. The medial left lower leg surgical scar from previous melanoma removal.   Neurological: Positive for dizziness, focal weakness and weakness. Negative for tingling, tremors, sensory change, speech change, seizures, loss of consciousness and headaches.       Right sided weakness from previous CVA even if grip strength is 5/5. The right wrist and  hand contracture. Ambulates with walker and w/c to go further. C/o progressive weakness noted in his RUE. New onset of the left sided decreased coordination-cannot transfer self independently and dropped his utensil  Endo/Heme/Allergies: Negative for environmental allergies and polydipsia. Bruises/bleeds easily.  Psychiatric/Behavioral: Negative for depression, suicidal ideas, hallucinations, memory loss and substance abuse. The patient is not nervous/anxious and does not have insomnia.    Past Medical  History  Diagnosis Date  . GERD (gastroesophageal reflux disease)   . Hypertension   . Hearing loss     wears hearing aid - right side  . Leg swelling   . Dysphagia   . Bruises easily     due to coumadin  . Weakness     difficulty walking  . Contact lens/glasses fitting   . Stroke 1999, 02/02/14  . Cancer   . 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 Gait disturbance   . COPD (chronic obstructive pulmonary disease)   . Peripheral vascular disease   . History of melanoma   . Diverticulosis   . Abnormality of gait 05/16/2013  . Choledocholithiasis with obstruction 06/28/13  . Embolic stroke 7/32/20  . PAF (paroxysmal atrial fibrillation)   . Hyperlipidemia   . Bradycardia   . Protein-calorie malnutrition, severe   . Ventricular tachycardia (paroxysmal)   . Obstructive sleep apnea of adult     uses cpap, pt does not know setting  . Personal history of fall 05/29/2013  . Fracture of femoral neck, right, closed 3114  . Long term (current) use of anticoagulants     PAF and embolic CVA  . Anemia, unspecified 07/05/2013  . Xerophthalmia 07/05/2013  . 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 Actinic keratosis   . Seborrheic keratosis   . Aortic valve stenosis   . Cerebrovascular disease   . Elevated liver enzymes   . Diverticulosis   . Herpes zoster 02/08/14    Left facial nerve distribution  . Right hemiparesis   . Melanoma 2009  . Hyperglycemia   . Xerophthalmia    Past Surgical History  Procedure Laterality Date  . Excision of melanoma  2009    left leg  . Melanoma excision      many melanoma removed in past  . Melanoma excision  10/11/2011    Procedure: MELANOMA EXCISION;  Surgeon: Pedro Earls, MD;  Location: [facility];  Service: General;  Laterality: Left;  EXCISION melanoma left leg with full thickness skin grafting from left lower abdomen.  . Joint replacement  2012    r fem head fx  . Lung benign area removed   1970's  . Hernia repair  1983  . Melanoma excision  05/16/2012    Procedure:  MELANOMA EXCISION;  Surgeon: Pedro Earls, MD;  Location: [facility];  Service: General;  Laterality: Left;  Nodule Removal of Melanoma on Left Shin  . Ercp N/A 07/04/2013    Procedure: ENDOSCOPIC RETROGRADE CHOLANGIOPANCREATOGRAPHY (ERCP);  Surgeon: Irene Shipper, MD;  Location: [facility];  Service: Endoscopy;  Laterality: N/A;   Social History:   reports that he quit smoking about 49 years ago. His smoking use included Pipe. He has never used smokeless tobacco. He reports that he does not drink alcohol or use illicit drugs.  Family History  Problem Relation Age of Onset  . Heart disease Father 33  . Cancer Son     prostate  . Dementia Sister     One sister has dementia    Medications: Patient's Medications  New Prescriptions   No medications on file  Previous Medications   ACETAMINOPHEN (TYLENOL) 325 MG TABLET    Take 650 mg by mouth every 6 (six) hours as needed for pain.    ALUM & MAG HYDROXIDE-SIMETH (MINTOX) [id number] MG/5ML SUSPENSION    Take 30 mLs by mouth every 6 (six) hours as needed for indigestion or heartburn.   APIXABAN (ELIQUIS) 5 MG TABS TABLET    Take 1 tablet (5 mg total) by mouth 2 (two) times daily.   BISACODYL (DULCOLAX) 10 MG SUPPOSITORY    Place 10 mg rectally every 12 (twelve) hours as needed for moderate constipation.   CIPROFLOXACIN (CIPRO) 500 MG TABLET    Take 1 tablet (500 mg total) by mouth 2 (two) times daily. Through 3/14, then stop   FUROSEMIDE (LASIX) 20 MG TABLET       HYDROXYPROPYL METHYLCELLULOSE (ISOPTO TEARS) 2.5 % OPHTHALMIC SOLUTION    Place 1 drop into both eyes 4 (four) times daily as needed (eye irritation).    MECLIZINE (ANTIVERT) 25 MG TABLET    Take 25 mg by mouth 3 (three) times daily as needed for dizziness.    METOPROLOL TARTRATE (LOPRESSOR) 25 MG TABLET    Take 25 mg by mouth 2 (two) times daily.   OMEPRAZOLE (PRILOSEC) 20 MG CAPSULE    Take 20 mg by mouth every morning.    POLYETHYLENE GLYCOL (MIRALAX / GLYCOLAX) PACKET    Take 17  g by mouth every other day.   WHITE PETROLATUM-MINERAL OIL (SYSTANE NIGHTTIME) OINT    Place 1 application into both eyes at bedtime.  Modified Medications   No medications on file  Discontinued Medications   No medications on file     Physical Exam: Physical Exam  Constitutional: He is oriented to person, place, and time. He appears well-developed and well-nourished. No distress.  HENT:  Head: Normocephalic and atraumatic.  Right Ear: External ear normal.  Left Ear: External ear normal.  Nose: Nose normal.  Mouth/Throat: Oropharynx is clear and moist. No oropharyngeal exudate.  Eyes: Conjunctivae are normal. Pupils are equal, round, and reactive to light. Right eye exhibits no discharge. No scleral icterus.  Left eye blindness  Neck: Normal range of motion. Neck supple. No JVD present. No tracheal deviation present. No thyromegaly present.  Cardiovascular: Normal rate and normal heart sounds.  An irregular rhythm present.  Pulmonary/Chest: Effort normal and breath sounds normal. No stridor. No respiratory distress. He has no wheezes. He has no rales. He exhibits no tenderness.  Abdominal: He exhibits no distension. There is no tenderness. There is no rebound and no guarding.  Musculoskeletal: Normal range of motion. He exhibits edema. He exhibits no tenderness.  The right sided weakness since previous CVA-muscle strength is still 5/5. The right wrist and hand contractures-able to make fist and cannot extend fully. No longer ambulates with walker and w/c for mobility Trace pedal edema R>L Left sided weakness from the recent CVA-mild-grip strength 5/5  Lymphadenopathy:    He has no cervical adenopathy.  Neurological: He is alert and oriented to person, place, and time. He displays abnormal reflex. No cranial nerve deficit. Coordination normal.  New onset of the left sided decreased coordination-cannot transfer self independently and dropped his utensil, grip strength is still 5/5     Skin: Skin is warm and dry. No rash noted. He is not diaphoretic. No erythema. No pallor.  The right cholecystostomy surgical scar. The medial left lower leg surgical scar from previous melanoma removal.  Psychiatric: Thought content normal. His mood appears anxious. His affect is not angry and not inappropriate. His speech is not rapid and/or pressured, not delayed and not slurred. He is agitated. He is not aggressive, not hyperactive, not slowed and not withdrawn. Cognition and memory are impaired. He expresses impulsivity and inappropriate judgment. He does not exhibit a depressed mood. He exhibits abnormal recent memory. He exhibits normal remote memory.   (type .physexam) Filed Vitals:   02/25/14 1734  BP: 140/60  Pulse: 60  Temp: 98.2 F (36.8 C)  TempSrc: Tympanic  Resp: 18      Labs reviewed: Basic Metabolic Panel:  Recent Labs  06/29/13 1820  08/13/13 0425  09/13/13 02/01/14 02/02/14 1910 02/04/14 0534  NA  --   < > 137  < > 137 139 137 142  K  --   < > 3.3*  --  4.9 4.5 4.3 4.2  CL  --   < > 106  --   --   --  100 107  CO2  --   < > 24  --   --   --  22 25  GLUCOSE  --   < > 98  --   --   --  135* 90  BUN  --   < > 19  < > 19 21 29* 32*  CREATININE  --   < > 1.09  < > 1.0 1.1 1.22 1.31  CALCIUM  --   < > 8.2*  --   --   --  8.4 8.1*  MG 1.8  --   --   --   --   --   --   --   TSH  --   --   --   --  2.34  --   --   --   < > = values in this interval not displayed. Liver Function Tests:  Recent Labs  08/09/13 1749 08/10/13 0805 09/13/13 02/01/14 02/04/14 0534  AST 28 15 18  11* 143*  ALT 22 15 12  8* 194*  ALKPHOS 259* 194* 162* 96 234*  BILITOT 1.0 0.8  --   --  1.5*  PROT 6.6 5.2*  --   --  5.7*  ALBUMIN 2.5* 1.8*  --   --  2.5*    Recent Labs  06/28/13 2114 08/09/13 1805  LIPASE 18 13   CBC:  Recent Labs  06/28/13 2114  08/09/13 1749  08/13/13 0425  02/01/14 02/02/14 1910 02/04/14 0534  WBC 24.5*  < > 23.0*  < > 11.2*  < > 6.1 11.7*  12.6*  NEUTROABS 22.8*  --  20.3*  --   --   --   --  11.3*  --   HGB 11.2*  < > 12.7*  < > 11.3*  < > 10.8* 11.1* 10.1*  HCT 34.3*  < > 38.6*  < > 35.2*  < > 33* 34.4* 32.0*  MCV 89.1  < > 88.3  < > 91.0  --   --  90.5 92.0  PLT 345  < > 280  < > 311  < > 282 226 194  < > = values in this interval not displayed.  Past Procedures:  02/01/14 CXR mild cardiomegaly without pulmonary vascular congestion or pleural effusion, old granulomatous disease, no inflammatory consolidate or suspicious nodule, small patchy ares of atelectasis or non-consolidated pneumonia are seen at the lung bases.   2D Echocardiogram  - Left ventricle: Wall thickness was  increased in a pattern of mild LVH. Systolic function was normal. The estimated ejection fraction was in the range of 55% to 60%. - Aortic valve: There was mild stenosis.  - Left atrium: The atrium was moderately dilated. - Atrial septum: No defect or patent foramen ovale was identified.   MRI did in fact show Small acute left lateral medullary and left anterior frontal lobe (pre motor/Broca's area) infarcts. No associated mass effect. Petechial hemorrhage versus chronic micro hemorrhage in the left frontal lobe lesion, but no malignant hemorrhagic transformation. 2. Decreased flow or occlusion of the distal left vertebral artery corresponding to the lateral medullary infarct. Left MCA M1 and branch irregularity with no focal stenosis or major branch occlusion.   Assessment/Plan HYPERTENSION Controlled on Metoprolol.  Monitor Bp/P q shift.     GERD Takes Omeprazole 20mg  and Ranitidine 150mg  and Mylanta prn-stable.    CVA (cerebral vascular accident) Right sided weakness with right wrist/hand contracture. Grip strength is still 5/5. Ambulates with walker and w/c to go further. New onset of decreased the left sided coordination 01/2014.    Long term current use of anticoagulant Hx of CVA and Afib. Switched from Coumadin to Eliquis since the past  CVA 01/2014  Atrial fibrillation Atrial fibrillation remained rate controlled. Rate controlled. Patient was placed on Eliquis per neurology's recommendations. event. EKG normal sinus rhythm     Anemia of chronic disease Stable with Hgb 11.5 09/13/13 and 10.8 02/01/14   Unspecified constipation Stable with MiraLax qod and pnr Dulcolax suppository q12hr. Usually BM daily    Edema race pedal edema R>L-not apparent. Off TED. Continue furosemide . Observe the patient.    Dysphagia, unspecified(787.20) y discharge patient's strength was improved but not at back to baseline. Speech therapy consulted and recommended dysphagia 3 diet with nectar thickened liquids     Family/ Staff Communication: observe the patient.   Goals of Care: AL  Labs/tests ordered: none

## 2014-02-25 NOTE — Assessment & Plan Note (Signed)
Stable with MiraLax qod and pnr Dulcolax suppository q12hr. Usually BM daily  

## 2014-02-25 NOTE — Assessment & Plan Note (Signed)
Atrial fibrillation remained rate controlled. Rate controlled. Patient was placed on Eliquis per neurology's recommendations. event. EKG normal sinus rhythm

## 2014-02-25 NOTE — Assessment & Plan Note (Signed)
Hx of CVA and Afib. Switched from Coumadin to Eliquis since the past CVA 01/2014   

## 2014-02-25 NOTE — Assessment & Plan Note (Signed)
Right sided weakness with right wrist/hand contracture. Grip strength is still 5/5. Ambulates with walker and w/c to go further. New onset of decreased the left sided coordination 01/2014.

## 2014-02-25 NOTE — Assessment & Plan Note (Addendum)
race pedal edema R>L-not apparent. Off TED. Continue furosemide . Observe the patient.

## 2014-02-25 NOTE — Assessment & Plan Note (Signed)
y discharge patient's strength was improved but not at back to baseline. Speech therapy consulted and recommended dysphagia 3 diet with nectar thickened liquids

## 2014-02-25 NOTE — Assessment & Plan Note (Signed)
Controlled on Metoprolol.  Monitor Bp/P q shift.   

## 2014-02-25 NOTE — Assessment & Plan Note (Signed)
Takes Omeprazole 20mg  and Ranitidine 150mg  and Mylanta prn-stable.

## 2014-03-27 ENCOUNTER — Encounter: Payer: Self-pay | Admitting: Nurse Practitioner

## 2014-03-27 ENCOUNTER — Non-Acute Institutional Stay (SKILLED_NURSING_FACILITY): Payer: Medicare Other | Admitting: Nurse Practitioner

## 2014-03-27 DIAGNOSIS — M6281 Muscle weakness (generalized): Secondary | ICD-10-CM

## 2014-03-27 DIAGNOSIS — R1310 Dysphagia, unspecified: Secondary | ICD-10-CM

## 2014-03-27 DIAGNOSIS — R531 Weakness: Secondary | ICD-10-CM

## 2014-03-27 DIAGNOSIS — Z7901 Long term (current) use of anticoagulants: Secondary | ICD-10-CM

## 2014-03-27 DIAGNOSIS — I10 Essential (primary) hypertension: Secondary | ICD-10-CM

## 2014-03-27 DIAGNOSIS — K5900 Constipation, unspecified: Secondary | ICD-10-CM

## 2014-03-27 DIAGNOSIS — R609 Edema, unspecified: Secondary | ICD-10-CM

## 2014-03-27 DIAGNOSIS — K219 Gastro-esophageal reflux disease without esophagitis: Secondary | ICD-10-CM

## 2014-03-27 DIAGNOSIS — I4891 Unspecified atrial fibrillation: Secondary | ICD-10-CM

## 2014-03-27 DIAGNOSIS — D638 Anemia in other chronic diseases classified elsewhere: Secondary | ICD-10-CM

## 2014-03-27 NOTE — Assessment & Plan Note (Signed)
discharge patient's strength was improved but not at back to baseline. Speech therapy consulted and recommended dysphagia 3 diet with nectar thickened liquids

## 2014-03-27 NOTE — Assessment & Plan Note (Signed)
Stable with Hgb 11.5 09/13/13 and 10.8 02/01/14

## 2014-03-27 NOTE — Assessment & Plan Note (Signed)
Since the last CVA 02/2014. Muscle strength 5/5

## 2014-03-27 NOTE — Assessment & Plan Note (Signed)
Stable with MiraLax qod and pnr Dulcolax suppository q12hr. Usually BM daily

## 2014-03-27 NOTE — Assessment & Plan Note (Signed)
Atrial fibrillation remained rate controlled. Patient was placed on Eliquis per neurology's recommendations. event. EKG normal sinus rhythm  

## 2014-03-27 NOTE — Assessment & Plan Note (Signed)
x of CVA and Afib. Switched from Coumadin to Eliquis since the last CVA 01/2014

## 2014-03-27 NOTE — Assessment & Plan Note (Signed)
Takes Omeprazole 20mg    

## 2014-03-27 NOTE — Progress Notes (Signed)
Patient ID: [name], male   DOB: 1919/07/26, 80 y.o.   MRN: [id number]   Code Status: DNR  Allergies  Allergen Reactions  . Sulfonamide Derivatives Rash    REACTION: rash    Chief Complaint  Patient presents with  . Medical Management of Chronic Issues    HPI: Patient is a 80 y.o. male seen in the SNF at [facility] today for evaluation of chronic medical conditions.     Hospitalized from 02/02/2014 to 02/06/2014 for Acute ischemic stroke, embolicwith left upper extremity weakness and oral pharyngeal dysphagia.   Problem List Items Addressed This Visit   Anemia of chronic disease - Primary     Stable with Hgb 11.5 09/13/13 and 10.8 02/01/14      Atrial fibrillation     Atrial fibrillation remained rate controlled. Patient was placed on Eliquis per neurology's recommendations. event. EKG normal sinus rhythm     Dysphagia, unspecified(787.20)     discharge patient's strength was improved but not at back to baseline. Speech therapy consulted and recommended dysphagia 3 diet with nectar thickened liquids      Edema     trce pedal edema R>L-not apparent. Off TED. Continue furosemide . Observe the patient.      GERD     Takes Omeprazole 20mg      HYPERTENSION     Controlled on Metoprolol.  Monitor Bp/P q shift.       Left-sided weakness     Since the last CVA 02/2014. Muscle strength 5/5    Long term current use of anticoagulant     x of CVA and Afib. Switched from Coumadin to Eliquis since the last CVA 01/2014     Unspecified constipation     Stable with MiraLax qod and pnr Dulcolax suppository q12hr. Usually BM daily          Review of Systems:  Review of Systems  Constitutional: Positive for malaise/fatigue. Negative for fever, chills, weight loss and diaphoresis.       Generalized weakness and the right sided weakness from previous CVA. Worse-stay in bed today and very little oral intake reportedly-usually he is OOB daily.   HENT: Positive for  hearing loss. Negative for congestion, ear discharge, ear pain, nosebleeds, sore throat and tinnitus.   Eyes: Negative for blurred vision, double vision, photophobia, pain, discharge and redness.       Left eye blindness  Respiratory: Negative for cough, hemoptysis, sputum production, shortness of breath, wheezing and stridor.   Cardiovascular: Positive for leg swelling. Negative for chest pain, palpitations, orthopnea, claudication and PND.       Trace pedal edema R>L  Gastrointestinal: Negative for heartburn, nausea, vomiting, abdominal pain, diarrhea, constipation, blood in stool and melena.  Genitourinary: Negative for dysuria, urgency, frequency, hematuria and flank pain.  Musculoskeletal: Negative for back pain, falls, joint pain, myalgias and neck pain.  Skin: Negative for itching and rash.       Right cholecystostomy surgical scar. The medial left lower leg surgical scar from previous melanoma removal.   Neurological: Positive for dizziness, focal weakness and weakness. Negative for tingling, tremors, sensory change, speech change, seizures, loss of consciousness and headaches.       Right sided weakness from previous CVA even if grip strength is 5/5. The right wrist and hand contracture. Ambulates with walker and w/c to go further. C/o progressive weakness noted in his RUE. New onset of the left sided decreased coordination-cannot transfer self independently and dropped his utensil  Endo/Heme/Allergies: Negative for environmental allergies and polydipsia. Bruises/bleeds easily.  Psychiatric/Behavioral: Negative for depression, suicidal ideas, hallucinations, memory loss and substance abuse. The patient is not nervous/anxious and does not have insomnia.    Past Medical History  Diagnosis Date  . GERD (gastroesophageal reflux disease)   . Hypertension   . Hearing loss     wears hearing aid - right side  . Leg swelling   . Dysphagia   . Bruises easily     due to coumadin  . Weakness       difficulty walking  . Contact lens/glasses fitting   . Stroke 1999, 02/02/14  . Cancer   . 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 Gait disturbance   . COPD (chronic obstructive pulmonary disease)   . Peripheral vascular disease   . History of melanoma   . Diverticulosis   . Abnormality of gait 05/16/2013  . Choledocholithiasis with obstruction 06/28/13  . Embolic stroke 8/93/81  . PAF (paroxysmal atrial fibrillation)   . Hyperlipidemia   . Bradycardia   . Protein-calorie malnutrition, severe   . Ventricular tachycardia (paroxysmal)   . Obstructive sleep apnea of adult     uses cpap, pt does not know setting  . Personal history of fall 05/29/2013  . Fracture of femoral neck, right, closed 3114  . Long term (current) use of anticoagulants     PAF and embolic CVA  . Anemia, unspecified 07/05/2013  . Xerophthalmia 07/05/2013  . 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 Actinic keratosis   . Seborrheic keratosis   . Aortic valve stenosis   . Cerebrovascular disease   . Elevated liver enzymes   . Diverticulosis   . Herpes zoster 02/08/14    Left facial nerve distribution  . Right hemiparesis   . Melanoma 2009  . Hyperglycemia   . Xerophthalmia    Past Surgical History  Procedure Laterality Date  . Excision of melanoma  2009    left leg  . Melanoma excision      many melanoma removed in past  . Melanoma excision  10/11/2011    Procedure: MELANOMA EXCISION;  Surgeon: Pedro Earls, MD;  Location: [facility];  Service: General;  Laterality: Left;  EXCISION melanoma left leg with full thickness skin grafting from left lower abdomen.  . Joint replacement  2012    r fem head fx  . Lung benign area removed   1970's  . Hernia repair  1983  . Melanoma excision  05/16/2012    Procedure: MELANOMA EXCISION;  Surgeon: Pedro Earls, MD;  Location: [facility];  Service: General;  Laterality: Left;  Nodule Removal of Melanoma on Left Shin  . Ercp N/A 07/04/2013    Procedure: ENDOSCOPIC RETROGRADE  CHOLANGIOPANCREATOGRAPHY (ERCP);  Surgeon: Irene Shipper, MD;  Location: [facility];  Service: Endoscopy;  Laterality: N/A;   Social History:   reports that he quit smoking about 49 years ago. His smoking use included Pipe. He has never used smokeless tobacco. He reports that he does not drink alcohol or use illicit drugs.  Family History  Problem Relation Age of Onset  . Heart disease Father 68  . Cancer Son     prostate  . Dementia Sister     One sister has dementia    Medications: Patient's Medications  New Prescriptions   No medications on file  Previous Medications   ACETAMINOPHEN (TYLENOL) 325 MG TABLET    Take 650 mg by mouth every 6 (six) hours as  needed for pain.    ALUM & MAG HYDROXIDE-SIMETH (MINTOX) 621-308-65 MG/5ML SUSPENSION    Take 30 mLs by mouth every 6 (six) hours as needed for indigestion or heartburn.   APIXABAN (ELIQUIS) 5 MG TABS TABLET    Take 1 tablet (5 mg total) by mouth 2 (two) times daily.   BISACODYL (DULCOLAX) 10 MG SUPPOSITORY    Place 10 mg rectally every 12 (twelve) hours as needed for moderate constipation.   FUROSEMIDE (LASIX) 20 MG TABLET       HYDROXYPROPYL METHYLCELLULOSE (ISOPTO TEARS) 2.5 % OPHTHALMIC SOLUTION    Place 1 drop into both eyes 4 (four) times daily as needed (eye irritation).    MECLIZINE (ANTIVERT) 25 MG TABLET    Take 25 mg by mouth 3 (three) times daily as needed for dizziness.    METOPROLOL TARTRATE (LOPRESSOR) 25 MG TABLET    Take 25 mg by mouth 2 (two) times daily.   OMEPRAZOLE (PRILOSEC) 20 MG CAPSULE    Take 20 mg by mouth every morning.    POLYETHYLENE GLYCOL (MIRALAX / GLYCOLAX) PACKET    Take 17 g by mouth every other day.   WHITE PETROLATUM-MINERAL OIL (SYSTANE NIGHTTIME) OINT    Place 1 application into both eyes at bedtime.  Modified Medications   No medications on file  Discontinued Medications   CIPROFLOXACIN (CIPRO) 500 MG TABLET    Take 1 tablet (500 mg total) by mouth 2 (two) times daily. Through 3/14, then  stop     Physical Exam: Physical Exam  Constitutional: He is oriented to person, place, and time. He appears well-developed and well-nourished. No distress.  HENT:  Head: Normocephalic and atraumatic.  Right Ear: External ear normal.  Left Ear: External ear normal.  Nose: Nose normal.  Mouth/Throat: Oropharynx is clear and moist. No oropharyngeal exudate.  Eyes: Conjunctivae are normal. Pupils are equal, round, and reactive to light. Right eye exhibits no discharge. No scleral icterus.  Left eye blindness  Neck: Normal range of motion. Neck supple. No JVD present. No tracheal deviation present. No thyromegaly present.  Cardiovascular: Normal rate and normal heart sounds.  An irregular rhythm present.  Pulmonary/Chest: Effort normal and breath sounds normal. No stridor. No respiratory distress. He has no wheezes. He has no rales. He exhibits no tenderness.  Abdominal: He exhibits no distension. There is no tenderness. There is no rebound and no guarding.  Musculoskeletal: Normal range of motion. He exhibits edema. He exhibits no tenderness.  The right sided weakness since previous CVA-muscle strength is still 5/5. The right wrist and hand contractures-able to make fist and cannot extend fully. No longer ambulates with walker and w/c for mobility Trace pedal edema R>L Left sided weakness from the recent CVA-mild-grip strength 5/5  Lymphadenopathy:    He has no cervical adenopathy.  Neurological: He is alert and oriented to person, place, and time. He displays abnormal reflex. No cranial nerve deficit. Coordination normal.  New onset of the left sided decreased coordination-cannot transfer self independently and dropped his utensil, grip strength is still 5/5   Skin: Skin is warm and dry. No rash noted. He is not diaphoretic. No erythema. No pallor.  The right cholecystostomy surgical scar. The medial left lower leg surgical scar from previous melanoma removal.   Psychiatric: Thought content  normal. His mood appears anxious. His affect is not angry and not inappropriate. His speech is not rapid and/or pressured, not delayed and not slurred. He is agitated. He is not aggressive, not  hyperactive, not slowed and not withdrawn. Cognition and memory are impaired. He expresses impulsivity and inappropriate judgment. He does not exhibit a depressed mood. He exhibits abnormal recent memory. He exhibits normal remote memory.   (type .physexam) Filed Vitals:   03/27/14 1456  BP: 122/60  Pulse: 60  Temp: 98.2 F (36.8 C)  TempSrc: Tympanic  Resp: 16      Labs reviewed: Basic Metabolic Panel:  Recent Labs  06/29/13 1820  08/13/13 0425  09/13/13 02/01/14 02/02/14 1910 02/04/14 0534  NA  --   < > 137  < > 137 139 137 142  K  --   < > 3.3*  --  4.9 4.5 4.3 4.2  CL  --   < > 106  --   --   --  100 107  CO2  --   < > 24  --   --   --  22 25  GLUCOSE  --   < > 98  --   --   --  135* 90  BUN  --   < > 19  < > 19 21 29* 32*  CREATININE  --   < > 1.09  < > 1.0 1.1 1.22 1.31  CALCIUM  --   < > 8.2*  --   --   --  8.4 8.1*  MG 1.8  --   --   --   --   --   --   --   TSH  --   --   --   --  2.34  --   --   --   < > = values in this interval not displayed. Liver Function Tests:  Recent Labs  08/09/13 1749 08/10/13 0805 09/13/13 02/01/14 02/04/14 0534  AST 28 15 18  11* 143*  ALT 22 15 12  8* 194*  ALKPHOS 259* 194* 162* 96 234*  BILITOT 1.0 0.8  --   --  1.5*  PROT 6.6 5.2*  --   --  5.7*  ALBUMIN 2.5* 1.8*  --   --  2.5*    Recent Labs  06/28/13 2114 08/09/13 1805  LIPASE 18 13   CBC:  Recent Labs  06/28/13 2114  08/09/13 1749  08/13/13 0425  02/01/14 02/02/14 1910 02/04/14 0534  WBC 24.5*  < > 23.0*  < > 11.2*  < > 6.1 11.7* 12.6*  NEUTROABS 22.8*  --  20.3*  --   --   --   --  11.3*  --   HGB 11.2*  < > 12.7*  < > 11.3*  < > 10.8* 11.1* 10.1*  HCT 34.3*  < > 38.6*  < > 35.2*  < > 33* 34.4* 32.0*  MCV 89.1  < > 88.3  < > 91.0  --   --  90.5 92.0  PLT 345  < >  280  < > 311  < > 282 226 194  < > = values in this interval not displayed.  Past Procedures:  02/01/14 CXR mild cardiomegaly without pulmonary vascular congestion or pleural effusion, old granulomatous disease, no inflammatory consolidate or suspicious nodule, small patchy ares of atelectasis or non-consolidated pneumonia are seen at the lung bases.   2D Echocardiogram  - Left ventricle: Wall thickness was increased in a pattern of mild LVH. Systolic function was normal. The estimated ejection fraction was in the range of 55% to 60%. - Aortic valve: There was mild stenosis.  - Left atrium: The atrium  was moderately dilated. - Atrial septum: No defect or patent foramen ovale was identified.   MRI did in fact show Small acute left lateral medullary and left anterior frontal lobe (pre motor/Broca's area) infarcts. No associated mass effect. Petechial hemorrhage versus chronic micro hemorrhage in the left frontal lobe lesion, but no malignant hemorrhagic transformation. 2. Decreased flow or occlusion of the distal left vertebral artery corresponding to the lateral medullary infarct. Left MCA M1 and branch irregularity with no focal stenosis or major branch occlusion.   Assessment/Plan Anemia of chronic disease Stable with Hgb 11.5 09/13/13 and 10.8 02/01/14    Unspecified constipation Stable with MiraLax qod and pnr Dulcolax suppository q12hr. Usually BM daily     GERD Takes Omeprazole 20mg    Dysphagia, unspecified(787.20) discharge patient's strength was improved but not at back to baseline. Speech therapy consulted and recommended dysphagia 3 diet with nectar thickened liquids    Edema trce pedal edema R>L-not apparent. Off TED. Continue furosemide . Observe the patient.    HYPERTENSION Controlled on Metoprolol.  Monitor Bp/P q shift.     Atrial fibrillation Atrial fibrillation remained rate controlled. Patient was placed on Eliquis per neurology's recommendations. event. EKG  normal sinus rhythm   Left-sided weakness Since the last CVA 02/2014. Muscle strength 5/5  Long term current use of anticoagulant x of CVA and Afib. Switched from Coumadin to Eliquis since the last CVA 01/2014     Family/ Staff Communication: observe the patient.   Goals of Care: AL  Labs/tests ordered: none

## 2014-03-27 NOTE — Assessment & Plan Note (Signed)
Controlled on Metoprolol.  Monitor Bp/P q shift.

## 2014-03-27 NOTE — Assessment & Plan Note (Signed)
trce pedal edema R>L-not apparent. Off TED. Continue furosemide . Observe the patient.

## 2014-04-01 ENCOUNTER — Non-Acute Institutional Stay (SKILLED_NURSING_FACILITY): Payer: Medicare Other | Admitting: Nurse Practitioner

## 2014-04-01 ENCOUNTER — Encounter: Payer: Self-pay | Admitting: Nurse Practitioner

## 2014-04-01 DIAGNOSIS — K219 Gastro-esophageal reflux disease without esophagitis: Secondary | ICD-10-CM

## 2014-04-01 DIAGNOSIS — K5900 Constipation, unspecified: Secondary | ICD-10-CM

## 2014-04-01 DIAGNOSIS — I4891 Unspecified atrial fibrillation: Secondary | ICD-10-CM

## 2014-04-01 DIAGNOSIS — R609 Edema, unspecified: Secondary | ICD-10-CM

## 2014-04-01 DIAGNOSIS — L02239 Carbuncle of trunk, unspecified: Secondary | ICD-10-CM | POA: Insufficient documentation

## 2014-04-01 DIAGNOSIS — L02229 Furuncle of trunk, unspecified: Principal | ICD-10-CM

## 2014-04-01 NOTE — Assessment & Plan Note (Signed)
Stable with MiraLax qod and pnr Dulcolax suppository q12hr. Usually BM daily  

## 2014-04-01 NOTE — Assessment & Plan Note (Signed)
Takes Omeprazole 20mg    

## 2014-04-01 NOTE — Assessment & Plan Note (Signed)
Atrial fibrillation remained rate controlled. Patient was placed on Eliquis per neurology's recommendations. event. EKG normal sinus rhythm  

## 2014-04-01 NOTE — Assessment & Plan Note (Signed)
The right upper back-2 heads noted-drained. Will apply ABT ointment until healed. Doxycycline 100mg  bid for total 10 days started 03/01/14. Observe the patient.

## 2014-04-01 NOTE — Assessment & Plan Note (Addendum)
trace pedal edema R>L-not apparent. Off TED. Off furosemide . Observe the patient.

## 2014-04-01 NOTE — Progress Notes (Signed)
Patient ID: [name], male   DOB: [birth year], 80 y.o.   MRN: [id number]   Code Status: DNR  Allergies  Allergen Reactions  . Sulfonamide Derivatives Rash    REACTION: rash    Chief Complaint  Patient presents with  . Acute Visit    right upper back carbuncle.   . Medical Management of Chronic Issues    HPI: Patient is a 80 y.o. male seen in the SNF at [facility] today for evaluation of s/p right nose bleed 03/28/14, right upper back "sore", and chronic medical conditions.     Hospitalized from 02/02/2014 to 02/06/2014 for Acute ischemic stroke, embolicwith left upper extremity weakness and oral pharyngeal dysphagia.   Problem List Items Addressed This Visit   Atrial fibrillation     Atrial fibrillation remained rate controlled. Patient was placed on Eliquis per neurology's recommendations. event. EKG normal sinus rhythm      Carbuncle and furuncle of trunk - Primary     The right upper back-2 heads noted-drained. Will apply ABT ointment until healed. Doxycycline 100mg  bid for total 10 days started 03/01/14. Observe the patient.     Edema     trace pedal edema R>L-not apparent. Off TED. Off furosemide . Observe the patient.       GERD     Takes Omeprazole 20mg       Unspecified constipation     Stable with MiraLax qod and pnr Dulcolax suppository q12hr. Usually BM daily          Review of Systems:  Review of Systems  Constitutional: Negative for fever, chills, weight loss, malaise/fatigue and diaphoresis.       Generalized weakness and the right sided weakness from previous CVA. New left sided weakness from the recent CVA.   HENT: Positive for hearing loss and nosebleeds. Negative for congestion, ear discharge, ear pain, sore throat and tinnitus.        Stopped with cold compress 03/28/14  Eyes: Negative for blurred vision, double vision, photophobia, pain, discharge and redness.       Left eye blindness  Respiratory: Negative for cough, hemoptysis,  sputum production, shortness of breath, wheezing and stridor.   Cardiovascular: Positive for leg swelling. Negative for chest pain, palpitations, orthopnea, claudication and PND.       Trace pedal edema R>L  Gastrointestinal: Negative for heartburn, nausea, vomiting, abdominal pain, diarrhea, constipation, blood in stool and melena.  Genitourinary: Negative for dysuria, urgency, frequency, hematuria and flank pain.  Musculoskeletal: Negative for back pain, falls, joint pain, myalgias and neck pain.       W/c for mobility due to all limbs weakness from hx of CVA and recent new CVA.   Skin: Negative for itching and rash.       Right cholecystostomy surgical scar. The medial left lower leg surgical scar from previous melanoma removal. Right upper back carbuncle.   Neurological: Positive for dizziness, focal weakness and weakness. Negative for tingling, tremors, sensory change, speech change, seizures, loss of consciousness and headaches.       Right sided weakness from previous CVA even if grip strength is 5/5. The right wrist and hand contracture. Ambulates with walker and w/c to go further. C/o progressive weakness noted in his RUE. New onset of the left sided decreased coordination-cannot transfer self independently and dropped his utensil  Endo/Heme/Allergies: Negative for environmental allergies and polydipsia. Bruises/bleeds easily.  Psychiatric/Behavioral: Negative for depression, suicidal ideas, hallucinations, memory loss and substance abuse. The patient is not  nervous/anxious and does not have insomnia.    Past Medical History  Diagnosis Date  . GERD (gastroesophageal reflux disease)   . Hypertension   . Hearing loss     wears hearing aid - right side  . Leg swelling   . Dysphagia   . Bruises easily     due to coumadin  . Weakness     difficulty walking  . Contact lens/glasses fitting   . Stroke 1999, 02/02/14  . Cancer   . 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 Gait disturbance   . COPD  (chronic obstructive pulmonary disease)   . Peripheral vascular disease   . History of melanoma   . Diverticulosis   . Abnormality of gait 05/16/2013  . Choledocholithiasis with obstruction 06/28/13  . Embolic stroke [id number]  . PAF (paroxysmal atrial fibrillation)   . Hyperlipidemia   . Bradycardia   . Protein-calorie malnutrition, severe   . Ventricular tachycardia (paroxysmal)   . Obstructive sleep apnea of adult     uses cpap, pt does not know setting  . Personal history of fall 05/29/2013  . Fracture of femoral neck, right, closed 3114  . Long term (current) use of anticoagulants     PAF and embolic CVA  . Anemia, unspecified 07/05/2013  . Xerophthalmia 07/05/2013  . 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 Actinic keratosis   . Seborrheic keratosis   . Aortic valve stenosis   . Cerebrovascular disease   . Elevated liver enzymes   . Diverticulosis   . Herpes zoster 02/08/14    Left facial nerve distribution  . Right hemiparesis   . Melanoma 2009  . Hyperglycemia   . Xerophthalmia    Past Surgical History  Procedure Laterality Date  . Excision of melanoma  2009    left leg  . Melanoma excision      many melanoma removed in past  . Melanoma excision  10/11/2011    Procedure: MELANOMA EXCISION;  Surgeon: Pedro Earls, MD;  Location: [facility];  Service: General;  Laterality: Left;  EXCISION melanoma left leg with full thickness skin grafting from left lower abdomen.  . Joint replacement  2012    r fem head fx  . Lung benign area removed   1970's  . Hernia repair  1983  . Melanoma excision  05/16/2012    Procedure: MELANOMA EXCISION;  Surgeon: Pedro Earls, MD;  Location: [facility];  Service: General;  Laterality: Left;  Nodule Removal of Melanoma on Left Shin  . Ercp N/A 07/04/2013    Procedure: ENDOSCOPIC RETROGRADE CHOLANGIOPANCREATOGRAPHY (ERCP);  Surgeon: Irene Shipper, MD;  Location: [facility];  Service: Endoscopy;  Laterality: N/A;   Social History:   reports that he quit  smoking about 49 years ago. His smoking use included Pipe. He has never used smokeless tobacco. He reports that he does not drink alcohol or use illicit drugs.  Family History  Problem Relation Age of Onset  . Heart disease Father 5  . Cancer Son     prostate  . Dementia Sister     One sister has dementia    Medications: Patient's Medications  New Prescriptions   No medications on file  Previous Medications   ACETAMINOPHEN (TYLENOL) 325 MG TABLET    Take 650 mg by mouth every 6 (six) hours as needed for pain.    ALUM & MAG HYDROXIDE-SIMETH (MINTOX) [id number] MG/5ML SUSPENSION    Take 30 mLs by mouth every 6 (six) hours as  needed for indigestion or heartburn.   APIXABAN (ELIQUIS) 5 MG TABS TABLET    Take 1 tablet (5 mg total) by mouth 2 (two) times daily.   BISACODYL (DULCOLAX) 10 MG SUPPOSITORY    Place 10 mg rectally every 12 (twelve) hours as needed for moderate constipation.   HYDROXYPROPYL METHYLCELLULOSE (ISOPTO TEARS) 2.5 % OPHTHALMIC SOLUTION    Place 1 drop into both eyes 4 (four) times daily as needed (eye irritation).    MECLIZINE (ANTIVERT) 25 MG TABLET    Take 25 mg by mouth 3 (three) times daily as needed for dizziness.    METOPROLOL TARTRATE (LOPRESSOR) 25 MG TABLET    Take 25 mg by mouth 2 (two) times daily.   OMEPRAZOLE (PRILOSEC) 20 MG CAPSULE    Take 20 mg by mouth every morning.    POLYETHYLENE GLYCOL (MIRALAX / GLYCOLAX) PACKET    Take 17 g by mouth every other day.   WHITE PETROLATUM-MINERAL OIL (SYSTANE NIGHTTIME) OINT    Place 1 application into both eyes at bedtime.  Modified Medications   No medications on file  Discontinued Medications   FUROSEMIDE (LASIX) 20 MG TABLET         Physical Exam: Physical Exam  Constitutional: He is oriented to person, place, and time. He appears well-developed and well-nourished. No distress.  HENT:  Head: Normocephalic and atraumatic.  Right Ear: External ear normal.  Left Ear: External ear normal.  Nose: Nose normal.    Mouth/Throat: Oropharynx is clear and moist. No oropharyngeal exudate.  Eyes: Conjunctivae are normal. Pupils are equal, round, and reactive to light. Right eye exhibits no discharge. No scleral icterus.  Left eye blindness  Neck: Normal range of motion. Neck supple. No JVD present. No tracheal deviation present. No thyromegaly present.  Cardiovascular: Normal rate and normal heart sounds.  An irregular rhythm present.  Pulmonary/Chest: Effort normal and breath sounds normal. No stridor. No respiratory distress. He has no wheezes. He has no rales. He exhibits no tenderness.  Abdominal: He exhibits no distension. There is no tenderness. There is no rebound and no guarding.  Musculoskeletal: Normal range of motion. He exhibits edema. He exhibits no tenderness.  The right sided weakness since previous CVA-muscle strength is still 5/5. The right wrist and hand contractures-able to make fist and cannot extend fully. No longer ambulates with walker and w/c for mobility Trace pedal edema R>L Left sided weakness from the recent CVA-mild-grip strength 5/5  Lymphadenopathy:    He has no cervical adenopathy.  Neurological: He is alert and oriented to person, place, and time. He displays abnormal reflex. No cranial nerve deficit. Coordination normal.  New onset of the left sided decreased coordination-cannot transfer self independently and dropped his utensil, grip strength is still 5/5   Skin: Skin is warm and dry. No rash noted. He is not diaphoretic. No erythema. No pallor.  The right cholecystostomy surgical scar. The medial left lower leg surgical scar from previous melanoma removal. Right right upper back carbuncle with 2 heads opened and drained.    Psychiatric: Thought content normal. His mood appears anxious. His affect is not angry and not inappropriate. His speech is not rapid and/or pressured, not delayed and not slurred. He is agitated. He is not aggressive, not hyperactive, not slowed and not  withdrawn. Cognition and memory are impaired. He expresses impulsivity and inappropriate judgment. He does not exhibit a depressed mood. He exhibits abnormal recent memory. He exhibits normal remote memory.   (type .physexam) Filed Vitals:  04/01/14 1126  BP: 140/70  Pulse: 72  Temp: 97 F (36.1 C)  TempSrc: Tympanic  Resp: 18      Labs reviewed: Basic Metabolic Panel:  Recent Labs  06/29/13 1820  08/13/13 0425  09/13/13 02/01/14 02/02/14 1910 02/04/14 0534  NA  --   < > 137  < > 137 139 137 142  K  --   < > 3.3*  --  4.9 4.5 4.3 4.2  CL  --   < > 106  --   --   --  100 107  CO2  --   < > 24  --   --   --  22 25  GLUCOSE  --   < > 98  --   --   --  135* 90  BUN  --   < > 19  < > 19 21 29* 32*  CREATININE  --   < > 1.09  < > 1.0 1.1 1.22 1.31  CALCIUM  --   < > 8.2*  --   --   --  8.4 8.1*  MG 1.8  --   --   --   --   --   --   --   TSH  --   --   --   --  2.34  --   --   --   < > = values in this interval not displayed. Liver Function Tests:  Recent Labs  08/09/13 1749 08/10/13 0805 09/13/13 02/01/14 02/04/14 0534  AST 28 15 18  11* 143*  ALT 22 15 12  8* 194*  ALKPHOS 259* 194* 162* 96 234*  BILITOT 1.0 0.8  --   --  1.5*  PROT 6.6 5.2*  --   --  5.7*  ALBUMIN 2.5* 1.8*  --   --  2.5*    Recent Labs  06/28/13 2114 08/09/13 1805  LIPASE 18 13   CBC:  Recent Labs  06/28/13 2114  08/09/13 1749  08/13/13 0425  02/01/14 02/02/14 1910 02/04/14 0534  WBC 24.5*  < > 23.0*  < > 11.2*  < > 6.1 11.7* 12.6*  NEUTROABS 22.8*  --  20.3*  --   --   --   --  11.3*  --   HGB 11.2*  < > 12.7*  < > 11.3*  < > 10.8* 11.1* 10.1*  HCT 34.3*  < > 38.6*  < > 35.2*  < > 33* 34.4* 32.0*  MCV 89.1  < > 88.3  < > 91.0  --   --  90.5 92.0  PLT 345  < > 280  < > 311  < > 282 226 194  < > = values in this interval not displayed.  Past Procedures:  02/01/14 CXR mild cardiomegaly without pulmonary vascular congestion or pleural effusion, old granulomatous disease, no  inflammatory consolidate or suspicious nodule, small patchy ares of atelectasis or non-consolidated pneumonia are seen at the lung bases.   2D Echocardiogram  - Left ventricle: Wall thickness was increased in a pattern of mild LVH. Systolic function was normal. The estimated ejection fraction was in the range of 55% to 60%. - Aortic valve: There was mild stenosis.  - Left atrium: The atrium was moderately dilated. - Atrial septum: No defect or patent foramen ovale was identified.   MRI did in fact show Small acute left lateral medullary and left anterior frontal lobe (pre motor/Broca's area) infarcts. No associated mass effect. Petechial hemorrhage versus  chronic micro hemorrhage in the left frontal lobe lesion, but no malignant hemorrhagic transformation. 2. Decreased flow or occlusion of the distal left vertebral artery corresponding to the lateral medullary infarct. Left MCA M1 and branch irregularity with no focal stenosis or major branch occlusion.   Assessment/Plan Carbuncle and furuncle of trunk The right upper back-2 heads noted-drained. Will apply ABT ointment until healed. Doxycycline 100mg  bid for total 10 days started 03/01/14. Observe the patient.   Atrial fibrillation Atrial fibrillation remained rate controlled. Patient was placed on Eliquis per neurology's recommendations. event. EKG normal sinus rhythm    Edema trace pedal edema R>L-not apparent. Off TED. Off furosemide . Observe the patient.     Unspecified constipation Stable with MiraLax qod and pnr Dulcolax suppository q12hr. Usually BM daily     GERD Takes Omeprazole 20mg       Family/ Staff Communication: observe the patient.   Goals of Care: SNF  Labs/tests ordered: none

## 2014-04-04 ENCOUNTER — Encounter: Payer: Medicare Other | Admitting: Internal Medicine

## 2014-04-11 ENCOUNTER — Non-Acute Institutional Stay (SKILLED_NURSING_FACILITY): Payer: Medicare Other | Admitting: Internal Medicine

## 2014-04-11 DIAGNOSIS — I10 Essential (primary) hypertension: Secondary | ICD-10-CM

## 2014-04-11 DIAGNOSIS — I639 Cerebral infarction, unspecified: Secondary | ICD-10-CM

## 2014-04-11 DIAGNOSIS — L02239 Carbuncle of trunk, unspecified: Secondary | ICD-10-CM

## 2014-04-11 DIAGNOSIS — D638 Anemia in other chronic diseases classified elsewhere: Secondary | ICD-10-CM

## 2014-04-11 DIAGNOSIS — R1310 Dysphagia, unspecified: Secondary | ICD-10-CM

## 2014-04-11 DIAGNOSIS — I4891 Unspecified atrial fibrillation: Secondary | ICD-10-CM

## 2014-04-11 DIAGNOSIS — L02229 Furuncle of trunk, unspecified: Secondary | ICD-10-CM

## 2014-04-11 DIAGNOSIS — I6350 Cerebral infarction due to unspecified occlusion or stenosis of unspecified cerebral artery: Secondary | ICD-10-CM

## 2014-04-11 NOTE — Progress Notes (Signed)
Patient ID: [name], male   DOB: 1919/01/02, 80 y.o.   MRN: [id number]    Location:  [facility] Room 52  Place of Service: [facility]    Allergies  Allergen Reactions  . Sulfonamide Derivatives Rash    REACTION: rash    Chief Complaint  Patient presents with  . Medical Management of Chronic Issues    Anemia, dysphagia, history CVA, boil on back    HPI:  Routine visit was for this patient for followup of multiple medical issues.  Anemia of chronic disease: Patient had a hemoglobin of 10.8 with MCV 86.5 on 02/01/14.  Carbuncle and furuncle of trunk: Has been treated with doxycycline since 04/01/14. The boil is getting better.  HYPERTENSION: Controlled  CVA (cerebral vascular accident): Lingering tingling in the hands. Persistent weakness.  Atrial fibrillation: Chronic and rate control  Dysphagia, unspecified(787.20): Continues to have difficulty with swallowing. He is aware that he is aspirating from time to time.    Medications: Patient's Medications  New Prescriptions   No medications on file  Previous Medications   ACETAMINOPHEN (TYLENOL) 325 MG TABLET    Take 650 mg by mouth every 6 (six) hours as needed for pain.    ALUM & MAG HYDROXIDE-SIMETH (MINTOX) 573-220-25 MG/5ML SUSPENSION    Take 30 mLs by mouth every 6 (six) hours as needed for indigestion or heartburn.   APIXABAN (ELIQUIS) 5 MG TABS TABLET    Take 1 tablet (5 mg total) by mouth 2 (two) times daily.   BISACODYL (DULCOLAX) 10 MG SUPPOSITORY    Place 10 mg rectally every 12 (twelve) hours as needed for moderate constipation.   DOXYCYCLINE (VIBRAMYCIN) 100 MG CAPSULE       FUROSEMIDE (LASIX) 20 MG TABLET       HYDROXYPROPYL METHYLCELLULOSE (ISOPTO TEARS) 2.5 % OPHTHALMIC SOLUTION    Place 1 drop into both eyes 4 (four) times daily as needed (eye irritation).    MECLIZINE (ANTIVERT) 25 MG TABLET    Take 25 mg by mouth 3 (three) times daily as needed for dizziness.    METOPROLOL TARTRATE (LOPRESSOR) 25 MG TABLET     Take 25 mg by mouth 2 (two) times daily.   OMEPRAZOLE (PRILOSEC) 20 MG CAPSULE    Take 20 mg by mouth every morning.    POLYETHYLENE GLYCOL (MIRALAX / GLYCOLAX) PACKET    Take 17 g by mouth every other day.   WARFARIN (COUMADIN) 4 MG TABLET       WHITE PETROLATUM-MINERAL OIL (SYSTANE NIGHTTIME) OINT    Place 1 application into both eyes at bedtime.  Modified Medications   No medications on file  Discontinued Medications   No medications on file     Review of Systems  Constitutional: Positive for activity change, appetite change and fatigue. Negative for fever, chills and diaphoresis.  HENT: Positive for hearing loss. Negative for congestion, dental problem and drooling.   Eyes: Positive for visual disturbance (corrective lenses).       Xerophthalmia  Respiratory: Positive for cough and shortness of breath. Negative for wheezing.   Cardiovascular: Positive for palpitations. Negative for chest pain and leg swelling.  Gastrointestinal:       Dysphagia  Endocrine: Negative.   Genitourinary: Negative.   Musculoskeletal: Positive for arthralgias, back pain and gait problem.  Skin:       Herpetic lesions of the left face in the facial nerve distribution have resolved. There is no residual scarring or pain.  Allergic/Immunologic: Negative.   Neurological:  Right hemiparesis.  Hematological: Negative.   Psychiatric/Behavioral: Negative.     Filed Vitals:   04/11/14 1246  BP: 140/70  Pulse: 72  Temp: 97.1 F (36.2 C)  Resp: 18  Height: 5' 2"  (1.575 m)  Weight: 159 lb 4.8 oz (72.258 kg)   Physical Exam  Constitutional: He is oriented to person, place, and time. He appears well-developed and well-nourished. No distress.  HENT:  Head: Normocephalic and atraumatic.  Right Ear: External ear normal.  Left Ear: External ear normal.  Nose: Nose normal.  Mouth/Throat: Oropharynx is clear and moist. No oropharyngeal exudate.  Eyes: Conjunctivae are normal. Pupils are equal, round,  and reactive to light. Right eye exhibits no discharge. No scleral icterus.  Left eye blindness  Neck: Normal range of motion. Neck supple. No JVD present. No tracheal deviation present. No thyromegaly present.  Cardiovascular: Normal rate and normal heart sounds.  An irregular rhythm present.  Pulmonary/Chest: Effort normal and breath sounds normal. No stridor. No respiratory distress. He has no wheezes. He has no rales. He exhibits no tenderness.  Abdominal: He exhibits no distension. There is no tenderness. There is no rebound and no guarding.  Musculoskeletal: Normal range of motion. He exhibits edema. He exhibits no tenderness.  The right sided weakness since previous CVA-muscle strength is still 5/5. The right wrist and hand contractures-able to make fist and cannot extend fully. No longer ambulates with walker and w/c for mobility Trace pedal edema R>L Left sided weakness from the recent CVA-mild-grip strength 5/5  Lymphadenopathy:    He has no cervical adenopathy.  Neurological: He is alert and oriented to person, place, and time. He displays abnormal reflex. No cranial nerve deficit. Coordination normal.  Left sided decreased coordination-cannot transfer self independently and dropped his utensil, grip strength is still 5/5   Skin: Skin is warm and dry. No rash noted. He is not diaphoretic. No erythema. No pallor.  Right cholecystostomy surgical scar. The medial left lower leg surgical scar from previous melanoma removal. Right right upper back carbuncle.  Psychiatric: Thought content normal. His mood appears anxious. His affect is not angry and not inappropriate. His speech is not rapid and/or pressured, not delayed and not slurred. He is agitated. He is not aggressive, not hyperactive, not slowed and not withdrawn. Cognition and memory are impaired. He expresses impulsivity and inappropriate judgment. He does not exhibit a depressed mood. He exhibits abnormal recent memory. He exhibits  normal remote memory.     Labs reviewed: Admission on 02/02/2014, Discharged on 02/06/2014  Component Date Value Ref Range Status  . WBC 02/02/2014 11.7* 4.0 - 10.5 K/uL Final  . RBC 02/02/2014 3.80* 4.22 - 5.81 MIL/uL Final  . Hemoglobin 02/02/2014 11.1* 13.0 - 17.0 g/dL Final  . HCT 02/02/2014 34.4* 39.0 - 52.0 % Final  . MCV 02/02/2014 90.5  78.0 - 100.0 fL Final  . MCH 02/02/2014 29.2  26.0 - 34.0 pg Final  . MCHC 02/02/2014 32.3  30.0 - 36.0 g/dL Final  . RDW 02/02/2014 13.5  11.5 - 15.5 % Final  . Platelets 02/02/2014 226  150 - 400 K/uL Final  . Neutrophils Relative % 02/02/2014 96* 43 - 77 % Final  . Neutro Abs 02/02/2014 11.3* 1.7 - 7.7 K/uL Final  . Lymphocytes Relative 02/02/2014 2* 12 - 46 % Final  . Lymphs Abs 02/02/2014 0.2* 0.7 - 4.0 K/uL Final  . Monocytes Relative 02/02/2014 2* 3 - 12 % Final  . Monocytes Absolute 02/02/2014 0.3  0.1 -  1.0 K/uL Final  . Eosinophils Relative 02/02/2014 0  0 - 5 % Final  . Eosinophils Absolute 02/02/2014 0.0  0.0 - 0.7 K/uL Final  . Basophils Relative 02/02/2014 0  0 - 1 % Final  . Basophils Absolute 02/02/2014 0.0  0.0 - 0.1 K/uL Final  . Sodium 02/02/2014 137  137 - 147 mEq/L Final  . Potassium 02/02/2014 4.3  3.7 - 5.3 mEq/L Final  . Chloride 02/02/2014 100  96 - 112 mEq/L Final  . CO2 02/02/2014 22  19 - 32 mEq/L Final  . Glucose, Bld 02/02/2014 135* 70 - 99 mg/dL Final  . BUN 02/02/2014 29* 6 - 23 mg/dL Final  . Creatinine, Ser 02/02/2014 1.22  0.50 - 1.35 mg/dL Final  . Calcium 02/02/2014 8.4  8.4 - 10.5 mg/dL Final  . GFR calc non Af Amer 02/02/2014 50* >90 mL/min Final  . GFR calc Af Amer 02/02/2014 57* >90 mL/min Final   Comment: (NOTE)                          The eGFR has been calculated using the CKD EPI equation.                          This calculation has not been validated in all clinical situations.                          eGFR's persistently <90 mL/min signify possible Chronic Kidney                           Disease.  . Color, Urine 02/02/2014 AMBER* YELLOW Final   BIOCHEMICALS MAY BE AFFECTED BY COLOR  . APPearance 02/02/2014 CLEAR  CLEAR Final  . Specific Gravity, Urine 02/02/2014 1.021  1.005 - 1.030 Final  . pH 02/02/2014 7.0  5.0 - 8.0 Final  . Glucose, UA 02/02/2014 NEGATIVE  NEGATIVE mg/dL Final  . Hgb urine dipstick 02/02/2014 NEGATIVE  NEGATIVE Final  . Bilirubin Urine 02/02/2014 SMALL* NEGATIVE Final  . Ketones, ur 02/02/2014 NEGATIVE  NEGATIVE mg/dL Final  . Protein, ur 02/02/2014 NEGATIVE  NEGATIVE mg/dL Final  . Urobilinogen, UA 02/02/2014 4.0* 0.0 - 1.0 mg/dL Final  . Nitrite 02/02/2014 NEGATIVE  NEGATIVE Final  . Leukocytes, UA 02/02/2014 NEGATIVE  NEGATIVE Final   MICROSCOPIC NOT DONE ON URINES WITH NEGATIVE PROTEIN, BLOOD, LEUKOCYTES, NITRITE, OR GLUCOSE <1000 mg/dL.  Marland Kitchen Specimen Description 02/02/2014 BLOOD RIGHT HAND   Final  . Special Requests 02/02/2014 BOTTLES DRAWN AEROBIC AND ANAEROBIC 4CC   Final  . Culture  Setup Time 02/02/2014    Final                   Value:02/03/2014 02:58                         Performed at [facility]  . Culture 02/02/2014    Final                   Value:KLEBSIELLA PNEUMONIAE                         Note: SUSCEPTIBILITIES PERFORMED ON PREVIOUS CULTURE WITHIN THE LAST 5 DAYS.  Note: Gram Stain Report Called to,Read Back By and Verified With: A. TEPE 02/03/14 @ 12:20PM BY RUSCOE A.                         Performed at [facility]  . Report Status 02/02/2014 02/05/2014 FINAL   Final  . Specimen Description 02/02/2014 BLOOD RIGHT HAND   Final  . Special Requests 02/02/2014 BOTTLES DRAWN AEROBIC AND ANAEROBIC 4CC EACH   Final  . Culture  Setup Time 02/02/2014    Final                   Value:02/03/2014 02:58                         Performed at [facility]  . Culture 02/02/2014    Final                   Value:KLEBSIELLA PNEUMONIAE                         Note: Gram Stain Report Called to,Read  Back By and Verified With: A. TEPE 02/03/14 @ 12:20PM BY RUSCOE A.                         Performed at [facility]  . Report Status 02/02/2014 02/05/2014 FINAL   Final  . Organism ID, Bacteria 02/02/2014 KLEBSIELLA PNEUMONIAE   Final  . Prothrombin Time 02/02/2014 22.3* 11.6 - 15.2 seconds Final  . INR 02/02/2014 2.03* 0.00 - 1.49 Final  . Troponin I 02/02/2014 <0.30  <0.30 ng/mL Final   Comment:                                 Due to the release kinetics of cTnI,                          a negative result within the first hours                          of the onset of symptoms does not rule out                          myocardial infarction with certainty.                          If myocardial infarction is still suspected,                          repeat the test at appropriate intervals.  . Hemoglobin A1C 02/03/2014 5.6  <5.7 % Final   Comment: (NOTE)  According to the ADA Clinical Practice Recommendations for 2011, when                          HbA1c is used as a screening test:                           >=6.5%   Diagnostic of Diabetes Mellitus                                    (if abnormal result is confirmed)                          5.7-6.4%   Increased risk of developing Diabetes Mellitus                          References:Diagnosis and Classification of Diabetes Mellitus,Diabetes                          XNTZ,0017,49(SWHQP 1):S62-S69 and Standards of Medical Care in                                  Diabetes - 2011,Diabetes RFFM,3846,65 (Suppl 1):S11-S61.  . Mean Plasma Glucose 02/03/2014 114  <117 mg/dL Final   Performed at [facility]  . Cholesterol 02/03/2014 129  0 - 200 mg/dL Final  . Triglycerides 02/03/2014 99  <150 mg/dL Final  . HDL 02/03/2014 45  >39 mg/dL Final  . Total CHOL/HDL Ratio 02/03/2014 2.9   Final  . VLDL 02/03/2014 20   0 - 40 mg/dL Final  . LDL Cholesterol 02/03/2014 64  0 - 99 mg/dL Final   Comment:                                 Total Cholesterol/HDL:CHD Risk                          Coronary Heart Disease Risk Table                                              Men   Women                           1/2 Average Risk   3.4   3.3                           Average Risk       5.0   4.4                           2 X Average Risk   9.6   7.1                           3 X Average Risk  23.4   11.0  Use the calculated Patient Ratio                          above and the CHD Risk Table                          to determine the patient's CHD Risk.                                                          ATP III CLASSIFICATION (LDL):                           <100     mg/dL   Optimal                           100-129  mg/dL   Near or Above                                             Optimal                           130-159  mg/dL   Borderline                           160-189  mg/dL   High                           >190     mg/dL   Very High  . Prothrombin Time 02/03/2014 24.5* 11.6 - 15.2 seconds Final  . INR 02/03/2014 2.29* 0.00 - 1.49 Final  . Glucose-Capillary 02/03/2014 92  70 - 99 mg/dL Final  . Comment 1 02/03/2014 Documented in Chart   Final  . Comment 2 02/03/2014 Notify RN   Final  . MRSA by PCR 02/03/2014 NEGATIVE  NEGATIVE Final   Comment:                                 The GeneXpert MRSA Assay (FDA                          approved for NASAL specimens                          only), is one component of a                          comprehensive MRSA colonization                          surveillance program. It is not                          intended to diagnose MRSA  infection nor to guide or                          monitor treatment for                          MRSA infections.  . Glucose-Capillary  02/03/2014 88  70 - 99 mg/dL Final  . Glucose-Capillary 02/03/2014 87  70 - 99 mg/dL Final  . Prothrombin Time 02/04/2014 23.9* 11.6 - 15.2 seconds Final  . INR 02/04/2014 2.22* 0.00 - 1.49 Final  . WBC 02/04/2014 12.6* 4.0 - 10.5 K/uL Final  . RBC 02/04/2014 3.48* 4.22 - 5.81 MIL/uL Final  . Hemoglobin 02/04/2014 10.1* 13.0 - 17.0 g/dL Final  . HCT 02/04/2014 32.0* 39.0 - 52.0 % Final  . MCV 02/04/2014 92.0  78.0 - 100.0 fL Final  . MCH 02/04/2014 29.0  26.0 - 34.0 pg Final  . MCHC 02/04/2014 31.6  30.0 - 36.0 g/dL Final  . RDW 02/04/2014 14.1  11.5 - 15.5 % Final  . Platelets 02/04/2014 194  150 - 400 K/uL Final  . Sodium 02/04/2014 142  137 - 147 mEq/L Final  . Potassium 02/04/2014 4.2  3.7 - 5.3 mEq/L Final  . Chloride 02/04/2014 107  96 - 112 mEq/L Final  . CO2 02/04/2014 25  19 - 32 mEq/L Final  . Glucose, Bld 02/04/2014 90  70 - 99 mg/dL Final  . BUN 02/04/2014 32* 6 - 23 mg/dL Final  . Creatinine, Ser 02/04/2014 1.31  0.50 - 1.35 mg/dL Final  . Calcium 02/04/2014 8.1* 8.4 - 10.5 mg/dL Final  . Total Protein 02/04/2014 5.7* 6.0 - 8.3 g/dL Final  . Albumin 02/04/2014 2.5* 3.5 - 5.2 g/dL Final  . AST 02/04/2014 143* 0 - 37 U/L Final  . ALT 02/04/2014 194* 0 - 53 U/L Final  . Alkaline Phosphatase 02/04/2014 234* 39 - 117 U/L Final  . Total Bilirubin 02/04/2014 1.5* 0.3 - 1.2 mg/dL Final  . GFR calc non Af Amer 02/04/2014 46* >90 mL/min Final  . GFR calc Af Amer 02/04/2014 53* >90 mL/min Final   Comment: (NOTE)                          The eGFR has been calculated using the CKD EPI equation.                          This calculation has not been validated in all clinical situations.                          eGFR's persistently <90 mL/min signify possible Chronic Kidney                          Disease.  Marland Kitchen Specimen Description 02/04/2014 URINE, CATHETERIZED   Final  . Special Requests 02/04/2014 NONE   Final  . Culture  Setup Time 02/04/2014    Final                    Value:02/04/2014 19:04                         Performed at [facility]  . Colony Count 02/04/2014    Final  Value:NO GROWTH                         Performed at [facility]  . Culture 02/04/2014    Final                   Value:NO GROWTH                         Performed at [facility]  . Report Status 02/04/2014 02/05/2014 FINAL   Final  . Glucose-Capillary 02/03/2014 122* 70 - 99 mg/dL Final  . Glucose-Capillary 02/04/2014 91  70 - 99 mg/dL Final  . Glucose-Capillary 02/04/2014 109* 70 - 99 mg/dL Final  . Prothrombin Time 02/05/2014 19.5* 11.6 - 15.2 seconds Final  . INR 02/05/2014 1.70* 0.00 - 1.49 Final  . Glucose-Capillary 02/04/2014 96  70 - 99 mg/dL Final  . Glucose-Capillary 02/04/2014 128* 70 - 99 mg/dL Final  . Comment 1 02/04/2014 Notify RN   Final  . Glucose-Capillary 02/05/2014 104* 70 - 99 mg/dL Final  . Comment 1 02/05/2014 Notify RN   Final  . Glucose-Capillary 02/05/2014 127* 70 - 99 mg/dL Final  . Glucose-Capillary 02/05/2014 117* 70 - 99 mg/dL Final  . Prothrombin Time 02/06/2014 16.6* 11.6 - 15.2 seconds Final  . INR 02/06/2014 1.38  0.00 - 1.49 Final  . Glucose-Capillary 02/05/2014 126* 70 - 99 mg/dL Final  . Comment 1 02/05/2014 Notify RN   Final  . Comment 2 02/05/2014 Documented in Chart   Final  . Glucose-Capillary 02/06/2014 120* 70 - 99 mg/dL Final  . Comment 1 02/06/2014 Notify RN   Final  . Comment 2 02/06/2014 Documented in Chart   Final  . Glucose-Capillary 02/06/2014 119* 70 - 99 mg/dL Final  . Glucose-Capillary 02/06/2014 106* 70 - 99 mg/dL Final  Nursing Home on 02/01/2014  Component Date Value Ref Range Status  . INR 02/01/2014 1.4* 0.9 - 1.1 Final  . Hemoglobin 02/01/2014 10.8* 13.5 - 17.5 g/dL Final  . HCT 02/01/2014 33* 41 - 53 % Final  . Platelets 02/01/2014 282  150 - 399 K/L Final  . WBC 02/01/2014 6.1   Final  . Glucose 02/01/2014 94   Final  . BUN 02/01/2014 21  4 - 21 mg/dL Final  .  Creatinine 02/01/2014 1.1  0.6 - 1.3 mg/dL Final  . Potassium 02/01/2014 4.5  3.4 - 5.3 mmol/L Final  . Sodium 02/01/2014 139  137 - 147 mmol/L Final  . Alkaline Phosphatase 02/01/2014 96  25 - 125 U/L Final  . ALT 02/01/2014 8* 10 - 40 U/L Final  . AST 02/01/2014 11* 14 - 40 U/L Final  . Bilirubin, Total 02/01/2014 0.5   Final      Assessment/Plan  1. Anemia of chronic disease Followup CBC, reticulocyte count, serum iron, iron-binding capacity, and iron saturation  2. Carbuncle and furuncle of trunk Finish antibiotic  3. HYPERTENSION Controlled  4. CVA (cerebral vascular accident) Patient has had no further episodes of blacking out or suggestion of TIA or stroke. He does have residual weakness on the side and dysphagia felt to be secondary to the stroke  5. Atrial fibrillation Chronic. Rate controlled.  6. Dysphagia, unspecified(787.20) Continue to monitor and observe

## 2014-04-12 LAB — CBC AND DIFFERENTIAL
HCT: 36 % — AB (ref 41–53)
Hemoglobin: 12 g/dL — AB (ref 13.5–17.5)
Platelets: 268 10*3/uL (ref 150–399)
WBC: 7.8 10^3/mL

## 2014-04-12 LAB — BASIC METABOLIC PANEL
BUN: 32 mg/dL — AB (ref 4–21)
CREATININE: 1.1 mg/dL (ref 0.6–1.3)
Glucose: 84 mg/dL
POTASSIUM: 5.1 mmol/L (ref 3.4–5.3)
Sodium: 138 mmol/L (ref 137–147)

## 2014-04-12 LAB — HEPATIC FUNCTION PANEL
ALK PHOS: 105 U/L (ref 25–125)
ALT: 8 U/L — AB (ref 10–40)
AST: 12 U/L — AB (ref 14–40)
Bilirubin, Total: 0.5 mg/dL

## 2014-04-19 ENCOUNTER — Ambulatory Visit: Payer: Self-pay | Admitting: Nurse Practitioner

## 2014-05-01 ENCOUNTER — Non-Acute Institutional Stay (SKILLED_NURSING_FACILITY): Payer: Medicare Other | Admitting: Nurse Practitioner

## 2014-05-01 DIAGNOSIS — K5900 Constipation, unspecified: Secondary | ICD-10-CM

## 2014-05-01 DIAGNOSIS — L02229 Furuncle of trunk, unspecified: Secondary | ICD-10-CM

## 2014-05-01 DIAGNOSIS — R531 Weakness: Secondary | ICD-10-CM

## 2014-05-01 DIAGNOSIS — M6281 Muscle weakness (generalized): Secondary | ICD-10-CM

## 2014-05-01 DIAGNOSIS — D638 Anemia in other chronic diseases classified elsewhere: Secondary | ICD-10-CM

## 2014-05-01 DIAGNOSIS — I639 Cerebral infarction, unspecified: Secondary | ICD-10-CM

## 2014-05-01 DIAGNOSIS — I10 Essential (primary) hypertension: Secondary | ICD-10-CM

## 2014-05-01 DIAGNOSIS — I6350 Cerebral infarction due to unspecified occlusion or stenosis of unspecified cerebral artery: Secondary | ICD-10-CM

## 2014-05-01 DIAGNOSIS — K219 Gastro-esophageal reflux disease without esophagitis: Secondary | ICD-10-CM

## 2014-05-01 DIAGNOSIS — I4891 Unspecified atrial fibrillation: Secondary | ICD-10-CM

## 2014-05-01 DIAGNOSIS — L02239 Carbuncle of trunk, unspecified: Secondary | ICD-10-CM

## 2014-05-02 ENCOUNTER — Encounter: Payer: Self-pay | Admitting: Nurse Practitioner

## 2014-05-02 NOTE — Assessment & Plan Note (Signed)
Resolved. Hgb 12.0 04/12/14

## 2014-05-02 NOTE — Progress Notes (Signed)
Patient ID: [name], male   DOB: [birth year], 81 y.o.   MRN: [id number]   Code Status: DNR  Allergies  Allergen Reactions  . Sulfonamide Derivatives Rash    REACTION: rash    Chief Complaint  Patient presents with  . Medical Management of Chronic Issues    HPI: Patient is a 80 y.o. male seen in the SNF at [facility] today for evaluation of chronic medical conditions.     Hospitalized from 02/02/2014 to 02/06/2014 for Acute ischemic stroke, embolicwith left upper extremity weakness and oral pharyngeal dysphagia.   Problem List Items Addressed This Visit   Anemia of chronic disease     Resolved. Hgb 12.0 04/12/14    Atrial fibrillation     Atrial fibrillation remained rate controlled. Patient was placed on Eliquis per neurology's recommendations. event. EKG normal sinus rhythm     Carbuncle and furuncle of trunk     Healed.     CVA (cerebral vascular accident)     Right sided weakness with right wrist/hand contracture. Grip strength is still 5/5. Ambulates with walker and w/c to go further. New onset of decreased the left sided coordination 01/2014. C/o worsened in his right sided weakness and stiffness in his left side. PT to eval and treat as indicated.       GERD     Takes Omeprazole 20mg        HYPERTENSION - Primary     ontrolled on Metoprolol 25mg  bid.     Left-sided weakness     Since the last CVA 02/2014. Muscle strength 5/5     Unspecified constipation     Stable with MiraLax qod and pnr Dulcolax suppository q12hr. Usually BM daily           Review of Systems:  Review of Systems  Constitutional: Negative for fever, chills, weight loss, malaise/fatigue and diaphoresis.       Generalized weakness and the right sided weakness from previous CVA. New left sided weakness from the recent CVA.   HENT: Positive for hearing loss. Negative for congestion, ear discharge, ear pain, nosebleeds, sore throat and tinnitus.   Eyes: Negative for blurred  vision, double vision, photophobia, pain, discharge and redness.       Left eye blindness  Respiratory: Negative for cough, hemoptysis, sputum production, shortness of breath, wheezing and stridor.   Cardiovascular: Positive for leg swelling. Negative for chest pain, palpitations, orthopnea, claudication and PND.       Trace pedal edema R>L  Gastrointestinal: Negative for heartburn, nausea, vomiting, abdominal pain, diarrhea, constipation, blood in stool and melena.  Genitourinary: Negative for dysuria, urgency, frequency, hematuria and flank pain.  Musculoskeletal: Negative for back pain, falls, joint pain, myalgias and neck pain.       W/c for mobility due to all limbs weakness from hx of CVA and recent new CVA.   Skin: Negative for itching and rash.       Right cholecystostomy surgical scar. The medial left lower leg surgical scar from previous melanoma removal. Right upper back carbuncle-healed.   Neurological: Positive for focal weakness and weakness. Negative for dizziness, tingling, tremors, sensory change, speech change, seizures, loss of consciousness and headaches.       Right sided weakness from previous CVA even if grip strength is 5/5. The right wrist and hand contracture. Ambulates with walker and w/c to go further. C/o progressive weakness noted in his RUE. New onset of the left sided decreased coordination-cannot transfer self independently and  dropped his utensil  Endo/Heme/Allergies: Negative for environmental allergies and polydipsia. Bruises/bleeds easily.  Psychiatric/Behavioral: Negative for depression, suicidal ideas, hallucinations, memory loss and substance abuse. The patient is not nervous/anxious and does not have insomnia.    Past Medical History  Diagnosis Date  . GERD (gastroesophageal reflux disease)   . Hypertension   . Hearing loss     wears hearing aid - right side  . Leg swelling   . Dysphagia   . Bruises easily     due to coumadin  . Weakness      difficulty walking  . Contact lens/glasses fitting   . Stroke 1999, 02/02/14  . Cancer   . 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 Gait disturbance   . COPD (chronic obstructive pulmonary disease)   . Peripheral vascular disease   . History of melanoma   . Diverticulosis   . Abnormality of gait 05/16/2013  . Choledocholithiasis with obstruction 06/28/13  . Embolic stroke 03/14/80  . PAF (paroxysmal atrial fibrillation)   . Hyperlipidemia   . Bradycardia   . Protein-calorie malnutrition, severe   . Ventricular tachycardia (paroxysmal)   . Obstructive sleep apnea of adult     uses cpap, pt does not know setting  . Personal history of fall 05/29/2013  . Fracture of femoral neck, right, closed 3114  . Long term (current) use of anticoagulants     PAF and embolic CVA  . Anemia, unspecified 07/05/2013  . Xerophthalmia 07/05/2013  . 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 Actinic keratosis   . Seborrheic keratosis   . Aortic valve stenosis   . Cerebrovascular disease   . Elevated liver enzymes   . Diverticulosis   . Herpes zoster 02/08/14    Left facial nerve distribution  . Right hemiparesis   . Melanoma 2009  . Hyperglycemia   . Xerophthalmia    Past Surgical History  Procedure Laterality Date  . Excision of melanoma  2009    left leg  . Melanoma excision      many melanoma removed in past  . Melanoma excision  10/11/2011    Procedure: MELANOMA EXCISION;  Surgeon: Pedro Earls, MD;  Location: [place];  Service: General;  Laterality: Left;  EXCISION melanoma left leg with full thickness skin grafting from left lower abdomen.  . Joint replacement  2012    r fem head fx  . Lung benign area removed   1970's  . Hernia repair  1983  . Melanoma excision  05/16/2012    Procedure: MELANOMA EXCISION;  Surgeon: Pedro Earls, MD;  Location: [facility];  Service: General;  Laterality: Left;  Nodule Removal of Melanoma on Left Shin  . Ercp N/A 07/04/2013    Procedure: ENDOSCOPIC RETROGRADE CHOLANGIOPANCREATOGRAPHY  (ERCP);  Surgeon: Irene Shipper, MD;  Location: [facility];  Service: Endoscopy;  Laterality: N/A;   Social History:   reports that he quit smoking about 49 years ago. His smoking use included Pipe. He has never used smokeless tobacco. He reports that he does not drink alcohol or use illicit drugs.  Family History  Problem Relation Age of Onset  . Heart disease Father 25  . Cancer Son     prostate  . Dementia Sister     One sister has dementia    Medications: Patient's Medications  New Prescriptions   No medications on file  Previous Medications   ACETAMINOPHEN (TYLENOL) 325 MG TABLET    Take 650 mg by mouth every 6 (  six) hours as needed for pain.    ALUM & MAG HYDROXIDE-SIMETH (MINTOX) 749-449-67 MG/5ML SUSPENSION    Take 30 mLs by mouth every 6 (six) hours as needed for indigestion or heartburn.   APIXABAN (ELIQUIS) 5 MG TABS TABLET    Take 1 tablet (5 mg total) by mouth 2 (two) times daily.   BISACODYL (DULCOLAX) 10 MG SUPPOSITORY    Place 10 mg rectally every 12 (twelve) hours as needed for moderate constipation.   DOXYCYCLINE (VIBRAMYCIN) 100 MG CAPSULE       FUROSEMIDE (LASIX) 20 MG TABLET       HYDROXYPROPYL METHYLCELLULOSE (ISOPTO TEARS) 2.5 % OPHTHALMIC SOLUTION    Place 1 drop into both eyes 4 (four) times daily as needed (eye irritation).    MECLIZINE (ANTIVERT) 25 MG TABLET    Take 25 mg by mouth 3 (three) times daily as needed for dizziness.    METOPROLOL TARTRATE (LOPRESSOR) 25 MG TABLET    Take 25 mg by mouth 2 (two) times daily.   OMEPRAZOLE (PRILOSEC) 20 MG CAPSULE    Take 20 mg by mouth every morning.    POLYETHYLENE GLYCOL (MIRALAX / GLYCOLAX) PACKET    Take 17 g by mouth every other day.   WARFARIN (COUMADIN) 4 MG TABLET       WHITE PETROLATUM-MINERAL OIL (SYSTANE NIGHTTIME) OINT    Place 1 application into both eyes at bedtime.  Modified Medications   No medications on file  Discontinued Medications   No medications on file     Physical Exam: Physical  Exam  Constitutional: He is oriented to person, place, and time. He appears well-developed and well-nourished. No distress.  HENT:  Head: Normocephalic and atraumatic.  Right Ear: External ear normal.  Left Ear: External ear normal.  Nose: Nose normal.  Mouth/Throat: Oropharynx is clear and moist. No oropharyngeal exudate.  Eyes: Conjunctivae are normal. Pupils are equal, round, and reactive to light. Right eye exhibits no discharge. No scleral icterus.  Left eye blindness  Neck: Normal range of motion. Neck supple. No JVD present. No tracheal deviation present. No thyromegaly present.  Cardiovascular: Normal rate and normal heart sounds.  An irregular rhythm present.  Pulmonary/Chest: Effort normal and breath sounds normal. No stridor. No respiratory distress. He has no wheezes. He has no rales. He exhibits no tenderness.  Abdominal: He exhibits no distension. There is no tenderness. There is no rebound and no guarding.  Musculoskeletal: Normal range of motion. He exhibits edema. He exhibits no tenderness.  The right sided weakness since previous CVA-muscle strength is still 5/5. The right wrist and hand contractures-able to make fist and cannot extend fully. No longer ambulates with walker and w/c for mobility Trace pedal edema R>L Left sided weakness from the recent CVA-mild-grip strength 5/5  Lymphadenopathy:    He has no cervical adenopathy.  Neurological: He is alert and oriented to person, place, and time. He displays abnormal reflex. No cranial nerve deficit. Coordination normal.  New onset of the left sided decreased coordination-cannot transfer self independently and dropped his utensil, grip strength is still 5/5   Skin: Skin is warm and dry. No rash noted. He is not diaphoretic. No erythema. No pallor.  The right cholecystostomy surgical scar. The medial left lower leg surgical scar from previous melanoma removal. Right right upper back carbuncle-healed.   Psychiatric: Thought  content normal. His mood appears anxious. His affect is not angry and not inappropriate. His speech is not rapid and/or pressured, not delayed and  not slurred. He is agitated. He is not aggressive, not hyperactive, not slowed and not withdrawn. Cognition and memory are impaired. He expresses impulsivity and inappropriate judgment. He does not exhibit a depressed mood. He exhibits abnormal recent memory. He exhibits normal remote memory.   (type .physexam) Filed Vitals:   05/02/14 1330  BP: 130/72  Pulse: 86  Temp: 97.2 F (36.2 C)  TempSrc: Tympanic  Resp: 16      Labs reviewed: Basic Metabolic Panel:  Recent Labs  06/29/13 1820  08/13/13 0425 09/13/13  02/02/14 1910 02/04/14 0534 04/12/14  NA  --   < > 137 137  < > 137 142 138  K  --   < > 3.3* 4.9  < > 4.3 4.2 5.1  CL  --   < > 106  --   --  100 107  --   CO2  --   < > 24  --   --  22 25  --   GLUCOSE  --   < > 98  --   --  135* 90  --   BUN  --   < > 19 19  < > 29* 32* 32*  CREATININE  --   < > 1.09 1.0  < > 1.22 1.31 1.1  CALCIUM  --   < > 8.2*  --   --  8.4 8.1*  --   MG 1.8  --   --   --   --   --   --   --   TSH  --   --   --  2.34  --   --   --   --   < > = values in this interval not displayed. Liver Function Tests:  Recent Labs  08/09/13 1749 08/10/13 0805  02/01/14 02/04/14 0534 04/12/14  AST 28 15  < > 11* 143* 12*  ALT 22 15  < > 8* 194* 8*  ALKPHOS 259* 194*  < > 96 234* 105  BILITOT 1.0 0.8  --   --  1.5*  --   PROT 6.6 5.2*  --   --  5.7*  --   ALBUMIN 2.5* 1.8*  --   --  2.5*  --   < > = values in this interval not displayed.  Recent Labs  06/28/13 2114 08/09/13 1805  LIPASE 18 13   CBC:  Recent Labs  06/28/13 2114  08/09/13 1749  08/13/13 0425  02/02/14 1910 02/04/14 0534 04/12/14  WBC 24.5*  < > 23.0*  < > 11.2*  < > 11.7* 12.6* 7.8  NEUTROABS 22.8*  --  20.3*  --   --   --  11.3*  --   --   HGB 11.2*  < > 12.7*  < > 11.3*  < > 11.1* 10.1* 12.0*  HCT 34.3*  < > 38.6*  < > 35.2*  <  > 34.4* 32.0* 36*  MCV 89.1  < > 88.3  < > 91.0  --  90.5 92.0  --   PLT 345  < > 280  < > 311  < > 226 194 268  < > = values in this interval not displayed.  Past Procedures:  02/01/14 CXR mild cardiomegaly without pulmonary vascular congestion or pleural effusion, old granulomatous disease, no inflammatory consolidate or suspicious nodule, small patchy ares of atelectasis or non-consolidated pneumonia are seen at the lung bases.   2D Echocardiogram  - Left ventricle: Wall thickness  was increased in a pattern of mild LVH. Systolic function was normal. The estimated ejection fraction was in the range of 55% to 60%. - Aortic valve: There was mild stenosis.  - Left atrium: The atrium was moderately dilated. - Atrial septum: No defect or patent foramen ovale was identified.   MRI did in fact show Small acute left lateral medullary and left anterior frontal lobe (pre motor/Broca's area) infarcts. No associated mass effect. Petechial hemorrhage versus chronic micro hemorrhage in the left frontal lobe lesion, but no malignant hemorrhagic transformation. 2. Decreased flow or occlusion of the distal left vertebral artery corresponding to the lateral medullary infarct. Left MCA M1 and branch irregularity with no focal stenosis or major branch occlusion.   Assessment/Plan Anemia of chronic disease Resolved. Hgb 12.0 04/12/14  Atrial fibrillation Atrial fibrillation remained rate controlled. Patient was placed on Eliquis per neurology's recommendations. event. EKG normal sinus rhythm   Carbuncle and furuncle of trunk Healed.   Unspecified constipation Stable with MiraLax qod and pnr Dulcolax suppository q12hr. Usually BM daily      GERD Takes Omeprazole 20mg      HYPERTENSION ontrolled on Metoprolol 25mg  bid.   Left-sided weakness Since the last CVA 02/2014. Muscle strength 5/5   CVA (cerebral vascular accident) Right sided weakness with right wrist/hand contracture. Grip strength is  still 5/5. Ambulates with walker and w/c to go further. New onset of decreased the left sided coordination 01/2014. C/o worsened in his right sided weakness and stiffness in his left side. PT to eval and treat as indicated.       Family/ Staff Communication: observe the patient.   Goals of Care: SNF  Labs/tests ordered: none

## 2014-05-02 NOTE — Assessment & Plan Note (Signed)
Healed

## 2014-05-02 NOTE — Assessment & Plan Note (Signed)
Stable with MiraLax qod and pnr Dulcolax suppository q12hr. Usually BM daily  

## 2014-05-02 NOTE — Assessment & Plan Note (Signed)
ontrolled on Metoprolol 25mg  bid.

## 2014-05-02 NOTE — Assessment & Plan Note (Signed)
Since the last CVA 02/2014. Muscle strength 5/5 

## 2014-05-02 NOTE — Assessment & Plan Note (Signed)
Takes Omeprazole 20mg    

## 2014-05-02 NOTE — Assessment & Plan Note (Signed)
Atrial fibrillation remained rate controlled. Patient was placed on Eliquis per neurology's recommendations. event. EKG normal sinus rhythm  

## 2014-05-08 NOTE — Assessment & Plan Note (Signed)
Right sided weakness with right wrist/hand contracture. Grip strength is still 5/5. Ambulates with walker and w/c to go further. New onset of decreased the left sided coordination 01/2014. C/o worsened in his right sided weakness and stiffness in his left side. PT to eval and treat as indicated.

## 2014-05-28 ENCOUNTER — Ambulatory Visit: Payer: Medicare Other | Admitting: Nurse Practitioner

## 2014-06-03 ENCOUNTER — Non-Acute Institutional Stay (SKILLED_NURSING_FACILITY): Payer: Medicare Other | Admitting: Nurse Practitioner

## 2014-06-03 ENCOUNTER — Encounter: Payer: Self-pay | Admitting: Nurse Practitioner

## 2014-06-03 DIAGNOSIS — I639 Cerebral infarction, unspecified: Secondary | ICD-10-CM

## 2014-06-03 DIAGNOSIS — I4891 Unspecified atrial fibrillation: Secondary | ICD-10-CM

## 2014-06-03 DIAGNOSIS — I480 Paroxysmal atrial fibrillation: Secondary | ICD-10-CM

## 2014-06-03 DIAGNOSIS — Z7901 Long term (current) use of anticoagulants: Secondary | ICD-10-CM

## 2014-06-03 DIAGNOSIS — I10 Essential (primary) hypertension: Secondary | ICD-10-CM

## 2014-06-03 DIAGNOSIS — K219 Gastro-esophageal reflux disease without esophagitis: Secondary | ICD-10-CM

## 2014-06-03 DIAGNOSIS — I6350 Cerebral infarction due to unspecified occlusion or stenosis of unspecified cerebral artery: Secondary | ICD-10-CM

## 2014-06-03 NOTE — Assessment & Plan Note (Signed)
Hx of CVA and Afib. Switched from Coumadin to Eliquis since the past CVA 01/2014

## 2014-06-03 NOTE — Progress Notes (Signed)
Patient ID: [name], male   DOB: 12-10-1918, 80 y.o.   MRN: [id number]   Code Status: DNR  Allergies  Allergen Reactions  . Sulfonamide Derivatives Rash    REACTION: rash    Chief Complaint  Patient presents with  . Medical Management of Chronic Issues    HPI: Patient is a 80 y.o. male seen in the SNF at [facility] today for evaluation of chronic medical conditions.     Hospitalized from 02/02/2014 to 02/06/2014 for Acute ischemic stroke, embolicwith left upper extremity weakness and oral pharyngeal dysphagia.   Problem List Items Addressed This Visit   HYPERTENSION     controlled on Metoprolol 25mg  bid.      GERD - Primary     Stable. Takes Omeprazole 20mg       CVA (cerebral vascular accident)     Right sided weakness with right wrist/hand contracture. Grip strength is still 5/5. Ambulates with walker and w/c to go further. New onset of decreased the left sided coordination 01/2014. C/o worsened in his right sided weakness and stiffness in his left side. Continue PT      Long term current use of anticoagulant     Hx of CVA and Afib. Switched from Coumadin to Eliquis since the past CVA 01/2014     Atrial fibrillation     Atrial fibrillation remained rate controlled. Patient was placed on Eliquis per neurology's recommendations. event. EKG normal sinus rhythm         Review of Systems:  Review of Systems  Constitutional: Negative for fever, chills, weight loss, malaise/fatigue and diaphoresis.       Generalized weakness and the right sided weakness from previous CVA. New left sided weakness from the recent CVA.   HENT: Positive for hearing loss. Negative for congestion, ear discharge, ear pain, nosebleeds, sore throat and tinnitus.   Eyes: Negative for blurred vision, double vision, photophobia, pain, discharge and redness.       Left eye blindness  Respiratory: Negative for cough, hemoptysis, sputum production, shortness of breath, wheezing and stridor.    Cardiovascular: Positive for leg swelling. Negative for chest pain, palpitations, orthopnea, claudication and PND.       Trace pedal edema R>L  Gastrointestinal: Negative for heartburn, nausea, vomiting, abdominal pain, diarrhea, constipation, blood in stool and melena.  Genitourinary: Negative for dysuria, urgency, frequency, hematuria and flank pain.  Musculoskeletal: Negative for back pain, falls, joint pain, myalgias and neck pain.       W/c for mobility due to all limbs weakness from hx of CVA and recent new CVA.   Skin: Negative for itching and rash.       Right cholecystostomy surgical scar. The medial left lower leg surgical scar from previous melanoma removal. Right upper back carbuncle-healed.   Neurological: Positive for focal weakness and weakness. Negative for dizziness, tingling, tremors, sensory change, speech change, seizures, loss of consciousness and headaches.       Right sided weakness from previous CVA even if grip strength is 5/5. The right wrist and hand contracture. Ambulates with walker and w/c to go further. C/o progressive weakness noted in his RUE. New onset of the left sided decreased coordination-cannot transfer self independently and dropped his utensil  Endo/Heme/Allergies: Negative for environmental allergies and polydipsia. Bruises/bleeds easily.  Psychiatric/Behavioral: Negative for depression, suicidal ideas, hallucinations, memory loss and substance abuse. The patient is not nervous/anxious and does not have insomnia.    Past Medical History  Diagnosis Date  .  GERD (gastroesophageal reflux disease)   . Hypertension   . Hearing loss     wears hearing aid - right side  . Leg swelling   . Dysphagia   . Bruises easily     due to coumadin  . Weakness     difficulty walking  . Contact lens/glasses fitting   . Stroke 1999, 02/02/14  . Cancer   . 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 Gait disturbance   . COPD (chronic obstructive pulmonary disease)   . Peripheral  vascular disease   . History of melanoma   . Diverticulosis   . Abnormality of gait 05/16/2013  . Choledocholithiasis with obstruction 06/28/13  . Embolic stroke 4/69/62  . PAF (paroxysmal atrial fibrillation)   . Hyperlipidemia   . Bradycardia   . Protein-calorie malnutrition, severe   . Ventricular tachycardia (paroxysmal)   . Obstructive sleep apnea of adult     uses cpap, pt does not know setting  . Personal history of fall 05/29/2013  . Fracture of femoral neck, right, closed 3114  . Long term (current) use of anticoagulants     PAF and embolic CVA  . Anemia, unspecified 07/05/2013  . Xerophthalmia 07/05/2013  . 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 Actinic keratosis   . Seborrheic keratosis   . Aortic valve stenosis   . Cerebrovascular disease   . Elevated liver enzymes   . Diverticulosis   . Herpes zoster 02/08/14    Left facial nerve distribution  . Right hemiparesis   . Melanoma 2009  . Hyperglycemia   . Xerophthalmia    Past Surgical History  Procedure Laterality Date  . Excision of melanoma  2009    left leg  . Melanoma excision      many melanoma removed in past  . Melanoma excision  10/11/2011    Procedure: MELANOMA EXCISION;  Surgeon: Pedro Earls, MD;  Location: [facility];  Service: General;  Laterality: Left;  EXCISION melanoma left leg with full thickness skin grafting from left lower abdomen.  . Joint replacement  2012    r fem head fx  . Lung benign area removed   1970's  . Hernia repair  1983  . Melanoma excision  05/16/2012    Procedure: MELANOMA EXCISION;  Surgeon: Pedro Earls, MD;  Location: [facility];  Service: General;  Laterality: Left;  Nodule Removal of Melanoma on Left Shin  . Ercp N/A 07/04/2013    Procedure: ENDOSCOPIC RETROGRADE CHOLANGIOPANCREATOGRAPHY (ERCP);  Surgeon: Irene Shipper, MD;  Location: [facility];  Service: Endoscopy;  Laterality: N/A;   Social History:   reports that he quit smoking about 49 years ago. His smoking use included Pipe.  He has never used smokeless tobacco. He reports that he does not drink alcohol or use illicit drugs.  Family History  Problem Relation Age of Onset  . Heart disease Father 71  . Cancer Son     prostate  . Dementia Sister     One sister has dementia    Medications: Patient's Medications  New Prescriptions   No medications on file  Previous Medications   ACETAMINOPHEN (TYLENOL) 325 MG TABLET    Take 650 mg by mouth every 6 (six) hours as needed for pain.    ALUM & MAG HYDROXIDE-SIMETH (MINTOX) 952-841-32 MG/5ML SUSPENSION    Take 30 mLs by mouth every 6 (six) hours as needed for indigestion or heartburn.   APIXABAN (ELIQUIS) 5 MG TABS TABLET    Take  1 tablet (5 mg total) by mouth 2 (two) times daily.   BISACODYL (DULCOLAX) 10 MG SUPPOSITORY    Place 10 mg rectally every 12 (twelve) hours as needed for moderate constipation.   DOXYCYCLINE (VIBRAMYCIN) 100 MG CAPSULE       FUROSEMIDE (LASIX) 20 MG TABLET       HYDROXYPROPYL METHYLCELLULOSE (ISOPTO TEARS) 2.5 % OPHTHALMIC SOLUTION    Place 1 drop into both eyes 4 (four) times daily as needed (eye irritation).    MECLIZINE (ANTIVERT) 25 MG TABLET    Take 25 mg by mouth 3 (three) times daily as needed for dizziness.    METOPROLOL TARTRATE (LOPRESSOR) 25 MG TABLET    Take 25 mg by mouth 2 (two) times daily.   OMEPRAZOLE (PRILOSEC) 20 MG CAPSULE    Take 20 mg by mouth every morning.    POLYETHYLENE GLYCOL (MIRALAX / GLYCOLAX) PACKET    Take 17 g by mouth every other day.   WARFARIN (COUMADIN) 4 MG TABLET       WHITE PETROLATUM-MINERAL OIL (SYSTANE NIGHTTIME) OINT    Place 1 application into both eyes at bedtime.  Modified Medications   No medications on file  Discontinued Medications   No medications on file     Physical Exam: Physical Exam  Constitutional: He is oriented to person, place, and time. He appears well-developed and well-nourished. No distress.  HENT:  Head: Normocephalic and atraumatic.  Right Ear: External ear normal.    Left Ear: External ear normal.  Nose: Nose normal.  Mouth/Throat: Oropharynx is clear and moist. No oropharyngeal exudate.  Eyes: Conjunctivae are normal. Pupils are equal, round, and reactive to light. Right eye exhibits no discharge. No scleral icterus.  Left eye blindness  Neck: Normal range of motion. Neck supple. No JVD present. No tracheal deviation present. No thyromegaly present.  Cardiovascular: Normal rate and normal heart sounds.  An irregular rhythm present.  Pulmonary/Chest: Effort normal and breath sounds normal. No stridor. No respiratory distress. He has no wheezes. He has no rales. He exhibits no tenderness.  Abdominal: He exhibits no distension. There is no tenderness. There is no rebound and no guarding.  Musculoskeletal: Normal range of motion. He exhibits edema. He exhibits no tenderness.  The right sided weakness since previous CVA-muscle strength is still 5/5. The right wrist and hand contractures-able to make fist and cannot extend fully. No longer ambulates with walker and w/c for mobility Trace pedal edema R>L Left sided weakness from the recent CVA-mild-grip strength 5/5  Lymphadenopathy:    He has no cervical adenopathy.  Neurological: He is alert and oriented to person, place, and time. He displays abnormal reflex. No cranial nerve deficit. Coordination normal.  New onset of the left sided decreased coordination-cannot transfer self independently and dropped his utensil, grip strength is still 5/5   Skin: Skin is warm and dry. No rash noted. He is not diaphoretic. No erythema. No pallor.  The right cholecystostomy surgical scar. The medial left lower leg surgical scar from previous melanoma removal. Right right upper back carbuncle-healed.   Psychiatric: Thought content normal. His mood appears anxious. His affect is not angry and not inappropriate. His speech is not rapid and/or pressured, not delayed and not slurred. He is agitated. He is not aggressive, not  hyperactive, not slowed and not withdrawn. Cognition and memory are impaired. He expresses impulsivity and inappropriate judgment. He does not exhibit a depressed mood. He exhibits abnormal recent memory. He exhibits normal remote memory.   (  type .physexam) Filed Vitals:   06/03/14 1642  BP: 126/66  Pulse: 63  Temp: 97 F (36.1 C)  TempSrc: Tympanic  Resp: 18      Labs reviewed: Basic Metabolic Panel:  Recent Labs  06/29/13 1820  08/13/13 0425 09/13/13  02/02/14 1910 02/04/14 0534 04/12/14  NA  --   < > 137 137  < > 137 142 138  K  --   < > 3.3* 4.9  < > 4.3 4.2 5.1  CL  --   < > 106  --   --  100 107  --   CO2  --   < > 24  --   --  22 25  --   GLUCOSE  --   < > 98  --   --  135* 90  --   BUN  --   < > 19 19  < > 29* 32* 32*  CREATININE  --   < > 1.09 1.0  < > 1.22 1.31 1.1  CALCIUM  --   < > 8.2*  --   --  8.4 8.1*  --   MG 1.8  --   --   --   --   --   --   --   TSH  --   --   --  2.34  --   --   --   --   < > = values in this interval not displayed. Liver Function Tests:  Recent Labs  08/09/13 1749 08/10/13 0805  02/01/14 02/04/14 0534 04/12/14  AST 28 15  < > 11* 143* 12*  ALT 22 15  < > 8* 194* 8*  ALKPHOS 259* 194*  < > 96 234* 105  BILITOT 1.0 0.8  --   --  1.5*  --   PROT 6.6 5.2*  --   --  5.7*  --   ALBUMIN 2.5* 1.8*  --   --  2.5*  --   < > = values in this interval not displayed.  Recent Labs  06/28/13 2114 08/09/13 1805  LIPASE 18 13   CBC:  Recent Labs  06/28/13 2114  08/09/13 1749  08/13/13 0425  02/02/14 1910 02/04/14 0534 04/12/14  WBC 24.5*  < > 23.0*  < > 11.2*  < > 11.7* 12.6* 7.8  NEUTROABS 22.8*  --  20.3*  --   --   --  11.3*  --   --   HGB 11.2*  < > 12.7*  < > 11.3*  < > 11.1* 10.1* 12.0*  HCT 34.3*  < > 38.6*  < > 35.2*  < > 34.4* 32.0* 36*  MCV 89.1  < > 88.3  < > 91.0  --  90.5 92.0  --   PLT 345  < > 280  < > 311  < > 226 194 268  < > = values in this interval not displayed.  Past Procedures:  02/01/14 CXR mild  cardiomegaly without pulmonary vascular congestion or pleural effusion, old granulomatous disease, no inflammatory consolidate or suspicious nodule, small patchy ares of atelectasis or non-consolidated pneumonia are seen at the lung bases.   2D Echocardiogram  - Left ventricle: Wall thickness was increased in a pattern of mild LVH. Systolic function was normal. The estimated ejection fraction was in the range of 55% to 60%. - Aortic valve: There was mild stenosis.  - Left atrium: The atrium was moderately dilated. - Atrial septum: No defect or  patent foramen ovale was identified.   MRI did in fact show Small acute left lateral medullary and left anterior frontal lobe (pre motor/Broca's area) infarcts. No associated mass effect. Petechial hemorrhage versus chronic micro hemorrhage in the left frontal lobe lesion, but no malignant hemorrhagic transformation. 2. Decreased flow or occlusion of the distal left vertebral artery corresponding to the lateral medullary infarct. Left MCA M1 and branch irregularity with no focal stenosis or major branch occlusion.   Assessment/Plan GERD Stable. Takes Omeprazole 20mg     CVA (cerebral vascular accident) Right sided weakness with right wrist/hand contracture. Grip strength is still 5/5. Ambulates with walker and w/c to go further. New onset of decreased the left sided coordination 01/2014. C/o worsened in his right sided weakness and stiffness in his left side. Continue PT    Long term current use of anticoagulant Hx of CVA and Afib. Switched from Coumadin to Eliquis since the past CVA 01/2014   Atrial fibrillation Atrial fibrillation remained rate controlled. Patient was placed on Eliquis per neurology's recommendations. event. EKG normal sinus rhythm    HYPERTENSION controlled on Metoprolol 25mg  bid.      Family/ Staff Communication: observe the patient.   Goals of Care: SNF  Labs/tests ordered: none

## 2014-06-03 NOTE — Assessment & Plan Note (Signed)
controlled on Metoprolol 25mg  bid.

## 2014-06-03 NOTE — Assessment & Plan Note (Signed)
Right sided weakness with right wrist/hand contracture. Grip strength is still 5/5. Ambulates with walker and w/c to go further. New onset of decreased the left sided coordination 01/2014. C/o worsened in his right sided weakness and stiffness in his left side. Continue PT

## 2014-06-03 NOTE — Assessment & Plan Note (Signed)
Atrial fibrillation remained rate controlled. Patient was placed on Eliquis per neurology's recommendations. event. EKG normal sinus rhythm  

## 2014-06-03 NOTE — Assessment & Plan Note (Signed)
Stable. Takes Omeprazole 20mg 

## 2014-07-03 ENCOUNTER — Encounter: Payer: Self-pay | Admitting: Nurse Practitioner

## 2014-07-03 ENCOUNTER — Non-Acute Institutional Stay (SKILLED_NURSING_FACILITY): Payer: Medicare Other | Admitting: Nurse Practitioner

## 2014-07-03 DIAGNOSIS — I6350 Cerebral infarction due to unspecified occlusion or stenosis of unspecified cerebral artery: Secondary | ICD-10-CM

## 2014-07-03 DIAGNOSIS — H02109 Unspecified ectropion of unspecified eye, unspecified eyelid: Secondary | ICD-10-CM

## 2014-07-03 DIAGNOSIS — K5900 Constipation, unspecified: Secondary | ICD-10-CM

## 2014-07-03 DIAGNOSIS — K219 Gastro-esophageal reflux disease without esophagitis: Secondary | ICD-10-CM

## 2014-07-03 DIAGNOSIS — M19049 Primary osteoarthritis, unspecified hand: Secondary | ICD-10-CM

## 2014-07-03 DIAGNOSIS — I480 Paroxysmal atrial fibrillation: Secondary | ICD-10-CM

## 2014-07-03 DIAGNOSIS — I10 Essential (primary) hypertension: Secondary | ICD-10-CM

## 2014-07-03 DIAGNOSIS — M19042 Primary osteoarthritis, left hand: Secondary | ICD-10-CM

## 2014-07-03 DIAGNOSIS — H02105 Unspecified ectropion of left lower eyelid: Secondary | ICD-10-CM

## 2014-07-03 DIAGNOSIS — I639 Cerebral infarction, unspecified: Secondary | ICD-10-CM

## 2014-07-03 DIAGNOSIS — I4891 Unspecified atrial fibrillation: Secondary | ICD-10-CM

## 2014-07-03 NOTE — Assessment & Plan Note (Signed)
Takes Omeprazole 20mg 

## 2014-07-03 NOTE — Assessment & Plan Note (Signed)
Atrial fibrillation remained rate controlled. Patient was placed on Eliquis per neurology's recommendations. event. EKG normal sinus rhythm  

## 2014-07-03 NOTE — Assessment & Plan Note (Signed)
Right sided weakness with right wrist/hand contracture. Grip strength is still 5/5. Ambulates with walker and w/c to go further. New onset of decreased the left sided coordination 01/2014. C/o worsened in his right sided weakness and stiffness in his left side. Continue PT

## 2014-07-03 NOTE — Assessment & Plan Note (Signed)
Stable, takes prn Dulcolax suppository q12hr. Usually BM daily

## 2014-07-03 NOTE — Assessment & Plan Note (Signed)
Aches and stiffness. No erythema, heat, or swelling. Will apply MyoFlex cram qid-observe.

## 2014-07-03 NOTE — Assessment & Plan Note (Signed)
Chronic irritation-will see Ophthalmology in a few weeks.

## 2014-07-03 NOTE — Assessment & Plan Note (Signed)
Controlled, takes Metoprolol 25mg bid.   

## 2014-07-03 NOTE — Progress Notes (Signed)
Patient ID: [name], male   DOB: Dec 25, 1918, 80 y.o.   MRN: [id number]   Code Status: DNR  Allergies  Allergen Reactions  . Sulfonamide Derivatives Rash    REACTION: rash    Chief Complaint  Patient presents with  . Medical Management of Chronic Issues    HPI: Patient is a 80 y.o. male seen in the SNF at [facility] today for evaluation of chronic medical conditions.     Hospitalized from 02/02/2014 to 02/06/2014 for Acute ischemic stroke, embolicwith left upper extremity weakness and oral pharyngeal dysphagia.   Problem List Items Addressed This Visit   HYPERTENSION     Controlled, takes Metoprolol 25mg  bid.      GERD     Takes Omeprazole 20mg        CVA (cerebral vascular accident)     Right sided weakness with right wrist/hand contracture. Grip strength is still 5/5. Ambulates with walker and w/c to go further. New onset of decreased the left sided coordination 01/2014. C/o worsened in his right sided weakness and stiffness in his left side. Continue PT      Atrial fibrillation - Primary     Atrial fibrillation remained rate controlled. Patient was placed on Eliquis per neurology's recommendations. event. EKG normal sinus rhythm      Unspecified constipation     Stable, takes prn Dulcolax suppository q12hr. Usually BM daily        Ectropion of left lower eyelid     Chronic irritation-will see Ophthalmology in a few weeks.     Osteoarthritis of hand, left     Aches and stiffness. No erythema, heat, or swelling. Will apply MyoFlex cram qid-observe.        Review of Systems:  Review of Systems  Constitutional: Negative for fever, chills, weight loss, malaise/fatigue and diaphoresis.       Generalized weakness and the right sided weakness from previous CVA. New left sided weakness from the recent CVA.   HENT: Positive for hearing loss. Negative for congestion, ear discharge, ear pain, nosebleeds, sore throat and tinnitus.   Eyes: Negative for  blurred vision, double vision, photophobia, pain, discharge and redness.       Left eye blindness  Respiratory: Negative for cough, hemoptysis, sputum production, shortness of breath, wheezing and stridor.   Cardiovascular: Positive for leg swelling. Negative for chest pain, palpitations, orthopnea, claudication and PND.       Trace pedal edema R>L  Gastrointestinal: Negative for heartburn, nausea, vomiting, abdominal pain, diarrhea, constipation, blood in stool and melena.  Genitourinary: Negative for dysuria, urgency, frequency, hematuria and flank pain.  Musculoskeletal: Positive for joint pain. Negative for back pain, falls, myalgias and neck pain.       W/c for mobility due to all limbs weakness from hx of CVA and recent new CVA. Left hand joints aches and stiffness.   Skin: Negative for itching and rash.       Right cholecystostomy surgical scar. The medial left lower leg surgical scar from previous melanoma removal. Right upper back carbuncle-healed.   Neurological: Positive for focal weakness and weakness. Negative for dizziness, tingling, tremors, sensory change, speech change, seizures, loss of consciousness and headaches.       Right sided weakness from previous CVA even if grip strength is 5/5. The right wrist and hand contracture. Ambulates with walker and w/c to go further. C/o progressive weakness noted in his RUE. New onset of the left sided decreased coordination-cannot transfer self independently and  dropped his utensil  Endo/Heme/Allergies: Negative for environmental allergies and polydipsia. Bruises/bleeds easily.  Psychiatric/Behavioral: Negative for depression, suicidal ideas, hallucinations, memory loss and substance abuse. The patient is not nervous/anxious and does not have insomnia.    Past Medical History  Diagnosis Date  . GERD (gastroesophageal reflux disease)   . Hypertension   . Hearing loss     wears hearing aid - right side  . Leg swelling   . Dysphagia   .  Bruises easily     due to coumadin  . Weakness     difficulty walking  . Contact lens/glasses fitting   . Stroke 1999, 02/02/14  . Cancer   . 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 Gait disturbance   . COPD (chronic obstructive pulmonary disease)   . Peripheral vascular disease   . History of melanoma   . Diverticulosis   . Abnormality of gait 05/16/2013  . Choledocholithiasis with obstruction 06/28/13  . Embolic stroke 5/91/63  . PAF (paroxysmal atrial fibrillation)   . Hyperlipidemia   . Bradycardia   . Protein-calorie malnutrition, severe   . Ventricular tachycardia (paroxysmal)   . Obstructive sleep apnea of adult     uses cpap, pt does not know setting  . Personal history of fall 05/29/2013  . Fracture of femoral neck, right, closed 3114  . Long term (current) use of anticoagulants     PAF and embolic CVA  . Anemia, unspecified 07/05/2013  . Xerophthalmia 07/05/2013  . 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 Actinic keratosis   . Seborrheic keratosis   . Aortic valve stenosis   . Cerebrovascular disease   . Elevated liver enzymes   . Diverticulosis   . Herpes zoster 02/08/14    Left facial nerve distribution  . Right hemiparesis   . Melanoma 2009  . Hyperglycemia   . Xerophthalmia    Past Surgical History  Procedure Laterality Date  . Excision of melanoma  2009    left leg  . Melanoma excision      many melanoma removed in past  . Melanoma excision  10/11/2011    Procedure: MELANOMA EXCISION;  Surgeon: Pedro Earls, MD;  Location: [place];  Service: General;  Laterality: Left;  EXCISION melanoma left leg with full thickness skin grafting from left lower abdomen.  . Joint replacement  2012    r fem head fx  . Lung benign area removed   1970's  . Hernia repair  1983  . Melanoma excision  05/16/2012    Procedure: MELANOMA EXCISION;  Surgeon: Pedro Earls, MD;  Location: [facility];  Service: General;  Laterality: Left;  Nodule Removal of Melanoma on Left Shin  . Ercp N/A 07/04/2013     Procedure: ENDOSCOPIC RETROGRADE CHOLANGIOPANCREATOGRAPHY (ERCP);  Surgeon: Irene Shipper, MD;  Location: [facility];  Service: Endoscopy;  Laterality: N/A;   Social History:   reports that he quit smoking about 49 years ago. His smoking use included Pipe. He has never used smokeless tobacco. He reports that he does not drink alcohol or use illicit drugs.  Family History  Problem Relation Age of Onset  . Heart disease Father 59  . Cancer Son     prostate  . Dementia Sister     One sister has dementia    Medications: Patient's Medications  New Prescriptions   No medications on file  Previous Medications   ACETAMINOPHEN (TYLENOL) 325 MG TABLET    Take 650 mg by mouth every 6 (  six) hours as needed for pain.    ALUM & MAG HYDROXIDE-SIMETH (MINTOX) 341-937-90 MG/5ML SUSPENSION    Take 30 mLs by mouth every 6 (six) hours as needed for indigestion or heartburn.   APIXABAN (ELIQUIS) 5 MG TABS TABLET    Take 1 tablet (5 mg total) by mouth 2 (two) times daily.   BISACODYL (DULCOLAX) 10 MG SUPPOSITORY    Place 10 mg rectally every 12 (twelve) hours as needed for moderate constipation.   DOXYCYCLINE (VIBRAMYCIN) 100 MG CAPSULE       FUROSEMIDE (LASIX) 20 MG TABLET       HYDROXYPROPYL METHYLCELLULOSE (ISOPTO TEARS) 2.5 % OPHTHALMIC SOLUTION    Place 1 drop into both eyes 4 (four) times daily as needed (eye irritation).    MECLIZINE (ANTIVERT) 25 MG TABLET    Take 25 mg by mouth 3 (three) times daily as needed for dizziness.    METOPROLOL TARTRATE (LOPRESSOR) 25 MG TABLET    Take 25 mg by mouth 2 (two) times daily.   OMEPRAZOLE (PRILOSEC) 20 MG CAPSULE    Take 20 mg by mouth every morning.    POLYETHYLENE GLYCOL (MIRALAX / GLYCOLAX) PACKET    Take 17 g by mouth every other day.   WARFARIN (COUMADIN) 4 MG TABLET       WHITE PETROLATUM-MINERAL OIL (SYSTANE NIGHTTIME) OINT    Place 1 application into both eyes at bedtime.  Modified Medications   No medications on file  Discontinued Medications    No medications on file     Physical Exam: Physical Exam  Constitutional: He is oriented to person, place, and time. He appears well-developed and well-nourished. No distress.  HENT:  Head: Normocephalic and atraumatic.  Right Ear: External ear normal.  Left Ear: External ear normal.  Nose: Nose normal.  Mouth/Throat: Oropharynx is clear and moist. No oropharyngeal exudate.  Eyes: Conjunctivae are normal. Pupils are equal, round, and reactive to light. Right eye exhibits no discharge. No scleral icterus.  Left eye blindness. The right lower eyelid ectropion.   Neck: Normal range of motion. Neck supple. No JVD present. No tracheal deviation present. No thyromegaly present.  Cardiovascular: Normal rate and normal heart sounds.  An irregular rhythm present.  Pulmonary/Chest: Effort normal and breath sounds normal. No stridor. No respiratory distress. He has no wheezes. He has no rales. He exhibits no tenderness.  Abdominal: He exhibits no distension. There is no tenderness. There is no rebound and no guarding.  Musculoskeletal: Normal range of motion. He exhibits edema. He exhibits no tenderness.  The right sided weakness since previous CVA-muscle strength is still 5/5. The right wrist and hand contractures-able to make fist and cannot extend fully. No longer ambulates with walker and w/c for mobility Trace pedal edema R>L Left sided weakness from the recent CVA-mild-grip strength 5/5  Lymphadenopathy:    He has no cervical adenopathy.  Neurological: He is alert and oriented to person, place, and time. He displays abnormal reflex. No cranial nerve deficit. Coordination normal.  New onset of the left sided decreased coordination-cannot transfer self independently and dropped his utensil, grip strength is still 5/5   Skin: Skin is warm and dry. No rash noted. He is not diaphoretic. No erythema. No pallor.  The right cholecystostomy surgical scar. The medial left lower leg surgical scar from  previous melanoma removal. Right right upper back carbuncle-healed.   Psychiatric: Thought content normal. His mood appears anxious. His affect is not angry and not inappropriate. His speech is not  rapid and/or pressured, not delayed and not slurred. He is agitated. He is not aggressive, not hyperactive, not slowed and not withdrawn. Cognition and memory are impaired. He expresses impulsivity and inappropriate judgment. He does not exhibit a depressed mood. He exhibits abnormal recent memory. He exhibits normal remote memory.   (type .physexam) Filed Vitals:   07/03/14 1135  BP: 150/70  Pulse: 72  Temp: 97.5 F (36.4 C)  TempSrc: Tympanic  Resp: 18      Labs reviewed: Basic Metabolic Panel:  Recent Labs  08/13/13 0425 09/13/13  02/02/14 1910 02/04/14 0534 04/12/14  NA 137 137  < > 137 142 138  K 3.3* 4.9  < > 4.3 4.2 5.1  CL 106  --   --  100 107  --   CO2 24  --   --  22 25  --   GLUCOSE 98  --   --  135* 90  --   BUN 19 19  < > 29* 32* 32*  CREATININE 1.09 1.0  < > 1.22 1.31 1.1  CALCIUM 8.2*  --   --  8.4 8.1*  --   TSH  --  2.34  --   --   --   --   < > = values in this interval not displayed. Liver Function Tests:  Recent Labs  08/09/13 1749 08/10/13 0805  02/01/14 02/04/14 0534 04/12/14  AST 28 15  < > 11* 143* 12*  ALT 22 15  < > 8* 194* 8*  ALKPHOS 259* 194*  < > 96 234* 105  BILITOT 1.0 0.8  --   --  1.5*  --   PROT 6.6 5.2*  --   --  5.7*  --   ALBUMIN 2.5* 1.8*  --   --  2.5*  --   < > = values in this interval not displayed.  Recent Labs  08/09/13 1805  LIPASE 13   CBC:  Recent Labs  08/09/13 1749  08/13/13 0425  02/02/14 1910 02/04/14 0534 04/12/14  WBC 23.0*  < > 11.2*  < > 11.7* 12.6* 7.8  NEUTROABS 20.3*  --   --   --  11.3*  --   --   HGB 12.7*  < > 11.3*  < > 11.1* 10.1* 12.0*  HCT 38.6*  < > 35.2*  < > 34.4* 32.0* 36*  MCV 88.3  < > 91.0  --  90.5 92.0  --   PLT 280  < > 311  < > 226 194 268  < > = values in this interval not  displayed.  Past Procedures:  02/01/14 CXR mild cardiomegaly without pulmonary vascular congestion or pleural effusion, old granulomatous disease, no inflammatory consolidate or suspicious nodule, small patchy ares of atelectasis or non-consolidated pneumonia are seen at the lung bases.   2D Echocardiogram  - Left ventricle: Wall thickness was increased in a pattern of mild LVH. Systolic function was normal. The estimated ejection fraction was in the range of 55% to 60%. - Aortic valve: There was mild stenosis.  - Left atrium: The atrium was moderately dilated. - Atrial septum: No defect or patent foramen ovale was identified.   MRI did in fact show Small acute left lateral medullary and left anterior frontal lobe (pre motor/Broca's area) infarcts. No associated mass effect. Petechial hemorrhage versus chronic micro hemorrhage in the left frontal lobe lesion, but no malignant hemorrhagic transformation. 2. Decreased flow or occlusion of the distal left vertebral artery corresponding  to the lateral medullary infarct. Left MCA M1 and branch irregularity with no focal stenosis or major branch occlusion.   Assessment/Plan GERD Takes Omeprazole 20mg      Unspecified constipation Stable, takes prn Dulcolax suppository q12hr. Usually BM daily      Atrial fibrillation Atrial fibrillation remained rate controlled. Patient was placed on Eliquis per neurology's recommendations. event. EKG normal sinus rhythm    HYPERTENSION Controlled, takes Metoprolol 25mg  bid.    CVA (cerebral vascular accident) Right sided weakness with right wrist/hand contracture. Grip strength is still 5/5. Ambulates with walker and w/c to go further. New onset of decreased the left sided coordination 01/2014. C/o worsened in his right sided weakness and stiffness in his left side. Continue PT    Ectropion of left lower eyelid Chronic irritation-will see Ophthalmology in a few weeks.   Osteoarthritis of hand,  left Aches and stiffness. No erythema, heat, or swelling. Will apply MyoFlex cram qid-observe.     Family/ Staff Communication: observe the patient.   Goals of Care: SNF  Labs/tests ordered: none

## 2014-07-12 IMAGING — XA IR CHOLECYSTOSTOMY
2 series · 5 of 5 positions shown · non-contrast
Comparison: none

INDICATION: Acute on chronic cholecystitis, poor operative
candidate.

[Series 4: care single · 1 of 1 slices shown]
[im 1/1]
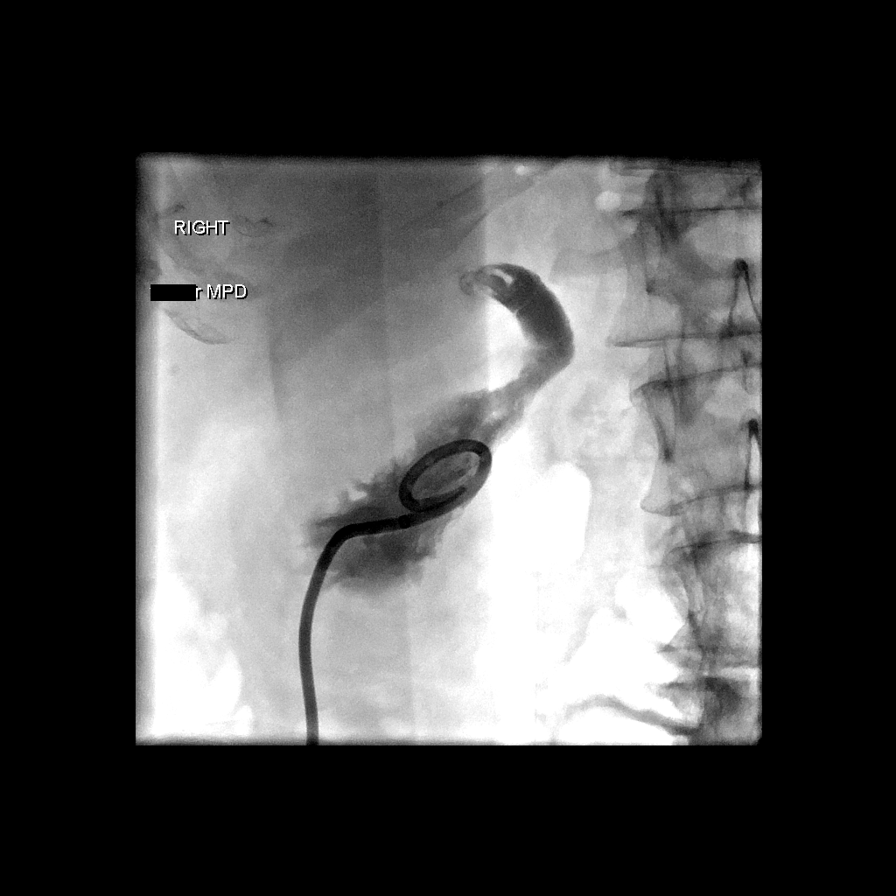

[Series 300: sp perc cholecystostomy · 4 of 4 slices shown]
[im 1/4]
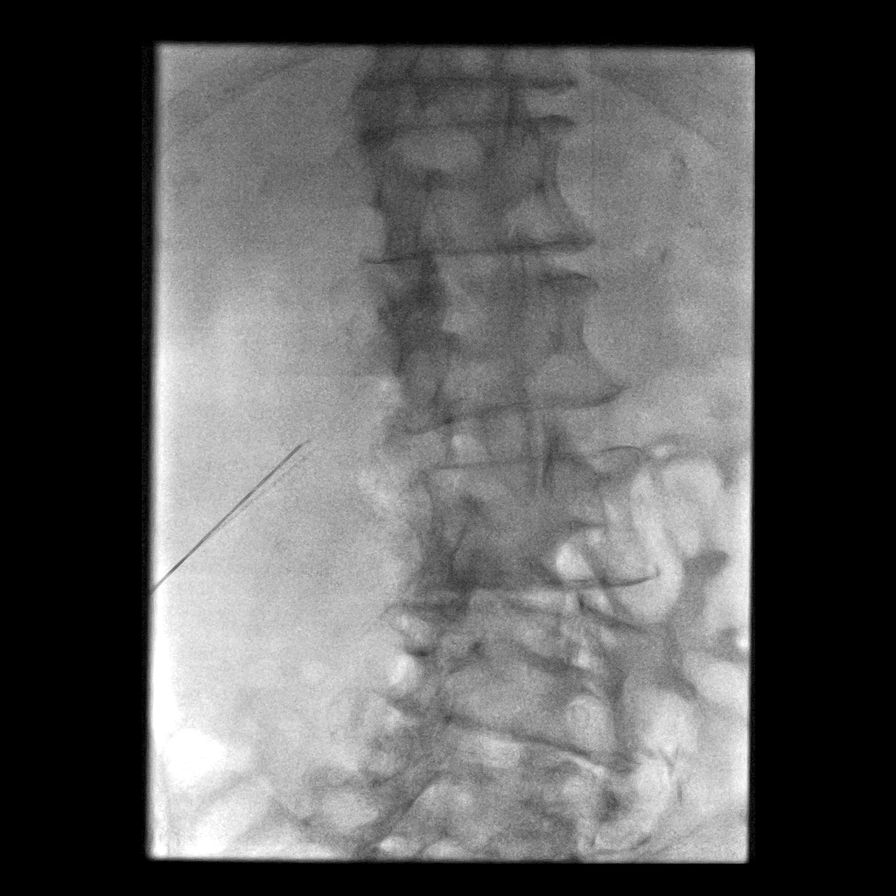
[im 2/4]
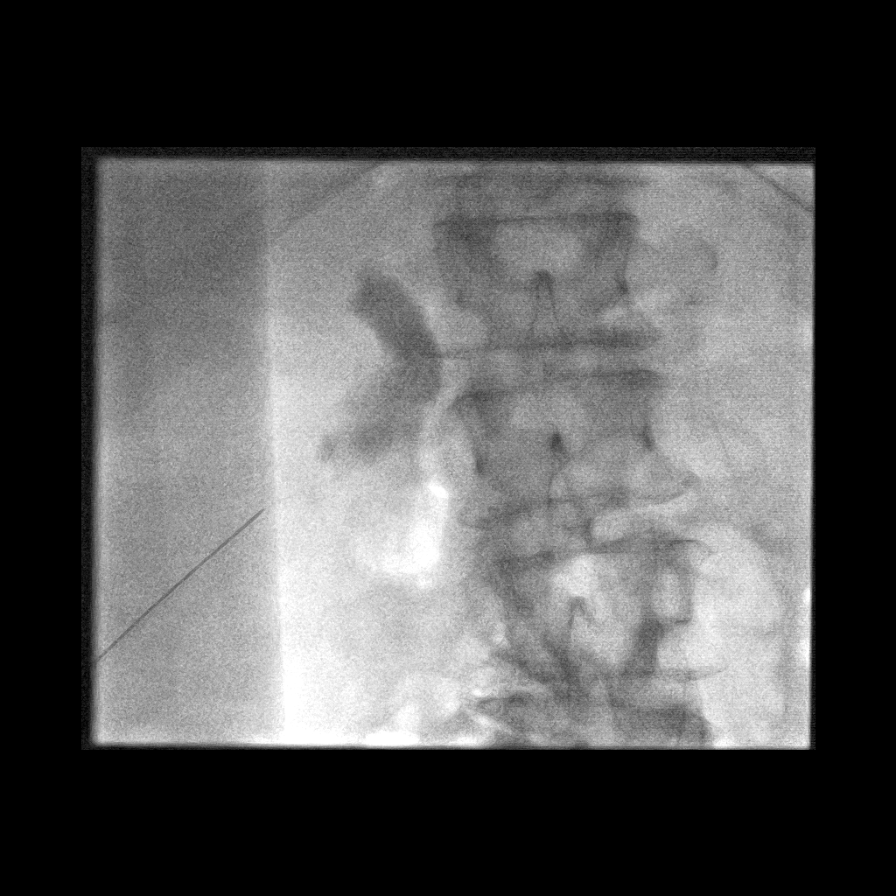
[im 3/4]
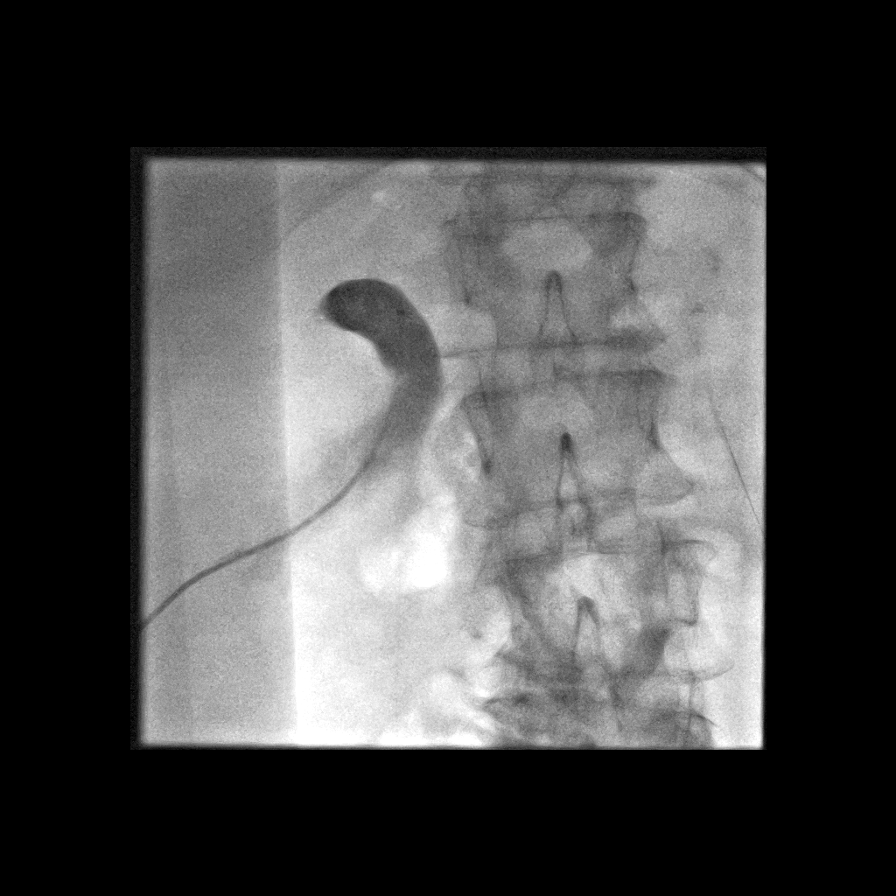
[im 4/4]
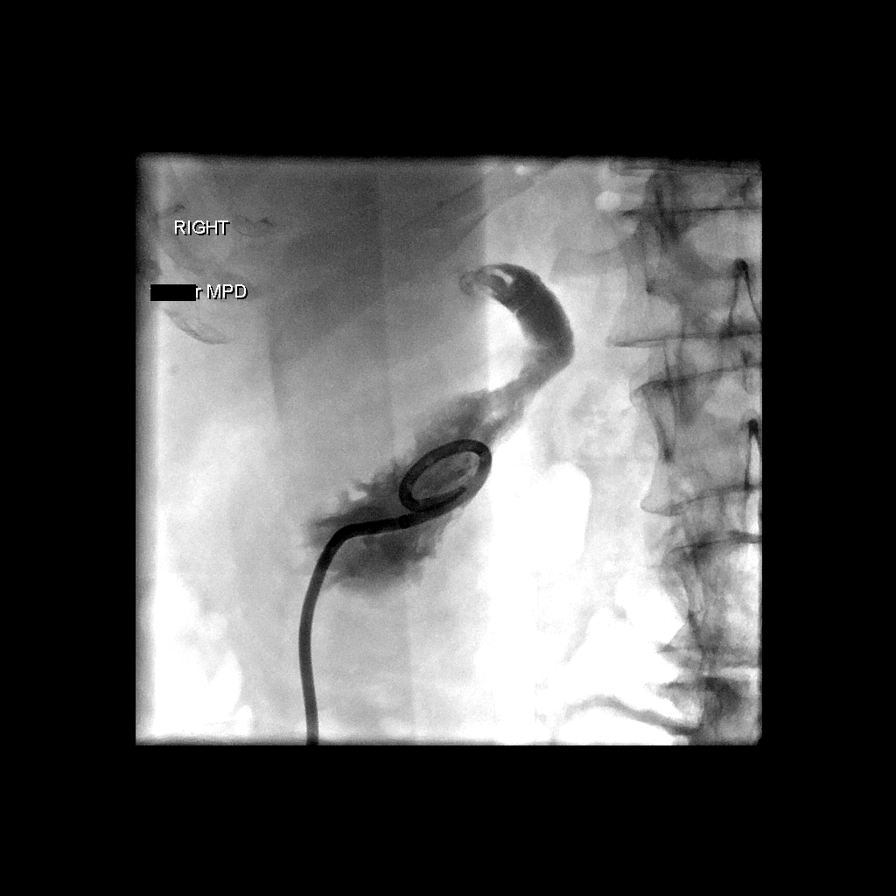

[5 of 5 positions shown; findings below may reference images not displayed]

ULTRASOUND AND FLUOROSCOPIC-GUIDED CHOLECYSTOSTOMY TUBE PLACEMENT

Comparisons: Abdominal ultrasound - 08/09/2013; MRCP - 06/29/2013;
CT of the pelvis - 06/29/2013

Medications: Fentanyl 50 mcg IV; Versed 0.5 mg IV; The patient is
currently admitted to the hospital and on intravenous antibiotics.
Antibiotics were administered with an appropriate time frame prior
to skin puncture.

Contrast: 10 ml 9mnipaque-LFF administered into the gallbladder
fossa

Total Moderate Sedation Time: 15 minutes

Fluoroscopy Time: 48 seconds.

Complications: None immediate

Technique / Findings:

Informed written consent was obtained from the patient and the
patient's son after a discussion of the risks, benefits and
alternatives to treatment.  Questions regarding the procedure were
encouraged and answered.  A timeout was performed prior to the
initiation of the procedure.

Exhaustive preprocedural ultrasound scanning was performed however
failed to delineate an adequate trans hepatic approach to the
gallbladder secondary to the low lying position of the gallbladder
as well as the interposed colon and small bowel as was demonstrated
on previous cross-sectional imaging.  As such, a direct approach to
the gallbladder was necessary for cholecystostomy access.

The right upper abdominal quadrant was prepped and draped in the
usual sterile fashion, and a sterile drape was applied covering the
operative field.  Maximum barrier sterile technique with sterile
gowns and gloves were used for the procedure.  A timeout was
performed prior to the initiation of the procedure.  Local
anesthesia was provided with 1% lidocaine with epinephrine.

Again, utilized a direct approach, a 22 gauge needle was advanced
into the gallbladder under direct ultrasound guidance.  An
ultrasound image was saved for documentation purposes.  Appropriate
intraluminal puncture was confirmed with the efflux of purulent
bile and advancement of an 0.018 wire into the gallbladder lumen.
The needle was exchanged for an Accustick set.  A small amount of
contrast was injected to confirm appropriate intraluminal
positioning.  Over a Benson wire, a 10.2-Bimal Tatiana cholecystomy
tube was advanced into the gallbladder fossa, coiled and locked.  A
small amount of purulent bile was aspirated with the sample cap and
sent to the laboratory for analysis.  A small amount of contrast
was injected as several post procedural spot radiographic images
were obtained in various obliquities.  The catheter was secured to
the skin with suture and a Stat lock device, connected to a
drainage bag and a dressing was placed.  The patient tolerated the
procedure well without immediate postprocedural complication.
IMPRESSION: 1.  Successful ultrasound and fluoroscopic guided placement of a
10.2 French cholecystostomy tube. Again, a direct approach to the
gallbladder was necessary for cholecystostomy placement secondary
to lack of adequate sonographic window for transhepatic approach as
well as the interposed colon and small bowel as was demonstrated on
previously performed cross-sectional imaging.

2.  A small sample of the aspirated purulent appearing bile was
sent to the laboratory for analysis.

## 2014-07-15 ENCOUNTER — Other Ambulatory Visit: Payer: Self-pay | Admitting: Nurse Practitioner

## 2014-07-22 ENCOUNTER — Encounter: Payer: Self-pay | Admitting: Nurse Practitioner

## 2014-07-22 ENCOUNTER — Non-Acute Institutional Stay (SKILLED_NURSING_FACILITY): Payer: Medicare Other | Admitting: Nurse Practitioner

## 2014-07-22 DIAGNOSIS — I480 Paroxysmal atrial fibrillation: Secondary | ICD-10-CM

## 2014-07-22 DIAGNOSIS — I4891 Unspecified atrial fibrillation: Secondary | ICD-10-CM

## 2014-07-22 DIAGNOSIS — K219 Gastro-esophageal reflux disease without esophagitis: Secondary | ICD-10-CM

## 2014-07-22 DIAGNOSIS — IMO0002 Reserved for concepts with insufficient information to code with codable children: Secondary | ICD-10-CM

## 2014-07-22 DIAGNOSIS — Z7901 Long term (current) use of anticoagulants: Secondary | ICD-10-CM

## 2014-07-22 DIAGNOSIS — L03114 Cellulitis of left upper limb: Secondary | ICD-10-CM

## 2014-07-22 NOTE — Assessment & Plan Note (Signed)
The ulnar aspect of the left middle arm heat, erythema, swelling developed from previous skin tear. Will apply Bactroban ointment daily until healed. Doxycycline 100mg  bid x 7 days.

## 2014-07-22 NOTE — Assessment & Plan Note (Signed)
Hx of CVA and Afib. Switched from Coumadin to Eliquis since the past CVA 01/2014

## 2014-07-22 NOTE — Assessment & Plan Note (Signed)
Takes Omeprazole 20mg 

## 2014-07-22 NOTE — Assessment & Plan Note (Signed)
Atrial fibrillation remained rate controlled. Patient was placed on Eliquis per neurology's recommendations. event. EKG normal sinus rhythm  

## 2014-07-22 NOTE — Progress Notes (Signed)
Patient ID: [name], male   DOB: 08-19-1919, 80 y.o.   MRN: [id number]   Code Status: DNR  Allergies  Allergen Reactions  . Sulfonamide Derivatives Rash    REACTION: rash    Chief Complaint  Patient presents with  . Medical Management of Chronic Issues  . Acute Visit    ulnar aspect mid upper arm cellulitis     HPI: Patient is a 80 y.o. male seen in the SNF at [facility] today for evaluation of left upper arm cellulitis and chronic medical conditions.     Hospitalized from 02/02/2014 to 02/06/2014 for Acute ischemic stroke, embolicwith left upper extremity weakness and oral pharyngeal dysphagia.   Problem List Items Addressed This Visit   GERD     Takes Omeprazole 20mg       Long term current use of anticoagulant     Hx of CVA and Afib. Switched from Coumadin to Eliquis since the past CVA 01/2014      Atrial fibrillation     Atrial fibrillation remained rate controlled. Patient was placed on Eliquis per neurology's recommendations. event. EKG normal sinus rhythm     Cellulitis of left upper arm - Primary     The ulnar aspect of the left middle arm heat, erythema, swelling developed from previous skin tear. Will apply Bactroban ointment daily until healed. Doxycycline 100mg  bid x 7 days.         Review of Systems:  Review of Systems  Constitutional: Negative for fever, chills, weight loss, malaise/fatigue and diaphoresis.       Generalized weakness and the right sided weakness from previous CVA. New left sided weakness from the recent CVA.   HENT: Positive for hearing loss. Negative for congestion, ear discharge, ear pain, nosebleeds, sore throat and tinnitus.   Eyes: Negative for blurred vision, double vision, photophobia, pain, discharge and redness.       Left eye blindness  Respiratory: Negative for cough, hemoptysis, sputum production, shortness of breath, wheezing and stridor.   Cardiovascular: Positive for leg swelling. Negative for chest pain,  palpitations, orthopnea, claudication and PND.       Trace pedal edema R>L  Gastrointestinal: Negative for heartburn, nausea, vomiting, abdominal pain, diarrhea, constipation, blood in stool and melena.  Genitourinary: Negative for dysuria, urgency, frequency, hematuria and flank pain.  Musculoskeletal: Positive for joint pain. Negative for back pain, falls, myalgias and neck pain.       W/c for mobility due to all limbs weakness from hx of CVA and recent new CVA. Left hand joints aches and stiffness.   Skin: Negative for itching and rash.       Right cholecystostomy surgical scar. The medial left lower leg surgical scar from previous melanoma removal. The ulnar aspect mid L upper arm erythema, swelling, heat from previous skin tear.   Neurological: Positive for focal weakness and weakness. Negative for dizziness, tingling, tremors, sensory change, speech change, seizures, loss of consciousness and headaches.       Right sided weakness from previous CVA even if grip strength is 5/5. The right wrist and hand contracture. Ambulates with walker and w/c to go further. C/o progressive weakness noted in his RUE. New onset of the left sided decreased coordination-cannot transfer self independently and dropped his utensil  Endo/Heme/Allergies: Negative for environmental allergies and polydipsia. Bruises/bleeds easily.  Psychiatric/Behavioral: Negative for depression, suicidal ideas, hallucinations, memory loss and substance abuse. The patient is not nervous/anxious and does not have insomnia.    Past  Medical History  Diagnosis Date  . GERD (gastroesophageal reflux disease)   . Hypertension   . Hearing loss     wears hearing aid - right side  . Leg swelling   . Dysphagia   . Bruises easily     due to coumadin  . Weakness     difficulty walking  . Contact lens/glasses fitting   . Stroke 1999, 02/02/14  . Cancer   . 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 Gait disturbance   . COPD (chronic obstructive pulmonary  disease)   . Peripheral vascular disease   . History of melanoma   . Diverticulosis   . Abnormality of gait 05/16/2013  . Choledocholithiasis with obstruction 06/28/13  . Embolic stroke 0/25/42  . PAF (paroxysmal atrial fibrillation)   . Hyperlipidemia   . Bradycardia   . Protein-calorie malnutrition, severe   . Ventricular tachycardia (paroxysmal)   . Obstructive sleep apnea of adult     uses cpap, pt does not know setting  . Personal history of fall 05/29/2013  . Fracture of femoral neck, right, closed 3114  . Long term (current) use of anticoagulants     PAF and embolic CVA  . Anemia, unspecified 07/05/2013  . Xerophthalmia 07/05/2013  . 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 Actinic keratosis   . Seborrheic keratosis   . Aortic valve stenosis   . Cerebrovascular disease   . Elevated liver enzymes   . Diverticulosis   . Herpes zoster 02/08/14    Left facial nerve distribution  . Right hemiparesis   . Melanoma 2009  . Hyperglycemia   . Xerophthalmia    Past Surgical History  Procedure Laterality Date  . Excision of melanoma  2009    left leg  . Melanoma excision      many melanoma removed in past  . Melanoma excision  10/11/2011    Procedure: MELANOMA EXCISION;  Surgeon: Pedro Earls, MD;  Location: [facility];  Service: General;  Laterality: Left;  EXCISION melanoma left leg with full thickness skin grafting from left lower abdomen.  . Joint replacement  2012    r fem head fx  . Lung benign area removed   1970's  . Hernia repair  1983  . Melanoma excision  05/16/2012    Procedure: MELANOMA EXCISION;  Surgeon: Pedro Earls, MD;  Location: [facility];  Service: General;  Laterality: Left;  Nodule Removal of Melanoma on Left Shin  . Ercp N/A 07/04/2013    Procedure: ENDOSCOPIC RETROGRADE CHOLANGIOPANCREATOGRAPHY (ERCP);  Surgeon: Irene Shipper, MD;  Location: [facility];  Service: Endoscopy;  Laterality: N/A;   Social History:   reports that he quit smoking about 49 years ago. His  smoking use included Pipe. He has never used smokeless tobacco. He reports that he does not drink alcohol or use illicit drugs.  Family History  Problem Relation Age of Onset  . Heart disease Father 21  . Cancer Son     prostate  . Dementia Sister     One sister has dementia    Medications: Patient's Medications  New Prescriptions   No medications on file  Previous Medications   ACETAMINOPHEN (TYLENOL) 325 MG TABLET    Take 650 mg by mouth every 6 (six) hours as needed for pain.    ALUM & MAG HYDROXIDE-SIMETH (MINTOX) 706-237-62 MG/5ML SUSPENSION    Take 30 mLs by mouth every 6 (six) hours as needed for indigestion or heartburn.   APIXABAN (ELIQUIS) 5  MG TABS TABLET    Take 1 tablet (5 mg total) by mouth 2 (two) times daily.   BISACODYL (DULCOLAX) 10 MG SUPPOSITORY    Place 10 mg rectally every 12 (twelve) hours as needed for moderate constipation.   DOXYCYCLINE (VIBRAMYCIN) 100 MG CAPSULE       FUROSEMIDE (LASIX) 20 MG TABLET       HYDROXYPROPYL METHYLCELLULOSE (ISOPTO TEARS) 2.5 % OPHTHALMIC SOLUTION    Place 1 drop into both eyes 4 (four) times daily as needed (eye irritation).    METOPROLOL TARTRATE (LOPRESSOR) 25 MG TABLET    Take 25 mg by mouth 2 (two) times daily.   OMEPRAZOLE (PRILOSEC) 20 MG CAPSULE    Take 20 mg by mouth every morning.    POLYETHYLENE GLYCOL (MIRALAX / GLYCOLAX) PACKET    Take 17 g by mouth every other day.   WARFARIN (COUMADIN) 4 MG TABLET       WHITE PETROLATUM-MINERAL OIL (SYSTANE NIGHTTIME) OINT    Place 1 application into both eyes at bedtime.  Modified Medications   No medications on file  Discontinued Medications   No medications on file     Physical Exam: Physical Exam  Constitutional: He is oriented to person, place, and time. He appears well-developed and well-nourished. No distress.  HENT:  Head: Normocephalic and atraumatic.  Right Ear: External ear normal.  Left Ear: External ear normal.  Nose: Nose normal.  Mouth/Throat: Oropharynx  is clear and moist. No oropharyngeal exudate.  Eyes: Conjunctivae are normal. Pupils are equal, round, and reactive to light. Right eye exhibits no discharge. No scleral icterus.  Left eye blindness. The right lower eyelid ectropion.   Neck: Normal range of motion. Neck supple. No JVD present. No tracheal deviation present. No thyromegaly present.  Cardiovascular: Normal rate and normal heart sounds.  An irregular rhythm present.  Pulmonary/Chest: Effort normal and breath sounds normal. No stridor. No respiratory distress. He has no wheezes. He has no rales. He exhibits no tenderness.  Abdominal: He exhibits no distension. There is no tenderness. There is no rebound and no guarding.  Musculoskeletal: Normal range of motion. He exhibits edema. He exhibits no tenderness.  The right sided weakness since previous CVA-muscle strength is still 5/5. The right wrist and hand contractures-able to make fist and cannot extend fully. No longer ambulates with walker and w/c for mobility Trace pedal edema R>L Left sided weakness from the recent CVA-mild-grip strength 5/5  Lymphadenopathy:    He has no cervical adenopathy.  Neurological: He is alert and oriented to person, place, and time. He displays abnormal reflex. No cranial nerve deficit. Coordination normal.  New onset of the left sided decreased coordination-cannot transfer self independently and dropped his utensil, grip strength is still 5/5   Skin: Skin is warm and dry. No rash noted. He is not diaphoretic. No erythema. No pallor.  he right cholecystostomy surgical scar. The medial left lower leg surgical scar from previous melanoma removal. Right right upper back carbuncle-healed.    Psychiatric: Thought content normal. His mood appears anxious. His affect is not angry and not inappropriate. His speech is not rapid and/or pressured, not delayed and not slurred. He is agitated. He is not aggressive, not hyperactive, not slowed and not withdrawn.  Cognition and memory are impaired. He expresses impulsivity and inappropriate judgment. He does not exhibit a depressed mood. He exhibits abnormal recent memory. He exhibits normal remote memory.   (type .physexam) Filed Vitals:   07/22/14 1349  BP: 128/69  Pulse: 50  Temp: 96 F (35.6 C)  TempSrc: Tympanic  Resp: 18      Labs reviewed: Basic Metabolic Panel:  Recent Labs  08/13/13 0425 09/13/13  02/02/14 1910 02/04/14 0534 04/12/14  NA 137 137  < > 137 142 138  K 3.3* 4.9  < > 4.3 4.2 5.1  CL 106  --   --  100 107  --   CO2 24  --   --  22 25  --   GLUCOSE 98  --   --  135* 90  --   BUN 19 19  < > 29* 32* 32*  CREATININE 1.09 1.0  < > 1.22 1.31 1.1  CALCIUM 8.2*  --   --  8.4 8.1*  --   TSH  --  2.34  --   --   --   --   < > = values in this interval not displayed. Liver Function Tests:  Recent Labs  08/09/13 1749 08/10/13 0805  02/01/14 02/04/14 0534 04/12/14  AST 28 15  < > 11* 143* 12*  ALT 22 15  < > 8* 194* 8*  ALKPHOS 259* 194*  < > 96 234* 105  BILITOT 1.0 0.8  --   --  1.5*  --   PROT 6.6 5.2*  --   --  5.7*  --   ALBUMIN 2.5* 1.8*  --   --  2.5*  --   < > = values in this interval not displayed.  Recent Labs  08/09/13 1805  LIPASE 13   CBC:  Recent Labs  08/09/13 1749  08/13/13 0425  02/02/14 1910 02/04/14 0534 04/12/14  WBC 23.0*  < > 11.2*  < > 11.7* 12.6* 7.8  NEUTROABS 20.3*  --   --   --  11.3*  --   --   HGB 12.7*  < > 11.3*  < > 11.1* 10.1* 12.0*  HCT 38.6*  < > 35.2*  < > 34.4* 32.0* 36*  MCV 88.3  < > 91.0  --  90.5 92.0  --   PLT 280  < > 311  < > 226 194 268  < > = values in this interval not displayed.  Past Procedures:  02/01/14 CXR mild cardiomegaly without pulmonary vascular congestion or pleural effusion, old granulomatous disease, no inflammatory consolidate or suspicious nodule, small patchy ares of atelectasis or non-consolidated pneumonia are seen at the lung bases.   2D Echocardiogram  - Left ventricle: Wall  thickness was increased in a pattern of mild LVH. Systolic function was normal. The estimated ejection fraction was in the range of 55% to 60%. - Aortic valve: There was mild stenosis.  - Left atrium: The atrium was moderately dilated. - Atrial septum: No defect or patent foramen ovale was identified.   MRI did in fact show Small acute left lateral medullary and left anterior frontal lobe (pre motor/Broca's area) infarcts. No associated mass effect. Petechial hemorrhage versus chronic micro hemorrhage in the left frontal lobe lesion, but no malignant hemorrhagic transformation. 2. Decreased flow or occlusion of the distal left vertebral artery corresponding to the lateral medullary infarct. Left MCA M1 and branch irregularity with no focal stenosis or major branch occlusion.   Assessment/Plan Cellulitis of left upper arm The ulnar aspect of the left middle arm heat, erythema, swelling developed from previous skin tear. Will apply Bactroban ointment daily until healed. Doxycycline 100mg  bid x 7 days.    GERD Takes Omeprazole 20mg   Long term current use of anticoagulant Hx of CVA and Afib. Switched from Coumadin to Eliquis since the past CVA 01/2014    Atrial fibrillation Atrial fibrillation remained rate controlled. Patient was placed on Eliquis per neurology's recommendations. event. EKG normal sinus rhythm     Family/ Staff Communication: observe the patient.   Goals of Care: SNF  Labs/tests ordered: none

## 2014-08-21 ENCOUNTER — Non-Acute Institutional Stay (SKILLED_NURSING_FACILITY): Payer: Medicare Other | Admitting: Nurse Practitioner

## 2014-08-21 ENCOUNTER — Encounter: Payer: Self-pay | Admitting: Nurse Practitioner

## 2014-08-21 DIAGNOSIS — L03114 Cellulitis of left upper limb: Secondary | ICD-10-CM

## 2014-08-21 DIAGNOSIS — L853 Xerosis cutis: Secondary | ICD-10-CM | POA: Insufficient documentation

## 2014-08-21 DIAGNOSIS — I480 Paroxysmal atrial fibrillation: Secondary | ICD-10-CM

## 2014-08-21 DIAGNOSIS — Z7901 Long term (current) use of anticoagulants: Secondary | ICD-10-CM

## 2014-08-21 DIAGNOSIS — I10 Essential (primary) hypertension: Secondary | ICD-10-CM

## 2014-08-21 DIAGNOSIS — K5900 Constipation, unspecified: Secondary | ICD-10-CM

## 2014-08-21 DIAGNOSIS — IMO0002 Reserved for concepts with insufficient information to code with codable children: Secondary | ICD-10-CM

## 2014-08-21 DIAGNOSIS — I4891 Unspecified atrial fibrillation: Secondary | ICD-10-CM

## 2014-08-21 DIAGNOSIS — K219 Gastro-esophageal reflux disease without esophagitis: Secondary | ICD-10-CM

## 2014-08-21 DIAGNOSIS — L258 Unspecified contact dermatitis due to other agents: Secondary | ICD-10-CM

## 2014-08-21 NOTE — Progress Notes (Signed)
Patient ID: [name], male   DOB: [birth year], 80 y.o.   MRN: [id number]   Code Status: DNR  Allergies  Allergen Reactions  . Sulfonamide Derivatives Rash    REACTION: rash    Chief Complaint  Patient presents with  . Medical Management of Chronic Issues    HPI: Patient is a 80 y.o. male seen in the SNF at [facility] today for evaluation of chronic medical conditions.     Hospitalized from 02/02/2014 to 02/06/2014 for Acute ischemic stroke, embolicwith left upper extremity weakness and oral pharyngeal dysphagia.   Problem List Items Addressed This Visit   Atrial fibrillation     Atrial fibrillation remained rate controlled. Patient was placed on Eliquis per neurology's recommendations. event. EKG normal sinus rhythm      Cellulitis of left upper arm     The ulnar aspect of the left middle arm heat, erythema, swelling developed from previous skin tear. Resolved on Bactroban ointment daily until healed. Completed Doxycycline 100mg  bid x 7 days.      Dry skin dermatitis - Primary     A palm sized patchy itching area ulnar aspect of the left upper arm: will try 1% hydrocortisone cream bid until healed.     GERD     Takes Omeprazole 20mg        HYPERTENSION     Controlled, takes Metoprolol 25mg  bid.       Long term current use of anticoagulant     Hx of CVA and Afib. Switched from Coumadin to Eliquis since the past CVA 01/2014       Unspecified constipation     Stable, takes prn Dulcolax suppository q12hr. Usually BM daily          Review of Systems:  Review of Systems  Constitutional: Negative for fever, chills, weight loss, malaise/fatigue and diaphoresis.       Generalized weakness and the right sided weakness from previous CVA. New left sided weakness from the recent CVA.   HENT: Positive for hearing loss. Negative for congestion, ear discharge, ear pain, nosebleeds, sore throat and tinnitus.   Eyes: Negative for blurred vision, double vision,  photophobia, pain, discharge and redness.       Left eye blindness  Respiratory: Negative for cough, hemoptysis, sputum production, shortness of breath, wheezing and stridor.   Cardiovascular: Positive for leg swelling. Negative for chest pain, palpitations, orthopnea, claudication and PND.       Trace pedal edema R>L  Gastrointestinal: Negative for heartburn, nausea, vomiting, abdominal pain, diarrhea, constipation, blood in stool and melena.  Genitourinary: Negative for dysuria, urgency, frequency, hematuria and flank pain.  Musculoskeletal: Positive for joint pain. Negative for back pain, falls, myalgias and neck pain.       W/c for mobility due to all limbs weakness from hx of CVA and recent new CVA. Left hand joints aches and stiffness.   Skin: Negative for itching and rash.       Right cholecystostomy surgical scar. The medial left lower leg surgical scar from prevA palm sized patchy itching area ulnar aspect of the left upper armious melanoma removal. Lots of moles  Neurological: Positive for focal weakness and weakness. Negative for dizziness, tingling, tremors, sensory change, speech change, seizures, loss of consciousness and headaches.       Right sided weakness from previous CVA even if grip strength is 5/5. The right wrist and hand contracture. Ambulates with walker and w/c to go further. C/o progressive weakness noted in  his RUE. New onset of the left sided decreased coordination-cannot transfer self independently and dropped his utensil. Left facial weakness from previous CVA.   Endo/Heme/Allergies: Negative for environmental allergies and polydipsia. Bruises/bleeds easily.  Psychiatric/Behavioral: Negative for depression, suicidal ideas, hallucinations, memory loss and substance abuse. The patient is not nervous/anxious and does not have insomnia.    Past Medical History  Diagnosis Date  . GERD (gastroesophageal reflux disease)   . Hypertension   . Hearing loss     wears hearing  aid - right side  . Leg swelling   . Dysphagia   . Bruises easily     due to coumadin  . Weakness     difficulty walking  . Contact lens/glasses fitting   . Stroke 1999, 02/02/14  . Cancer   . 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 Gait disturbance   . COPD (chronic obstructive pulmonary disease)   . Peripheral vascular disease   . History of melanoma   . Diverticulosis   . Abnormality of gait 05/16/2013  . Choledocholithiasis with obstruction 06/28/13  . Embolic stroke 5/40/08  . PAF (paroxysmal atrial fibrillation)   . Hyperlipidemia   . Bradycardia   . Protein-calorie malnutrition, severe   . Ventricular tachycardia (paroxysmal)   . Obstructive sleep apnea of adult     uses cpap, pt does not know setting  . Personal history of fall 05/29/2013  . Fracture of femoral neck, right, closed 3114  . Long term (current) use of anticoagulants     PAF and embolic CVA  . Anemia, unspecified 07/05/2013  . Xerophthalmia 07/05/2013  . 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 Actinic keratosis   . Seborrheic keratosis   . Aortic valve stenosis   . Cerebrovascular disease   . Elevated liver enzymes   . Diverticulosis   . Herpes zoster 02/08/14    Left facial nerve distribution  . Right hemiparesis   . Melanoma 2009  . Hyperglycemia   . Xerophthalmia    Past Surgical History  Procedure Laterality Date  . Excision of melanoma  2009    left leg  . Melanoma excision      many melanoma removed in past  . Melanoma excision  10/11/2011    Procedure: MELANOMA EXCISION;  Surgeon: Pedro Earls, MD;  Location: [place];  Service: General;  Laterality: Left;  EXCISION melanoma left leg with full thickness skin grafting from left lower abdomen.  . Joint replacement  2012    r fem head fx  . Lung benign area removed   1970's  . Hernia repair  1983  . Melanoma excision  05/16/2012    Procedure: MELANOMA EXCISION;  Surgeon: Pedro Earls, MD;  Location: [facility];  Service: General;  Laterality: Left;  Nodule Removal of  Melanoma on Left Shin  . Ercp N/A 07/04/2013    Procedure: ENDOSCOPIC RETROGRADE CHOLANGIOPANCREATOGRAPHY (ERCP);  Surgeon: Irene Shipper, MD;  Location: [facility];  Service: Endoscopy;  Laterality: N/A;   Social History:   reports that he quit smoking about 50 years ago. His smoking use included Pipe. He has never used smokeless tobacco. He reports that he does not drink alcohol or use illicit drugs.  Family History  Problem Relation Age of Onset  . Heart disease Father 1  . Cancer Son     prostate  . Dementia Sister     One sister has dementia    Medications: Patient's Medications  New Prescriptions   No medications on  file  Previous Medications   ACETAMINOPHEN (TYLENOL) 325 MG TABLET    Take 650 mg by mouth every 6 (six) hours as needed for pain.    ALUM & MAG HYDROXIDE-SIMETH (MINTOX) 614-431-54 MG/5ML SUSPENSION    Take 30 mLs by mouth every 6 (six) hours as needed for indigestion or heartburn.   APIXABAN (ELIQUIS) 5 MG TABS TABLET    Take 1 tablet (5 mg total) by mouth 2 (two) times daily.   BISACODYL (DULCOLAX) 10 MG SUPPOSITORY    Place 10 mg rectally every 12 (twelve) hours as needed for moderate constipation.   DOXYCYCLINE (VIBRAMYCIN) 100 MG CAPSULE       FUROSEMIDE (LASIX) 20 MG TABLET       HYDROXYPROPYL METHYLCELLULOSE (ISOPTO TEARS) 2.5 % OPHTHALMIC SOLUTION    Place 1 drop into both eyes 4 (four) times daily as needed (eye irritation).    METOPROLOL TARTRATE (LOPRESSOR) 25 MG TABLET    Take 25 mg by mouth 2 (two) times daily.   OMEPRAZOLE (PRILOSEC) 20 MG CAPSULE    Take 20 mg by mouth every morning.    POLYETHYLENE GLYCOL (MIRALAX / GLYCOLAX) PACKET    Take 17 g by mouth every other day.   WARFARIN (COUMADIN) 4 MG TABLET       WHITE PETROLATUM-MINERAL OIL (SYSTANE NIGHTTIME) OINT    Place 1 application into both eyes at bedtime.  Modified Medications   No medications on file  Discontinued Medications   No medications on file     Physical Exam: Physical Exam   Constitutional: He is oriented to person, place, and time. He appears well-developed and well-nourished. No distress.  HENT:  Head: Normocephalic and atraumatic.  Right Ear: External ear normal.  Left Ear: External ear normal.  Nose: Nose normal.  Mouth/Throat: Oropharynx is clear and moist. No oropharyngeal exudate.  Eyes: Conjunctivae are normal. Pupils are equal, round, and reactive to light. Right eye exhibits no discharge. No scleral icterus.  Left eye blindness. The right lower eyelid ectropion.   Neck: Normal range of motion. Neck supple. No JVD present. No tracheal deviation present. No thyromegaly present.  Cardiovascular: Normal rate and normal heart sounds.  An irregular rhythm present.  Pulmonary/Chest: Effort normal and breath sounds normal. No stridor. No respiratory distress. He has no wheezes. He has no rales. He exhibits no tenderness.  Abdominal: He exhibits no distension. There is no tenderness. There is no rebound and no guarding.  Musculoskeletal: Normal range of motion. He exhibits edema. He exhibits no tenderness.  The right sided weakness since previous CVA-muscle strength is still 5/5. The right wrist and hand contractures-able to make fist and cannot extend fully. No longer ambulates with walker and w/c for mobility Trace pedal edema R>L Left sided weakness from the recent CVA-mild-grip strength 5/5  Lymphadenopathy:    He has no cervical adenopathy.  Neurological: He is alert and oriented to person, place, and time. He displays abnormal reflex. No cranial nerve deficit. Coordination normal.  New onset of the left sided decreased coordination-cannot transfer self independently and dropped his utensil, grip strength is still 5/5. Hx of right sided weakness with R hand contracture. Left facial weakness.    Skin: Skin is warm and dry. No rash noted. He is not diaphoretic. No erythema. No pallor.  he right cholecystostomy surgical scar. The medial left lower leg surgical  scar from previous melanoma removal. Right right upper back carbuncle-healed. A palm sized patchy itching area ulnar aspect of the left upper  arm   Psychiatric: Thought content normal. His mood appears anxious. His affect is not angry and not inappropriate. His speech is not rapid and/or pressured, not delayed and not slurred. He is agitated. He is not aggressive, not hyperactive, not slowed and not withdrawn. Cognition and memory are impaired. He expresses impulsivity and inappropriate judgment. He does not exhibit a depressed mood. He exhibits abnormal recent memory. He exhibits normal remote memory.   (type .physexam) Filed Vitals:   08/21/14 1049  BP: 150/80  Pulse: 66  Temp: 97 F (36.1 C)  TempSrc: Tympanic  Resp: 18      Labs reviewed: Basic Metabolic Panel:  Recent Labs  09/13/13  02/02/14 1910 02/04/14 0534 04/12/14  NA 137  < > 137 142 138  K 4.9  < > 4.3 4.2 5.1  CL  --   --  100 107  --   CO2  --   --  22 25  --   GLUCOSE  --   --  135* 90  --   BUN 19  < > 29* 32* 32*  CREATININE 1.0  < > 1.22 1.31 1.1  CALCIUM  --   --  8.4 8.1*  --   TSH 2.34  --   --   --   --   < > = values in this interval not displayed. Liver Function Tests:  Recent Labs  02/01/14 02/04/14 0534 04/12/14  AST 11* 143* 12*  ALT 8* 194* 8*  ALKPHOS 96 234* 105  BILITOT  --  1.5*  --   PROT  --  5.7*  --   ALBUMIN  --  2.5*  --    No results found for this basename: LIPASE, AMYLASE,  in the last 8760 hours CBC:  Recent Labs  02/02/14 1910 02/04/14 0534 04/12/14  WBC 11.7* 12.6* 7.8  NEUTROABS 11.3*  --   --   HGB 11.1* 10.1* 12.0*  HCT 34.4* 32.0* 36*  MCV 90.5 92.0  --   PLT 226 194 268    Past Procedures:  02/01/14 CXR mild cardiomegaly without pulmonary vascular congestion or pleural effusion, old granulomatous disease, no inflammatory consolidate or suspicious nodule, small patchy ares of atelectasis or non-consolidated pneumonia are seen at the lung bases.   2D  Echocardiogram  - Left ventricle: Wall thickness was increased in a pattern of mild LVH. Systolic function was normal. The estimated ejection fraction was in the range of 55% to 60%. - Aortic valve: There was mild stenosis.  - Left atrium: The atrium was moderately dilated. - Atrial septum: No defect or patent foramen ovale was identified.   MRI did in fact show Small acute left lateral medullary and left anterior frontal lobe (pre motor/Broca's area) infarcts. No associated mass effect. Petechial hemorrhage versus chronic micro hemorrhage in the left frontal lobe lesion, but no malignant hemorrhagic transformation. 2. Decreased flow or occlusion of the distal left vertebral artery corresponding to the lateral medullary infarct. Left MCA M1 and branch irregularity with no focal stenosis or major branch occlusion.   Assessment/Plan Dry skin dermatitis A palm sized patchy itching area ulnar aspect of the left upper arm: will try 1% hydrocortisone cream bid until healed.   Atrial fibrillation Atrial fibrillation remained rate controlled. Patient was placed on Eliquis per neurology's recommendations. event. EKG normal sinus rhythm    Cellulitis of left upper arm The ulnar aspect of the left middle arm heat, erythema, swelling developed from previous skin tear. Resolved on  Bactroban ointment daily until healed. Completed Doxycycline 100mg  bid x 7 days.    GERD Takes Omeprazole 20mg      HYPERTENSION Controlled, takes Metoprolol 25mg  bid.     Long term current use of anticoagulant Hx of CVA and Afib. Switched from Coumadin to Eliquis since the past CVA 01/2014     Unspecified constipation Stable, takes prn Dulcolax suppository q12hr. Usually BM daily       Family/ Staff Communication: observe the patient.   Goals of Care: SNF  Labs/tests ordered: none

## 2014-08-21 NOTE — Assessment & Plan Note (Signed)
Stable, takes prn Dulcolax suppository q12hr. Usually BM daily

## 2014-08-21 NOTE — Assessment & Plan Note (Signed)
Controlled, takes Metoprolol 25mg bid.   

## 2014-08-21 NOTE — Assessment & Plan Note (Signed)
Hx of CVA and Afib. Switched from Coumadin to Eliquis since the past CVA 01/2014

## 2014-08-21 NOTE — Assessment & Plan Note (Signed)
Atrial fibrillation remained rate controlled. Patient was placed on Eliquis per neurology's recommendations. event. EKG normal sinus rhythm  

## 2014-08-21 NOTE — Assessment & Plan Note (Signed)
The ulnar aspect of the left middle arm heat, erythema, swelling developed from previous skin tear. Resolved on Bactroban ointment daily until healed. Completed Doxycycline 100mg  bid x 7 days.

## 2014-08-21 NOTE — Assessment & Plan Note (Signed)
Takes Omeprazole 20mg 

## 2014-08-21 NOTE — Assessment & Plan Note (Signed)
A palm sized patchy itching area ulnar aspect of the left upper arm: will try 1% hydrocortisone cream bid until healed.

## 2014-08-26 ENCOUNTER — Encounter: Payer: Self-pay | Admitting: Nurse Practitioner

## 2014-08-28 ENCOUNTER — Encounter: Payer: Self-pay | Admitting: Nurse Practitioner

## 2014-09-26 ENCOUNTER — Encounter: Payer: Self-pay | Admitting: Nurse Practitioner

## 2014-10-03 ENCOUNTER — Encounter: Payer: Self-pay | Admitting: Nurse Practitioner

## 2014-10-03 ENCOUNTER — Non-Acute Institutional Stay (SKILLED_NURSING_FACILITY): Payer: Medicare Other | Admitting: Nurse Practitioner

## 2014-10-03 DIAGNOSIS — I482 Chronic atrial fibrillation, unspecified: Secondary | ICD-10-CM

## 2014-10-03 DIAGNOSIS — M19042 Primary osteoarthritis, left hand: Secondary | ICD-10-CM

## 2014-10-03 DIAGNOSIS — L03114 Cellulitis of left upper limb: Secondary | ICD-10-CM

## 2014-10-03 DIAGNOSIS — L850 Acquired ichthyosis: Secondary | ICD-10-CM

## 2014-10-03 DIAGNOSIS — I639 Cerebral infarction, unspecified: Secondary | ICD-10-CM

## 2014-10-03 DIAGNOSIS — K5900 Constipation, unspecified: Secondary | ICD-10-CM

## 2014-10-03 DIAGNOSIS — L853 Xerosis cutis: Secondary | ICD-10-CM

## 2014-10-03 DIAGNOSIS — K219 Gastro-esophageal reflux disease without esophagitis: Secondary | ICD-10-CM

## 2014-10-03 DIAGNOSIS — I10 Essential (primary) hypertension: Secondary | ICD-10-CM

## 2014-10-03 NOTE — Assessment & Plan Note (Signed)
Takes Omeprazole 20mg 

## 2014-10-03 NOTE — Assessment & Plan Note (Signed)
Right sided weakness with right wrist/hand contracture. Grip strength is still 5/5. Ambulates with walker and w/c to go further. New onset of decreased the left sided coordination 01/2014. C/o worsened in his right sided weakness and stiffness in his left side. Continue PT

## 2014-10-03 NOTE — Assessment & Plan Note (Signed)
better 

## 2014-10-03 NOTE — Assessment & Plan Note (Signed)
Healed

## 2014-10-03 NOTE — Assessment & Plan Note (Signed)
Atrial fibrillation remained rate controlled. Patient was placed on Eliquis per neurology's recommendations. event. EKG normal sinus rhythm  

## 2014-10-03 NOTE — Assessment & Plan Note (Signed)
Stable, takes prn Dulcolax suppository q12hr. Usually BM daily

## 2014-10-03 NOTE — Assessment & Plan Note (Signed)
08/26/14 changed Tylenol 650mg  bid to q4h prn.

## 2014-10-03 NOTE — Assessment & Plan Note (Signed)
Controlled, takes Metoprolol 25mg bid.   

## 2014-10-03 NOTE — Progress Notes (Signed)
Patient ID: [name], male   DOB: 10-Jun-1919, 80 y.o.   MRN: [id number]   Code Status: DNR  Allergies  Allergen Reactions  . Sulfonamide Derivatives Rash    REACTION: rash    Chief Complaint  Patient presents with  . Medical Management of Chronic Issues    HPI: Patient is a 80 y.o. male seen in the SNF at [facility] today for evaluation of chronic medical conditions.     Hospitalized from 02/02/2014 to 02/06/2014 for Acute ischemic stroke, embolicwith left upper extremity weakness and oral pharyngeal dysphagia.   Problem List Items Addressed This Visit   Osteoarthritis of hand, left     08/26/14 changed Tylenol 650mg  bid to q4h prn.      GERD     Takes Omeprazole 20mg        Essential hypertension     Controlled, takes Metoprolol 25mg  bid.        Dry skin dermatitis     better    CVA (cerebral vascular accident)     Right sided weakness with right wrist/hand contracture. Grip strength is still 5/5. Ambulates with walker and w/c to go further. New onset of decreased the left sided coordination 01/2014. C/o worsened in his right sided weakness and stiffness in his left side. Continue PT    Constipation - Primary     Stable, takes prn Dulcolax suppository q12hr. Usually BM daily        Cellulitis of left upper arm     Healed.     Atrial fibrillation     Atrial fibrillation remained rate controlled. Patient was placed on Eliquis per neurology's recommendations. event. EKG normal sinus rhythm        Review of Systems:  Review of Systems  Constitutional: Negative for fever, chills, weight loss, malaise/fatigue and diaphoresis.       Generalized weakness and the right sided weakness from previous CVA. New left sided weakness from the recent CVA.   HENT: Positive for hearing loss. Negative for congestion, ear discharge, ear pain, nosebleeds, sore throat and tinnitus.   Eyes: Negative for blurred vision, double vision, photophobia, pain, discharge and  redness.       Left eye blindness  Respiratory: Negative for cough, hemoptysis, sputum production, shortness of breath, wheezing and stridor.   Cardiovascular: Positive for leg swelling. Negative for chest pain, palpitations, orthopnea, claudication and PND.       Trace pedal edema R>L  Gastrointestinal: Negative for heartburn, nausea, vomiting, abdominal pain, diarrhea, constipation, blood in stool and melena.  Genitourinary: Negative for dysuria, urgency, frequency, hematuria and flank pain.  Musculoskeletal: Positive for joint pain. Negative for back pain, falls, myalgias and neck pain.       W/c for mobility due to all limbs weakness from hx of CVA and recent new CVA. Left hand joints aches and stiffness.   Skin: Negative for itching and rash.       Right cholecystostomy surgical scar. The medial left lower leg surgical scar from prevA palm sized patchy itching area ulnar aspect of the left upper armious melanoma removal. Lots of moles  Neurological: Positive for focal weakness and weakness. Negative for dizziness, tingling, tremors, sensory change, speech change, seizures, loss of consciousness and headaches.       Right sided weakness from previous CVA even if grip strength is 5/5. The right wrist and hand contracture. Ambulates with walker and w/c to go further. C/o progressive weakness noted in his RUE. New onset of  the left sided decreased coordination-cannot transfer self independently and dropped his utensil. Left facial weakness from previous CVA.   Endo/Heme/Allergies: Negative for environmental allergies and polydipsia. Bruises/bleeds easily.  Psychiatric/Behavioral: Negative for depression, suicidal ideas, hallucinations, memory loss and substance abuse. The patient is not nervous/anxious and does not have insomnia.    Past Medical History  Diagnosis Date  . GERD (gastroesophageal reflux disease)   . Hypertension   . Hearing loss     wears hearing aid - right side  . Leg swelling    . Dysphagia   . Bruises easily     due to coumadin  . Weakness     difficulty walking  . Contact lens/glasses fitting   . Stroke 1999, 02/02/14  . Cancer   . 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 Gait disturbance   . COPD (chronic obstructive pulmonary disease)   . Peripheral vascular disease   . History of melanoma   . Diverticulosis   . Abnormality of gait 05/16/2013  . Choledocholithiasis with obstruction 06/28/13  . Embolic stroke 03/14/80  . PAF (paroxysmal atrial fibrillation)   . Hyperlipidemia   . Bradycardia   . Protein-calorie malnutrition, severe   . Ventricular tachycardia (paroxysmal)   . Obstructive sleep apnea of adult     uses cpap, pt does not know setting  . Personal history of fall 05/29/2013  . Fracture of femoral neck, right, closed 3114  . Long term (current) use of anticoagulants     PAF and embolic CVA  . Anemia, unspecified 07/05/2013  . Xerophthalmia 07/05/2013  . 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 Actinic keratosis   . Seborrheic keratosis   . Aortic valve stenosis   . Cerebrovascular disease   . Elevated liver enzymes   . Diverticulosis   . Herpes zoster 02/08/14    Left facial nerve distribution  . Right hemiparesis   . Melanoma 2009  . Hyperglycemia   . Xerophthalmia    Past Surgical History  Procedure Laterality Date  . Excision of melanoma  2009    left leg  . Melanoma excision      many melanoma removed in past  . Melanoma excision  10/11/2011    Procedure: MELANOMA EXCISION;  Surgeon: Pedro Earls, MD;  Location: [facility];  Service: General;  Laterality: Left;  EXCISION melanoma left leg with full thickness skin grafting from left lower abdomen.  . Joint replacement  2012    r fem head fx  . Lung benign area removed   1970's  . Hernia repair  1983  . Melanoma excision  05/16/2012    Procedure: MELANOMA EXCISION;  Surgeon: Pedro Earls, MD;  Location: [facility];  Service: General;  Laterality: Left;  Nodule Removal of Melanoma on Left Shin  . Ercp N/A  07/04/2013    Procedure: ENDOSCOPIC RETROGRADE CHOLANGIOPANCREATOGRAPHY (ERCP);  Surgeon: Irene Shipper, MD;  Location: [facility];  Service: Endoscopy;  Laterality: N/A;   Social History:   reports that he quit smoking about 50 years ago. His smoking use included Pipe. He has never used smokeless tobacco. He reports that he does not drink alcohol or use illicit drugs.  Family History  Problem Relation Age of Onset  . Heart disease Father 42  . Cancer Son     prostate  . Dementia Sister     One sister has dementia    Medications: Patient's Medications  New Prescriptions   No medications on file  Previous Medications  ACETAMINOPHEN (TYLENOL) 325 MG TABLET    Take 650 mg by mouth every 6 (six) hours as needed for pain.    ALUM & MAG HYDROXIDE-SIMETH (MINTOX) 712-458-09 MG/5ML SUSPENSION    Take 30 mLs by mouth every 6 (six) hours as needed for indigestion or heartburn.   APIXABAN (ELIQUIS) 5 MG TABS TABLET    Take 1 tablet (5 mg total) by mouth 2 (two) times daily.   BISACODYL (DULCOLAX) 10 MG SUPPOSITORY    Place 10 mg rectally every 12 (twelve) hours as needed for moderate constipation.   DOXYCYCLINE (VIBRAMYCIN) 100 MG CAPSULE       FUROSEMIDE (LASIX) 20 MG TABLET       HYDROXYPROPYL METHYLCELLULOSE (ISOPTO TEARS) 2.5 % OPHTHALMIC SOLUTION    Place 1 drop into both eyes 4 (four) times daily as needed (eye irritation).    METOPROLOL TARTRATE (LOPRESSOR) 25 MG TABLET    Take 25 mg by mouth 2 (two) times daily.   OMEPRAZOLE (PRILOSEC) 20 MG CAPSULE    Take 20 mg by mouth every morning.    POLYETHYLENE GLYCOL (MIRALAX / GLYCOLAX) PACKET    Take 17 g by mouth every other day.   WARFARIN (COUMADIN) 4 MG TABLET       WHITE PETROLATUM-MINERAL OIL (SYSTANE NIGHTTIME) OINT    Place 1 application into both eyes at bedtime.  Modified Medications   No medications on file  Discontinued Medications   No medications on file     Physical Exam: Physical Exam  Constitutional: He is oriented  to person, place, and time. He appears well-developed and well-nourished. No distress.  HENT:  Head: Normocephalic and atraumatic.  Right Ear: External ear normal.  Left Ear: External ear normal.  Nose: Nose normal.  Mouth/Throat: Oropharynx is clear and moist. No oropharyngeal exudate.  Eyes: Conjunctivae are normal. Pupils are equal, round, and reactive to light. Right eye exhibits no discharge. No scleral icterus.  Left eye blindness. The right lower eyelid ectropion.   Neck: Normal range of motion. Neck supple. No JVD present. No tracheal deviation present. No thyromegaly present.  Cardiovascular: Normal rate and normal heart sounds.  An irregular rhythm present.  Pulmonary/Chest: Effort normal and breath sounds normal. No stridor. No respiratory distress. He has no wheezes. He has no rales. He exhibits no tenderness.  Abdominal: He exhibits no distension. There is no tenderness. There is no rebound and no guarding.  Musculoskeletal: Normal range of motion. He exhibits edema. He exhibits no tenderness.  The right sided weakness since previous CVA-muscle strength is still 5/5. The right wrist and hand contractures-able to make fist and cannot extend fully. No longer ambulates with walker and w/c for mobility Trace pedal edema R>L Left sided weakness from the recent CVA-mild-grip strength 5/5  Lymphadenopathy:    He has no cervical adenopathy.  Neurological: He is alert and oriented to person, place, and time. He displays abnormal reflex. No cranial nerve deficit. Coordination normal.  New onset of the left sided decreased coordination-cannot transfer self independently and dropped his utensil, grip strength is still 5/5. Hx of right sided weakness with R hand contracture. Left facial weakness.    Skin: Skin is warm and dry. No rash noted. He is not diaphoretic. No erythema. No pallor.  he right cholecystostomy surgical scar. The medial left lower leg surgical scar from previous melanoma  removal. Right right upper back carbuncle-healed. A palm sized patchy itching area ulnar aspect of the left upper arm   Psychiatric: Thought content  normal. His mood appears anxious. His affect is not angry and not inappropriate. His speech is not rapid and/or pressured, not delayed and not slurred. He is agitated. He is not aggressive, not hyperactive, not slowed and not withdrawn. Cognition and memory are impaired. He expresses impulsivity and inappropriate judgment. He does not exhibit a depressed mood. He exhibits abnormal recent memory. He exhibits normal remote memory.   (type .physexam) Filed Vitals:   10/03/14 1528  BP: 130/70  Pulse: 72  Temp: 98 F (36.7 C)  TempSrc: Tympanic  Resp: 20      Labs reviewed: Basic Metabolic Panel:  Recent Labs  02/02/14 1910 02/04/14 0534 04/12/14  NA 137 142 138  K 4.3 4.2 5.1  CL 100 107  --   CO2 22 25  --   GLUCOSE 135* 90  --   BUN 29* 32* 32*  CREATININE 1.22 1.31 1.1  CALCIUM 8.4 8.1*  --    Liver Function Tests:  Recent Labs  02/01/14 02/04/14 0534 04/12/14  AST 11* 143* 12*  ALT 8* 194* 8*  ALKPHOS 96 234* 105  BILITOT  --  1.5*  --   PROT  --  5.7*  --   ALBUMIN  --  2.5*  --    No results found for this basename: LIPASE, AMYLASE,  in the last 8760 hours CBC:  Recent Labs  02/02/14 1910 02/04/14 0534 04/12/14  WBC 11.7* 12.6* 7.8  NEUTROABS 11.3*  --   --   HGB 11.1* 10.1* 12.0*  HCT 34.4* 32.0* 36*  MCV 90.5 92.0  --   PLT 226 194 268    Past Procedures:  02/01/14 CXR mild cardiomegaly without pulmonary vascular congestion or pleural effusion, old granulomatous disease, no inflammatory consolidate or suspicious nodule, small patchy ares of atelectasis or non-consolidated pneumonia are seen at the lung bases.   2D Echocardiogram  - Left ventricle: Wall thickness was increased in a pattern of mild LVH. Systolic function was normal. The estimated ejection fraction was in the range of 55% to 60%. - Aortic  valve: There was mild stenosis.  - Left atrium: The atrium was moderately dilated. - Atrial septum: No defect or patent foramen ovale was identified.   MRI did in fact show Small acute left lateral medullary and left anterior frontal lobe (pre motor/Broca's area) infarcts. No associated mass effect. Petechial hemorrhage versus chronic micro hemorrhage in the left frontal lobe lesion, but no malignant hemorrhagic transformation. 2. Decreased flow or occlusion of the distal left vertebral artery corresponding to the lateral medullary infarct. Left MCA M1 and branch irregularity with no focal stenosis or major branch occlusion.   Assessment/Plan Constipation Stable, takes prn Dulcolax suppository q12hr. Usually BM daily      GERD Takes Omeprazole 20mg      Dry skin dermatitis better  Cellulitis of left upper arm Healed.   Atrial fibrillation Atrial fibrillation remained rate controlled. Patient was placed on Eliquis per neurology's recommendations. event. EKG normal sinus rhythm   CVA (cerebral vascular accident) Right sided weakness with right wrist/hand contracture. Grip strength is still 5/5. Ambulates with walker and w/c to go further. New onset of decreased the left sided coordination 01/2014. C/o worsened in his right sided weakness and stiffness in his left side. Continue PT  Essential hypertension Controlled, takes Metoprolol 25mg  bid.      Osteoarthritis of hand, left 08/26/14 changed Tylenol 650mg  bid to q4h prn.      Family/ Staff Communication: observe the patient.  Goals of Care: SNF  Labs/tests ordered: none

## 2014-10-21 ENCOUNTER — Non-Acute Institutional Stay (SKILLED_NURSING_FACILITY): Payer: Medicare Other | Admitting: Nurse Practitioner

## 2014-10-21 ENCOUNTER — Encounter: Payer: Self-pay | Admitting: Nurse Practitioner

## 2014-10-21 DIAGNOSIS — I482 Chronic atrial fibrillation, unspecified: Secondary | ICD-10-CM

## 2014-10-21 DIAGNOSIS — S8011XD Contusion of right lower leg, subsequent encounter: Secondary | ICD-10-CM

## 2014-10-21 DIAGNOSIS — K5900 Constipation, unspecified: Secondary | ICD-10-CM

## 2014-10-21 DIAGNOSIS — S8011XA Contusion of right lower leg, initial encounter: Secondary | ICD-10-CM | POA: Insufficient documentation

## 2014-10-21 DIAGNOSIS — L850 Acquired ichthyosis: Secondary | ICD-10-CM

## 2014-10-21 DIAGNOSIS — I639 Cerebral infarction, unspecified: Secondary | ICD-10-CM

## 2014-10-21 DIAGNOSIS — L853 Xerosis cutis: Secondary | ICD-10-CM

## 2014-10-21 DIAGNOSIS — K219 Gastro-esophageal reflux disease without esophagitis: Secondary | ICD-10-CM

## 2014-10-21 DIAGNOSIS — M19042 Primary osteoarthritis, left hand: Secondary | ICD-10-CM

## 2014-10-21 DIAGNOSIS — I10 Essential (primary) hypertension: Secondary | ICD-10-CM

## 2014-10-21 NOTE — Assessment & Plan Note (Signed)
Atrial fibrillation remained rate controlled. Patient was placed on Eliquis per neurology's recommendations. event. EKG normal sinus rhythm  

## 2014-10-21 NOTE — Assessment & Plan Note (Signed)
08/26/14 changed Tylenol 650mg  bid to q4h prn.

## 2014-10-21 NOTE — Assessment & Plan Note (Signed)
Stable. Takes Omeprazole 20mg 

## 2014-10-21 NOTE — Assessment & Plan Note (Signed)
better 

## 2014-10-21 NOTE — Assessment & Plan Note (Addendum)
Lateral right leg-no s/s infection-protective measures. Warm compress 60min/each 4x/day until resolved.

## 2014-10-21 NOTE — Assessment & Plan Note (Signed)
Controlled, takes Metoprolol 25mg bid.   

## 2014-10-21 NOTE — Progress Notes (Signed)
Patient ID: [name], male   DOB: [birth year], 80 y.o.   MRN: [id number]   Code Status: DNR  Allergies  Allergen Reactions  . Sulfonamide Derivatives Rash    REACTION: rash    Chief Complaint  Patient presents with  . Medical Management of Chronic Issues  . Acute Visit    requested to be seen R lateral leg hematoma.     HPI: Patient is a 80 y.o. male seen in the SNF at [facility] today for evaluation of R lateral leg hematoma and chronic medical conditions.     Hospitalized from 02/02/2014 to 02/06/2014 for Acute ischemic stroke, embolicwith left upper extremity weakness and oral pharyngeal dysphagia.   Problem List Items Addressed This Visit    Traumatic hematoma of right lower leg - Primary    Lateral right leg-no s/s infection-protective measures. Warm compress 44min/each 4x/day until resolved.     Osteoarthritis of hand, left    08/26/14 changed Tylenol 650mg  bid to q4h prn.       GERD    Stable. Takes Omeprazole 20mg         Essential hypertension    Controlled, takes Metoprolol 25mg  bid.        Dry skin dermatitis    better     CVA (cerebral vascular accident)    Right sided weakness with right wrist/hand contracture. Grip strength is still 5/5. Ambulates with walker and w/c to go further. New onset of decreased the left sided coordination 01/2014. C/o worsened in his right sided weakness and stiffness in his left side. Continue PT     Constipation    Stable, takes prn Dulcolax suppository q12hr. Usually BM daily         Atrial fibrillation    Atrial fibrillation remained rate controlled. Patient was placed on Eliquis per neurology's recommendations. event. EKG normal sinus rhythm         Review of Systems:  Review of Systems  Constitutional: Negative for fever, chills, weight loss, malaise/fatigue and diaphoresis.       Generalized weakness and the right sided weakness from previous CVA. New left sided weakness from the recent CVA.     HENT: Positive for hearing loss. Negative for congestion, ear discharge, ear pain, nosebleeds, sore throat and tinnitus.   Eyes: Negative for blurred vision, double vision, photophobia, pain, discharge and redness.       Left eye blindness  Respiratory: Negative for cough, hemoptysis, sputum production, shortness of breath, wheezing and stridor.   Cardiovascular: Positive for leg swelling. Negative for chest pain, palpitations, orthopnea, claudication and PND.       Trace pedal edema R>L  Gastrointestinal: Negative for heartburn, nausea, vomiting, abdominal pain, diarrhea, constipation, blood in stool and melena.  Genitourinary: Negative for dysuria, urgency, frequency, hematuria and flank pain.  Musculoskeletal: Positive for joint pain. Negative for myalgias, back pain, falls and neck pain.       W/c for mobility due to all limbs weakness from hx of CVA and recent new CVA. Left hand joints aches and stiffness.   Skin: Negative for itching and rash.       Right cholecystostomy surgical scar. The medial left lower leg surgical scar from previous Melanoma removal. A palm sized patchy itching area ulnar aspect of the left upper arm. Lots of moles. A hematoma right lateral leg-not infected.   Neurological: Positive for focal weakness and weakness. Negative for dizziness, tingling, tremors, sensory change, speech change, seizures, loss of consciousness and headaches.  Right sided weakness from previous CVA even if grip strength is 5/5. The right wrist and hand contracture. Ambulates with walker and w/c to go further. C/o progressive weakness noted in his RUE. New onset of the left sided decreased coordination-cannot transfer self independently and dropped his utensil. Left facial weakness from previous CVA.   Endo/Heme/Allergies: Negative for environmental allergies and polydipsia. Bruises/bleeds easily.  Psychiatric/Behavioral: Negative for depression, suicidal ideas, hallucinations, memory loss  and substance abuse. The patient is not nervous/anxious and does not have insomnia.    Past Medical History  Diagnosis Date  . GERD (gastroesophageal reflux disease)   . Hypertension   . Hearing loss     wears hearing aid - right side  . Leg swelling   . Dysphagia   . Bruises easily     due to coumadin  . Weakness     difficulty walking  . Contact lens/glasses fitting   . Stroke 1999, 02/02/14  . Cancer   . 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 Gait disturbance   . COPD (chronic obstructive pulmonary disease)   . Peripheral vascular disease   . History of melanoma   . Diverticulosis   . Abnormality of gait 05/16/2013  . Choledocholithiasis with obstruction 06/28/13  . Embolic stroke 2/75/17  . PAF (paroxysmal atrial fibrillation)   . Hyperlipidemia   . Bradycardia   . Protein-calorie malnutrition, severe   . Ventricular tachycardia (paroxysmal)   . Obstructive sleep apnea of adult     uses cpap, pt does not know setting  . Personal history of fall 05/29/2013  . Fracture of femoral neck, right, closed 3114  . Long term (current) use of anticoagulants     PAF and embolic CVA  . Anemia, unspecified 07/05/2013  . Xerophthalmia 07/05/2013  . 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 Actinic keratosis   . Seborrheic keratosis   . Aortic valve stenosis   . Cerebrovascular disease   . Elevated liver enzymes   . Diverticulosis   . Herpes zoster 02/08/14    Left facial nerve distribution  . Right hemiparesis   . Melanoma 2009  . Hyperglycemia   . Xerophthalmia    Past Surgical History  Procedure Laterality Date  . Excision of melanoma  2009    left leg  . Melanoma excision      many melanoma removed in past  . Melanoma excision  10/11/2011    Procedure: MELANOMA EXCISION;  Surgeon: Pedro Earls, MD;  Location: [place];  Service: General;  Laterality: Left;  EXCISION melanoma left leg with full thickness skin grafting from left lower abdomen.  . Joint replacement  2012    r fem head fx  . Lung  benign area removed   1970's  . Hernia repair  1983  . Melanoma excision  05/16/2012    Procedure: MELANOMA EXCISION;  Surgeon: Pedro Earls, MD;  Location: [facility];  Service: General;  Laterality: Left;  Nodule Removal of Melanoma on Left Shin  . Ercp N/A 07/04/2013    Procedure: ENDOSCOPIC RETROGRADE CHOLANGIOPANCREATOGRAPHY (ERCP);  Surgeon: Irene Shipper, MD;  Location: [facility];  Service: Endoscopy;  Laterality: N/A;   Social History:   reports that he quit smoking about 50 years ago. His smoking use included Pipe. He has never used smokeless tobacco. He reports that he does not drink alcohol or use illicit drugs.  Family History  Problem Relation Age of Onset  . Heart disease Father 7  . Cancer Son  prostate  . Dementia Sister     One sister has dementia    Medications: Patient's Medications  New Prescriptions   No medications on file  Previous Medications   ACETAMINOPHEN (TYLENOL) 325 MG TABLET    Take 650 mg by mouth every 6 (six) hours as needed for pain.    ALUM & MAG HYDROXIDE-SIMETH (MINTOX) 782-956-21 MG/5ML SUSPENSION    Take 30 mLs by mouth every 6 (six) hours as needed for indigestion or heartburn.   APIXABAN (ELIQUIS) 5 MG TABS TABLET    Take 1 tablet (5 mg total) by mouth 2 (two) times daily.   BISACODYL (DULCOLAX) 10 MG SUPPOSITORY    Place 10 mg rectally every 12 (twelve) hours as needed for moderate constipation.   DOXYCYCLINE (VIBRAMYCIN) 100 MG CAPSULE       FUROSEMIDE (LASIX) 20 MG TABLET       HYDROXYPROPYL METHYLCELLULOSE (ISOPTO TEARS) 2.5 % OPHTHALMIC SOLUTION    Place 1 drop into both eyes 4 (four) times daily as needed (eye irritation).    METOPROLOL TARTRATE (LOPRESSOR) 25 MG TABLET    Take 25 mg by mouth 2 (two) times daily.   OMEPRAZOLE (PRILOSEC) 20 MG CAPSULE    Take 20 mg by mouth every morning.    POLYETHYLENE GLYCOL (MIRALAX / GLYCOLAX) PACKET    Take 17 g by mouth every other day.   WARFARIN (COUMADIN) 4 MG TABLET       WHITE  PETROLATUM-MINERAL OIL (SYSTANE NIGHTTIME) OINT    Place 1 application into both eyes at bedtime.  Modified Medications   No medications on file  Discontinued Medications   No medications on file     Physical Exam: Physical Exam  Constitutional: He is oriented to person, place, and time. He appears well-developed and well-nourished. No distress.  HENT:  Head: Normocephalic and atraumatic.  Right Ear: External ear normal.  Left Ear: External ear normal.  Nose: Nose normal.  Mouth/Throat: Oropharynx is clear and moist. No oropharyngeal exudate.  Eyes: Conjunctivae are normal. Pupils are equal, round, and reactive to light. Right eye exhibits no discharge. No scleral icterus.  Left eye blindness. The right lower eyelid ectropion.   Neck: Normal range of motion. Neck supple. No JVD present. No tracheal deviation present. No thyromegaly present.  Cardiovascular: Normal rate and normal heart sounds.  An irregular rhythm present.  Pulmonary/Chest: Effort normal and breath sounds normal. No stridor. No respiratory distress. He has no wheezes. He has no rales. He exhibits no tenderness.  Abdominal: He exhibits no distension. There is no tenderness. There is no rebound and no guarding.  Musculoskeletal: Normal range of motion. He exhibits edema. He exhibits no tenderness.  The right sided weakness since previous CVA-muscle strength is still 5/5. The right wrist and hand contractures-able to make fist and cannot extend fully. No longer ambulates with walker and w/c for mobility Trace pedal edema R>L Left sided weakness from the recent CVA-mild-grip strength 5/5  Lymphadenopathy:    He has no cervical adenopathy.  Neurological: He is alert and oriented to person, place, and time. He displays abnormal reflex. No cranial nerve deficit. Coordination normal.  New onset of the left sided decreased coordination-cannot transfer self independently and dropped his utensil, grip strength is still 5/5. Hx of  right sided weakness with R hand contracture. Left facial weakness.    Skin: Skin is warm and dry. No rash noted. He is not diaphoretic. No erythema. No pallor.  he right cholecystostomy surgical scar. The  medial left lower leg surgical scar from previous melanoma removal. A palm sized patchy itching area ulnar aspect of the left upper arm. R lateral leg hematoma-not infected.    Psychiatric: Thought content normal. His mood appears anxious. His affect is not angry and not inappropriate. His speech is not rapid and/or pressured, not delayed and not slurred. He is agitated. He is not aggressive, not hyperactive, not slowed and not withdrawn. Cognition and memory are impaired. He expresses impulsivity and inappropriate judgment. He does not exhibit a depressed mood. He exhibits abnormal recent memory. He exhibits normal remote memory.   (type .physexam) Filed Vitals:   10/21/14 1352  BP: 160/80  Pulse: 72  Temp: 97.4 F (36.3 C)  TempSrc: Tympanic  Resp: 20      Labs reviewed: Basic Metabolic Panel:  Recent Labs  02/02/14 1910 02/04/14 0534 04/12/14  NA 137 142 138  K 4.3 4.2 5.1  CL 100 107  --   CO2 22 25  --   GLUCOSE 135* 90  --   BUN 29* 32* 32*  CREATININE 1.22 1.31 1.1  CALCIUM 8.4 8.1*  --    Liver Function Tests:  Recent Labs  02/01/14 02/04/14 0534 04/12/14  AST 11* 143* 12*  ALT 8* 194* 8*  ALKPHOS 96 234* 105  BILITOT  --  1.5*  --   PROT  --  5.7*  --   ALBUMIN  --  2.5*  --    No results for input(s): LIPASE, AMYLASE in the last 8760 hours. CBC:  Recent Labs  02/02/14 1910 02/04/14 0534 04/12/14  WBC 11.7* 12.6* 7.8  NEUTROABS 11.3*  --   --   HGB 11.1* 10.1* 12.0*  HCT 34.4* 32.0* 36*  MCV 90.5 92.0  --   PLT 226 194 268    Past Procedures:  02/01/14 CXR mild cardiomegaly without pulmonary vascular congestion or pleural effusion, old granulomatous disease, no inflammatory consolidate or suspicious nodule, small patchy ares of atelectasis or  non-consolidated pneumonia are seen at the lung bases.   2D Echocardiogram  - Left ventricle: Wall thickness was increased in a pattern of mild LVH. Systolic function was normal. The estimated ejection fraction was in the range of 55% to 60%. - Aortic valve: There was mild stenosis.  - Left atrium: The atrium was moderately dilated. - Atrial septum: No defect or patent foramen ovale was identified.   MRI did in fact show Small acute left lateral medullary and left anterior frontal lobe (pre motor/Broca's area) infarcts. No associated mass effect. Petechial hemorrhage versus chronic micro hemorrhage in the left frontal lobe lesion, but no malignant hemorrhagic transformation. 2. Decreased flow or occlusion of the distal left vertebral artery corresponding to the lateral medullary infarct. Left MCA M1 and branch irregularity with no focal stenosis or major branch occlusion.   Assessment/Plan Osteoarthritis of hand, left 08/26/14 changed Tylenol 650mg  bid to q4h prn.     GERD Stable. Takes Omeprazole 20mg       Essential hypertension Controlled, takes Metoprolol 25mg  bid.      Dry skin dermatitis better   CVA (cerebral vascular accident) Right sided weakness with right wrist/hand contracture. Grip strength is still 5/5. Ambulates with walker and w/c to go further. New onset of decreased the left sided coordination 01/2014. C/o worsened in his right sided weakness and stiffness in his left side. Continue PT   Constipation Stable, takes prn Dulcolax suppository q12hr. Usually BM daily       Atrial fibrillation  Atrial fibrillation remained rate controlled. Patient was placed on Eliquis per neurology's recommendations. event. EKG normal sinus rhythm    Traumatic hematoma of right lower leg Lateral right leg-no s/s infection-protective measures. Warm compress 71min/each 4x/day until resolved.     Family/ Staff Communication: observe the patient.   Goals of Care:  SNF  Labs/tests ordered: none

## 2014-10-21 NOTE — Assessment & Plan Note (Signed)
Stable, takes prn Dulcolax suppository q12hr. Usually BM daily

## 2014-10-21 NOTE — Assessment & Plan Note (Signed)
Right sided weakness with right wrist/hand contracture. Grip strength is still 5/5. Ambulates with walker and w/c to go further. New onset of decreased the left sided coordination 01/2014. C/o worsened in his right sided weakness and stiffness in his left side. Continue PT

## 2014-10-23 ENCOUNTER — Encounter: Payer: Self-pay | Admitting: Neurology

## 2014-10-28 ENCOUNTER — Encounter: Payer: Self-pay | Admitting: Nurse Practitioner

## 2014-10-28 ENCOUNTER — Non-Acute Institutional Stay (SKILLED_NURSING_FACILITY): Payer: Medicare Other | Admitting: Nurse Practitioner

## 2014-10-28 DIAGNOSIS — L03115 Cellulitis of right lower limb: Secondary | ICD-10-CM

## 2014-10-28 DIAGNOSIS — I10 Essential (primary) hypertension: Secondary | ICD-10-CM

## 2014-10-28 DIAGNOSIS — M19042 Primary osteoarthritis, left hand: Secondary | ICD-10-CM

## 2014-10-28 DIAGNOSIS — K5900 Constipation, unspecified: Secondary | ICD-10-CM

## 2014-10-28 DIAGNOSIS — I482 Chronic atrial fibrillation, unspecified: Secondary | ICD-10-CM

## 2014-10-28 DIAGNOSIS — Z7901 Long term (current) use of anticoagulants: Secondary | ICD-10-CM

## 2014-10-28 DIAGNOSIS — I639 Cerebral infarction, unspecified: Secondary | ICD-10-CM

## 2014-10-28 DIAGNOSIS — K219 Gastro-esophageal reflux disease without esophagitis: Secondary | ICD-10-CM

## 2014-10-28 DIAGNOSIS — S8011XD Contusion of right lower leg, subsequent encounter: Secondary | ICD-10-CM

## 2014-10-28 NOTE — Assessment & Plan Note (Signed)
Eliquis since the past CVA 01/2014  

## 2014-10-28 NOTE — Assessment & Plan Note (Signed)
Controlled, takes Metoprolol 25mg bid.   

## 2014-10-28 NOTE — Assessment & Plan Note (Signed)
Right sided weakness with right wrist/hand contracture. Grip strength is still 5/5. Ambulates with walker and w/c to go further. New onset of decreased the left sided coordination 01/2014. C/o worsened in his right sided weakness and stiffness in his left side. Continue PT

## 2014-10-28 NOTE — Assessment & Plan Note (Signed)
Stable. Takes Omeprazole 20mg 

## 2014-10-28 NOTE — Assessment & Plan Note (Signed)
Lateral right leg-no s/s infection-protective measures-developing into cellulitis.

## 2014-10-28 NOTE — Assessment & Plan Note (Signed)
Atrial fibrillation remained rate controlled. Patient was placed on Eliquis per neurology's recommendations. event. EKG normal sinus rhythm  

## 2014-10-28 NOTE — Assessment & Plan Note (Signed)
08/26/14 changed Tylenol 650mg  bid to q4h prn.

## 2014-10-28 NOTE — Assessment & Plan Note (Signed)
Doxycycline 100mg  bid and [facility] bid x 14 days. Observe.

## 2014-10-28 NOTE — Progress Notes (Signed)
Patient ID: [name], male   DOB: 1919/06/20, 80 y.o.   MRN: [id number]   Code Status: DNR  Allergies  Allergen Reactions  . Sulfonamide Derivatives Rash    REACTION: rash    Chief Complaint  Patient presents with  . Medical Management of Chronic Issues  . Acute Visit    RLL cellulitis.     HPI: Patient is a 80 y.o. male seen in the SNF at [facility] today for evaluation of RLE cellulitis and chronic medical conditions.     Hospitalized from 02/02/2014 to 02/06/2014 for Acute ischemic stroke, embolicwith left upper extremity weakness and oral pharyngeal dysphagia.   Problem List Items Addressed This Visit    Traumatic hematoma of right lower leg    Lateral right leg-no s/s infection-protective measures-developing into cellulitis.      Osteoarthritis of hand, left    08/26/14 changed Tylenol 650mg  bid to q4h prn.        Long term current use of anticoagulant    Eliquis since the past CVA 01/2014      GERD    Stable. Takes Omeprazole 20mg       Essential hypertension    Controlled, takes Metoprolol 25mg  bid.        CVA (cerebral vascular accident)    Right sided weakness with right wrist/hand contracture. Grip strength is still 5/5. Ambulates with walker and w/c to go further. New onset of decreased the left sided coordination 01/2014. C/o worsened in his right sided weakness and stiffness in his left side. Continue PT      Constipation    Stable, takes prn Dulcolax suppository q12hr. Usually BM daily         Cellulitis of leg, right - Primary    Doxycycline 100mg  bid and FloraStor bid x 14 days. Observe.     Atrial fibrillation    Atrial fibrillation remained rate controlled. Patient was placed on Eliquis per neurology's recommendations. event. EKG normal sinus rhythm          Review of Systems:  Review of Systems  Constitutional: Negative for fever, chills, weight loss, malaise/fatigue and diaphoresis.       Generalized weakness and  the right sided weakness from previous CVA. New left sided weakness from the recent CVA.   HENT: Positive for hearing loss. Negative for congestion, ear discharge, ear pain, nosebleeds, sore throat and tinnitus.   Eyes: Negative for blurred vision, double vision, photophobia, pain, discharge and redness.       Left eye blindness  Respiratory: Negative for cough, hemoptysis, sputum production, shortness of breath, wheezing and stridor.   Cardiovascular: Positive for leg swelling. Negative for chest pain, palpitations, orthopnea, claudication and PND.       Trace pedal edema R>L  Gastrointestinal: Negative for heartburn, nausea, vomiting, abdominal pain, diarrhea, constipation, blood in stool and melena.  Genitourinary: Negative for dysuria, urgency, frequency, hematuria and flank pain.  Musculoskeletal: Positive for joint pain. Negative for myalgias, back pain, falls and neck pain.       W/c for mobility due to all limbs weakness from hx of CVA and recent new CVA. Left hand joints aches and stiffness.   Skin: Negative for itching and rash.       Right cholecystostomy surgical scar. The medial left lower leg surgical scar from previous Melanoma removal. A palm sized patchy itching area ulnar aspect of the left upper arm. Lots of moles. A hematoma right lateral leg. Now warmth, painful, swelling RLE. Upper  lateral RLe bruise.   Neurological: Positive for focal weakness and weakness. Negative for dizziness, tingling, tremors, sensory change, speech change, seizures, loss of consciousness and headaches.       Right sided weakness from previous CVA even if grip strength is 5/5. The right wrist and hand contracture. Ambulates with walker and w/c to go further. C/o progressive weakness noted in his RUE. New onset of the left sided decreased coordination-cannot transfer self independently and dropped his utensil. Left facial weakness from previous CVA.   Endo/Heme/Allergies: Negative for environmental  allergies and polydipsia. Bruises/bleeds easily.  Psychiatric/Behavioral: Negative for depression, suicidal ideas, hallucinations, memory loss and substance abuse. The patient is not nervous/anxious and does not have insomnia.    Past Medical History  Diagnosis Date  . GERD (gastroesophageal reflux disease)   . Hypertension   . Hearing loss     wears hearing aid - right side  . Leg swelling   . Dysphagia   . Bruises easily     due to coumadin  . Weakness     difficulty walking  . Contact lens/glasses fitting   . Stroke 1999, 02/02/14  . Cancer   . 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 Gait disturbance   . COPD (chronic obstructive pulmonary disease)   . Peripheral vascular disease   . History of melanoma   . Diverticulosis   . Abnormality of gait 05/16/2013  . Choledocholithiasis with obstruction 06/28/13  . Embolic stroke 8/46/65  . PAF (paroxysmal atrial fibrillation)   . Hyperlipidemia   . Bradycardia   . Protein-calorie malnutrition, severe   . Ventricular tachycardia (paroxysmal)   . Obstructive sleep apnea of adult     uses cpap, pt does not know setting  . Personal history of fall 05/29/2013  . Fracture of femoral neck, right, closed 3114  . Long term (current) use of anticoagulants     PAF and embolic CVA  . Anemia, unspecified 07/05/2013  . Xerophthalmia 07/05/2013  . 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 Actinic keratosis   . Seborrheic keratosis   . Aortic valve stenosis   . Cerebrovascular disease   . Elevated liver enzymes   . Diverticulosis   . Herpes zoster 02/08/14    Left facial nerve distribution  . Right hemiparesis   . Melanoma 2009  . Hyperglycemia   . Xerophthalmia    Past Surgical History  Procedure Laterality Date  . Excision of melanoma  2009    left leg  . Melanoma excision      many melanoma removed in past  . Melanoma excision  10/11/2011    Procedure: MELANOMA EXCISION;  Surgeon: Pedro Earls, MD;  Location: [facility];  Service: General;  Laterality: Left;   EXCISION melanoma left leg with full thickness skin grafting from left lower abdomen.  . Joint replacement  2012    r fem head fx  . Lung benign area removed   1970's  . Hernia repair  1983  . Melanoma excision  05/16/2012    Procedure: MELANOMA EXCISION;  Surgeon: Pedro Earls, MD;  Location: [facility];  Service: General;  Laterality: Left;  Nodule Removal of Melanoma on Left Shin  . Ercp N/A 07/04/2013    Procedure: ENDOSCOPIC RETROGRADE CHOLANGIOPANCREATOGRAPHY (ERCP);  Surgeon: Irene Shipper, MD;  Location: [facility];  Service: Endoscopy;  Laterality: N/A;   Social History:   reports that he quit smoking about 50 years ago. His smoking use included Pipe. He has never used  smokeless tobacco. He reports that he does not drink alcohol or use illicit drugs.  Family History  Problem Relation Age of Onset  . Heart disease Father 13  . Cancer Son     prostate  . Dementia Sister     One sister has dementia    Medications: Patient's Medications  New Prescriptions   No medications on file  Previous Medications   ACETAMINOPHEN (TYLENOL) 325 MG TABLET    Take 650 mg by mouth every 6 (six) hours as needed for pain.    ALUM & MAG HYDROXIDE-SIMETH (MINTOX) 614-431-54 MG/5ML SUSPENSION    Take 30 mLs by mouth every 6 (six) hours as needed for indigestion or heartburn.   APIXABAN (ELIQUIS) 5 MG TABS TABLET    Take 1 tablet (5 mg total) by mouth 2 (two) times daily.   BISACODYL (DULCOLAX) 10 MG SUPPOSITORY    Place 10 mg rectally every 12 (twelve) hours as needed for moderate constipation.   DOXYCYCLINE (VIBRAMYCIN) 100 MG CAPSULE       FUROSEMIDE (LASIX) 20 MG TABLET       HYDROXYPROPYL METHYLCELLULOSE (ISOPTO TEARS) 2.5 % OPHTHALMIC SOLUTION    Place 1 drop into both eyes 4 (four) times daily as needed (eye irritation).    METOPROLOL TARTRATE (LOPRESSOR) 25 MG TABLET    Take 25 mg by mouth 2 (two) times daily.   OMEPRAZOLE (PRILOSEC) 20 MG CAPSULE    Take 20 mg by mouth every morning.     POLYETHYLENE GLYCOL (MIRALAX / GLYCOLAX) PACKET    Take 17 g by mouth every other day.   WARFARIN (COUMADIN) 4 MG TABLET       WHITE PETROLATUM-MINERAL OIL (SYSTANE NIGHTTIME) OINT    Place 1 application into both eyes at bedtime.  Modified Medications   No medications on file  Discontinued Medications   No medications on file     Physical Exam: Physical Exam  Constitutional: He is oriented to person, place, and time. He appears well-developed and well-nourished. No distress.  HENT:  Head: Normocephalic and atraumatic.  Right Ear: External ear normal.  Left Ear: External ear normal.  Nose: Nose normal.  Mouth/Throat: Oropharynx is clear and moist. No oropharyngeal exudate.  Eyes: Conjunctivae are normal. Pupils are equal, round, and reactive to light. Right eye exhibits no discharge. No scleral icterus.  Left eye blindness. The right lower eyelid ectropion.   Neck: Normal range of motion. Neck supple. No JVD present. No tracheal deviation present. No thyromegaly present.  Cardiovascular: Normal rate and normal heart sounds.  An irregular rhythm present.  Pulmonary/Chest: Effort normal and breath sounds normal. No stridor. No respiratory distress. He has no wheezes. He has no rales. He exhibits no tenderness.  Abdominal: He exhibits no distension. There is no tenderness. There is no rebound and no guarding.  Musculoskeletal: Normal range of motion. He exhibits edema. He exhibits no tenderness.  The right sided weakness since previous CVA-muscle strength is still 5/5. The right wrist and hand contractures-able to make fist and cannot extend fully. No longer ambulates with walker and w/c for mobility Trace pedal edema R>L Left sided weakness from the recent CVA-mild-grip strength 5/5  Lymphadenopathy:    He has no cervical adenopathy.  Neurological: He is alert and oriented to person, place, and time. He displays abnormal reflex. No cranial nerve deficit. Coordination normal.  New onset of  the left sided decreased coordination-cannot transfer self independently and dropped his utensil, grip strength is still 5/5. Hx of  right sided weakness with R hand contracture. Left facial weakness.    Skin: Skin is warm and dry. No rash noted. He is not diaphoretic. No erythema. No pallor.  he right cholecystostomy surgical scar. The medial left lower leg surgical scar from previous melanoma removal. A palm sized patchy itching area ulnar aspect of the left upper arm. R lateral leg hematoma. RLE 1/3 lower leg erythema, warmth, tenderness, and swelling where hematoma originated. Upper lateral RLE bruise.    Psychiatric: Thought content normal. His mood appears anxious. His affect is not angry and not inappropriate. His speech is not rapid and/or pressured, not delayed and not slurred. He is agitated. He is not aggressive, not hyperactive, not slowed and not withdrawn. Cognition and memory are impaired. He expresses impulsivity and inappropriate judgment. He does not exhibit a depressed mood. He exhibits abnormal recent memory. He exhibits normal remote memory.   (type .physexam) Filed Vitals:   10/28/14 1702  BP: 160/80  Pulse: 72  Temp: 97.4 F (36.3 C)  TempSrc: Tympanic  Resp: 20      Labs reviewed: Basic Metabolic Panel:  Recent Labs  02/02/14 1910 02/04/14 0534 04/12/14  NA 137 142 138  K 4.3 4.2 5.1  CL 100 107  --   CO2 22 25  --   GLUCOSE 135* 90  --   BUN 29* 32* 32*  CREATININE 1.22 1.31 1.1  CALCIUM 8.4 8.1*  --    Liver Function Tests:  Recent Labs  02/01/14 02/04/14 0534 04/12/14  AST 11* 143* 12*  ALT 8* 194* 8*  ALKPHOS 96 234* 105  BILITOT  --  1.5*  --   PROT  --  5.7*  --   ALBUMIN  --  2.5*  --    No results for input(s): LIPASE, AMYLASE in the last 8760 hours. CBC:  Recent Labs  02/02/14 1910 02/04/14 0534 04/12/14  WBC 11.7* 12.6* 7.8  NEUTROABS 11.3*  --   --   HGB 11.1* 10.1* 12.0*  HCT 34.4* 32.0* 36*  MCV 90.5 92.0  --   PLT 226  194 268    Past Procedures:  02/01/14 CXR mild cardiomegaly without pulmonary vascular congestion or pleural effusion, old granulomatous disease, no inflammatory consolidate or suspicious nodule, small patchy ares of atelectasis or non-consolidated pneumonia are seen at the lung bases.   2D Echocardiogram  - Left ventricle: Wall thickness was increased in a pattern of mild LVH. Systolic function was normal. The estimated ejection fraction was in the range of 55% to 60%. - Aortic valve: There was mild stenosis.  - Left atrium: The atrium was moderately dilated. - Atrial septum: No defect or patent foramen ovale was identified.   MRI did in fact show Small acute left lateral medullary and left anterior frontal lobe (pre motor/Broca's area) infarcts. No associated mass effect. Petechial hemorrhage versus chronic micro hemorrhage in the left frontal lobe lesion, but no malignant hemorrhagic transformation. 2. Decreased flow or occlusion of the distal left vertebral artery corresponding to the lateral medullary infarct. Left MCA M1 and branch irregularity with no focal stenosis or major branch occlusion.   Assessment/Plan Cellulitis of leg, right Doxycycline 100mg  bid and FloraStor bid x 14 days. Observe.   Atrial fibrillation Atrial fibrillation remained rate controlled. Patient was placed on Eliquis per neurology's recommendations. event. EKG normal sinus rhythm     Constipation Stable, takes prn Dulcolax suppository q12hr. Usually BM daily       CVA (cerebral vascular accident)  Right sided weakness with right wrist/hand contracture. Grip strength is still 5/5. Ambulates with walker and w/c to go further. New onset of decreased the left sided coordination 01/2014. C/o worsened in his right sided weakness and stiffness in his left side. Continue PT    Essential hypertension Controlled, takes Metoprolol 25mg  bid.      GERD Stable. Takes Omeprazole 20mg     Long term current use  of anticoagulant Eliquis since the past CVA 01/2014    Traumatic hematoma of right lower leg Lateral right leg-no s/s infection-protective measures-developing into cellulitis.    Osteoarthritis of hand, left 08/26/14 changed Tylenol 650mg  bid to q4h prn.        Family/ Staff Communication: observe the patient.   Goals of Care: SNF  Labs/tests ordered: none

## 2014-10-28 NOTE — Assessment & Plan Note (Signed)
Stable, takes prn Dulcolax suppository q12hr. Usually BM daily

## 2014-10-29 ENCOUNTER — Encounter: Payer: Self-pay | Admitting: Neurology

## 2014-11-18 ENCOUNTER — Encounter: Payer: Self-pay | Admitting: Nurse Practitioner

## 2014-11-18 ENCOUNTER — Non-Acute Institutional Stay (SKILLED_NURSING_FACILITY): Payer: Medicare Other | Admitting: Nurse Practitioner

## 2014-11-18 DIAGNOSIS — I480 Paroxysmal atrial fibrillation: Secondary | ICD-10-CM

## 2014-11-18 DIAGNOSIS — I639 Cerebral infarction, unspecified: Secondary | ICD-10-CM

## 2014-11-18 DIAGNOSIS — Z7901 Long term (current) use of anticoagulants: Secondary | ICD-10-CM

## 2014-11-18 DIAGNOSIS — R21 Rash and other nonspecific skin eruption: Secondary | ICD-10-CM

## 2014-11-18 DIAGNOSIS — I10 Essential (primary) hypertension: Secondary | ICD-10-CM

## 2014-11-18 DIAGNOSIS — L03115 Cellulitis of right lower limb: Secondary | ICD-10-CM

## 2014-11-18 DIAGNOSIS — K219 Gastro-esophageal reflux disease without esophagitis: Secondary | ICD-10-CM

## 2014-11-18 NOTE — Assessment & Plan Note (Signed)
Healed and completed 14 day course of Doxy

## 2014-11-18 NOTE — Assessment & Plan Note (Signed)
Atrial fibrillation remained rate controlled. Patient was placed on Eliquis per neurology's recommendations. event. EKG normal sinus rhythm  

## 2014-11-18 NOTE — Assessment & Plan Note (Signed)
Stable. dc Omeprazole 20mg -refusal.

## 2014-11-18 NOTE — Assessment & Plan Note (Signed)
Eliquis since the past CVA 01/2014  

## 2014-11-18 NOTE — Progress Notes (Signed)
Patient ID: [name], male   DOB: June 21, 1919, 80 y.o.   MRN: [id number]   Code Status: DNR  Allergies  Allergen Reactions  . Sulfonamide Derivatives Rash    REACTION: rash    Chief Complaint  Patient presents with  . Medical Management of Chronic Issues  . Acute Visit    facial rash.     HPI: Patient is a 80 y.o. male seen in the SNF at [facility] today for evaluation of facial rash and chronic medical conditions.     Hospitalized from 02/02/2014 to 02/06/2014 for Acute ischemic stroke, embolicwith left upper extremity weakness and oral pharyngeal dysphagia.   Problem List Items Addressed This Visit    Rash and nonspecific skin eruption    Diffused facial rash-mild warmth and denied itching-improved since Hydrocortisone topically. Continue to observe.     Long term current use of anticoagulant    Eliquis since the past CVA 01/2014      GERD - Primary    Stable. dc Omeprazole 20mg -refusal.       Essential hypertension    Controlled, takes Metoprolol 25mg  bid.      CVA (cerebral vascular accident)    Right sided weakness with right wrist/hand contracture. Grip strength is still 5/5. Ambulates with walker and w/c to go further. New onset of decreased the left sided coordination 01/2014. C/o worsened in his right sided weakness and stiffness in his left side.       Cellulitis of leg, right    Healed and completed 14 day course of Doxy    Atrial fibrillation    Atrial fibrillation remained rate controlled. Patient was placed on Eliquis per neurology's recommendations. event. EKG normal sinus rhythm           Review of Systems:  Review of Systems  Constitutional: Negative for fever, chills, weight loss, malaise/fatigue and diaphoresis.       Generalized weakness and the right sided weakness from previous CVA. New left sided weakness from the recent CVA.   HENT: Positive for hearing loss. Negative for congestion, ear discharge, ear pain, nosebleeds,  sore throat and tinnitus.   Eyes: Negative for blurred vision, double vision, photophobia, pain, discharge and redness.       Left eye blindness  Respiratory: Negative for cough, hemoptysis, sputum production, shortness of breath, wheezing and stridor.   Cardiovascular: Positive for leg swelling. Negative for chest pain, palpitations, orthopnea, claudication and PND.       Trace pedal edema R>L  Gastrointestinal: Negative for heartburn, nausea, vomiting, abdominal pain, diarrhea, constipation, blood in stool and melena.  Genitourinary: Negative for dysuria, urgency, frequency, hematuria and flank pain.  Musculoskeletal: Positive for joint pain. Negative for myalgias, back pain, falls and neck pain.       W/c for mobility due to all limbs weakness from hx of CVA and recent new CVA. Left hand joints aches and stiffness.   Skin: Negative for itching and rash.       Right cholecystostomy surgical scar. The medial left lower leg surgical scar from previous Melanoma removal. A palm sized patchy itching area ulnar aspect of the left upper arm. Lots of moles. A hematoma right lateral leg. Now warmth, painful, swelling RLE. Upper lateral RLe bruise.   Neurological: Positive for focal weakness and weakness. Negative for dizziness, tingling, tremors, sensory change, speech change, seizures, loss of consciousness and headaches.       Right sided weakness from previous CVA even if grip strength is  5/5. The right wrist and hand contracture. Ambulates with walker and w/c to go further. C/o progressive weakness noted in his RUE. New onset of the left sided decreased coordination-cannot transfer self independently and dropped his utensil. Left facial weakness from previous CVA.   Endo/Heme/Allergies: Negative for environmental allergies and polydipsia. Bruises/bleeds easily.  Psychiatric/Behavioral: Negative for depression, suicidal ideas, hallucinations, memory loss and substance abuse. The patient is not  nervous/anxious and does not have insomnia.    Past Medical History  Diagnosis Date  . GERD (gastroesophageal reflux disease)   . Hypertension   . Hearing loss     wears hearing aid - right side  . Leg swelling   . Dysphagia   . Bruises easily     due to coumadin  . Weakness     difficulty walking  . Contact lens/glasses fitting   . Stroke 1999, 02/02/14  . Cancer   . 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 Gait disturbance   . COPD (chronic obstructive pulmonary disease)   . Peripheral vascular disease   . History of melanoma   . Diverticulosis   . Abnormality of gait 05/16/2013  . Choledocholithiasis with obstruction 06/28/13  . Embolic stroke 0/17/79  . PAF (paroxysmal atrial fibrillation)   . Hyperlipidemia   . Bradycardia   . Protein-calorie malnutrition, severe   . Ventricular tachycardia (paroxysmal)   . Obstructive sleep apnea of adult     uses cpap, pt does not know setting  . Personal history of fall 05/29/2013  . Fracture of femoral neck, right, closed 3114  . Long term (current) use of anticoagulants     PAF and embolic CVA  . Anemia, unspecified 07/05/2013  . Xerophthalmia 07/05/2013  . 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 Actinic keratosis   . Seborrheic keratosis   . Aortic valve stenosis   . Cerebrovascular disease   . Elevated liver enzymes   . Diverticulosis   . Herpes zoster 02/08/14    Left facial nerve distribution  . Right hemiparesis   . Melanoma 2009  . Hyperglycemia   . Xerophthalmia    Past Surgical History  Procedure Laterality Date  . Excision of melanoma  2009    left leg  . Melanoma excision      many melanoma removed in past  . Melanoma excision  10/11/2011    Procedure: MELANOMA EXCISION;  Surgeon: Pedro Earls, MD;  Location: [facility];  Service: General;  Laterality: Left;  EXCISION melanoma left leg with full thickness skin grafting from left lower abdomen.  . Joint replacement  2012    r fem head fx  . Lung benign area removed   1970's  . Hernia  repair  1983  . Melanoma excision  05/16/2012    Procedure: MELANOMA EXCISION;  Surgeon: Pedro Earls, MD;  Location: [facility];  Service: General;  Laterality: Left;  Nodule Removal of Melanoma on Left Shin  . Ercp N/A 07/04/2013    Procedure: ENDOSCOPIC RETROGRADE CHOLANGIOPANCREATOGRAPHY (ERCP);  Surgeon: Irene Shipper, MD;  Location: [facility];  Service: Endoscopy;  Laterality: N/A;   Social History:   reports that he quit smoking about 50 years ago. His smoking use included Pipe. He has never used smokeless tobacco. He reports that he does not drink alcohol or use illicit drugs.  Family History  Problem Relation Age of Onset  . Heart disease Father 51  . Cancer Son     prostate  . Dementia Sister  One sister has dementia    Medications: Patient's Medications  New Prescriptions   No medications on file  Previous Medications   ACETAMINOPHEN (TYLENOL) 325 MG TABLET    Take 650 mg by mouth every 6 (six) hours as needed for pain.    ALUM & MAG HYDROXIDE-SIMETH (MINTOX) 623-762-83 MG/5ML SUSPENSION    Take 30 mLs by mouth every 6 (six) hours as needed for indigestion or heartburn.   APIXABAN (ELIQUIS) 5 MG TABS TABLET    Take 1 tablet (5 mg total) by mouth 2 (two) times daily.   BISACODYL (DULCOLAX) 10 MG SUPPOSITORY    Place 10 mg rectally every 12 (twelve) hours as needed for moderate constipation.   DOXYCYCLINE (VIBRAMYCIN) 100 MG CAPSULE       FUROSEMIDE (LASIX) 20 MG TABLET       HYDROXYPROPYL METHYLCELLULOSE (ISOPTO TEARS) 2.5 % OPHTHALMIC SOLUTION    Place 1 drop into both eyes 4 (four) times daily as needed (eye irritation).    METOPROLOL TARTRATE (LOPRESSOR) 25 MG TABLET    Take 25 mg by mouth 2 (two) times daily.   OMEPRAZOLE (PRILOSEC) 20 MG CAPSULE    Take 20 mg by mouth every morning.    POLYETHYLENE GLYCOL (MIRALAX / GLYCOLAX) PACKET    Take 17 g by mouth every other day.   WARFARIN (COUMADIN) 4 MG TABLET       WHITE PETROLATUM-MINERAL OIL (SYSTANE NIGHTTIME) OINT     Place 1 application into both eyes at bedtime.  Modified Medications   No medications on file  Discontinued Medications   No medications on file     Physical Exam: Physical Exam  Constitutional: He is oriented to person, place, and time. He appears well-developed and well-nourished. No distress.  HENT:  Head: Normocephalic and atraumatic.  Right Ear: External ear normal.  Left Ear: External ear normal.  Nose: Nose normal.  Mouth/Throat: Oropharynx is clear and moist. No oropharyngeal exudate.  Eyes: Conjunctivae are normal. Pupils are equal, round, and reactive to light. Right eye exhibits no discharge. No scleral icterus.  Left eye blindness. The right lower eyelid ectropion.   Neck: Normal range of motion. Neck supple. No JVD present. No tracheal deviation present. No thyromegaly present.  Cardiovascular: Normal rate and normal heart sounds.  An irregular rhythm present.  Pulmonary/Chest: Effort normal and breath sounds normal. No stridor. No respiratory distress. He has no wheezes. He has no rales. He exhibits no tenderness.  Abdominal: He exhibits no distension. There is no tenderness. There is no rebound and no guarding.  Musculoskeletal: Normal range of motion. He exhibits edema. He exhibits no tenderness.  The right sided weakness since previous CVA-muscle strength is still 5/5. The right wrist and hand contractures-able to make fist and cannot extend fully. No longer ambulates with walker and w/c for mobility Trace pedal edema R>L Left sided weakness from the recent CVA-mild-grip strength 5/5  Lymphadenopathy:    He has no cervical adenopathy.  Neurological: He is alert and oriented to person, place, and time. He displays abnormal reflex. No cranial nerve deficit. Coordination normal.  New onset of the left sided decreased coordination-cannot transfer self independently and dropped his utensil, grip strength is still 5/5. Hx of right sided weakness with R hand contracture. Left  facial weakness.    Skin: Skin is warm and dry. No rash noted. He is not diaphoretic. No erythema. No pallor.  he right cholecystostomy surgical scar. The medial left lower leg surgical scar from previous melanoma  removal. A palm sized patchy itching area ulnar aspect of the left upper arm. R lateral leg hematoma. RLE 1/3 lower leg erythema, warmth, tenderness, and swelling where hematoma originated. Upper lateral RLE bruise.    Psychiatric: Thought content normal. His mood appears anxious. His affect is not angry and not inappropriate. His speech is not rapid and/or pressured, not delayed and not slurred. He is agitated. He is not aggressive, not hyperactive, not slowed and not withdrawn. Cognition and memory are impaired. He expresses impulsivity and inappropriate judgment. He does not exhibit a depressed mood. He exhibits abnormal recent memory. He exhibits normal remote memory.   (type .physexam) Filed Vitals:   11/18/14 1415  BP: 132/70  Pulse: 56  Temp: 97.2 F (36.2 C)  TempSrc: Tympanic  Resp: 20      Labs reviewed: Basic Metabolic Panel:  Recent Labs  02/02/14 1910 02/04/14 0534 04/12/14  NA 137 142 138  K 4.3 4.2 5.1  CL 100 107  --   CO2 22 25  --   GLUCOSE 135* 90  --   BUN 29* 32* 32*  CREATININE 1.22 1.31 1.1  CALCIUM 8.4 8.1*  --    Liver Function Tests:  Recent Labs  02/01/14 02/04/14 0534 04/12/14  AST 11* 143* 12*  ALT 8* 194* 8*  ALKPHOS 96 234* 105  BILITOT  --  1.5*  --   PROT  --  5.7*  --   ALBUMIN  --  2.5*  --    No results for input(s): LIPASE, AMYLASE in the last 8760 hours. CBC:  Recent Labs  02/02/14 1910 02/04/14 0534 04/12/14  WBC 11.7* 12.6* 7.8  NEUTROABS 11.3*  --   --   HGB 11.1* 10.1* 12.0*  HCT 34.4* 32.0* 36*  MCV 90.5 92.0  --   PLT 226 194 268    Past Procedures:  02/01/14 CXR mild cardiomegaly without pulmonary vascular congestion or pleural effusion, old granulomatous disease, no inflammatory consolidate or  suspicious nodule, small patchy ares of atelectasis or non-consolidated pneumonia are seen at the lung bases.   2D Echocardiogram  - Left ventricle: Wall thickness was increased in a pattern of mild LVH. Systolic function was normal. The estimated ejection fraction was in the range of 55% to 60%. - Aortic valve: There was mild stenosis.  - Left atrium: The atrium was moderately dilated. - Atrial septum: No defect or patent foramen ovale was identified.   MRI did in fact show Small acute left lateral medullary and left anterior frontal lobe (pre motor/Broca's area) infarcts. No associated mass effect. Petechial hemorrhage versus chronic micro hemorrhage in the left frontal lobe lesion, but no malignant hemorrhagic transformation. 2. Decreased flow or occlusion of the distal left vertebral artery corresponding to the lateral medullary infarct. Left MCA M1 and branch irregularity with no focal stenosis or major branch occlusion.   Assessment/Plan GERD Stable. dc Omeprazole 20mg -refusal.     Rash and nonspecific skin eruption Diffused facial rash-mild warmth and denied itching-improved since Hydrocortisone topically. Continue to observe.   CVA (cerebral vascular accident) Right sided weakness with right wrist/hand contracture. Grip strength is still 5/5. Ambulates with walker and w/c to go further. New onset of decreased the left sided coordination 01/2014. C/o worsened in his right sided weakness and stiffness in his left side.     Essential hypertension Controlled, takes Metoprolol 25mg  bid.    Cellulitis of leg, right Healed and completed 14 day course of Doxy  Atrial fibrillation Atrial fibrillation  remained rate controlled. Patient was placed on Eliquis per neurology's recommendations. event. EKG normal sinus rhythm      Long term current use of anticoagulant Eliquis since the past CVA 01/2014      Family/ Staff Communication: observe the patient.   Goals of Care:  SNF  Labs/tests ordered: none

## 2014-11-18 NOTE — Assessment & Plan Note (Signed)
Diffused facial rash-mild warmth and denied itching-improved since Hydrocortisone topically. Continue to observe.

## 2014-11-18 NOTE — Assessment & Plan Note (Signed)
Right sided weakness with right wrist/hand contracture. Grip strength is still 5/5. Ambulates with walker and w/c to go further. New onset of decreased the left sided coordination 01/2014. C/o worsened in his right sided weakness and stiffness in his left side.   

## 2014-11-18 NOTE — Assessment & Plan Note (Signed)
Controlled, takes Metoprolol 25mg bid.   

## 2014-12-25 ENCOUNTER — Non-Acute Institutional Stay (SKILLED_NURSING_FACILITY): Payer: Medicare Other | Admitting: Nurse Practitioner

## 2014-12-25 ENCOUNTER — Encounter: Payer: Self-pay | Admitting: Nurse Practitioner

## 2014-12-25 DIAGNOSIS — I10 Essential (primary) hypertension: Secondary | ICD-10-CM

## 2014-12-25 DIAGNOSIS — I6350 Cerebral infarction due to unspecified occlusion or stenosis of unspecified cerebral artery: Secondary | ICD-10-CM

## 2014-12-25 DIAGNOSIS — I639 Cerebral infarction, unspecified: Secondary | ICD-10-CM

## 2014-12-25 DIAGNOSIS — I480 Paroxysmal atrial fibrillation: Secondary | ICD-10-CM

## 2014-12-25 DIAGNOSIS — K219 Gastro-esophageal reflux disease without esophagitis: Secondary | ICD-10-CM

## 2014-12-25 DIAGNOSIS — Z7901 Long term (current) use of anticoagulants: Secondary | ICD-10-CM

## 2014-12-25 NOTE — Assessment & Plan Note (Signed)
Controlled, takes Metoprolol 25mg  bid.

## 2014-12-25 NOTE — Assessment & Plan Note (Signed)
Stable. off Omeprazole 20mg-refusal. 

## 2014-12-25 NOTE — Assessment & Plan Note (Signed)
Eliquis since the CVA 01/2014

## 2014-12-25 NOTE — Progress Notes (Signed)
Patient ID: [name], male   DOB: 1919/10/27, 81 y.o.   MRN: [id number]   Code Status: DNR  Allergies  Allergen Reactions  . Sulfonamide Derivatives Rash    REACTION: rash    Chief Complaint  Patient presents with  . Medical Management of Chronic Issues    HPI: Patient is a 81 y.o. male seen in the SNF at [facility] today for evaluation of chronic medical conditions.     Hospitalized from 02/02/2014 to 02/06/2014 for Acute ischemic stroke, embolicwith left upper extremity weakness and oral pharyngeal dysphagia.   Problem List Items Addressed This Visit    Long term current use of anticoagulant - Primary    Eliquis since the CVA 01/2014        GERD    Stable. off Omeprazole 20mg -refusal.        Essential hypertension    Controlled, takes Metoprolol 25mg  bid.        Atrial fibrillation    Atrial fibrillation remained rate controlled. Patient was placed on Eliquis per neurology's recommendations. event. EKG normal sinus rhythm          Acute ischemic stroke    Hx of. Left sided. Residuals: w/c dependent, left sided weakness noted even muscle strength 5/5, gradual LUE/LLE stiffness and joint pains. Resulted in SNF for care needs.          Review of Systems:  Review of Systems  Constitutional: Negative for fever, chills, weight loss, malaise/fatigue and diaphoresis.  HENT: Positive for hearing loss. Negative for congestion, ear discharge, ear pain, nosebleeds, sore throat and tinnitus.   Eyes: Negative for blurred vision, double vision, photophobia, pain, discharge and redness.  Respiratory: Negative for cough, hemoptysis, sputum production, shortness of breath, wheezing and stridor.   Cardiovascular: Positive for leg swelling. Negative for chest pain, palpitations, orthopnea, claudication and PND.       Trace  Gastrointestinal: Negative for heartburn, nausea, vomiting, abdominal pain, diarrhea, constipation, blood in stool and melena.    Genitourinary: Positive for frequency. Negative for dysuria, urgency, hematuria and flank pain.  Musculoskeletal: Positive for joint pain. Negative for myalgias, back pain, falls and neck pain.       Multiple joints  Skin: Positive for itching. Negative for rash.       Chronic dry skin  Neurological: Positive for sensory change, speech change and focal weakness. Negative for dizziness, tingling, tremors, seizures, loss of consciousness, weakness and headaches.       R sided weakness from previous CVA  Endo/Heme/Allergies: Negative for environmental allergies and polydipsia. Does not bruise/bleed easily.  Psychiatric/Behavioral: Positive for memory loss. Negative for depression, suicidal ideas, hallucinations and substance abuse. The patient is nervous/anxious. The patient does not have insomnia.    Past Medical History  Diagnosis Date  . GERD (gastroesophageal reflux disease)   . Hypertension   . Hearing loss     wears hearing aid - right side  . Leg swelling   . Dysphagia   . Bruises easily     due to coumadin  . Weakness     difficulty walking  . Contact lens/glasses fitting   . Stroke 1999, 02/02/14  . Cancer   . 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 Gait disturbance   . COPD (chronic obstructive pulmonary disease)   . Peripheral vascular disease   . History of melanoma   . Diverticulosis   . Abnormality of gait 05/16/2013  . Choledocholithiasis with obstruction 06/28/13  . Embolic stroke 06/16/44  .  PAF (paroxysmal atrial fibrillation)   . Hyperlipidemia   . Bradycardia   . Protein-calorie malnutrition, severe   . Ventricular tachycardia (paroxysmal)   . Obstructive sleep apnea of adult     uses cpap, pt does not know setting  . Personal history of fall 05/29/2013  . Fracture of femoral neck, right, closed 3114  . Long term (current) use of anticoagulants     PAF and embolic CVA  . Anemia, unspecified 07/05/2013  . Xerophthalmia 07/05/2013  . 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 Actinic  keratosis   . Seborrheic keratosis   . Aortic valve stenosis   . Cerebrovascular disease   . Elevated liver enzymes   . Diverticulosis   . Herpes zoster 02/08/14    Left facial nerve distribution  . Right hemiparesis   . Melanoma 2009  . Hyperglycemia   . Xerophthalmia    Past Surgical History  Procedure Laterality Date  . Excision of melanoma  2009    left leg  . Melanoma excision      many melanoma removed in past  . Melanoma excision  10/11/2011    Procedure: MELANOMA EXCISION;  Surgeon: Pedro Earls, MD;  Location: [place];  Service: General;  Laterality: Left;  EXCISION melanoma left leg with full thickness skin grafting from left lower abdomen.  . Joint replacement  2012    r fem head fx  . Lung benign area removed   1970's  . Hernia repair  1983  . Melanoma excision  05/16/2012    Procedure: MELANOMA EXCISION;  Surgeon: Pedro Earls, MD;  Location: [facility];  Service: General;  Laterality: Left;  Nodule Removal of Melanoma on Left Shin  . Ercp N/A 07/04/2013    Procedure: ENDOSCOPIC RETROGRADE CHOLANGIOPANCREATOGRAPHY (ERCP);  Surgeon: Irene Shipper, MD;  Location: [facility];  Service: Endoscopy;  Laterality: N/A;   Social History:   reports that he quit smoking about 50 years ago. His smoking use included Pipe. He has never used smokeless tobacco. He reports that he does not drink alcohol or use illicit drugs.  Family History  Problem Relation Age of Onset  . Heart disease Father 67  . Cancer Son     prostate  . Dementia Sister     One sister has dementia    Medications: Patient's Medications  New Prescriptions   No medications on file  Previous Medications   ACETAMINOPHEN (TYLENOL) 325 MG TABLET    Take 650 mg by mouth every 6 (six) hours as needed for pain.    ALUM & MAG HYDROXIDE-SIMETH (MINTOX) 161-096-04 MG/5ML SUSPENSION    Take 30 mLs by mouth every 6 (six) hours as needed for indigestion or heartburn.   APIXABAN (ELIQUIS) 5 MG TABS TABLET    Take 1  tablet (5 mg total) by mouth 2 (two) times daily.   BISACODYL (DULCOLAX) 10 MG SUPPOSITORY    Place 10 mg rectally every 12 (twelve) hours as needed for moderate constipation.   DOXYCYCLINE (VIBRAMYCIN) 100 MG CAPSULE       FUROSEMIDE (LASIX) 20 MG TABLET       HYDROXYPROPYL METHYLCELLULOSE (ISOPTO TEARS) 2.5 % OPHTHALMIC SOLUTION    Place 1 drop into both eyes 4 (four) times daily as needed (eye irritation).    METOPROLOL TARTRATE (LOPRESSOR) 25 MG TABLET    Take 25 mg by mouth 2 (two) times daily.   OMEPRAZOLE (PRILOSEC) 20 MG CAPSULE    Take 20 mg by mouth every morning.  POLYETHYLENE GLYCOL (MIRALAX / GLYCOLAX) PACKET    Take 17 g by mouth every other day.   WARFARIN (COUMADIN) 4 MG TABLET       WHITE PETROLATUM-MINERAL OIL (SYSTANE NIGHTTIME) OINT    Place 1 application into both eyes at bedtime.  Modified Medications   No medications on file  Discontinued Medications   No medications on file     Physical Exam: Physical Exam  Constitutional: He is oriented to person, place, and time. He appears well-developed and well-nourished. No distress.  HENT:  Head: Normocephalic and atraumatic.  Right Ear: External ear normal.  Left Ear: External ear normal.  Nose: Nose normal.  Mouth/Throat: Oropharynx is clear and moist. No oropharyngeal exudate.  Eyes: Conjunctivae are normal. Pupils are equal, round, and reactive to light. Right eye exhibits no discharge. No scleral icterus.  Left eye blindness. The right lower eyelid ectropion.   Neck: Normal range of motion. Neck supple. No JVD present. No tracheal deviation present. No thyromegaly present.  Cardiovascular: Normal rate and normal heart sounds.  An irregular rhythm present.  Pulmonary/Chest: Effort normal and breath sounds normal. No stridor. No respiratory distress. He has no wheezes. He has no rales. He exhibits no tenderness.  Abdominal: He exhibits no distension. There is no tenderness. There is no rebound and no guarding.    Musculoskeletal: Normal range of motion. He exhibits edema. He exhibits no tenderness.  The right sided weakness since previous CVA-muscle strength is still 5/5. The right wrist and hand contractures-able to make fist and cannot extend fully. No longer ambulates with walker and w/c for mobility Trace pedal edema R>L Left sided weakness from the recent CVA-mild-grip strength 5/5  Lymphadenopathy:    He has no cervical adenopathy.  Neurological: He is alert and oriented to person, place, and time. He displays abnormal reflex. No cranial nerve deficit. Coordination normal.  the left sided decreased coordination-cannot transfer self independently and dropped his utensil, grip strength is still 5/5. Hx of right sided weakness with R hand contracture. Left facial weakness.    Skin: Skin is warm and dry. No rash noted. He is not diaphoretic. No erythema. No pallor.  he right cholecystostomy surgical scar. The medial left lower leg surgical scar from previous melanoma removal.    Psychiatric: Thought content normal. His mood appears anxious. His affect is not angry and not inappropriate. His speech is not rapid and/or pressured, not delayed and not slurred. He is agitated. He is not aggressive, not hyperactive, not slowed and not withdrawn. Cognition and memory are impaired. He expresses impulsivity and inappropriate judgment. He does not exhibit a depressed mood. He exhibits abnormal recent memory. He exhibits normal remote memory.   (type .physexam) Filed Vitals:   12/25/14 1154  BP: 130/60  Pulse: 70  Temp: 97 F (36.1 C)  TempSrc: Tympanic  Resp: 20      Labs reviewed: Basic Metabolic Panel:  Recent Labs  02/02/14 1910 02/04/14 0534 04/12/14  NA 137 142 138  K 4.3 4.2 5.1  CL 100 107  --   CO2 22 25  --   GLUCOSE 135* 90  --   BUN 29* 32* 32*  CREATININE 1.22 1.31 1.1  CALCIUM 8.4 8.1*  --    Liver Function Tests:  Recent Labs  02/01/14 02/04/14 0534 04/12/14  AST 11* 143*  12*  ALT 8* 194* 8*  ALKPHOS 96 234* 105  BILITOT  --  1.5*  --   PROT  --  5.7*  --  ALBUMIN  --  2.5*  --    No results for input(s): LIPASE, AMYLASE in the last 8760 hours. CBC:  Recent Labs  02/02/14 1910 02/04/14 0534 04/12/14  WBC 11.7* 12.6* 7.8  NEUTROABS 11.3*  --   --   HGB 11.1* 10.1* 12.0*  HCT 34.4* 32.0* 36*  MCV 90.5 92.0  --   PLT 226 194 268    Past Procedures:  02/01/14 CXR mild cardiomegaly without pulmonary vascular congestion or pleural effusion, old granulomatous disease, no inflammatory consolidate or suspicious nodule, small patchy ares of atelectasis or non-consolidated pneumonia are seen at the lung bases.   2D Echocardiogram  - Left ventricle: Wall thickness was increased in a pattern of mild LVH. Systolic function was normal. The estimated ejection fraction was in the range of 55% to 60%. - Aortic valve: There was mild stenosis.  - Left atrium: The atrium was moderately dilated. - Atrial septum: No defect or patent foramen ovale was identified.   MRI did in fact show Small acute left lateral medullary and left anterior frontal lobe (pre motor/Broca's area) infarcts. No associated mass effect. Petechial hemorrhage versus chronic micro hemorrhage in the left frontal lobe lesion, but no malignant hemorrhagic transformation. 2. Decreased flow or occlusion of the distal left vertebral artery corresponding to the lateral medullary infarct. Left MCA M1 and branch irregularity with no focal stenosis or major branch occlusion.   Assessment/Plan Long term current use of anticoagulant Eliquis since the CVA 01/2014     GERD Stable. off Omeprazole 20mg -refusal.     Atrial fibrillation Atrial fibrillation remained rate controlled. Patient was placed on Eliquis per neurology's recommendations. event. EKG normal sinus rhythm       Acute ischemic stroke Hx of. Left sided. Residuals: w/c dependent, left sided weakness noted even muscle strength 5/5,  gradual LUE/LLE stiffness and joint pains. Resulted in SNF for care needs.    Essential hypertension Controlled, takes Metoprolol 25mg  bid.       Family/ Staff Communication: observe the patient.   Goals of Care: SNF  Labs/tests ordered: none

## 2014-12-25 NOTE — Assessment & Plan Note (Signed)
Atrial fibrillation remained rate controlled. Patient was placed on Eliquis per neurology's recommendations. event. EKG normal sinus rhythm

## 2014-12-25 NOTE — Assessment & Plan Note (Signed)
Hx of. Left sided. Residuals: w/c dependent, left sided weakness noted even muscle strength 5/5, gradual LUE/LLE stiffness and joint pains. Resulted in SNF for care needs.

## 2015-01-27 ENCOUNTER — Encounter: Payer: Self-pay | Admitting: Nurse Practitioner

## 2015-01-27 ENCOUNTER — Non-Acute Institutional Stay (SKILLED_NURSING_FACILITY): Payer: Medicare Other | Admitting: Nurse Practitioner

## 2015-01-27 DIAGNOSIS — I10 Essential (primary) hypertension: Secondary | ICD-10-CM

## 2015-01-27 DIAGNOSIS — K219 Gastro-esophageal reflux disease without esophagitis: Secondary | ICD-10-CM

## 2015-01-27 DIAGNOSIS — I639 Cerebral infarction, unspecified: Secondary | ICD-10-CM

## 2015-01-27 DIAGNOSIS — Z7901 Long term (current) use of anticoagulants: Secondary | ICD-10-CM

## 2015-01-27 DIAGNOSIS — I482 Chronic atrial fibrillation, unspecified: Secondary | ICD-10-CM

## 2015-01-27 NOTE — Assessment & Plan Note (Signed)
Eliquis since the CVA 01/2014

## 2015-01-27 NOTE — Progress Notes (Signed)
Patient ID: [name], male   DOB: 1919-05-04, 81 y.o.   MRN: [id number]   Code Status: DNR  Allergies  Allergen Reactions  . Sulfonamide Derivatives Rash    REACTION: rash    Chief Complaint  Patient presents with  . Medical Management of Chronic Issues    HPI: Patient is a 81 y.o. male seen in the SNF at [facility] today for evaluation of chronic medical conditions.     Hospitalized from 02/02/2014 to 02/06/2014 for Acute ischemic stroke, embolicwith left upper extremity weakness and oral pharyngeal dysphagia.   Problem List Items Addressed This Visit    Long term current use of anticoagulant    Eliquis since the CVA 01/2014       GERD    Stable. off Omeprazole 20mg -refusal.         Essential hypertension    Controlled, takes Metoprolol 25mg  bid.        CVA (cerebral vascular accident)    Right sided weakness with right wrist/hand contracture. Grip strength is still 5/5. Ambulates with walker and w/c to go further. New onset of decreased the left sided coordination 01/2014. C/o worsened in his right sided weakness and stiffness in his left side.        Atrial fibrillation - Primary    Atrial fibrillation remained rate controlled. Patient was placed on Eliquis per neurology's recommendations. event. EKG normal sinus rhythm          Review of Systems:  Review of Systems  Constitutional: Negative for fever, chills, weight loss, malaise/fatigue and diaphoresis.  HENT: Positive for hearing loss. Negative for congestion, ear discharge, ear pain, nosebleeds, sore throat and tinnitus.   Eyes: Negative for blurred vision, double vision, photophobia, pain, discharge and redness.  Respiratory: Negative for cough, hemoptysis, sputum production, shortness of breath, wheezing and stridor.   Cardiovascular: Positive for leg swelling. Negative for chest pain, palpitations, orthopnea, claudication and PND.       Trace  Gastrointestinal: Negative for heartburn,  nausea, vomiting, abdominal pain, diarrhea, constipation, blood in stool and melena.  Genitourinary: Positive for frequency. Negative for dysuria, urgency, hematuria and flank pain.  Musculoskeletal: Positive for joint pain. Negative for myalgias, back pain, falls and neck pain.  Skin: Negative for itching and rash.       Dry scaly skin  Neurological: Negative for dizziness, tingling, tremors, sensory change, speech change, focal weakness, seizures, loss of consciousness, weakness and headaches.  Endo/Heme/Allergies: Negative for environmental allergies and polydipsia. Bruises/bleeds easily.  Psychiatric/Behavioral: Positive for depression and memory loss. Negative for suicidal ideas, hallucinations and substance abuse. The patient is nervous/anxious. The patient does not have insomnia.    Past Medical History  Diagnosis Date  . GERD (gastroesophageal reflux disease)   . Hypertension   . Hearing loss     wears hearing aid - right side  . Leg swelling   . Dysphagia   . Bruises easily     due to coumadin  . Weakness     difficulty walking  . Contact lens/glasses fitting   . Stroke 1999, 02/02/14  . Cancer   . 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 Gait disturbance   . COPD (chronic obstructive pulmonary disease)   . Peripheral vascular disease   . History of melanoma   . Diverticulosis   . Abnormality of gait 05/16/2013  . Choledocholithiasis with obstruction 06/28/13  . Embolic stroke 06/13/61  . PAF (paroxysmal atrial fibrillation)   . Hyperlipidemia   .  Bradycardia   . Protein-calorie malnutrition, severe   . Ventricular tachycardia (paroxysmal)   . Obstructive sleep apnea of adult     uses cpap, pt does not know setting  . Personal history of fall 05/29/2013  . Fracture of femoral neck, right, closed 3114  . Long term (current) use of anticoagulants     PAF and embolic CVA  . Anemia, unspecified 07/05/2013  . Xerophthalmia 07/05/2013  . 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 Actinic keratosis   .  Seborrheic keratosis   . Aortic valve stenosis   . Cerebrovascular disease   . Elevated liver enzymes   . Diverticulosis   . Herpes zoster 02/08/14    Left facial nerve distribution  . Right hemiparesis   . Melanoma 2009  . Hyperglycemia   . Xerophthalmia    Past Surgical History  Procedure Laterality Date  . Excision of melanoma  2009    left leg  . Melanoma excision      many melanoma removed in past  . Melanoma excision  10/11/2011    Procedure: MELANOMA EXCISION;  Surgeon: Pedro Earls, MD;  Location: [place];  Service: General;  Laterality: Left;  EXCISION melanoma left leg with full thickness skin grafting from left lower abdomen.  . Joint replacement  2012    r fem head fx  . Lung benign area removed   1970's  . Hernia repair  1983  . Melanoma excision  05/16/2012    Procedure: MELANOMA EXCISION;  Surgeon: Pedro Earls, MD;  Location: [facility];  Service: General;  Laterality: Left;  Nodule Removal of Melanoma on Left Shin  . Ercp N/A 07/04/2013    Procedure: ENDOSCOPIC RETROGRADE CHOLANGIOPANCREATOGRAPHY (ERCP);  Surgeon: Irene Shipper, MD;  Location: [facility];  Service: Endoscopy;  Laterality: N/A;   Social History:   reports that he quit smoking about 50 years ago. His smoking use included Pipe. He has never used smokeless tobacco. He reports that he does not drink alcohol or use illicit drugs.  Family History  Problem Relation Age of Onset  . Heart disease Father 23  . Cancer Son     prostate  . Dementia Sister     One sister has dementia    Medications: Patient's Medications  New Prescriptions   No medications on file  Previous Medications   ACETAMINOPHEN (TYLENOL) 325 MG TABLET    Take 650 mg by mouth every 6 (six) hours as needed for pain.    ALUM & MAG HYDROXIDE-SIMETH (MINTOX) 161-096-04 MG/5ML SUSPENSION    Take 30 mLs by mouth every 6 (six) hours as needed for indigestion or heartburn.   APIXABAN (ELIQUIS) 5 MG TABS TABLET    Take 1 tablet (5 mg  total) by mouth 2 (two) times daily.   BISACODYL (DULCOLAX) 10 MG SUPPOSITORY    Place 10 mg rectally every 12 (twelve) hours as needed for moderate constipation.   DOXYCYCLINE (VIBRAMYCIN) 100 MG CAPSULE       FUROSEMIDE (LASIX) 20 MG TABLET       HYDROXYPROPYL METHYLCELLULOSE (ISOPTO TEARS) 2.5 % OPHTHALMIC SOLUTION    Place 1 drop into both eyes 4 (four) times daily as needed (eye irritation).    METOPROLOL TARTRATE (LOPRESSOR) 25 MG TABLET    Take 25 mg by mouth 2 (two) times daily.   OMEPRAZOLE (PRILOSEC) 20 MG CAPSULE    Take 20 mg by mouth every morning.    POLYETHYLENE GLYCOL (MIRALAX / GLYCOLAX) PACKET  Take 17 g by mouth every other day.   WARFARIN (COUMADIN) 4 MG TABLET       WHITE PETROLATUM-MINERAL OIL (SYSTANE NIGHTTIME) OINT    Place 1 application into both eyes at bedtime.  Modified Medications   No medications on file  Discontinued Medications   No medications on file     Physical Exam: Physical Exam  Constitutional: He is oriented to person, place, and time. He appears well-developed and well-nourished. No distress.  HENT:  Head: Normocephalic and atraumatic.  Right Ear: External ear normal.  Left Ear: External ear normal.  Nose: Nose normal.  Mouth/Throat: Oropharynx is clear and moist. No oropharyngeal exudate.  Eyes: Conjunctivae are normal. Pupils are equal, round, and reactive to light. Right eye exhibits no discharge. No scleral icterus.  Left eye blindness. The right lower eyelid ectropion.   Neck: Normal range of motion. Neck supple. No JVD present. No tracheal deviation present. No thyromegaly present.  Cardiovascular: Normal rate and normal heart sounds.  An irregular rhythm present.  Pulmonary/Chest: Effort normal and breath sounds normal. No stridor. No respiratory distress. He has no wheezes. He has no rales. He exhibits no tenderness.  Abdominal: He exhibits no distension. There is no tenderness. There is no rebound and no guarding.  Musculoskeletal:  Normal range of motion. He exhibits edema. He exhibits no tenderness.  The right sided weakness since previous CVA-muscle strength is still 5/5. The right wrist and hand contractures-able to make fist and cannot extend fully. No longer ambulates with walker and w/c for mobility Trace pedal edema R>L Left sided weakness from the recent CVA-mild-grip strength 5/5  Lymphadenopathy:    He has no cervical adenopathy.  Neurological: He is alert and oriented to person, place, and time. He displays abnormal reflex. No cranial nerve deficit. Coordination normal.  the left sided decreased coordination-cannot transfer self independently and dropped his utensil, grip strength is still 5/5. Hx of right sided weakness with R hand contracture. Left facial weakness.    Skin: Skin is warm and dry. No rash noted. He is not diaphoretic. No erythema. No pallor.  he right cholecystostomy surgical scar. The medial left lower leg surgical scar from previous melanoma removal.    Psychiatric: Thought content normal. His mood appears anxious. His affect is not angry and not inappropriate. His speech is not rapid and/or pressured, not delayed and not slurred. He is agitated. He is not aggressive, not hyperactive, not slowed and not withdrawn. Cognition and memory are impaired. He expresses impulsivity and inappropriate judgment. He does not exhibit a depressed mood. He exhibits abnormal recent memory. He exhibits normal remote memory.   (type .physexam) Filed Vitals:   01/27/15 1418  BP: 150/80  Pulse: 56  Temp: 97.6 F (36.4 C)  TempSrc: Tympanic  Resp: 16      Labs reviewed: Basic Metabolic Panel:  Recent Labs  02/02/14 1910 02/04/14 0534 04/12/14  NA 137 142 138  K 4.3 4.2 5.1  CL 100 107  --   CO2 22 25  --   GLUCOSE 135* 90  --   BUN 29* 32* 32*  CREATININE 1.22 1.31 1.1  CALCIUM 8.4 8.1*  --    Liver Function Tests:  Recent Labs  02/01/14 02/04/14 0534 04/12/14  AST 11* 143* 12*  ALT 8*  194* 8*  ALKPHOS 96 234* 105  BILITOT  --  1.5*  --   PROT  --  5.7*  --   ALBUMIN  --  2.5*  --  No results for input(s): LIPASE, AMYLASE in the last 8760 hours. CBC:  Recent Labs  02/02/14 1910 02/04/14 0534 04/12/14  WBC 11.7* 12.6* 7.8  NEUTROABS 11.3*  --   --   HGB 11.1* 10.1* 12.0*  HCT 34.4* 32.0* 36*  MCV 90.5 92.0  --   PLT 226 194 268    Past Procedures:  02/01/14 CXR mild cardiomegaly without pulmonary vascular congestion or pleural effusion, old granulomatous disease, no inflammatory consolidate or suspicious nodule, small patchy ares of atelectasis or non-consolidated pneumonia are seen at the lung bases.   2D Echocardiogram  - Left ventricle: Wall thickness was increased in a pattern of mild LVH. Systolic function was normal. The estimated ejection fraction was in the range of 55% to 60%. - Aortic valve: There was mild stenosis.  - Left atrium: The atrium was moderately dilated. - Atrial septum: No defect or patent foramen ovale was identified.   MRI did in fact show Small acute left lateral medullary and left anterior frontal lobe (pre motor/Broca's area) infarcts. No associated mass effect. Petechial hemorrhage versus chronic micro hemorrhage in the left frontal lobe lesion, but no malignant hemorrhagic transformation. 2. Decreased flow or occlusion of the distal left vertebral artery corresponding to the lateral medullary infarct. Left MCA M1 and branch irregularity with no focal stenosis or major branch occlusion.   Assessment/Plan CVA (cerebral vascular accident) Right sided weakness with right wrist/hand contracture. Grip strength is still 5/5. Ambulates with walker and w/c to go further. New onset of decreased the left sided coordination 01/2014. C/o worsened in his right sided weakness and stiffness in his left side.     Atrial fibrillation Atrial fibrillation remained rate controlled. Patient was placed on Eliquis per neurology's recommendations. event.  EKG normal sinus rhythm    Essential hypertension Controlled, takes Metoprolol 25mg  bid.     Long term current use of anticoagulant Eliquis since the CVA 01/2014    GERD Stable. off Omeprazole 20mg -refusal.        Family/ Staff Communication: observe the patient.   Goals of Care: SNF  Labs/tests ordered: none

## 2015-01-27 NOTE — Assessment & Plan Note (Signed)
Controlled, takes Metoprolol 25mg  bid.

## 2015-01-27 NOTE — Assessment & Plan Note (Signed)
Right sided weakness with right wrist/hand contracture. Grip strength is still 5/5. Ambulates with walker and w/c to go further. New onset of decreased the left sided coordination 01/2014. C/o worsened in his right sided weakness and stiffness in his left side.

## 2015-01-27 NOTE — Assessment & Plan Note (Signed)
Atrial fibrillation remained rate controlled. Patient was placed on Eliquis per neurology's recommendations. event. EKG normal sinus rhythm

## 2015-01-27 NOTE — Assessment & Plan Note (Signed)
Stable. off Omeprazole 20mg-refusal. 

## 2015-02-26 ENCOUNTER — Encounter: Payer: Self-pay | Admitting: Nurse Practitioner

## 2015-02-26 ENCOUNTER — Non-Acute Institutional Stay (SKILLED_NURSING_FACILITY): Payer: Medicare Other | Admitting: Nurse Practitioner

## 2015-02-26 DIAGNOSIS — I482 Chronic atrial fibrillation, unspecified: Secondary | ICD-10-CM

## 2015-02-26 DIAGNOSIS — M19042 Primary osteoarthritis, left hand: Secondary | ICD-10-CM

## 2015-02-26 DIAGNOSIS — L989 Disorder of the skin and subcutaneous tissue, unspecified: Secondary | ICD-10-CM | POA: Diagnosis not present

## 2015-02-26 DIAGNOSIS — Z7901 Long term (current) use of anticoagulants: Secondary | ICD-10-CM

## 2015-02-26 DIAGNOSIS — I639 Cerebral infarction, unspecified: Secondary | ICD-10-CM | POA: Diagnosis not present

## 2015-02-26 DIAGNOSIS — I10 Essential (primary) hypertension: Secondary | ICD-10-CM | POA: Diagnosis not present

## 2015-02-26 DIAGNOSIS — K219 Gastro-esophageal reflux disease without esophagitis: Secondary | ICD-10-CM | POA: Diagnosis not present

## 2015-02-26 NOTE — Assessment & Plan Note (Signed)
08/26/14 changed Tylenol 650mg  bid to q4h prn.  02/17/15 dc Vicodin prn-not used.

## 2015-02-26 NOTE — Assessment & Plan Note (Signed)
Atrial fibrillation remained rate controlled. Patient was placed on Eliquis per neurology's recommendations. event. EKG normal sinus rhythm 08/22/14 MMSE 28/30

## 2015-02-26 NOTE — Progress Notes (Signed)
Patient ID: [name], male   DOB: [birth year], 81 y.o.   MRN: [id number]   Code Status: DNR  Allergies  Allergen Reactions  . Sulfonamide Derivatives Rash    REACTION: rash    Chief Complaint  Patient presents with  . Medical Management of Chronic Issues    HPI: Patient is a 81 y.o. male seen in the SNF at [facility] today for evaluation of chronic medical conditions.  Problem List Items Addressed This Visit    Essential hypertension - Primary    Controlled, takes Metoprolol 25mg  bid.         GERD    Stable. off Omeprazole 20mg -refusal.         CVA (cerebral vascular accident)    Right sided weakness with right wrist/hand contracture. Grip strength is still 5/5. Ambulates with walker and w/c to go further. New onset of decreased the left sided coordination 01/2014. C/o worsened in his right sided weakness and stiffness in his left side.  02/1615 MMSE 24/30        Long term current use of anticoagulant    Eliquis since the CVA 01/2014       Atrial fibrillation    Atrial fibrillation remained rate controlled. Patient was placed on Eliquis per neurology's recommendations. event. EKG normal sinus rhythm 08/22/14 MMSE 28/30       Osteoarthritis of hand, left    08/26/14 changed Tylenol 650mg  bid to q4h prn.  02/17/15 dc Vicodin prn-not used.        Skin lesion of scalp      Review of Systems:  Review of Systems  Constitutional: Negative for fever, chills and diaphoresis.  HENT: Positive for hearing loss. Negative for congestion, ear discharge, ear pain, nosebleeds, sore throat and tinnitus.   Eyes: Negative for photophobia, pain, discharge and redness.  Respiratory: Negative for cough, shortness of breath, wheezing and stridor.   Cardiovascular: Positive for leg swelling. Negative for chest pain and palpitations.       Trace  Gastrointestinal: Negative for nausea, vomiting, abdominal pain, diarrhea, constipation and blood in stool.  Endocrine:  Negative for polydipsia.  Genitourinary: Positive for frequency. Negative for dysuria, urgency, hematuria and flank pain.  Musculoskeletal: Negative for myalgias, back pain and neck pain.  Skin: Negative for rash.       Dry scaly skin. Top of left head skin lesion 2x2x1cm rough surface, inflamed base.   Allergic/Immunologic: Negative for environmental allergies.  Neurological: Negative for dizziness, tremors, seizures, weakness and headaches.  Hematological: Bruises/bleeds easily.  Psychiatric/Behavioral: Negative for suicidal ideas and hallucinations. The patient is nervous/anxious.      Past Medical History  Diagnosis Date  . GERD (gastroesophageal reflux disease)   . Hypertension   . Hearing loss     wears hearing aid - right side  . Leg swelling   . Dysphagia   . Bruises easily     due to coumadin  . Weakness     difficulty walking  . Contact lens/glasses fitting   . Stroke 1999, 02/02/14  . Cancer   . 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 Gait disturbance   . COPD (chronic obstructive pulmonary disease)   . Peripheral vascular disease   . History of melanoma   . Diverticulosis   . Abnormality of gait 05/16/2013  . Choledocholithiasis with obstruction 06/28/13  . Embolic stroke 6/64/40  . PAF (paroxysmal atrial fibrillation)   . Hyperlipidemia   . Bradycardia   . Protein-calorie malnutrition, severe   .  Ventricular tachycardia (paroxysmal)   . Obstructive sleep apnea of adult     uses cpap, pt does not know setting  . Personal history of fall 05/29/2013  . Fracture of femoral neck, right, closed 3114  . Long term (current) use of anticoagulants     PAF and embolic CVA  . Anemia, unspecified 07/05/2013  . Xerophthalmia 07/05/2013  . 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 Actinic keratosis   . Seborrheic keratosis   . Aortic valve stenosis   . Cerebrovascular disease   . Elevated liver enzymes   . Diverticulosis   . Herpes zoster 02/08/14    Left facial nerve distribution  . Right  hemiparesis   . Melanoma 2009  . Hyperglycemia   . Xerophthalmia    Past Surgical History  Procedure Laterality Date  . Excision of melanoma  2009    left leg  . Melanoma excision      many melanoma removed in past  . Melanoma excision  10/11/2011    Procedure: MELANOMA EXCISION;  Surgeon: Pedro Earls, MD;  Location: [facility];  Service: General;  Laterality: Left;  EXCISION melanoma left leg with full thickness skin grafting from left lower abdomen.  . Joint replacement  2012    r fem head fx  . Lung benign area removed   1970's  . Hernia repair  1983  . Melanoma excision  05/16/2012    Procedure: MELANOMA EXCISION;  Surgeon: Pedro Earls, MD;  Location: [facility];  Service: General;  Laterality: Left;  Nodule Removal of Melanoma on Left Shin  . Ercp N/A 07/04/2013    Procedure: ENDOSCOPIC RETROGRADE CHOLANGIOPANCREATOGRAPHY (ERCP);  Surgeon: Irene Shipper, MD;  Location: [facility];  Service: Endoscopy;  Laterality: N/A;   Social History:   reports that he quit smoking about 50 years ago. His smoking use included Pipe. He has never used smokeless tobacco. He reports that he does not drink alcohol or use illicit drugs.  Family History  Problem Relation Age of Onset  . Heart disease Father 43  . Cancer Son     prostate  . Dementia Sister     One sister has dementia    Medications: Patient's Medications  New Prescriptions   No medications on file  Previous Medications   ACETAMINOPHEN (TYLENOL) 325 MG TABLET    Take 650 mg by mouth every 6 (six) hours as needed for pain.    ALUM & MAG HYDROXIDE-SIMETH (MINTOX) 462-703-50 MG/5ML SUSPENSION    Take 30 mLs by mouth every 6 (six) hours as needed for indigestion or heartburn.   APIXABAN (ELIQUIS) 5 MG TABS TABLET    Take 1 tablet (5 mg total) by mouth 2 (two) times daily.   BISACODYL (DULCOLAX) 10 MG SUPPOSITORY    Place 10 mg rectally every 12 (twelve) hours as needed for moderate constipation.   DOXYCYCLINE (VIBRAMYCIN) 100 MG  CAPSULE       FUROSEMIDE (LASIX) 20 MG TABLET       HYDROXYPROPYL METHYLCELLULOSE (ISOPTO TEARS) 2.5 % OPHTHALMIC SOLUTION    Place 1 drop into both eyes 4 (four) times daily as needed (eye irritation).    METOPROLOL TARTRATE (LOPRESSOR) 25 MG TABLET    Take 25 mg by mouth 2 (two) times daily.   OMEPRAZOLE (PRILOSEC) 20 MG CAPSULE    Take 20 mg by mouth every morning.    POLYETHYLENE GLYCOL (MIRALAX / GLYCOLAX) PACKET    Take 17 g by mouth every other day.  WARFARIN (COUMADIN) 4 MG TABLET       WHITE PETROLATUM-MINERAL OIL (SYSTANE NIGHTTIME) OINT    Place 1 application into both eyes at bedtime.  Modified Medications   No medications on file  Discontinued Medications   No medications on file     Physical Exam: Physical Exam  Constitutional: He is oriented to person, place, and time. He appears well-developed and well-nourished. No distress.  HENT:  Head: Normocephalic and atraumatic.  Right Ear: External ear normal.  Left Ear: External ear normal.  Nose: Nose normal.  Mouth/Throat: Oropharynx is clear and moist. No oropharyngeal exudate.  Eyes: Conjunctivae are normal. Pupils are equal, round, and reactive to light. Right eye exhibits no discharge. No scleral icterus.  Left eye blindness. The right lower eyelid ectropion.   Neck: Normal range of motion. Neck supple. No JVD present. No tracheal deviation present. No thyromegaly present.  Cardiovascular: Normal rate and normal heart sounds.  An irregular rhythm present.  Pulmonary/Chest: Effort normal and breath sounds normal. No stridor. No respiratory distress. He has no wheezes. He has no rales. He exhibits no tenderness.  Abdominal: He exhibits no distension. There is no tenderness. There is no rebound and no guarding.  Musculoskeletal: Normal range of motion. He exhibits edema. He exhibits no tenderness.  The right sided weakness since previous CVA-muscle strength is still 5/5. The right wrist and hand contractures-able to make  fist and cannot extend fully. No longer ambulates with walker and w/c for mobility Trace pedal edema R>L Left sided weakness from the recent CVA-mild-grip strength 5/5  Lymphadenopathy:    He has no cervical adenopathy.  Neurological: He is alert and oriented to person, place, and time. He displays abnormal reflex. No cranial nerve deficit. Coordination normal.  the left sided decreased coordination-cannot transfer self independently and dropped his utensil, grip strength is still 5/5. Hx of right sided weakness with R hand contracture. Left facial weakness.    Skin: Skin is warm and dry. No rash noted. He is not diaphoretic. No erythema. No pallor.  he right cholecystostomy surgical scar. The medial left lower leg surgical scar from previous melanoma removal. Top of left head skin lesion 2x2x1cm rough surface, inflamed base.      Psychiatric: Thought content normal. His mood appears anxious. His affect is not angry and not inappropriate. His speech is not rapid and/or pressured, not delayed and not slurred. He is agitated. He is not aggressive, not hyperactive, not slowed and not withdrawn. Cognition and memory are impaired. He expresses impulsivity and inappropriate judgment. He does not exhibit a depressed mood. He exhibits abnormal recent memory. He exhibits normal remote memory.    Filed Vitals:   02/26/15 1151  BP: 122/72  Pulse: 54  Temp: 97.8 F (36.6 C)  TempSrc: Tympanic  Resp: 16      Labs reviewed: Basic Metabolic Panel:  Recent Labs  04/12/14  NA 138  K 5.1  BUN 32*  CREATININE 1.1   Liver Function Tests:  Recent Labs  04/12/14  AST 12*  ALT 8*  ALKPHOS 105   No results for input(s): LIPASE, AMYLASE in the last 8760 hours. No results for input(s): AMMONIA in the last 8760 hours. CBC:  Recent Labs  04/12/14  WBC 7.8  HGB 12.0*  HCT 36*  PLT 268   Lipid Panel: No results for input(s): CHOL, HDL, LDLCALC, TRIG, CHOLHDL, LDLDIRECT in the last 8760  hours.  Past Procedures:  02/01/14 CXR mild cardiomegaly without pulmonary vascular congestion  or pleural effusion, old granulomatous disease, no inflammatory consolidate or suspicious nodule, small patchy ares of atelectasis or non-consolidated pneumonia are seen at the lung bases.   2D Echocardiogram  - Left ventricle: Wall thickness was increased in a pattern of mild LVH. Systolic function was normal. The estimated ejection fraction was in the range of 55% to 60%. - Aortic valve: There was mild stenosis.  - Left atrium: The atrium was moderately dilated. - Atrial septum: No defect or patent foramen ovale was identified.   MRI did in fact show Small acute left lateral medullary and left anterior frontal lobe (pre motor/Broca's area) infarcts. No associated mass effect. Petechial hemorrhage versus chronic micro hemorrhage in the left frontal lobe lesion, but no malignant hemorrhagic transformation. 2. Decreased flow or occlusion of the distal left vertebral artery corresponding to the lateral medullary infarct. Left MCA M1 and branch irregularity with no focal stenosis or major branch occlusion.     Assessment/Plan Essential hypertension Controlled, takes Metoprolol 25mg  bid.      GERD Stable. off Omeprazole 20mg -refusal.      CVA (cerebral vascular accident) Right sided weakness with right wrist/hand contracture. Grip strength is still 5/5. Ambulates with walker and w/c to go further. New onset of decreased the left sided coordination 01/2014. C/o worsened in his right sided weakness and stiffness in his left side.  02/1615 MMSE 24/30     Long term current use of anticoagulant Eliquis since the CVA 01/2014    Atrial fibrillation Atrial fibrillation remained rate controlled. Patient was placed on Eliquis per neurology's recommendations. event. EKG normal sinus rhythm 08/22/14 MMSE 28/30    Osteoarthritis of hand, left 08/26/14 changed Tylenol 650mg  bid to q4h prn.    02/17/15 dc Vicodin prn-not used.       Family/ Staff Communication: observe the patient  Goals of Care: SNF  Labs/tests ordered: none

## 2015-02-26 NOTE — Assessment & Plan Note (Signed)
Controlled, takes Metoprolol 25mg  bid.

## 2015-02-26 NOTE — Assessment & Plan Note (Signed)
Right sided weakness with right wrist/hand contracture. Grip strength is still 5/5. Ambulates with walker and w/c to go further. New onset of decreased the left sided coordination 01/2014. C/o worsened in his right sided weakness and stiffness in his left side.  02/1615 MMSE 24/30

## 2015-02-26 NOTE — Assessment & Plan Note (Signed)
Stable. off Omeprazole 20mg-refusal. 

## 2015-02-26 NOTE — Assessment & Plan Note (Signed)
Eliquis since the CVA 01/2014

## 2015-03-31 ENCOUNTER — Non-Acute Institutional Stay (SKILLED_NURSING_FACILITY): Payer: Medicare Other | Admitting: Nurse Practitioner

## 2015-03-31 ENCOUNTER — Encounter: Payer: Self-pay | Admitting: Nurse Practitioner

## 2015-03-31 DIAGNOSIS — I639 Cerebral infarction, unspecified: Secondary | ICD-10-CM

## 2015-03-31 DIAGNOSIS — L989 Disorder of the skin and subcutaneous tissue, unspecified: Secondary | ICD-10-CM | POA: Diagnosis not present

## 2015-03-31 DIAGNOSIS — K219 Gastro-esophageal reflux disease without esophagitis: Secondary | ICD-10-CM

## 2015-03-31 DIAGNOSIS — I10 Essential (primary) hypertension: Secondary | ICD-10-CM

## 2015-03-31 DIAGNOSIS — Z7901 Long term (current) use of anticoagulants: Secondary | ICD-10-CM

## 2015-03-31 DIAGNOSIS — I480 Paroxysmal atrial fibrillation: Secondary | ICD-10-CM | POA: Diagnosis not present

## 2015-03-31 NOTE — Assessment & Plan Note (Signed)
Atrial fibrillation remained rate controlled. Patient was placed on Eliquis per neurology's recommendations. event. EKG normal sinus rhythm 08/22/14 MMSE 28/30

## 2015-03-31 NOTE — Assessment & Plan Note (Signed)
Stable. off Omeprazole 20mg-refusal. 

## 2015-03-31 NOTE — Assessment & Plan Note (Addendum)
Right sided weakness with right wrist/hand contracture. Grip strength is still 5/5. Ambulates with walker and w/c to go further. New onset of decreased the left sided coordination 01/2014. C/o worsened in his right sided weakness and stiffness in his left side.  02/1615 MMSE 24/30 04/02/15 desires more therapy for his paralysis

## 2015-03-31 NOTE — Progress Notes (Signed)
Patient ID: [name], male   DOB: Sep 12, 1919, 81 y.o.   MRN: [id number]   Code Status: DNR  Allergies  Allergen Reactions  . Sulfonamide Derivatives Rash    REACTION: rash    Chief Complaint  Patient presents with  . Medical Management of Chronic Issues    HPI: Patient is a 81 y.o. male seen in the SNF at [facility] today for evaluation of chronic medical conditions.  Problem List Items Addressed This Visit    Essential hypertension - Primary    Controlled, takes Metoprolol 25mg  bid.        GERD    Stable. off Omeprazole 20mg -refusal.         CVA (cerebral vascular accident)    Right sided weakness with right wrist/hand contracture. Grip strength is still 5/5. Ambulates with walker and w/c to go further. New onset of decreased the left sided coordination 01/2014. C/o worsened in his right sided weakness and stiffness in his left side.  02/1615 MMSE 24/30 04/02/15 desires more therapy for his paralysis        Long term current use of anticoagulant    Eliquis since the CVA 01/2014        Atrial fibrillation    Atrial fibrillation remained rate controlled. Patient was placed on Eliquis per neurology's recommendations. event. EKG normal sinus rhythm 08/22/14 MMSE 28/30       Skin lesion of scalp      Review of Systems:  Review of Systems  Constitutional: Negative for fever, chills and diaphoresis.  HENT: Positive for hearing loss. Negative for congestion, ear discharge, ear pain, nosebleeds, sore throat and tinnitus.   Eyes: Negative for photophobia, pain, discharge and redness.  Respiratory: Negative for cough, shortness of breath, wheezing and stridor.   Cardiovascular: Positive for leg swelling. Negative for chest pain and palpitations.       Trace  Gastrointestinal: Negative for nausea, vomiting, abdominal pain, diarrhea, constipation and blood in stool.  Endocrine: Negative for polydipsia.  Genitourinary: Positive for frequency. Negative for  dysuria, urgency, hematuria and flank pain.  Musculoskeletal: Negative for myalgias, back pain and neck pain.  Skin: Negative for rash.       Dry scaly skin. Top of left head skin lesion 2x2x1cm rough surface, inflamed base.   Allergic/Immunologic: Negative for environmental allergies.  Neurological: Negative for dizziness, tremors, seizures, weakness and headaches.  Hematological: Bruises/bleeds easily.  Psychiatric/Behavioral: Negative for suicidal ideas and hallucinations. The patient is nervous/anxious.      Past Medical History  Diagnosis Date  . GERD (gastroesophageal reflux disease)   . Hypertension   . Hearing loss     wears hearing aid - right side  . Leg swelling   . Dysphagia   . Bruises easily     due to coumadin  . Weakness     difficulty walking  . Contact lens/glasses fitting   . Stroke 1999, 02/02/14  . Cancer   . 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 Gait disturbance   . COPD (chronic obstructive pulmonary disease)   . Peripheral vascular disease   . History of melanoma   . Diverticulosis   . Abnormality of gait 05/16/2013  . Choledocholithiasis with obstruction 06/28/13  . Embolic stroke 1/60/73  . PAF (paroxysmal atrial fibrillation)   . Hyperlipidemia   . Bradycardia   . Protein-calorie malnutrition, severe   . Ventricular tachycardia (paroxysmal)   . Obstructive sleep apnea of adult     uses cpap, pt does  not know setting  . Personal history of fall 05/29/2013  . Fracture of femoral neck, right, closed 3114  . Long term (current) use of anticoagulants     PAF and embolic CVA  . Anemia, unspecified 07/05/2013  . Xerophthalmia 07/05/2013  . 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 Actinic keratosis   . Seborrheic keratosis   . Aortic valve stenosis   . Cerebrovascular disease   . Elevated liver enzymes   . Diverticulosis   . Herpes zoster 02/08/14    Left facial nerve distribution  . Right hemiparesis   . Melanoma 2009  . Hyperglycemia   . Xerophthalmia    Past  Surgical History  Procedure Laterality Date  . Excision of melanoma  2009    left leg  . Melanoma excision      many melanoma removed in past  . Melanoma excision  10/11/2011    Procedure: MELANOMA EXCISION;  Surgeon: Pedro Earls, MD;  Location: [place];  Service: General;  Laterality: Left;  EXCISION melanoma left leg with full thickness skin grafting from left lower abdomen.  . Joint replacement  2012    r fem head fx  . Lung benign area removed   1970's  . Hernia repair  1983  . Melanoma excision  05/16/2012    Procedure: MELANOMA EXCISION;  Surgeon: Pedro Earls, MD;  Location: [facility];  Service: General;  Laterality: Left;  Nodule Removal of Melanoma on Left Shin  . Ercp N/A 07/04/2013    Procedure: ENDOSCOPIC RETROGRADE CHOLANGIOPANCREATOGRAPHY (ERCP);  Surgeon: Irene Shipper, MD;  Location: [facility];  Service: Endoscopy;  Laterality: N/A;   Social History:   reports that he quit smoking about 50 years ago. His smoking use included Pipe. He has never used smokeless tobacco. He reports that he does not drink alcohol or use illicit drugs.  Family History  Problem Relation Age of Onset  . Heart disease Father 30  . Cancer Son     prostate  . Dementia Sister     One sister has dementia    Medications: Patient's Medications  New Prescriptions   No medications on file  Previous Medications   ACETAMINOPHEN (TYLENOL) 325 MG TABLET    Take 650 mg by mouth every 6 (six) hours as needed for pain.    ALUM & MAG HYDROXIDE-SIMETH (MINTOX) 528-413-24 MG/5ML SUSPENSION    Take 30 mLs by mouth every 6 (six) hours as needed for indigestion or heartburn.   APIXABAN (ELIQUIS) 5 MG TABS TABLET    Take 1 tablet (5 mg total) by mouth 2 (two) times daily.   BISACODYL (DULCOLAX) 10 MG SUPPOSITORY    Place 10 mg rectally every 12 (twelve) hours as needed for moderate constipation.   DOXYCYCLINE (VIBRAMYCIN) 100 MG CAPSULE       FUROSEMIDE (LASIX) 20 MG TABLET       HYDROXYPROPYL  METHYLCELLULOSE (ISOPTO TEARS) 2.5 % OPHTHALMIC SOLUTION    Place 1 drop into both eyes 4 (four) times daily as needed (eye irritation).    METOPROLOL TARTRATE (LOPRESSOR) 25 MG TABLET    Take 25 mg by mouth 2 (two) times daily.   OMEPRAZOLE (PRILOSEC) 20 MG CAPSULE    Take 20 mg by mouth every morning.    POLYETHYLENE GLYCOL (MIRALAX / GLYCOLAX) PACKET    Take 17 g by mouth every other day.   WARFARIN (COUMADIN) 4 MG TABLET       WHITE PETROLATUM-MINERAL OIL (SYSTANE NIGHTTIME) OINT  Place 1 application into both eyes at bedtime.  Modified Medications   No medications on file  Discontinued Medications   No medications on file     Physical Exam: Physical Exam  Constitutional: He is oriented to person, place, and time. He appears well-developed and well-nourished. No distress.  HENT:  Head: Normocephalic and atraumatic.  Right Ear: External ear normal.  Left Ear: External ear normal.  Nose: Nose normal.  Mouth/Throat: Oropharynx is clear and moist. No oropharyngeal exudate.  Eyes: Conjunctivae are normal. Pupils are equal, round, and reactive to light. Right eye exhibits no discharge. No scleral icterus.  Left eye blindness. The right lower eyelid ectropion.   Neck: Normal range of motion. Neck supple. No JVD present. No tracheal deviation present. No thyromegaly present.  Cardiovascular: Normal rate and normal heart sounds.  An irregular rhythm present.  Pulmonary/Chest: Effort normal and breath sounds normal. No stridor. No respiratory distress. He has no wheezes. He has no rales. He exhibits no tenderness.  Abdominal: He exhibits no distension. There is no tenderness. There is no rebound and no guarding.  Musculoskeletal: Normal range of motion. He exhibits edema. He exhibits no tenderness.  The right sided weakness since previous CVA-muscle strength is still 5/5. The right wrist and hand contractures-able to make fist and cannot extend fully. No longer ambulates with walker and w/c  for mobility Trace pedal edema R>L Left sided weakness from the recent CVA-mild-grip strength 5/5  Lymphadenopathy:    He has no cervical adenopathy.  Neurological: He is alert and oriented to person, place, and time. He displays abnormal reflex. No cranial nerve deficit. Coordination normal.  the left sided decreased coordination-cannot transfer self independently and dropped his utensil, grip strength is still 5/5. Hx of right sided weakness with R hand contracture. Left facial weakness.    Skin: Skin is warm and dry. No rash noted. He is not diaphoretic. No erythema. No pallor.  he right cholecystostomy surgical scar. The medial left lower leg surgical scar from previous melanoma removal. Top of left head skin lesion 2x2x1cm rough surface, inflamed base.      Psychiatric: Thought content normal. His mood appears anxious. His affect is not angry and not inappropriate. His speech is not rapid and/or pressured, not delayed and not slurred. He is agitated. He is not aggressive, not hyperactive, not slowed and not withdrawn. Cognition and memory are impaired. He expresses impulsivity and inappropriate judgment. He does not exhibit a depressed mood. He exhibits abnormal recent memory. He exhibits normal remote memory.    There were no vitals filed for this visit.    Labs reviewed: Basic Metabolic Panel:  Recent Labs  04/12/14  NA 138  K 5.1  BUN 32*  CREATININE 1.1   Liver Function Tests:  Recent Labs  04/12/14  AST 12*  ALT 8*  ALKPHOS 105   No results for input(s): LIPASE, AMYLASE in the last 8760 hours. No results for input(s): AMMONIA in the last 8760 hours. CBC:  Recent Labs  04/12/14  WBC 7.8  HGB 12.0*  HCT 36*  PLT 268   Lipid Panel: No results for input(s): CHOL, HDL, LDLCALC, TRIG, CHOLHDL, LDLDIRECT in the last 8760 hours.  Past Procedures:  02/01/14 CXR mild cardiomegaly without pulmonary vascular congestion or pleural effusion, old granulomatous disease, no  inflammatory consolidate or suspicious nodule, small patchy ares of atelectasis or non-consolidated pneumonia are seen at the lung bases.   2D Echocardiogram  - Left ventricle: Wall thickness was increased  in a pattern of mild LVH. Systolic function was normal. The estimated ejection fraction was in the range of 55% to 60%. - Aortic valve: There was mild stenosis.  - Left atrium: The atrium was moderately dilated. - Atrial septum: No defect or patent foramen ovale was identified.   MRI did in fact show Small acute left lateral medullary and left anterior frontal lobe (pre motor/Broca's area) infarcts. No associated mass effect. Petechial hemorrhage versus chronic micro hemorrhage in the left frontal lobe lesion, but no malignant hemorrhagic transformation. 2. Decreased flow or occlusion of the distal left vertebral artery corresponding to the lateral medullary infarct. Left MCA M1 and branch irregularity with no focal stenosis or major branch occlusion.     Assessment/Plan Essential hypertension Controlled, takes Metoprolol 25mg  bid.     GERD Stable. off Omeprazole 20mg -refusal.      CVA (cerebral vascular accident) Right sided weakness with right wrist/hand contracture. Grip strength is still 5/5. Ambulates with walker and w/c to go further. New onset of decreased the left sided coordination 01/2014. C/o worsened in his right sided weakness and stiffness in his left side.  02/1615 MMSE 24/30 04/02/15 desires more therapy for his paralysis     Long term current use of anticoagulant Eliquis since the CVA 01/2014     Atrial fibrillation Atrial fibrillation remained rate controlled. Patient was placed on Eliquis per neurology's recommendations. event. EKG normal sinus rhythm 08/22/14 MMSE 28/30      Family/ Staff Communication: observe the patient  Goals of Care: SNF  Labs/tests ordered: none

## 2015-03-31 NOTE — Assessment & Plan Note (Signed)
Controlled, takes Metoprolol 25mg  bid.

## 2015-03-31 NOTE — Assessment & Plan Note (Signed)
Eliquis since the CVA 01/2014

## 2015-04-09 ENCOUNTER — Non-Acute Institutional Stay (SKILLED_NURSING_FACILITY): Payer: Medicare Other | Admitting: Nurse Practitioner

## 2015-04-09 ENCOUNTER — Encounter: Payer: Self-pay | Admitting: Nurse Practitioner

## 2015-04-09 DIAGNOSIS — I482 Chronic atrial fibrillation, unspecified: Secondary | ICD-10-CM

## 2015-04-09 DIAGNOSIS — Z7901 Long term (current) use of anticoagulants: Secondary | ICD-10-CM

## 2015-04-09 DIAGNOSIS — L03116 Cellulitis of left lower limb: Secondary | ICD-10-CM | POA: Diagnosis not present

## 2015-04-09 DIAGNOSIS — I639 Cerebral infarction, unspecified: Secondary | ICD-10-CM | POA: Diagnosis not present

## 2015-04-09 DIAGNOSIS — I10 Essential (primary) hypertension: Secondary | ICD-10-CM | POA: Diagnosis not present

## 2015-04-09 NOTE — Assessment & Plan Note (Signed)
Eliquis since the CVA 01/2014

## 2015-04-09 NOTE — Assessment & Plan Note (Signed)
Left lower leg from knee down-more redness, heat, swelling from his chronic venous insufficiency. Will treat with Doxy 100mg  bid x 10 day and topical Triamcinolone daily until healed.

## 2015-04-09 NOTE — Assessment & Plan Note (Signed)
Right sided weakness with right wrist/hand contracture. Grip strength is still 5/5. Ambulates with walker and w/c to go further. New onset of decreased the left sided coordination 01/2014. C/o worsened in his right sided weakness and stiffness in his left side.  02/1615 MMSE 24/30 04/02/15 desires more therapy for his paralysis

## 2015-04-09 NOTE — Assessment & Plan Note (Signed)
Atrial fibrillation remained rate controlled. Patient was placed on Eliquis per neurology's recommendations. event. EKG normal sinus rhythm 08/22/14 MMSE 28/30

## 2015-04-09 NOTE — Progress Notes (Signed)
Patient ID: [name], male   DOB: 08/11/1919, 81 y.o.   MRN: [id number]   Code Status: DNR  Allergies  Allergen Reactions  . Sulfonamide Derivatives Rash    REACTION: rash    Chief Complaint  Patient presents with  . Medical Management of Chronic Issues  . Acute Visit    LLE swelling, redness, and fever.     HPI: Patient is a 81 y.o. male seen in the SNF at [facility] today for evaluation of redness/swelling/heat LLE from knee down and chronic medical conditions.  Problem List Items Addressed This Visit    Essential hypertension    Controlled, takes Metoprolol 25mg  bid.         CVA (cerebral vascular accident)    Right sided weakness with right wrist/hand contracture. Grip strength is still 5/5. Ambulates with walker and w/c to go further. New onset of decreased the left sided coordination 01/2014. C/o worsened in his right sided weakness and stiffness in his left side.  02/1615 MMSE 24/30 04/02/15 desires more therapy for his paralysis       Long term current use of anticoagulant    Eliquis since the CVA 01/2014        Atrial fibrillation    Atrial fibrillation remained rate controlled. Patient was placed on Eliquis per neurology's recommendations. event. EKG normal sinus rhythm 08/22/14 MMSE 28/30        Cellulitis of leg, left - Primary    Left lower leg from knee down-more redness, heat, swelling from his chronic venous insufficiency. Will treat with Doxy 100mg  bid x 10 day and topical Triamcinolone daily until healed.          Review of Systems:  Review of Systems  Constitutional: Negative for fever, chills and diaphoresis.  HENT: Positive for hearing loss. Negative for congestion, ear discharge, ear pain, nosebleeds, sore throat and tinnitus.   Eyes: Negative for photophobia, pain, discharge and redness.  Respiratory: Negative for cough, shortness of breath, wheezing and stridor.   Cardiovascular: Positive for leg swelling. Negative for chest  pain and palpitations.       Trace  Gastrointestinal: Negative for nausea, vomiting, abdominal pain, diarrhea, constipation and blood in stool.  Endocrine: Negative for polydipsia.  Genitourinary: Positive for frequency. Negative for dysuria, urgency, hematuria and flank pain.  Musculoskeletal: Negative for myalgias, back pain and neck pain.  Skin: Negative for rash.       Dry scaly skin. Top of left head skin lesion 2x2x1cm rough surface, inflamed base.  Redness, heat, swelling LLE from knee down-flare up from his baseline.   Allergic/Immunologic: Negative for environmental allergies.  Neurological: Negative for dizziness, tremors, seizures, weakness and headaches.  Hematological: Bruises/bleeds easily.  Psychiatric/Behavioral: Negative for suicidal ideas and hallucinations. The patient is nervous/anxious.      Past Medical History  Diagnosis Date  . GERD (gastroesophageal reflux disease)   . Hypertension   . Hearing loss     wears hearing aid - right side  . Leg swelling   . Dysphagia   . Bruises easily     due to coumadin  . Weakness     difficulty walking  . Contact lens/glasses fitting   . Stroke 1999, 02/02/14  . Cancer   . 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 Gait disturbance   . COPD (chronic obstructive pulmonary disease)   . Peripheral vascular disease   . History of melanoma   . Diverticulosis   . Abnormality of gait 05/16/2013  .  Choledocholithiasis with obstruction 06/28/13  . Embolic stroke 0/17/51  . PAF (paroxysmal atrial fibrillation)   . Hyperlipidemia   . Bradycardia   . Protein-calorie malnutrition, severe   . Ventricular tachycardia (paroxysmal)   . Obstructive sleep apnea of adult     uses cpap, pt does not know setting  . Personal history of fall 05/29/2013  . Fracture of femoral neck, right, closed 3114  . Long term (current) use of anticoagulants     PAF and embolic CVA  . Anemia, unspecified 07/05/2013  . Xerophthalmia 07/05/2013  . Deafness in right  ear   . Actinic keratosis   . Seborrheic keratosis   . Aortic valve stenosis   . Cerebrovascular disease   . Elevated liver enzymes   . Diverticulosis   . Herpes zoster 02/08/14    Left facial nerve distribution  . Right hemiparesis   . Melanoma 2009  . Hyperglycemia   . Xerophthalmia    Past Surgical History  Procedure Laterality Date  . Excision of melanoma  2009    left leg  . Melanoma excision      many melanoma removed in past  . Melanoma excision  10/11/2011    Procedure: MELANOMA EXCISION;  Surgeon: Pedro Earls, MD;  Location: [facility];  Service: General;  Laterality: Left;  EXCISION melanoma left leg with full thickness skin grafting from left lower abdomen.  . Joint replacement  2012    r fem head fx  . Lung benign area removed   1970's  . Hernia repair  1983  . Melanoma excision  05/16/2012    Procedure: MELANOMA EXCISION;  Surgeon: Pedro Earls, MD;  Location: [facility];  Service: General;  Laterality: Left;  Nodule Removal of Melanoma on Left Shin  . Ercp N/A 07/04/2013    Procedure: ENDOSCOPIC RETROGRADE CHOLANGIOPANCREATOGRAPHY (ERCP);  Surgeon: Irene Shipper, MD;  Location: [facility];  Service: Endoscopy;  Laterality: N/A;   Social History:   reports that he quit smoking about 50 years ago. His smoking use included Pipe. He has never used smokeless tobacco. He reports that he does not drink alcohol or use illicit drugs.  Family History  Problem Relation Age of Onset  . Heart disease Father 44  . Cancer Son     prostate  . Dementia Sister     One sister has dementia    Medications: Patient's Medications  New Prescriptions   No medications on file  Previous Medications   ACETAMINOPHEN (TYLENOL) 325 MG TABLET    Take 650 mg by mouth every 6 (six) hours as needed for pain.    ALUM & MAG HYDROXIDE-SIMETH (MINTOX) 025-852-77 MG/5ML SUSPENSION    Take 30 mLs by mouth every 6 (six) hours as needed for indigestion or heartburn.   APIXABAN (ELIQUIS) 5 MG TABS  TABLET    Take 1 tablet (5 mg total) by mouth 2 (two) times daily.   BISACODYL (DULCOLAX) 10 MG SUPPOSITORY    Place 10 mg rectally every 12 (twelve) hours as needed for moderate constipation.   DOXYCYCLINE (VIBRAMYCIN) 100 MG CAPSULE       FUROSEMIDE (LASIX) 20 MG TABLET       HYDROXYPROPYL METHYLCELLULOSE (ISOPTO TEARS) 2.5 % OPHTHALMIC SOLUTION    Place 1 drop into both eyes 4 (four) times daily as needed (eye irritation).    METOPROLOL TARTRATE (LOPRESSOR) 25 MG TABLET    Take 25 mg by mouth 2 (two) times daily.   OMEPRAZOLE (PRILOSEC) 20 MG  CAPSULE    Take 20 mg by mouth every morning.    POLYETHYLENE GLYCOL (MIRALAX / GLYCOLAX) PACKET    Take 17 g by mouth every other day.   WARFARIN (COUMADIN) 4 MG TABLET       WHITE PETROLATUM-MINERAL OIL (SYSTANE NIGHTTIME) OINT    Place 1 application into both eyes at bedtime.  Modified Medications   No medications on file  Discontinued Medications   No medications on file     Physical Exam: Physical Exam  Constitutional: He is oriented to person, place, and time. He appears well-developed and well-nourished. No distress.  HENT:  Head: Normocephalic and atraumatic.  Right Ear: External ear normal.  Left Ear: External ear normal.  Nose: Nose normal.  Mouth/Throat: Oropharynx is clear and moist. No oropharyngeal exudate.  Eyes: Conjunctivae are normal. Pupils are equal, round, and reactive to light. Right eye exhibits no discharge. No scleral icterus.  Left eye blindness. The right lower eyelid ectropion.   Neck: Normal range of motion. Neck supple. No JVD present. No tracheal deviation present. No thyromegaly present.  Cardiovascular: Normal rate and normal heart sounds.  An irregular rhythm present.  Pulmonary/Chest: Effort normal and breath sounds normal. No stridor. No respiratory distress. He has no wheezes. He has no rales. He exhibits no tenderness.  Abdominal: He exhibits no distension. There is no tenderness. There is no rebound and no  guarding.  Musculoskeletal: Normal range of motion. He exhibits edema. He exhibits no tenderness.  The right sided weakness since previous CVA-muscle strength is still 5/5. The right wrist and hand contractures-able to make fist and cannot extend fully. No longer ambulates with walker and w/c for mobility Trace pedal edema R>L Left sided weakness from the recent CVA-mild-grip strength 5/5  Lymphadenopathy:    He has no cervical adenopathy.  Neurological: He is alert and oriented to person, place, and time. He displays abnormal reflex. No cranial nerve deficit. Coordination normal.  the left sided decreased coordination-cannot transfer self independently and dropped his utensil, grip strength is still 5/5. Hx of right sided weakness with R hand contracture. Left facial weakness.    Skin: Skin is warm and dry. No rash noted. He is not diaphoretic. No erythema. No pallor.  he right cholecystostomy surgical scar. The medial left lower leg surgical scar from previous melanoma removal. Top of left head skin lesion 2x2x1cm rough surface, inflamed base. Redness, heat, swelling LLE from knee down-flare up from his baseline.        Psychiatric: Thought content normal. His mood appears anxious. His affect is not angry and not inappropriate. His speech is not rapid and/or pressured, not delayed and not slurred. He is agitated. He is not aggressive, not hyperactive, not slowed and not withdrawn. Cognition and memory are impaired. He expresses impulsivity and inappropriate judgment. He does not exhibit a depressed mood. He exhibits abnormal recent memory. He exhibits normal remote memory.    Filed Vitals:   04/09/15 1847  BP: 140/70  Pulse: 66  Temp: 97.7 F (36.5 C)  TempSrc: Tympanic  Resp: 20      Labs reviewed: Basic Metabolic Panel:  Recent Labs  04/12/14  NA 138  K 5.1  BUN 32*  CREATININE 1.1   Liver Function Tests:  Recent Labs  04/12/14  AST 12*  ALT 8*  ALKPHOS 105   No  results for input(s): LIPASE, AMYLASE in the last 8760 hours. No results for input(s): AMMONIA in the last 8760 hours. CBC:  Recent  Labs  04/12/14  WBC 7.8  HGB 12.0*  HCT 36*  PLT 268   Lipid Panel: No results for input(s): CHOL, HDL, LDLCALC, TRIG, CHOLHDL, LDLDIRECT in the last 8760 hours.  Past Procedures:  02/01/14 CXR mild cardiomegaly without pulmonary vascular congestion or pleural effusion, old granulomatous disease, no inflammatory consolidate or suspicious nodule, small patchy ares of atelectasis or non-consolidated pneumonia are seen at the lung bases.   2D Echocardiogram  - Left ventricle: Wall thickness was increased in a pattern of mild LVH. Systolic function was normal. The estimated ejection fraction was in the range of 55% to 60%. - Aortic valve: There was mild stenosis.  - Left atrium: The atrium was moderately dilated. - Atrial septum: No defect or patent foramen ovale was identified.   MRI did in fact show Small acute left lateral medullary and left anterior frontal lobe (pre motor/Broca's area) infarcts. No associated mass effect. Petechial hemorrhage versus chronic micro hemorrhage in the left frontal lobe lesion, but no malignant hemorrhagic transformation. 2. Decreased flow or occlusion of the distal left vertebral artery corresponding to the lateral medullary infarct. Left MCA M1 and branch irregularity with no focal stenosis or major branch occlusion.     Assessment/Plan Cellulitis of leg, left Left lower leg from knee down-more redness, heat, swelling from his chronic venous insufficiency. Will treat with Doxy 100mg  bid x 10 day and topical Triamcinolone daily until healed.    Essential hypertension Controlled, takes Metoprolol 25mg  bid.      Long term current use of anticoagulant Eliquis since the CVA 01/2014     Atrial fibrillation Atrial fibrillation remained rate controlled. Patient was placed on Eliquis per neurology's recommendations.  event. EKG normal sinus rhythm 08/22/14 MMSE 28/30     CVA (cerebral vascular accident) Right sided weakness with right wrist/hand contracture. Grip strength is still 5/5. Ambulates with walker and w/c to go further. New onset of decreased the left sided coordination 01/2014. C/o worsened in his right sided weakness and stiffness in his left side.  02/1615 MMSE 24/30 04/02/15 desires more therapy for his paralysis      Family/ Staff Communication: observe the patient  Goals of Care: SNF  Labs/tests ordered: none

## 2015-04-09 NOTE — Assessment & Plan Note (Signed)
Controlled, takes Metoprolol 25mg  bid.

## 2015-05-21 ENCOUNTER — Non-Acute Institutional Stay (SKILLED_NURSING_FACILITY): Payer: Medicare Other | Admitting: Nurse Practitioner

## 2015-05-21 ENCOUNTER — Encounter: Payer: Self-pay | Admitting: Nurse Practitioner

## 2015-05-21 DIAGNOSIS — Z7901 Long term (current) use of anticoagulants: Secondary | ICD-10-CM

## 2015-05-21 DIAGNOSIS — M19042 Primary osteoarthritis, left hand: Secondary | ICD-10-CM | POA: Diagnosis not present

## 2015-05-21 DIAGNOSIS — I10 Essential (primary) hypertension: Secondary | ICD-10-CM

## 2015-05-21 DIAGNOSIS — I639 Cerebral infarction, unspecified: Secondary | ICD-10-CM

## 2015-05-21 DIAGNOSIS — I480 Paroxysmal atrial fibrillation: Secondary | ICD-10-CM | POA: Diagnosis not present

## 2015-05-21 NOTE — Assessment & Plan Note (Signed)
Eliquis since the past CVA 01/2014

## 2015-05-21 NOTE — Assessment & Plan Note (Signed)
08/26/14 changed Tylenol 650mg  bid to q4h prn.  02/17/15 dc Vicodin prn-not used.

## 2015-05-21 NOTE — Assessment & Plan Note (Signed)
Right sided weakness with right wrist/hand contracture. Grip strength is still 5/5. Ambulates with walker and w/c to go further. New onset of decreased the left sided coordination 01/2014. C/o worsened in his right sided weakness and stiffness in his left side.  02/1615 MMSE 24/30 04/02/15 desires more therapy for his paralysis

## 2015-05-21 NOTE — Assessment & Plan Note (Signed)
Controlled, takes Metoprolol 25mg  bid.

## 2015-05-21 NOTE — Assessment & Plan Note (Signed)
Atrial fibrillation remained rate controlled. Patient was placed on Eliquis per neurology's recommendations. event. EKG normal sinus rhythm 08/22/14 MMSE 28/30

## 2015-05-21 NOTE — Progress Notes (Signed)
Patient ID: EIN RIJO, male   DOB: Oct 23, 1919, 81 y.o.   MRN: [id number]   Code Status: DNR  Allergies  Allergen Reactions  . Sulfonamide Derivatives Rash    REACTION: rash    Chief Complaint  Patient presents with  . Medical Management of Chronic Issues    HPI: Patient is a 81 y.o. male seen in the SNF at [facility] today for evaluation of chronic medical conditions.  Problem List Items Addressed This Visit    Essential hypertension - Primary    Controlled, takes Metoprolol 25mg  bid.          CVA (cerebral vascular accident)    Right sided weakness with right wrist/hand contracture. Grip strength is still 5/5. Ambulates with walker and w/c to go further. New onset of decreased the left sided coordination 01/2014. C/o worsened in his right sided weakness and stiffness in his left side.  02/1615 MMSE 24/30 04/02/15 desires more therapy for his paralysis        Long term current use of anticoagulant therapy    Eliquis since the past CVA 01/2014       Atrial fibrillation    Atrial fibrillation remained rate controlled. Patient was placed on Eliquis per neurology's recommendations. event. EKG normal sinus rhythm 08/22/14 MMSE 28/30      Osteoarthritis of hand, left    08/26/14 changed Tylenol 650mg  bid to q4h prn.  02/17/15 dc Vicodin prn-not used.            Review of Systems:  Review of Systems  Constitutional: Negative for fever, chills and diaphoresis.  HENT: Positive for hearing loss. Negative for congestion, ear discharge, ear pain, nosebleeds, sore throat and tinnitus.   Eyes: Negative for photophobia, pain, discharge and redness.  Respiratory: Negative for cough, shortness of breath, wheezing and stridor.   Cardiovascular: Positive for leg swelling. Negative for chest pain and palpitations.       Trace  Gastrointestinal: Negative for nausea, vomiting, abdominal pain, diarrhea, constipation and blood in stool.  Endocrine: Negative for  polydipsia.  Genitourinary: Positive for frequency. Negative for dysuria, urgency, hematuria and flank pain.  Musculoskeletal: Negative for myalgias, back pain and neck pain.  Skin: Negative for rash.       Dry scaly skin. Top of left head skin lesion 2x2x1cm rough surface, inflamed base.  Redness, heat, swelling LLE from knee down-flare up from his baseline.   Allergic/Immunologic: Negative for environmental allergies.  Neurological: Negative for dizziness, tremors, seizures, weakness and headaches.  Hematological: Bruises/bleeds easily.  Psychiatric/Behavioral: Negative for suicidal ideas and hallucinations. The patient is nervous/anxious.      Past Medical History  Diagnosis Date  . GERD (gastroesophageal reflux disease)   . Hypertension   . Hearing loss     wears hearing aid - right side  . Leg swelling   . Dysphagia   . Bruises easily     due to coumadin  . Weakness     difficulty walking  . Contact lens/glasses fitting   . Stroke 1999, 02/02/14  . Cancer   . 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 Gait disturbance   . COPD (chronic obstructive pulmonary disease)   . Peripheral vascular disease   . History of melanoma   . Diverticulosis   . Abnormality of gait 05/16/2013  . Choledocholithiasis with obstruction 06/28/13  . Embolic stroke 1/91/47  . PAF (paroxysmal atrial fibrillation)   . Hyperlipidemia   . Bradycardia   . Protein-calorie malnutrition, severe   .  Ventricular tachycardia (paroxysmal)   . Obstructive sleep apnea of adult     uses cpap, pt does not know setting  . Personal history of fall 05/29/2013  . Fracture of femoral neck, right, closed 3114  . Long term (current) use of anticoagulants     PAF and embolic CVA  . Anemia, unspecified 07/05/2013  . Xerophthalmia 07/05/2013  . 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 Actinic keratosis   . Seborrheic keratosis   . Aortic valve stenosis   . Cerebrovascular disease   . Elevated liver enzymes   . Diverticulosis   . 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 Right hemiparesis   . Melanoma 2009  . Hyperglycemia   . Xerophthalmia    Past Surgical History  Procedure Laterality Date  . Excision of melanoma  2009    left leg  . Melanoma excision      many melanoma removed in past  . Melanoma excision  10/11/2011    Procedure: MELANOMA EXCISION;  Surgeon: Pedro Earls, MD;  Location: [facility];  Service: General;  Laterality: Left;  EXCISION melanoma left leg with full thickness skin grafting from left lower abdomen.  . Joint replacement  2012    r fem head fx  . Lung benign area removed   1970's  . Hernia repair  1983  . Melanoma excision  05/16/2012    Procedure: MELANOMA EXCISION;  Surgeon: Pedro Earls, MD;  Location: [facility];  Service: General;  Laterality: Left;  Nodule Removal of Melanoma on Left Shin  . Ercp N/A 07/04/2013    Procedure: ENDOSCOPIC RETROGRADE CHOLANGIOPANCREATOGRAPHY (ERCP);  Surgeon: Irene Shipper, MD;  Location: [facility];  Service: Endoscopy;  Laterality: N/A;   Social History:   reports that he quit smoking about 50 years ago. His smoking use included Pipe. He has never used smokeless tobacco. He reports that he does not drink alcohol or use illicit drugs.  Family History  Problem Relation Age of Onset  . Heart disease Father 86  . Cancer Son     prostate  . Dementia Sister     One sister has dementia    Medications: Patient's Medications  New Prescriptions   No medications on file  Previous Medications   ACETAMINOPHEN (TYLENOL) 325 MG TABLET    Take 650 mg by mouth every 6 (six) hours as needed for pain.    ALUM & MAG HYDROXIDE-SIMETH (MINTOX) 938-101-75 MG/5ML SUSPENSION    Take 30 mLs by mouth every 6 (six) hours as needed for indigestion or heartburn.   APIXABAN (ELIQUIS) 5 MG TABS TABLET    Take 1 tablet (5 mg total) by mouth 2 (two) times daily.   BISACODYL (DULCOLAX) 10 MG SUPPOSITORY    Place 10 mg rectally every 12 (twelve) hours as needed for  moderate constipation.   DOXYCYCLINE (VIBRAMYCIN) 100 MG CAPSULE       FUROSEMIDE (LASIX) 20 MG TABLET       HYDROXYPROPYL METHYLCELLULOSE (ISOPTO TEARS) 2.5 % OPHTHALMIC SOLUTION    Place 1 drop into both eyes 4 (four) times daily as needed (eye irritation).    METOPROLOL TARTRATE (LOPRESSOR) 25 MG TABLET    Take 25 mg by mouth 2 (two) times daily.   OMEPRAZOLE (PRILOSEC) 20 MG CAPSULE    Take 20 mg by mouth every morning.    POLYETHYLENE GLYCOL (MIRALAX / GLYCOLAX) PACKET    Take 17 g by mouth every other day.  WARFARIN (COUMADIN) 4 MG TABLET       WHITE PETROLATUM-MINERAL OIL (SYSTANE NIGHTTIME) OINT    Place 1 application into both eyes at bedtime.  Modified Medications   No medications on file  Discontinued Medications   No medications on file     Physical Exam: Physical Exam  Constitutional: He is oriented to person, place, and time. He appears well-developed and well-nourished. No distress.  HENT:  Head: Normocephalic and atraumatic.  Right Ear: External ear normal.  Left Ear: External ear normal.  Nose: Nose normal.  Mouth/Throat: Oropharynx is clear and moist. No oropharyngeal exudate.  Eyes: Conjunctivae are normal. Pupils are equal, round, and reactive to light. Right eye exhibits no discharge. No scleral icterus.  Left eye blindness. The right lower eyelid ectropion.   Neck: Normal range of motion. Neck supple. No JVD present. No tracheal deviation present. No thyromegaly present.  Cardiovascular: Normal rate and normal heart sounds.  An irregular rhythm present.  Pulmonary/Chest: Effort normal and breath sounds normal. No stridor. No respiratory distress. He has no wheezes. He has no rales. He exhibits no tenderness.  Abdominal: He exhibits no distension. There is no tenderness. There is no rebound and no guarding.  Musculoskeletal: Normal range of motion. He exhibits edema. He exhibits no tenderness.  The right sided weakness since previous CVA-muscle strength is still  5/5. The right wrist and hand contractures-able to make fist and cannot extend fully. No longer ambulates with walker and w/c for mobility Trace pedal edema R>L Left sided weakness from the recent CVA-mild-grip strength 5/5  Lymphadenopathy:    He has no cervical adenopathy.  Neurological: He is alert and oriented to person, place, and time. He displays abnormal reflex. No cranial nerve deficit. Coordination normal.  the left sided decreased coordination-cannot transfer self independently and dropped his utensil, grip strength is still 5/5. Hx of right sided weakness with R hand contracture. Left facial weakness.    Skin: Skin is warm and dry. No rash noted. He is not diaphoretic. No erythema. No pallor.  he right cholecystostomy surgical scar. The medial left lower leg surgical scar from previous melanoma removal. Top of left head skin lesion 2x2x1cm rough surface, inflamed base. Redness, heat, swelling LLE from knee down-flare up from his baseline.        Psychiatric: Thought content normal. His mood appears anxious. His affect is not angry and not inappropriate. His speech is not rapid and/or pressured, not delayed and not slurred. He is agitated. He is not aggressive, not hyperactive, not slowed and not withdrawn. Cognition and memory are impaired. He expresses impulsivity and inappropriate judgment. He does not exhibit a depressed mood. He exhibits abnormal recent memory. He exhibits normal remote memory.    Filed Vitals:   05/21/15 1110  BP: 120/60  Pulse: 70  Temp: 98.2 F (36.8 C)  TempSrc: Tympanic  Resp: 16      Labs reviewed: Basic Metabolic Panel: No results for input(s): NA, K, CL, CO2, GLUCOSE, BUN, CREATININE, CALCIUM, MG, PHOS, TSH in the last 8760 hours. Liver Function Tests: No results for input(s): AST, ALT, ALKPHOS, BILITOT, PROT, ALBUMIN in the last 8760 hours. No results for input(s): LIPASE, AMYLASE in the last 8760 hours. No results for input(s): AMMONIA in  the last 8760 hours. CBC: No results for input(s): WBC, NEUTROABS, HGB, HCT, MCV, PLT in the last 8760 hours. Lipid Panel: No results for input(s): CHOL, HDL, LDLCALC, TRIG, CHOLHDL, LDLDIRECT in the last 8760 hours.  Past Procedures:  02/01/14 CXR mild cardiomegaly without pulmonary vascular congestion or pleural effusion, old granulomatous disease, no inflammatory consolidate or suspicious nodule, small patchy ares of atelectasis or non-consolidated pneumonia are seen at the lung bases.   2D Echocardiogram  - Left ventricle: Wall thickness was increased in a pattern of mild LVH. Systolic function was normal. The estimated ejection fraction was in the range of 55% to 60%. - Aortic valve: There was mild stenosis.  - Left atrium: The atrium was moderately dilated. - Atrial septum: No defect or patent foramen ovale was identified.   MRI did in fact show Small acute left lateral medullary and left anterior frontal lobe (pre motor/Broca's area) infarcts. No associated mass effect. Petechial hemorrhage versus chronic micro hemorrhage in the left frontal lobe lesion, but no malignant hemorrhagic transformation. 2. Decreased flow or occlusion of the distal left vertebral artery corresponding to the lateral medullary infarct. Left MCA M1 and branch irregularity with no focal stenosis or major branch occlusion.     Assessment/Plan Essential hypertension Controlled, takes Metoprolol 25mg  bid.      CVA (cerebral vascular accident) Right sided weakness with right wrist/hand contracture. Grip strength is still 5/5. Ambulates with walker and w/c to go further. New onset of decreased the left sided coordination 01/2014. C/o worsened in his right sided weakness and stiffness in his left side.  02/1615 MMSE 24/30 04/02/15 desires more therapy for his paralysis    Long term current use of anticoagulant therapy Eliquis since the past CVA 01/2014   Atrial fibrillation Atrial fibrillation remained  rate controlled. Patient was placed on Eliquis per neurology's recommendations. event. EKG normal sinus rhythm 08/22/14 MMSE 28/30  Osteoarthritis of hand, left 08/26/14 changed Tylenol 650mg  bid to q4h prn.  02/17/15 dc Vicodin prn-not used.       Family/ Staff Communication: observe the patient  Goals of Care: SNF  Labs/tests ordered: none

## 2015-08-25 ENCOUNTER — Non-Acute Institutional Stay (SKILLED_NURSING_FACILITY): Payer: Medicare Other | Admitting: Nurse Practitioner

## 2015-08-25 ENCOUNTER — Encounter: Payer: Self-pay | Admitting: Nurse Practitioner

## 2015-08-25 DIAGNOSIS — M19042 Primary osteoarthritis, left hand: Secondary | ICD-10-CM

## 2015-08-25 DIAGNOSIS — I480 Paroxysmal atrial fibrillation: Secondary | ICD-10-CM | POA: Diagnosis not present

## 2015-08-25 DIAGNOSIS — I10 Essential (primary) hypertension: Secondary | ICD-10-CM | POA: Diagnosis not present

## 2015-08-25 NOTE — Progress Notes (Signed)
Patient ID: [name], male   DOB: [birth year], 81 y.o.   MRN: [id number]  Location:  SNF [facility] Provider:  Marlana Latus NP  Code Status:  DNR Goals of care: Advanced Directive information    Chief Complaint  Patient presents with  . Medical Management of Chronic Issues     HPI: Patient is a 81 y.o. male seen in the SNF at [facility] today for evaluation of chronic medical conditions. Hx of CVS, mainly left sided paralysis, no longer ambulating, motorized w/c fo mobility. Blood pressure is controlled on Metoprolol 25mg  bid, heart rate is controlled, taking Eliquis for PE/DVT risk reduction, multiple sites osteoarthritis, chronic aches and pain are well controlled with Tylenol. He is functioning adequately in SNF.   Review of Systems:  Review of Systems  Constitutional: Negative for fever and chills.  HENT: Positive for hearing loss. Negative for congestion, ear discharge, ear pain and nosebleeds.   Eyes: Negative for pain, discharge and redness.  Respiratory: Negative for cough, shortness of breath and wheezing.   Cardiovascular: Positive for leg swelling. Negative for chest pain and palpitations.       Trace  Gastrointestinal: Negative for nausea, vomiting, abdominal pain and constipation.  Genitourinary: Positive for frequency. Negative for dysuria and urgency.  Musculoskeletal: Negative for myalgias, back pain and neck pain.  Skin: Negative for rash.       Dry scaly skin. Top of left head skin lesion 2x2x1cm rough surface, inflamed base.    Neurological: Negative for dizziness, tremors, seizures, weakness and headaches.  Endo/Heme/Allergies: Negative for environmental allergies. Bruises/bleeds easily.  Psychiatric/Behavioral: Positive for memory loss. Negative for suicidal ideas and hallucinations. The patient is nervous/anxious.     Past Medical History  Diagnosis Date  . GERD (gastroesophageal reflux disease)   . Hypertension   . Hearing loss     wears hearing  aid - right side  . Leg swelling   . Dysphagia   . Bruises easily     due to coumadin  . Weakness     difficulty walking  . Contact lens/glasses fitting   . Stroke 1999, 02/02/14  . Cancer   . 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 Gait disturbance   . COPD (chronic obstructive pulmonary disease)   . Peripheral vascular disease   . History of melanoma   . Diverticulosis   . Abnormality of gait 05/16/2013  . Choledocholithiasis with obstruction 06/28/13  . Embolic stroke 2/95/18  . PAF (paroxysmal atrial fibrillation)   . Hyperlipidemia   . Bradycardia   . Protein-calorie malnutrition, severe   . Ventricular tachycardia (paroxysmal)   . Obstructive sleep apnea of adult     uses cpap, pt does not know setting  . Personal history of fall 05/29/2013  . Fracture of femoral neck, right, closed 3114  . Long term (current) use of anticoagulants     PAF and embolic CVA  . Anemia, unspecified 07/05/2013  . Xerophthalmia 07/05/2013  . 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 Actinic keratosis   . Seborrheic keratosis   . Aortic valve stenosis   . Cerebrovascular disease   . Elevated liver enzymes   . Diverticulosis   . Herpes zoster 02/08/14    Left facial nerve distribution  . Right hemiparesis   . Melanoma 2009  . Hyperglycemia   . Xerophthalmia     Patient Active Problem List   Diagnosis Date Noted  . Cellulitis of leg, left 04/09/2015  . Skin lesion of scalp  02/26/2015  . Rash and nonspecific skin eruption 11/18/2014  . Cellulitis of leg, right 10/28/2014  . Traumatic hematoma of right lower leg 10/21/2014  . Dry skin dermatitis 08/21/2014  . Cellulitis of left upper arm 07/22/2014  . Ectropion of left lower eyelid 07/03/2014  . Osteoarthritis of hand, left 07/03/2014  . Dysphagia, unspecified(787.20) 02/18/2014  . Hyperglycemia   . Herpes zoster 02/08/2014  . Acute ischemic stroke 02/04/2014  . TIA (transient ischemic attack) 02/03/2014  . Hypoxia 02/02/2014  . Hypotension 02/02/2014  .  Left-sided weakness 02/02/2014  . Edema 12/20/2013  . Constipation 08/15/2013  . Acute respiratory failure with hypoxia 08/10/2013  . Anemia of chronic disease 08/10/2013  . Anemia, unspecified 07/05/2013  . Atrial fibrillation 06/30/2013  . Protein-calorie malnutrition, severe 06/29/2013  . CVA (cerebral vascular accident) 01/19/2011  . Long term current use of anticoagulant therapy 01/19/2011  . GERD 08/19/2008  . Essential hypertension 09/21/2007    Allergies  Allergen Reactions  . Sulfonamide Derivatives Rash    REACTION: rash    Medications: Patient's Medications  New Prescriptions   No medications on file  Previous Medications   ACETAMINOPHEN (TYLENOL) 325 MG TABLET    Take 650 mg by mouth every 6 (six) hours as needed for pain.    ALUM & MAG HYDROXIDE-SIMETH (MINTOX) 149-702-63 MG/5ML SUSPENSION    Take 30 mLs by mouth every 6 (six) hours as needed for indigestion or heartburn.   APIXABAN (ELIQUIS) 5 MG TABS TABLET    Take 1 tablet (5 mg total) by mouth 2 (two) times daily.   BISACODYL (DULCOLAX) 10 MG SUPPOSITORY    Place 10 mg rectally every 12 (twelve) hours as needed for moderate constipation.   DOXYCYCLINE (VIBRAMYCIN) 100 MG CAPSULE       FUROSEMIDE (LASIX) 20 MG TABLET       HYDROXYPROPYL METHYLCELLULOSE (ISOPTO TEARS) 2.5 % OPHTHALMIC SOLUTION    Place 1 drop into both eyes 4 (four) times daily as needed (eye irritation).    METOPROLOL TARTRATE (LOPRESSOR) 25 MG TABLET    Take 25 mg by mouth 2 (two) times daily.   OMEPRAZOLE (PRILOSEC) 20 MG CAPSULE    Take 20 mg by mouth every morning.    POLYETHYLENE GLYCOL (MIRALAX / GLYCOLAX) PACKET    Take 17 g by mouth every other day.   WARFARIN (COUMADIN) 4 MG TABLET       WHITE PETROLATUM-MINERAL OIL (SYSTANE NIGHTTIME) OINT    Place 1 application into both eyes at bedtime.  Modified Medications   No medications on file  Discontinued Medications   No medications on file    Physical Exam: Filed Vitals:   08/25/15  1549  BP: 140/70  Pulse: 60  Temp: 97.9 F (36.6 C)  TempSrc: Tympanic  Resp: 18   There is no weight on file to calculate BMI.  Physical Exam  Constitutional: He is oriented to person, place, and time. He appears well-developed and well-nourished. No distress.  HENT:  Head: Normocephalic and atraumatic.  Right Ear: External ear normal.  Left Ear: External ear normal.  Nose: Nose normal.  Mouth/Throat: No oropharyngeal exudate.  Eyes: Conjunctivae are normal. Pupils are equal, round, and reactive to light. Right eye exhibits no discharge. No scleral icterus.  Left eye blindness. The right lower eyelid ectropion.   Neck: Normal range of motion. Neck supple. No JVD present. No tracheal deviation present. No thyromegaly present.  Cardiovascular: Normal rate and normal heart sounds.  An irregular rhythm present.  Pulmonary/Chest: Effort normal and breath sounds normal. No respiratory distress. He has no wheezes. He has no rales. He exhibits no tenderness.  Abdominal: He exhibits no distension. There is no tenderness.  Musculoskeletal: Normal range of motion. He exhibits edema. He exhibits no tenderness.  The right sided weakness since previous CVA-muscle strength is still 5/5. The right wrist and hand contractures-able to make fist and cannot extend fully. No longer ambulates with walker and w/c for mobility Trace pedal edema R>L Left sided weakness from the recent CVA-mild-grip strength 5/5  Neurological: He is alert and oriented to person, place, and time. He displays abnormal reflex. No cranial nerve deficit. Coordination normal.  the left sided decreased coordination-cannot transfer self independently and dropped his utensil, grip strength is still 5/5. Hx of right sided weakness with R hand contracture. Left facial weakness.    Skin: Skin is warm and dry. No rash noted. He is not diaphoretic. No erythema. No pallor.  he right cholecystostomy surgical scar. The medial left lower leg  surgical scar from previous melanoma removal. Top of left head skin lesion 2x2x1cm rough surface, inflamed base.        Psychiatric: Thought content normal. His mood appears anxious. His affect is not angry and not inappropriate. His speech is not rapid and/or pressured, not delayed and not slurred. He is agitated. He is not aggressive, not hyperactive, not slowed and not withdrawn. Cognition and memory are impaired. He expresses impulsivity and inappropriate judgment. He does not exhibit a depressed mood. He exhibits abnormal recent memory. He exhibits normal remote memory.    Labs reviewed: Basic Metabolic Panel: No results for input(s): NA, K, CL, CO2, GLUCOSE, BUN, CREATININE, CALCIUM, MG, PHOS in the last 8760 hours.  Liver Function Tests: No results for input(s): AST, ALT, ALKPHOS, BILITOT, PROT, ALBUMIN in the last 8760 hours.  CBC: No results for input(s): WBC, NEUTROABS, HGB, HCT, MCV, PLT in the last 8760 hours.  Lab Results  Component Value Date   TSH 2.34 09/13/2013   Lab Results  Component Value Date   HGBA1C 5.6 02/03/2014   Lab Results  Component Value Date   CHOL 129 02/03/2014   HDL 45 02/03/2014   LDLCALC 64 02/03/2014   LDLDIRECT 139.4 04/26/2007   TRIG 99 02/03/2014   CHOLHDL 2.9 02/03/2014    Significant Diagnostic Results since last visit: none  Patient Care Team: Estill Dooms, MD as PCP - General (Internal Medicine) Kathrynn Ducking, MD as Consulting Physician (Neurology) Fanny Skates, MD as Consulting Physician (General Surgery) Irene Shipper, MD as Consulting Physician (Gastroenterology) Thayer Headings, MD as Consulting Physician (Cardiology) Tanda Rockers, MD as Consulting Physician (Pulmonary Disease)  Assessment/Plan Problem List Items Addressed This Visit    Essential hypertension - Primary    Controlled, takes Metoprolol 25mg  bid.         Atrial fibrillation    Atrial fibrillation remained rate controlled. Patient was placed on  Eliquis per neurology's recommendations      Osteoarthritis of hand, left    Multiple sites, tylenol is adequate in pain control.           Family/ staff Communication: continue SNF for care needs  Labs/tests ordered:  none  [facility] Mast NP Geriatric[facility] [address], [place] [address] On Call:  [phone] & follow prompts after 5pm & weekends Office Phone:  [phone] Office Fax:  [phone]

## 2015-08-25 NOTE — Assessment & Plan Note (Signed)
Controlled, takes Metoprolol 25mg bid.   

## 2015-08-25 NOTE — Assessment & Plan Note (Signed)
Multiple sites, tylenol is adequate in pain control.

## 2015-08-25 NOTE — Assessment & Plan Note (Signed)
Atrial fibrillation remained rate controlled. Patient was placed on Eliquis per neurology's recommendations

## 2015-09-05 ENCOUNTER — Non-Acute Institutional Stay (SKILLED_NURSING_FACILITY): Payer: Medicare Other | Admitting: Internal Medicine

## 2015-09-05 ENCOUNTER — Encounter: Payer: Self-pay | Admitting: Internal Medicine

## 2015-09-05 DIAGNOSIS — L723 Sebaceous cyst: Secondary | ICD-10-CM | POA: Diagnosis not present

## 2015-09-05 DIAGNOSIS — L089 Local infection of the skin and subcutaneous tissue, unspecified: Secondary | ICD-10-CM | POA: Insufficient documentation

## 2015-09-05 MED ORDER — SACCHAROMYCES BOULARDII 250 MG PO CAPS: ORAL_CAPSULE | ORAL | Status: DC

## 2015-09-05 MED ORDER — CEPHALEXIN 500 MG PO CAPS: ORAL_CAPSULE | ORAL | Status: DC

## 2015-09-05 NOTE — Progress Notes (Signed)
Patient ID: [name], male   DOB: [birth year], 81 y.o.   MRN: [id number]    Speed Room Number: 68  Place of Service: SNF (31)     Allergies  Allergen Reactions  . Sulfonamide Derivatives Rash    REACTION: rash    Chief Complaint  Patient presents with  . Acute Visit    Inflamed sebaceous cyst right scapular area    HPI:  Patient has had an enlarging mass of the right scapular area for the last couple days. Warm packs of the spine. It appears to be an inflamed and probably infected sebaceous cyst now measuring nearly 2 inches in diameter. There is erythema, pain with palpation, but no material was exuding from it.  Medications: Patient's Medications  New Prescriptions   No medications on file  Previous Medications   ACETAMINOPHEN (TYLENOL) 325 MG TABLET    Take 650 mg by mouth every 6 (six) hours as needed for pain.    ALUM & MAG HYDROXIDE-SIMETH (MINTOX) 891-694-50 MG/5ML SUSPENSION    Take 30 mLs by mouth every 6 (six) hours as needed for indigestion or heartburn.   APIXABAN (ELIQUIS) 5 MG TABS TABLET    Take 1 tablet (5 mg total) by mouth 2 (two) times daily.   BISACODYL (DULCOLAX) 10 MG SUPPOSITORY    Place 10 mg rectally every 12 (twelve) hours as needed for moderate constipation.   DOXYCYCLINE (VIBRAMYCIN) 100 MG CAPSULE       FUROSEMIDE (LASIX) 20 MG TABLET       HYDROXYPROPYL METHYLCELLULOSE (ISOPTO TEARS) 2.5 % OPHTHALMIC SOLUTION    Place 1 drop into both eyes 4 (four) times daily as needed (eye irritation).    METOPROLOL TARTRATE (LOPRESSOR) 25 MG TABLET    Take 25 mg by mouth 2 (two) times daily.   OMEPRAZOLE (PRILOSEC) 20 MG CAPSULE    Take 20 mg by mouth every morning.    POLYETHYLENE GLYCOL (MIRALAX / GLYCOLAX) PACKET    Take 17 g by mouth every other day.   WARFARIN (COUMADIN) 4 MG TABLET       WHITE PETROLATUM-MINERAL OIL (SYSTANE NIGHTTIME) OINT    Place 1 application into both eyes at bedtime.  Modified Medications     No medications on file  Discontinued Medications   No medications on file     Review of Systems  Constitutional: Negative for fever and chills.  HENT: Positive for hearing loss. Negative for congestion, ear discharge, ear pain and nosebleeds.   Eyes: Negative for pain, discharge and redness.  Respiratory: Negative for cough, shortness of breath and wheezing.   Cardiovascular: Positive for leg swelling. Negative for chest pain and palpitations.       Trace  Gastrointestinal: Negative for nausea, vomiting, abdominal pain and constipation.  Genitourinary: Positive for frequency. Negative for dysuria and urgency.  Musculoskeletal: Negative for myalgias, back pain and neck pain.  Skin: Negative for rash.       Dry scaly skin. Top of left head skin lesion 2x2x1cm rough surface, inflamed base.  2+ diameter inflamed right scapular area sebaceous cyst.  Allergic/Immunologic: Negative for environmental allergies.  Neurological: Negative for dizziness, tremors, seizures, weakness and headaches.  Hematological: Bruises/bleeds easily.  Psychiatric/Behavioral: Negative for suicidal ideas and hallucinations. The patient is nervous/anxious.     Filed Vitals:   09/05/15 1437  BP: 118/58  Pulse: 60  Temp: 97.9 F (36.6 C)  Resp: 20  Height: 5' 3" (1.6 m)  Weight: 163  lb 9.6 oz (74.208 kg)   Body mass index is 28.99 kg/(m^2).  Physical Exam  Constitutional: He is oriented to person, place, and time. He appears well-developed and well-nourished. No distress.  HENT:  Head: Normocephalic and atraumatic.  Right Ear: External ear normal.  Left Ear: External ear normal.  Nose: Nose normal.  Mouth/Throat: No oropharyngeal exudate.  Eyes: Conjunctivae are normal. Pupils are equal, round, and reactive to light. Right eye exhibits no discharge. No scleral icterus.  Left eye blindness. The right lower eyelid ectropion.   Neck: Normal range of motion. Neck supple. No JVD present. No tracheal  deviation present. No thyromegaly present.  Cardiovascular: Normal rate and normal heart sounds.  An irregular rhythm present.  Pulmonary/Chest: Effort normal and breath sounds normal. No respiratory distress. He has no wheezes. He has no rales. He exhibits no tenderness.  Abdominal: He exhibits no distension. There is no tenderness.  Musculoskeletal: Normal range of motion. He exhibits edema. He exhibits no tenderness.  The right sided weakness since previous CVA-muscle strength is still 5/5. The right wrist and hand contractures-able to make fist and cannot extend fully. No longer ambulates with walker and w/c for mobility Trace pedal edema R>L Left sided weakness from the recent CVA-mild-grip strength 5/5  Neurological: He is alert and oriented to person, place, and time. He displays abnormal reflex. No cranial nerve deficit. Coordination normal.  the left sided decreased coordination-cannot transfer self independently and dropped his utensil, grip strength is still 5/5. Hx of right sided weakness with R hand contracture. Left facial weakness.   Skin: Skin is warm and dry. No rash noted. He is not diaphoretic. No erythema. No pallor.  he right cholecystostomy surgical scar. The medial left lower leg surgical scar from previous melanoma removal. Top of left head skin lesion 2x2x1cm rough surface, inflamed base.  Right scapular area 2"  diameter inflamed sebaceous cyst that is tender to palpation. Erythema is noted.  Psychiatric: Thought content normal. His mood appears anxious. His affect is not angry and not inappropriate. His speech is not rapid and/or pressured, not delayed and not slurred. He is agitated. He is not aggressive, not hyperactive, not slowed and not withdrawn. Cognition and memory are impaired. He expresses impulsivity and inappropriate judgment. He does not exhibit a depressed mood. He exhibits abnormal recent memory. He exhibits normal remote memory.     Labs reviewed: Lab  Summary Latest Ref Rng 04/12/2014  Hemoglobin 13.5 - 17.5 g/dL 12.0(A)  Hematocrit 41 - 53 % 36(A)  White count - 7.8  Platelet count 150 - 399 K/L 268  Sodium 137 - 147 mmol/L 138  Potassium 3.4 - 5.3 mmol/L 5.1  Calcium - (None)  Phosphorus - (None)  Creatinine 0.6 - 1.3 mg/dL 1.1  AST 14 - 40 U/L 12(A)  Alk Phos 25 - 125 U/L 105  Bilirubin - (None)  Glucose - 84  Cholesterol - (None)  HDL cholesterol - (None)  Triglycerides - (None)  LDL Direct - (None)  LDL Calc - (None)  Total protein - (None)  Albumin - (None)   Lab Results  Component Value Date   TSH 2.34 09/13/2013   Lab Results  Component Value Date   BUN 32* 04/12/2014   Lab Results  Component Value Date   HGBA1C 5.6 02/03/2014    Assessment/Plan  1. Sebaceous cyst -Warm pack for 20 minutes daily to the lesion - cephALEXin (KEFLEX) 500 MG capsule; One 3 times daily for infection  Dispense: 21  capsule; Refill: 1 - saccharomyces boulardii (FLORASTOR) 250 MG capsule; One daily to help prevent Clostridium difficile infection  Dispense: 7 capsule; Refill: 0

## 2015-09-08 ENCOUNTER — Non-Acute Institutional Stay (SKILLED_NURSING_FACILITY): Payer: Medicare Other | Admitting: Nurse Practitioner

## 2015-09-08 ENCOUNTER — Encounter: Payer: Self-pay | Admitting: Nurse Practitioner

## 2015-09-08 DIAGNOSIS — L089 Local infection of the skin and subcutaneous tissue, unspecified: Secondary | ICD-10-CM

## 2015-09-08 DIAGNOSIS — K5900 Constipation, unspecified: Secondary | ICD-10-CM | POA: Diagnosis not present

## 2015-09-08 DIAGNOSIS — I482 Chronic atrial fibrillation, unspecified: Secondary | ICD-10-CM

## 2015-09-08 DIAGNOSIS — I6300 Cerebral infarction due to thrombosis of unspecified precerebral artery: Secondary | ICD-10-CM | POA: Diagnosis not present

## 2015-09-08 DIAGNOSIS — I10 Essential (primary) hypertension: Secondary | ICD-10-CM

## 2015-09-08 DIAGNOSIS — L723 Sebaceous cyst: Secondary | ICD-10-CM

## 2015-09-08 NOTE — Assessment & Plan Note (Signed)
Posterior lower right chest wall, I+D, adding Doxycycline 100mg  bid x 10 days, dry dressing daily.

## 2015-09-08 NOTE — Assessment & Plan Note (Signed)
Stable, continue Biscolax 10mg  suppository q12h prn.

## 2015-09-08 NOTE — Assessment & Plan Note (Signed)
Right sided weakness with right wrist/hand contracture. Grip strength is still 5/5. Ambulates with walker and w/c to go further. New onset of decreased the left sided coordination 01/2014. C/o worsened in his right sided weakness and stiffness in his left side.   

## 2015-09-08 NOTE — Assessment & Plan Note (Signed)
Controlled, takes Metoprolol 25mg bid.   

## 2015-09-08 NOTE — Progress Notes (Signed)
Patient ID: [name], male   DOB: Dec 31, 1918, 81 y.o.   MRN: [id number]  Location:  SNF [facility] Provider:  Marlana Latus NP  Code Status:  DNR Goals of care: Advanced Directive information    Chief Complaint  Patient presents with  . Medical Management of Chronic Issues  . Acute Visit    posterior left chest wall cellulitis     HPI: Patient is a 81 y.o. male seen in the SNF at [facility] today for evaluation of infected furuncle posterior right lower chest wall, treated with 7 day course of cephalexin 500mg  tid started 9/301/6, no improvement, abscess in the area noted,    Review of Systems:  Review of Systems  Constitutional: Negative for fever and chills.  HENT: Positive for hearing loss. Negative for congestion, ear discharge, ear pain and nosebleeds.   Eyes: Negative for pain, discharge and redness.  Respiratory: Negative for cough, shortness of breath and wheezing.   Cardiovascular: Positive for leg swelling. Negative for chest pain and palpitations.       Trace  Gastrointestinal: Negative for nausea, vomiting, abdominal pain and constipation.  Genitourinary: Positive for frequency. Negative for dysuria and urgency.  Musculoskeletal: Negative for myalgias, back pain and neck pain.  Skin: Negative for rash.       Dry scaly skin. Top of left head skin lesion 2x2x1cm rough surface, inflamed base.  3 inches diameter inflamed posterior right chest wall area sebaceous cyst.  Neurological: Negative for dizziness, tremors, seizures, weakness and headaches.  Endo/Heme/Allergies: Negative for environmental allergies. Bruises/bleeds easily.  Psychiatric/Behavioral: Positive for memory loss. Negative for suicidal ideas and hallucinations. The patient is nervous/anxious.     Past Medical History  Diagnosis Date  . GERD (gastroesophageal reflux disease)   . Hypertension   . Hearing loss     wears hearing aid - right side  . Leg swelling   . Dysphagia   . Bruises easily       due to coumadin  . Weakness     difficulty walking  . Contact lens/glasses fitting   . Stroke [facility]) 1999, 02/02/14  . Cancer ([place])   . 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 Gait disturbance   . COPD (chronic obstructive pulmonary disease) ([place])   . Peripheral vascular disease ([place])   . History of melanoma   . Diverticulosis   . Abnormality of gait 05/16/2013  . Choledocholithiasis with obstruction 06/28/13  . Embolic stroke ([place]) 02/23/21  . PAF (paroxysmal atrial fibrillation) ([place])   . Hyperlipidemia   . Bradycardia   . Protein-calorie malnutrition, severe ([place])   . Ventricular tachycardia (paroxysmal) ([place])   . Obstructive sleep apnea of adult     uses cpap, pt does not know setting  . Personal history of fall 05/29/2013  . Fracture of femoral neck, right, closed 3114  . Long term (current) use of anticoagulants     PAF and embolic CVA  . Anemia, unspecified 07/05/2013  . Xerophthalmia 07/05/2013  . 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 Actinic keratosis   . Seborrheic keratosis   . Aortic valve stenosis   . Cerebrovascular disease   . Elevated liver enzymes   . Diverticulosis   . Herpes zoster 02/08/14    Left facial nerve distribution  . Right hemiparesis ([place])   . Melanoma ([place]) 2009  . Hyperglycemia   . Xerophthalmia     Patient Active Problem List   Diagnosis Date Noted  . Infected sebaceous cyst of skin  09/05/2015  . Skin lesion of scalp 02/26/2015  . Rash and nonspecific skin eruption 11/18/2014  . Dry skin dermatitis 08/21/2014  . Ectropion of left lower eyelid 07/03/2014  . Osteoarthritis of hand, left 07/03/2014  . Dysphagia, unspecified(787.20) 02/18/2014  . Hyperglycemia   . Herpes zoster 02/08/2014  . Acute ischemic stroke ([place]) 02/04/2014  . TIA (transient ischemic attack) 02/03/2014  . Hypoxia 02/02/2014  . Hypotension 02/02/2014  . Left-sided weakness 02/02/2014  . Edema 12/20/2013  . Constipation 08/15/2013  . Anemia of chronic disease 08/10/2013  . Anemia,  unspecified 07/05/2013  . Atrial fibrillation ([place]) 06/30/2013  . Protein-calorie malnutrition, severe ([place]) 06/29/2013  . CVA (cerebral vascular accident) ([place]) 01/19/2011  . Long term current use of anticoagulant therapy 01/19/2011  . GERD 08/19/2008  . Essential hypertension 09/21/2007    Allergies  Allergen Reactions  . Sulfonamide Derivatives Rash    REACTION: rash    Medications: Patient's Medications  New Prescriptions   No medications on file  Previous Medications   ACETAMINOPHEN (TYLENOL) 325 MG TABLET    Take 650 mg by mouth every 6 (six) hours as needed for pain.    ALUM & MAG HYDROXIDE-SIMETH (MINTOX) 161-096-04 MG/5ML SUSPENSION    Take 30 mLs by mouth every 6 (six) hours as needed for indigestion or heartburn.   APIXABAN (ELIQUIS) 5 MG TABS TABLET    Take 1 tablet (5 mg total) by mouth 2 (two) times daily.   BISACODYL (DULCOLAX) 10 MG SUPPOSITORY    Place 10 mg rectally every 12 (twelve) hours as needed for moderate constipation.   CEPHALEXIN (KEFLEX) 500 MG CAPSULE    One 3 times daily for infection   DOXYCYCLINE (VIBRAMYCIN) 100 MG CAPSULE       FUROSEMIDE (LASIX) 20 MG TABLET       HYDROXYPROPYL METHYLCELLULOSE (ISOPTO TEARS) 2.5 % OPHTHALMIC SOLUTION    Place 1 drop into both eyes 4 (four) times daily as needed (eye irritation).    METOPROLOL TARTRATE (LOPRESSOR) 25 MG TABLET    Take 25 mg by mouth 2 (two) times daily.   OMEPRAZOLE (PRILOSEC) 20 MG CAPSULE    Take 20 mg by mouth every morning.    POLYETHYLENE GLYCOL (MIRALAX / GLYCOLAX) PACKET    Take 17 g by mouth every other day.   SACCHAROMYCES BOULARDII (FLORASTOR) 250 MG CAPSULE    One daily to help prevent Clostridium difficile infection   WARFARIN (COUMADIN) 4 MG TABLET       WHITE PETROLATUM-MINERAL OIL (SYSTANE NIGHTTIME) OINT    Place 1 application into both eyes at bedtime.  Modified Medications   No medications on file  Discontinued Medications   No medications on file    Physical Exam: Filed  Vitals:   09/08/15 1151  BP: 150/80  Pulse: 66  Temp: 97.8 F (36.6 C)  TempSrc: Tympanic  Resp: 20   There is no weight on file to calculate BMI.  Physical Exam  Constitutional: He is oriented to person, place, and time. He appears well-developed and well-nourished. No distress.  HENT:  Head: Normocephalic and atraumatic.  Right Ear: External ear normal.  Left Ear: External ear normal.  Nose: Nose normal.  Mouth/Throat: No oropharyngeal exudate.  Eyes: Conjunctivae are normal. Pupils are equal, round, and reactive to light. Right eye exhibits no discharge. No scleral icterus.  Left eye blindness. The right lower eyelid ectropion.   Neck: Normal range of motion. Neck supple. No JVD present. No tracheal deviation present. No thyromegaly present.  Cardiovascular: Normal rate and normal heart sounds.  An irregular rhythm present.  Pulmonary/Chest: Effort normal and breath sounds normal. No respiratory distress. He has no wheezes. He has no rales. He exhibits no tenderness.  Abdominal: He exhibits no distension. There is no tenderness.  Musculoskeletal: Normal range of motion. He exhibits edema. He exhibits no tenderness.  The right sided weakness since previous CVA-muscle strength is still 5/5. The right wrist and hand contractures-able to make fist and cannot extend fully. No longer ambulates with walker and w/c for mobility Trace pedal edema R>L Left sided weakness from the recent CVA-mild-grip strength 5/5  Neurological: He is alert and oriented to person, place, and time. He displays abnormal reflex. No cranial nerve deficit. Coordination normal.  the left sided decreased coordination-cannot transfer self independently and dropped his utensil, grip strength is still 5/5. Hx of right sided weakness with R hand contracture. Left facial weakness.   Skin: Skin is warm and dry. No rash noted. He is not diaphoretic. No erythema. No pallor.  he right cholecystostomy surgical scar. The medial  left lower leg surgical scar from previous melanoma removal. Top of left head skin lesion 2x2x1cm rough surface, inflamed base.  Posterior right lower chest wall area 3"  diameter inflamed sebaceous cyst that is tender to palpation. Erythema is noted. I+D  Psychiatric: Thought content normal. His mood appears anxious. His affect is not angry and not inappropriate. His speech is not rapid and/or pressured, not delayed and not slurred. He is agitated. He is not aggressive, not hyperactive, not slowed and not withdrawn. Cognition and memory are impaired. He expresses impulsivity and inappropriate judgment. He does not exhibit a depressed mood. He exhibits abnormal recent memory. He exhibits normal remote memory.    Labs reviewed: Basic Metabolic Panel: No results for input(s): NA, K, CL, CO2, GLUCOSE, BUN, CREATININE, CALCIUM, MG, PHOS in the last 8760 hours.  Liver Function Tests: No results for input(s): AST, ALT, ALKPHOS, BILITOT, PROT, ALBUMIN in the last 8760 hours.  CBC: No results for input(s): WBC, NEUTROABS, HGB, HCT, MCV, PLT in the last 8760 hours.  Lab Results  Component Value Date   TSH 2.34 09/13/2013   Lab Results  Component Value Date   HGBA1C 5.6 02/03/2014   Lab Results  Component Value Date   CHOL 129 02/03/2014   HDL 45 02/03/2014   LDLCALC 64 02/03/2014   LDLDIRECT 139.4 04/26/2007   TRIG 99 02/03/2014   CHOLHDL 2.9 02/03/2014    Significant Diagnostic Results since last visit: none  Patient Care Team: Estill Dooms, MD as PCP - General (Internal Medicine) Kathrynn Ducking, MD as Consulting Physician (Neurology) Fanny Skates, MD as Consulting Physician (General Surgery) Irene Shipper, MD as Consulting Physician (Gastroenterology) Thayer Headings, MD as Consulting Physician (Cardiology) Tanda Rockers, MD as Consulting Physician (Pulmonary Disease)  Assessment/Plan Problem List Items Addressed This Visit    Infected sebaceous cyst of skin - Primary     Posterior lower right chest wall, I+D, adding Doxycycline 100mg  bid x 10 days, dry dressing daily.       Essential hypertension    Controlled, takes Metoprolol 25mg  bid.       CVA (cerebral vascular accident) ([place])    Right sided weakness with right wrist/hand contracture. Grip strength is still 5/5. Ambulates with walker and w/c to go further. New onset of decreased the left sided coordination 01/2014. C/o worsened in his right sided weakness and stiffness in his left side.  Constipation    Stable, continue Biscolax 10mg  suppository q12h prn.       Atrial fibrillation (HCC)    Atrial fibrillation remained rate controlled. Patient was placed on Eliquis per neurology's recommendations. Continue Metoprolol 25mg  bid.           Family/ staff Communication: observe the posterior right lower chest wall wound.   Labs/tests ordered:  none  ManXie Jaxxson Cavanah NP [facility] [address], [place] [address] On Call:  [phone] & follow prompts after 5pm & weekends Office Phone:  [phone] Office Fax:  [phone]

## 2015-09-08 NOTE — Assessment & Plan Note (Signed)
Atrial fibrillation remained rate controlled. Patient was placed on Eliquis per neurology's recommendations. Continue Metoprolol 25mg  bid.

## 2015-09-26 ENCOUNTER — Non-Acute Institutional Stay (SKILLED_NURSING_FACILITY): Payer: Medicare Other | Admitting: Internal Medicine

## 2015-09-26 ENCOUNTER — Encounter: Payer: Self-pay | Admitting: Internal Medicine

## 2015-09-26 DIAGNOSIS — Z789 Other specified health status: Secondary | ICD-10-CM

## 2015-09-26 DIAGNOSIS — L723 Sebaceous cyst: Secondary | ICD-10-CM | POA: Diagnosis not present

## 2015-09-26 DIAGNOSIS — I10 Essential (primary) hypertension: Secondary | ICD-10-CM

## 2015-09-26 DIAGNOSIS — R21 Rash and other nonspecific skin eruption: Secondary | ICD-10-CM | POA: Diagnosis not present

## 2015-09-26 DIAGNOSIS — L089 Local infection of the skin and subcutaneous tissue, unspecified: Secondary | ICD-10-CM

## 2015-09-26 NOTE — Progress Notes (Signed)
Patient ID: [name], male   DOB: 01-25-1919, 81 y.o.   MRN: [id number]    [place] Room Number: 8  Place of Service: SNF (31)     Allergies  Allergen Reactions  . Sulfonamide Derivatives Rash    REACTION: rash    Chief Complaint  Patient presents with  . Acute Visit    Draining sebaceous cyst right scapular area    HPI:  I was asked to see patient on an acute basis by the nurse because of a continuing issue of drainage from a inflamed sebaceous cyst of the right scapular area. He also has a previous cyst which has healed in the left upper scapular area and complains of itching in that area. Prior inflammation is caused a significant breakdown of the skin with scaling and itching in that area.  Patient has completed a course of doxycycline 100 mg twice daily between 09/08/2015 and 09/18/2015.  He is otherwise feeling well.  Medications: Patient's Medications  New Prescriptions   No medications on file  Previous Medications   ACETAMINOPHEN (TYLENOL) 325 MG TABLET    Take 650 mg by mouth every 4 (four) hours as needed.    APIXABAN (ELIQUIS) 5 MG TABS TABLET    Take 1 tablet (5 mg total) by mouth 2 (two) times daily.   BISACODYL (DULCOLAX) 10 MG SUPPOSITORY    Place 10 mg rectally every 12 (twelve) hours as needed for moderate constipation.   CARBAMIDE PEROXIDE (DEBROX) 6.5 % OTIC SOLUTION    5 drops. 5 drops to each ear at bedtime on the 21st of each month   DOXYCYCLINE (VIBRAMYCIN) 100 MG CAPSULE    Take one twice daily for 10 days started 09/08/15   HYDROXYPROPYL METHYLCELLULOSE (ISOPTO TEARS) 2.5 % OPHTHALMIC SOLUTION    Place 1 drop into both eyes 4 (four) times daily as needed (eye irritation).    METOPROLOL TARTRATE (LOPRESSOR) 25 MG TABLET    Take 25 mg by mouth 2 (two) times daily.   WHITE PETROLATUM-MINERAL OIL (SYSTANE NIGHTTIME) OINT    Place 1 application into both eyes at bedtime.  Modified Medications   No medications on  file  Discontinued Medications   ALUM & MAG HYDROXIDE-SIMETH (MINTOX) 200-200-20 MG/5ML SUSPENSION    Take 30 mLs by mouth every 6 (six) hours as needed for indigestion or heartburn.   CEPHALEXIN (KEFLEX) 500 MG CAPSULE    One 3 times daily for infection   FUROSEMIDE (LASIX) 20 MG TABLET       OMEPRAZOLE (PRILOSEC) 20 MG CAPSULE    Take 20 mg by mouth every morning.    POLYETHYLENE GLYCOL (MIRALAX / GLYCOLAX) PACKET    Take 17 g by mouth every other day.   SACCHAROMYCES BOULARDII (FLORASTOR) 250 MG CAPSULE    One daily to help prevent Clostridium difficile infection   WARFARIN (COUMADIN) 4 MG TABLET         Review of Systems  Constitutional: Negative for fever and chills.  HENT: Positive for hearing loss. Negative for congestion, ear discharge, ear pain and nosebleeds.   Eyes: Negative for pain, discharge and redness.  Respiratory: Negative for cough, shortness of breath and wheezing.   Cardiovascular: Positive for leg swelling. Negative for chest pain and palpitations.       Trace  Gastrointestinal: Negative for nausea, vomiting, abdominal pain and constipation.  Genitourinary: Positive for frequency. Negative for dysuria and urgency.  Musculoskeletal: Negative for myalgias, back pain and neck pain.  Skin: Negative for rash.       Dry scaly skin. Top of left head skin lesion 2x2x1cm rough surface, inflamed base.  2+ diameter inflamed right scapular area sebaceous cyst with waxy and creamy drainage.  Allergic/Immunologic: Negative for environmental allergies.  Neurological: Negative for dizziness, tremors, seizures, weakness and headaches.  Hematological: Bruises/bleeds easily.  Psychiatric/Behavioral: Negative for suicidal ideas and hallucinations. The patient is nervous/anxious.     Filed Vitals:   09/26/15 1116  BP: 150/80  Pulse: 66  Temp: 97.8 F (36.6 C)  Resp: 20  Height: 5' 3"  (1.6 m)  Weight: 163 lb (73.936 kg)   Body mass index is 28.88 kg/(m^2).  Physical Exam    Constitutional: He is oriented to person, place, and time. He appears well-developed and well-nourished. No distress.  HENT:  Head: Normocephalic and atraumatic.  Right Ear: External ear normal.  Left Ear: External ear normal.  Nose: Nose normal.  Mouth/Throat: No oropharyngeal exudate.  Eyes: Conjunctivae are normal. Pupils are equal, round, and reactive to light. Right eye exhibits no discharge. No scleral icterus.  Left eye blindness. The right lower eyelid ectropion.   Neck: Normal range of motion. Neck supple. No JVD present. No tracheal deviation present. No thyromegaly present.  Cardiovascular: Normal rate and normal heart sounds.  An irregular rhythm present.  Pulmonary/Chest: Effort normal and breath sounds normal. No respiratory distress. He has no wheezes. He has no rales. He exhibits no tenderness.  Abdominal: He exhibits no distension. There is no tenderness.  Musculoskeletal: Normal range of motion. He exhibits edema. He exhibits no tenderness.  The right sided weakness since previous CVA-muscle strength is still 5/5. The right wrist and hand contractures-able to make fist and cannot extend fully. No longer ambulates with walker and w/c for mobility Trace pedal edema R>L Left sided weakness from the recent CVA-mild-grip strength 5/5  Neurological: He is alert and oriented to person, place, and time. He displays abnormal reflex. No cranial nerve deficit. Coordination normal.  the left sided decreased coordination-cannot transfer self independently and dropped his utensil, grip strength is still 5/5. Hx of right sided weakness with R hand contracture. Left facial weakness.   Skin: Skin is warm and dry. No rash noted. He is not diaphoretic. No erythema. No pallor.  Right cholecystostomy surgical scar. Medial left lower leg surgical scar from previous melanoma removal. Top of left head skin lesion 2x2x1cm rough surface, inflamed base.  Right scapular area 2"  diameter inflamed  sebaceous cyst that is tender to palpation. Draining waxy and creamy material. Erythema is noted. Left upper scapular area has crusted dry skin and a healed sebaceous cyst the previously was inflamed.  Psychiatric: Thought content normal. His mood appears anxious. His affect is not angry and not inappropriate. His speech is not rapid and/or pressured, not delayed and not slurred. He is agitated. He is not aggressive, not hyperactive, not slowed and not withdrawn. Cognition and memory are impaired. He expresses impulsivity and inappropriate judgment. He does not exhibit a depressed mood. He exhibits abnormal recent memory. He exhibits normal remote memory.     Labs reviewed: Lab Summary Latest Ref Rng 04/12/2014  Hemoglobin 13.5 - 17.5 g/dL 12.0(A)  Hematocrit 41 - 53 % 36(A)  White count - 7.8  Platelet count 150 - 399 K/L 268  Sodium 137 - 147 mmol/L 138  Potassium 3.4 - 5.3 mmol/L 5.1  Calcium - (None)  Phosphorus - (None)  Creatinine 0.6 - 1.3 mg/dL 1.1  AST 14 -  40 U/L 12(A)  Alk Phos 25 - 125 U/L 105  Bilirubin - (None)  Glucose - 84  Cholesterol - (None)  HDL cholesterol - (None)  Triglycerides - (None)  LDL Direct - (None)  LDL Calc - (None)  Total protein - (None)  Albumin - (None)   Lab Results  Component Value Date   TSH 2.34 09/13/2013   Lab Results  Component Value Date   BUN 32* 04/12/2014   Lab Results  Component Value Date   HGBA1C 5.6 02/03/2014       Assessment/Plan  1. Infected sebaceous cyst of skin -Culture drainage  2. Rash and nonspecific skin eruption -Triamcinolone acetonide cream 1% applied daily to itching areas.  3. Essential hypertension Mild elevation in systolic blood pressure. Continue to observe.  4. Active advance directive - DNR (Do Not Resuscitate)

## 2015-09-30 LAB — BASIC METABOLIC PANEL
BUN: 21 mg/dL (ref 4–21)
Creatinine: 1.1 mg/dL (ref 0.6–1.3)
GLUCOSE: 79 mg/dL
Potassium: 5 mmol/L (ref 3.4–5.3)
SODIUM: 136 mmol/L — AB (ref 137–147)

## 2015-09-30 LAB — CBC AND DIFFERENTIAL
HCT: 37 % — AB (ref 41–53)
Hemoglobin: 12 g/dL — AB (ref 13.5–17.5)
Platelets: 267 10*3/uL (ref 150–399)
WBC: 7.3 10^3/mL

## 2015-09-30 LAB — HEPATIC FUNCTION PANEL
ALT: 9 U/L — AB (ref 10–40)
AST: 14 U/L (ref 14–40)
Alkaline Phosphatase: 71 U/L (ref 25–125)
Bilirubin, Total: 0.4 mg/dL

## 2015-10-01 ENCOUNTER — Other Ambulatory Visit: Payer: Self-pay | Admitting: Nurse Practitioner

## 2015-10-01 DIAGNOSIS — D638 Anemia in other chronic diseases classified elsewhere: Secondary | ICD-10-CM

## 2015-10-01 DIAGNOSIS — D649 Anemia, unspecified: Secondary | ICD-10-CM

## 2015-10-15 ENCOUNTER — Non-Acute Institutional Stay (SKILLED_NURSING_FACILITY): Payer: Medicare Other | Admitting: Nurse Practitioner

## 2015-10-15 ENCOUNTER — Encounter: Payer: Self-pay | Admitting: Nurse Practitioner

## 2015-10-15 DIAGNOSIS — K5900 Constipation, unspecified: Secondary | ICD-10-CM

## 2015-10-15 DIAGNOSIS — I10 Essential (primary) hypertension: Secondary | ICD-10-CM

## 2015-10-15 DIAGNOSIS — L723 Sebaceous cyst: Secondary | ICD-10-CM | POA: Diagnosis not present

## 2015-10-15 DIAGNOSIS — I4821 Permanent atrial fibrillation: Secondary | ICD-10-CM

## 2015-10-15 DIAGNOSIS — I482 Chronic atrial fibrillation: Secondary | ICD-10-CM

## 2015-10-15 DIAGNOSIS — R609 Edema, unspecified: Secondary | ICD-10-CM

## 2015-10-15 DIAGNOSIS — L089 Local infection of the skin and subcutaneous tissue, unspecified: Secondary | ICD-10-CM

## 2015-10-15 NOTE — Progress Notes (Signed)
Patient ID: [name], male   DOB: 07-25-1919, 81 y.o.   MRN: [id number]  Location:  SNF [facility] Provider:  Marlana Latus NP  Code Status:  DNR Goals of care: Advanced Directive information    Chief Complaint  Patient presents with  . Medical Management of Chronic Issues     HPI: Patient is a 81 y.o. male seen in the SNF at [facility] today for evaluation of chronic medical conditions, Hx of late effect of CVAs with R+L sided weakness, heart rate controlled A-fib, normalized HTN.   Review of Systems  Constitutional: Negative for fever and chills.  HENT: Positive for hearing loss. Negative for congestion, ear discharge, ear pain and nosebleeds.   Eyes: Negative for pain, discharge and redness.  Respiratory: Negative for cough, shortness of breath and wheezing.   Cardiovascular: Positive for leg swelling. Negative for chest pain and palpitations.       Trace  Gastrointestinal: Negative for nausea, vomiting, abdominal pain and constipation.  Genitourinary: Positive for frequency. Negative for dysuria and urgency.  Musculoskeletal: Negative for myalgias, back pain and neck pain.  Skin: Negative for rash.       Dry scaly skin. Top of left head skin lesion 2x2x1cm rough surface  Neurological: Negative for dizziness, tremors, seizures, weakness and headaches.  Endo/Heme/Allergies: Negative for environmental allergies. Bruises/bleeds easily.  Psychiatric/Behavioral: Positive for memory loss. Negative for suicidal ideas and hallucinations. The patient is nervous/anxious.     Past Medical History  Diagnosis Date  . GERD (gastroesophageal reflux disease)   . Hypertension   . Hearing loss     wears hearing aid - right side  . Leg swelling   . Dysphagia   . Bruises easily     due to coumadin  . Weakness     difficulty walking  . Contact lens/glasses fitting   . Stroke [facility]) 1999, 02/02/14  . Cancer ([place])   . 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 Gait disturbance   . COPD (chronic  obstructive pulmonary disease) ([place])   . Peripheral vascular disease ([place])   . History of melanoma   . Diverticulosis   . Abnormality of gait 05/16/2013  . Choledocholithiasis with obstruction 06/28/13  . Embolic stroke ([place]) 0/24/09  . PAF (paroxysmal atrial fibrillation) ([place])   . Hyperlipidemia   . Bradycardia   . Protein-calorie malnutrition, severe ([place])   . Ventricular tachycardia (paroxysmal) ([place])   . Obstructive sleep apnea of adult     uses cpap, pt does not know setting  . Personal history of fall 05/29/2013  . Fracture of femoral neck, right, closed 3114  . Long term (current) use of anticoagulants     PAF and embolic CVA  . Anemia, unspecified 07/05/2013  . Xerophthalmia 07/05/2013  . 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 Actinic keratosis   . Seborrheic keratosis   . Aortic valve stenosis   . Cerebrovascular disease   . Elevated liver enzymes   . Diverticulosis   . Herpes zoster 02/08/14    Left facial nerve distribution  . Right hemiparesis ([place])   . Melanoma ([place]) 2009  . Hyperglycemia   . Xerophthalmia     Patient Active Problem List   Diagnosis Date Noted  . Infected sebaceous cyst of skin 09/05/2015  . Skin lesion of scalp 02/26/2015  . Rash and nonspecific skin eruption 11/18/2014  . Dry skin dermatitis 08/21/2014  . Ectropion of left lower eyelid 07/03/2014  . Osteoarthritis of hand, left 07/03/2014  .  Dysphagia, unspecified(787.20) 02/18/2014  . Hyperglycemia   . Acute ischemic stroke ([place]) 02/04/2014  . TIA (transient ischemic attack) 02/03/2014  . Hypoxia 02/02/2014  . Hypotension 02/02/2014  . Left-sided weakness 02/02/2014  . Edema 12/20/2013  . Constipation 08/15/2013  . Anemia of chronic disease 08/10/2013  . Anemia, unspecified 07/05/2013  . Atrial fibrillation ([place]) 06/30/2013  . Hemiparesis as late effect of cerebrovascular accident (CVA) ([place]) 01/19/2011  . Long term current use of anticoagulant therapy 01/19/2011  . GERD 08/19/2008  .  Essential hypertension 09/21/2007    Allergies  Allergen Reactions  . Sulfonamide Derivatives Rash    REACTION: rash    Medications: Patient's Medications  New Prescriptions   No medications on file  Previous Medications   ACETAMINOPHEN (TYLENOL) 325 MG TABLET    Take 650 mg by mouth every 4 (four) hours as needed.    APIXABAN (ELIQUIS) 5 MG TABS TABLET    Take 1 tablet (5 mg total) by mouth 2 (two) times daily.   BISACODYL (DULCOLAX) 10 MG SUPPOSITORY    Place 10 mg rectally every 12 (twelve) hours as needed for moderate constipation.   CARBAMIDE PEROXIDE (DEBROX) 6.5 % OTIC SOLUTION    5 drops. 5 drops to each ear at bedtime on the 21st of each month   DOXYCYCLINE (VIBRAMYCIN) 100 MG CAPSULE    Take one twice daily for 10 days started 09/08/15   HYDROXYPROPYL METHYLCELLULOSE (ISOPTO TEARS) 2.5 % OPHTHALMIC SOLUTION    Place 1 drop into both eyes 4 (four) times daily as needed (eye irritation).    METOPROLOL TARTRATE (LOPRESSOR) 25 MG TABLET    Take 25 mg by mouth 2 (two) times daily.   WHITE PETROLATUM-MINERAL OIL (SYSTANE NIGHTTIME) OINT    Place 1 application into both eyes at bedtime.  Modified Medications   No medications on file  Discontinued Medications   No medications on file    Physical Exam: Filed Vitals:   10/15/15 1431  BP: 124/68  Pulse: 70  Temp: 98.8 F (37.1 C)  TempSrc: Tympanic  Resp: 18   There is no weight on file to calculate BMI.  Physical Exam  Constitutional: He is oriented to person, place, and time. He appears well-developed and well-nourished. No distress.  HENT:  Head: Normocephalic and atraumatic.  Right Ear: External ear normal.  Left Ear: External ear normal.  Nose: Nose normal.  Mouth/Throat: No oropharyngeal exudate.  Eyes: Conjunctivae are normal. Pupils are equal, round, and reactive to light. Right eye exhibits no discharge. No scleral icterus.  Left eye blindness. The right lower eyelid ectropion.   Neck: Normal range of motion.  Neck supple. No JVD present. No tracheal deviation present. No thyromegaly present.  Cardiovascular: Normal rate and normal heart sounds.  An irregular rhythm present.  Pulmonary/Chest: Effort normal and breath sounds normal. No respiratory distress. He has no wheezes. He has no rales. He exhibits no tenderness.  Abdominal: He exhibits no distension. There is no tenderness.  Musculoskeletal: Normal range of motion. He exhibits edema. He exhibits no tenderness.  The right sided weakness since previous CVA-muscle strength is still 5/5. The right wrist and hand contractures-able to make fist and cannot extend fully. No longer ambulates with walker and w/c for mobility Trace pedal edema R>L Left sided weakness from the recent CVA-mild-grip strength 5/5  Neurological: He is alert and oriented to person, place, and time. He displays abnormal reflex. No cranial nerve deficit. Coordination normal.  the left sided decreased coordination-cannot transfer self  independently and dropped his utensil, grip strength is still 5/5. Hx of right sided weakness with R hand contracture. Left facial weakness.   Skin: Skin is warm and dry. No rash noted. He is not diaphoretic. No erythema. No pallor.  he right cholecystostomy surgical scar. The medial left lower leg surgical scar from previous melanoma removal. Top of left head skin lesion 2x2x1cm rough surface, inflamed base.   Psychiatric: Thought content normal. His mood appears anxious. His affect is not angry and not inappropriate. His speech is not rapid and/or pressured, not delayed and not slurred. He is agitated. He is not aggressive, not hyperactive, not slowed and not withdrawn. Cognition and memory are impaired. He expresses impulsivity and inappropriate judgment. He does not exhibit a depressed mood. He exhibits abnormal recent memory. He exhibits normal remote memory.    Labs reviewed: Basic Metabolic Panel:  Recent Labs  09/30/15  NA 136*  K 5.0  BUN 21    CREATININE 1.1    Liver Function Tests:  Recent Labs  09/30/15  AST 14  ALT 9*  ALKPHOS 71    CBC:  Recent Labs  09/30/15  WBC 7.3  HGB 12.0*  HCT 37*  PLT 267    Lab Results  Component Value Date   TSH 2.34 09/13/2013   Lab Results  Component Value Date   HGBA1C 5.6 02/03/2014   Lab Results  Component Value Date   CHOL 129 02/03/2014   HDL 45 02/03/2014   LDLCALC 64 02/03/2014   LDLDIRECT 139.4 04/26/2007   TRIG 99 02/03/2014   CHOLHDL 2.9 02/03/2014    Significant Diagnostic Results since last visit: none  Patient Care Team: Estill Dooms, MD as PCP - General (Internal Medicine) Kathrynn Ducking, MD as Consulting Physician (Neurology) Fanny Skates, MD as Consulting Physician (General Surgery) Irene Shipper, MD as Consulting Physician (Gastroenterology) Thayer Headings, MD as Consulting Physician (Cardiology) Tanda Rockers, MD as Consulting Physician (Pulmonary Disease)  Assessment/Plan Problem List Items Addressed This Visit    Essential hypertension - Primary (Chronic)    Controlled, continue Metoprolol 25mg  bid      Edema (Chronic)    On and off BLE      Infected sebaceous cyst of skin (Chronic)    Healed.       Atrial fibrillation (HCC)    Atrial fibrillation remained rate controlled. Patient was placed on Eliquis per neurology's recommendations. Continue Metoprolol 25mg  bid.       Constipation    Stable, continue Biscolax 10mg  suppository q12h prn.           Family/ staff Communication: observe the posterior right lower chest wall wound.   Labs/tests ordered:  none  ManXie Terica Yogi NP [facility] [address], [place] [address] On Call:  [phone] & follow prompts after 5pm & weekends Office Phone:  [phone] Office Fax:  [phone]

## 2015-10-16 NOTE — Assessment & Plan Note (Signed)
Atrial fibrillation remained rate controlled. Patient was placed on Eliquis per neurology's recommendations. Continue Metoprolol 25mg bid.  

## 2015-10-16 NOTE — Assessment & Plan Note (Signed)
Stable, continue Biscolax 10mg suppository q12h prn.  

## 2015-10-16 NOTE — Assessment & Plan Note (Signed)
Controlled, continue Metoprolol 25mg bid 

## 2015-10-16 NOTE — Assessment & Plan Note (Signed)
Right sided weakness with right wrist/hand contracture. Grip strength is still 5/5. Ambulates with walker and w/c to go further. New onset of decreased the left sided coordination 01/2014. C/o worsened in his right sided weakness and stiffness in his left side. Continue anticoagulation therapy.

## 2015-10-16 NOTE — Assessment & Plan Note (Signed)
On and off BLE

## 2015-10-16 NOTE — Assessment & Plan Note (Signed)
Healed

## 2015-11-21 ENCOUNTER — Encounter: Payer: Self-pay | Admitting: Internal Medicine

## 2015-11-21 ENCOUNTER — Non-Acute Institutional Stay (SKILLED_NURSING_FACILITY): Payer: Medicare Other | Admitting: Internal Medicine

## 2015-11-21 DIAGNOSIS — M6289 Other specified disorders of muscle: Secondary | ICD-10-CM

## 2015-11-21 DIAGNOSIS — D638 Anemia in other chronic diseases classified elsewhere: Secondary | ICD-10-CM | POA: Diagnosis not present

## 2015-11-21 DIAGNOSIS — M24531 Contracture, right wrist: Secondary | ICD-10-CM

## 2015-11-21 DIAGNOSIS — I10 Essential (primary) hypertension: Secondary | ICD-10-CM

## 2015-11-21 DIAGNOSIS — I482 Chronic atrial fibrillation: Secondary | ICD-10-CM | POA: Diagnosis not present

## 2015-11-21 DIAGNOSIS — I4821 Permanent atrial fibrillation: Secondary | ICD-10-CM

## 2015-11-21 DIAGNOSIS — M24539 Contracture, unspecified wrist: Secondary | ICD-10-CM | POA: Insufficient documentation

## 2015-11-21 DIAGNOSIS — R531 Weakness: Secondary | ICD-10-CM

## 2015-11-21 NOTE — Progress Notes (Signed)
Patient ID: [name], male   DOB: 30-Sep-1919, 81 y.o.   MRN: [id number]    [place] Room Number: 86  Place of Service: SNF (31)     Allergies  Allergen Reactions  . Sulfonamide Derivatives Rash    REACTION: rash    Chief Complaint  Patient presents with  . Medical Management of Chronic Issues    HPI:  Patient is doing well since I last saw him, but he is concerned about the developing contractures in his right wrist and elbow. He also has a contracture of the right knee, but this concerns him less. He has a splint for the right wrist, but he does not often wear it.  Atrial fibrillation is rate controlled and anticoagulated.  Blood pressure is under decent control.  Edema has improved.  Previously infected sebaceous cyst of the back has resolved.  Anemia of chronic disease remained stable with hemoglobin about 12.  Medications: Patient's Medications  New Prescriptions   No medications on file  Previous Medications   ACETAMINOPHEN (TYLENOL) 325 MG TABLET    Take 650 mg by mouth every 4 (four) hours as needed.    APIXABAN (ELIQUIS) 5 MG TABS TABLET    Take 1 tablet (5 mg total) by mouth 2 (two) times daily.   BISACODYL (DULCOLAX) 10 MG SUPPOSITORY    Place 10 mg rectally every 12 (twelve) hours as needed for moderate constipation.   CARBAMIDE PEROXIDE (DEBROX) 6.5 % OTIC SOLUTION    5 drops. 5 drops to each ear at bedtime on the 21st of each month   METOPROLOL TARTRATE (LOPRESSOR) 25 MG TABLET    Take 25 mg by mouth 2 (two) times daily.   POLYETHYL GLYCOL-PROPYL GLYCOL (SYSTANE) 0.4-0.3 % SOLN    Apply to eye. One drop both eye four times daily   WHITE PETROLATUM-MINERAL OIL (SYSTANE NIGHTTIME) OINT    Place 1 application into both eyes at bedtime.  Modified Medications   No medications on file  Discontinued Medications   DOXYCYCLINE (VIBRAMYCIN) 100 MG CAPSULE    Take one twice daily for 10 days started 09/08/15   HYDROXYPROPYL  METHYLCELLULOSE (ISOPTO TEARS) 2.5 % OPHTHALMIC SOLUTION    Place 1 drop into both eyes 4 (four) times daily as needed (eye irritation).      Review of Systems  Constitutional: Negative for fever and chills.  HENT: Positive for hearing loss. Negative for congestion, ear discharge, ear pain and nosebleeds.   Eyes: Negative for pain, discharge and redness.  Respiratory: Negative for cough, shortness of breath and wheezing.   Cardiovascular: Positive for leg swelling. Negative for chest pain and palpitations.       Trace  Gastrointestinal: Negative for nausea, vomiting, abdominal pain and constipation.  Genitourinary: Positive for frequency. Negative for dysuria and urgency.  Musculoskeletal: Negative for myalgias, back pain and neck pain.  Skin: Negative for rash.       Dry scaly skin. Top of left head skin lesion 2x2x1cm rough surface, inflamed base.  2+ diameter inflamed right scapular area sebaceous cyst with waxy and creamy drainage.  Allergic/Immunologic: Negative for environmental allergies.  Neurological: Negative for dizziness, tremors, seizures, weakness and headaches.  Hematological: Bruises/bleeds easily.  Psychiatric/Behavioral: Negative for suicidal ideas and hallucinations. The patient is nervous/anxious.     Filed Vitals:   11/21/15 1531  BP: 130/70  Pulse: 66  Temp: 97.3 F (36.3 C)  Resp: 20  Height: 5' 3"  (1.6 m)  Weight: 163  lb (73.936 kg)   Body mass index is 28.88 kg/(m^2).  Physical Exam  Constitutional: He is oriented to person, place, and time. He appears well-developed and well-nourished. No distress.  HENT:  Head: Normocephalic and atraumatic.  Right Ear: External ear normal.  Left Ear: External ear normal.  Nose: Nose normal.  Mouth/Throat: No oropharyngeal exudate.  Eyes: Conjunctivae are normal. Pupils are equal, round, and reactive to light. Right eye exhibits no discharge. No scleral icterus.  Left eye blindness. The right lower eyelid  ectropion.   Neck: Normal range of motion. Neck supple. No JVD present. No tracheal deviation present. No thyromegaly present.  Cardiovascular: Normal rate and normal heart sounds.  An irregular rhythm present.  Pulmonary/Chest: Effort normal and breath sounds normal. No respiratory distress. He has no wheezes. He has no rales. He exhibits no tenderness.  Abdominal: He exhibits no distension. There is no tenderness.  Musculoskeletal: Normal range of motion. He exhibits edema. He exhibits no tenderness.  The right sided weakness since previous CVA-muscle strength is still 5/5. The right wrist and hand contractures-able to make fist and cannot extend fully. No longer ambulates with walker and w/c for mobility Trace pedal edema R>L Left sided weakness from the recent CVA-mild-grip strength 5/5  Neurological: He is alert and oriented to person, place, and time. He displays abnormal reflex. No cranial nerve deficit. Coordination normal.  the left sided decreased coordination-cannot transfer self independently and dropped his utensil, grip strength is still 5/5. Hx of right sided weakness with R hand contracture. Left facial weakness.   Skin: Skin is warm and dry. No rash noted. He is not diaphoretic. No erythema. No pallor.  Right cholecystostomy surgical scar. Medial left lower leg surgical scar from previous melanoma removal. Top of left head skin lesion 2x2x1cm rough surface, inflamed base.  Right scapular area 2"  diameter inflamed sebaceous cyst that is tender to palpation. Draining waxy and creamy material. Erythema is noted. Left upper scapular area has crusted dry skin and a healed sebaceous cyst the previously was inflamed.  Psychiatric: Thought content normal. His mood appears anxious. His affect is not angry and not inappropriate. His speech is not rapid and/or pressured, not delayed and not slurred. He is agitated. He is not aggressive, not hyperactive, not slowed and not withdrawn. Cognition and  memory are impaired. He expresses impulsivity and inappropriate judgment. He does not exhibit a depressed mood. He exhibits abnormal recent memory. He exhibits normal remote memory.     Labs reviewed: Lab Summary Latest Ref Rng 09/30/2015 04/12/2014  Hemoglobin 13.5 - 17.5 g/dL 12.0(A) 12.0(A)  Hematocrit 41 - 53 % 37(A) 36(A)  White count - 7.3 7.8  Platelet count 150 - 399 K/L 267 268  Sodium 137 - 147 mmol/L 136(A) 138  Potassium 3.4 - 5.3 mmol/L 5.0 5.1  Calcium - (None) (None)  Phosphorus - (None) (None)  Creatinine 0.6 - 1.3 mg/dL 1.1 1.1  AST 14 - 40 U/L 14 12(A)  Alk Phos 25 - 125 U/L 71 105  Bilirubin - (None) (None)  Glucose - 79 84  Cholesterol - (None) (None)  HDL cholesterol - (None) (None)  Triglycerides - (None) (None)  LDL Direct - (None) (None)  LDL Calc - (None) (None)  Total protein - (None) (None)  Albumin - (None) (None)   Lab Results  Component Value Date   TSH 2.34 09/13/2013   Lab Results  Component Value Date   BUN 21 09/30/2015   Lab Results  Component  Value Date   HGBA1C 5.6 02/03/2014       Assessment/Plan  1. Essential hypertension Controlled  2. Anemia of chronic disease Stable  3. Permanent atrial fibrillation (HCC) Controlled and anticoagulated  4. Weakness of right side of body Has worked with physical therapy  5. Contracture of joint of forearm, right Has worked with physical therapy and he says they're coming back to reassess his contractures.

## 2015-12-02 ENCOUNTER — Encounter: Payer: Self-pay | Admitting: Nurse Practitioner

## 2015-12-02 ENCOUNTER — Non-Acute Institutional Stay (SKILLED_NURSING_FACILITY): Payer: Medicare Other | Admitting: Nurse Practitioner

## 2015-12-02 DIAGNOSIS — K5900 Constipation, unspecified: Secondary | ICD-10-CM

## 2015-12-02 DIAGNOSIS — I10 Essential (primary) hypertension: Secondary | ICD-10-CM | POA: Diagnosis not present

## 2015-12-02 DIAGNOSIS — K219 Gastro-esophageal reflux disease without esophagitis: Secondary | ICD-10-CM

## 2015-12-02 DIAGNOSIS — I482 Chronic atrial fibrillation: Secondary | ICD-10-CM

## 2015-12-02 DIAGNOSIS — I69359 Hemiplegia and hemiparesis following cerebral infarction affecting unspecified side: Secondary | ICD-10-CM | POA: Diagnosis not present

## 2015-12-02 DIAGNOSIS — I4821 Permanent atrial fibrillation: Secondary | ICD-10-CM

## 2015-12-02 DIAGNOSIS — R609 Edema, unspecified: Secondary | ICD-10-CM

## 2015-12-02 NOTE — Progress Notes (Signed)
Patient ID: [name], male   DOB: Oct 15, 1919, 81 y.o.   MRN: [id number]  Location:  SNF [facility] Provider:  Marlana Latus NP  Code Status:  DNR Goals of care: Advanced Directive information    Chief Complaint  Patient presents with  . Medical Management of Chronic Issues     HPI: Patient is a 81 y.o. male seen in the SNF at [facility] today for evaluation of Hx of late effect of CVAs with R+L sided weakness, heart rate controlled A-fib, normalized HTN.   Review of Systems  Constitutional: Negative for fever and chills.  HENT: Positive for hearing loss. Negative for congestion, ear discharge, ear pain and nosebleeds.   Eyes: Negative for pain, discharge and redness.  Respiratory: Negative for cough, shortness of breath and wheezing.   Cardiovascular: Positive for leg swelling. Negative for chest pain and palpitations.       Trace  Gastrointestinal: Negative for nausea, vomiting, abdominal pain and constipation.  Genitourinary: Positive for frequency. Negative for dysuria and urgency.  Musculoskeletal: Negative for myalgias, back pain and neck pain.  Skin: Negative for rash.       Dry scaly skin. Top of left head skin lesion 2x2x1cm rough surface  Neurological: Negative for dizziness, tremors, seizures, weakness and headaches.  Endo/Heme/Allergies: Negative for environmental allergies. Bruises/bleeds easily.  Psychiatric/Behavioral: Positive for memory loss. Negative for suicidal ideas and hallucinations. The patient is nervous/anxious.     Past Medical History  Diagnosis Date  . GERD (gastroesophageal reflux disease)   . Hypertension   . Hearing loss     wears hearing aid - right side  . Leg swelling   . Dysphagia   . Bruises easily     due to coumadin  . Weakness     difficulty walking  . Contact lens/glasses fitting   . Stroke [facility]) 1999, 02/02/14  . Cancer ([place])   . 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 Gait disturbance   . COPD (chronic obstructive pulmonary disease)  ([place])   . Peripheral vascular disease ([place])   . History of melanoma   . Diverticulosis   . Abnormality of gait 05/16/2013  . Choledocholithiasis with obstruction 06/28/13  . Embolic stroke ([place]) [id number]  . PAF (paroxysmal atrial fibrillation) ([place])   . Hyperlipidemia   . Bradycardia   . Protein-calorie malnutrition, severe ([place])   . Ventricular tachycardia (paroxysmal) ([place])   . Obstructive sleep apnea of adult     uses cpap, pt does not know setting  . Personal history of fall 05/29/2013  . Fracture of femoral neck, right, closed 3114  . Long term (current) use of anticoagulants     PAF and embolic CVA  . Anemia, unspecified 07/05/2013  . Xerophthalmia 07/05/2013  . 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 Actinic keratosis   . Seborrheic keratosis   . Aortic valve stenosis   . Cerebrovascular disease   . Elevated liver enzymes   . Diverticulosis   . Herpes zoster 02/08/14    Left facial nerve distribution  . Right hemiparesis ([place])   . Melanoma ([place]) 2009  . Hyperglycemia   . Xerophthalmia     Patient Active Problem List   Diagnosis Date Noted  . Weakness of right side of body 11/21/2015  . Contracture of joint of forearm 11/21/2015  . Infected sebaceous cyst of skin 09/05/2015  . Skin lesion of scalp 02/26/2015  . Rash and nonspecific skin eruption 11/18/2014  . Dry skin dermatitis 08/21/2014  .  Ectropion of left lower eyelid 07/03/2014  . Osteoarthritis of hand, left 07/03/2014  . Dysphagia, unspecified(787.20) 02/18/2014  . Hyperglycemia   . Acute ischemic stroke ([place]) 02/04/2014  . TIA (transient ischemic attack) 02/03/2014  . Hypoxia 02/02/2014  . Hypotension 02/02/2014  . Edema 12/20/2013  . Constipation 08/15/2013  . Anemia of chronic disease 08/10/2013  . Anemia, unspecified 07/05/2013  . Atrial fibrillation ([place]) 06/30/2013  . Hemiparesis as late effect of cerebrovascular accident (CVA) ([place]) 01/19/2011  . Long term current use of anticoagulant therapy 01/19/2011  .  GERD 08/19/2008  . Essential hypertension 09/21/2007    Allergies  Allergen Reactions  . Sulfonamide Derivatives Rash    REACTION: rash    Medications: Patient's Medications  New Prescriptions   No medications on file  Previous Medications   ACETAMINOPHEN (TYLENOL) 325 MG TABLET    Take 650 mg by mouth every 4 (four) hours as needed.    APIXABAN (ELIQUIS) 5 MG TABS TABLET    Take 1 tablet (5 mg total) by mouth 2 (two) times daily.   BISACODYL (DULCOLAX) 10 MG SUPPOSITORY    Place 10 mg rectally every 12 (twelve) hours as needed for moderate constipation.   CARBAMIDE PEROXIDE (DEBROX) 6.5 % OTIC SOLUTION    5 drops. 5 drops to each ear at bedtime on the 21st of each month   METOPROLOL TARTRATE (LOPRESSOR) 25 MG TABLET    Take 25 mg by mouth 2 (two) times daily.   POLYETHYL GLYCOL-PROPYL GLYCOL (SYSTANE) 0.4-0.3 % SOLN    Apply to eye. One drop both eye four times daily   WHITE PETROLATUM-MINERAL OIL (SYSTANE NIGHTTIME) OINT    Place 1 application into both eyes at bedtime.  Modified Medications   No medications on file  Discontinued Medications   No medications on file    Physical Exam: Filed Vitals:   12/02/15 1705  BP: 138/70  Pulse: 56  Temp: 97.9 F (36.6 C)  TempSrc: Tympanic  Resp: 18   There is no weight on file to calculate BMI.  Physical Exam  Constitutional: He is oriented to person, place, and time. He appears well-developed and well-nourished. No distress.  HENT:  Head: Normocephalic and atraumatic.  Right Ear: External ear normal.  Left Ear: External ear normal.  Nose: Nose normal.  Mouth/Throat: No oropharyngeal exudate.  Eyes: Conjunctivae are normal. Pupils are equal, round, and reactive to light. Right eye exhibits no discharge. No scleral icterus.  Left eye blindness. The right lower eyelid ectropion.   Neck: Normal range of motion. Neck supple. No JVD present. No tracheal deviation present. No thyromegaly present.  Cardiovascular: Normal rate and  normal heart sounds.  An irregular rhythm present.  Pulmonary/Chest: Effort normal and breath sounds normal. No respiratory distress. He has no wheezes. He has no rales. He exhibits no tenderness.  Abdominal: He exhibits no distension. There is no tenderness.  Musculoskeletal: Normal range of motion. He exhibits edema. He exhibits no tenderness.  The right sided weakness since previous CVA-muscle strength is still 5/5. The right wrist and hand contractures-able to make fist and cannot extend fully. No longer ambulates with walker and w/c for mobility Trace pedal edema R>L Left sided weakness from the recent CVA-mild-grip strength 5/5  Neurological: He is alert and oriented to person, place, and time. He displays abnormal reflex. No cranial nerve deficit. Coordination normal.  the left sided decreased coordination-cannot transfer self independently and dropped his utensil, grip strength is still 5/5. Hx of right sided weakness  with R hand contracture. Left facial weakness.   Skin: Skin is warm and dry. No rash noted. He is not diaphoretic. No erythema. No pallor.  he right cholecystostomy surgical scar. The medial left lower leg surgical scar from previous melanoma removal. Top of left head skin lesion 2x2x1cm rough surface, inflamed base.   Psychiatric: Thought content normal. His mood appears anxious. His affect is not angry and not inappropriate. His speech is not rapid and/or pressured, not delayed and not slurred. He is agitated. He is not aggressive, not hyperactive, not slowed and not withdrawn. Cognition and memory are impaired. He expresses impulsivity and inappropriate judgment. He does not exhibit a depressed mood. He exhibits abnormal recent memory. He exhibits normal remote memory.    Labs reviewed: Basic Metabolic Panel:  Recent Labs  09/30/15  NA 136*  K 5.0  BUN 21  CREATININE 1.1    Liver Function Tests:  Recent Labs  09/30/15  AST 14  ALT 9*  ALKPHOS 71     CBC:  Recent Labs  09/30/15  WBC 7.3  HGB 12.0*  HCT 37*  PLT 267    Lab Results  Component Value Date   TSH 2.34 09/13/2013   Lab Results  Component Value Date   HGBA1C 5.6 02/03/2014   Lab Results  Component Value Date   CHOL 129 02/03/2014   HDL 45 02/03/2014   LDLCALC 64 02/03/2014   LDLDIRECT 139.4 04/26/2007   TRIG 99 02/03/2014   CHOLHDL 2.9 02/03/2014    Significant Diagnostic Results since last visit: none  Patient Care Team: Estill Dooms, MD as PCP - General (Internal Medicine) Kathrynn Ducking, MD as Consulting Physician (Neurology) Fanny Skates, MD as Consulting Physician (General Surgery) Irene Shipper, MD as Consulting Physician (Gastroenterology) Thayer Headings, MD as Consulting Physician (Cardiology) Tanda Rockers, MD as Consulting Physician (Pulmonary Disease)  Assessment/Plan Problem List Items Addressed This Visit    Hemiparesis as late effect of cerebrovascular accident (CVA) [facility]) - Primary    Right sided weakness with right wrist/hand contracture. Grip strength is still 5/5. Ambulates with walker and w/c to go further. New onset of decreased the left sided coordination 01/2014. C/o worsened in his right sided weakness and stiffness in his left side. Continue anticoagulation therapy.       GERD    Stable. off Omeprazole 20mg -refusal.       Essential hypertension (Chronic)    Controlled, continue Metoprolol 25mg  bid      Edema (Chronic)    On and off BLE      Constipation    Stable, continue Biscolax 10mg  suppository q12h p      Atrial fibrillation (HCC) (Chronic)    Atrial fibrillation remained rate controlled. Patient was placed on Eliquis per neurology's recommendations. Continue Metoprolol 25mg  bid.           Family/ staff Communication: observe the posterior right lower chest wall wound.   Labs/tests ordered:  none  ManXie Martise Waddell NP [facility] [address], [place] [address] On Call:  [phone] & follow prompts after 5pm & weekends Office Phone:  [phone] Office Fax:  [phone]

## 2015-12-03 NOTE — Assessment & Plan Note (Signed)
On and off BLE

## 2015-12-03 NOTE — Assessment & Plan Note (Signed)
Right sided weakness with right wrist/hand contracture. Grip strength is still 5/5. Ambulates with walker and w/c to go further. New onset of decreased the left sided coordination 01/2014. C/o worsened in his right sided weakness and stiffness in his left side. Continue anticoagulation therapy.

## 2015-12-03 NOTE — Assessment & Plan Note (Signed)
Stable, continue Biscolax 10mg  suppository q12h p

## 2015-12-03 NOTE — Assessment & Plan Note (Signed)
Atrial fibrillation remained rate controlled. Patient was placed on Eliquis per neurology's recommendations. Continue Metoprolol 25mg bid.  

## 2015-12-03 NOTE — Assessment & Plan Note (Signed)
Controlled, continue Metoprolol 25mg bid 

## 2015-12-03 NOTE — Assessment & Plan Note (Signed)
Stable. off Omeprazole 20mg-refusal. 

## 2015-12-22 ENCOUNTER — Encounter: Payer: Self-pay | Admitting: Nurse Practitioner

## 2015-12-22 ENCOUNTER — Non-Acute Institutional Stay (SKILLED_NURSING_FACILITY): Payer: Medicare Other | Admitting: Nurse Practitioner

## 2015-12-22 DIAGNOSIS — I4891 Unspecified atrial fibrillation: Secondary | ICD-10-CM | POA: Diagnosis not present

## 2015-12-22 DIAGNOSIS — I10 Essential (primary) hypertension: Secondary | ICD-10-CM

## 2015-12-22 DIAGNOSIS — K5900 Constipation, unspecified: Secondary | ICD-10-CM

## 2015-12-22 DIAGNOSIS — M24531 Contracture, right wrist: Secondary | ICD-10-CM | POA: Diagnosis not present

## 2015-12-22 DIAGNOSIS — K219 Gastro-esophageal reflux disease without esophagitis: Secondary | ICD-10-CM | POA: Diagnosis not present

## 2015-12-22 DIAGNOSIS — I69359 Hemiplegia and hemiparesis following cerebral infarction affecting unspecified side: Secondary | ICD-10-CM | POA: Diagnosis not present

## 2015-12-22 DIAGNOSIS — R609 Edema, unspecified: Secondary | ICD-10-CM | POA: Diagnosis not present

## 2015-12-22 NOTE — Progress Notes (Signed)
Patient ID: [name], male   DOB: 12/11/1918, 82 y.o.   MRN: [id number]  Location:  SNF [facility] Provider:  Marlana Latus NP  Code Status:  DNR Goals of care: Advanced Directive information    Chief Complaint  Patient presents with  . Medical Management of Chronic Issues     HPI: Patient is a 82 y.o. male seen in the SNF at [facility] today for evaluation of Hx of late effect of CVAs with R+L sided weakness, heart rate controlled A-fib, normalized HTN.   Review of Systems  Constitutional: Negative for fever and chills.  HENT: Positive for hearing loss. Negative for congestion, ear discharge, ear pain and nosebleeds.   Eyes: Negative for pain, discharge and redness.  Respiratory: Negative for cough, shortness of breath and wheezing.   Cardiovascular: Positive for leg swelling. Negative for chest pain and palpitations.       Trace  Gastrointestinal: Negative for nausea, vomiting, abdominal pain and constipation.  Genitourinary: Positive for frequency. Negative for dysuria and urgency.  Musculoskeletal: Negative for myalgias, back pain and neck pain.  Skin: Negative for rash.       Dry scaly skin. Top of left head skin lesion 2x2x1cm rough surface  Neurological: Negative for dizziness, tremors, seizures, weakness and headaches.  Endo/Heme/Allergies: Negative for environmental allergies. Bruises/bleeds easily.  Psychiatric/Behavioral: Positive for memory loss. Negative for suicidal ideas and hallucinations. The patient is nervous/anxious.     Past Medical History  Diagnosis Date  . GERD (gastroesophageal reflux disease)   . Hypertension   . Hearing loss     wears hearing aid - right side  . Leg swelling   . Dysphagia   . Bruises easily     due to coumadin  . Weakness     difficulty walking  . Contact lens/glasses fitting   . Stroke [facility]) 1999, 02/02/14  . Cancer ([place])   . 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 Gait disturbance   . COPD (chronic obstructive pulmonary disease)  ([place])   . Peripheral vascular disease ([place])   . History of melanoma   . Diverticulosis   . Abnormality of gait 05/16/2013  . Choledocholithiasis with obstruction 06/28/13  . Embolic stroke ([place]) [id number]  . PAF (paroxysmal atrial fibrillation) ([place])   . Hyperlipidemia   . Bradycardia   . Protein-calorie malnutrition, severe ([place])   . Ventricular tachycardia (paroxysmal) ([place])   . Obstructive sleep apnea of adult     uses cpap, pt does not know setting  . Personal history of fall 05/29/2013  . Fracture of femoral neck, right, closed 3114  . Long term (current) use of anticoagulants     PAF and embolic CVA  . Anemia, unspecified 07/05/2013  . Xerophthalmia 07/05/2013  . 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 Actinic keratosis   . Seborrheic keratosis   . Aortic valve stenosis   . Cerebrovascular disease   . Elevated liver enzymes   . Diverticulosis   . Herpes zoster 02/08/14    Left facial nerve distribution  . Right hemiparesis ([place])   . Melanoma ([place]) 2009  . Hyperglycemia   . Xerophthalmia     Patient Active Problem List   Diagnosis Date Noted  . Weakness of right side of body 11/21/2015  . Contracture of joint of forearm 11/21/2015  . Infected sebaceous cyst of skin 09/05/2015  . Skin lesion of scalp 02/26/2015  . Rash and nonspecific skin eruption 11/18/2014  . Dry skin dermatitis 08/21/2014  .  Ectropion of left lower eyelid 07/03/2014  . Osteoarthritis of hand, left 07/03/2014  . Dysphagia, unspecified(787.20) 02/18/2014  . Hyperglycemia   . Acute ischemic stroke ([place]) 02/04/2014  . TIA (transient ischemic attack) 02/03/2014  . Hypoxia 02/02/2014  . Hypotension 02/02/2014  . Edema 12/20/2013  . Constipation 08/15/2013  . Anemia of chronic disease 08/10/2013  . Anemia, unspecified 07/05/2013  . Atrial fibrillation ([place]) 06/30/2013  . Hemiparesis as late effect of cerebrovascular accident (CVA) ([place]) 01/19/2011  . Long term current use of anticoagulant therapy 01/19/2011  .  GERD 08/19/2008  . Essential hypertension 09/21/2007    Allergies  Allergen Reactions  . Sulfonamide Derivatives Rash    REACTION: rash    Medications: Patient's Medications  New Prescriptions   No medications on file  Previous Medications   ACETAMINOPHEN (TYLENOL) 325 MG TABLET    Take 650 mg by mouth every 4 (four) hours as needed.    APIXABAN (ELIQUIS) 5 MG TABS TABLET    Take 1 tablet (5 mg total) by mouth 2 (two) times daily.   BISACODYL (DULCOLAX) 10 MG SUPPOSITORY    Place 10 mg rectally every 12 (twelve) hours as needed for moderate constipation.   CARBAMIDE PEROXIDE (DEBROX) 6.5 % OTIC SOLUTION    5 drops. 5 drops to each ear at bedtime on the 21st of each month   METOPROLOL TARTRATE (LOPRESSOR) 25 MG TABLET    Take 25 mg by mouth 2 (two) times daily.   POLYETHYL GLYCOL-PROPYL GLYCOL (SYSTANE) 0.4-0.3 % SOLN    Apply to eye. One drop both eye four times daily   WHITE PETROLATUM-MINERAL OIL (SYSTANE NIGHTTIME) OINT    Place 1 application into both eyes at bedtime.  Modified Medications   No medications on file  Discontinued Medications   No medications on file    Physical Exam: Filed Vitals:   12/22/15 1113  BP: 124/64  Pulse: 56  Temp: 97.6 F (36.4 C)  TempSrc: Tympanic  Resp: 16   There is no weight on file to calculate BMI.  Physical Exam  Constitutional: He is oriented to person, place, and time. He appears well-developed and well-nourished. No distress.  HENT:  Head: Normocephalic and atraumatic.  Right Ear: External ear normal.  Left Ear: External ear normal.  Nose: Nose normal.  Mouth/Throat: No oropharyngeal exudate.  Eyes: Conjunctivae are normal. Pupils are equal, round, and reactive to light. Right eye exhibits no discharge. No scleral icterus.  Left eye blindness. The right lower eyelid ectropion.   Neck: Normal range of motion. Neck supple. No JVD present. No tracheal deviation present. No thyromegaly present.  Cardiovascular: Normal rate and  normal heart sounds.  An irregular rhythm present.  Pulmonary/Chest: Effort normal and breath sounds normal. No respiratory distress. He has no wheezes. He has no rales. He exhibits no tenderness.  Abdominal: He exhibits no distension. There is no tenderness.  Musculoskeletal: Normal range of motion. He exhibits edema. He exhibits no tenderness.  The right sided weakness since previous CVA-muscle strength is still 5/5. The right wrist and hand contractures-able to make fist and cannot extend fully. No longer ambulates with walker and w/c for mobility Trace pedal edema R>L Left sided weakness from the recent CVA-mild-grip strength 5/5  Neurological: He is alert and oriented to person, place, and time. He displays abnormal reflex. No cranial nerve deficit. Coordination normal.  the left sided decreased coordination-cannot transfer self independently and dropped his utensil, grip strength is still 5/5. Hx of right sided weakness  with R hand contracture. Left facial weakness.   Skin: Skin is warm and dry. No rash noted. He is not diaphoretic. No erythema. No pallor.  he right cholecystostomy surgical scar. The medial left lower leg surgical scar from previous melanoma removal. Top of left head skin lesion 2x2x1cm rough surface, inflamed base.   Psychiatric: Thought content normal. His mood appears anxious. His affect is not angry and not inappropriate. His speech is not rapid and/or pressured, not delayed and not slurred. He is agitated. He is not aggressive, not hyperactive, not slowed and not withdrawn. Cognition and memory are impaired. He expresses impulsivity and inappropriate judgment. He does not exhibit a depressed mood. He exhibits abnormal recent memory. He exhibits normal remote memory.    Labs reviewed: Basic Metabolic Panel:  Recent Labs  09/30/15  NA 136*  K 5.0  BUN 21  CREATININE 1.1    Liver Function Tests:  Recent Labs  09/30/15  AST 14  ALT 9*  ALKPHOS 71     CBC:  Recent Labs  09/30/15  WBC 7.3  HGB 12.0*  HCT 37*  PLT 267    Lab Results  Component Value Date   TSH 2.34 09/13/2013   Lab Results  Component Value Date   HGBA1C 5.6 02/03/2014   Lab Results  Component Value Date   CHOL 129 02/03/2014   HDL 45 02/03/2014   LDLCALC 64 02/03/2014   LDLDIRECT 139.4 04/26/2007   TRIG 99 02/03/2014   CHOLHDL 2.9 02/03/2014    Significant Diagnostic Results since last visit: none  Patient Care Team: Estill Dooms, MD as PCP - General (Internal Medicine) Kathrynn Ducking, MD as Consulting Physician (Neurology) Fanny Skates, MD as Consulting Physician (General Surgery) Irene Shipper, MD as Consulting Physician (Gastroenterology) Thayer Headings, MD as Consulting Physician (Cardiology) Tanda Rockers, MD as Consulting Physician (Pulmonary Disease)  Assessment/Plan Problem List Items Addressed This Visit    Hemiparesis as late effect of cerebrovascular accident (CVA) [facility]) - Primary    Right sided weakness with right wrist/hand contracture. Grip strength is still 5/5. Ambulates with walker and w/c to go further. New onset of decreased the left sided coordination 01/2014. C/o worsened in his right sided weakness and stiffness in his left side. Continue anticoagulation therapy      GERD    Stable. off Omeprazole 20mg -refusal.      Essential hypertension (Chronic)    Controlled, continue Metoprolol 25mg  bid      Edema (Chronic)    On and off BLE      Contracture of joint of forearm    Right elbow and wrist      Constipation    Stable, continue Biscolax 10mg  suppository q12h prn      Atrial fibrillation (HCC) (Chronic)    Atrial fibrillation remained rate controlled. Patient was placed on Eliquis per neurology's recommendations. Continue Metoprolol 25mg  bid          Family/ staff Communication: continue SNF for care needs  Labs/tests ordered:  none  [facility] Mast NP [facility] [address], [place] [address] On Call:  [phone] & follow prompts after 5pm & weekends Office Phone:  [phone] Office Fax:  [phone]

## 2015-12-22 NOTE — Assessment & Plan Note (Signed)
On and off BLE

## 2015-12-22 NOTE — Assessment & Plan Note (Signed)
Stable, continue Biscolax 10mg suppository q12h prn.  

## 2015-12-22 NOTE — Assessment & Plan Note (Signed)
Right sided weakness with right wrist/hand contracture. Grip strength is still 5/5. Ambulates with walker and w/c to go further. New onset of decreased the left sided coordination 01/2014. C/o worsened in his right sided weakness and stiffness in his left side. Continue anticoagulation therapy

## 2015-12-22 NOTE — Assessment & Plan Note (Signed)
Controlled, continue Metoprolol 25mg bid 

## 2015-12-22 NOTE — Assessment & Plan Note (Signed)
Right elbow and wrist

## 2015-12-22 NOTE — Assessment & Plan Note (Signed)
Stable. off Omeprazole 20mg-refusal. 

## 2015-12-22 NOTE — Assessment & Plan Note (Signed)
Atrial fibrillation remained rate controlled. Patient was placed on Eliquis per neurology's recommendations. Continue Metoprolol 25mg bid.  

## 2016-01-14 ENCOUNTER — Encounter: Payer: Self-pay | Admitting: Nurse Practitioner

## 2016-01-14 ENCOUNTER — Non-Acute Institutional Stay (SKILLED_NURSING_FACILITY): Payer: Medicare Other | Admitting: Nurse Practitioner

## 2016-01-14 DIAGNOSIS — K219 Gastro-esophageal reflux disease without esophagitis: Secondary | ICD-10-CM | POA: Diagnosis not present

## 2016-01-14 DIAGNOSIS — I10 Essential (primary) hypertension: Secondary | ICD-10-CM

## 2016-01-14 DIAGNOSIS — Z7901 Long term (current) use of anticoagulants: Secondary | ICD-10-CM | POA: Diagnosis not present

## 2016-01-14 DIAGNOSIS — I482 Chronic atrial fibrillation: Secondary | ICD-10-CM

## 2016-01-14 DIAGNOSIS — M24531 Contracture, right wrist: Secondary | ICD-10-CM | POA: Diagnosis not present

## 2016-01-14 DIAGNOSIS — R609 Edema, unspecified: Secondary | ICD-10-CM | POA: Diagnosis not present

## 2016-01-14 DIAGNOSIS — K5900 Constipation, unspecified: Secondary | ICD-10-CM

## 2016-01-14 DIAGNOSIS — I69359 Hemiplegia and hemiparesis following cerebral infarction affecting unspecified side: Secondary | ICD-10-CM | POA: Diagnosis not present

## 2016-01-14 DIAGNOSIS — I4821 Permanent atrial fibrillation: Secondary | ICD-10-CM

## 2016-01-19 NOTE — Assessment & Plan Note (Signed)
Multiple sites, main in the right elbow and wrist

## 2016-01-19 NOTE — Progress Notes (Signed)
Patient ID: [name], male   DOB: 1919-08-30, 82 y.o.   MRN: [id number]  Location:  SNF [facility] Provider:  Marlana Latus NP  Code Status:  DNR Goals of care: Advanced Directive information Does patient have an advance directive?: Yes, Type of Advance Directive: Living will;Out of facility DNR (pink MOST or yellow form)  Chief Complaint  Patient presents with  . Medical Management of Chronic Issues    Routine Visit     HPI: Patient is a 82 y.o. male seen in the SNF at [facility] today for evaluation of Hx of late effect of CVAs with R+L sided weakness, heart rate controlled A-fib, normalized HTN.   Review of Systems  Constitutional: Negative for fever and chills.  HENT: Positive for hearing loss. Negative for congestion, ear discharge, ear pain and nosebleeds.   Eyes: Negative for pain, discharge and redness.  Respiratory: Negative for cough, shortness of breath and wheezing.   Cardiovascular: Positive for leg swelling. Negative for chest pain and palpitations.       Trace  Gastrointestinal: Negative for nausea, vomiting, abdominal pain and constipation.  Genitourinary: Positive for frequency. Negative for dysuria and urgency.  Musculoskeletal: Negative for myalgias, back pain and neck pain.  Skin: Negative for rash.       Dry scaly skin. Top of left head skin lesion 2x2x1cm rough surface  Neurological: Negative for dizziness, tremors, seizures, weakness and headaches.  Endo/Heme/Allergies: Negative for environmental allergies. Bruises/bleeds easily.  Psychiatric/Behavioral: Positive for memory loss. Negative for suicidal ideas and hallucinations. The patient is nervous/anxious.     Past Medical History  Diagnosis Date  . GERD (gastroesophageal reflux disease)   . Hypertension   . Hearing loss     wears hearing aid - right side  . Leg swelling   . Dysphagia   . Bruises easily     due to coumadin  . Weakness     difficulty walking  . Contact lens/glasses fitting    . Stroke [facility]) 1999, 02/02/14  . Cancer ([place])   . 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 Gait disturbance   . COPD (chronic obstructive pulmonary disease) ([place])   . Peripheral vascular disease ([place])   . History of melanoma   . Diverticulosis   . Abnormality of gait 05/16/2013  . Choledocholithiasis with obstruction 06/28/13  . Embolic stroke ([place]) [id number]  . PAF (paroxysmal atrial fibrillation) ([place])   . Hyperlipidemia   . Bradycardia   . Protein-calorie malnutrition, severe ([place])   . Ventricular tachycardia (paroxysmal) ([place])   . Obstructive sleep apnea of adult     uses cpap, pt does not know setting  . Personal history of fall 05/29/2013  . Fracture of femoral neck, right, closed 3114  . Long term (current) use of anticoagulants     PAF and embolic CVA  . Anemia, unspecified 07/05/2013  . Xerophthalmia 07/05/2013  . 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 Actinic keratosis   . Seborrheic keratosis   . Aortic valve stenosis   . Cerebrovascular disease   . Elevated liver enzymes   . Diverticulosis   . Herpes zoster 02/08/14    Left facial nerve distribution  . Right hemiparesis ([place])   . Melanoma ([place]) 2009  . Hyperglycemia   . Xerophthalmia     Patient Active Problem List   Diagnosis Date Noted  . Weakness of right side of body 11/21/2015  . Contracture of joint of forearm 11/21/2015  . Infected sebaceous cyst of skin  09/05/2015  . Skin lesion of scalp 02/26/2015  . Rash and nonspecific skin eruption 11/18/2014  . Dry skin dermatitis 08/21/2014  . Ectropion of left lower eyelid 07/03/2014  . Osteoarthritis of hand, left 07/03/2014  . Dysphagia, unspecified(787.20) 02/18/2014  . Hyperglycemia   . Acute ischemic stroke ([place]) 02/04/2014  . TIA (transient ischemic attack) 02/03/2014  . Hypoxia 02/02/2014  . Hypotension 02/02/2014  . Edema 12/20/2013  . Constipation 08/15/2013  . Anemia of chronic disease 08/10/2013  . Anemia, unspecified 07/05/2013  . Atrial fibrillation ([place]) 06/30/2013    . Hemiparesis as late effect of cerebrovascular accident (CVA) ([place]) 01/19/2011  . Long term current use of anticoagulant therapy 01/19/2011  . GERD 08/19/2008  . Essential hypertension 09/21/2007    Allergies  Allergen Reactions  . Sulfonamide Derivatives Rash    REACTION: rash    Medications: Patient's Medications  New Prescriptions   No medications on file  Previous Medications   ACETAMINOPHEN (TYLENOL) 325 MG TABLET    Take 650 mg by mouth every 4 (four) hours as needed.    APIXABAN (ELIQUIS) 5 MG TABS TABLET    Take 1 tablet (5 mg total) by mouth 2 (two) times daily.   BISACODYL (DULCOLAX) 10 MG SUPPOSITORY    Place 10 mg rectally every 12 (twelve) hours as needed for moderate constipation.   CARBAMIDE PEROXIDE (DEBROX) 6.5 % OTIC SOLUTION    5 drops. 5 drops to each ear at bedtime on the 21st of each month   METOPROLOL TARTRATE (LOPRESSOR) 25 MG TABLET    Take 25 mg by mouth 2 (two) times daily.   POLYETHYL GLYCOL-PROPYL GLYCOL (SYSTANE) 0.4-0.3 % SOLN    Apply to eye. One drop both eye four times daily   WHITE PETROLATUM-MINERAL OIL (SYSTANE NIGHTTIME) OINT    Place 1 application into both eyes at bedtime.  Modified Medications   No medications on file  Discontinued Medications   No medications on file    Physical Exam: Filed Vitals:   01/14/16 0951  BP: 110/72  Pulse: 64  Temp: 97.8 F (36.6 C)  TempSrc: Oral  Resp: 16  Height: 5\' 3"  (1.6 m)  Weight: 163 lb 8 oz (74.163 kg)   Body mass index is 28.97 kg/(m^2).  Physical Exam  Constitutional: He is oriented to person, place, and time. He appears well-developed and well-nourished. No distress.  HENT:  Head: Normocephalic and atraumatic.  Right Ear: External ear normal.  Left Ear: External ear normal.  Nose: Nose normal.  Mouth/Throat: No oropharyngeal exudate.  Eyes: Conjunctivae are normal. Pupils are equal, round, and reactive to light. Right eye exhibits no discharge. No scleral icterus.  Left eye  blindness. The right lower eyelid ectropion.   Neck: Normal range of motion. Neck supple. No JVD present. No tracheal deviation present. No thyromegaly present.  Cardiovascular: Normal rate and normal heart sounds.  An irregular rhythm present.  Pulmonary/Chest: Effort normal and breath sounds normal. No respiratory distress. He has no wheezes. He has no rales. He exhibits no tenderness.  Abdominal: He exhibits no distension. There is no tenderness.  Musculoskeletal: Normal range of motion. He exhibits edema. He exhibits no tenderness.  The right sided weakness since previous CVA-muscle strength is still 5/5. The right wrist and hand contractures-able to make fist and cannot extend fully. No longer ambulates with walker and w/c for mobility Trace pedal edema R>L Left sided weakness from the recent CVA-mild-grip strength 5/5  Neurological: He is alert and oriented to person,  place, and time. He displays abnormal reflex. No cranial nerve deficit. Coordination normal.  the left sided decreased coordination-cannot transfer self independently and dropped his utensil, grip strength is still 5/5. Hx of right sided weakness with R hand contracture. Left facial weakness.   Skin: Skin is warm and dry. No rash noted. He is not diaphoretic. No erythema. No pallor.  he right cholecystostomy surgical scar. The medial left lower leg surgical scar from previous melanoma removal. Top of left head skin lesion 2x2x1cm rough surface, inflamed base.   Psychiatric: Thought content normal. His mood appears anxious. His affect is not angry and not inappropriate. His speech is not rapid and/or pressured, not delayed and not slurred. He is agitated. He is not aggressive, not hyperactive, not slowed and not withdrawn. Cognition and memory are impaired. He expresses impulsivity and inappropriate judgment. He does not exhibit a depressed mood. He exhibits abnormal recent memory. He exhibits normal remote memory.    Labs  reviewed: Basic Metabolic Panel:  Recent Labs  09/30/15  NA 136*  K 5.0  BUN 21  CREATININE 1.1    Liver Function Tests:  Recent Labs  09/30/15  AST 14  ALT 9*  ALKPHOS 71    CBC:  Recent Labs  09/30/15  WBC 7.3  HGB 12.0*  HCT 37*  PLT 267    Lab Results  Component Value Date   TSH 2.34 09/13/2013   Lab Results  Component Value Date   HGBA1C 5.6 02/03/2014   Lab Results  Component Value Date   CHOL 129 02/03/2014   HDL 45 02/03/2014   LDLCALC 64 02/03/2014   LDLDIRECT 139.4 04/26/2007   TRIG 99 02/03/2014   CHOLHDL 2.9 02/03/2014    Significant Diagnostic Results since last visit: none  Patient Care Team: Estill Dooms, MD as PCP - General (Internal Medicine) Kathrynn Ducking, MD as Consulting Physician (Neurology) Fanny Skates, MD as Consulting Physician (General Surgery) Irene Shipper, MD as Consulting Physician (Gastroenterology) Thayer Headings, MD as Consulting Physician (Cardiology) Tanda Rockers, MD as Consulting Physician (Pulmonary Disease)  Assessment/Plan Problem List Items Addressed This Visit    Essential hypertension - Primary (Chronic)    Controlled, continue Metoprolol 25mg  bid      Long term current use of anticoagulant therapy (Chronic)    Eliquis since the past CVA 01/2014      Atrial fibrillation (HCC) (Chronic)    Atrial fibrillation remained rate controlled. Patient was placed on Eliquis per neurology's recommendations. Continue Metoprolol 25mg  bid      Edema (Chronic)    On and off BLE      GERD    Stable. off Omeprazole 20mg -refusal.      Hemiparesis as late effect of cerebrovascular accident (CVA) ([place])    Right sided weakness with right wrist/hand contracture. Grip strength is still 5/5. Ambulates with walker and w/c to go further. New onset of decreased the left sided coordination 01/2014. C/o worsened in his right sided weakness and stiffness in his left side. Continue anticoagulation therapy        Constipation    Stable, continue Biscolax 10mg  suppository q12h prn      Contracture of joint of forearm    Multiple sites, main in the right elbow and wrist          Family/ staff Communication: continue SNF for care needs  Labs/tests ordered:  none  [facility] Mast NP Geriatrics [facility] [address]  [place], [place] [address] On Call:  [phone] & follow prompts after 5pm & weekends Office Phone:  [phone] Office Fax:  [phone]

## 2016-01-19 NOTE — Assessment & Plan Note (Signed)
Controlled, continue Metoprolol 25mg bid 

## 2016-01-19 NOTE — Assessment & Plan Note (Signed)
Stable. off Omeprazole 20mg-refusal. 

## 2016-01-19 NOTE — Assessment & Plan Note (Signed)
Atrial fibrillation remained rate controlled. Patient was placed on Eliquis per neurology's recommendations. Continue Metoprolol 25mg bid.  

## 2016-01-19 NOTE — Assessment & Plan Note (Signed)
On and off BLE

## 2016-01-19 NOTE — Assessment & Plan Note (Signed)
Eliquis since the past CVA 01/2014  

## 2016-01-19 NOTE — Assessment & Plan Note (Signed)
Stable, continue Biscolax 10mg suppository q12h prn.  

## 2016-01-19 NOTE — Assessment & Plan Note (Signed)
Right sided weakness with right wrist/hand contracture. Grip strength is still 5/5. Ambulates with walker and w/c to go further. New onset of decreased the left sided coordination 01/2014. C/o worsened in his right sided weakness and stiffness in his left side. Continue anticoagulation therapy

## 2016-02-11 ENCOUNTER — Non-Acute Institutional Stay (SKILLED_NURSING_FACILITY): Payer: Medicare Other | Admitting: Nurse Practitioner

## 2016-02-11 ENCOUNTER — Encounter: Payer: Self-pay | Admitting: Nurse Practitioner

## 2016-02-11 DIAGNOSIS — K219 Gastro-esophageal reflux disease without esophagitis: Secondary | ICD-10-CM | POA: Diagnosis not present

## 2016-02-11 DIAGNOSIS — I10 Essential (primary) hypertension: Secondary | ICD-10-CM

## 2016-02-11 DIAGNOSIS — I480 Paroxysmal atrial fibrillation: Secondary | ICD-10-CM

## 2016-02-11 DIAGNOSIS — M24531 Contracture, right wrist: Secondary | ICD-10-CM

## 2016-02-11 DIAGNOSIS — R609 Edema, unspecified: Secondary | ICD-10-CM

## 2016-02-11 DIAGNOSIS — I69359 Hemiplegia and hemiparesis following cerebral infarction affecting unspecified side: Secondary | ICD-10-CM | POA: Diagnosis not present

## 2016-02-11 DIAGNOSIS — Z7901 Long term (current) use of anticoagulants: Secondary | ICD-10-CM

## 2016-02-11 DIAGNOSIS — K5900 Constipation, unspecified: Secondary | ICD-10-CM | POA: Diagnosis not present

## 2016-02-11 NOTE — Assessment & Plan Note (Signed)
Right sided weakness with right wrist/hand contracture. Grip strength is still 5/5. Ambulates with walker and w/c to go further. New onset of decreased the left sided coordination 01/2014. C/o worsened in his right sided weakness and stiffness in his left side. Continue anticoagulation therapy

## 2016-02-11 NOTE — Assessment & Plan Note (Signed)
Stable. off Omeprazole 20mg-refusal. 

## 2016-02-11 NOTE — Progress Notes (Signed)
Patient ID: [name], male   DOB: September 28, 1919, 82 y.o.   MRN: [id number]  Location:  [place] Room Number: 86 Place of Service:  SNF (31) Provider: Lennie Odor Mast NP  GREEN, Viviann Spare, MD  Patient Care Team: Estill Dooms, MD as PCP - General (Internal Medicine) Kathrynn Ducking, MD as Consulting Physician (Neurology) Fanny Skates, MD as Consulting Physician (General Surgery) Irene Shipper, MD as Consulting Physician (Gastroenterology) Thayer Headings, MD as Consulting Physician (Cardiology) Tanda Rockers, MD as Consulting Physician (Pulmonary Disease)  Extended Emergency Contact Information Primary Emergency Contact: Stansbury,Williams (Rod) Address: [address]          [place], [place] [address] [place] Phone: [phone] Relation: Son  Code Status:  DNR Goals of care: Advanced Directive information Advanced Directives 02/11/2016  Does patient have an advance directive? Yes  Type of Advance Directive Living will;Out of facility DNR (pink MOST or yellow form)  Does patient want to make changes to advanced directive? No - Patient declined  Copy of advanced directive(s) in chart? Yes     Chief Complaint  Patient presents with  . Medical Management of Chronic Issues    Routine Visit    HPI:  Pt is a 82 y.o. male seen today for medical management of chronic diseases.  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      Past Medical History  Diagnosis Date  . GERD (gastroesophageal reflux disease)   . Hypertension   . Hearing loss     wears hearing aid - right side  . Leg swelling   . Dysphagia   . Bruises easily     due to coumadin  . Weakness     difficulty walking  . Contact lens/glasses fitting   . Stroke [facility]) 1999, 02/02/14  . Cancer ([place])   . 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 Gait disturbance     . COPD (chronic obstructive pulmonary disease) ([place])   . Peripheral vascular disease ([place])   . History of melanoma   . Diverticulosis   . Abnormality of gait 05/16/2013  . Choledocholithiasis with obstruction 06/28/13  . Embolic stroke ([place]) [id number]  . PAF (paroxysmal atrial fibrillation) ([place])   . Hyperlipidemia   . Bradycardia   . Protein-calorie malnutrition, severe ([place])   . Ventricular tachycardia (paroxysmal) ([place])   . Obstructive sleep apnea of adult     uses cpap, pt does not know setting  . Personal history of fall 05/29/2013  . Fracture of femoral neck, right, closed 3114  . Long term (current) use of anticoagulants     PAF and embolic CVA  . Anemia, unspecified 07/05/2013  . Xerophthalmia 07/05/2013  . 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 Actinic keratosis   . Seborrheic keratosis   . Aortic valve stenosis   . Cerebrovascular disease   . Elevated liver enzymes   . Diverticulosis   . Herpes zoster 02/08/14    Left facial nerve distribution  . Right hemiparesis ([place])   . Melanoma ([place]) 2009  . Hyperglycemia   . Xerophthalmia    Past Surgical History  Procedure Laterality Date  . Excision of melanoma  2009    left leg  . Melanoma excision      many melanoma removed in past  . Melanoma excision  10/11/2011    Procedure:  MELANOMA EXCISION;  Surgeon: Pedro Earls, MD;  Location: [facility];  Service: General;  Laterality: Left;  EXCISION melanoma left leg with full thickness skin grafting from left lower abdomen.  . Joint replacement  2012    r fem head fx  . Lung benign area removed   1970's  . Hernia repair  1983  . Melanoma excision  05/16/2012    Procedure: MELANOMA EXCISION;  Surgeon: Pedro Earls, MD;  Location: [facility];  Service: General;  Laterality: Left;  Nodule Removal of Melanoma on Left Shin  . Ercp N/A 07/04/2013    Procedure: ENDOSCOPIC RETROGRADE CHOLANGIOPANCREATOGRAPHY (ERCP);  Surgeon: Irene Shipper, MD;  Location: [facility];  Service: Endoscopy;   Laterality: N/A;    Allergies  Allergen Reactions  . Sulfonamide Derivatives Rash    REACTION: rash      Medication List       This list is accurate as of: 02/11/16 11:59 PM.  Always use your most recent med list.               acetaminophen 325 MG tablet  Commonly known as:  TYLENOL  Take 650 mg by mouth every 4 (four) hours as needed.     apixaban 5 MG Tabs tablet  Commonly known as:  ELIQUIS  Take 1 tablet (5 mg total) by mouth 2 (two) times daily.     bisacodyl 10 MG suppository  Commonly known as:  DULCOLAX  Place 10 mg rectally every 12 (twelve) hours as needed for moderate constipation.     carbamide peroxide 6.5 % otic solution  Commonly known as:  DEBROX  5 drops. 5 drops to each ear at bedtime on the 21st of each month     metoprolol tartrate 25 MG tablet  Commonly known as:  LOPRESSOR  Take 25 mg by mouth 2 (two) times daily.     SYSTANE 0.4-0.3 % Soln  Generic drug:  Polyethyl Glycol-Propyl Glycol  Apply to eye. One drop both eye four times daily     SYSTANE NIGHTTIME Oint  Place 1 application into both eyes at bedtime.        Review of Systems  Constitutional: Negative for fever and chills.  HENT: Positive for hearing loss. Negative for congestion, ear discharge, ear pain and nosebleeds.   Eyes: Negative for pain, discharge and redness.  Respiratory: Negative for cough, shortness of breath and wheezing.   Cardiovascular: Positive for leg swelling. Negative for chest pain and palpitations.       Trace  Gastrointestinal: Negative for nausea, vomiting, abdominal pain and constipation.  Genitourinary: Positive for frequency. Negative for dysuria and urgency.  Musculoskeletal: Negative for myalgias, back pain and neck pain.  Skin: Negative for rash.       Dry scaly skin. Top of left head skin lesion 2x2x1cm rough surface  Allergic/Immunologic: Negative for environmental allergies.  Neurological: Negative for dizziness, tremors, seizures, weakness  and headaches.  Hematological: Bruises/bleeds easily.  Psychiatric/Behavioral: Negative for suicidal ideas and hallucinations. The patient is nervous/anxious.     Immunization History  Administered Date(s) Administered  . Influenza Whole 10/06/2010, 08/07/2011, 09/20/2012  . Influenza-Unspecified 09/05/2013, 10/09/2014, 08/26/2015  . PPD Test 08/13/2013  . Pneumococcal Polysaccharide-23 05/06/1997  . Td 03/30/2005  . Tdap 05/29/2013   Pertinent  Health Maintenance Due  Topic Date Due  . PNA vac Low Risk Adult (2 of 2 - PCV13) 05/06/1998  . INFLUENZA VACCINE  07/06/2016   No flowsheet data found. Functional Status  Survey:    Filed Vitals:   02/11/16 0900  BP: 130/80  Pulse: 56  Temp: 96.8 F (36 C)  TempSrc: Oral  Resp: 16  Height: 5\' 3"  (1.6 m)  Weight: 161 lb 12.8 oz (73.392 kg)   Body mass index is 28.67 kg/(m^2). Physical Exam  Constitutional: He is oriented to person, place, and time. He appears well-developed and well-nourished. No distress.  HENT:  Head: Normocephalic and atraumatic.  Right Ear: External ear normal.  Left Ear: External ear normal.  Nose: Nose normal.  Mouth/Throat: No oropharyngeal exudate.  Eyes: Conjunctivae are normal. Pupils are equal, round, and reactive to light. Right eye exhibits no discharge. No scleral icterus.  Left eye blindness. The right lower eyelid ectropion.   Neck: Normal range of motion. Neck supple. No JVD present. No tracheal deviation present. No thyromegaly present.  Cardiovascular: Normal rate and normal heart sounds.  An irregular rhythm present.  Pulmonary/Chest: Effort normal and breath sounds normal. No respiratory distress. He has no wheezes. He has no rales. He exhibits no tenderness.  Abdominal: He exhibits no distension. There is no tenderness.  Musculoskeletal: Normal range of motion. He exhibits edema. He exhibits no tenderness.  The right sided weakness since previous CVA-muscle strength is still 5/5. The  right wrist and hand contractures-able to make fist and cannot extend fully. No longer ambulates with walker and w/c for mobility Trace pedal edema R>L Left sided weakness from the recent CVA-mild-grip strength 5/5  Neurological: He is alert and oriented to person, place, and time. He displays abnormal reflex. No cranial nerve deficit. Coordination normal.  the left sided decreased coordination-cannot transfer self independently and dropped his utensil, grip strength is still 5/5. Hx of right sided weakness with R hand contracture. Left facial weakness.   Skin: Skin is warm and dry. No rash noted. He is not diaphoretic. No erythema. No pallor.  he right cholecystostomy surgical scar. The medial left lower leg surgical scar from previous melanoma removal. Top of left head skin lesion 2x2x1cm rough surface, inflamed base.   Psychiatric: Thought content normal. His mood appears anxious. His affect is not angry and not inappropriate. His speech is not rapid and/or pressured, not delayed and not slurred. He is agitated. He is not aggressive, not hyperactive, not slowed and not withdrawn. Cognition and memory are impaired. He expresses impulsivity and inappropriate judgment. He does not exhibit a depressed mood. He exhibits abnormal recent memory. He exhibits normal remote memory.    Labs reviewed:  Recent Labs  09/30/15  NA 136*  K 5.0  BUN 21  CREATININE 1.1    Recent Labs  09/30/15  AST 14  ALT 9*  ALKPHOS 71    Recent Labs  09/30/15  WBC 7.3  HGB 12.0*  HCT 37*  PLT 267   Lab Results  Component Value Date   TSH 2.34 09/13/2013   Lab Results  Component Value Date   HGBA1C 5.6 02/03/2014   Lab Results  Component Value Date   CHOL 129 02/03/2014   HDL 45 02/03/2014   LDLCALC 64 02/03/2014   LDLDIRECT 139.4 04/26/2007   TRIG 99 02/03/2014   CHOLHDL 2.9 02/03/2014    Significant Diagnostic Results in last 30 days:  No results found.  Assessment/Plan  Atrial  fibrillation (HCC) Atrial fibrillation remained rate controlled. Patient was placed on Eliquis 5mg  bid per neurology's recommendations. Continue Metoprolol 25mg  bid  Constipation Stable, continue Biscolax 10mg  suppository q12h prn, MiraLax daily.   Contracture of  joint of forearm Multiple sites, main in the right elbow and wrist, prn Tylenol needed for aches and pains  Edema On and off BLE  Essential hypertension Controlled, continue Metoprolol 25mg  bid  GERD Stable. off Omeprazole 20mg -refusal.  Hemiparesis as late effect of cerebrovascular accident (CVA) ([place]) Right sided weakness with right wrist/hand contracture. Grip strength is still 5/5. Ambulates with walker and w/c to go further. New onset of decreased the left sided coordination 01/2014. C/o worsened in his right sided weakness and stiffness in his left side. Continue anticoagulation therapy  Long term current use of anticoagulant therapy Eliquis since the past CVA 01/2014    Family/ staff Communication: continue SNF for care needs  Labs/tests ordered: none

## 2016-02-11 NOTE — Assessment & Plan Note (Signed)
Controlled, continue Metoprolol 25mg bid 

## 2016-02-11 NOTE — Assessment & Plan Note (Signed)
On and off BLE

## 2016-02-11 NOTE — Assessment & Plan Note (Signed)
Atrial fibrillation remained rate controlled. Patient was placed on Eliquis 5mg bid per neurology's recommendations. Continue Metoprolol 25mg bid 

## 2016-02-11 NOTE — Assessment & Plan Note (Signed)
Stable, continue Biscolax 10mg suppository q12h prn, MiraLax daily.     

## 2016-02-11 NOTE — Assessment & Plan Note (Signed)
Eliquis since the past CVA 01/2014  

## 2016-02-11 NOTE — Assessment & Plan Note (Signed)
Multiple sites, main in the right elbow and wrist, prn Tylenol needed for aches and pains

## 2016-03-12 ENCOUNTER — Non-Acute Institutional Stay (SKILLED_NURSING_FACILITY): Payer: Medicare Other | Admitting: Nurse Practitioner

## 2016-03-12 ENCOUNTER — Encounter: Payer: Self-pay | Admitting: Nurse Practitioner

## 2016-03-12 DIAGNOSIS — K5900 Constipation, unspecified: Secondary | ICD-10-CM | POA: Diagnosis not present

## 2016-03-12 DIAGNOSIS — I69359 Hemiplegia and hemiparesis following cerebral infarction affecting unspecified side: Secondary | ICD-10-CM

## 2016-03-12 DIAGNOSIS — R609 Edema, unspecified: Secondary | ICD-10-CM

## 2016-03-12 DIAGNOSIS — I10 Essential (primary) hypertension: Secondary | ICD-10-CM | POA: Diagnosis not present

## 2016-03-12 DIAGNOSIS — K219 Gastro-esophageal reflux disease without esophagitis: Secondary | ICD-10-CM

## 2016-03-12 DIAGNOSIS — M24539 Contracture, unspecified wrist: Secondary | ICD-10-CM

## 2016-03-12 DIAGNOSIS — Z7901 Long term (current) use of anticoagulants: Secondary | ICD-10-CM

## 2016-03-12 DIAGNOSIS — I480 Paroxysmal atrial fibrillation: Secondary | ICD-10-CM

## 2016-03-12 NOTE — Assessment & Plan Note (Signed)
Right sided weakness with right wrist/hand contracture. Grip strength is still 5/5. Ambulates with walker and w/c to go further. New onset of decreased the left sided coordination 01/2014. C/o worsened in his right sided weakness and stiffness in his left side. Continue anticoagulation therapy

## 2016-03-12 NOTE — Progress Notes (Signed)
Patient ID: [name], male   DOB: [birth year], 82 y.o.   MRN: [id number]  Location:  [place] Room Number: 75 Place of Service:  SNF (31) Provider: Lennie Odor Nakari Bracknell NP  GREEN, Viviann Spare, MD  Patient Care Team: Estill Dooms, MD as PCP - General (Internal Medicine) Kathrynn Ducking, MD as Consulting Physician (Neurology) Fanny Skates, MD as Consulting Physician (General Surgery) Irene Shipper, MD as Consulting Physician (Gastroenterology) Thayer Headings, MD as Consulting Physician (Cardiology) Tanda Rockers, MD as Consulting Physician (Pulmonary Disease)  Extended Emergency Contact Information Primary Emergency Contact: Routh,Williams (Rod) Address: [address]          [place], [place] [address] [place] Phone: [phone] Relation: Son  Code Status:  DNR Goals of care: Advanced Directive information Advanced Directives 03/12/2016  Does patient have an advance directive? Yes  Type of Advance Directive Living will;Out of facility DNR (pink MOST or yellow form)  Does patient want to make changes to advanced directive? No - Patient declined  Copy of advanced directive(s) in chart? Yes     Chief Complaint  Patient presents with  . Medical Management of Chronic Issues    routine Visit    HPI:  Pt is a 82 y.o. male seen today for medical management of chronic diseases.  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      Past Medical History  Diagnosis Date  . GERD (gastroesophageal reflux disease)   . Hypertension   . Hearing loss     wears hearing aid - right side  . Leg swelling   . Dysphagia   . Bruises easily     due to coumadin  . Weakness     difficulty walking  . Contact lens/glasses fitting   . Stroke [facility]) 1999, 02/02/14  . Cancer ([place])   . 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 Gait disturbance     . COPD (chronic obstructive pulmonary disease) ([place])   . Peripheral vascular disease ([place])   . History of melanoma   . Diverticulosis   . Abnormality of gait 05/16/2013  . Choledocholithiasis with obstruction 06/28/13  . Embolic stroke ([place]) [id number]  . PAF (paroxysmal atrial fibrillation) ([place])   . Hyperlipidemia   . Bradycardia   . Protein-calorie malnutrition, severe ([place])   . Ventricular tachycardia (paroxysmal) ([place])   . Obstructive sleep apnea of adult     uses cpap, pt does not know setting  . Personal history of fall 05/29/2013  . Fracture of femoral neck, right, closed 3114  . Long term (current) use of anticoagulants     PAF and embolic CVA  . Anemia, unspecified 07/05/2013  . Xerophthalmia 07/05/2013  . 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 Actinic keratosis   . Seborrheic keratosis   . Aortic valve stenosis   . Cerebrovascular disease   . Elevated liver enzymes   . Diverticulosis   . Herpes zoster 02/08/14    Left facial nerve distribution  . Right hemiparesis ([place])   . Melanoma ([place]) 2009  . Hyperglycemia   . Xerophthalmia    Past Surgical History  Procedure Laterality Date  . Excision of melanoma  2009    left leg  . Melanoma excision      many melanoma removed in past  . Melanoma excision  10/11/2011    Procedure:  MELANOMA EXCISION;  Surgeon: Pedro Earls, MD;  Location: [place];  Service: General;  Laterality: Left;  EXCISION melanoma left leg with full thickness skin grafting from left lower abdomen.  . Joint replacement  2012    r fem head fx  . Lung benign area removed   1970's  . Hernia repair  1983  . Melanoma excision  05/16/2012    Procedure: MELANOMA EXCISION;  Surgeon: Pedro Earls, MD;  [facility];  Service: General;  Laterality: Left;  Nodule Removal of Melanoma on Left Shin  . Ercp N/A 07/04/2013    Procedure: ENDOSCOPIC RETROGRADE CHOLANGIOPANCREATOGRAPHY (ERCP);  Surgeon: Irene Shipper, MD;  Location: [facility];  Service: Endoscopy;   Laterality: N/A;    Allergies  Allergen Reactions  . Sulfonamide Derivatives Rash    REACTION: rash      Medication List       This list is accurate as of: 03/12/16 11:59 PM.  Always use your most recent med list.               acetaminophen 325 MG tablet  Commonly known as:  TYLENOL  Take 650 mg by mouth every 4 (four) hours as needed.     apixaban 5 MG Tabs tablet  Commonly known as:  ELIQUIS  Take 1 tablet (5 mg total) by mouth 2 (two) times daily.     bisacodyl 10 MG suppository  Commonly known as:  DULCOLAX  Place 10 mg rectally every 12 (twelve) hours as needed for moderate constipation.     carbamide peroxide 6.5 % otic solution  Commonly known as:  DEBROX  5 drops. 5 drops to each ear at bedtime on the 21st of each month     metoprolol tartrate 25 MG tablet  Commonly known as:  LOPRESSOR  Take 25 mg by mouth 2 (two) times daily.     SYSTANE 0.4-0.3 % Soln  Generic drug:  Polyethyl Glycol-Propyl Glycol  Apply to eye. One drop both eye four times daily     SYSTANE NIGHTTIME Oint  Place 1 application into both eyes at bedtime.        Review of Systems  Constitutional: Negative for fever and chills.  HENT: Positive for hearing loss. Negative for congestion, ear discharge, ear pain and nosebleeds.   Eyes: Negative for pain, discharge and redness.  Respiratory: Negative for cough, shortness of breath and wheezing.   Cardiovascular: Positive for leg swelling. Negative for chest pain and palpitations.       Trace  Gastrointestinal: Negative for nausea, vomiting, abdominal pain and constipation.  Genitourinary: Positive for frequency. Negative for dysuria and urgency.  Musculoskeletal: Negative for myalgias, back pain and neck pain.  Skin: Negative for rash.       Dry scaly skin. Top of left head skin lesion 2x2x1cm rough surface  Allergic/Immunologic: Negative for environmental allergies.  Neurological: Negative for dizziness, tremors, seizures, weakness  and headaches.  Hematological: Bruises/bleeds easily.  Psychiatric/Behavioral: Negative for suicidal ideas and hallucinations. The patient is nervous/anxious.     Immunization History  Administered Date(s) Administered  . Influenza Whole 10/06/2010, 08/07/2011, 09/20/2012  . Influenza-Unspecified 09/05/2013, 10/09/2014, 08/26/2015  . PPD Test 08/13/2013  . Pneumococcal Polysaccharide-23 05/06/1997  . Td 03/30/2005  . Tdap 05/29/2013   Pertinent  Health Maintenance Due  Topic Date Due  . PNA vac Low Risk Adult (2 of 2 - PCV13) 05/06/1998  . INFLUENZA VACCINE  07/06/2016   No flowsheet data found. Functional Status  Survey:    Filed Vitals:   03/12/16 1147  BP: 130/80  Pulse: 64  Temp: 98.5 F (36.9 C)  TempSrc: Oral  Resp: 20  Height: 5\' 3"  (1.6 m)  Weight: 163 lb 11.2 oz (74.254 kg)   Body mass index is 29.01 kg/(m^2). Physical Exam  Constitutional: He is oriented to person, place, and time. He appears well-developed and well-nourished. No distress.  HENT:  Head: Normocephalic and atraumatic.  Right Ear: External ear normal.  Left Ear: External ear normal.  Nose: Nose normal.  Mouth/Throat: No oropharyngeal exudate.  Eyes: Conjunctivae are normal. Pupils are equal, round, and reactive to light. Right eye exhibits no discharge. No scleral icterus.  Left eye blindness. The right lower eyelid ectropion.   Neck: Normal range of motion. Neck supple. No JVD present. No tracheal deviation present. No thyromegaly present.  Cardiovascular: Normal rate and normal heart sounds.  An irregular rhythm present.  Pulmonary/Chest: Effort normal and breath sounds normal. No respiratory distress. He has no wheezes. He has no rales. He exhibits no tenderness.  Abdominal: He exhibits no distension. There is no tenderness.  Musculoskeletal: Normal range of motion. He exhibits edema. He exhibits no tenderness.  The right sided weakness since previous CVA-muscle strength is still 5/5. The  right wrist and hand contractures-able to make fist and cannot extend fully. No longer ambulates with walker and w/c for mobility Trace pedal edema R>L Left sided weakness from the recent CVA-mild-grip strength 5/5  Neurological: He is alert and oriented to person, place, and time. He displays abnormal reflex. No cranial nerve deficit. Coordination normal.  the left sided decreased coordination-cannot transfer self independently and dropped his utensil, grip strength is still 5/5. Hx of right sided weakness with R hand contracture. Left facial weakness.   Skin: Skin is warm and dry. No rash noted. He is not diaphoretic. No erythema. No pallor.  he right cholecystostomy surgical scar. The medial left lower leg surgical scar from previous melanoma removal. Top of left head skin lesion 2x2x1cm rough surface, inflamed base.   Psychiatric: Thought content normal. His mood appears anxious. His affect is not angry and not inappropriate. His speech is not rapid and/or pressured, not delayed and not slurred. He is agitated. He is not aggressive, not hyperactive, not slowed and not withdrawn. Cognition and memory are impaired. He expresses impulsivity and inappropriate judgment. He does not exhibit a depressed mood. He exhibits abnormal recent memory. He exhibits normal remote memory.    Labs reviewed:  Recent Labs  09/30/15  NA 136*  K 5.0  BUN 21  CREATININE 1.1    Recent Labs  09/30/15  AST 14  ALT 9*  ALKPHOS 71    Recent Labs  09/30/15  WBC 7.3  HGB 12.0*  HCT 37*  PLT 267   Lab Results  Component Value Date   TSH 2.34 09/13/2013   Lab Results  Component Value Date   HGBA1C 5.6 02/03/2014   Lab Results  Component Value Date   CHOL 129 02/03/2014   HDL 45 02/03/2014   LDLCALC 64 02/03/2014   LDLDIRECT 139.4 04/26/2007   TRIG 99 02/03/2014   CHOLHDL 2.9 02/03/2014    Significant Diagnostic Results in last 30 days:  No results found.  Assessment/Plan  Atrial  fibrillation (HCC) Atrial fibrillation remained rate controlled. Patient was placed on Eliquis 5mg  bid per neurology's recommendations. Continue Metoprolol 25mg  bid, update CBC, CMP, TSH, Hgb A1c  Constipation Stable, continue Biscolax 10mg  suppository q12h prn,  MiraLax daily.   Contracture of joint of forearm Multiple sites, main in the right elbow and wrist, prn Tylenol needed for aches and pains  Edema On and off BLE  Essential hypertension Controlled, continue Metoprolol 25mg  bid  GERD Stable. off Omeprazole 20mg -refusal.  Hemiparesis as late effect of cerebrovascular accident (CVA) ([place]) Right sided weakness with right wrist/hand contracture. Grip strength is still 5/5. Ambulates with walker and w/c to go further. New onset of decreased the left sided coordination 01/2014. C/o worsened in his right sided weakness and stiffness in his left side. Continue anticoagulation therapy   Long term current use of anticoagulant therapy Eliquis since the past CVA 01/2014    Family/ staff Communication: continue SNF for care needs  Labs/tests ordered: CBC, CMP, TSH, Hgb A1c

## 2016-03-12 NOTE — Assessment & Plan Note (Signed)
Eliquis since the past CVA 01/2014  

## 2016-03-12 NOTE — Assessment & Plan Note (Signed)
Stable. off Omeprazole 20mg-refusal. 

## 2016-03-12 NOTE — Assessment & Plan Note (Signed)
Controlled, continue Metoprolol 25mg bid 

## 2016-03-12 NOTE — Assessment & Plan Note (Signed)
On and off BLE

## 2016-03-12 NOTE — Assessment & Plan Note (Signed)
Stable, continue Biscolax 10mg suppository q12h prn, MiraLax daily.     

## 2016-03-12 NOTE — Assessment & Plan Note (Signed)
Atrial fibrillation remained rate controlled. Patient was placed on Eliquis 5mg  bid per neurology's recommendations. Continue Metoprolol 25mg  bid, update CBC, CMP, TSH, Hgb A1c

## 2016-03-12 NOTE — Assessment & Plan Note (Signed)
Multiple sites, main in the right elbow and wrist, prn Tylenol needed for aches and pains

## 2016-03-16 LAB — CBC AND DIFFERENTIAL
HCT: 37 % — AB (ref 41–53)
Hemoglobin: 12 g/dL — AB (ref 13.5–17.5)
PLATELETS: 246 10*3/uL (ref 150–399)
WBC: 8.6 10*3/mL

## 2016-03-16 LAB — HEPATIC FUNCTION PANEL
ALT: 15 U/L (ref 10–40)
AST: 48 U/L — AB (ref 14–40)
Alkaline Phosphatase: 79 U/L (ref 25–125)
Bilirubin, Total: 0.5 mg/dL

## 2016-03-16 LAB — BASIC METABOLIC PANEL
BUN: 27 mg/dL — AB (ref 4–21)
CREATININE: 1.2 mg/dL (ref 0.6–1.3)
Glucose: 93 mg/dL
Potassium: 5.1 mmol/L (ref 3.4–5.3)
Sodium: 139 mmol/L (ref 137–147)

## 2016-03-16 LAB — TSH: TSH: 2.01 u[IU]/mL (ref 0.41–5.90)

## 2016-03-16 LAB — HEMOGLOBIN A1C: Hemoglobin A1C: 5.9

## 2016-04-19 ENCOUNTER — Encounter: Payer: Self-pay | Admitting: Nurse Practitioner

## 2016-04-19 ENCOUNTER — Non-Acute Institutional Stay (SKILLED_NURSING_FACILITY): Payer: Medicare Other | Admitting: Nurse Practitioner

## 2016-04-19 DIAGNOSIS — R609 Edema, unspecified: Secondary | ICD-10-CM

## 2016-04-19 DIAGNOSIS — D638 Anemia in other chronic diseases classified elsewhere: Secondary | ICD-10-CM

## 2016-04-19 DIAGNOSIS — I69359 Hemiplegia and hemiparesis following cerebral infarction affecting unspecified side: Secondary | ICD-10-CM

## 2016-04-19 DIAGNOSIS — K219 Gastro-esophageal reflux disease without esophagitis: Secondary | ICD-10-CM | POA: Diagnosis not present

## 2016-04-19 DIAGNOSIS — K5900 Constipation, unspecified: Secondary | ICD-10-CM | POA: Diagnosis not present

## 2016-04-19 DIAGNOSIS — D649 Anemia, unspecified: Secondary | ICD-10-CM

## 2016-04-19 DIAGNOSIS — I480 Paroxysmal atrial fibrillation: Secondary | ICD-10-CM

## 2016-04-19 DIAGNOSIS — I10 Essential (primary) hypertension: Secondary | ICD-10-CM | POA: Diagnosis not present

## 2016-04-19 DIAGNOSIS — Z7901 Long term (current) use of anticoagulants: Secondary | ICD-10-CM | POA: Diagnosis not present

## 2016-04-19 DIAGNOSIS — M24531 Contracture, right wrist: Secondary | ICD-10-CM | POA: Diagnosis not present

## 2016-04-19 NOTE — Assessment & Plan Note (Signed)
Right sided weakness with right wrist/hand contracture. Grip strength is still 5/5. Ambulates with walker and w/c to go further. New onset of decreased the left sided coordination 01/2014. C/o worsened in his right sided weakness and stiffness in his left side. Continue anticoagulation therapy

## 2016-04-19 NOTE — Assessment & Plan Note (Signed)
Atrial fibrillation remained rate controlled. Patient was placed on Eliquis 5mg  bid per neurology's recommendations. Continue Metoprolol 25mg  bid,

## 2016-04-19 NOTE — Assessment & Plan Note (Addendum)
Stable, continue Biscolax 10mg  suppository q12h prn, MiraLax daily.

## 2016-04-19 NOTE — Assessment & Plan Note (Signed)
On and off BLE

## 2016-04-19 NOTE — Progress Notes (Signed)
Patient ID: [name], male   DOB: 01-11-1919, 82 y.o.   MRN: [id number]  Location:  [facility] Room Number: 63 Place of Service:  SNF (31) Provider: Lennie Odor Graden Hoshino NP  GREEN, Viviann Spare, MD  Patient Care Team: Estill Dooms, MD as PCP - General (Internal Medicine) Kathrynn Ducking, MD as Consulting Physician (Neurology) Fanny Skates, MD as Consulting Physician (General Surgery) Irene Shipper, MD as Consulting Physician (Gastroenterology) Thayer Headings, MD as Consulting Physician (Cardiology) Tanda Rockers, MD as Consulting Physician (Pulmonary Disease)  Extended Emergency Contact Information Primary Emergency Contact: Lesage,Williams (Rod) Address: [address]          [place], [place] [address] [place] Phone: [phone] Relation: Son  Code Status:  DNR Goals of care: Advanced Directive information Advanced Directives 04/19/2016  Does patient have an advance directive? Yes  Type of Advance Directive Living will;Out of facility DNR (pink MOST or yellow form)  Does patient want to make changes to advanced directive? No - Patient declined  Copy of advanced directive(s) in chart? Yes     Chief Complaint  Patient presents with  . Medical Management of Chronic Issues    Routine Visit    HPI:  Pt is a 82 y.o. male seen today for medical management of chronic diseases.  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   Past Medical History  Diagnosis Date  . GERD (gastroesophageal reflux disease)   . Hypertension   . Hearing loss     wears hearing aid - right side  . Leg swelling   . Dysphagia   . Bruises easily     due to coumadin  . Weakness     difficulty walking  . Contact lens/glasses fitting   . Stroke [facility]) 1999, 02/02/14  . Cancer ([place])   . 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 Gait disturbance   .  COPD (chronic obstructive pulmonary disease) ([place])   . Peripheral vascular disease ([place])   . History of melanoma   . Diverticulosis   . Abnormality of gait 05/16/2013  . Choledocholithiasis with obstruction 06/28/13  . Embolic stroke ([place]) [id number]  . PAF (paroxysmal atrial fibrillation) ([place])   . Hyperlipidemia   . Bradycardia   . Protein-calorie malnutrition, severe ([place])   . Ventricular tachycardia (paroxysmal) ([place])   . Obstructive sleep apnea of adult     uses cpap, pt does not know setting  . Personal history of fall 05/29/2013  . Fracture of femoral neck, right, closed 3114  . Long term (current) use of anticoagulants     PAF and embolic CVA  . Anemia, unspecified 07/05/2013  . Xerophthalmia 07/05/2013  . 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 Actinic keratosis   . Seborrheic keratosis   . Aortic valve stenosis   . Cerebrovascular disease   . Elevated liver enzymes   . Diverticulosis   . Herpes zoster 02/08/14    Left facial nerve distribution  . Right hemiparesis ([place])   . Melanoma ([place]) 2009  . Hyperglycemia   . Xerophthalmia    Past Surgical History  Procedure Laterality Date  . Excision of melanoma  2009    left leg  . Melanoma excision      many melanoma removed in past  . Melanoma excision  10/11/2011    Procedure: MELANOMA EXCISION;  Surgeon:  Pedro Earls, MD;  Location: [place];  Service: General;  Laterality: Left;  EXCISION melanoma left leg with full thickness skin grafting from left lower abdomen.  . Joint replacement  2012    r fem head fx  . Lung benign area removed   1970's  . Hernia repair  1983  . Melanoma excision  05/16/2012    Procedure: MELANOMA EXCISION;  Surgeon: Pedro Earls, MD;  [facility];  Service: General;  Laterality: Left;  Nodule Removal of Melanoma on Left Shin  . Ercp N/A 07/04/2013    Procedure: ENDOSCOPIC RETROGRADE CHOLANGIOPANCREATOGRAPHY (ERCP);  Surgeon: Irene Shipper, MD;  Location: [facility];  Service: Endoscopy;  Laterality:  N/A;    Allergies  Allergen Reactions  . Sulfonamide Derivatives Rash    REACTION: rash      Medication List       This list is accurate as of: 04/19/16  3:39 PM.  Always use your most recent med list.               acetaminophen 325 MG tablet  Commonly known as:  TYLENOL  Take 650 mg by mouth every 4 (four) hours as needed.     apixaban 5 MG Tabs tablet  Commonly known as:  ELIQUIS  Take 1 tablet (5 mg total) by mouth 2 (two) times daily.     bisacodyl 10 MG suppository  Commonly known as:  DULCOLAX  Place 10 mg rectally every 12 (twelve) hours as needed for moderate constipation.     carbamide peroxide 6.5 % otic solution  Commonly known as:  DEBROX  5 drops. 5 drops to each ear at bedtime on the 21st of each month     metoprolol tartrate 25 MG tablet  Commonly known as:  LOPRESSOR  Take 25 mg by mouth 2 (two) times daily.     SYSTANE 0.4-0.3 % Soln  Generic drug:  Polyethyl Glycol-Propyl Glycol  Apply to eye. One drop both eye four times daily     SYSTANE NIGHTTIME Oint  Place 1 application into both eyes at bedtime.        Review of Systems  Constitutional: Negative for fever and chills.  HENT: Positive for facial swelling and hearing loss. Negative for congestion, ear discharge, ear pain and nosebleeds.   Eyes: Negative for pain, discharge and redness.  Respiratory: Negative for cough, shortness of breath and wheezing.   Cardiovascular: Positive for leg swelling. Negative for chest pain and palpitations.       Trace  Gastrointestinal: Positive for constipation. Negative for nausea, vomiting and abdominal pain.  Genitourinary: Positive for frequency. Negative for dysuria and urgency.  Musculoskeletal: Negative for myalgias, back pain and neck pain.  Skin: Negative for rash.       Dry scaly skin. Top of left head skin lesion 2x2x1cm rough surface  Allergic/Immunologic: Negative for environmental allergies.  Neurological: Negative for dizziness, tremors,  seizures, weakness and headaches.  Hematological: Bruises/bleeds easily.  Psychiatric/Behavioral: Negative for suicidal ideas and hallucinations. The patient is nervous/anxious.     Immunization History  Administered Date(s) Administered  . Influenza Whole 10/06/2010, 08/07/2011, 09/20/2012  . Influenza-Unspecified 09/05/2013, 10/09/2014, 08/26/2015  . PPD Test 08/13/2013  . Pneumococcal Polysaccharide-23 05/06/1997  . Td 03/30/2005  . Tdap 05/29/2013   Pertinent  Health Maintenance Due  Topic Date Due  . PNA vac Low Risk Adult (2 of 2 - PCV13) 05/06/1998  . INFLUENZA VACCINE  07/06/2016   No flowsheet data found.  Functional Status Survey:    Filed Vitals:   04/19/16 0912  BP: 130/80  Pulse: 64  Temp: 98.5 F (36.9 C)  TempSrc: Oral  Resp: 20  Height: 5\' 3"  (1.6 m)  Weight: 166 lb (75.297 kg)   Body mass index is 29.41 kg/(m^2). Physical Exam  Constitutional: He is oriented to person, place, and time. He appears well-developed and well-nourished. No distress.  HENT:  Head: Normocephalic and atraumatic.  Right Ear: External ear normal.  Left Ear: External ear normal.  Nose: Nose normal.  Mouth/Throat: No oropharyngeal exudate.  Eyes: Conjunctivae are normal. Pupils are equal, round, and reactive to light. Right eye exhibits no discharge. No scleral icterus.  Left eye blindness. The right lower eyelid ectropion.   Neck: Normal range of motion. Neck supple. No JVD present. No tracheal deviation present. No thyromegaly present.  Cardiovascular: Normal rate and normal heart sounds.  An irregular rhythm present.  Pulmonary/Chest: Effort normal and breath sounds normal. No respiratory distress. He has no wheezes. He has no rales. He exhibits no tenderness.  Abdominal: He exhibits no distension. There is no tenderness.  Musculoskeletal: Normal range of motion. He exhibits edema. He exhibits no tenderness.  The right sided weakness since previous CVA-muscle strength is still  5/5. The right wrist and hand contractures-able to make fist and cannot extend fully. No longer ambulates with walker and w/c for mobility Trace pedal edema R>L Left sided weakness from the recent CVA-mild-grip strength 5/5  Neurological: He is alert and oriented to person, place, and time. He displays abnormal reflex. No cranial nerve deficit. Coordination normal.  the left sided decreased coordination-cannot transfer self independently and dropped his utensil, grip strength is still 5/5. Hx of right sided weakness with R hand contracture. Left facial weakness.   Skin: Skin is warm and dry. No rash noted. He is not diaphoretic. No erythema. No pallor.  he right cholecystostomy surgical scar. The medial left lower leg surgical scar from previous melanoma removal. Top of left head skin lesion 2x2x1cm rough surface, inflamed base.   Psychiatric: Thought content normal. His mood appears anxious. His affect is not angry and not inappropriate. His speech is not rapid and/or pressured, not delayed and not slurred. He is agitated. He is not aggressive, not hyperactive, not slowed and not withdrawn. Cognition and memory are impaired. He expresses impulsivity and inappropriate judgment. He does not exhibit a depressed mood. He exhibits abnormal recent memory. He exhibits normal remote memory.    Labs reviewed:  Recent Labs  09/30/15 03/16/16  NA 136* 139  K 5.0 5.1  BUN 21 27*  CREATININE 1.1 1.2    Recent Labs  09/30/15 03/16/16  AST 14 48*  ALT 9* 15  ALKPHOS 71 79    Recent Labs  09/30/15 03/16/16  WBC 7.3 8.6  HGB 12.0* 12.0*  HCT 37* 37*  PLT 267 246   Lab Results  Component Value Date   TSH 2.01 03/16/2016   Lab Results  Component Value Date   HGBA1C 5.9 03/16/2016   Lab Results  Component Value Date   CHOL 129 02/03/2014   HDL 45 02/03/2014   LDLCALC 64 02/03/2014   LDLDIRECT 139.4 04/26/2007   TRIG 99 02/03/2014   CHOLHDL 2.9 02/03/2014    Significant Diagnostic  Results in last 30 days:  No results found.  Assessment/Plan  Anemia of chronic disease 03/16/16 Hgb 12.0  Anemia, unspecified 03/16/16 Hgb 12.0   Atrial fibrillation (HCC) Atrial fibrillation remained rate controlled.  Patient was placed on Eliquis 5mg  bid per neurology's recommendations. Continue Metoprolol 25mg  bid,  Constipation Stable, continue Biscolax 10mg  suppository q12h prn, MiraLax daily.    Contracture of joint of forearm Multiple sites, main in the right elbow and wrist, prn Tylenol needed for aches and pains   Edema On and off BLE   Essential hypertension Controlled, continue Metoprolol 25mg  bid   GERD Stable. off Omeprazole 20mg -refusal.   Hemiparesis as late effect of cerebrovascular accident (CVA) ([place]) Right sided weakness with right wrist/hand contracture. Grip strength is still 5/5. Ambulates with walker and w/c to go further. New onset of decreased the left sided coordination 01/2014. C/o worsened in his right sided weakness and stiffness in his left side. Continue anticoagulation therapy    Long term current use of anticoagulant therapy Eliquis since the past CVA 01/2014     Family/ staff Communication: continue SNF for care needs  Labs/tests ordered: none

## 2016-04-19 NOTE — Assessment & Plan Note (Signed)
Eliquis since the past CVA 01/2014  

## 2016-04-19 NOTE — Assessment & Plan Note (Signed)
Multiple sites, main in the right elbow and wrist, prn Tylenol needed for aches and pains

## 2016-04-19 NOTE — Assessment & Plan Note (Signed)
Stable. off Omeprazole 20mg-refusal. 

## 2016-04-19 NOTE — Assessment & Plan Note (Signed)
03/16/16 Hgb 12.0

## 2016-04-19 NOTE — Assessment & Plan Note (Signed)
Controlled, continue Metoprolol 25mg bid 

## 2016-05-24 ENCOUNTER — Non-Acute Institutional Stay: Payer: Medicare Other | Admitting: Nurse Practitioner

## 2016-05-24 ENCOUNTER — Encounter: Payer: Self-pay | Admitting: Nurse Practitioner

## 2016-05-24 DIAGNOSIS — M6289 Other specified disorders of muscle: Secondary | ICD-10-CM

## 2016-05-24 DIAGNOSIS — I480 Paroxysmal atrial fibrillation: Secondary | ICD-10-CM

## 2016-05-24 DIAGNOSIS — D638 Anemia in other chronic diseases classified elsewhere: Secondary | ICD-10-CM | POA: Diagnosis not present

## 2016-05-24 DIAGNOSIS — R531 Weakness: Secondary | ICD-10-CM

## 2016-05-24 DIAGNOSIS — K5900 Constipation, unspecified: Secondary | ICD-10-CM | POA: Diagnosis not present

## 2016-05-24 DIAGNOSIS — I10 Essential (primary) hypertension: Secondary | ICD-10-CM

## 2016-05-24 DIAGNOSIS — K219 Gastro-esophageal reflux disease without esophagitis: Secondary | ICD-10-CM

## 2016-05-24 DIAGNOSIS — R609 Edema, unspecified: Secondary | ICD-10-CM | POA: Diagnosis not present

## 2016-05-24 DIAGNOSIS — I69359 Hemiplegia and hemiparesis following cerebral infarction affecting unspecified side: Secondary | ICD-10-CM

## 2016-05-24 NOTE — Assessment & Plan Note (Signed)
Atrial fibrillation remained rate controlled. Patient was placed on Eliquis 5mg  bid per neurology's recommendations. Continue Metoprolol 25mg  bid,

## 2016-05-24 NOTE — Assessment & Plan Note (Signed)
Controlled, continue Metoprolol 25mg bid 

## 2016-05-24 NOTE — Assessment & Plan Note (Signed)
Right sided weakness with right wrist/hand contracture. Grip strength is still 5/5. Ambulates with walker and w/c to go further. New onset of decreased the left sided coordination 01/2014. C/o worsened in his right sided weakness and stiffness. Continue anticoagulation therapy. 

## 2016-05-24 NOTE — Progress Notes (Signed)
Patient ID: [name], male   DOB: [birth year], 82 y.o.   MRN: [id number]  Location:   SNF [place] Room Number: 20 Place of Service:   SNF [facility] Provider: [facility] Mast NP  Jeanmarie Hubert, MD  Patient Care Team: Estill Dooms, MD as PCP - General (Internal Medicine) Kathrynn Ducking, MD as Consulting Physician (Neurology) Fanny Skates, MD as Consulting Physician (General Surgery) Irene Shipper, MD as Consulting Physician (Gastroenterology) Thayer Headings, MD as Consulting Physician (Cardiology) Tanda Rockers, MD as Consulting Physician (Pulmonary Disease)  Extended Emergency Contact Information Primary Emergency Contact: Quinlivan,Williams (Rod) Address: [address]          [place], [place] [address] [place] Phone: [phone] Relation: Son  Code Status:  DNR Goals of care: Advanced Directive information Advanced Directives 06/11/2016  Does patient have an advance directive? Yes  Type of Advance Directive Out of facility DNR (pink MOST or yellow form)  Does patient want to make changes to advanced directive? -  Copy of advanced directive(s) in chart? Yes  Pre-existing out of facility DNR order (yellow form or pink MOST form) Yellow form placed in chart (order not valid for inpatient use)     Chief Complaint  Patient presents with  . Medical Management of Chronic Issues    HPI:  Pt is a 82 y.o. male seen today for medical management of chronic diseases.  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   Past Medical History  Diagnosis Date  . GERD (gastroesophageal reflux disease)   . Hypertension   . Hearing loss     wears hearing aid - right side  . Leg swelling   . Dysphagia   . Bruises easily     due to coumadin  . Weakness     difficulty walking  . Contact lens/glasses fitting   . Stroke [facility]) 1999, 02/02/14    . Cancer ([place])   . 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 Gait disturbance   . COPD (chronic obstructive pulmonary disease) ([place])   . Peripheral vascular disease ([place])   . History of melanoma   . Diverticulosis   . Abnormality of gait 05/16/2013  . Choledocholithiasis with obstruction 06/28/13  . Embolic stroke ([place]) [id number]  . PAF (paroxysmal atrial fibrillation) ([place])   . Hyperlipidemia   . Bradycardia   . Protein-calorie malnutrition, severe ([place])   . Ventricular tachycardia (paroxysmal) ([place])   . Obstructive sleep apnea of adult     uses cpap, pt does not know setting  . Personal history of fall 05/29/2013  . Fracture of femoral neck, right, closed 3114  . Long term (current) use of anticoagulants     PAF and embolic CVA  . Anemia, unspecified 07/05/2013  . Xerophthalmia 07/05/2013  . 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 Actinic keratosis   . Seborrheic keratosis   . Aortic valve stenosis   . Cerebrovascular disease   . Elevated liver enzymes   . Diverticulosis   . Herpes zoster 02/08/14    Left facial nerve distribution  . Right hemiparesis ([place])   . Melanoma ([place]) 2009  . Hyperglycemia   . Xerophthalmia    Past Surgical History  Procedure Laterality Date  . Excision of melanoma  2009    left leg  . Melanoma excision      many melanoma removed  in past  . Melanoma excision  10/11/2011    Procedure: MELANOMA EXCISION;  Surgeon: Pedro Earls, MD;  Location: [place];  Service: General;  Laterality: Left;  EXCISION melanoma left leg with full thickness skin grafting from left lower abdomen.  . Joint replacement  2012    r fem head fx  . Lung benign area removed   1970's  . Hernia repair  1983  . Melanoma excision  05/16/2012    Procedure: MELANOMA EXCISION;  Surgeon: Pedro Earls, MD;  [facility];  Service: General;  Laterality: Left;  Nodule Removal of Melanoma on Left Shin  . Ercp N/A 07/04/2013    Procedure: ENDOSCOPIC RETROGRADE CHOLANGIOPANCREATOGRAPHY (ERCP);  Surgeon: Irene Shipper, MD;  Location: [facility];  Service: Endoscopy;  Laterality: N/A;    Allergies  Allergen Reactions  . Sulfonamide Derivatives Rash    REACTION: rash      Medication List       This list is accurate as of: 05/24/16 11:59 PM.  Always use your most recent med list.               acetaminophen 325 MG tablet  Commonly known as:  TYLENOL  Take 650 mg by mouth every 4 (four) hours as needed.     apixaban 5 MG Tabs tablet  Commonly known as:  ELIQUIS  Take 1 tablet (5 mg total) by mouth 2 (two) times daily.     bisacodyl 10 MG suppository  Commonly known as:  DULCOLAX  Place 10 mg rectally every 12 (twelve) hours as needed for moderate constipation.     carbamide peroxide 6.5 % otic solution  Commonly known as:  DEBROX  5 drops. 5 drops to each ear at bedtime on the 21st of each month     metoprolol tartrate 25 MG tablet  Commonly known as:  LOPRESSOR  Take 25 mg by mouth 2 (two) times daily.     SYSTANE 0.4-0.3 % Soln  Generic drug:  Polyethyl Glycol-Propyl Glycol  Apply to eye. One drop both eye four times daily     SYSTANE NIGHTTIME Oint  1 application. Apply to right eye at bedtime        Review of Systems  Constitutional: Negative for fever and chills.  HENT: Positive for facial swelling and hearing loss. Negative for congestion, ear discharge, ear pain and nosebleeds.   Eyes: Negative for pain, discharge and redness.  Respiratory: Negative for cough, shortness of breath and wheezing.   Cardiovascular: Positive for leg swelling. Negative for chest pain and palpitations.       Trace  Gastrointestinal: Positive for constipation. Negative for nausea, vomiting and abdominal pain.  Genitourinary: Positive for frequency. Negative for dysuria and urgency.  Musculoskeletal: Negative for myalgias, back pain and neck pain.  Skin: Negative for rash.       Dry scaly skin. Top of left head skin lesion 2x2x1cm rough surface  Allergic/Immunologic: Negative for  environmental allergies.  Neurological: Negative for dizziness, tremors, seizures, weakness and headaches.  Hematological: Bruises/bleeds easily.  Psychiatric/Behavioral: Negative for suicidal ideas and hallucinations. The patient is nervous/anxious.     Immunization History  Administered Date(s) Administered  . Influenza Whole 10/06/2010, 08/07/2011, 09/20/2012  . Influenza-Unspecified 09/05/2013, 10/09/2014, 08/26/2015  . PPD Test 08/13/2013  . Pneumococcal Polysaccharide-23 05/06/1997  . Td 03/30/2005  . Tdap 05/29/2013   Pertinent  Health Maintenance Due  Topic Date Due  . PNA vac Low Risk Adult (2 of 2 -  PCV13) 05/06/1998  . INFLUENZA VACCINE  07/06/2016   No flowsheet data found. Functional Status Survey:    Filed Vitals:   05/24/16 1344  BP: 114/68  Pulse: 47  Temp: 97.8 F (36.6 C)  TempSrc: Tympanic  Resp: 16  Height: 5\' 3"  (1.6 m)  Weight: 163 lb 4.8 oz (74.072 kg)   Body mass index is 28.93 kg/(m^2). Physical Exam  Constitutional: He is oriented to person, place, and time. He appears well-developed and well-nourished. No distress.  HENT:  Head: Normocephalic and atraumatic.  Right Ear: External ear normal.  Left Ear: External ear normal.  Nose: Nose normal.  Mouth/Throat: No oropharyngeal exudate.  Eyes: Conjunctivae are normal. Pupils are equal, round, and reactive to light. Right eye exhibits no discharge. No scleral icterus.  Left eye blindness. The right lower eyelid ectropion.   Neck: Normal range of motion. Neck supple. No JVD present. No tracheal deviation present. No thyromegaly present.  Cardiovascular: Normal rate and normal heart sounds.  An irregular rhythm present.  Pulmonary/Chest: Effort normal and breath sounds normal. No respiratory distress. He has no wheezes. He has no rales. He exhibits no tenderness.  Abdominal: He exhibits no distension. There is no tenderness.  Musculoskeletal: Normal range of motion. He exhibits edema. He exhibits  no tenderness.  The right sided weakness since previous CVA-muscle strength is still 5/5. The right wrist and hand contractures-able to make fist and cannot extend fully. No longer ambulates with walker and w/c for mobility Trace pedal edema R>L Left sided weakness from the recent CVA-mild-grip strength 5/5  Neurological: He is alert and oriented to person, place, and time. He displays abnormal reflex. No cranial nerve deficit. Coordination normal.  the left sided decreased coordination-cannot transfer self independently and dropped his utensil, grip strength is still 5/5. Hx of right sided weakness with R hand contracture. Left facial weakness.   Skin: Skin is warm and dry. No rash noted. He is not diaphoretic. No erythema. No pallor.  he right cholecystostomy surgical scar. The medial left lower leg surgical scar from previous melanoma removal. Top of left head skin lesion 2x2x1cm rough surface, inflamed base.   Psychiatric: Thought content normal. His mood appears anxious. His affect is not angry and not inappropriate. His speech is not rapid and/or pressured, not delayed and not slurred. He is agitated. He is not aggressive, not hyperactive, not slowed and not withdrawn. Cognition and memory are impaired. He expresses impulsivity and inappropriate judgment. He does not exhibit a depressed mood. He exhibits abnormal recent memory. He exhibits normal remote memory.    Labs reviewed:  Recent Labs  09/30/15 03/16/16  NA 136* 139  K 5.0 5.1  BUN 21 27*  CREATININE 1.1 1.2    Recent Labs  09/30/15 03/16/16  AST 14 48*  ALT 9* 15  ALKPHOS 71 79    Recent Labs  09/30/15 03/16/16  WBC 7.3 8.6  HGB 12.0* 12.0*  HCT 37* 37*  PLT 267 246   Lab Results  Component Value Date   TSH 2.01 03/16/2016   Lab Results  Component Value Date   HGBA1C 5.9 03/16/2016   Lab Results  Component Value Date   CHOL 129 02/03/2014   HDL 45 02/03/2014   LDLCALC 64 02/03/2014   LDLDIRECT 139.4  04/26/2007   TRIG 99 02/03/2014   CHOLHDL 2.9 02/03/2014    Significant Diagnostic Results in last 30 days:  No results found.  Assessment/Plan  Essential hypertension Controlled, continue Metoprolol 25mg  bid  Atrial fibrillation (HCC) Atrial fibrillation remained rate controlled. Patient was placed on Eliquis 5mg  bid per neurology's recommendations. Continue Metoprolol 25mg  bid,  GERD Stable. off Omeprazole 20mg -refusal.  Constipation Stable, continue Biscolax 10mg  suppository q12h prn, MiraLax daily.    Weakness of right side of body Right sided weakness, contractures of the RUE and RLE, PT to eval and tx  Hemiparesis as late effect of cerebrovascular accident (CVA) ([place]) Right sided weakness with right wrist/hand contracture. Grip strength is still 5/5. Ambulates with walker and w/c to go further. New onset of decreased the left sided coordination 01/2014. C/o worsened in his right sided weakness and stiffness. Continue anticoagulation therapy    Edema On and off BLE    Anemia of chronic disease 03/16/16 Hgb 12.0    Family/ staff Communication: continue SNF for care needs. PT to eval and tx RUE and RLE strengthening, ROM, therapeutic exercise, ambulation, balance.   Labs/tests ordered: none

## 2016-05-24 NOTE — Assessment & Plan Note (Signed)
Stable. off Omeprazole 20mg-refusal. 

## 2016-05-24 NOTE — Assessment & Plan Note (Signed)
03/16/16 Hgb 12.0

## 2016-05-24 NOTE — Assessment & Plan Note (Signed)
On and off BLE

## 2016-05-24 NOTE — Assessment & Plan Note (Signed)
Right sided weakness, contractures of the RUE and RLE, PT to eval and tx

## 2016-05-24 NOTE — Assessment & Plan Note (Signed)
Stable, continue Biscolax 10mg  suppository q12h prn, MiraLax daily.

## 2016-06-04 ENCOUNTER — Encounter: Payer: Self-pay | Admitting: Adult Health

## 2016-06-04 NOTE — Progress Notes (Signed)
Patient ID: [name], male   DOB: Feb 02, 1919, 82 y.o.   MRN: [id number]   Location:   [place] Room Number: 52-A Place of Service:  SNF (31)   CODE STATUS: DNR  Allergies  Allergen Reactions  . Sulfonamide Derivatives Rash    REACTION: rash    Chief Complaint  Patient presents with  . Acute Visit    Right hand pain    HPI:    Past Medical History  Diagnosis Date  . GERD (gastroesophageal reflux disease)   . Hypertension   . Hearing loss     wears hearing aid - right side  . Leg swelling   . Dysphagia   . Bruises easily     due to coumadin  . Weakness     difficulty walking  . Contact lens/glasses fitting   . Stroke [facility]) 1999, 02/02/14  . Cancer ([place])   . 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 Gait disturbance   . COPD (chronic obstructive pulmonary disease) ([place])   . Peripheral vascular disease ([place])   . History of melanoma   . Diverticulosis   . Abnormality of gait 05/16/2013  . Choledocholithiasis with obstruction 06/28/13  . Embolic stroke ([place]) [id number]  . PAF (paroxysmal atrial fibrillation) ([place])   . Hyperlipidemia   . Bradycardia   . Protein-calorie malnutrition, severe ([place])   . Ventricular tachycardia (paroxysmal) ([place])   . Obstructive sleep apnea of adult     uses cpap, pt does not know setting  . Personal history of fall 05/29/2013  . Fracture of femoral neck, right, closed 3114  . Long term (current) use of anticoagulants     PAF and embolic CVA  . Anemia, unspecified 07/05/2013  . Xerophthalmia 07/05/2013  . 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 Actinic keratosis   . Seborrheic keratosis   . Aortic valve stenosis   . Cerebrovascular disease   . Elevated liver enzymes   . Diverticulosis   . Herpes zoster 02/08/14    Left facial nerve distribution  . Right hemiparesis ([place])   . Melanoma ([place]) 2009  . Hyperglycemia   . Xerophthalmia     Past Surgical History  Procedure Laterality Date  . Excision of melanoma  2009    left leg    . Melanoma excision      many melanoma removed in past  . Melanoma excision  10/11/2011    Procedure: MELANOMA EXCISION;  Surgeon: Pedro Earls, MD;  Location: [place];  Service: General;  Laterality: Left;  EXCISION melanoma left leg with full thickness skin grafting from left lower abdomen.  . Joint replacement  2012    r fem head fx  . Lung benign area removed   1970's  . Hernia repair  1983  . Melanoma excision  05/16/2012    Procedure: MELANOMA EXCISION;  Surgeon: Pedro Earls, MD;  [facility];  Service: General;  Laterality: Left;  Nodule Removal of Melanoma on Left Shin  . Ercp N/A 07/04/2013    Procedure: ENDOSCOPIC RETROGRADE CHOLANGIOPANCREATOGRAPHY (ERCP);  Surgeon: Irene Shipper, MD;  Location: [facility];  Service: Endoscopy;  Laterality: N/A;    Social History   Social History  . Marital Status: Widowed    Spouse Name: N/A  . Number of Children: 2  . Years of Education: MBA   Occupational History  . Retired    Social History Main Topics  . Smoking status: Former Smoker    Types:  Pipe    Quit date: 08/31/1964  . Smokeless tobacco: Never Used  . Alcohol Use: No  . Drug Use: No  . Sexual Activity: No   Other Topics Concern  . Not on file   Social History Narrative   Lives at [facility], [place] since 02/01/14   Widowed   Never smoked   Alcohol none   DNR, LW   Family History  Problem Relation Age of Onset  . Heart disease Father 39  . Cancer Son     prostate  . Dementia Sister     One sister has dementia      VITAL SIGNS BP 120/78 mmHg  Pulse 68  Temp(Src) 97.8 F (36.6 C) (Oral)  Resp 18  Ht 5\' 3"  (1.6 m)  Wt 163 lb 3 oz (74.021 kg)  BMI 28.91 kg/m2  Patient's Medications  New Prescriptions   No medications on file  Previous Medications   ACETAMINOPHEN (TYLENOL) 325 MG TABLET    Take 650 mg by mouth every 4 (four) hours as needed.    APIXABAN (ELIQUIS) 5 MG TABS TABLET    Take 1 tablet (5 mg total) by mouth 2 (two)  times daily.   BISACODYL (DULCOLAX) 10 MG SUPPOSITORY    Place 10 mg rectally every 12 (twelve) hours as needed for moderate constipation.   CARBAMIDE PEROXIDE (DEBROX) 6.5 % OTIC SOLUTION    5 drops. 5 drops to each ear at bedtime on the 21st of each month   METOPROLOL TARTRATE (LOPRESSOR) 25 MG TABLET    Take 25 mg by mouth 2 (two) times daily.   POLYETHYL GLYCOL-PROPYL GLYCOL (SYSTANE) 0.4-0.3 % SOLN    Apply to eye. One drop both eye four times daily   WHITE PETROLATUM-MINERAL OIL (SYSTANE NIGHTTIME) OINT    Place 1 application into both eyes at bedtime.  Modified Medications   No medications on file  Discontinued Medications   No medications on file     SIGNIFICANT DIAGNOSTIC EXAMS       ASSESSMENT/ PLAN:    Ok Edwards NP [facility]  Contact [phone] Monday through Friday 8am- 5pm  After hours call [phone]

## 2016-06-11 ENCOUNTER — Non-Acute Institutional Stay (SKILLED_NURSING_FACILITY): Payer: Medicare Other | Admitting: Internal Medicine

## 2016-06-11 ENCOUNTER — Encounter: Payer: Self-pay | Admitting: Internal Medicine

## 2016-06-11 DIAGNOSIS — R1310 Dysphagia, unspecified: Secondary | ICD-10-CM | POA: Diagnosis not present

## 2016-06-11 DIAGNOSIS — I10 Essential (primary) hypertension: Secondary | ICD-10-CM

## 2016-06-11 DIAGNOSIS — M6289 Other specified disorders of muscle: Secondary | ICD-10-CM

## 2016-06-11 DIAGNOSIS — R531 Weakness: Secondary | ICD-10-CM

## 2016-06-11 NOTE — Progress Notes (Signed)
Patient ID: CHON STANDARD, male   DOB: 06-16-1919, 82 y.o.   MRN: [id number]  Location:    [facility] Room Number: N52 Place of Service:  SNF (31) Provider:  Estill Dooms, MD  Patient Care Team: Estill Dooms, MD as PCP - General (Internal Medicine) Kathrynn Ducking, MD as Consulting Physician (Neurology) Fanny Skates, MD as Consulting Physician (General Surgery) Irene Shipper, MD as Consulting Physician (Gastroenterology) Thayer Headings, MD as Consulting Physician (Cardiology) Tanda Rockers, MD as Consulting Physician (Pulmonary Disease)  Extended Emergency Contact Information Primary Emergency Contact: Onstott,Williams (Rod) Address: [address]          [place], [place] [address] [place] Phone: [phone] Relation: Son  Goals of care: Advanced Directive information Advanced Directives 06/11/2016  Does patient have an advance directive? Yes  Type of Advance Directive Out of facility DNR (pink MOST or yellow form)  Does patient want to make changes to advanced directive? -  Copy of advanced directive(s) in chart? Yes  Pre-existing out of facility DNR order (yellow form or pink MOST form) Yellow form placed in chart (order not valid for inpatient use)     Chief Complaint  Patient presents with  . Acute Visit    right hand pain    HPI:  Pt is a 82 y.o. male seen today for an acute visit for Evaluation of increased weakness of the right side. In 2015 he was noted to have a CVA with MRI findings of a small acute left lateral medullary and left anterior frontal lobe infarct. Despite patient's concern is weakness in the right side is creased, as best I can recall, it appears to be much the same. He has not had much functional utility on this side since coming under my care. There is also a flexion contracture developing at the wrist and elbow. He has posted on his wall a series of exercises that he should attempt daily. He says that he is not  able to do these exercises as in the past.  He denies recent headache, visual change, fever, neck pain, or difficulty swallowing.   Past Medical History  Diagnosis Date  . GERD (gastroesophageal reflux disease)   . Hypertension   . Hearing loss     wears hearing aid - right side  . Leg swelling   . Dysphagia   . Bruises easily     due to coumadin  . Weakness     difficulty walking  . Contact lens/glasses fitting   . Stroke [facility]) 1999, 02/02/14  . Cancer ([place])   . 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 Gait disturbance   . COPD (chronic obstructive pulmonary disease) ([place])   . Peripheral vascular disease ([place])   . History of melanoma   . Diverticulosis   . Abnormality of gait 05/16/2013  . Choledocholithiasis with obstruction 06/28/13  . Embolic stroke ([place]) [id number]  . PAF (paroxysmal atrial fibrillation) ([place])   . Hyperlipidemia   . Bradycardia   . Protein-calorie malnutrition, severe ([place])   . Ventricular tachycardia (paroxysmal) ([place])   . Obstructive sleep apnea of adult     uses cpap, pt does not know setting  . Personal history of fall 05/29/2013  . Fracture of femoral neck, right, closed 3114  . Long term (current) use of anticoagulants     PAF and embolic CVA  . Anemia, unspecified 07/05/2013  . Xerophthalmia 07/05/2013  . 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  Actinic keratosis   . Seborrheic keratosis   . Aortic valve stenosis   . Cerebrovascular disease   . Elevated liver enzymes   . Diverticulosis   . Herpes zoster 02/08/14    Left facial nerve distribution  . Right hemiparesis ([place])   . Melanoma ([place]) 2009  . Hyperglycemia   . Xerophthalmia    Past Surgical History  Procedure Laterality Date  . Excision of melanoma  2009    left leg  . Melanoma excision      many melanoma removed in past  . Melanoma excision  10/11/2011    Procedure: MELANOMA EXCISION;  Surgeon: Pedro Earls, MD;  Location: [facility];  Service: General;  Laterality: Left;  EXCISION melanoma left leg with full  thickness skin grafting from left lower abdomen.  . Joint replacement  2012    r fem head fx  . Lung benign area removed   1970's  . Hernia repair  1983  . Melanoma excision  05/16/2012    Procedure: MELANOMA EXCISION;  Surgeon: Pedro Earls, MD;  [facility];  Service: General;  Laterality: Left;  Nodule Removal of Melanoma on Left Shin  . Ercp N/A 07/04/2013    Procedure: ENDOSCOPIC RETROGRADE CHOLANGIOPANCREATOGRAPHY (ERCP);  Surgeon: Irene Shipper, MD;  Location[facility];  Service: Endoscopy;  Laterality: N/A;    Allergies  Allergen Reactions  . Sulfonamide Derivatives Rash    REACTION: rash      Medication List       This list is accurate as of: 06/11/16 12:21 PM.  Always use your most recent med list.               acetaminophen 325 MG tablet  Commonly known as:  TYLENOL  Take 650 mg by mouth every 4 (four) hours as needed.     apixaban 5 MG Tabs tablet  Commonly known as:  ELIQUIS  Take 1 tablet (5 mg total) by mouth 2 (two) times daily.     bisacodyl 10 MG suppository  Commonly known as:  DULCOLAX  Place 10 mg rectally every 12 (twelve) hours as needed for moderate constipation.     carbamide peroxide 6.5 % otic solution  Commonly known as:  DEBROX  5 drops. 5 drops to each ear at bedtime on the 21st of each month     metoprolol tartrate 25 MG tablet  Commonly known as:  LOPRESSOR  Take 25 mg by mouth 2 (two) times daily.     MIRALAX powder  Generic drug:  polyethylene glycol powder  Take 1 Container by mouth once. Mix 17 gr of 4 oz of water take every other day     SYSTANE 0.4-0.3 % Soln  Generic drug:  Polyethyl Glycol-Propyl Glycol  Apply to eye. One drop both eye four times daily     SYSTANE NIGHTTIME Oint  1 application. Apply to right eye at bedtime     triamcinolone cream 0.1 %  Commonly known as:  KENALOG  Apply 1 application topically. Apply to area of itching on left and right scapula daily as needed        Review of Systems    Constitutional: Negative for fever and chills.  HENT: Positive for facial swelling and hearing loss. Negative for congestion, ear discharge, ear pain and nosebleeds.   Eyes: Positive for photophobia. Negative for pain, discharge and redness.  Respiratory: Negative for cough, shortness of breath and wheezing.   Cardiovascular: Positive for leg swelling. Negative  for chest pain and palpitations.       Trace  Gastrointestinal: Positive for constipation. Negative for nausea, vomiting and abdominal pain.  Genitourinary: Positive for frequency. Negative for dysuria and urgency.  Musculoskeletal: Negative for myalgias, back pain and neck pain.       Flexion contractures of the right elbow and right wrist.  Skin: Negative for rash.       Dry scaly skin. Top of left head skin lesion 2x2x1cm rough surface  Allergic/Immunologic: Negative for environmental allergies.  Neurological: Negative for dizziness, tremors, seizures, weakness and headaches.       Patient reporting increased weakness of the right side. History of right hemiplegia.  Hematological: Bruises/bleeds easily.  Psychiatric/Behavioral: Negative for suicidal ideas and hallucinations. The patient is nervous/anxious.     Immunization History  Administered Date(s) Administered  . Influenza Whole 10/06/2010, 08/07/2011, 09/20/2012  . Influenza-Unspecified 09/05/2013, 10/09/2014, 08/26/2015  . PPD Test 08/13/2013  . Pneumococcal Polysaccharide-23 05/06/1997  . Td 03/30/2005  . Tdap 05/29/2013   Pertinent  Health Maintenance Due  Topic Date Due  . PNA vac Low Risk Adult (2 of 2 - PCV13) 05/06/1998  . INFLUENZA VACCINE  07/06/2016   No flowsheet data found. Functional Status Survey:    Filed Vitals:   06/11/16 1212  BP: 140/70  Pulse: 68  Temp: 97.7 F (36.5 C)  Resp: 28  Height: 5\' 3"  (1.6 m)  Weight: 161 lb (73.029 kg)   Body mass index is 28.53 kg/(m^2). Physical Exam  Constitutional: He is oriented to person, place,  and time. He appears well-developed and well-nourished. No distress.  HENT:  Head: Normocephalic and atraumatic.  Right Ear: External ear normal.  Left Ear: External ear normal.  Nose: Nose normal.  Mouth/Throat: No oropharyngeal exudate.  Eyes: Conjunctivae are normal. Pupils are equal, round, and reactive to light. Right eye exhibits no discharge. No scleral icterus.  Left eye blindness. The right lower eyelid ectropion.   Neck: Normal range of motion. Neck supple. No JVD present. No tracheal deviation present. No thyromegaly present.  Cardiovascular: Normal rate and normal heart sounds.  An irregular rhythm present.  Pulmonary/Chest: Effort normal and breath sounds normal. No respiratory distress. He has no wheezes. He has no rales. He exhibits no tenderness.  Abdominal: He exhibits no distension. There is no tenderness.  Musculoskeletal: Normal range of motion. He exhibits edema. He exhibits no tenderness.  The right sided weakness since previous CVA-muscle strength is 3-4/5. The right wrist and hand contractures-able to make fist and cannot extend fully. No longer ambulates with walker and w/c for mobility Trace pedal edema R>L Left sided weakness from the recent CVA-mild-grip strength 5/5  Neurological: He is alert and oriented to person, place, and time. He displays abnormal reflex. No cranial nerve deficit. Coordination normal.  the left sided decreased coordination-cannot transfer self independently and dropped his utensil, grip strength is 3-4/5. Hx of right sided weakness with R hand contracture. Left facial weakness.   Skin: Skin is warm and dry. No rash noted. He is not diaphoretic. No erythema. No pallor.  he right cholecystostomy surgical scar. The medial left lower leg surgical scar from previous melanoma removal. Top of left head skin lesion 2x2x1cm rough surface, inflamed base.   Psychiatric: Thought content normal. His mood appears anxious. His affect is not angry and not  inappropriate. His speech is not rapid and/or pressured, not delayed and not slurred. He is agitated. He is not aggressive, not hyperactive, not slowed and not  withdrawn. Cognition and memory are impaired. He expresses impulsivity and inappropriate judgment. He does not exhibit a depressed mood. He exhibits abnormal recent memory. He exhibits normal remote memory.    Labs reviewed:  Recent Labs  09/30/15 03/16/16  NA 136* 139  K 5.0 5.1  BUN 21 27*  CREATININE 1.1 1.2    Recent Labs  09/30/15 03/16/16  AST 14 48*  ALT 9* 15  ALKPHOS 71 79    Recent Labs  09/30/15 03/16/16  WBC 7.3 8.6  HGB 12.0* 12.0*  HCT 37* 37*  PLT 267 246   Lab Results  Component Value Date   TSH 2.01 03/16/2016   Lab Results  Component Value Date   HGBA1C 5.9 03/16/2016   Lab Results  Component Value Date   CHOL 129 02/03/2014   HDL 45 02/03/2014   LDLCALC 64 02/03/2014   LDLDIRECT 139.4 04/26/2007   TRIG 99 02/03/2014   CHOLHDL 2.9 02/03/2014    Significant Diagnostic Results in last 30 days:  No results found.  Assessment/Plan 1. Weakness of right side of body I think this is consistent with previous examinations, however, it is possible that patient had another small stroke contributing to an increased weakness on the right side as he is currently complaining of. -We requested repeat evaluation by physical therapy due to the decreased functional ability on the right side per patient's history.  2. Essential hypertension Controlled  3. Dysphagia Stable to improved

## 2016-06-14 NOTE — Progress Notes (Signed)
This encounter was created in error - please disregard.

## 2016-07-23 ENCOUNTER — Non-Acute Institutional Stay (SKILLED_NURSING_FACILITY): Payer: Medicare Other | Admitting: Nurse Practitioner

## 2016-07-23 ENCOUNTER — Encounter: Payer: Self-pay | Admitting: Nurse Practitioner

## 2016-07-23 DIAGNOSIS — K5900 Constipation, unspecified: Secondary | ICD-10-CM | POA: Diagnosis not present

## 2016-07-23 DIAGNOSIS — D638 Anemia in other chronic diseases classified elsewhere: Secondary | ICD-10-CM | POA: Diagnosis not present

## 2016-07-23 DIAGNOSIS — I69359 Hemiplegia and hemiparesis following cerebral infarction affecting unspecified side: Secondary | ICD-10-CM

## 2016-07-23 DIAGNOSIS — Z7901 Long term (current) use of anticoagulants: Secondary | ICD-10-CM

## 2016-07-23 DIAGNOSIS — R609 Edema, unspecified: Secondary | ICD-10-CM

## 2016-07-23 DIAGNOSIS — I481 Persistent atrial fibrillation: Secondary | ICD-10-CM

## 2016-07-23 DIAGNOSIS — I10 Essential (primary) hypertension: Secondary | ICD-10-CM | POA: Diagnosis not present

## 2016-07-23 DIAGNOSIS — K219 Gastro-esophageal reflux disease without esophagitis: Secondary | ICD-10-CM | POA: Diagnosis not present

## 2016-07-23 DIAGNOSIS — I4819 Other persistent atrial fibrillation: Secondary | ICD-10-CM

## 2016-07-23 NOTE — Assessment & Plan Note (Signed)
Stable, continue Biscolax 10mg  suppository q12h prn, MiraLax daily.

## 2016-07-23 NOTE — Assessment & Plan Note (Signed)
Atrial fibrillation remained rate controlled. Patient was placed on Eliquis 5mg bid per neurology's recommendations. Continue Metoprolol 25mg bid 

## 2016-07-23 NOTE — Assessment & Plan Note (Signed)
Stable. off Omeprazole 20mg-refusal. 

## 2016-07-23 NOTE — Assessment & Plan Note (Signed)
Right sided weakness with right wrist/hand contracture. Grip strength is still 5/5. Ambulates with walker and w/c to go further. New onset of decreased the left sided coordination 01/2014. C/o worsened in his right sided weakness and stiffness. Continue anticoagulation therapy. 

## 2016-07-23 NOTE — Progress Notes (Signed)
Location:  [facility] Room Number: 8 Place of Service:  SNF (31) Provider:  Marlana Latus  NP    Patient Care Team: Tessi Eustache Otho Darner, NP as PCP - General (Internal Medicine) Kathrynn Ducking, MD as Consulting Physician (Neurology) Fanny Skates, MD as Consulting Physician (General Surgery) Irene Shipper, MD as Consulting Physician (Gastroenterology) Thayer Headings, MD as Consulting Physician (Cardiology) Tanda Rockers, MD as Consulting Physician (Pulmonary Disease)  Extended Emergency Contact Information Primary Emergency Contact: Schaffert,Williams (Rod) Address: [address]          [place], [place] [address] [place] Phone: [phone] Relation: Son  Code Status:  DNR Goals of care: Advanced Directive information Advanced Directives 07/23/2016  Does patient have an advance directive? Yes  Type of Advance Directive Living will;Out of facility DNR (pink MOST or yellow form)  Does patient want to make changes to advanced directive? No - Patient declined  Copy of advanced directive(s) in chart? Yes  Pre-existing out of facility DNR order (yellow form or pink MOST form) -     Chief Complaint  Patient presents with  . Medical Management of Chronic Issues    HPI:  Pt is a 82 y.o. male seen today for medical management of chronic diseases.      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   Past Medical History:  Diagnosis Date  . Abnormality of gait 05/16/2013  . Actinic keratosis   . Anemia, unspecified 07/05/2013  . Aortic valve stenosis   . 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 Bradycardia   . Bruises easily    due to coumadin  . Cancer ([place])   . Cerebrovascular disease   . Choledocholithiasis with obstruction 06/28/13  . Contact lens/glasses fitting   . COPD (chronic obstructive pulmonary disease) ([place])   .  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 Diverticulosis   . Diverticulosis   . Dysphagia   . Elevated liver enzymes   . Embolic stroke ([place]) [id number]  . Fracture of femoral neck, right, closed 3114  . Gait disturbance   . GERD (gastroesophageal reflux disease)   . Hearing loss    wears hearing aid - right side  . Herpes zoster 02/08/14   Left facial nerve distribution  . History of melanoma   . Hyperglycemia   . Hyperlipidemia   . Hypertension   . Leg swelling   . Long term (current) use of anticoagulants    PAF and embolic CVA  . Melanoma ([place]) 2009  . Obstructive sleep apnea of adult    uses cpap, pt does not know setting  . PAF (paroxysmal atrial fibrillation) ([place])   . Peripheral vascular disease ([place])   . Personal history of fall 05/29/2013  . Protein-calorie malnutrition, severe ([place])   . Right hemiparesis ([place])   . Seborrheic keratosis   . Stroke [facility]) 1999, 02/02/14  . Ventricular tachycardia (paroxysmal) ([place])   . Weakness    difficulty walking  . Xerophthalmia 07/05/2013  . Xerophthalmia    Past Surgical History:  Procedure Laterality Date  . ERCP N/A 07/04/2013   Procedure: ENDOSCOPIC RETROGRADE CHOLANGIOPANCREATOGRAPHY (ERCP);  Surgeon: Irene Shipper, MD;  Location[facility];  Service: Endoscopy;  Laterality: N/A;  . excision of melanoma  2009   left leg  . HERNIA REPAIR  1983  . JOINT REPLACEMENT  2012  r fem head fx  . lung benign area removed   1970's  . MELANOMA EXCISION     many melanoma removed in past  . MELANOMA EXCISION  10/11/2011   Procedure: MELANOMA EXCISION;  Surgeon: Pedro Earls, MD;  Location: [facility];  Service: General;  Laterality: Left;  EXCISION melanoma left leg with full thickness skin grafting from left lower abdomen.  Marland Kitchen MELANOMA EXCISION  05/16/2012   Procedure: MELANOMA EXCISION;  Surgeon: Pedro Earls, MD;  [facility];  Service: General;  Laterality: Left;  Nodule Removal of Melanoma on Left Shin    Allergies  Allergen Reactions    . Sulfonamide Derivatives Rash    REACTION: rash      Medication List       Accurate as of 07/23/16  2:57 PM. Always use your most recent med list.          acetaminophen 325 MG tablet Commonly known as:  TYLENOL Take 650 mg by mouth every 4 (four) hours as needed.   apixaban 5 MG Tabs tablet Commonly known as:  ELIQUIS Take 1 tablet (5 mg total) by mouth 2 (two) times daily.   bisacodyl 10 MG suppository Commonly known as:  DULCOLAX Place 10 mg rectally every 12 (twelve) hours as needed for moderate constipation.   carbamide peroxide 6.5 % otic solution Commonly known as:  DEBROX 5 drops. 5 drops to each ear at bedtime on the 21st of each month   metoprolol tartrate 25 MG tablet Commonly known as:  LOPRESSOR Take 25 mg by mouth 2 (two) times daily.   MIRALAX powder Generic drug:  polyethylene glycol powder Take 1 Container by mouth once. Mix 17 gr of 4 oz of water take every other day   SYSTANE 0.4-0.3 % Soln Generic drug:  Polyethyl Glycol-Propyl Glycol Apply to eye. One drop both eye four times daily   triamcinolone cream 0.1 % Commonly known as:  KENALOG Apply 1 application topically. Apply to area of itching on left and right scapula daily as needed       Review of Systems  Constitutional: Negative for chills and fever.  HENT: Positive for facial swelling and hearing loss. Negative for congestion, ear discharge, ear pain and nosebleeds.   Eyes: Positive for photophobia. Negative for pain, discharge and redness.  Respiratory: Negative for cough, shortness of breath and wheezing.   Cardiovascular: Positive for leg swelling. Negative for chest pain and palpitations.       Trace  Gastrointestinal: Positive for constipation. Negative for abdominal pain, nausea and vomiting.  Genitourinary: Positive for frequency. Negative for dysuria and urgency.  Musculoskeletal: Negative for back pain, myalgias and neck pain.       Flexion contractures of the right elbow  and right wrist.  Skin: Negative for rash.       Dry scaly skin. Top of left head skin lesion 2x2x1cm rough surface.   Allergic/Immunologic: Negative for environmental allergies.  Neurological: Negative for dizziness, tremors, seizures, weakness and headaches.       Patient reporting increased weakness of the right side. History of right hemiplegia.  Hematological: Bruises/bleeds easily.  Psychiatric/Behavioral: Negative for hallucinations and suicidal ideas. The patient is nervous/anxious.     Immunization History  Administered Date(s) Administered  . Influenza Whole 10/06/2010, 08/07/2011, 09/20/2012  . Influenza-Unspecified 09/05/2013, 10/09/2014, 08/26/2015  . PPD Test 08/13/2013  . Pneumococcal Polysaccharide-23 05/06/1997  . Td 03/30/2005  . Tdap 05/29/2013   Pertinent  Health Maintenance Due  Topic  Date Due  . PNA vac Low Risk Adult (2 of 2 - PCV13) 05/06/1998  . INFLUENZA VACCINE  07/06/2016   No flowsheet data found. Functional Status Survey:    Vitals:   07/23/16 1057  BP: 116/68  Pulse: 60  Resp: 16  Temp: 97.6 F (36.4 C)  Weight: 159 lb 6.4 oz (72.3 kg)  Height: 5\' 3"  (1.6 m)   Body mass index is 28.24 kg/m. Physical Exam  Constitutional: He is oriented to person, place, and time. He appears well-developed and well-nourished. No distress.  HENT:  Head: Normocephalic and atraumatic.  Right Ear: External ear normal.  Left Ear: External ear normal.  Nose: Nose normal.  Mouth/Throat: No oropharyngeal exudate.  Eyes: Conjunctivae are normal. Pupils are equal, round, and reactive to light. Right eye exhibits no discharge. No scleral icterus.  Left eye blindness. The right lower eyelid ectropion.   Neck: Normal range of motion. Neck supple. No JVD present. No tracheal deviation present. No thyromegaly present.  Cardiovascular: Normal rate and normal heart sounds.  An irregular rhythm present.  Pulmonary/Chest: Effort normal and breath sounds normal. No  respiratory distress. He has no wheezes. He has no rales. He exhibits no tenderness.  Abdominal: He exhibits no distension. There is no tenderness.  Musculoskeletal: Normal range of motion. He exhibits edema. He exhibits no tenderness.  The right sided weakness since previous CVA-muscle strength is 3-4/5. The right wrist and hand contractures-able to make fist and cannot extend fully. No longer ambulates with walker and w/c for mobility Trace pedal edema R>L Left sided weakness from the recent CVA-mild-grip strength 5/5  Neurological: He is alert and oriented to person, place, and time. He displays abnormal reflex. No cranial nerve deficit. Coordination normal.  the left sided decreased coordination-cannot transfer self independently and dropped his utensil, grip strength is 3-4/5. Hx of right sided weakness with R hand contracture. Left facial weakness.   Skin: Skin is warm and dry. No rash noted. He is not diaphoretic. No erythema. No pallor.  he right cholecystostomy surgical scar. The medial left lower leg surgical scar from previous melanoma removal. Top of left head skin lesion 2x2x1cm rough surface.  Psychiatric: Thought content normal. His mood appears anxious. His affect is not angry and not inappropriate. His speech is not rapid and/or pressured, not delayed and not slurred. He is agitated. He is not aggressive, not hyperactive, not slowed and not withdrawn. Cognition and memory are impaired. He expresses impulsivity and inappropriate judgment. He does not exhibit a depressed mood. He exhibits abnormal recent memory. He exhibits normal remote memory.    Labs reviewed:  Recent Labs  09/30/15 03/16/16  NA 136* 139  K 5.0 5.1  BUN 21 27*  CREATININE 1.1 1.2    Recent Labs  09/30/15 03/16/16  AST 14 48*  ALT 9* 15  ALKPHOS 71 79    Recent Labs  09/30/15 03/16/16  WBC 7.3 8.6  HGB 12.0* 12.0*  HCT 37* 37*  PLT 267 246   Lab Results  Component Value Date   TSH 2.01 03/16/2016    Lab Results  Component Value Date   HGBA1C 5.9 03/16/2016   Lab Results  Component Value Date   CHOL 129 02/03/2014   HDL 45 02/03/2014   LDLCALC 64 02/03/2014   LDLDIRECT 139.4 04/26/2007   TRIG 99 02/03/2014   CHOLHDL 2.9 02/03/2014    Significant Diagnostic Results in last 30 days:  No results found.  Assessment/Plan There are no diagnoses  linked to this encounter.Essential hypertension Controlled, continue Metoprolol 25mg  bid    Atrial fibrillation (HCC) Atrial fibrillation remained rate controlled. Patient was placed on Eliquis 5mg  bid per neurology's recommendations. Continue Metoprolol 25mg  bid   GERD Stable. off Omeprazole 20mg -refusal.   Constipation Stable, continue Biscolax 10mg  suppository q12h prn, MiraLax daily.     Hemiparesis as late effect of cerebrovascular accident (CVA) ([place]) Right sided weakness with right wrist/hand contracture. Grip strength is still 5/5. Ambulates with walker and w/c to go further. New onset of decreased the left sided coordination 01/2014. C/o worsened in his right sided weakness and stiffness. Continue anticoagulation therapy    Long term current use of anticoagulant therapy Eliquis since the past CVA 01/2014   Edema On and off BLE    Anemia of chronic disease 03/16/16 Hgb 12.0      Family/ staff Communication: continue SNF for care assistance.    Labs/tests ordered:  none

## 2016-07-23 NOTE — Assessment & Plan Note (Signed)
Controlled, continue Metoprolol 25mg bid 

## 2016-07-23 NOTE — Assessment & Plan Note (Signed)
03/16/16 Hgb 12.0

## 2016-07-23 NOTE — Assessment & Plan Note (Signed)
Eliquis since the past CVA 01/2014  

## 2016-07-23 NOTE — Assessment & Plan Note (Signed)
On and off BLE

## 2016-07-26 ENCOUNTER — Encounter: Payer: Self-pay | Admitting: Nurse Practitioner

## 2016-07-26 ENCOUNTER — Non-Acute Institutional Stay (SKILLED_NURSING_FACILITY): Payer: Medicare Other | Admitting: Nurse Practitioner

## 2016-07-26 DIAGNOSIS — I69359 Hemiplegia and hemiparesis following cerebral infarction affecting unspecified side: Secondary | ICD-10-CM | POA: Diagnosis not present

## 2016-07-26 DIAGNOSIS — I10 Essential (primary) hypertension: Secondary | ICD-10-CM

## 2016-07-26 DIAGNOSIS — R609 Edema, unspecified: Secondary | ICD-10-CM | POA: Diagnosis not present

## 2016-07-26 DIAGNOSIS — D649 Anemia, unspecified: Secondary | ICD-10-CM | POA: Diagnosis not present

## 2016-07-26 DIAGNOSIS — I481 Persistent atrial fibrillation: Secondary | ICD-10-CM

## 2016-07-26 DIAGNOSIS — K5900 Constipation, unspecified: Secondary | ICD-10-CM | POA: Diagnosis not present

## 2016-07-26 DIAGNOSIS — H109 Unspecified conjunctivitis: Secondary | ICD-10-CM

## 2016-07-26 DIAGNOSIS — I4819 Other persistent atrial fibrillation: Secondary | ICD-10-CM

## 2016-07-26 DIAGNOSIS — Z7901 Long term (current) use of anticoagulants: Secondary | ICD-10-CM

## 2016-07-26 DIAGNOSIS — K219 Gastro-esophageal reflux disease without esophagitis: Secondary | ICD-10-CM | POA: Diagnosis not present

## 2016-07-26 NOTE — Assessment & Plan Note (Signed)
Atrial fibrillation remained rate controlled. Patient was placed on Eliquis 5mg bid per neurology's recommendations. Continue Metoprolol 25mg bid 

## 2016-07-26 NOTE — Assessment & Plan Note (Signed)
On and off BLE

## 2016-07-26 NOTE — Assessment & Plan Note (Signed)
Stable, continue Biscolax 10mg  suppository q12h prn, MiraLax daily.

## 2016-07-26 NOTE — Progress Notes (Signed)
Location:   [facility] Room Number: 30 Place of Service:  SNF (31) Provider: Jachob Mcclean, Manxie  NP  Jeanmarie Hubert, MD  Patient Care Team: Estill Dooms, MD as PCP - General (Internal Medicine) Kathrynn Ducking, MD as Consulting Physician (Neurology) Fanny Skates, MD as Consulting Physician (General Surgery) Irene Shipper, MD as Consulting Physician (Gastroenterology) Thayer Headings, MD as Consulting Physician (Cardiology) Tanda Rockers, MD as Consulting Physician (Pulmonary Disease) Rodolfo Gaster Otho Darner, NP as Nurse Practitioner (Internal Medicine)  Extended Emergency Contact Information Primary Emergency Contact: Abdelrahman,Williams (Rod) Address: [address]          [place], [place] [address] [place] Phone: [phone] Relation: Son  Code Status:  DNR Goals of care: Advanced Directive information Advanced Directives 07/26/2016  Does patient have an advance directive? Yes  Type of Advance Directive Living will;Out of facility DNR (pink MOST or yellow form)  Does patient want to make changes to advanced directive? No - Patient declined  Copy of advanced directive(s) in chart? Yes  Pre-existing out of facility DNR order (yellow form or pink MOST form) -     Chief Complaint  Patient presents with  . Acute Visit    Right eye redness    HPI:  Pt is a 82 y.o. male seen today for an acute visit for the right eye mild irritation, erythema, watery, artificial tears was discontinued 07/05/16. Denied vision change or pain associated with the above symptoms.     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   Past Medical History:  Diagnosis Date  . Abnormality of gait 05/16/2013  . Actinic keratosis   . Anemia, unspecified 07/05/2013  . Aortic valve stenosis   . 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 Bradycardia   . Bruises easily      due to coumadin  . Cancer ([place])   . Cerebrovascular disease   . Choledocholithiasis with obstruction 06/28/13  . Contact lens/glasses fitting   . COPD (chronic obstructive pulmonary disease) ([place])   . 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 Diverticulosis   . Diverticulosis   . Dysphagia   . Elevated liver enzymes   . Embolic stroke ([place]) [id number]  . Fracture of femoral neck, right, closed 3114  . Gait disturbance   . GERD (gastroesophageal reflux disease)   . Hearing loss    wears hearing aid - right side  . Herpes zoster 02/08/14   Left facial nerve distribution  . History of melanoma   . Hyperglycemia   . Hyperlipidemia   . Hypertension   . Leg swelling   . Long term (current) use of anticoagulants    PAF and embolic CVA  . Melanoma ([place]) 2009  . Obstructive sleep apnea of adult    uses cpap, pt does not know setting  . PAF (paroxysmal atrial fibrillation) ([place])   . Peripheral vascular disease ([place])   . Personal history of fall 05/29/2013  . Protein-calorie malnutrition, severe ([place])   . Right hemiparesis ([place])   . Seborrheic keratosis   . Stroke [facility]) 1999, 02/02/14  . Ventricular tachycardia (paroxysmal) ([place])   . Weakness    difficulty walking  . Xerophthalmia 07/05/2013  . Xerophthalmia    Past Surgical History:  Procedure Laterality Date  . ERCP N/A 07/04/2013   Procedure: ENDOSCOPIC RETROGRADE CHOLANGIOPANCREATOGRAPHY (ERCP);  Surgeon: Docia Chuck  Henrene Pastor, MD;  Location: [facility];  Service: Endoscopy;  Laterality: N/A;  . excision of melanoma  2009   left leg  . HERNIA REPAIR  1983  . JOINT REPLACEMENT  2012   r fem head fx  . lung benign area removed   1970's  . MELANOMA EXCISION     many melanoma removed in past  . MELANOMA EXCISION  10/11/2011   Procedure: MELANOMA EXCISION;  Surgeon: Pedro Earls, MD;  Location: [facility];  Service: General;  Laterality: Left;  EXCISION melanoma left leg with full thickness skin grafting from left lower abdomen.  Marland Kitchen MELANOMA EXCISION   05/16/2012   Procedure: MELANOMA EXCISION;  Surgeon: Pedro Earls, MD;  [facility]: [facility];  Service: General;  Laterality: Left;  Nodule Removal of Melanoma on Left Shin    Allergies  Allergen Reactions  . Sulfonamide Derivatives Rash    REACTION: rash      Medication List       Accurate as of 07/26/16  4:27 PM. Always use your most recent med list.          acetaminophen 325 MG tablet Commonly known as:  TYLENOL Take 650 mg by mouth every 4 (four) hours as needed.   apixaban 5 MG Tabs tablet Commonly known as:  ELIQUIS Take 1 tablet (5 mg total) by mouth 2 (two) times daily.   bisacodyl 10 MG suppository Commonly known as:  DULCOLAX Place 10 mg rectally every 12 (twelve) hours as needed for moderate constipation.   carbamide peroxide 6.5 % otic solution Commonly known as:  DEBROX 5 drops. 5 drops to each ear at bedtime on the 21st of each month   metoprolol tartrate 25 MG tablet Commonly known as:  LOPRESSOR Take 25 mg by mouth 2 (two) times daily.   MIRALAX powder Generic drug:  polyethylene glycol powder Take 1 Container by mouth once. Mix 17 gr of 4 oz of water take every other day   SYSTANE 0.4-0.3 % Soln Generic drug:  Polyethyl Glycol-Propyl Glycol Apply to eye. One drop both eye four times daily   triamcinolone cream 0.1 % Commonly known as:  KENALOG Apply 1 application topically. Apply to area of itching on left and right scapula daily as needed       Review of Systems  Constitutional: Negative for chills and fever.  HENT: Positive for facial swelling and hearing loss. Negative for congestion, ear discharge, ear pain and nosebleeds.   Eyes: Positive for photophobia, discharge, redness and itching. Negative for pain.       Injected watery right eye, mild itching and irritation reported.   Respiratory: Negative for cough, shortness of breath and wheezing.   Cardiovascular: Positive for leg swelling. Negative for chest pain and palpitations.        Trace  Gastrointestinal: Positive for constipation. Negative for abdominal pain, nausea and vomiting.  Genitourinary: Positive for frequency. Negative for dysuria and urgency.  Musculoskeletal: Negative for back pain, myalgias and neck pain.       Flexion contractures of the right elbow and right wrist.  Skin: Negative for rash.       Dry scaly skin. Top of left head skin lesion 2x2x1cm rough surface.   Allergic/Immunologic: Negative for environmental allergies.  Neurological: Negative for dizziness, tremors, seizures, weakness and headaches.       Patient reporting increased weakness of the right side. History of right hemiplegia.  Hematological: Bruises/bleeds easily.  Psychiatric/Behavioral: Negative for hallucinations and suicidal ideas. The  patient is nervous/anxious.     Immunization History  Administered Date(s) Administered  . Influenza Whole 10/06/2010, 08/07/2011, 09/20/2012  . Influenza-Unspecified 09/05/2013, 10/09/2014, 08/26/2015  . PPD Test 08/13/2013  . Pneumococcal Polysaccharide-23 05/06/1997  . Td 03/30/2005  . Tdap 05/29/2013   Pertinent  Health Maintenance Due  Topic Date Due  . PNA vac Low Risk Adult (2 of 2 - PCV13) 05/06/1998  . INFLUENZA VACCINE  07/06/2016   No flowsheet data found. Functional Status Survey:    Vitals:   07/26/16 1432  BP: 124/80  Pulse: (!) 50  Resp: 16  Temp: 97.5 F (36.4 C)  Weight: 159 lb 6.4 oz (72.3 kg)  Height: 5\' 3"  (1.6 m)   Body mass index is 28.24 kg/m. Physical Exam  Constitutional: He is oriented to person, place, and time. He appears well-developed and well-nourished. No distress.  HENT:  Head: Normocephalic and atraumatic.  Right Ear: External ear normal.  Left Ear: External ear normal.  Nose: Nose normal.  Mouth/Throat: No oropharyngeal exudate.  Eyes: Pupils are equal, round, and reactive to light. Right eye exhibits discharge. No scleral icterus.  Left eye blindness. The right lower eyelid ectropion.  Injected watery right eye, mild itching and irritation reported.  Neck: Normal range of motion. Neck supple. No JVD present. No tracheal deviation present. No thyromegaly present.  Cardiovascular: Normal rate and normal heart sounds.  An irregular rhythm present.  Pulmonary/Chest: Effort normal and breath sounds normal. No respiratory distress. He has no wheezes. He has no rales. He exhibits no tenderness.  Abdominal: He exhibits no distension. There is no tenderness.  Musculoskeletal: Normal range of motion. He exhibits edema. He exhibits no tenderness.  The right sided weakness since previous CVA-muscle strength is 3-4/5. The right wrist and hand contractures-able to make fist and cannot extend fully. No longer ambulates with walker and w/c for mobility Trace pedal edema R>L Left sided weakness from the recent CVA-mild-grip strength 5/5  Neurological: He is alert and oriented to person, place, and time. He displays abnormal reflex. No cranial nerve deficit. Coordination normal.  the left sided decreased coordination-cannot transfer self independently and dropped his utensil, grip strength is 3-4/5. Hx of right sided weakness with R hand contracture. Left facial weakness.   Skin: Skin is warm and dry. No rash noted. He is not diaphoretic. No erythema. No pallor.  he right cholecystostomy surgical scar. The medial left lower leg surgical scar from previous melanoma removal. Top of left head skin lesion 2x2x1cm rough surface.  Psychiatric: Thought content normal. His mood appears anxious. His affect is not angry and not inappropriate. His speech is not rapid and/or pressured, not delayed and not slurred. He is agitated. He is not aggressive, not hyperactive, not slowed and not withdrawn. Cognition and memory are impaired. He expresses impulsivity and inappropriate judgment. He does not exhibit a depressed mood. He exhibits abnormal recent memory. He exhibits normal remote memory.    Labs  reviewed:  Recent Labs  09/30/15 03/16/16  NA 136* 139  K 5.0 5.1  BUN 21 27*  CREATININE 1.1 1.2    Recent Labs  09/30/15 03/16/16  AST 14 48*  ALT 9* 15  ALKPHOS 71 79    Recent Labs  09/30/15 03/16/16  WBC 7.3 8.6  HGB 12.0* 12.0*  HCT 37* 37*  PLT 267 246   Lab Results  Component Value Date   TSH 2.01 03/16/2016   Lab Results  Component Value Date   HGBA1C 5.9 03/16/2016  Lab Results  Component Value Date   CHOL 129 02/03/2014   HDL 45 02/03/2014   LDLCALC 64 02/03/2014   LDLDIRECT 139.4 04/26/2007   TRIG 99 02/03/2014   CHOLHDL 2.9 02/03/2014    Significant Diagnostic Results in last 30 days:  No results found.  Assessment/Plan There are no diagnoses linked to this encounter.Conjunctivitis The right eye, watery, injected, reported mild irritation, Naphcon A I gtt bid x 2 weeks, artificial tears I gtt tid  Essential hypertension Controlled, continue Metoprolol 25mg  bid    Atrial fibrillation (HCC) Atrial fibrillation remained rate controlled. Patient was placed on Eliquis 5mg  bid per neurology's recommendations. Continue Metoprolol 25mg  bid    GERD Stable. off Omeprazole 20mg -refusal.    Constipation Stable, continue Biscolax 10mg  suppository q12h prn, MiraLax daily.    Hemiparesis as late effect of cerebrovascular accident (CVA) ([place]) Right sided weakness with right wrist/hand contracture. Grip strength is still 5/5. Ambulates with walker and w/c to go further. New onset of decreased the left sided coordination 01/2014. C/o worsened in his right sided weakness and stiffness. Continue anticoagulation therapy   Long term current use of anticoagulant therapy Eliquis since the past CVA 01/2014   Edema On and off BLE    Anemia, unspecified 03/16/16 Hgb 12.0      Family/ staff Communication: continue SNF for care assistance.   Labs/tests ordered: none

## 2016-07-26 NOTE — Assessment & Plan Note (Signed)
The right eye, watery, injected, reported mild irritation, Naphcon A I gtt bid x 2 weeks, artificial tears I gtt tid

## 2016-07-26 NOTE — Assessment & Plan Note (Signed)
Right sided weakness with right wrist/hand contracture. Grip strength is still 5/5. Ambulates with walker and w/c to go further. New onset of decreased the left sided coordination 01/2014. C/o worsened in his right sided weakness and stiffness. Continue anticoagulation therapy. 

## 2016-07-26 NOTE — Assessment & Plan Note (Signed)
03/16/16 Hgb 12.0

## 2016-07-26 NOTE — Assessment & Plan Note (Signed)
Eliquis since the past CVA 01/2014  

## 2016-07-26 NOTE — Assessment & Plan Note (Signed)
Controlled, continue Metoprolol 25mg bid 

## 2016-07-26 NOTE — Assessment & Plan Note (Signed)
Stable. off Omeprazole 20mg-refusal. 

## 2016-08-27 ENCOUNTER — Encounter: Payer: Self-pay | Admitting: Nurse Practitioner

## 2016-08-27 ENCOUNTER — Non-Acute Institutional Stay (SKILLED_NURSING_FACILITY): Payer: Medicare Other | Admitting: Nurse Practitioner

## 2016-08-27 DIAGNOSIS — I69359 Hemiplegia and hemiparesis following cerebral infarction affecting unspecified side: Secondary | ICD-10-CM | POA: Diagnosis not present

## 2016-08-27 DIAGNOSIS — K219 Gastro-esophageal reflux disease without esophagitis: Secondary | ICD-10-CM

## 2016-08-27 DIAGNOSIS — I4819 Other persistent atrial fibrillation: Secondary | ICD-10-CM

## 2016-08-27 DIAGNOSIS — D638 Anemia in other chronic diseases classified elsewhere: Secondary | ICD-10-CM

## 2016-08-27 DIAGNOSIS — Z7901 Long term (current) use of anticoagulants: Secondary | ICD-10-CM

## 2016-08-27 DIAGNOSIS — H109 Unspecified conjunctivitis: Secondary | ICD-10-CM | POA: Diagnosis not present

## 2016-08-27 DIAGNOSIS — I10 Essential (primary) hypertension: Secondary | ICD-10-CM

## 2016-08-27 DIAGNOSIS — I481 Persistent atrial fibrillation: Secondary | ICD-10-CM | POA: Diagnosis not present

## 2016-08-27 DIAGNOSIS — K5900 Constipation, unspecified: Secondary | ICD-10-CM

## 2016-08-27 DIAGNOSIS — R609 Edema, unspecified: Secondary | ICD-10-CM

## 2016-08-27 NOTE — Assessment & Plan Note (Signed)
Stable. off Omeprazole 20mg-refusal. 

## 2016-08-27 NOTE — Assessment & Plan Note (Signed)
03/16/16 Hgb 12.0

## 2016-08-27 NOTE — Assessment & Plan Note (Signed)
Eliquis since the past CVA 01/2014  

## 2016-08-27 NOTE — Assessment & Plan Note (Signed)
Controlled, continue Metoprolol 25mg bid 

## 2016-08-27 NOTE — Assessment & Plan Note (Signed)
Stable, continue Biscolax 10mg  suppository q12h prn, MiraLax daily.

## 2016-08-27 NOTE — Assessment & Plan Note (Signed)
Atrial fibrillation remained rate controlled. Patient was placed on Eliquis 5mg bid per neurology's recommendations. Continue Metoprolol 25mg bid 

## 2016-08-27 NOTE — Progress Notes (Signed)
Location:  [facility] Room Number: 67 Place of Service:  SNF (31) Provider:  Gatlin Kittell, Manxie  NP  Jeanmarie Hubert, MD  Patient Care Team: Estill Dooms, MD as PCP - General (Internal Medicine) Kathrynn Ducking, MD as Consulting Physician (Neurology) Fanny Skates, MD as Consulting Physician (General Surgery) Irene Shipper, MD as Consulting Physician (Gastroenterology) Thayer Headings, MD as Consulting Physician (Cardiology) Tanda Rockers, MD as Consulting Physician (Pulmonary Disease) Cris Talavera Otho Darner, NP as Nurse Practitioner (Internal Medicine)  Extended Emergency Contact Information Primary Emergency Contact: Stinson,Williams (Rod) Address: [address]          [place], [place] [address] [place] Phone: [phone] Relation: Son  Code Status:  DNR Goals of care: Advanced Directive information Advanced Directives 08/27/2016  Does patient have an advance directive? -  Type of Advance Directive Out of facility DNR (pink MOST or yellow form);Living will  Does patient want to make changes to advanced directive? No - Patient declined  Copy of advanced directive(s) in chart? Yes  Pre-existing out of facility DNR order (yellow form or pink MOST form) -     Chief Complaint  Patient presents with  . Medical Management of Chronic Issues    HPI:  Pt is a 82 y.o. male seen today for medical management of chronic diseases.      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  Past Medical History:  Diagnosis Date  . Abnormality of gait 05/16/2013  . Actinic keratosis   . Anemia, unspecified 07/05/2013  . Aortic valve stenosis   . 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 Bradycardia   . Bruises easily    due to coumadin  . Cancer ([place])   . Cerebrovascular disease   . Choledocholithiasis with obstruction 06/28/13  . Contact  lens/glasses fitting   . COPD (chronic obstructive pulmonary disease) ([place])   . 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 Diverticulosis   . Diverticulosis   . Dysphagia   . Elevated liver enzymes   . Embolic stroke ([place]) [id number]  . Fracture of femoral neck, right, closed 3114  . Gait disturbance   . GERD (gastroesophageal reflux disease)   . Hearing loss    wears hearing aid - right side  . Herpes zoster 02/08/14   Left facial nerve distribution  . History of melanoma   . Hyperglycemia   . Hyperlipidemia   . Hypertension   . Leg swelling   . Long term (current) use of anticoagulants    PAF and embolic CVA  . Melanoma ([place]) 2009  . Obstructive sleep apnea of adult    uses cpap, pt does not know setting  . PAF (paroxysmal atrial fibrillation) ([place])   . Peripheral vascular disease ([place])   . Personal history of fall 05/29/2013  . Protein-calorie malnutrition, severe ([place])   . Right hemiparesis ([place])   . Seborrheic keratosis   . Stroke [facility]) 1999, 02/02/14  . Ventricular tachycardia (paroxysmal) ([place])   . Weakness    difficulty walking  . Xerophthalmia 07/05/2013  . Xerophthalmia    Past Surgical History:  Procedure Laterality Date  . ERCP N/A 07/04/2013   Procedure: ENDOSCOPIC RETROGRADE CHOLANGIOPANCREATOGRAPHY (ERCP);  Surgeon: Irene Shipper, MD;  [facility];  Service: Endoscopy;  Laterality: N/A;  . excision of melanoma  2009   left leg  .  HERNIA REPAIR  1983  . JOINT REPLACEMENT  2012   r fem head fx  . lung benign area removed   1970's  . MELANOMA EXCISION     many melanoma removed in past  . MELANOMA EXCISION  10/11/2011   Procedure: MELANOMA EXCISION;  Surgeon: Pedro Earls, MD;  Location: [facility];  Service: General;  Laterality: Left;  EXCISION melanoma left leg with full thickness skin grafting from left lower abdomen.  Marland Kitchen MELANOMA EXCISION  05/16/2012   Procedure: MELANOMA EXCISION;  Surgeon: Pedro Earls, MD;  [facility];  Service: General;  Laterality:  Left;  Nodule Removal of Melanoma on Left Shin    Allergies  Allergen Reactions  . Sulfonamide Derivatives Rash    REACTION: rash      Medication List       Accurate as of 08/27/16  1:43 PM. Always use your most recent med list.          acetaminophen 325 MG tablet Commonly known as:  TYLENOL Take 650 mg by mouth every 4 (four) hours as needed.   apixaban 5 MG Tabs tablet Commonly known as:  ELIQUIS Take 1 tablet (5 mg total) by mouth 2 (two) times daily.   bisacodyl 10 MG suppository Commonly known as:  DULCOLAX Place 10 mg rectally every 12 (twelve) hours as needed for moderate constipation.   carbamide peroxide 6.5 % otic solution Commonly known as:  DEBROX 5 drops. 5 drops to each ear at bedtime on the 21st of each month   metoprolol tartrate 25 MG tablet Commonly known as:  LOPRESSOR Take 25 mg by mouth 2 (two) times daily.   MIRALAX powder Generic drug:  polyethylene glycol powder Take 1 Container by mouth once. Mix 17 gr of 4 oz of water take every other day   SYSTANE 0.4-0.3 % Soln Generic drug:  Polyethyl Glycol-Propyl Glycol Apply to eye. One drop both eye four times daily   triamcinolone cream 0.1 % Commonly known as:  KENALOG Apply 1 application topically. Apply to area of itching on left and right scapula daily as needed       Review of Systems  Constitutional: Negative for chills and fever.  HENT: Positive for facial swelling and hearing loss. Negative for congestion, ear discharge, ear pain and nosebleeds.   Eyes: Negative for photophobia, pain, discharge, redness and itching.       Injected watery right eye, mild itching and irritation reported.   Respiratory: Negative for cough, shortness of breath and wheezing.   Cardiovascular: Positive for leg swelling. Negative for chest pain and palpitations.       Trace  Gastrointestinal: Positive for constipation. Negative for abdominal pain, nausea and vomiting.  Genitourinary: Positive for  frequency. Negative for dysuria and urgency.  Musculoskeletal: Negative for back pain, myalgias and neck pain.       Flexion contractures of the right elbow and right wrist.  Skin: Negative for rash.       Dry scaly skin. Top of left head skin lesion 2x2x1cm rough surface.   Allergic/Immunologic: Negative for environmental allergies.  Neurological: Negative for dizziness, tremors, seizures, weakness and headaches.       Patient reporting increased weakness of the right side. History of right hemiplegia.  Hematological: Bruises/bleeds easily.  Psychiatric/Behavioral: Negative for hallucinations and suicidal ideas. The patient is nervous/anxious.     Immunization History  Administered Date(s) Administered  . Influenza Whole 10/06/2010, 08/07/2011, 09/20/2012  . Influenza-Unspecified 09/05/2013, 10/09/2014, 08/26/2015  .  PPD Test 08/13/2013  . Pneumococcal Polysaccharide-23 05/06/1997  . Td 03/30/2005  . Tdap 05/29/2013   Pertinent  Health Maintenance Due  Topic Date Due  . PNA vac Low Risk Adult (2 of 2 - PCV13) 05/06/1998  . INFLUENZA VACCINE  07/06/2016   No flowsheet data found. Functional Status Survey:    Vitals:   08/27/16 1236  BP: 134/60  Pulse: (!) 54  Resp: 20  Temp: 98.4 F (36.9 C)  Weight: 159 lb 4.8 oz (72.3 kg)  Height: 5\' 3"  (1.6 m)   Body mass index is 28.22 kg/m. Physical Exam  Constitutional: He is oriented to person, place, and time. He appears well-developed and well-nourished. No distress.  HENT:  Head: Normocephalic and atraumatic.  Right Ear: External ear normal.  Left Ear: External ear normal.  Nose: Nose normal.  Mouth/Throat: No oropharyngeal exudate.  Eyes: Pupils are equal, round, and reactive to light. Right eye exhibits no discharge. No scleral icterus.  Left eye blindness. The right lower eyelid ectropion.   Neck: Normal range of motion. Neck supple. No JVD present. No tracheal deviation present. No thyromegaly present.    Cardiovascular: Normal rate and normal heart sounds.  An irregular rhythm present.  Pulmonary/Chest: Effort normal and breath sounds normal. No respiratory distress. He has no wheezes. He has no rales. He exhibits no tenderness.  Abdominal: He exhibits no distension. There is no tenderness.  Musculoskeletal: Normal range of motion. He exhibits edema. He exhibits no tenderness.  The right sided weakness since previous CVA-muscle strength is 3-4/5. The right wrist and hand contractures-able to make fist and cannot extend fully. No longer ambulates with walker and w/c for mobility Trace pedal edema R>L Left sided weakness from the recent CVA-mild-grip strength 5/5  Neurological: He is alert and oriented to person, place, and time. He displays abnormal reflex. No cranial nerve deficit. Coordination normal.  the left sided decreased coordination-cannot transfer self independently and dropped his utensil, grip strength is 3-4/5. Hx of right sided weakness with R hand contracture. Left facial weakness.   Skin: Skin is warm and dry. No rash noted. He is not diaphoretic. No erythema. No pallor.  he right cholecystostomy surgical scar. The medial left lower leg surgical scar from previous melanoma removal. Top of left head skin lesion 2x2x1cm rough surface.  Psychiatric: Thought content normal. His mood appears anxious. His affect is not angry and not inappropriate. His speech is not rapid and/or pressured, not delayed and not slurred. He is agitated. He is not aggressive, not hyperactive, not slowed and not withdrawn. Cognition and memory are impaired. He expresses impulsivity and inappropriate judgment. He does not exhibit a depressed mood. He exhibits abnormal recent memory. He exhibits normal remote memory.    Labs reviewed:  Recent Labs  09/30/15 03/16/16  NA 136* 139  K 5.0 5.1  BUN 21 27*  CREATININE 1.1 1.2    Recent Labs  09/30/15 03/16/16  AST 14 48*  ALT 9* 15  ALKPHOS 71 79    Recent  Labs  09/30/15 03/16/16  WBC 7.3 8.6  HGB 12.0* 12.0*  HCT 37* 37*  PLT 267 246   Lab Results  Component Value Date   TSH 2.01 03/16/2016   Lab Results  Component Value Date   HGBA1C 5.9 03/16/2016   Lab Results  Component Value Date   CHOL 129 02/03/2014   HDL 45 02/03/2014   LDLCALC 64 02/03/2014   LDLDIRECT 139.4 04/26/2007   TRIG 99 02/03/2014  CHOLHDL 2.9 02/03/2014    Significant Diagnostic Results in last 30 days:  No results found.  Assessment/Plan There are no diagnoses linked to this encounter.Essential hypertension Controlled, continue Metoprolol 25mg  bid   Atrial fibrillation (HCC) Atrial fibrillation remained rate controlled. Patient was placed on Eliquis 5mg  bid per neurology's recommendations. Continue Metoprolol 25mg  bid   GERD Stable. off Omeprazole 20mg -refusal.   Constipation Stable, continue Biscolax 10mg  suppository q12h prn, MiraLax daily.     Hemiparesis as late effect of cerebrovascular accident (CVA) ([place]) Right sided weakness with right wrist/hand contracture. Grip strength is still 5/5. Ambulates with walker and w/c to go further. New onset of decreased the left sided coordination 01/2014. C/o worsened in his right sided weakness and stiffness. Continue anticoagulation therapy   Long term current use of anticoagulant therapy Eliquis since the past CVA 01/2014  Edema On and off BLE   Anemia of chronic disease 03/16/16 Hgb 12.0  Conjunctivitis Resolved. The right eye, watery, injected, reported mild irritation, Naphcon A I gtt bid x 2 weeks, artificial tears I gtt tid     Family/ staff Communication: continue SNF for care assistance.    Labs/tests ordered:  none

## 2016-08-27 NOTE — Assessment & Plan Note (Signed)
Resolved. The right eye, watery, injected, reported mild irritation, Naphcon A I gtt bid x 2 weeks, artificial tears I gtt tid

## 2016-08-27 NOTE — Assessment & Plan Note (Signed)
Right sided weakness with right wrist/hand contracture. Grip strength is still 5/5. Ambulates with walker and w/c to go further. New onset of decreased the left sided coordination 01/2014. C/o worsened in his right sided weakness and stiffness. Continue anticoagulation therapy. 

## 2016-08-27 NOTE — Assessment & Plan Note (Signed)
On and off BLE

## 2016-09-20 ENCOUNTER — Non-Acute Institutional Stay (SKILLED_NURSING_FACILITY): Payer: Medicare Other | Admitting: Internal Medicine

## 2016-09-20 ENCOUNTER — Encounter: Payer: Self-pay | Admitting: Internal Medicine

## 2016-09-20 DIAGNOSIS — D649 Anemia, unspecified: Secondary | ICD-10-CM

## 2016-09-20 DIAGNOSIS — I481 Persistent atrial fibrillation: Secondary | ICD-10-CM

## 2016-09-20 DIAGNOSIS — I10 Essential (primary) hypertension: Secondary | ICD-10-CM

## 2016-09-20 DIAGNOSIS — I4819 Other persistent atrial fibrillation: Secondary | ICD-10-CM

## 2016-09-20 NOTE — Progress Notes (Signed)
Progress Note     Location:  [facility] Room Number: [place] of Service:  SNF [phone]) Provider:  Jeanmarie Hubert, MD  Patient Care Team: Estill Dooms, MD as PCP - General (Internal Medicine) Kathrynn Ducking, MD as Consulting Physician (Neurology) Fanny Skates, MD as Consulting Physician (General Surgery) Irene Shipper, MD as Consulting Physician (Gastroenterology) Thayer Headings, MD as Consulting Physician (Cardiology) Tanda Rockers, MD as Consulting Physician (Pulmonary Disease) Man Otho Darner, NP as Nurse Practitioner (Internal Medicine)  Extended Emergency Contact Information Primary Emergency Contact: Aki,Williams (Rod) Address: [address]          [place], [place] [address] [place] Phone: [phone] Relation: Son  Code Status:   Goals of care: Advanced Directive information Advanced Directives 09/20/2016  Does patient have an advance directive? Yes  Type of Advance Directive Living will  Does patient want to make changes to advanced directive? -  Copy of advanced directive(s) in chart? Yes  Pre-existing out of facility DNR order (yellow form or pink MOST form) Yellow form placed in chart (order not valid for inpatient use)     Chief Complaint  Patient presents with  . Medical Management of Chronic Issues    routine visit    HPI:  Pt is a 82 y.o. male seen today for medical management of chronic diseases.    Persistent atrial fibrillation (HCC) - rate controlled and anticoagulated  Anemia, unspecified type - Last checked 6 mo ago and stable then with Hgb 12.0  Essential hypertension - controlled     Past Medical History:  Diagnosis Date  . Abnormality of gait 05/16/2013  . Actinic keratosis   . Anemia, unspecified 07/05/2013  . Aortic valve stenosis   . 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 Bradycardia   . Bruises easily    due to coumadin  . Cancer ([place])   . Cerebrovascular disease   .  Choledocholithiasis with obstruction 06/28/13  . Contact lens/glasses fitting   . COPD (chronic obstructive pulmonary disease) ([place])   . 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 Diverticulosis   . Diverticulosis   . Dysphagia   . Elevated liver enzymes   . Embolic stroke ([place]) [id number]  . Fracture of femoral neck, right, closed ([place]) 3114  . Gait disturbance   . GERD (gastroesophageal reflux disease)   . Hearing loss    wears hearing aid - right side  . Herpes zoster 02/08/14   Left facial nerve distribution  . History of melanoma   . Hyperglycemia   . Hyperlipidemia   . Hypertension   . Leg swelling   . Long term (current) use of anticoagulants    PAF and embolic CVA  . Melanoma ([place]) 2009  . Obstructive sleep apnea of adult    uses cpap, pt does not know setting  . PAF (paroxysmal atrial fibrillation) ([place])   . Peripheral vascular disease ([place])   . Personal history of fall 05/29/2013  . Protein-calorie malnutrition, severe ([place])   . Right hemiparesis ([place])   . Seborrheic keratosis   . Stroke [facility]) 1999, 02/02/14  . Ventricular tachycardia (paroxysmal) ([place])   . Weakness    difficulty walking  . Xerophthalmia 07/05/2013  . Xerophthalmia    Past Surgical History:  Procedure Laterality Date  . ERCP N/A 07/04/2013   Procedure: ENDOSCOPIC RETROGRADE CHOLANGIOPANCREATOGRAPHY (ERCP);  Surgeon: Irene Shipper, MD;  Location: [facility];  Service: Endoscopy;  Laterality: N/A;  .  excision of melanoma  2009   left leg  . HERNIA REPAIR  1983  . JOINT REPLACEMENT  2012   r fem head fx  . lung benign area removed   1970's  . MELANOMA EXCISION     many melanoma removed in past  . MELANOMA EXCISION  10/11/2011   Procedure: MELANOMA EXCISION;  Surgeon: Pedro Earls, MD;  Location: [facility];  Service: General;  Laterality: Left;  EXCISION melanoma left leg with full thickness skin grafting from left lower abdomen.  Marland Kitchen MELANOMA EXCISION  05/16/2012   Procedure: MELANOMA EXCISION;  Surgeon: Pedro Earls, MD;  Location: [facility];  Service: General;  Laterality: Left;  Nodule Removal of Melanoma on Left Shin    Allergies  Allergen Reactions  . Sulfonamide Derivatives Rash    REACTION: rash      Medication List       Accurate as of 09/20/16  1:44 PM. Always use your most recent med list.          acetaminophen 325 MG tablet Commonly known as:  TYLENOL Take 650 mg by mouth every 4 (four) hours as needed.   apixaban 5 MG Tabs tablet Commonly known as:  ELIQUIS Take 1 tablet (5 mg total) by mouth 2 (two) times daily.   bisacodyl 10 MG suppository Commonly known as:  DULCOLAX Place 10 mg rectally every 12 (twelve) hours as needed for moderate constipation.   carbamide peroxide 6.5 % otic solution Commonly known as:  DEBROX 5 drops. 5 drops to each ear at bedtime on the 21st of each month   metoprolol tartrate 25 MG tablet Commonly known as:  LOPRESSOR Take 25 mg by mouth 2 (two) times daily.   MIRALAX powder Generic drug:  polyethylene glycol powder Take 1 Container by mouth once. Mix 17 gr of 4 oz of water take every other day   SYSTANE 0.4-0.3 % Soln Generic drug:  Polyethyl Glycol-Propyl Glycol Apply to eye. One drop both eye four times daily   triamcinolone cream 0.1 % Commonly known as:  KENALOG Apply 1 application topically. Apply to area of itching on left and right scapula daily as needed       Review of Systems  Constitutional: Negative for chills and fever.  HENT: Positive for facial swelling and hearing loss. Negative for congestion, ear discharge, ear pain and nosebleeds.   Eyes: Negative for photophobia, pain, discharge, redness and itching.       Injected watery right eye, mild itching and irritation reported.   Respiratory: Negative for cough, shortness of breath and wheezing.   Cardiovascular: Positive for leg swelling. Negative for chest pain and palpitations.       Trace  Gastrointestinal: Positive for constipation. Negative for abdominal  pain, nausea and vomiting.  Genitourinary: Positive for frequency. Negative for dysuria and urgency.  Musculoskeletal: Negative for back pain, myalgias and neck pain.       Flexion contractures of the right elbow and right wrist.  Skin: Negative for rash.       Dry scaly skin. Top of left head skin lesion 2x2x1cm rough surface.   Allergic/Immunologic: Negative for environmental allergies.  Neurological: Negative for dizziness, tremors, seizures, weakness and headaches.       Patient reporting increased weakness of the right side. History of right hemiplegia.  Hematological: Bruises/bleeds easily.       Hx anemia  Psychiatric/Behavioral: Negative for hallucinations and suicidal ideas. The patient is nervous/anxious.     Immunization  History  Administered Date(s) Administered  . Influenza Whole 10/06/2010, 08/07/2011, 09/20/2012  . Influenza-Unspecified 09/05/2013, 10/09/2014, 08/26/2015  . PPD Test 08/13/2013  . Pneumococcal Polysaccharide-23 05/06/1997  . Td 03/30/2005  . Tdap 05/29/2013   Pertinent  Health Maintenance Due  Topic Date Due  . PNA vac Low Risk Adult (2 of 2 - PCV13) 05/06/1998  . INFLUENZA VACCINE  07/06/2016   No flowsheet data found. Functional Status Survey:    Vitals:   09/20/16 1336  Weight: 158 lb (71.7 kg)  Height: 5\' 3"  (1.6 m)   Body mass index is 27.99 kg/m. Physical Exam  Constitutional: He is oriented to person, place, and time. He appears well-developed and well-nourished. No distress.  HENT:  Head: Normocephalic and atraumatic.  Right Ear: External ear normal.  Left Ear: External ear normal.  Nose: Nose normal.  Mouth/Throat: No oropharyngeal exudate.  Eyes: Pupils are equal, round, and reactive to light. Right eye exhibits no discharge. No scleral icterus.  Left eye blindness. The right lower eyelid ectropion.   Neck: Normal range of motion. Neck supple. No JVD present. No tracheal deviation present. No thyromegaly present.    Cardiovascular: Normal rate and normal heart sounds.  An irregular rhythm present.  Pulmonary/Chest: Effort normal and breath sounds normal. No respiratory distress. He has no wheezes. He has no rales. He exhibits no tenderness.  Abdominal: He exhibits no distension. There is no tenderness.  Musculoskeletal: Normal range of motion. He exhibits edema. He exhibits no tenderness.  The right sided weakness since previous CVA-muscle strength is 3-4/5. The right wrist and hand contractures-able to make fist and cannot extend fully. No longer ambulates with walker and w/c for mobility Trace pedal edema R>L Left sided weakness from the recent CVA-mild-grip strength 5/5  Neurological: He is alert and oriented to person, place, and time. He displays abnormal reflex. No cranial nerve deficit. Coordination normal.  the left sided decreased coordination-cannot transfer self independently and dropped his utensil, grip strength is 3-4/5. Hx of right sided weakness with R hand contracture. Left facial weakness.   Skin: Skin is warm and dry. No rash noted. He is not diaphoretic. No erythema. No pallor.  he right cholecystostomy surgical scar. The medial left lower leg surgical scar from previous melanoma removal. Top of left head skin lesion 2x2x1cm rough surface.  Psychiatric: Thought content normal. His mood appears anxious. His affect is not angry and not inappropriate. His speech is not rapid and/or pressured, not delayed and not slurred. He is agitated. He is not aggressive, not hyperactive, not slowed and not withdrawn. Cognition and memory are impaired. He expresses impulsivity and inappropriate judgment. He does not exhibit a depressed mood. He exhibits abnormal recent memory. He exhibits normal remote memory.    Labs reviewed:  Recent Labs  09/30/15 03/16/16  NA 136* 139  K 5.0 5.1  BUN 21 27*  CREATININE 1.1 1.2    Recent Labs  09/30/15 03/16/16  AST 14 48*  ALT 9* 15  ALKPHOS 71 79    Recent  Labs  09/30/15 03/16/16  WBC 7.3 8.6  HGB 12.0* 12.0*  HCT 37* 37*  PLT 267 246   Lab Results  Component Value Date   TSH 2.01 03/16/2016   Lab Results  Component Value Date   HGBA1C 5.9 03/16/2016   Lab Results  Component Value Date   CHOL 129 02/03/2014   HDL 45 02/03/2014   LDLCALC 64 02/03/2014   LDLDIRECT 139.4 04/26/2007   TRIG 99  02/03/2014   CHOLHDL 2.9 02/03/2014    Significant Diagnostic Results in last 30 days:  No results found.  Assessment/Plan Persistent atrial fibrillation (HCC) - rate controlled and anticoaglulated  Anemia, unspecified type - cbc  Essential hypertension  - controlled

## 2016-09-27 ENCOUNTER — Non-Acute Institutional Stay (SKILLED_NURSING_FACILITY): Payer: Medicare Other | Admitting: Nurse Practitioner

## 2016-09-27 ENCOUNTER — Encounter: Payer: Self-pay | Admitting: Nurse Practitioner

## 2016-09-27 DIAGNOSIS — H01003 Unspecified blepharitis right eye, unspecified eyelid: Secondary | ICD-10-CM | POA: Diagnosis not present

## 2016-09-27 DIAGNOSIS — I10 Essential (primary) hypertension: Secondary | ICD-10-CM

## 2016-09-27 DIAGNOSIS — D500 Iron deficiency anemia secondary to blood loss (chronic): Secondary | ICD-10-CM

## 2016-09-27 DIAGNOSIS — M24531 Contracture, right wrist: Secondary | ICD-10-CM

## 2016-09-27 DIAGNOSIS — H10401 Unspecified chronic conjunctivitis, right eye: Secondary | ICD-10-CM | POA: Diagnosis not present

## 2016-09-27 DIAGNOSIS — R609 Edema, unspecified: Secondary | ICD-10-CM

## 2016-09-27 DIAGNOSIS — K219 Gastro-esophageal reflux disease without esophagitis: Secondary | ICD-10-CM

## 2016-09-27 DIAGNOSIS — I69359 Hemiplegia and hemiparesis following cerebral infarction affecting unspecified side: Secondary | ICD-10-CM

## 2016-09-27 DIAGNOSIS — K5900 Constipation, unspecified: Secondary | ICD-10-CM

## 2016-09-27 DIAGNOSIS — H01006 Unspecified blepharitis left eye, unspecified eyelid: Secondary | ICD-10-CM | POA: Diagnosis not present

## 2016-09-27 DIAGNOSIS — I480 Paroxysmal atrial fibrillation: Secondary | ICD-10-CM | POA: Diagnosis not present

## 2016-09-27 NOTE — Assessment & Plan Note (Signed)
Stable, continue Biscolax 10mg  suppository q12h prn, MiraLax daily.

## 2016-09-27 NOTE — Assessment & Plan Note (Signed)
Controlled, continue Metoprolol 25mg bid 

## 2016-09-27 NOTE — Progress Notes (Signed)
Location:  [facility] Room Number: 35 Place of Service:  SNF (31) Provider:  Marlina Cataldi, Manxie  NP  Jeanmarie Hubert, MD  Patient Care Team: Estill Dooms, MD as PCP - General (Internal Medicine) Kathrynn Ducking, MD as Consulting Physician (Neurology) Fanny Skates, MD as Consulting Physician (General Surgery) Irene Shipper, MD as Consulting Physician (Gastroenterology) Thayer Headings, MD as Consulting Physician (Cardiology) Tanda Rockers, MD as Consulting Physician (Pulmonary Disease) [place] Otho Darner, NP as Nurse Practitioner (Internal Medicine)  Extended Emergency Contact Information Primary Emergency Contact: Thurman,Williams (Rod) Address: [address]          [place], [place] [address] [place] Phone: [phone] Relation: Son  Code Status:  DNR Goals of care: Advanced Directive information Advanced Directives 09/27/2016  Does patient have an advance directive? Yes  Type of Advance Directive Living will  Does patient want to make changes to advanced directive? No - Patient declined  Copy of advanced directive(s) in chart? Yes  Pre-existing out of facility DNR order (yellow form or pink MOST form) -     Chief Complaint  Patient presents with  . Acute Visit    Eye redness    HPI:  Pt is a 82 y.o. male seen today for an acute visit for irritated eyes, crusted eye lashes, denied pain or itching in eyes, taking artificial tears routinely.    Hx of late effect of CVAs with R+L sided weakness, R>L, right hand contracture, w/c for mobility, heart rate controlled A-fib, taking Eliquis for thromboembolic risk reduction, normalized HTN while on Metoprolol.    Past Medical History:  Diagnosis Date  . Abnormality of gait 05/16/2013  . Actinic keratosis   . Anemia, unspecified 07/05/2013  . Aortic valve stenosis   . 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 Bradycardia   . Bruises easily    due to coumadin  . Cancer ([place])   . Cerebrovascular  disease   . Choledocholithiasis with obstruction 06/28/13  . Contact lens/glasses fitting   . COPD (chronic obstructive pulmonary disease) ([place])   . 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 Diverticulosis   . Diverticulosis   . Dysphagia   . Elevated liver enzymes   . Embolic stroke ([place]) [id number]  . Fracture of femoral neck, right, closed ([place]) 3114  . Gait disturbance   . GERD (gastroesophageal reflux disease)   . Hearing loss    wears hearing aid - right side  . Herpes zoster 02/08/14   Left facial nerve distribution  . History of melanoma   . Hyperglycemia   . Hyperlipidemia   . Hypertension   . Leg swelling   . Long term (current) use of anticoagulants    PAF and embolic CVA  . Melanoma ([place]) 2009  . Obstructive sleep apnea of adult    uses cpap, pt does not know setting  . PAF (paroxysmal atrial fibrillation) ([place])   . Peripheral vascular disease ([place])   . Personal history of fall 05/29/2013  . Protein-calorie malnutrition, severe ([place])   . Right hemiparesis ([place])   . Seborrheic keratosis   . Stroke [facility]) 1999, 02/02/14  . Ventricular tachycardia (paroxysmal) ([place])   . Weakness    difficulty walking  . Xerophthalmia 07/05/2013  . Xerophthalmia    Past Surgical History:  Procedure Laterality Date  . ERCP N/A 07/04/2013   Procedure: ENDOSCOPIC RETROGRADE CHOLANGIOPANCREATOGRAPHY (ERCP);  Surgeon: Irene Shipper, MD;  Location[facility];  Service: Endoscopy;  Laterality: N/A;  . excision of melanoma  2009   left leg  . HERNIA REPAIR  1983  . JOINT REPLACEMENT  2012   r fem head fx  . lung benign area removed   1970's  . MELANOMA EXCISION     many melanoma removed in past  . MELANOMA EXCISION  10/11/2011   Procedure: MELANOMA EXCISION;  Surgeon: Pedro Earls, MD;  Location: [facility];  Service: General;  Laterality: Left;  EXCISION melanoma left leg with full thickness skin grafting from left lower abdomen.  Marland Kitchen MELANOMA EXCISION  05/16/2012   Procedure: MELANOMA EXCISION;  Surgeon:  Pedro Earls, MD;  [facility];  Service: General;  Laterality: Left;  Nodule Removal of Melanoma on Left Shin    Allergies  Allergen Reactions  . Sulfonamide Derivatives Rash    REACTION: rash      Medication List       Accurate as of 09/27/16  2:25 PM. Always use your most recent med list.          acetaminophen 325 MG tablet Commonly known as:  TYLENOL Take 650 mg by mouth every 4 (four) hours as needed.   apixaban 5 MG Tabs tablet Commonly known as:  ELIQUIS Take 1 tablet (5 mg total) by mouth 2 (two) times daily.   bisacodyl 10 MG suppository Commonly known as:  DULCOLAX Place 10 mg rectally every 12 (twelve) hours as needed for moderate constipation.   carbamide peroxide 6.5 % otic solution Commonly known as:  DEBROX 5 drops. 5 drops to each ear at bedtime on the 21st of each month   metoprolol tartrate 25 MG tablet Commonly known as:  LOPRESSOR Take 25 mg by mouth 2 (two) times daily.   MIRALAX powder Generic drug:  polyethylene glycol powder Take 1 Container by mouth once. Mix 17 gr of 4 oz of water take every other day   SYSTANE 0.4-0.3 % Soln Generic drug:  Polyethyl Glycol-Propyl Glycol Apply to eye. One drop both eye four times daily   triamcinolone cream 0.1 % Commonly known as:  KENALOG Apply 1 application topically. Apply to area of itching on left and right scapula daily as needed       Review of Systems  Constitutional: Negative for chills and fever.  HENT: Positive for facial swelling and hearing loss. Negative for congestion, ear discharge, ear pain and nosebleeds.   Eyes: Negative for photophobia, pain, discharge, redness and itching.       Irritated conjunctivae R>L, crusted eye lashes   Respiratory: Negative for cough, shortness of breath and wheezing.   Cardiovascular: Positive for leg swelling. Negative for chest pain and palpitations.       Trace  Gastrointestinal: Positive for constipation. Negative for abdominal pain,  nausea and vomiting.  Genitourinary: Positive for frequency. Negative for dysuria and urgency.  Musculoskeletal: Negative for back pain, myalgias and neck pain.       Flexion contractures of the right elbow and right wrist.  Skin: Negative for rash.       Dry scaly skin. Top of left head skin lesion 2x2x1cm rough surface.   Allergic/Immunologic: Negative for environmental allergies.  Neurological: Negative for dizziness, tremors, seizures, weakness and headaches.       Patient reporting increased weakness of the right side. History of right hemiplegia.  Hematological: Bruises/bleeds easily.       Hx anemia  Psychiatric/Behavioral: Negative for hallucinations and suicidal ideas. The patient is nervous/anxious.  Immunization History  Administered Date(s) Administered  . Influenza Whole 10/06/2010, 08/07/2011, 09/20/2012  . Influenza-Unspecified 09/05/2013, 10/09/2014, 08/26/2015  . PPD Test 08/13/2013  . Pneumococcal Polysaccharide-23 05/06/1997  . Td 03/30/2005  . Tdap 05/29/2013   Pertinent  Health Maintenance Due  Topic Date Due  . PNA vac Low Risk Adult (2 of 2 - PCV13) 05/06/1998  . INFLUENZA VACCINE  07/06/2016   No flowsheet data found. Functional Status Survey:    Vitals:   09/27/16 1411  BP: 130/72  Pulse: (!) 52  Resp: 18  Temp: 98.3 F (36.8 C)  Weight: 158 lb (71.7 kg)  Height: 5\' 3"  (1.6 m)   Body mass index is 27.99 kg/m. Physical Exam  Constitutional: He is oriented to person, place, and time. He appears well-developed and well-nourished. No distress.  HENT:  Head: Normocephalic and atraumatic.  Right Ear: External ear normal.  Left Ear: External ear normal.  Nose: Nose normal.  Mouth/Throat: No oropharyngeal exudate.  Eyes: Pupils are equal, round, and reactive to light. Right eye exhibits no discharge. No scleral icterus.  Left eye blindness. The right lower eyelid ectropion.   Neck: Normal range of motion. Neck supple. No JVD present. No  tracheal deviation present. No thyromegaly present.  Cardiovascular: Normal rate and normal heart sounds.  An irregular rhythm present.  Pulmonary/Chest: Effort normal and breath sounds normal. No respiratory distress. He has no wheezes. He has no rales. He exhibits no tenderness.  Abdominal: He exhibits no distension. There is no tenderness.  Musculoskeletal: Normal range of motion. He exhibits edema. He exhibits no tenderness.  The right sided weakness since previous CVA-muscle strength is 3-4/5. The right wrist and hand contractures-able to make fist and cannot extend fully. No longer ambulates with walker and w/c for mobility Trace pedal edema R>L Left sided weakness from the recent CVA-mild-grip strength 5/5  Neurological: He is alert and oriented to person, place, and time. He displays abnormal reflex. No cranial nerve deficit. Coordination normal.  the left sided decreased coordination-cannot transfer self independently and dropped his utensil, grip strength is 3-4/5. Hx of right sided weakness with R hand contracture. Left facial weakness.   Skin: Skin is warm and dry. No rash noted. He is not diaphoretic. No erythema. No pallor.  he right cholecystostomy surgical scar. The medial left lower leg surgical scar from previous melanoma removal. Top of left head skin lesion 2x2x1cm rough surface.  Psychiatric: Thought content normal. His mood appears anxious. His affect is not angry and not inappropriate. His speech is not rapid and/or pressured, not delayed and not slurred. He is agitated. He is not aggressive, not hyperactive, not slowed and not withdrawn. Cognition and memory are impaired. He expresses impulsivity and inappropriate judgment. He does not exhibit a depressed mood. He exhibits abnormal recent memory. He exhibits normal remote memory.    Labs reviewed:  Recent Labs  09/30/15 03/16/16  NA 136* 139  K 5.0 5.1  BUN 21 27*  CREATININE 1.1 1.2    Recent Labs  09/30/15 03/16/16    AST 14 48*  ALT 9* 15  ALKPHOS 71 79    Recent Labs  09/30/15 03/16/16  WBC 7.3 8.6  HGB 12.0* 12.0*  HCT 37* 37*  PLT 267 246   Lab Results  Component Value Date   TSH 2.01 03/16/2016   Lab Results  Component Value Date   HGBA1C 5.9 03/16/2016   Lab Results  Component Value Date   CHOL 129 02/03/2014  HDL 45 02/03/2014   LDLCALC 64 02/03/2014   LDLDIRECT 139.4 04/26/2007   TRIG 99 02/03/2014   CHOLHDL 2.9 02/03/2014    Significant Diagnostic Results in last 30 days:  No results found.  Assessment/Plan Blepharitis of both eyes Apply 1/2 inch of 0.5% erythromycin ophthalmic oint to the R+L lower conjunctival sac nightly.   Conjunctivitis R>L, dry eyes and blepharitis contributory. Continue artifical tears and Erythromycin oint nightly.   Essential hypertension Controlled, continue Metoprolol 25mg  bid   Atrial fibrillation (HCC) Atrial fibrillation remained rate controlled. Patient was placed on Eliquis 5mg  bid per neurology's recommendations. Continue Metoprolol 25mg  bid   GERD Stable. off Omeprazole 20mg -refusal.    Constipation Stable, continue Biscolax 10mg  suppository q12h prn, MiraLax daily.      Hemiparesis as late effect of cerebrovascular accident (CVA) ([place]) Right sided weakness with right wrist/hand contracture. Grip strength is still 5/5. Ambulates with walker and w/c to go further. New onset of decreased the left sided coordination 01/2014. C/o worsened in his right sided weakness and stiffness. Continue anticoagulation therapy. May consider PT/OT or message therapy    Edema Trace to 1+, On and off BLE  Anemia 03/16/16 Hgb 12.0   Contracture of joint of forearm Right elbow and wrist, PT and OT to eval and tx as indicated.    Family/ staff Communication: continue SNF for care assistance.   Labs/tests ordered: none

## 2016-09-27 NOTE — Assessment & Plan Note (Signed)
Right elbow and wrist, PT and OT to eval and tx as indicated.

## 2016-09-27 NOTE — Assessment & Plan Note (Signed)
Trace to 1+, On and off BLE

## 2016-09-27 NOTE — Assessment & Plan Note (Signed)
Atrial fibrillation remained rate controlled. Patient was placed on Eliquis 5mg bid per neurology's recommendations. Continue Metoprolol 25mg bid 

## 2016-09-27 NOTE — Assessment & Plan Note (Signed)
Stable. off Omeprazole 20mg-refusal. 

## 2016-09-27 NOTE — Assessment & Plan Note (Signed)
Apply 1/2 inch of 0.5% erythromycin ophthalmic oint to the R+L lower conjunctival sac nightly.

## 2016-09-27 NOTE — Assessment & Plan Note (Addendum)
Right sided weakness with right wrist/hand contracture. Grip strength is still 5/5. Ambulates with walker and w/c to go further. New onset of decreased the left sided coordination 01/2014. C/o worsened in his right sided weakness and stiffness. Continue anticoagulation therapy. May consider PT/OT or message therapy

## 2016-09-27 NOTE — Assessment & Plan Note (Signed)
R>L, dry eyes and blepharitis contributory. Continue artifical tears and Erythromycin oint nightly.

## 2016-09-27 NOTE — Assessment & Plan Note (Signed)
03/16/16 Hgb 12.0

## 2016-09-28 LAB — CBC AND DIFFERENTIAL
HEMATOCRIT: 36 % — AB (ref 41–53)
Hemoglobin: 11.8 g/dL — AB (ref 13.5–17.5)
Platelets: 233 10*3/uL (ref 150–399)
WBC: 6.6 10*3/mL

## 2016-09-30 ENCOUNTER — Other Ambulatory Visit: Payer: Self-pay | Admitting: *Deleted

## 2016-10-21 ENCOUNTER — Encounter: Payer: Self-pay | Admitting: Nurse Practitioner

## 2016-10-21 ENCOUNTER — Non-Acute Institutional Stay (SKILLED_NURSING_FACILITY): Payer: Medicare Other | Admitting: Nurse Practitioner

## 2016-10-21 DIAGNOSIS — K5900 Constipation, unspecified: Secondary | ICD-10-CM | POA: Diagnosis not present

## 2016-10-21 DIAGNOSIS — I480 Paroxysmal atrial fibrillation: Secondary | ICD-10-CM | POA: Diagnosis not present

## 2016-10-21 DIAGNOSIS — K219 Gastro-esophageal reflux disease without esophagitis: Secondary | ICD-10-CM

## 2016-10-21 DIAGNOSIS — I69359 Hemiplegia and hemiparesis following cerebral infarction affecting unspecified side: Secondary | ICD-10-CM | POA: Diagnosis not present

## 2016-10-21 DIAGNOSIS — R609 Edema, unspecified: Secondary | ICD-10-CM | POA: Diagnosis not present

## 2016-10-21 DIAGNOSIS — I10 Essential (primary) hypertension: Secondary | ICD-10-CM

## 2016-10-21 DIAGNOSIS — H01001 Unspecified blepharitis right upper eyelid: Secondary | ICD-10-CM | POA: Diagnosis not present

## 2016-10-21 DIAGNOSIS — L03116 Cellulitis of left lower limb: Secondary | ICD-10-CM | POA: Insufficient documentation

## 2016-10-21 DIAGNOSIS — M19042 Primary osteoarthritis, left hand: Secondary | ICD-10-CM | POA: Diagnosis not present

## 2016-10-21 DIAGNOSIS — H01004 Unspecified blepharitis left upper eyelid: Secondary | ICD-10-CM

## 2016-10-21 DIAGNOSIS — D638 Anemia in other chronic diseases classified elsewhere: Secondary | ICD-10-CM

## 2016-10-21 NOTE — Assessment & Plan Note (Signed)
Atrial fibrillation remained rate controlled. Patient was placed on Eliquis 5mg bid per neurology's recommendations. Continue Metoprolol 25mg bid 

## 2016-10-21 NOTE — Assessment & Plan Note (Signed)
Trace to 1+, On and off BLE

## 2016-10-21 NOTE — Assessment & Plan Note (Signed)
Improved, dc Erythromycin ophthalmic oint.

## 2016-10-21 NOTE — Assessment & Plan Note (Signed)
Stable. off Omeprazole 20mg-refusal. 

## 2016-10-21 NOTE — Progress Notes (Signed)
Location:  [facility] Room Number: 40 Place of Service:  SNF (31) Provider:  Shaelin Lalley, Manxie  NP  Jeanmarie Hubert, MD  Patient Care Team: Estill Dooms, MD as PCP - General (Internal Medicine) Kathrynn Ducking, MD as Consulting Physician (Neurology) Fanny Skates, MD as Consulting Physician (General Surgery) Irene Shipper, MD as Consulting Physician (Gastroenterology) Thayer Headings, MD as Consulting Physician (Cardiology) Tanda Rockers, MD as Consulting Physician (Pulmonary Disease) Blakeley Margraf Otho Darner, NP as Nurse Practitioner (Internal Medicine)  Extended Emergency Contact Information Primary Emergency Contact: Chabot,Williams (Rod) Address: [address]          [place], [place] [address] [place] Phone: [phone] Relation: Son  Code Status:  DNR Goals of care: Advanced Directive information Advanced Directives 10/21/2016  Does patient have an advance directive? Yes  Type of Advance Directive Living will  Does patient want to make changes to advanced directive? No - Patient declined  Copy of advanced directive(s) in chart? Yes  Pre-existing out of facility DNR order (yellow form or pink MOST form) -     Chief Complaint  Patient presents with  . Acute Visit    Left leg red and warm to the touch per patient    HPI:  Pt is a 82 y.o. male seen today for an acute visit for warm and redness LLE, chronic venous insufficiency, chronic.    Hx of late effect of CVAs with R+L sided weakness, R>L, right hand contracture, w/c for mobility, heart rate controlled A-fib, taking Eliquis for thromboembolic risk reduction, normalized HTN while on Metoprolol.   Past Medical History:  Diagnosis Date  . Abnormality of gait 05/16/2013  . Actinic keratosis   . Anemia, unspecified 07/05/2013  . Aortic valve stenosis   . 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 Bradycardia   . Bruises easily    due to coumadin  . Cancer ([place])   . Cerebrovascular disease   .  Choledocholithiasis with obstruction 06/28/13  . Contact lens/glasses fitting   . COPD (chronic obstructive pulmonary disease) ([place])   . 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 Diverticulosis   . Diverticulosis   . Dysphagia   . Elevated liver enzymes   . Embolic stroke ([place]) [id number]  . Fracture of femoral neck, right, closed ([place]) 3114  . Gait disturbance   . GERD (gastroesophageal reflux disease)   . Hearing loss    wears hearing aid - right side  . Herpes zoster 02/08/14   Left facial nerve distribution  . History of melanoma   . Hyperglycemia   . Hyperlipidemia   . Hypertension   . Leg swelling   . Long term (current) use of anticoagulants    PAF and embolic CVA  . Melanoma ([place]) 2009  . Obstructive sleep apnea of adult    uses cpap, pt does not know setting  . PAF (paroxysmal atrial fibrillation) ([place])   . Peripheral vascular disease ([place])   . Personal history of fall 05/29/2013  . Protein-calorie malnutrition, severe ([place])   . Right hemiparesis ([place])   . Seborrheic keratosis   . Stroke [facility]) 1999, 02/02/14  . Ventricular tachycardia (paroxysmal) ([place])   . Weakness    difficulty walking  . Xerophthalmia 07/05/2013  . Xerophthalmia    Past Surgical History:  Procedure Laterality Date  . ERCP N/A 07/04/2013   Procedure: ENDOSCOPIC RETROGRADE CHOLANGIOPANCREATOGRAPHY (ERCP);  Surgeon: Irene Shipper, MD;  Location: [facility];  Service: Endoscopy;  Laterality: N/A;  . excision of melanoma  2009   left leg  . HERNIA REPAIR  1983  . JOINT REPLACEMENT  2012   r fem head fx  . lung benign area removed   1970's  . MELANOMA EXCISION     many melanoma removed in past  . MELANOMA EXCISION  10/11/2011   Procedure: MELANOMA EXCISION;  Surgeon: [place] Earls, MD;  Location: [facility];  Service: General;  Laterality: Left;  EXCISION melanoma left leg with full thickness skin grafting from left lower abdomen.  Marland Kitchen MELANOMA EXCISION  05/16/2012   Procedure: MELANOMA EXCISION;  Surgeon: [place] Earls, MD;  Location: [facility];  Service: General;  Laterality: Left;  Nodule Removal of Melanoma on Left Shin    Allergies  Allergen Reactions  . Sulfonamide Derivatives Rash    REACTION: rash      Medication List       Accurate as of 10/21/16 12:20 PM. Always use your most recent med list.          acetaminophen 325 MG tablet Commonly known as:  TYLENOL Take 650 mg by mouth every 4 (four) hours as needed.   apixaban 5 MG Tabs tablet Commonly known as:  ELIQUIS Take 1 tablet (5 mg total) by mouth 2 (two) times daily.   bisacodyl 10 MG suppository Commonly known as:  DULCOLAX Place 10 mg rectally every 12 (twelve) hours as needed for moderate constipation.   carbamide peroxide 6.5 % otic solution Commonly known as:  DEBROX 5 drops. 5 drops to each ear at bedtime on the 21st of each month   metoprolol tartrate 25 MG tablet Commonly known as:  LOPRESSOR Take 25 mg by mouth 2 (two) times daily.   MIRALAX powder Generic drug:  polyethylene glycol powder Take 1 Container by mouth once. Mix 17 gr of 4 oz of water take every other day   SYSTANE 0.4-0.3 % Soln Generic drug:  Polyethyl Glycol-Propyl Glycol Apply to eye. One drop both eye four times daily   triamcinolone cream 0.1 % Commonly known as:  KENALOG Apply 1 application topically. Apply to area of itching on left and right scapula daily as needed       Review of Systems  Constitutional: Negative for chills and fever.  HENT: Positive for facial swelling and hearing loss. Negative for congestion, ear discharge, ear pain and nosebleeds.   Eyes: Negative for photophobia, pain, discharge, redness and itching.  Respiratory: Negative for cough, shortness of breath and wheezing.   Cardiovascular: Positive for leg swelling. Negative for chest pain and palpitations.       Trace  Gastrointestinal: Positive for constipation. Negative for abdominal pain, nausea and vomiting.  Genitourinary: Positive for frequency.  Negative for dysuria and urgency.  Musculoskeletal: Negative for back pain, myalgias and neck pain.       Flexion contractures of the right elbow and right wrist.  Skin: Negative for rash.       Dry scaly skin. Top of left head skin lesion 2x2x1cm rough surface.   Allergic/Immunologic: Negative for environmental allergies.  Neurological: Negative for dizziness, tremors, seizures, weakness and headaches.       Patient reporting increased weakness of the right side. History of right hemiplegia.  Hematological: Bruises/bleeds easily.       Hx anemia  Psychiatric/Behavioral: Negative for hallucinations and suicidal ideas. The patient is nervous/anxious.     Immunization History  Administered Date(s) Administered  . Influenza Whole 10/06/2010, 08/07/2011, 09/20/2012  .  Influenza-Unspecified 09/05/2013, 10/09/2014, 08/26/2015, 09/21/2016  . PPD Test 08/13/2013  . Pneumococcal Polysaccharide-23 05/06/1997  . Td 03/30/2005  . Tdap 05/29/2013   Pertinent  Health Maintenance Due  Topic Date Due  . PNA vac Low Risk Adult (2 of 2 - PCV13) 05/06/1998  . INFLUENZA VACCINE  Completed   No flowsheet data found. Functional Status Survey:    Vitals:   10/21/16 1037  BP: 114/67  Pulse: 60  Resp: (!) 22  Temp: 97.1 F (36.2 C)  Weight: 160 lb (72.6 kg)  Height: 5\' 3"  (1.6 m)   Body mass index is 28.34 kg/m. Physical Exam  Constitutional: He is oriented to person, place, and time. He appears well-developed and well-nourished. No distress.  HENT:  Head: Normocephalic and atraumatic.  Right Ear: External ear normal.  Left Ear: External ear normal.  Nose: Nose normal.  Mouth/Throat: No oropharyngeal exudate.  Eyes: Pupils are equal, round, and reactive to light. Right eye exhibits no discharge. No scleral icterus.  Left eye blindness. The right lower eyelid ectropion.   Neck: Normal range of motion. Neck supple. No JVD present. No tracheal deviation present. No thyromegaly present.    Cardiovascular: Normal rate and normal heart sounds.  An irregular rhythm present.  Pulmonary/Chest: Effort normal and breath sounds normal. No respiratory distress. He has no wheezes. He has no rales. He exhibits no tenderness.  Abdominal: He exhibits no distension. There is no tenderness.  Musculoskeletal: Normal range of motion. He exhibits edema. He exhibits no tenderness.  The right sided weakness since previous CVA-muscle strength is 3-4/5. The right wrist and hand contractures-able to make fist and cannot extend fully. No longer ambulates with walker and w/c for mobility Trace pedal edema R>L Left sided weakness from the recent CVA-mild-grip strength 5/5  Neurological: He is alert and oriented to person, place, and time. He displays abnormal reflex. No cranial nerve deficit. Coordination normal.  the left sided decreased coordination-cannot transfer self independently and dropped his utensil, grip strength is 3-4/5. Hx of right sided weakness with R hand contracture. Left facial weakness.   Skin: Skin is warm and dry. No rash noted. He is not diaphoretic. There is erythema. No pallor.  he right cholecystostomy surgical scar. The medial left lower leg surgical scar from previous melanoma removal. Top of left head skin lesion 2x2x1cm rough surface. Warm and redness LLE  Psychiatric: Thought content normal. His mood appears anxious. His affect is not angry and not inappropriate. His speech is not rapid and/or pressured, not delayed and not slurred. He is agitated. He is not aggressive, not hyperactive, not slowed and not withdrawn. Cognition and memory are impaired. He expresses impulsivity and inappropriate judgment. He does not exhibit a depressed mood. He exhibits abnormal recent memory. He exhibits normal remote memory.    Labs reviewed:  Recent Labs  03/16/16  NA 139  K 5.1  BUN 27*  CREATININE 1.2    Recent Labs  03/16/16  AST 48*  ALT 15  ALKPHOS 79    Recent Labs   03/16/16 09/28/16  WBC 8.6 6.6  HGB 12.0* 11.8*  HCT 37* 36*  PLT 246 233   Lab Results  Component Value Date   TSH 2.01 03/16/2016   Lab Results  Component Value Date   HGBA1C 5.9 03/16/2016   Lab Results  Component Value Date   CHOL 129 02/03/2014   HDL 45 02/03/2014   LDLCALC 64 02/03/2014   LDLDIRECT 139.4 04/26/2007   TRIG 99 02/03/2014  CHOLHDL 2.9 02/03/2014    Significant Diagnostic Results in last 30 days:  No results found.  Assessment/Plan Cellulitis of left lower leg Doxycycline 100mg  bid x 10day, Mycolog II cream bid LLE x 10 days, observe.   Essential hypertension Controlled, continue Metoprolol 25mg  bid  Atrial fibrillation (HCC) Atrial fibrillation remained rate controlled. Patient was placed on Eliquis 5mg  bid per neurology's recommendations. Continue Metoprolol 25mg  bid    GERD Stable. off Omeprazole 20mg -refusal.    Constipation Stable, continue Biscolax 10mg  suppository q12h prn, MiraLax daily.     Blepharitis of both eyes Improved, dc Erythromycin ophthalmic oint.   Anemia of chronic disease 09/28/16 Hgb 11.8  Edema Trace to 1+, On and off BLE  Osteoarthritis of hand, left Multiple sites. Prn Tylenol.   Hemiparesis as late effect of cerebrovascular accident (CVA) ([place]) Right sided weakness with right wrist/hand contracture. Grip strength is still 5/5. Ambulates with walker and w/c to go further. New onset of decreased the left sided coordination 01/2014. C/o worsened in his right sided weakness and stiffness. Continue anticoagulation therapy. May consider PT/OT or message therapy       Family/ staff Communication: continue SNF  Labs/tests ordered:  none

## 2016-10-21 NOTE — Assessment & Plan Note (Signed)
Stable, continue Biscolax 10mg  suppository q12h prn, MiraLax daily.

## 2016-10-21 NOTE — Assessment & Plan Note (Signed)
Multiple sites. Prn Tylenol.

## 2016-10-21 NOTE — Assessment & Plan Note (Signed)
09/28/16 Hgb 11.8

## 2016-10-21 NOTE — Assessment & Plan Note (Signed)
Controlled, continue Metoprolol 25mg bid 

## 2016-10-21 NOTE — Assessment & Plan Note (Signed)
Right sided weakness with right wrist/hand contracture. Grip strength is still 5/5. Ambulates with walker and w/c to go further. New onset of decreased the left sided coordination 01/2014. C/o worsened in his right sided weakness and stiffness. Continue anticoagulation therapy. May consider PT/OT or message therapy

## 2016-10-21 NOTE — Assessment & Plan Note (Signed)
Doxycycline 100mg  bid x 10day, Mycolog II cream bid LLE x 10 days, observe.

## 2016-11-08 ENCOUNTER — Non-Acute Institutional Stay (SKILLED_NURSING_FACILITY): Payer: Medicare Other | Admitting: Nurse Practitioner

## 2016-11-08 ENCOUNTER — Encounter: Payer: Self-pay | Admitting: Nurse Practitioner

## 2016-11-08 DIAGNOSIS — K219 Gastro-esophageal reflux disease without esophagitis: Secondary | ICD-10-CM

## 2016-11-08 DIAGNOSIS — L03116 Cellulitis of left lower limb: Secondary | ICD-10-CM | POA: Diagnosis not present

## 2016-11-08 DIAGNOSIS — I480 Paroxysmal atrial fibrillation: Secondary | ICD-10-CM

## 2016-11-08 DIAGNOSIS — I10 Essential (primary) hypertension: Secondary | ICD-10-CM | POA: Diagnosis not present

## 2016-11-08 DIAGNOSIS — K5900 Constipation, unspecified: Secondary | ICD-10-CM

## 2016-11-08 DIAGNOSIS — I69359 Hemiplegia and hemiparesis following cerebral infarction affecting unspecified side: Secondary | ICD-10-CM

## 2016-11-08 NOTE — Progress Notes (Signed)
Location:  [facility] Room Number: 67 Place of Service:  SNF (31) Provider:  Zyliah Schier, Manxie  NP  Jeanmarie Hubert, MD  Patient Care Team: Estill Dooms, MD as PCP - General (Internal Medicine) Kathrynn Ducking, MD as Consulting Physician (Neurology) Fanny Skates, MD as Consulting Physician (General Surgery) Irene Shipper, MD as Consulting Physician (Gastroenterology) Thayer Headings, MD as Consulting Physician (Cardiology) Tanda Rockers, MD as Consulting Physician (Pulmonary Disease) Amin Fornwalt Otho Darner, NP as Nurse Practitioner (Internal Medicine)  Extended Emergency Contact Information Primary Emergency Contact: Kuznicki,Williams (Rod) Address: [address]          [place], [place] [address] [place] Phone: [phone] Relation: Son  Code Status:  DNR Goals of care: Advanced Directive information Advanced Directives 11/08/2016  Does Patient Have a Medical Advance Directive? Yes  Type of Advance Directive Living will  Does patient want to make changes to medical advance directive? No - Patient declined  Copy of [place] in Chart? Yes  Pre-existing out of facility DNR order (yellow form or pink MOST form) -     Chief Complaint  Patient presents with  . Acute Visit    increased throat discomfort, upper lip swollen    HPI:  Pt is a 82 y.o. [name] seen today for an acute visit for improved swelling upper lips, denied pain or itching, no apparent injury. Denied SOB, cough, chest pain or palpitation.  Hx of late effect of CVAs with R+L sided weakness, R>L, right hand contracture, w/c for mobility, heart rate controlled A-fib, taking Eliquis for thromboembolic risk reduction, normalized HTN while on Metoprolol.    Past Medical History:  Diagnosis Date  . Abnormality of gait 05/16/2013  . Actinic keratosis   . Anemia, unspecified 07/05/2013  . Aortic valve stenosis   . 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 Bradycardia   . Bruises  easily    due to coumadin  . Cancer ([place])   . Cerebrovascular disease   . Choledocholithiasis with obstruction 06/28/13  . Contact lens/glasses fitting   . COPD (chronic obstructive pulmonary disease) ([place])   . 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 Diverticulosis   . Diverticulosis   . Dysphagia   . Elevated liver enzymes   . Embolic stroke ([place]) [id number]  . Fracture of femoral neck, right, closed ([place]) 3114  . Gait disturbance   . GERD (gastroesophageal reflux disease)   . Hearing loss    wears hearing aid - right side  . Herpes zoster 02/08/14   Left facial nerve distribution  . History of melanoma   . Hyperglycemia   . Hyperlipidemia   . Hypertension   . Leg swelling   . Long term (current) use of anticoagulants    PAF and embolic CVA  . Melanoma ([place]) 2009  . Obstructive sleep apnea of adult    uses cpap, pt does not know setting  . PAF (paroxysmal atrial fibrillation) ([place])   . Peripheral vascular disease ([place])   . Personal history of fall 05/29/2013  . Protein-calorie malnutrition, severe ([place])   . Right hemiparesis ([place])   . Seborrheic keratosis   . Stroke [facility]) 1999, 2/[name]/15  . Ventricular tachycardia (paroxysmal) ([place])   . Weakness    difficulty walking  . Xerophthalmia 07/05/2013  . Xerophthalmia    Past Surgical History:  Procedure Laterality Date  . ERCP N/A 07/04/2013   Procedure: ENDOSCOPIC RETROGRADE CHOLANGIOPANCREATOGRAPHY (ERCP);  Surgeon: Docia Chuck  Henrene Pastor, MD;  Location[facility];  Service: Endoscopy;  Laterality: N/A;  . excision of melanoma  2009   left leg  . HERNIA REPAIR  1983  . JOINT REPLACEMENT  2012   r fem head fx  . lung benign area removed   1970's  . MELANOMA EXCISION     many melanoma removed in past  . MELANOMA EXCISION  10/11/2011   Procedure: MELANOMA EXCISION;  Surgeon: [place] Earls, MD;  Location: [facility];  Service: General;  Laterality: Left;  EXCISION melanoma left leg with full thickness skin grafting from left lower abdomen.  Marland Kitchen  MELANOMA EXCISION  05/16/2012   Procedure: MELANOMA EXCISION;  Surgeon: [place] Earls, MD;  [facility];  Service: General;  Laterality: Left;  Nodule Removal of Melanoma on Left Shin    Allergies  Allergen Reactions  . Sulfonamide Derivatives Rash    REACTION: rash      Medication List       Accurate as of 11/08/16  2:34 PM. Always use your most recent med list.          acetaminophen 325 MG tablet Commonly known as:  TYLENOL Take 650 mg by mouth every 4 (four) hours as needed.   apixaban 5 MG Tabs tablet Commonly known as:  ELIQUIS Take 1 tablet (5 mg total) by mouth 2 (two) times daily.   bisacodyl 10 MG suppository Commonly known as:  DULCOLAX Place 10 mg rectally every 12 (twelve) hours as needed for moderate constipation.   carbamide peroxide 6.5 % otic solution Commonly known as:  DEBROX 5 drops. 5 drops to each ear at bedtime on the 21st of each month   metoprolol tartrate 25 MG tablet Commonly known as:  LOPRESSOR Take 25 mg by mouth 2 (two) times daily.   MIRALAX powder Generic drug:  polyethylene glycol powder Take 1 Container by mouth once. Mix 17 gr of 4 oz of water take every other day   SYSTANE 0.4-0.3 % Soln Generic drug:  Polyethyl Glycol-Propyl Glycol Apply to eye. One drop both eye four times daily   triamcinolone cream 0.1 % Commonly known as:  KENALOG Apply 1 application topically. Apply to area of itching on left and right scapula daily as needed       Review of Systems  Constitutional: Negative for chills and fever.  HENT: Positive for facial swelling and hearing loss. Negative for congestion, ear discharge, ear pain and nosebleeds.   Eyes: Negative for photophobia, pain, discharge, redness and itching.  Respiratory: Negative for cough, shortness of breath and wheezing.   Cardiovascular: Positive for leg swelling. Negative for chest pain and palpitations.       Trace  Gastrointestinal: Positive for constipation. Negative for  abdominal pain, nausea and vomiting.  Genitourinary: Positive for frequency. Negative for dysuria and urgency.  Musculoskeletal: Negative for back pain, myalgias and neck pain.       Flexion contractures of the right elbow and right wrist.  Skin: Negative for rash.       Dry scaly skin. Top of left head skin lesion 2x2x1cm rough surface.   Allergic/Immunologic: Negative for environmental allergies.  Neurological: Negative for dizziness, tremors, seizures, weakness and headaches.       Patient reporting increased weakness of the right side. History of right hemiplegia.  Hematological: Bruises/bleeds easily.       Hx anemia  Psychiatric/Behavioral: Negative for hallucinations and suicidal ideas. The patient is nervous/anxious.     Immunization History  Administered Date(s) Administered  . Influenza Whole 10/06/2010, 08/07/2011, 09/20/2012  . Influenza-Unspecified 09/05/2013, 10/09/2014, 08/26/2015, 09/21/2016  . PPD Test 08/13/2013  . Pneumococcal Polysaccharide-23 05/06/1997  . Td 03/30/2005  . Tdap 05/29/2013   Pertinent  Health Maintenance Due  Topic Date Due  . PNA vac Low Risk Adult (2 of 2 - PCV13) 05/06/1998  . INFLUENZA VACCINE  Completed   No flowsheet data found. Functional Status Survey:    Vitals:   11/08/16 1352  BP: 110/60  Pulse: 60  Resp: 18  Temp: 97.4 F (36.3 C)  Weight: 157 lb 12.8 oz (71.6 kg)  Height: 5\' 3"  (1.6 m)   Body mass index is 27.95 kg/m. Physical Exam  Constitutional: He is oriented to person, place, and time. He appears well-developed and well-nourished. No distress.  HENT:  Head: Normocephalic and atraumatic.  Right Ear: External ear normal.  Left Ear: External ear normal.  Nose: Nose normal.  Mouth/Throat: No oropharyngeal exudate.  Eyes: Pupils are equal, round, and reactive to light. Right eye exhibits no discharge. No scleral icterus.  Left eye blindness. The right lower eyelid ectropion.   Neck: Normal range of motion. Neck  supple. No JVD present. No tracheal deviation present. No thyromegaly present.  Cardiovascular: Normal rate and normal heart sounds.  An irregular rhythm present.  Pulmonary/Chest: Effort normal and breath sounds normal. No respiratory distress. He has no wheezes. He has no rales. He exhibits no tenderness.  Abdominal: He exhibits no distension. There is no tenderness.  Musculoskeletal: Normal range of motion. He exhibits edema. He exhibits no tenderness.  The right sided weakness since previous CVA-muscle strength is 3-4/5. The right wrist and hand contractures-able to make fist and cannot extend fully. No longer ambulates with walker and w/c for mobility Trace pedal edema R>L Left sided weakness from the recent CVA-mild-grip strength 5/5  Neurological: He is alert and oriented to person, place, and time. He displays abnormal reflex. No cranial nerve deficit. Coordination normal.  the left sided decreased coordination-cannot transfer self independently and dropped his utensil, grip strength is 3-4/5. Hx of right sided weakness with R hand contracture. Left facial weakness.   Skin: Skin is warm and dry. No rash noted. He is not diaphoretic. There is erythema. No pallor.  he right cholecystostomy surgical scar. The medial left lower leg surgical scar from previous melanoma removal. Top of left head skin lesion 2x2x1cm rough surface. Warm and redness LLE  Psychiatric: Thought content normal. His mood appears anxious. His affect is not angry and not inappropriate. His speech is not rapid and/or pressured, not delayed and not slurred. He is agitated. He is not aggressive, not hyperactive, not slowed and not withdrawn. Cognition and memory are impaired. He expresses impulsivity and inappropriate judgment. He does not exhibit a depressed mood. He exhibits abnormal recent memory. He exhibits normal remote memory.    Labs reviewed:  Recent Labs  03/16/16  NA 139  K 5.1  BUN 27*  CREATININE 1.2     Recent Labs  03/16/16  AST 48*  ALT 15  ALKPHOS 79    Recent Labs  03/16/16 09/28/16  WBC 8.6 6.6  HGB 12.0* 11.8*  HCT 37* 36*  PLT 246 233   Lab Results  Component Value Date   TSH 2.01 03/16/2016   Lab Results  Component Value Date   HGBA1C 5.9 03/16/2016   Lab Results  Component Value Date   CHOL 129 02/03/2014   HDL 45 02/03/2014   [place]  64 02/03/2014   LDLDIRECT 139.4 04/26/2007   TRIG 99 02/03/2014   CHOLHDL 2.9 02/03/2014    Significant Diagnostic Results in last 30 days:  No results found.  Assessment/Plan Essential hypertension Controlled, continue Metoprolol 25mg  bid, update CBC CMP   Atrial fibrillation (HCC) Atrial fibrillation remained rate controlled. Patient was placed on Eliquis 5mg  bid per neurology's recommendations. Continue Metoprolol 25mg  bid    GERD Stable. off Omeprazole 20mg -refusal.   Constipation Stable, continue Biscolax 10mg  suppository q12h prn, MiraLax daily.     Hemiparesis as late effect of cerebrovascular accident (CVA) ([place]) Right sided weakness with right wrist/hand contracture. Grip strength is still 5/5. Ambulates with walker and w/c to go further. New onset of decreased the left sided coordination 01/2014. C/o worsened in his right sided weakness and stiffness. Continue anticoagulation therapy. May consider PT/OT or message therapy    Cellulitis of left lower leg Fully treated and resolved clinically     Family/ staff Communication: SNF   Labs/tests ordered:  CBC CMP

## 2016-11-08 NOTE — Assessment & Plan Note (Signed)
Atrial fibrillation remained rate controlled. Patient was placed on Eliquis 5mg bid per neurology's recommendations. Continue Metoprolol 25mg bid 

## 2016-11-08 NOTE — Assessment & Plan Note (Signed)
Stable. off Omeprazole 20mg-refusal. 

## 2016-11-08 NOTE — Assessment & Plan Note (Signed)
Controlled, continue Metoprolol 25mg  bid, update CBC CMP

## 2016-11-08 NOTE — Assessment & Plan Note (Signed)
Fully treated and resolved clinically

## 2016-11-08 NOTE — Assessment & Plan Note (Signed)
Right sided weakness with right wrist/hand contracture. Grip strength is still 5/5. Ambulates with walker and w/c to go further. New onset of decreased the left sided coordination 01/2014. C/o worsened in his right sided weakness and stiffness. Continue anticoagulation therapy. May consider PT/OT or message therapy

## 2016-11-08 NOTE — Assessment & Plan Note (Signed)
Stable, continue Biscolax 10mg  suppository q12h prn, MiraLax daily.

## 2016-11-09 LAB — CBC AND DIFFERENTIAL
HEMATOCRIT: 35 % — AB (ref 41–53)
Hemoglobin: 11.1 g/dL — AB (ref 13.5–17.5)
PLATELETS: 240 10*3/uL (ref 150–399)
WBC: 6.7 10^3/mL

## 2016-11-09 LAB — BASIC METABOLIC PANEL
BUN: 28 mg/dL — AB (ref 4–21)
Creatinine: 1 mg/dL (ref <=1.3)
Glucose: 89 mg/dL
Potassium: 4.6 mmol/L (ref 3.4–5.3)
Sodium: 139 mmol/L (ref 137–147)

## 2016-11-11 ENCOUNTER — Other Ambulatory Visit: Payer: Self-pay | Admitting: *Deleted

## 2016-12-27 ENCOUNTER — Encounter: Payer: Self-pay | Admitting: Internal Medicine

## 2016-12-27 ENCOUNTER — Non-Acute Institutional Stay (SKILLED_NURSING_FACILITY): Payer: Medicare Other | Admitting: Internal Medicine

## 2016-12-27 DIAGNOSIS — M24531 Contracture, right wrist: Secondary | ICD-10-CM

## 2016-12-27 DIAGNOSIS — R531 Weakness: Secondary | ICD-10-CM

## 2016-12-27 DIAGNOSIS — R739 Hyperglycemia, unspecified: Secondary | ICD-10-CM | POA: Diagnosis not present

## 2016-12-27 DIAGNOSIS — D500 Iron deficiency anemia secondary to blood loss (chronic): Secondary | ICD-10-CM | POA: Diagnosis not present

## 2016-12-27 DIAGNOSIS — R609 Edema, unspecified: Secondary | ICD-10-CM

## 2016-12-27 DIAGNOSIS — I10 Essential (primary) hypertension: Secondary | ICD-10-CM | POA: Diagnosis not present

## 2016-12-27 DIAGNOSIS — I480 Paroxysmal atrial fibrillation: Secondary | ICD-10-CM | POA: Diagnosis not present

## 2016-12-27 NOTE — Progress Notes (Signed)
Progress Note    Location:  [facility] Room Number: [place] of Service:  SNF [phone]) Provider: Jeanmarie Hubert, MD  Patient Care Team: Estill Dooms, MD as PCP - General (Internal Medicine) Kathrynn Ducking, MD as Consulting Physician (Neurology) Fanny Skates, MD as Consulting Physician (General Surgery) Irene Shipper, MD as Consulting Physician (Gastroenterology) Thayer Headings, MD as Consulting Physician (Cardiology) Tanda Rockers, MD as Consulting Physician (Pulmonary Disease) Man Otho Darner, NP as Nurse Practitioner (Internal Medicine)  Extended Emergency Contact Information Primary Emergency Contact: Anaya,Williams (Rod) Address: [address]          [place], [place] [address] [place] Phone: [phone] Relation: Son  Code Status:  DNR Goals of care: Advanced Directive information Advanced Directives 12/27/2016  Does Patient Have a Medical Advance Directive? Yes  Type of Advance Directive Out of facility DNR (pink MOST or yellow form)  Does patient want to make changes to medical advance directive? -  Copy of [place] in Chart? -  Pre-existing out of facility DNR order (yellow form or pink MOST form) Yellow form placed in chart (order not valid for inpatient use)     Chief Complaint  Patient presents with  . Medical Management of Chronic Issues    routine visit    HPI:  Pt is a 83 y.o. male seen today for medical management of chronic diseases.    Weakness of right side of body - residual deficit from prior stroke. He wants to know if anything more can be done to restore function. No pain.  Contracture of joint of right forearm - sequela of prior stroke  Paroxysmal atrial fibrillation (HCC) - currently in NSR  Iron deficiency anemia due to chronic blood loss - falling hgb at 11.1 gm%. No obvious signs of acute blood loss.  Edema, unspecified type - unchanged in both lower  legs  Essential hypertension - controlled  Hyperglycemia  - last glucose 89 mg% on 11/09/16     Past Medical History:  Diagnosis Date  . Abnormality of gait 05/16/2013  . Actinic keratosis   . Anemia, unspecified 07/05/2013  . Aortic valve stenosis   . 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 Bradycardia   . Bruises easily    due to coumadin  . Cancer ([place])   . Cerebrovascular disease   . Choledocholithiasis with obstruction 06/28/13  . Contact lens/glasses fitting   . COPD (chronic obstructive pulmonary disease) ([place])   . 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 Diverticulosis   . Diverticulosis   . Dysphagia   . Elevated liver enzymes   . Embolic stroke ([place]) [id number]  . Fracture of femoral neck, right, closed ([place]) 3114  . Gait disturbance   . GERD (gastroesophageal reflux disease)   . Hearing loss    wears hearing aid - right side  . Herpes zoster 02/08/14   Left facial nerve distribution  . History of melanoma   . Hyperglycemia   . Hyperlipidemia   . Hypertension   . Leg swelling   . Long term (current) use of anticoagulants    PAF and embolic CVA  . Melanoma ([place]) 2009  . Obstructive sleep apnea of adult    uses cpap, pt does not know setting  . PAF (paroxysmal atrial fibrillation) ([place])   . Peripheral vascular disease ([place])   . Personal history of fall 05/29/2013  . Protein-calorie malnutrition, severe ([place])   . Right  hemiparesis ([place])   . Seborrheic keratosis   . Stroke [facility]) 1999, 02/02/14  . Ventricular tachycardia (paroxysmal) ([place])   . Weakness    difficulty walking  . Xerophthalmia 07/05/2013  . Xerophthalmia    Past Surgical History:  Procedure Laterality Date  . ERCP N/A 07/04/2013   Procedure: ENDOSCOPIC RETROGRADE CHOLANGIOPANCREATOGRAPHY (ERCP);  Surgeon: Irene Shipper, MD;  Location: [facility];  Service: Endoscopy;  Laterality: N/A;  . excision of melanoma  2009   left leg  . HERNIA REPAIR  1983  . JOINT REPLACEMENT  2012   r fem head fx  . lung benign area removed    1970's  . MELANOMA EXCISION     many melanoma removed in past  . MELANOMA EXCISION  10/11/2011   Procedure: MELANOMA EXCISION;  Surgeon: Pedro Earls, MD;  Location: [facility];  Service: General;  Laterality: Left;  EXCISION melanoma left leg with full thickness skin grafting from left lower abdomen.  Marland Kitchen MELANOMA EXCISION  05/16/2012   Procedure: MELANOMA EXCISION;  Surgeon: Pedro Earls, MD;  [facility]: [facility];  Service: General;  Laterality: Left;  Nodule Removal of Melanoma on Left Shin    Allergies  Allergen Reactions  . Sulfonamide Derivatives Rash    REACTION: rash    Allergies as of 12/27/2016      Reactions   Sulfonamide Derivatives Rash   REACTION: rash      Medication List       Accurate as of 12/27/16 11:23 AM. Always use your most recent med list.          acetaminophen 325 MG tablet Commonly known as:  TYLENOL Take 650 mg by mouth every 4 (four) hours as needed.   apixaban 5 MG Tabs tablet Commonly known as:  ELIQUIS Take 1 tablet (5 mg total) by mouth 2 (two) times daily.   bisacodyl 10 MG suppository Commonly known as:  DULCOLAX Place 10 mg rectally every 12 (twelve) hours as needed for moderate constipation.   carbamide peroxide 6.5 % otic solution Commonly known as:  DEBROX 5 drops. 5 drops to each ear at bedtime on the 21st of each month   metoprolol tartrate 25 MG tablet Commonly known as:  LOPRESSOR Take 25 mg by mouth 2 (two) times daily.   MIRALAX powder Generic drug:  polyethylene glycol powder Take 1 Container by mouth once. Mix 17 gr of 4 oz of water take every other day   SYSTANE 0.4-0.3 % Soln Generic drug:  Polyethyl Glycol-Propyl Glycol Apply to eye. One drop both eye four times daily   triamcinolone cream 0.1 % Commonly known as:  KENALOG Apply 1 application topically. Apply to area of itching on left and right scapula daily as needed       Review of Systems  Constitutional: Negative for chills and fever.  HENT: Positive  for facial swelling and hearing loss. Negative for congestion, ear discharge, ear pain and nosebleeds.   Eyes: Negative for photophobia, pain, discharge, redness and itching.  Respiratory: Negative for cough, shortness of breath and wheezing.   Cardiovascular: Positive for leg swelling. Negative for chest pain and palpitations.       Trace  Gastrointestinal: Positive for constipation. Negative for abdominal pain, nausea and vomiting.  Genitourinary: Positive for frequency. Negative for dysuria and urgency.  Musculoskeletal: Negative for back pain, myalgias and neck pain.       Flexion contractures of the right elbow and right wrist.  Skin: Negative for rash.  Dry scaly skin. Top of left head skin lesion 2x2x1cm rough surface.   Allergic/Immunologic: Negative for environmental allergies.  Neurological: Negative for dizziness, tremors, seizures, weakness and headaches.       Patient reporting increased weakness of the right side. History of right hemiplegia.  Hematological: Bruises/bleeds easily.       Hx anemia  Psychiatric/Behavioral: Negative for hallucinations and suicidal ideas. The patient is nervous/anxious.     Immunization History  Administered Date(s) Administered  . Influenza Whole 10/06/2010, 08/07/2011, 09/20/2012  . Influenza-Unspecified 09/05/2013, 10/09/2014, 08/26/2015, 09/21/2016  . PPD Test 08/13/2013  . Pneumococcal Polysaccharide-23 05/06/1997  . Td 03/30/2005  . Tdap 05/29/2013   Pertinent  Health Maintenance Due  Topic Date Due  . PNA vac Low Risk Adult (2 of 2 - PCV13) 05/06/1998  . INFLUENZA VACCINE  Completed     Vitals:   12/27/16 1116  BP: (!) 160/76  Pulse: 64  Resp: 20  Temp: (!) 96.3 F (35.7 C)  Weight: 159 lb (72.1 kg)  Height: 5\' 3"  (1.6 m)   Body mass index is 28.17 kg/m. Physical Exam  Constitutional: He appears well-developed and well-nourished. No distress.  HENT:  Head: Normocephalic and atraumatic.  Right Ear: External ear  normal.  Left Ear: External ear normal.  Nose: Nose normal.  Mouth/Throat: No oropharyngeal exudate.  Eyes: Pupils are equal, round, and reactive to light. Right eye exhibits no discharge. No scleral icterus.  Left eye blindness. The right lower eyelid ectropion.   Neck: Normal range of motion. Neck supple. No JVD present. No tracheal deviation present. No thyromegaly present.  Cardiovascular: Normal rate and normal heart sounds.  An irregular rhythm present.  Pulmonary/Chest: Effort normal and breath sounds normal. No respiratory distress. He has no wheezes. He has no rales. He exhibits no tenderness.  Abdominal: He exhibits no distension. There is no tenderness.  Musculoskeletal: Normal range of motion. He exhibits edema. He exhibits no tenderness.  The right sided weakness since previous CVA-muscle strength is 3-4/5. The right wrist and hand contractures-able to make fist and cannot extend fully. No longer ambulates with walker and w/c for mobility Trace pedal edema R>L Left sided weakness from the recent CVA-mild-grip strength 5/5  Neurological: He is alert. He displays abnormal reflex. No cranial nerve deficit. Coordination normal.  the left sided decreased coordination-cannot transfer self independently and dropped his utensil, grip strength is 3-4/5. Hx of right sided weakness with R hand contracture. Left facial weakness.  Slurred speech.  Skin: Skin is warm and dry. No rash noted. He is not diaphoretic. There is erythema. No pallor.  Right cholecystostomy surgical scar.  Medial left lower leg surgical scar from previous melanoma removal.  Top of left head skin lesion 2x2x1cm rough surface.  Psychiatric: Thought content normal. His mood appears anxious. His affect is not angry and not inappropriate. His speech is not rapid and/or pressured, not delayed and not slurred. He is agitated. He is not aggressive, not hyperactive, not slowed and not withdrawn. Cognition and memory are impaired. He  expresses impulsivity and inappropriate judgment. He does not exhibit a depressed mood. He exhibits abnormal recent memory. He exhibits normal remote memory.    Labs reviewed:  Recent Labs  03/16/16 11/09/16  NA 139 139  K 5.1 4.6  BUN 27* 28*  CREATININE 1.2 1.0    Recent Labs  03/16/16  AST 48*  ALT 15  ALKPHOS 79    Recent Labs  03/16/16 09/28/16 11/09/16  WBC 8.6 6.6  6.7  HGB 12.0* 11.8* 11.1*  HCT 37* 36* 35*  PLT 246 233 240   Lab Results  Component Value Date   TSH 2.01 03/16/2016   Lab Results  Component Value Date   HGBA1C 5.9 03/16/2016   Lab Results  Component Value Date   CHOL 129 02/03/2014   HDL 45 02/03/2014   LDLCALC 64 02/03/2014   LDLDIRECT 139.4 04/26/2007   TRIG 99 02/03/2014   CHOLHDL 2.9 02/03/2014   Assessment/Plan 1. Weakness of right side of body I advised him that I do not think there is any reasonable thing left to be done about the weakness on the right side.  2. Contracture of joint of right forearm I do not think splinting is likely to be any benefit.  3. Paroxysmal atrial fibrillation (HCC) NSR at present  4. Iron deficiency anemia due to chronic blood loss Continue to monitor lab.  5. Edema, unspecified type unchnaged  6. Essential hypertension controlled  7. Hyperglycemia controlled

## 2017-01-26 ENCOUNTER — Non-Acute Institutional Stay (SKILLED_NURSING_FACILITY): Payer: Medicare Other | Admitting: Nurse Practitioner

## 2017-01-26 ENCOUNTER — Encounter: Payer: Self-pay | Admitting: Nurse Practitioner

## 2017-01-26 DIAGNOSIS — I10 Essential (primary) hypertension: Secondary | ICD-10-CM

## 2017-01-26 DIAGNOSIS — K219 Gastro-esophageal reflux disease without esophagitis: Secondary | ICD-10-CM

## 2017-01-26 DIAGNOSIS — K5900 Constipation, unspecified: Secondary | ICD-10-CM

## 2017-01-26 DIAGNOSIS — I480 Paroxysmal atrial fibrillation: Secondary | ICD-10-CM | POA: Diagnosis not present

## 2017-01-26 DIAGNOSIS — D500 Iron deficiency anemia secondary to blood loss (chronic): Secondary | ICD-10-CM | POA: Diagnosis not present

## 2017-01-26 DIAGNOSIS — R609 Edema, unspecified: Secondary | ICD-10-CM | POA: Diagnosis not present

## 2017-01-26 DIAGNOSIS — I69359 Hemiplegia and hemiparesis following cerebral infarction affecting unspecified side: Secondary | ICD-10-CM

## 2017-01-26 NOTE — Assessment & Plan Note (Signed)
Stable. off Omeprazole 20mg-refusal. 

## 2017-01-26 NOTE — Assessment & Plan Note (Signed)
Right sided weakness with right wrist/hand contracture. Grip strength is still 5/5. Ambulates with walker and w/c to go further. New onset of decreased the left sided coordination 01/2014. C/o worsened in his right sided weakness and stiffness. Continue anticoagulation therapy. May consider PT/OT or message therapy

## 2017-01-26 NOTE — Assessment & Plan Note (Signed)
Controlled, continue Metoprolol 25mg bid, 

## 2017-01-26 NOTE — Assessment & Plan Note (Signed)
Trace to 1+, On and off BLE

## 2017-01-26 NOTE — Progress Notes (Signed)
Location:  [facility] Room Number: 28 Place of Service:  SNF (31) Provider:  Mast, Manxie  NP  Jeanmarie Hubert, MD  Patient Care Team: Estill Dooms, MD as PCP - General (Internal Medicine) Kathrynn Ducking, MD as Consulting Physician (Neurology) Fanny Skates, MD as Consulting Physician (General Surgery) Irene Shipper, MD as Consulting Physician (Gastroenterology) Thayer Headings, MD as Consulting Physician (Cardiology) Tanda Rockers, MD as Consulting Physician (Pulmonary Disease) Man Otho Darner, NP as Nurse Practitioner (Internal Medicine)  Extended Emergency Contact Information Primary Emergency Contact: Halder,Williams (Rod) Address: [address]          [place], [place] [address] [place] Phone: [phone] Relation: Son  Code Status:  DNR Goals of care: Advanced Directive information Advanced Directives 01/26/2017  Does Patient Have a Medical Advance Directive? Yes  Type of Advance Directive Out of facility DNR (pink MOST or yellow form);Living will  Does patient want to make changes to medical advance directive? No - Patient declined  Copy of [place] in Chart? -  Pre-existing out of facility DNR order (yellow form or pink MOST form) Pink MOST form placed in chart (order not valid for inpatient use)     Chief Complaint  Patient presents with  . Medical Management of Chronic Issues    HPI:  Pt is a 83 y.o. male seen today for medical management of chronic diseases.     Hx of late effect of CVAs with R+L sided weakness, R>L, right hand contracture, w/c for mobility, heart rate controlled A-fib, taking Eliquis for thromboembolic risk reduction, normalized HTN while on Metoprolol.   Past Medical History:  Diagnosis Date  . Abnormality of gait 05/16/2013  . Actinic keratosis   . Anemia, unspecified 07/05/2013  . Aortic valve stenosis   . 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 Bradycardia   . Bruises easily    due to coumadin  . Cancer ([place])   . Cerebrovascular disease   . Choledocholithiasis with obstruction 06/28/13  . Contact lens/glasses fitting   . COPD (chronic obstructive pulmonary disease) ([place])   . 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 Diverticulosis   . Diverticulosis   . Dysphagia   . Elevated liver enzymes   . Embolic stroke ([place]) [id number]  . Fracture of femoral neck, right, closed ([place]) 3114  . Gait disturbance   . GERD (gastroesophageal reflux disease)   . Hearing loss    wears hearing aid - right side  . Herpes zoster 02/08/14   Left facial nerve distribution  . History of melanoma   . Hyperglycemia   . Hyperlipidemia   . Hypertension   . Leg swelling   . Long term (current) use of anticoagulants    PAF and embolic CVA  . Melanoma ([place]) 2009  . Obstructive sleep apnea of adult    uses cpap, pt does not know setting  . PAF (paroxysmal atrial fibrillation) ([place])   . Peripheral vascular disease ([place])   . Personal history of fall 05/29/2013  . Protein-calorie malnutrition, severe ([place])   . Right hemiparesis ([place])   . Seborrheic keratosis   . Stroke [facility]) 1999, 02/02/14  . Ventricular tachycardia (paroxysmal) ([place])   . Weakness    difficulty walking  . Xerophthalmia 07/05/2013  . Xerophthalmia    Past Surgical History:  Procedure Laterality Date  . ERCP N/A 07/04/2013   Procedure: ENDOSCOPIC RETROGRADE CHOLANGIOPANCREATOGRAPHY (ERCP);  Surgeon: Irene Shipper, MD;  [facility];  Service: Endoscopy;  Laterality: N/A;  . excision of melanoma  2009   left leg  . HERNIA REPAIR  1983  . JOINT REPLACEMENT  2012   r fem head fx  . lung benign area removed   1970's  . MELANOMA EXCISION     many melanoma removed in past  . MELANOMA EXCISION  10/11/2011   Procedure: MELANOMA EXCISION;  Surgeon: Pedro Earls, MD;  Location: [facility];  Service: General;  Laterality: Left;  EXCISION melanoma left leg with full thickness skin grafting from left lower abdomen.  Marland Kitchen MELANOMA EXCISION   05/16/2012   Procedure: MELANOMA EXCISION;  Surgeon: Pedro Earls, MD;  [facility];  Service: General;  Laterality: Left;  Nodule Removal of Melanoma on Left Shin    Allergies  Allergen Reactions  . Sulfonamide Derivatives Rash    REACTION: rash    Allergies as of 01/26/2017      Reactions   Sulfonamide Derivatives Rash   REACTION: rash      Medication List       Accurate as of 01/26/17  2:14 PM. Always use your most recent med list.          acetaminophen 325 MG tablet Commonly known as:  TYLENOL Take 650 mg by mouth every 4 (four) hours as needed.   apixaban 5 MG Tabs tablet Commonly known as:  ELIQUIS Take 1 tablet (5 mg total) by mouth 2 (two) times daily.   bisacodyl 10 MG suppository Commonly known as:  DULCOLAX Place 10 mg rectally every 12 (twelve) hours as needed for moderate constipation.   carbamide peroxide 6.5 % otic solution Commonly known as:  DEBROX 5 drops. 5 drops to each ear at bedtime on the 21st of each month   metoprolol tartrate 25 MG tablet Commonly known as:  LOPRESSOR Take 25 mg by mouth 2 (two) times daily.   MIRALAX powder Generic drug:  polyethylene glycol powder Take 1 Container by mouth once. Mix 17 gr of 4 oz of water take every other day   SYSTANE 0.4-0.3 % Soln Generic drug:  Polyethyl Glycol-Propyl Glycol Apply to eye. One drop both eye four times daily   triamcinolone cream 0.1 % Commonly known as:  KENALOG Apply 1 application topically. Apply to area of itching on left and right scapula daily as needed       Review of Systems  Constitutional: Negative for chills and fever.  HENT: Positive for facial swelling and hearing loss. Negative for congestion, ear discharge, ear pain and nosebleeds.   Eyes: Negative for photophobia, pain, discharge, redness and itching.  Respiratory: Negative for cough, shortness of breath and wheezing.   Cardiovascular: Positive for leg swelling. Negative for chest pain and  palpitations.       Trace  Gastrointestinal: Positive for constipation. Negative for abdominal pain, nausea and vomiting.  Genitourinary: Positive for frequency. Negative for dysuria and urgency.  Musculoskeletal: Negative for back pain, myalgias and neck pain.       Flexion contractures of the right elbow and right wrist.  Skin: Negative for rash.       Dry scaly skin. Top of left head skin lesion 2x2x1cm rough surface.   Allergic/Immunologic: Negative for environmental allergies.  Neurological: Negative for dizziness, tremors, seizures, weakness and headaches.       Patient reporting increased weakness of the right side. History of right hemiplegia.  Hematological: Bruises/bleeds easily.       Hx anemia  Psychiatric/Behavioral: Negative for hallucinations and suicidal ideas. The patient is nervous/anxious.     Immunization History  Administered Date(s) Administered  . Influenza Whole 10/06/2010, 08/07/2011, 09/20/2012  . Influenza-Unspecified 09/05/2013, 10/09/2014, 08/26/2015, 09/21/2016  . PPD Test 08/13/2013  . Pneumococcal Polysaccharide-23 05/06/1997  . Td 03/30/2005  . Tdap 05/29/2013   Pertinent  Health Maintenance Due  Topic Date Due  . PNA vac Low Risk Adult (2 of 2 - PCV13) 05/06/1998  . INFLUENZA VACCINE  Completed   No flowsheet data found. Functional Status Survey:    Vitals:   01/26/17 1335  BP: 136/68  Pulse: 62  Resp: 18  Temp: 97 F (36.1 C)  Weight: 157 lb 11.2 oz (71.5 kg)  Height: 5\' 3"  (1.6 m)   Body mass index is 27.94 kg/m. Physical Exam  Constitutional: He appears well-developed and well-nourished. No distress.  HENT:  Head: Normocephalic and atraumatic.  Right Ear: External ear normal.  Left Ear: External ear normal.  Nose: Nose normal.  Mouth/Throat: No oropharyngeal exudate.  Eyes: Pupils are equal, round, and reactive to light. Right eye exhibits no discharge. No scleral icterus.  Left eye blindness. The right lower eyelid  ectropion.   Neck: Normal range of motion. Neck supple. No JVD present. No tracheal deviation present. No thyromegaly present.  Cardiovascular: Normal rate and normal heart sounds.  An irregular rhythm present.  Pulmonary/Chest: Effort normal and breath sounds normal. No respiratory distress. He has no wheezes. He has no rales. He exhibits no tenderness.  Abdominal: He exhibits no distension. There is no tenderness.  Musculoskeletal: Normal range of motion. He exhibits edema. He exhibits no tenderness.  The right sided weakness since previous CVA-muscle strength is 3-4/5. The right wrist and hand contractures-able to make fist and cannot extend fully. No longer ambulates with walker and w/c for mobility Trace pedal edema R>L Left sided weakness from the recent CVA-mild-grip strength 5/5  Neurological: He is alert. He displays abnormal reflex. No cranial nerve deficit. Coordination normal.  the left sided decreased coordination-cannot transfer self independently and dropped his utensil, grip strength is 3-4/5. Hx of right sided weakness with R hand contracture. Left facial weakness.  Slurred speech.  Skin: Skin is warm and dry. No rash noted. He is not diaphoretic. There is erythema. No pallor.  Right cholecystostomy surgical scar.  Medial left lower leg surgical scar from previous melanoma removal.  Top of left head skin lesion 2x2x1cm rough surface.  Psychiatric: Thought content normal. His mood appears anxious. His affect is not angry and not inappropriate. His speech is not rapid and/or pressured, not delayed and not slurred. He is agitated. He is not aggressive, not hyperactive, not slowed and not withdrawn. Cognition and memory are impaired. He expresses impulsivity and inappropriate judgment. He does not exhibit a depressed mood. He exhibits abnormal recent memory. He exhibits normal remote memory.    Labs reviewed:  Recent Labs  03/16/16 11/09/16  NA 139 139  K 5.1 4.6  BUN 27* 28*    CREATININE 1.2 1.0    Recent Labs  03/16/16  AST 48*  ALT 15  ALKPHOS 79    Recent Labs  03/16/16 09/28/16 11/09/16  WBC 8.6 6.6 6.7  HGB 12.0* 11.8* 11.1*  HCT 37* 36* 35*  PLT 246 233 240   Lab Results  Component Value Date   TSH 2.01 03/16/2016   Lab Results  Component Value Date   HGBA1C 5.9 03/16/2016   Lab Results  Component Value Date  CHOL 129 02/03/2014   HDL 45 02/03/2014   LDLCALC 64 02/03/2014   LDLDIRECT 139.4 04/26/2007   TRIG 99 02/03/2014   CHOLHDL 2.9 02/03/2014    Significant Diagnostic Results in last 30 days:  No results found.  Assessment/Plan Constipation Stable, continue Biscolax 10mg  suppository q12h prn, MiraLax daily.     Essential hypertension Controlled, continue Metoprolol 25mg  bid,  Atrial fibrillation (HCC) Atrial fibrillation remained rate controlled. Patient was placed on Eliquis 5mg  bid per neurology's recommendations. Continue Metoprolol 25mg  bid     GERD Stable. off Omeprazole 20mg -refusal.  Hemiparesis as late effect of cerebrovascular accident (CVA) ([place]) Right sided weakness with right wrist/hand contracture. Grip strength is still 5/5. Ambulates with walker and w/c to go further. New onset of decreased the left sided coordination 01/2014. C/o worsened in his right sided weakness and stiffness. Continue anticoagulation therapy. May consider PT/OT or message therapy  Edema Trace to 1+, On and off BLE  Anemia Hgb 11s     Family/ staff Communication: SNF  Labs/tests ordered:  none

## 2017-01-26 NOTE — Assessment & Plan Note (Signed)
Stable, continue Biscolax 10mg  suppository q12h prn, MiraLax daily.

## 2017-01-26 NOTE — Assessment & Plan Note (Signed)
Hgb 11s

## 2017-01-26 NOTE — Assessment & Plan Note (Signed)
Atrial fibrillation remained rate controlled. Patient was placed on Eliquis 5mg bid per neurology's recommendations. Continue Metoprolol 25mg bid 

## 2017-02-21 ENCOUNTER — Non-Acute Institutional Stay (SKILLED_NURSING_FACILITY): Payer: Medicare Other | Admitting: Nurse Practitioner

## 2017-02-21 ENCOUNTER — Encounter: Payer: Self-pay | Admitting: Nurse Practitioner

## 2017-02-21 DIAGNOSIS — I10 Essential (primary) hypertension: Secondary | ICD-10-CM | POA: Diagnosis not present

## 2017-02-21 DIAGNOSIS — I69359 Hemiplegia and hemiparesis following cerebral infarction affecting unspecified side: Secondary | ICD-10-CM | POA: Diagnosis not present

## 2017-02-21 DIAGNOSIS — I480 Paroxysmal atrial fibrillation: Secondary | ICD-10-CM

## 2017-02-21 DIAGNOSIS — R1310 Dysphagia, unspecified: Secondary | ICD-10-CM | POA: Diagnosis not present

## 2017-02-21 DIAGNOSIS — R609 Edema, unspecified: Secondary | ICD-10-CM

## 2017-02-21 DIAGNOSIS — K219 Gastro-esophageal reflux disease without esophagitis: Secondary | ICD-10-CM | POA: Diagnosis not present

## 2017-02-21 DIAGNOSIS — D500 Iron deficiency anemia secondary to blood loss (chronic): Secondary | ICD-10-CM

## 2017-02-21 DIAGNOSIS — K5900 Constipation, unspecified: Secondary | ICD-10-CM

## 2017-02-21 NOTE — Assessment & Plan Note (Signed)
Right sided weakness with right wrist/hand contracture. Grip strength is still 5/5. Ambulates with walker and w/c to go further. New onset of decreased the left sided coordination 01/2014. C/o worsened in his right sided weakness and stiffness. Continue anticoagulation therapy.

## 2017-02-21 NOTE — Assessment & Plan Note (Signed)
Atrial fibrillation remained rate controlled. Patient was placed on Eliquis 5mg  bid per neurology's recommendations. Continue Metoprolol 25mg  bid

## 2017-02-21 NOTE — Assessment & Plan Note (Signed)
Diet modification, still coughs with swallowing.

## 2017-02-21 NOTE — Assessment & Plan Note (Signed)
Last Hgb 11.1 11/09/16

## 2017-02-21 NOTE — Assessment & Plan Note (Signed)
Stable. off Omeprazole 20mg -refusal.

## 2017-02-21 NOTE — Assessment & Plan Note (Signed)
Controlled, continue Metoprolol 25mg  bid,

## 2017-02-21 NOTE — Assessment & Plan Note (Signed)
Trace to 1+, On and off, mainly in the LLE

## 2017-02-21 NOTE — Progress Notes (Signed)
Location:  [facility] Room Number: 16 Place of Service:  SNF (31) Provider: Dorraine Ellender, Manxie  NP  Jeanmarie Hubert, MD  Patient Care Team: Estill Dooms, MD as PCP - General (Internal Medicine) Kathrynn Ducking, MD as Consulting Physician (Neurology) Fanny Skates, MD as Consulting Physician (General Surgery) Irene Shipper, MD as Consulting Physician (Gastroenterology) Thayer Headings, MD as Consulting Physician (Cardiology) Tanda Rockers, MD as Consulting Physician (Pulmonary Disease) Terrilyn Tyner Otho Darner, NP as Nurse Practitioner (Internal Medicine)  Extended Emergency Contact Information Primary Emergency Contact: Collazos,Williams (Rod) Address: [address]          [place], [place] [address] [place] Phone: [phone] Relation: Son  Code Status:  DNR Goals of care: Advanced Directive information Advanced Directives 02/21/2017  Does Patient Have a Medical Advance Directive? Yes  Type of Advance Directive Out of facility DNR (pink MOST or yellow form);Living will  Does patient want to make changes to medical advance directive? No - Patient declined  Copy of [place] in Chart? -  Pre-existing out of facility DNR order (yellow form or pink MOST form) Yellow form placed in chart (order not valid for inpatient use)     Chief Complaint  Patient presents with  . Medical Management of Chronic Issues    HPI:  Pt is a 83 y.o. male seen today for medical management of chronic diseases.    Hx of late effect of CVAs with R+L sided weakness, R>L, right hand contracture, left leg swelling,  w/c for mobility, heart rate controlled A-fib, taking Eliquis for thromboembolic risk reduction, normalized HTN while on Metoprolol.    Past Medical History:  Diagnosis Date  . Abnormality of gait 05/16/2013  . Actinic keratosis   . Anemia, unspecified 07/05/2013  . Aortic valve stenosis   . 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 Bradycardia   .  Bruises easily    due to coumadin  . Cancer ([place])   . Cerebrovascular disease   . Choledocholithiasis with obstruction 06/28/13  . Contact lens/glasses fitting   . COPD (chronic obstructive pulmonary disease) ([place])   . 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 Diverticulosis   . Diverticulosis   . Dysphagia   . Elevated liver enzymes   . Embolic stroke ([place]) 9/98/33  . Fracture of femoral neck, right, closed ([place]) 3114  . Gait disturbance   . GERD (gastroesophageal reflux disease)   . Hearing loss    wears hearing aid - right side  . Herpes zoster 02/08/14   Left facial nerve distribution  . History of melanoma   . Hyperglycemia   . Hyperlipidemia   . Hypertension   . Leg swelling   . Long term (current) use of anticoagulants    PAF and embolic CVA  . Melanoma ([place]) 2009  . Obstructive sleep apnea of adult    uses cpap, pt does not know setting  . PAF (paroxysmal atrial fibrillation) ([place])   . Peripheral vascular disease ([place])   . Personal history of fall 05/29/2013  . Protein-calorie malnutrition, severe ([place])   . Right hemiparesis ([place])   . Seborrheic keratosis   . Stroke [facility]) 1999, 02/02/14  . Ventricular tachycardia (paroxysmal) ([place])   . Weakness    difficulty walking  . Xerophthalmia 07/05/2013  . Xerophthalmia    Past Surgical History:  Procedure Laterality Date  . ERCP N/A 07/04/2013   Procedure: ENDOSCOPIC RETROGRADE CHOLANGIOPANCREATOGRAPHY (ERCP);  Surgeon: Docia Chuck  Henrene Pastor, MD;  Location: [facility];  Service: Endoscopy;  Laterality: N/A;  . excision of melanoma  2009   left leg  . HERNIA REPAIR  1983  . JOINT REPLACEMENT  2012   r fem head fx  . lung benign area removed   1970's  . MELANOMA EXCISION     many melanoma removed in past  . MELANOMA EXCISION  10/11/2011   Procedure: MELANOMA EXCISION;  Surgeon: Pedro Earls, MD;  Location: [facility];  Service: General;  Laterality: Left;  EXCISION melanoma left leg with full thickness skin grafting from left lower abdomen.    Marland Kitchen MELANOMA EXCISION  05/16/2012   Procedure: MELANOMA EXCISION;  Surgeon: Pedro Earls, MD;  Location: [facility];  Service: General;  Laterality: Left;  Nodule Removal of Melanoma on Left Shin    Allergies  Allergen Reactions  . Sulfonamide Derivatives Rash    REACTION: rash    Allergies as of 02/21/2017      Reactions   Sulfonamide Derivatives Rash   REACTION: rash      Medication List       Accurate as of 02/21/17  4:00 PM. Always use your most recent med list.          acetaminophen 325 MG tablet Commonly known as:  TYLENOL Take 650 mg by mouth every 4 (four) hours as needed.   apixaban 5 MG Tabs tablet Commonly known as:  ELIQUIS Take 1 tablet (5 mg total) by mouth 2 (two) times daily.   bisacodyl 10 MG suppository Commonly known as:  DULCOLAX Place 10 mg rectally every 12 (twelve) hours as needed for moderate constipation.   carbamide peroxide 6.5 % otic solution Commonly known as:  DEBROX 5 drops. 5 drops to each ear at bedtime on the 21st of each month   metoprolol tartrate 25 MG tablet Commonly known as:  LOPRESSOR Take 25 mg by mouth 2 (two) times daily.   MIRALAX powder Generic drug:  polyethylene glycol powder Take 1 Container by mouth once. Mix 17 gr of 4 oz of water take every other day   SYSTANE 0.4-0.3 % Soln Generic drug:  Polyethyl Glycol-Propyl Glycol Apply to eye. One drop both eye four times daily   triamcinolone cream 0.1 % Commonly known as:  KENALOG Apply 1 application topically. Apply to area of itching on left and right scapula daily as needed       Review of Systems  Constitutional: Negative for chills and fever.  HENT: Positive for facial swelling and hearing loss. Negative for congestion, ear discharge, ear pain and nosebleeds.   Eyes: Negative for photophobia, pain, discharge, redness and itching.  Respiratory: Negative for cough, shortness of breath and wheezing.   Cardiovascular: Positive for leg swelling. Negative for  chest pain and palpitations.       Trace left leg  Gastrointestinal: Positive for constipation. Negative for abdominal pain, nausea and vomiting.  Genitourinary: Positive for frequency. Negative for dysuria and urgency.  Musculoskeletal: Negative for back pain, myalgias and neck pain.       Flexion contractures of the right elbow and right wrist.  Skin: Negative for rash.       Dry scaly skin. Top of left head skin lesion 2x2x1cm rough surface.   Allergic/Immunologic: Negative for environmental allergies.  Neurological: Negative for dizziness, tremors, seizures, weakness and headaches.       Patient reporting increased weakness of the right side. History of right hemiplegia.  Hematological: Bruises/bleeds easily.  Hx anemia  Psychiatric/Behavioral: Negative for hallucinations and suicidal ideas. The patient is nervous/anxious.     Immunization History  Administered Date(s) Administered  . Influenza Whole 10/06/2010, 08/07/2011, 09/20/2012  . Influenza-Unspecified 09/05/2013, 10/09/2014, 08/26/2015, 09/21/2016  . PPD Test 08/13/2013  . Pneumococcal Polysaccharide-23 05/06/1997  . Td 03/30/2005  . Tdap 05/29/2013   Pertinent  Health Maintenance Due  Topic Date Due  . PNA vac Low Risk Adult (2 of 2 - PCV13) 05/06/1998  . INFLUENZA VACCINE  Completed   No flowsheet data found. Functional Status Survey:    Vitals:   02/21/17 1133  BP: (!) 144/77  Pulse: 60  Resp: 18  Temp: 97.1 F (36.2 C)  Weight: 158 lb (71.7 kg)  Height: 5\' 3"  (1.6 m)   Body mass index is 27.99 kg/m. Physical Exam  Labs reviewed:  Recent Labs  03/16/16 11/09/16  NA 139 139  K 5.1 4.6  BUN 27* 28*  CREATININE 1.2 1.0    Recent Labs  03/16/16  AST 48*  ALT 15  ALKPHOS 79    Recent Labs  03/16/16 09/28/16 11/09/16  WBC 8.6 6.6 6.7  HGB 12.0* 11.8* 11.1*  HCT 37* 36* 35*  PLT 246 233 240   Lab Results  Component Value Date   TSH 2.01 03/16/2016   Lab Results  Component Value  Date   HGBA1C 5.9 03/16/2016   Lab Results  Component Value Date   CHOL 129 02/03/2014   HDL 45 02/03/2014   LDLCALC 64 02/03/2014   LDLDIRECT 139.4 04/26/2007   TRIG 99 02/03/2014   CHOLHDL 2.9 02/03/2014    Significant Diagnostic Results in last 30 days:  No results found.  Assessment/Plan Constipation constipated, continue Biscolax 10mg  suppository q12h prn, MiraLax daily. Adding Senokot S II po qhs,     Essential hypertension Controlled, continue Metoprolol 25mg  bid,  Atrial fibrillation (HCC) Atrial fibrillation remained rate controlled. Patient was placed on Eliquis 5mg  bid per neurology's recommendations. Continue Metoprolol 25mg  bid     GERD Stable. off Omeprazole 20mg -refusal.  Dysphagia Diet modification, still coughs with swallowing.   Hemiparesis as late effect of cerebrovascular accident (CVA) ([place]) Right sided weakness with right wrist/hand contracture. Grip strength is still 5/5. Ambulates with walker and w/c to go further. New onset of decreased the left sided coordination 01/2014. C/o worsened in his right sided weakness and stiffness. Continue anticoagulation therapy.   Edema Trace to 1+, On and off, mainly in the LLE  Anemia Last Hgb 11.1 11/09/16     Family/ staff Communication: SNF  Labs/tests ordered:  none

## 2017-02-21 NOTE — Assessment & Plan Note (Signed)
constipated, continue Biscolax 10mg  suppository q12h prn, MiraLax daily. Adding Senokot S II po qhs,

## 2017-03-21 ENCOUNTER — Non-Acute Institutional Stay (SKILLED_NURSING_FACILITY): Payer: Medicare Other | Admitting: Internal Medicine

## 2017-03-21 ENCOUNTER — Encounter: Payer: Self-pay | Admitting: Internal Medicine

## 2017-03-21 DIAGNOSIS — I69359 Hemiplegia and hemiparesis following cerebral infarction affecting unspecified side: Secondary | ICD-10-CM | POA: Diagnosis not present

## 2017-03-21 DIAGNOSIS — I10 Essential (primary) hypertension: Secondary | ICD-10-CM

## 2017-03-21 DIAGNOSIS — R739 Hyperglycemia, unspecified: Secondary | ICD-10-CM

## 2017-03-21 DIAGNOSIS — I480 Paroxysmal atrial fibrillation: Secondary | ICD-10-CM | POA: Diagnosis not present

## 2017-03-21 DIAGNOSIS — D500 Iron deficiency anemia secondary to blood loss (chronic): Secondary | ICD-10-CM

## 2017-03-21 DIAGNOSIS — R609 Edema, unspecified: Secondary | ICD-10-CM | POA: Diagnosis not present

## 2017-03-21 NOTE — Progress Notes (Signed)
Progress Note    Location:  [facility] Room Number: [place] of Service:  SNF [phone]) Provider:  Jeanmarie Hubert, MD  Patient Care Team: Estill Dooms, MD as PCP - General (Internal Medicine) Kathrynn Ducking, MD as Consulting Physician (Neurology) Fanny Skates, MD as Consulting Physician (General Surgery) Irene Shipper, MD as Consulting Physician (Gastroenterology) Thayer Headings, MD as Consulting Physician (Cardiology) Tanda Rockers, MD as Consulting Physician (Pulmonary Disease) Man Otho Darner, NP as Nurse Practitioner (Internal Medicine)  Extended Emergency Contact Information Primary Emergency Contact: Mcfarren,Williams (Rod) Address: [address]          [place], [place] [address] [place] Phone: [phone] Relation: Son  Code Status:  DNR Goals of care: Advanced Directive information Advanced Directives 03/21/2017  Does Patient Have a Medical Advance Directive? Yes  Type of Advance Directive Living will;Out of facility DNR (pink MOST or yellow form)  Does patient want to make changes to medical advance directive? -  Copy of [place] in Chart? -  Pre-existing out of facility DNR order (yellow form or pink MOST form) Yellow form placed in chart (order not valid for inpatient use)     Chief Complaint  Patient presents with  . Medical Management of Chronic Issues    routine visit    HPI:  Pt is a 83 y.o. male seen today for medical management of chronic diseases.    Iron deficiency anemia due to chronic blood loss - needs follow up  Paroxysmal atrial fibrillation (HCC) - seems to be in NSR today  Edema, unspecified type - 1-2+ biedal  Essential hypertension - controlled  Hemiparesis as late effect of cerebrovascular accident (CVA) ([place]) - unchanged  Hyperglycemia - needs follow up     Past Medical History:  Diagnosis Date  . Abnormality of gait 05/16/2013  . Actinic keratosis   .  Anemia, unspecified 07/05/2013  . Aortic valve stenosis   . 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 Bradycardia   . Bruises easily    due to coumadin  . Cancer ([place])   . Cerebrovascular disease   . Choledocholithiasis with obstruction 06/28/13  . Contact lens/glasses fitting   . COPD (chronic obstructive pulmonary disease) ([place])   . 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 Diverticulosis   . Diverticulosis   . Dysphagia   . Elevated liver enzymes   . Embolic stroke ([place]) 0/27/25  . Fracture of femoral neck, right, closed ([place]) 3114  . Gait disturbance   . GERD (gastroesophageal reflux disease)   . Hearing loss    wears hearing aid - right side  . Herpes zoster 02/08/14   Left facial nerve distribution  . History of melanoma   . Hyperglycemia   . Hyperlipidemia   . Hypertension   . Leg swelling   . Long term (current) use of anticoagulants    PAF and embolic CVA  . Melanoma ([place]) 2009  . Obstructive sleep apnea of adult    uses cpap, pt does not know setting  . PAF (paroxysmal atrial fibrillation) ([place])   . Peripheral vascular disease ([place])   . Personal history of fall 05/29/2013  . Protein-calorie malnutrition, severe ([place])   . Right hemiparesis ([place])   . Seborrheic keratosis   . Stroke [facility]) 1999, 02/02/14  . Ventricular tachycardia (paroxysmal) ([place])   . Weakness    difficulty walking  . Xerophthalmia 07/05/2013  . Xerophthalmia  Past Surgical History:  Procedure Laterality Date  . ERCP N/A 07/04/2013   Procedure: ENDOSCOPIC RETROGRADE CHOLANGIOPANCREATOGRAPHY (ERCP);  Surgeon: Irene Shipper, MD;  Location: [facility];  Service: Endoscopy;  Laterality: N/A;  . excision of melanoma  2009   left leg  . HERNIA REPAIR  1983  . JOINT REPLACEMENT  2012   r fem head fx  . lung benign area removed   1970's  . MELANOMA EXCISION     many melanoma removed in past  . MELANOMA EXCISION  10/11/2011   Procedure: MELANOMA EXCISION;  Surgeon: Pedro Earls, MD;  Location: [facility];  Service: General;   Laterality: Left;  EXCISION melanoma left leg with full thickness skin grafting from left lower abdomen.  Marland Kitchen MELANOMA EXCISION  05/16/2012   Procedure: MELANOMA EXCISION;  Surgeon: Pedro Earls, MD;  Location: [facility];  Service: General;  Laterality: Left;  Nodule Removal of Melanoma on Left Shin    Allergies  Allergen Reactions  . Sulfonamide Derivatives Rash    REACTION: rash    Outpatient Encounter Prescriptions as of 03/21/2017  Medication Sig  . acetaminophen (TYLENOL) 325 MG tablet Take 650 mg by mouth every 4 (four) hours as needed.   Marland Kitchen apixaban (ELIQUIS) 5 MG TABS tablet Take 1 tablet (5 mg total) by mouth 2 (two) times daily.  . bisacodyl (DULCOLAX) 10 MG suppository Place 10 mg rectally every 12 (twelve) hours as needed for moderate constipation.  . carbamide peroxide (DEBROX) 6.5 % otic solution 5 drops. 5 drops to each ear at bedtime on the 21st of each month  . metoprolol tartrate (LOPRESSOR) 25 MG tablet Take 25 mg by mouth 2 (two) times daily.  Vladimir Faster Glycol-Propyl Glycol (SYSTANE) 0.4-0.3 % SOLN Apply to eye. One drop both eye four times daily  . polyethylene glycol powder (MIRALAX) powder Take 1 Container by mouth once. Mix 17 gr of 4 oz of water take every other day  . triamcinolone cream (KENALOG) 0.1 % Apply 1 application topically. Apply to area of itching on left and right scapula daily as needed   No facility-administered encounter medications on file as of 03/21/2017.     Review of Systems  Constitutional: Negative for chills and fever.  HENT: Positive for facial swelling and hearing loss. Negative for congestion, ear discharge, ear pain and nosebleeds.   Eyes: Negative for photophobia, pain, discharge, redness and itching.  Respiratory: Negative for cough, shortness of breath and wheezing.   Cardiovascular: Positive for leg swelling. Negative for chest pain and palpitations.       Trace left leg  Gastrointestinal: Positive for constipation. Negative for  abdominal pain, nausea and vomiting.  Genitourinary: Positive for frequency. Negative for dysuria and urgency.  Musculoskeletal: Negative for back pain, myalgias and neck pain.       Flexion contractures of the right elbow and right wrist.  Skin: Negative for rash.       Dry scaly skin. Top of left head skin lesion 2x2x1cm rough surface.   Allergic/Immunologic: Negative for environmental allergies.  Neurological: Negative for dizziness, tremors, seizures, weakness and headaches.       Hx. CVA. Right hemiplegia.  Hematological: Bruises/bleeds easily.       Hx anemia  Psychiatric/Behavioral: Negative for hallucinations and suicidal ideas. The patient is nervous/anxious.     Immunization History  Administered Date(s) Administered  . Influenza Whole 10/06/2010, 08/07/2011, 09/20/2012  . Influenza-Unspecified 09/05/2013, 10/09/2014, 08/26/2015, 09/21/2016  . PPD Test 08/13/2013  . Pneumococcal  Polysaccharide-23 05/06/1997  . Td 03/30/2005  . Tdap 05/29/2013   Pertinent  Health Maintenance Due  Topic Date Due  . PNA vac Low Risk Adult (2 of 2 - PCV13) 05/06/1998  . INFLUENZA VACCINE  07/06/2017   No flowsheet data found. Functional Status Survey:    Vitals:   03/21/17 1124  BP: 120/62  Pulse: (!) 54  Resp: (!) 22  Temp: 97.9 F (36.6 C)  Weight: 157 lb 3.2 oz (71.3 kg)  Height: 5\' 3"  (1.6 m)   Body mass index is 27.85 kg/m. Physical Exam  Constitutional: He appears well-developed and well-nourished. No distress.  HENT:  Head: Normocephalic and atraumatic.  Right Ear: External ear normal.  Left Ear: External ear normal.  Nose: Nose normal.  Mouth/Throat: No oropharyngeal exudate.  Eyes: Pupils are equal, round, and reactive to light. Right eye exhibits no discharge. No scleral icterus.  Left eye blindness. The right lower eyelid ectropion.   Neck: Normal range of motion. Neck supple. No JVD present. No tracheal deviation present. No thyromegaly present.    Cardiovascular: Normal rate and normal heart sounds.  An irregular rhythm present.  Pulmonary/Chest: Effort normal and breath sounds normal. No respiratory distress. He has no wheezes. He has no rales. He exhibits no tenderness.  Abdominal: He exhibits no distension. There is no tenderness.  Musculoskeletal: Normal range of motion. He exhibits edema. He exhibits no tenderness.  Right sided weakness since previous CVA  Right wrist and hand contractures-able to make fist and cannot extend fully. No longer ambulates with walker. Using wheelchair for mobility  1-2+pedal edema R>L.  Neurological: He is alert. He displays abnormal reflex. No cranial nerve deficit. Coordination normal.  the left sided decreased coordination-cannot transfer self independently and dropped his utensil, grip strength is 3-4/5. Hx of right sided weakness with R hand contracture. Left facial weakness.  Slurred speech.  Skin: Skin is warm and dry. No rash noted. He is not diaphoretic. There is erythema. No pallor.  Right cholecystostomy surgical scar.  Medial left lower leg surgical scar from previous melanoma removal.   Psychiatric: Thought content normal. His mood appears anxious. His affect is not angry and not inappropriate. His speech is not rapid and/or pressured, not delayed and not slurred. He is agitated. He is not aggressive, not hyperactive, not slowed and not withdrawn. Cognition and memory are impaired. He expresses impulsivity and inappropriate judgment. He does not exhibit a depressed mood. He exhibits abnormal recent memory. He exhibits normal remote memory.    Labs reviewed:  Recent Labs  11/09/16  NA 139  K 4.6  BUN 28*  CREATININE 1.0   No results for input(s): AST, ALT, ALKPHOS, BILITOT, PROT, ALBUMIN in the last 8760 hours.  Recent Labs  09/28/16 11/09/16  WBC 6.6 6.7  HGB 11.8* 11.1*  HCT 36* 35*  PLT 233 240   Lab Results  Component Value Date   TSH 2.01 03/16/2016   Lab Results   Component Value Date   HGBA1C 5.9 03/16/2016   Lab Results  Component Value Date   CHOL 129 02/03/2014   HDL 45 02/03/2014   LDLCALC 64 02/03/2014   LDLDIRECT 139.4 04/26/2007   TRIG 99 02/03/2014   CHOLHDL 2.9 02/03/2014     Assessment/Plan 1. Iron deficiency anemia due to chronic blood loss -CBC  2. Paroxysmal atrial fibrillation (HCC) Currently in NSR  3. Edema, unspecified type Unchanged  4. Essential hypertension controlled  5. Hemiparesis as late effect of cerebrovascular accident (CVA) (  [place]) stable  6. Hyperglycemia -CMP

## 2017-03-22 LAB — CBC AND DIFFERENTIAL
HCT: 34 % — AB (ref 41–53)
HEMOGLOBIN: 11.7 g/dL — AB (ref 13.5–17.5)
Platelets: 238 10*3/uL (ref 150–399)
WBC: 7 10*3/mL

## 2017-03-22 LAB — HEPATIC FUNCTION PANEL
ALK PHOS: 107 U/L (ref 25–125)
ALT: 6 U/L — AB (ref 10–40)
AST: 11 U/L — AB (ref 14–40)
BILIRUBIN, TOTAL: 0.4 mg/dL

## 2017-03-22 LAB — BASIC METABOLIC PANEL
BUN: 30 mg/dL — AB (ref 4–21)
Creatinine: 1 mg/dL (ref <=1.3)
GLUCOSE: 82 mg/dL
Potassium: 5.1 mmol/L (ref 3.4–5.3)
Sodium: 136 mmol/L — AB (ref 137–147)

## 2017-03-24 ENCOUNTER — Other Ambulatory Visit: Payer: Self-pay | Admitting: *Deleted

## 2017-04-14 LAB — BASIC METABOLIC PANEL: SODIUM: 139 mmol/L (ref 137–147)

## 2017-04-20 ENCOUNTER — Non-Acute Institutional Stay (SKILLED_NURSING_FACILITY): Payer: Medicare Other | Admitting: Nurse Practitioner

## 2017-04-20 ENCOUNTER — Encounter: Payer: Self-pay | Admitting: Nurse Practitioner

## 2017-04-20 DIAGNOSIS — I10 Essential (primary) hypertension: Secondary | ICD-10-CM

## 2017-04-20 DIAGNOSIS — K5900 Constipation, unspecified: Secondary | ICD-10-CM

## 2017-04-20 DIAGNOSIS — M19042 Primary osteoarthritis, left hand: Secondary | ICD-10-CM | POA: Diagnosis not present

## 2017-04-20 DIAGNOSIS — K219 Gastro-esophageal reflux disease without esophagitis: Secondary | ICD-10-CM

## 2017-04-20 DIAGNOSIS — I480 Paroxysmal atrial fibrillation: Secondary | ICD-10-CM | POA: Diagnosis not present

## 2017-04-20 DIAGNOSIS — D500 Iron deficiency anemia secondary to blood loss (chronic): Secondary | ICD-10-CM

## 2017-04-20 DIAGNOSIS — R609 Edema, unspecified: Secondary | ICD-10-CM | POA: Diagnosis not present

## 2017-04-20 DIAGNOSIS — I639 Cerebral infarction, unspecified: Secondary | ICD-10-CM | POA: Diagnosis not present

## 2017-04-20 NOTE — Assessment & Plan Note (Signed)
03/22/17 Hgb 11.7

## 2017-04-20 NOTE — Assessment & Plan Note (Signed)
Stable. off Omeprazole 20mg -refusal.

## 2017-04-20 NOTE — Assessment & Plan Note (Addendum)
Controlled, continue Metoprolol 25mg  bid, 03/22/17 Na 136, K 5.1, Bun 30, creat 1.02, wbc 7.0, Hgb 11.7, plt 238, update BMP

## 2017-04-20 NOTE — Assessment & Plan Note (Signed)
Right sided weakness with right wrist/hand contracture. Grip strength is still 5/5. Ambulates with walker and w/c to go further. New onset of decreased the left sided coordination 01/2014. C/o worsened in his right sided weakness and stiffness. Continue anticoagulation therapy.

## 2017-04-20 NOTE — Progress Notes (Signed)
Location:  [place] Room Number: 89 Place of Service:  SNF ([phone]) Provider:  Mast, Manxie  NP  Estill Dooms, MD  Patient Care Team: Estill Dooms, MD as PCP - General (Internal Medicine) Kathrynn Ducking, MD as Consulting Physician (Neurology) Fanny Skates, MD as Consulting Physician (General Surgery) Irene Shipper, MD as Consulting Physician (Gastroenterology) Nahser, Wonda Cheng, MD as Consulting Physician (Cardiology) Tanda Rockers, MD as Consulting Physician (Pulmonary Disease) Mast, Man X, NP as Nurse Practitioner (Internal Medicine)  Extended Emergency Contact Information Primary Emergency Contact: Elsbernd,Williams (Rod) Address: [address]          [place], [place] [address] [place] Phone: [phone] Relation: Son  Code Status:  DNR Goals of care: Advanced Directive information Advanced Directives 04/20/2017  Does Patient Have a Medical Advance Directive? Yes  Type of Advance Directive Living will;Out of facility DNR (pink MOST or yellow form)  Does patient want to make changes to medical advance directive? No - Patient declined  Copy of [place] in Chart? -  Pre-existing out of facility DNR order (yellow form or pink MOST form) Yellow form placed in chart (order not valid for inpatient use)     Chief Complaint  Patient presents with  . Medical Management of Chronic Issues    HPI:  Pt is a 83 y.o. male seen today for medical management of chronic diseases.      Hx of late effect of CVAs with R+L sided weakness, R>L, right hand contracture, left leg swelling,  w/c for mobility, heart rate controlled A-fib, taking Eliquis for thromboembolic risk reduction, normalized HTN while on Metoprolol 25mg  bid.    Past Medical History:  Diagnosis Date  . Abnormality of gait 05/16/2013  . Actinic keratosis   . Anemia, unspecified 07/05/2013  . Aortic valve stenosis   . 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 Bradycardia   . Bruises easily    due to coumadin  . Cancer ([place])   . Cerebrovascular disease   . Choledocholithiasis with obstruction 06/28/13  . Contact lens/glasses fitting   . COPD (chronic obstructive pulmonary disease) ([place])   . 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 Diverticulosis   . Diverticulosis   . Dysphagia   . Elevated liver enzymes   . Embolic stroke ([place]) 08/05/50  . Fracture of femoral neck, right, closed ([place]) 3114  . Gait disturbance   . GERD (gastroesophageal reflux disease)   . Hearing loss    wears hearing aid - right side  . Herpes zoster 02/08/14   Left facial nerve distribution  . History of melanoma   . Hyperglycemia   . Hyperlipidemia   . Hypertension   . Leg swelling   . Long term (current) use of anticoagulants    PAF and embolic CVA  . Melanoma ([place]) 2009  . Obstructive sleep apnea of adult    uses cpap, pt does not know setting  . PAF (paroxysmal atrial fibrillation) ([place])   . Peripheral vascular disease ([place])   . Personal history of fall 05/29/2013  . Protein-calorie malnutrition, severe ([place])   . Right hemiparesis ([place])   . Seborrheic keratosis   . Stroke [facility]) 1999, 02/02/14  . Ventricular tachycardia (paroxysmal) ([place])   . Weakness    difficulty walking  . Xerophthalmia 07/05/2013  . Xerophthalmia    Past Surgical History:  Procedure Laterality Date  . ERCP N/A 07/04/2013   Procedure: ENDOSCOPIC  RETROGRADE CHOLANGIOPANCREATOGRAPHY (ERCP);  Surgeon: Irene Shipper, MD;  Location: [facility];  Service: Endoscopy;  Laterality: N/A;  . excision of melanoma  2009   left leg  . HERNIA REPAIR  1983  . JOINT REPLACEMENT  2012   r fem head fx  . lung benign area removed   1970's  . MELANOMA EXCISION     many melanoma removed in past  . MELANOMA EXCISION  10/11/2011   Procedure: MELANOMA EXCISION;  Surgeon: Pedro Earls, MD;  Location: [place];  Service: General;  Laterality: Left;  EXCISION melanoma left leg with full thickness skin grafting from  left lower abdomen.  Marland Kitchen MELANOMA EXCISION  05/16/2012   Procedure: MELANOMA EXCISION;  Surgeon: Pedro Earls, MD;  Location: [facility];  Service: General;  Laterality: Left;  Nodule Removal of Melanoma on Left Shin    Allergies  Allergen Reactions  . Sulfonamide Derivatives Rash    REACTION: rash    Outpatient Encounter Prescriptions as of 04/20/2017  Medication Sig  . acetaminophen (TYLENOL) 325 MG tablet Take 650 mg by mouth every 4 (four) hours as needed.   Marland Kitchen apixaban (ELIQUIS) 5 MG TABS tablet Take 1 tablet (5 mg total) by mouth 2 (two) times daily.  . bisacodyl (DULCOLAX) 10 MG suppository Place 10 mg rectally every 12 (twelve) hours as needed for moderate constipation.  . carbamide peroxide (DEBROX) 6.5 % otic solution 5 drops. 5 drops to each ear at bedtime on the 21st of each month  . metoprolol tartrate (LOPRESSOR) 25 MG tablet Take 25 mg by mouth 2 (two) times daily.  Vladimir Faster Glycol-Propyl Glycol (SYSTANE) 0.4-0.3 % SOLN Apply to eye. One drop both eye four times daily  . polyethylene glycol powder (MIRALAX) powder Take 1 Container by mouth once. Mix 17 gr of 4 oz of water take every other day  . triamcinolone cream (KENALOG) 0.1 % Apply 1 application topically. Apply to area of itching on left and right scapula daily as needed   No facility-administered encounter medications on file as of 04/20/2017.     Review of Systems  Constitutional: Negative for chills and fever.  HENT: Positive for facial swelling and hearing loss. Negative for congestion, ear discharge, ear pain and nosebleeds.   Eyes: Negative for photophobia, pain, discharge, redness and itching.  Respiratory: Negative for cough, shortness of breath and wheezing.   Cardiovascular: Positive for leg swelling. Negative for chest pain and palpitations.       Trace left leg  Gastrointestinal: Positive for constipation. Negative for abdominal pain, nausea and vomiting.  Genitourinary: Positive for frequency. Negative  for dysuria and urgency.  Musculoskeletal: Negative for back pain, myalgias and neck pain.       Flexion contractures of the right elbow and right wrist.  Skin: Negative for rash.       Dry scaly skin. Top of left head skin lesion 2x2x1cm rough surface.   Allergic/Immunologic: Negative for environmental allergies.  Neurological: Negative for dizziness, tremors, seizures, weakness and headaches.       Hx. CVA. Right hemiplegia.  Hematological: Bruises/bleeds easily.       Hx anemia  Psychiatric/Behavioral: Negative for hallucinations and suicidal ideas. The patient is nervous/anxious.     Immunization History  Administered Date(s) Administered  . Influenza Whole 10/06/2010, 08/07/2011, 09/20/2012  . Influenza-Unspecified 09/05/2013, 10/09/2014, 08/26/2015, 09/21/2016  . PPD Test 08/13/2013  . Pneumococcal Polysaccharide-23 05/06/1997  . Td 03/30/2005  . Tdap 05/29/2013   Pertinent  Health Maintenance  Due  Topic Date Due  . PNA vac Low Risk Adult (2 of 2 - PCV13) 05/06/1998  . INFLUENZA VACCINE  07/06/2017   No flowsheet data found. Functional Status Survey:    Vitals:   04/20/17 1229  BP: 112/60  Pulse: 84  Resp: 18  Temp: 97.4 F (36.3 C)  Weight: 151 lb 14.4 oz (68.9 kg)  Height: 5\' 3"  (1.6 m)   Body mass index is 26.91 kg/m. Physical Exam  Constitutional: He appears well-developed and well-nourished. No distress.  HENT:  Head: Normocephalic and atraumatic.  Right Ear: External ear normal.  Left Ear: External ear normal.  Nose: Nose normal.  Mouth/Throat: No oropharyngeal exudate.  Eyes: Pupils are equal, round, and reactive to light. Right eye exhibits no discharge. No scleral icterus.  Left eye blindness. The right lower eyelid ectropion.   Neck: Normal range of motion. Neck supple. No JVD present. No tracheal deviation present. No thyromegaly present.  Cardiovascular: Normal rate and normal heart sounds.  An irregular rhythm present.  Pulmonary/Chest: Effort  normal and breath sounds normal. No respiratory distress. He has no wheezes. He has no rales. He exhibits no tenderness.  Abdominal: He exhibits no distension. There is no tenderness.  Musculoskeletal: Normal range of motion. He exhibits edema. He exhibits no tenderness.  Right sided weakness since previous CVA  Right wrist and hand contractures-able to make fist and cannot extend fully. No longer ambulates with walker. Using wheelchair for mobility  1-2+pedal edema R>L.  Neurological: He is alert. He displays abnormal reflex. No cranial nerve deficit. Coordination normal.  the left sided decreased coordination-cannot transfer self independently and dropped his utensil, grip strength is 3-4/5. Hx of right sided weakness with R hand contracture. Left facial weakness.  Slurred speech.  Skin: Skin is warm and dry. No rash noted. He is not diaphoretic. There is erythema. No pallor.  Right cholecystostomy surgical scar.  Medial left lower leg surgical scar from previous melanoma removal.   Psychiatric: Thought content normal. His mood appears anxious. His affect is not angry and not inappropriate. His speech is not rapid and/or pressured, not delayed and not slurred. He is agitated. He is not aggressive, not hyperactive, not slowed and not withdrawn. Cognition and memory are impaired. He expresses impulsivity and inappropriate judgment. He does not exhibit a depressed mood. He exhibits abnormal recent memory. He exhibits normal remote memory.    Labs reviewed:  Recent Labs  11/09/16 03/22/17  NA 139 136*  K 4.6 5.1  BUN 28* 30*  CREATININE 1.0 1.0    Recent Labs  03/22/17  AST 11*  ALT 6*  ALKPHOS 107    Recent Labs  09/28/16 11/09/16 03/22/17  WBC 6.6 6.7 7.0  HGB 11.8* 11.1* 11.7*  HCT 36* 35* 34*  PLT 233 240 238   Lab Results  Component Value Date   TSH 2.01 03/16/2016   Lab Results  Component Value Date   HGBA1C 5.9 03/16/2016   Lab Results  Component Value Date   CHOL  129 02/03/2014   HDL 45 02/03/2014   LDLCALC 64 02/03/2014   LDLDIRECT 139.4 04/26/2007   TRIG 99 02/03/2014   CHOLHDL 2.9 02/03/2014    Significant Diagnostic Results in last 30 days:  No results found.  Assessment/Plan Essential hypertension Controlled, continue Metoprolol 25mg  bid, 03/22/17 Na 136, K 5.1, Bun 30, creat 1.02, wbc 7.0, Hgb 11.7, plt 238, update BMP  Atrial fibrillation (HCC) Atrial fibrillation remained rate controlled. Patient was placed on  Eliquis 5mg  bid per neurology's recommendations. Continue Metoprolol 25mg  bid  Acute ischemic stroke Right sided weakness with right wrist/hand contracture. Grip strength is still 5/5. Ambulates with walker and w/c to go further. New onset of decreased the left sided coordination 01/2014. C/o worsened in his right sided weakness and stiffness. Continue anticoagulation therapy.  GERD Stable. off Omeprazole 20mg -refusal.  Constipation stable, continue Biscolax 10mg  suppository q12h prn, MiraLax daily.   Osteoarthritis of hand, left Multiple sites, Tylenol prn is available to him  Edema Trace to 1+, On and off, mainly in the LLE  Anemia 03/22/17 Hgb 11.7     Family/ staff Communication: SNF  Labs/tests ordered:  BMP

## 2017-04-20 NOTE — Assessment & Plan Note (Signed)
stable, continue Biscolax 10mg  suppository q12h prn, MiraLax daily.

## 2017-04-20 NOTE — Assessment & Plan Note (Signed)
Multiple sites, Tylenol prn is available to him

## 2017-04-20 NOTE — Assessment & Plan Note (Signed)
Trace to 1+, On and off, mainly in the LLE

## 2017-04-20 NOTE — Assessment & Plan Note (Signed)
Atrial fibrillation remained rate controlled. Patient was placed on Eliquis 5mg  bid per neurology's recommendations. Continue Metoprolol 25mg  bid

## 2017-04-21 ENCOUNTER — Other Ambulatory Visit: Payer: Self-pay | Admitting: *Deleted

## 2017-04-21 LAB — BASIC METABOLIC PANEL
BUN: 32 mg/dL — AB (ref 4–21)
CREATININE: 1.1 mg/dL (ref <=1.3)
Glucose: 84 mg/dL
Potassium: 4.6 mmol/L (ref 3.4–5.3)

## 2017-05-04 ENCOUNTER — Encounter: Payer: Self-pay | Admitting: Nurse Practitioner

## 2017-05-04 ENCOUNTER — Non-Acute Institutional Stay (SKILLED_NURSING_FACILITY): Payer: Medicare Other | Admitting: Nurse Practitioner

## 2017-05-04 DIAGNOSIS — H0100A Unspecified blepharitis right eye, upper and lower eyelids: Secondary | ICD-10-CM

## 2017-05-04 DIAGNOSIS — H01002 Unspecified blepharitis right lower eyelid: Secondary | ICD-10-CM

## 2017-05-04 DIAGNOSIS — I69359 Hemiplegia and hemiparesis following cerebral infarction affecting unspecified side: Secondary | ICD-10-CM

## 2017-05-04 DIAGNOSIS — I10 Essential (primary) hypertension: Secondary | ICD-10-CM | POA: Diagnosis not present

## 2017-05-04 DIAGNOSIS — H01005 Unspecified blepharitis left lower eyelid: Secondary | ICD-10-CM | POA: Diagnosis not present

## 2017-05-04 DIAGNOSIS — H01001 Unspecified blepharitis right upper eyelid: Secondary | ICD-10-CM | POA: Diagnosis not present

## 2017-05-04 DIAGNOSIS — H01004 Unspecified blepharitis left upper eyelid: Secondary | ICD-10-CM | POA: Diagnosis not present

## 2017-05-04 DIAGNOSIS — H0100B Unspecified blepharitis left eye, upper and lower eyelids: Principal | ICD-10-CM

## 2017-05-04 DIAGNOSIS — I480 Paroxysmal atrial fibrillation: Secondary | ICD-10-CM | POA: Diagnosis not present

## 2017-05-04 DIAGNOSIS — D500 Iron deficiency anemia secondary to blood loss (chronic): Secondary | ICD-10-CM

## 2017-05-04 DIAGNOSIS — K219 Gastro-esophageal reflux disease without esophagitis: Secondary | ICD-10-CM | POA: Diagnosis not present

## 2017-05-04 DIAGNOSIS — R609 Edema, unspecified: Secondary | ICD-10-CM | POA: Diagnosis not present

## 2017-05-04 DIAGNOSIS — K5900 Constipation, unspecified: Secondary | ICD-10-CM | POA: Diagnosis not present

## 2017-05-04 NOTE — Assessment & Plan Note (Signed)
Trace to 1+, On and off, mainly in the LLE

## 2017-05-04 NOTE — Assessment & Plan Note (Signed)
Atrial fibrillation remained rate controlled. Patient was placed on Eliquis 5mg  bid per neurology's recommendations. Continue Metoprolol 25mg  bid

## 2017-05-04 NOTE — Assessment & Plan Note (Signed)
stable, continue Biscolax 10mg  suppository q12h prn

## 2017-05-04 NOTE — Assessment & Plan Note (Signed)
Stable. off Omeprazole 20mg -refusal.

## 2017-05-04 NOTE — Assessment & Plan Note (Addendum)
Controlled, continue Metoprolol 25mg  bid, 04/21/17 Na 139, K 4.6, Bun 32, creat 1.06

## 2017-05-04 NOTE — Assessment & Plan Note (Signed)
Right sided weakness with right wrist/hand contracture. Grip strength is still 5/5. Ambulates with walker and w/c to go further. New onset of decreased the left sided coordination 01/2014. C/o worsened in his right sided weakness and stiffness. Continue anticoagulation therapy.

## 2017-05-04 NOTE — Assessment & Plan Note (Signed)
03/22/17 Hgb 11.7

## 2017-05-04 NOTE — Assessment & Plan Note (Signed)
Crusted and greasy eyelashes, redness at the margin of the eyelids, R>L, not conjunctival injection, denied itching or pain or change of vision associated with. 1% erythromycin ophthalmic ointment 1cm ribbon to the lower conjunctival sac qhs. Observe.

## 2017-05-04 NOTE — Progress Notes (Signed)
Location:  [place] Room Number: 39 Place of Service:  SNF ([phone]) Provider:  Mast, Manxie  NP  Estill Dooms, MD  Patient Care Team: Estill Dooms, MD as PCP - General (Internal Medicine) Kathrynn Ducking, MD as Consulting Physician (Neurology) Fanny Skates, MD as Consulting Physician (General Surgery) Irene Shipper, MD as Consulting Physician (Gastroenterology) Nahser, Wonda Cheng, MD as Consulting Physician (Cardiology) Tanda Rockers, MD as Consulting Physician (Pulmonary Disease) Mast, Man X, NP as Nurse Practitioner (Internal Medicine)  Extended Emergency Contact Information Primary Emergency Contact: Shabazz,Williams (Rod) Address: [address]          [place], [place] [address] [place] Phone: [phone] Relation: Son  Code Status:  DNR Goals of care: Advanced Directive information Advanced Directives 05/04/2017  Does Patient Have a Medical Advance Directive? Yes  Type of Advance Directive Living will;Out of facility DNR (pink MOST or yellow form)  Does patient want to make changes to medical advance directive? No - Patient declined  Copy of [place] in Chart? -  Pre-existing out of facility DNR order (yellow form or pink MOST form) -     Chief Complaint  Patient presents with  . Acute Visit    swelling and redness to (R) eyelid    HPI:  Pt is a 83 y.o. male seen today for Crusted, greasy eyelashes, redness at the margin of the eyelids, R>L, not conjunctival injection, denied itching or pain or change of vision associated with.     Hx of late effect of CVAs with R+L sided weakness, R>L, right hand contracture, left leg swelling,  w/c for mobility, heart rate controlled A-fib, taking Eliquis for thromboembolic risk reduction, normalized HTN while on Metoprolol 25mg  bid.    Past Medical History:  Diagnosis Date  . Abnormality of gait 05/16/2013  . Actinic keratosis   . Anemia,  unspecified 07/05/2013  . Aortic valve stenosis   . 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 Bradycardia   . Bruises easily    due to coumadin  . Cancer ([place])   . Cerebrovascular disease   . Choledocholithiasis with obstruction 06/28/13  . Contact lens/glasses fitting   . COPD (chronic obstructive pulmonary disease) ([place])   . 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 Diverticulosis   . Diverticulosis   . Dysphagia   . Elevated liver enzymes   . Embolic stroke ([place]) 5/95/63  . Fracture of femoral neck, right, closed ([place]) 3114  . Gait disturbance   . GERD (gastroesophageal reflux disease)   . Hearing loss    wears hearing aid - right side  . Herpes zoster 02/08/14   Left facial nerve distribution  . History of melanoma   . Hyperglycemia   . Hyperlipidemia   . Hypertension   . Leg swelling   . Long term (current) use of anticoagulants    PAF and embolic CVA  . Melanoma ([place]) 2009  . Obstructive sleep apnea of adult    uses cpap, pt does not know setting  . PAF (paroxysmal atrial fibrillation) ([place])   . Peripheral vascular disease ([place])   . Personal history of fall 05/29/2013  . Protein-calorie malnutrition, severe ([place])   . Right hemiparesis ([place])   . Seborrheic keratosis   . Stroke [facility]) 1999, 02/02/14  . Ventricular tachycardia (paroxysmal) ([place])   . Weakness    difficulty walking  . Xerophthalmia 07/05/2013  . Xerophthalmia    Past Surgical History:  Procedure Laterality Date  . ERCP N/A 07/04/2013   Procedure: ENDOSCOPIC RETROGRADE CHOLANGIOPANCREATOGRAPHY (ERCP);  Surgeon: Irene Shipper, MD;  Location: [facility];  Service: Endoscopy;  Laterality: N/A;  . excision of melanoma  2009   left leg  . HERNIA REPAIR  1983  . JOINT REPLACEMENT  2012   r fem head fx  . lung benign area removed   1970's  . MELANOMA EXCISION     many melanoma removed in past  . MELANOMA EXCISION  10/11/2011   Procedure: MELANOMA EXCISION;  Surgeon: Pedro Earls, MD;  Location: [facility];  Service: General;   Laterality: Left;  EXCISION melanoma left leg with full thickness skin grafting from left lower abdomen.  Marland Kitchen MELANOMA EXCISION  05/16/2012   Procedure: MELANOMA EXCISION;  Surgeon: Pedro Earls, MD;  [facility];  Service: General;  Laterality: Left;  Nodule Removal of Melanoma on Left Shin    Allergies  Allergen Reactions  . Sulfonamide Derivatives Rash    REACTION: rash    Outpatient Encounter Prescriptions as of 05/04/2017  Medication Sig  . acetaminophen (TYLENOL) 325 MG tablet Take 650 mg by mouth every 4 (four) hours as needed.   Marland Kitchen apixaban (ELIQUIS) 5 MG TABS tablet Take 1 tablet (5 mg total) by mouth 2 (two) times daily.  . bisacodyl (DULCOLAX) 10 MG suppository Place 10 mg rectally every 12 (twelve) hours as needed for moderate constipation.  . carbamide peroxide (DEBROX) 6.5 % otic solution 5 drops. 5 drops to each ear at bedtime on the 21st of each month  . furosemide (LASIX) 20 MG tablet Take 20 mg by mouth daily.  . metoprolol tartrate (LOPRESSOR) 25 MG tablet Take 25 mg by mouth 2 (two) times daily.  Vladimir Faster Glycol-Propyl Glycol (SYSTANE) 0.4-0.3 % SOLN Apply to eye. One drop both eye four times daily  . triamcinolone cream (KENALOG) 0.1 % Apply 1 application topically. Apply to area of itching on left and right scapula daily as needed  . [DISCONTINUED] polyethylene glycol powder (MIRALAX) powder Take 1 Container by mouth once. Mix 17 gr of 4 oz of water take every other day   No facility-administered encounter medications on file as of 05/04/2017.     Review of Systems  Constitutional: Negative for chills and fever.  HENT: Positive for facial swelling and hearing loss. Negative for congestion, ear discharge, ear pain and nosebleeds.   Eyes: Negative for photophobia, pain, discharge, redness and itching.       Crusted and greasy eye lashes, redness of the right eyelid, R>L  Respiratory: Negative for cough, shortness of breath and wheezing.   Cardiovascular:  Positive for leg swelling. Negative for chest pain and palpitations.       Mainly in the left leg  Gastrointestinal: Positive for constipation. Negative for abdominal pain, nausea and vomiting.  Genitourinary: Positive for frequency. Negative for dysuria and urgency.  Musculoskeletal: Negative for back pain, myalgias and neck pain.       Flexion contractures of the right elbow and right wrist.  Skin: Negative for rash.       Dry scaly skin. Top of left head skin lesion 2x2x1cm rough surface.   Allergic/Immunologic: Negative for environmental allergies.  Neurological: Negative for dizziness, tremors, seizures, weakness and headaches.       Hx. CVA. Right hemiplegia.  Hematological: Bruises/bleeds easily.       Hx anemia  Psychiatric/Behavioral: Negative for hallucinations and suicidal ideas. The patient is nervous/anxious.  Immunization History  Administered Date(s) Administered  . Influenza Whole 10/06/2010, 08/07/2011, 09/20/2012  . Influenza-Unspecified 09/05/2013, 10/09/2014, 08/26/2015, 09/21/2016  . PPD Test 08/13/2013  . Pneumococcal Polysaccharide-23 05/06/1997  . Td 03/30/2005  . Tdap 05/29/2013   Pertinent  Health Maintenance Due  Topic Date Due  . PNA vac Low Risk Adult (2 of 2 - PCV13) 05/06/1998  . INFLUENZA VACCINE  07/06/2017   No flowsheet data found. Functional Status Survey:    Vitals:   05/04/17 1410  BP: 120/60  Pulse: 70  Resp: 18  Temp: 97.4 F (36.3 C)  Weight: 151 lb 14.4 oz (68.9 kg)  Height: 5\' 3"  (1.6 m)   Body mass index is 26.91 kg/m. Physical Exam  Constitutional: He appears well-developed and well-nourished. No distress.  HENT:  Head: Normocephalic and atraumatic.  Right Ear: External ear normal.  Left Ear: External ear normal.  Nose: Nose normal.  Mouth/Throat: No oropharyngeal exudate.  Eyes: Pupils are equal, round, and reactive to light. Right eye exhibits no discharge. No scleral icterus.  Left eye blindness. The right lower  eyelid ectropion. Crusted and greasy eye lashes, redness of the right eyelid, R>L   Neck: Normal range of motion. Neck supple. No JVD present. No tracheal deviation present. No thyromegaly present.  Cardiovascular: Normal rate and normal heart sounds.  An irregular rhythm present.  Pulmonary/Chest: Effort normal and breath sounds normal. No respiratory distress. He has no wheezes. He has no rales. He exhibits no tenderness.  Abdominal: He exhibits no distension. There is no tenderness.  Musculoskeletal: Normal range of motion. He exhibits edema. He exhibits no tenderness.  Right sided weakness since previous CVA  Right wrist and hand contractures-able to make fist and cannot extend fully. No longer ambulates with walker. Using wheelchair for mobility  1-2+pedal edema L>R  Neurological: He is alert. He displays abnormal reflex. No cranial nerve deficit. Coordination normal.  the left sided decreased coordination-cannot transfer self independently and dropped his utensil, grip strength is 3-4/5. Hx of right sided weakness with R hand contracture. Left facial weakness.  Slurred speech.  Skin: Skin is warm and dry. No rash noted. He is not diaphoretic. There is erythema. No pallor.  Right cholecystostomy surgical scar.  Medial left lower leg surgical scar from previous melanoma removal.   Psychiatric: Thought content normal. His mood appears anxious. His affect is not angry and not inappropriate. His speech is not rapid and/or pressured, not delayed and not slurred. He is agitated. He is not aggressive, not hyperactive, not slowed and not withdrawn. Cognition and memory are impaired. He expresses impulsivity and inappropriate judgment. He does not exhibit a depressed mood. He exhibits abnormal recent memory. He exhibits normal remote memory.    Labs reviewed:  Recent Labs  11/09/16 03/22/17 04/14/17 04/21/17  NA 139 136* 139  --   K 4.6 5.1  --  4.6  BUN 28* 30*  --  32*  CREATININE 1.0 1.0  --   1.1    Recent Labs  03/22/17  AST 11*  ALT 6*  ALKPHOS 107    Recent Labs  09/28/16 11/09/16 03/22/17  WBC 6.6 6.7 7.0  HGB 11.8* 11.1* 11.7*  HCT 36* 35* 34*  PLT 233 240 238   Lab Results  Component Value Date   TSH 2.01 03/16/2016   Lab Results  Component Value Date   HGBA1C 5.9 03/16/2016   Lab Results  Component Value Date   CHOL 129 02/03/2014   HDL 45 02/03/2014  LDLCALC 64 02/03/2014   LDLDIRECT 139.4 04/26/2007   TRIG 99 02/03/2014   CHOLHDL 2.9 02/03/2014    Significant Diagnostic Results in last 30 days:  No results found.  Assessment/Plan Blepharitis of both eyes Crusted and greasy eyelashes, redness at the margin of the eyelids, R>L, not conjunctival injection, denied itching or pain or change of vision associated with. 1% erythromycin ophthalmic ointment 1cm ribbon to the lower conjunctival sac qhs. Observe.   Essential hypertension Controlled, continue Metoprolol 25mg  bid, 04/21/17 Na 139, K 4.6, Bun 32, creat 1.06   Atrial fibrillation (HCC) Atrial fibrillation remained rate controlled. Patient was placed on Eliquis 5mg  bid per neurology's recommendations. Continue Metoprolol 25mg  bid  GERD Stable. off Omeprazole 20mg -refusal.  Constipation stable, continue Biscolax 10mg  suppository q12h prn  Hemiparesis as late effect of cerebrovascular accident (CVA) ([place]) Right sided weakness with right wrist/hand contracture. Grip strength is still 5/5. Ambulates with walker and w/c to go further. New onset of decreased the left sided coordination 01/2014. C/o worsened in his right sided weakness and stiffness. Continue anticoagulation therapy.    Edema Trace to 1+, On and off, mainly in the LLE  Anemia 03/22/17 Hgb 11.7     Family/ staff Communication: SNF  Labs/tests ordered: none

## 2017-05-09 ENCOUNTER — Non-Acute Institutional Stay (SKILLED_NURSING_FACILITY): Payer: Medicare Other | Admitting: Nurse Practitioner

## 2017-05-09 ENCOUNTER — Encounter: Payer: Self-pay | Admitting: Nurse Practitioner

## 2017-05-09 DIAGNOSIS — H01004 Unspecified blepharitis left upper eyelid: Secondary | ICD-10-CM | POA: Diagnosis not present

## 2017-05-09 DIAGNOSIS — H01005 Unspecified blepharitis left lower eyelid: Secondary | ICD-10-CM

## 2017-05-09 DIAGNOSIS — R609 Edema, unspecified: Secondary | ICD-10-CM

## 2017-05-09 DIAGNOSIS — D500 Iron deficiency anemia secondary to blood loss (chronic): Secondary | ICD-10-CM | POA: Diagnosis not present

## 2017-05-09 DIAGNOSIS — H01001 Unspecified blepharitis right upper eyelid: Secondary | ICD-10-CM

## 2017-05-09 DIAGNOSIS — H0100B Unspecified blepharitis left eye, upper and lower eyelids: Secondary | ICD-10-CM

## 2017-05-09 DIAGNOSIS — I872 Venous insufficiency (chronic) (peripheral): Secondary | ICD-10-CM | POA: Diagnosis not present

## 2017-05-09 DIAGNOSIS — H01002 Unspecified blepharitis right lower eyelid: Secondary | ICD-10-CM

## 2017-05-09 DIAGNOSIS — H0100A Unspecified blepharitis right eye, upper and lower eyelids: Secondary | ICD-10-CM

## 2017-05-09 NOTE — Progress Notes (Signed)
Location:  [facility] Room Number: 84 Place of Service:  SNF ([phone]) Provider:  Jackston Oaxaca, Manxie  NP  Estill Dooms, MD  Patient Care Team: Estill Dooms, MD as PCP - General (Internal Medicine) Kathrynn Ducking, MD as Consulting Physician (Neurology) Fanny Skates, MD as Consulting Physician (General Surgery) Irene Shipper, MD as Consulting Physician (Gastroenterology) Nahser, Wonda Cheng, MD as Consulting Physician (Cardiology) Tanda Rockers, MD as Consulting Physician (Pulmonary Disease) Danette Weinfeld X, NP as Nurse Practitioner (Internal Medicine)  Extended Emergency Contact Information Primary Emergency Contact: Staib,Williams (Rod) Address: [address]          [place], [place] [address] [place] Phone: [phone] Relation: Son  Code Status:  DNR Goals of care: Advanced Directive information Advanced Directives 05/09/2017  Does Patient Have a Medical Advance Directive? Yes  Type of Advance Directive Living will;Out of facility DNR (pink MOST or yellow form)  Does patient want to make changes to medical advance directive? No - Patient declined  Copy of [place] in Chart? -  Pre-existing out of facility DNR order (yellow form or pink MOST form) Yellow form placed in chart (order not valid for inpatient use)     Chief Complaint  Patient presents with  . Acute Visit    (L) lower leg -> red, warm to touch, slightly edematous, itchy.    HPI:  Pt is a 83 y.o. male seen today for an acute visit for flare up left lower leg mild warmth, redness, swelling, Hx of LLE venous insufficiency. No open wounds, denied increased pain.    Hx of late effect of CVAs with R+L sided weakness, R>L, right hand contracture, left leg swelling, w/c for mobility, heart rate controlled A-fib, taking Eliquis for thromboembolic risk reduction, normalized HTN while on Metoprolol 25mg  bid.    Past Medical History:  Diagnosis Date    . Abnormality of gait 05/16/2013  . Actinic keratosis   . Anemia, unspecified 07/05/2013  . Aortic valve stenosis   . 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 Bradycardia   . Bruises easily    due to coumadin  . Cancer ([place])   . Cerebrovascular disease   . Choledocholithiasis with obstruction 06/28/13  . Contact lens/glasses fitting   . COPD (chronic obstructive pulmonary disease) ([place])   . 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 Diverticulosis   . Diverticulosis   . Dysphagia   . Elevated liver enzymes   . Embolic stroke ([place]) 03/06/01  . Fracture of femoral neck, right, closed ([place]) 3114  . Gait disturbance   . GERD (gastroesophageal reflux disease)   . Hearing loss    wears hearing aid - right side  . Herpes zoster 02/08/14   Left facial nerve distribution  . History of melanoma   . Hyperglycemia   . Hyperlipidemia   . Hypertension   . Leg swelling   . Long term (current) use of anticoagulants    PAF and embolic CVA  . Melanoma ([place]) 2009  . Obstructive sleep apnea of adult    uses cpap, pt does not know setting  . PAF (paroxysmal atrial fibrillation) ([place])   . Peripheral vascular disease ([place])   . Personal history of fall 05/29/2013  . Protein-calorie malnutrition, severe ([place])   . Right hemiparesis ([place])   . Seborrheic keratosis   . Stroke [facility]) 1999, 02/02/14  . Ventricular tachycardia (paroxysmal) ([place])   . Weakness    difficulty  walking  . Xerophthalmia 07/05/2013  . Xerophthalmia    Past Surgical History:  Procedure Laterality Date  . ERCP N/A 07/04/2013   Procedure: ENDOSCOPIC RETROGRADE CHOLANGIOPANCREATOGRAPHY (ERCP);  Surgeon: Irene Shipper, MD;  Location: [facility];  Service: Endoscopy;  Laterality: N/A;  . excision of melanoma  2009   left leg  . HERNIA REPAIR  1983  . JOINT REPLACEMENT  2012   r fem head fx  . lung benign area removed   1970's  . MELANOMA EXCISION     many melanoma removed in past  . MELANOMA EXCISION  10/11/2011   Procedure: MELANOMA EXCISION;  Surgeon:  Pedro Earls, MD;  Location: [facility];  Service: General;  Laterality: Left;  EXCISION melanoma left leg with full thickness skin grafting from left lower abdomen.  Marland Kitchen MELANOMA EXCISION  05/16/2012   Procedure: MELANOMA EXCISION;  Surgeon: Pedro Earls, MD;  Location: [facility];  Service: General;  Laterality: Left;  Nodule Removal of Melanoma on Left Shin    Allergies  Allergen Reactions  . Sulfonamide Derivatives Rash    REACTION: rash    Outpatient Encounter Prescriptions as of 05/09/2017  Medication Sig  . acetaminophen (TYLENOL) 325 MG tablet Take 650 mg by mouth every 4 (four) hours as needed.   Marland Kitchen apixaban (ELIQUIS) 5 MG TABS tablet Take 1 tablet (5 mg total) by mouth 2 (two) times daily.  . bisacodyl (DULCOLAX) 10 MG suppository Place 10 mg rectally every 12 (twelve) hours as needed for moderate constipation.  . carbamide peroxide (DEBROX) 6.5 % otic solution 5 drops. 5 drops to each ear at bedtime on the 21st of each month  . erythromycin with ethanol (EMGEL) 2 % gel Apply topically daily.  . furosemide (LASIX) 20 MG tablet Take 20 mg by mouth daily.  . metoprolol tartrate (LOPRESSOR) 25 MG tablet Take 25 mg by mouth 2 (two) times daily.  Vladimir Faster Glycol-Propyl Glycol (SYSTANE) 0.4-0.3 % GEL ophthalmic gel Place 1 application into both eyes 4 (four) times daily.  Marland Kitchen triamcinolone cream (KENALOG) 0.1 % Apply 1 application topically. Apply to area of itching on left and right scapula daily as needed  . [DISCONTINUED] Polyethyl Glycol-Propyl Glycol (SYSTANE) 0.4-0.3 % SOLN Apply to eye. One drop both eye four times daily   No facility-administered encounter medications on file as of 05/09/2017.     Review of Systems  Constitutional: Negative for chills and fever.  HENT: Positive for hearing loss. Negative for congestion, ear discharge, ear pain, facial swelling and nosebleeds.   Eyes: Negative for photophobia, pain, discharge, redness and itching.       Crusted and greasy eye  lashes, redness of the right eyelid, R>L, resolved.   Respiratory: Negative for cough, shortness of breath and wheezing.   Cardiovascular: Positive for leg swelling. Negative for chest pain and palpitations.       Mainly in the left leg  Gastrointestinal: Positive for constipation. Negative for abdominal pain, nausea and vomiting.  Genitourinary: Positive for frequency. Negative for dysuria and urgency.  Musculoskeletal: Negative for back pain, myalgias and neck pain.       Flexion contractures of the right elbow and right wrist.  Skin: Negative for rash.       Dry scaly skin. Top of left head skin lesion 2x2x1cm rough surface.  Chronic venous dermatitis LLE  Allergic/Immunologic: Negative for environmental allergies.  Neurological: Negative for dizziness, tremors, seizures, weakness and headaches.       Hx. CVA. Right  hemiplegia.  Hematological: Bruises/bleeds easily.       Hx anemia  Psychiatric/Behavioral: Negative for hallucinations and suicidal ideas. The patient is nervous/anxious.     Immunization History  Administered Date(s) Administered  . Influenza Whole 10/06/2010, 08/07/2011, 09/20/2012  . Influenza-Unspecified 09/05/2013, 10/09/2014, 08/26/2015, 09/21/2016  . PPD Test 08/13/2013  . Pneumococcal Polysaccharide-23 05/06/1997  . Td 03/30/2005  . Tdap 05/29/2013   Pertinent  Health Maintenance Due  Topic Date Due  . PNA vac Low Risk Adult (2 of 2 - PCV13) 05/06/1998  . INFLUENZA VACCINE  07/06/2017   No flowsheet data found. Functional Status Survey:    Vitals:   05/09/17 1337  BP: 118/64  Pulse: (!) 50  Resp: 16  Temp: (!) 96.3 F (35.7 C)  Weight: 153 lb 4.8 oz (69.5 kg)  Height: 5\' 3"  (1.6 m)   Body mass index is 27.16 kg/m. Physical Exam  Constitutional: He appears well-developed and well-nourished. No distress.  HENT:  Head: Normocephalic and atraumatic.  Right Ear: External ear normal.  Left Ear: External ear normal.  Nose: Nose normal.    Mouth/Throat: No oropharyngeal exudate.  Eyes: Pupils are equal, round, and reactive to light. Right eye exhibits no discharge. No scleral icterus.  Left eye blindness. The right lower eyelid ectropion.  Neck: Normal range of motion. Neck supple. No JVD present. No tracheal deviation present. No thyromegaly present.  Cardiovascular: Normal rate and normal heart sounds.  An irregular rhythm present.  Pulmonary/Chest: Effort normal and breath sounds normal. No respiratory distress. He has no wheezes. He has no rales. He exhibits no tenderness.  Abdominal: He exhibits no distension. There is no tenderness.  Musculoskeletal: Normal range of motion. He exhibits edema. He exhibits no tenderness.  Right sided weakness since previous CVA  Right wrist and hand contractures-able to make fist and cannot extend fully. No longer ambulates with walker. Using wheelchair for mobility  1-2+pedal edema L>R  Neurological: He is alert. He displays abnormal reflex. No cranial nerve deficit. Coordination normal.  the left sided decreased coordination-cannot transfer self independently and dropped his utensil, grip strength is 3-4/5. Hx of right sided weakness with R hand contracture. Left facial weakness.  Slurred speech.  Skin: Skin is warm and dry. No rash noted. He is not diaphoretic. There is erythema. No pallor.  Right cholecystostomy surgical scar.  Medial left lower leg surgical scar from previous melanoma removal.  Left lower leg mild fever, redness, swelling, Hx of venous insufficiency.   Psychiatric: Thought content normal. His mood appears anxious. His affect is not angry and not inappropriate. His speech is not rapid and/or pressured, not delayed and not slurred. He is agitated. He is not aggressive, not hyperactive, not slowed and not withdrawn. Cognition and memory are impaired. He expresses impulsivity and inappropriate judgment. He does not exhibit a depressed mood. He exhibits abnormal recent memory. He  exhibits normal remote memory.    Labs reviewed:  Recent Labs  11/09/16 03/22/17 04/14/17 04/21/17  NA 139 136* 139  --   K 4.6 5.1  --  4.6  BUN 28* 30*  --  32*  CREATININE 1.0 1.0  --  1.1    Recent Labs  03/22/17  AST 11*  ALT 6*  ALKPHOS 107    Recent Labs  09/28/16 11/09/16 03/22/17  WBC 6.6 6.7 7.0  HGB 11.8* 11.1* 11.7*  HCT 36* 35* 34*  PLT 233 240 238   Lab Results  Component Value Date   TSH 2.01  03/16/2016   Lab Results  Component Value Date   HGBA1C 5.9 03/16/2016   Lab Results  Component Value Date   CHOL 129 02/03/2014   HDL 45 02/03/2014   LDLCALC 64 02/03/2014   LDLDIRECT 139.4 04/26/2007   TRIG 99 02/03/2014   CHOLHDL 2.9 02/03/2014    Significant Diagnostic Results in last 30 days:  No results found.  Assessment/Plan Venous stasis dermatitis of left lower extremity flare up left lower leg mild warmth, redness, swelling, Hx of LLE venous insufficiency. No open wounds, denied increased pain.  Apply Mycolog II lotion bid to LLE, observe  Edema Trace to 1+, On and off, mainly in the LLE  Anemia 03/22/17 Hgb 11.7  Blepharitis of both eyes Resolved, change Erythromycin ophthalmic ointment to prn.      Family/ staff Communication: SNF  Labs/tests ordered:  none

## 2017-05-10 DIAGNOSIS — I872 Venous insufficiency (chronic) (peripheral): Secondary | ICD-10-CM | POA: Insufficient documentation

## 2017-05-10 NOTE — Assessment & Plan Note (Signed)
Resolved, change Erythromycin ophthalmic ointment to prn.

## 2017-05-10 NOTE — Assessment & Plan Note (Signed)
03/22/17 Hgb 11.7

## 2017-05-10 NOTE — Assessment & Plan Note (Signed)
Trace to 1+, On and off, mainly in the LLE

## 2017-05-10 NOTE — Assessment & Plan Note (Signed)
flare up left lower leg mild warmth, redness, swelling, Hx of LLE venous insufficiency. No open wounds, denied increased pain.  Apply Mycolog II lotion bid to LLE, observe

## 2017-05-23 ENCOUNTER — Encounter (HOSPITAL_COMMUNITY): Payer: Self-pay | Admitting: Emergency Medicine

## 2017-05-23 ENCOUNTER — Emergency Department (HOSPITAL_COMMUNITY)
Admission: EM | Admit: 2017-05-23 | Discharge: 2017-05-24 | Disposition: A | Discharge status: Home / Self Care | Payer: Medicare Other | Attending: Emergency Medicine | Admitting: Emergency Medicine

## 2017-05-23 DIAGNOSIS — I10 Essential (primary) hypertension: Secondary | ICD-10-CM | POA: Insufficient documentation

## 2017-05-23 DIAGNOSIS — J449 Chronic obstructive pulmonary disease, unspecified: Secondary | ICD-10-CM | POA: Insufficient documentation

## 2017-05-23 DIAGNOSIS — Z79899 Other long term (current) drug therapy: Secondary | ICD-10-CM | POA: Insufficient documentation

## 2017-05-23 DIAGNOSIS — Z8673 Personal history of transient ischemic attack (TIA), and cerebral infarction without residual deficits: Secondary | ICD-10-CM | POA: Diagnosis not present

## 2017-05-23 DIAGNOSIS — Z7901 Long term (current) use of anticoagulants: Secondary | ICD-10-CM | POA: Insufficient documentation

## 2017-05-23 DIAGNOSIS — R001 Bradycardia, unspecified: Secondary | ICD-10-CM | POA: Insufficient documentation

## 2017-05-23 DIAGNOSIS — Z87891 Personal history of nicotine dependence: Secondary | ICD-10-CM | POA: Insufficient documentation

## 2017-05-23 LAB — I-STAT CHEM 8, ED
BUN: 38 mg/dL — AB (ref 6–20)
CREATININE: 1.1 mg/dL (ref 0.61–1.24)
Calcium, Ion: 1.09 mmol/L — ABNORMAL LOW (ref 1.15–1.40)
Chloride: 105 mmol/L (ref 101–111)
GLUCOSE: 99 mg/dL (ref 65–99)
HEMATOCRIT: 34 % — AB (ref 39.0–52.0)
Hemoglobin: 11.6 g/dL — ABNORMAL LOW (ref 13.0–17.0)
Potassium: 5.3 mmol/L — ABNORMAL HIGH (ref 3.5–5.1)
Sodium: 141 mmol/L (ref 135–145)
TCO2: 28 mmol/L (ref 0–100)

## 2017-05-23 LAB — I-STAT TROPONIN, ED: Troponin i, poc: 0 ng/mL (ref 0.00–0.08)

## 2017-05-23 NOTE — ED Notes (Signed)
PTAR contacted 

## 2017-05-23 NOTE — ED Notes (Signed)
Rod (son and POA)- [phone]  Call with any updates

## 2017-05-23 NOTE — ED Notes (Signed)
istat chem8 recollected

## 2017-05-23 NOTE — ED Notes (Signed)
MD at bedside. 

## 2017-05-23 NOTE — ED Notes (Signed)
Patient has urinal placed between legs due to stating the need to urinate.

## 2017-05-23 NOTE — ED Provider Notes (Signed)
[facility] DEPT Provider Note   CSN: [id number] Arrival date & time: 05/23/17  1739     History   Chief Complaint Chief Complaint  Patient presents with  . Bradycardia    HPI [name] is a 83 y.o. male.  HPI  Patient is a 83 year old male with past medical history significant for bradycardia and A. fib, who presents to the emergency Department due to concerns for bradycardia. Patient currently living at a nursing facility (friend's home), states that a nurse at the facility checked his heart rate prior to dinner and he is bradycardic. Concerned about his heart rate, 44, sent to the emergency department. He is currently without any complaints. Denies recent falls or trauma. Denies chest pain, shortness of breath, syncope, lightheadedness, nausea, vomiting. Fevers or chills.  Past Medical History:  Diagnosis Date  . Abnormality of gait 05/16/2013  . Actinic keratosis   . Anemia, unspecified 07/05/2013  . Aortic valve stenosis   . 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 Bradycardia   . Bruises easily    due to coumadin  . Cancer ([place])   . Cerebrovascular disease   . Choledocholithiasis with obstruction 06/28/13  . Contact lens/glasses fitting   . COPD (chronic obstructive pulmonary disease) ([place])   . 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 Diverticulosis   . Diverticulosis   . Dysphagia   . Elevated liver enzymes   . Embolic stroke ([place]) 01/14/46  . Fracture of femoral neck, right, closed ([place]) 3114  . Gait disturbance   . GERD (gastroesophageal reflux disease)   . Hearing loss    wears hearing aid - right side  . Herpes zoster 02/08/14   Left facial nerve distribution  . History of melanoma   . Hyperglycemia   . Hyperlipidemia   . Hypertension   . Leg swelling   . Long term (current) use of anticoagulants    PAF and embolic CVA  . Melanoma ([place]) 2009  . Obstructive sleep apnea of adult    uses cpap, pt does not know setting  . PAF (paroxysmal atrial fibrillation) ([place])   .  Peripheral vascular disease ([place])   . Personal history of fall 05/29/2013  . Protein-calorie malnutrition, severe ([place])   . Right hemiparesis ([place])   . Seborrheic keratosis   . Stroke [facility]) 1999, 02/02/14  . Ventricular tachycardia (paroxysmal) ([place])   . Weakness    difficulty walking  . Xerophthalmia 07/05/2013  . Xerophthalmia     Patient Active Problem List   Diagnosis Date Noted  . Venous stasis dermatitis of left lower extremity 05/10/2017  . Blepharitis of both eyes 09/27/2016  . Contracture of joint of forearm 11/21/2015  . Skin lesion of scalp 02/26/2015  . Ectropion of left lower eyelid 07/03/2014  . Osteoarthritis of hand, left 07/03/2014  . Dysphagia 02/18/2014  . Hyperglycemia   . Acute ischemic stroke ([place]) 02/04/2014  . TIA (transient ischemic attack) 02/03/2014  . Edema 12/20/2013  . Constipation 08/15/2013  . Anemia 07/05/2013  . Atrial fibrillation ([place]) 06/30/2013  . Hemiparesis as late effect of cerebrovascular accident (CVA) ([place]) 01/19/2011  . Long term current use of anticoagulant therapy 01/19/2011  . GERD 08/19/2008  . Essential hypertension 09/21/2007    Past Surgical History:  Procedure Laterality Date  . ERCP N/A 07/04/2013   Procedure: ENDOSCOPIC RETROGRADE CHOLANGIOPANCREATOGRAPHY (ERCP);  Surgeon: Irene Shipper, MD;  Location: [facility];  Service: Endoscopy;  Laterality: N/A;  . excision of melanoma  2009  left leg  . HERNIA REPAIR  1983  . JOINT REPLACEMENT  2012   r fem head fx  . lung benign area removed   1970's  . MELANOMA EXCISION     many melanoma removed in past  . MELANOMA EXCISION  10/11/2011   Procedure: MELANOMA EXCISION;  Surgeon: Pedro Earls, MD;  Location: [facility];  Service: General;  Laterality: Left;  EXCISION melanoma left leg with full thickness skin grafting from left lower abdomen.  Marland Kitchen MELANOMA EXCISION  05/16/2012   Procedure: MELANOMA EXCISION;  Surgeon: Pedro Earls, MD;  [facility];  Service: General;   Laterality: Left;  Nodule Removal of Melanoma on Left Shin       Home Medications    Prior to Admission medications   Medication Sig Start Date End Date Taking? Authorizing Provider  acetaminophen (TYLENOL) 325 MG tablet Take 650 mg by mouth every 4 (four) hours as needed.    Yes [provider]  apixaban (ELIQUIS) 5 MG TABS tablet Take 1 tablet (5 mg total) by mouth 2 (two) times daily. 02/06/14  Yes Delfina Redwood, MD  bisacodyl (DULCOLAX) 10 MG suppository Place 10 mg rectally every 12 (twelve) hours as needed for moderate constipation.   Yes [provider]  carbamide peroxide (DEBROX) 6.5 % otic solution 5 drops. 5 drops to each ear at bedtime on the 21st of each month   Yes [provider]  erythromycin ophthalmic ointment Place 1 application into both eyes as needed (conjunctivial).   Yes [provider]  furosemide (LASIX) 20 MG tablet Take 20 mg by mouth daily.   Yes [provider]  metoprolol tartrate (LOPRESSOR) 25 MG tablet Take 25 mg by mouth 2 (two) times daily.   Yes [provider]  Polyethyl Glycol-Propyl Glycol (SYSTANE) 0.4-0.3 % GEL ophthalmic gel Place 1 application into both eyes 4 (four) times daily.   Yes [provider]  triamcinolone cream (KENALOG) 0.1 % Apply 1 application topically. Apply to area of itching on left and right scapula daily as needed    [provider]    Family History Family History  Problem Relation Age of Onset  . Heart disease Father 32  . Cancer Son        prostate  . Dementia Sister        One sister has dementia    Social History Social History  Substance Use Topics  . Smoking status: Former Smoker    Types: Pipe    Quit date: 08/31/1964  . Smokeless tobacco: Never Used  . Alcohol use No     Allergies   Sulfonamide derivatives   Review of Systems Review of Systems  Constitutional: Negative for appetite change and fever.  HENT: Negative for  congestion.   Eyes: Negative for visual disturbance.  Respiratory: Negative for chest tightness and shortness of breath.   Cardiovascular: Negative for chest pain and palpitations.  Gastrointestinal: Negative for abdominal pain, blood in stool, nausea and vomiting.  Genitourinary: Negative for decreased urine volume and dysuria.  Musculoskeletal: Negative for back pain.  Skin: Negative for rash.  Neurological: Negative for dizziness, seizures, weakness, light-headedness and headaches.  Psychiatric/Behavioral: Negative for behavioral problems.     Physical Exam Updated Vital Signs BP (!) 154/84   Pulse (!) 56   Resp 16   SpO2 100%   Physical Exam  Constitutional: He is oriented to person, place, and time. He appears well-developed and well-nourished. No distress.  HENT:  Head: Atraumatic.  Mouth/Throat: Oropharynx is clear and moist.  Eyes: Conjunctivae and EOM are normal.  Neck: Normal range of motion.  Cardiovascular: Regular rhythm, normal heart sounds and intact distal pulses.   Bradycardia  Pulmonary/Chest: Effort normal and breath sounds normal. No respiratory distress.  Abdominal: Soft. There is no tenderness.  Musculoskeletal: Normal range of motion.  Neurological: He is alert and oriented to person, place, and time.  Contractions and atrophy to the right upper extremity (baseline from prior CVA)  Skin: Skin is warm.  Psychiatric: He has a normal mood and affect.     ED Treatments / Results  Labs (all labs ordered are listed, but only abnormal results are displayed) Labs Reviewed  I-STAT CHEM 8, ED - Abnormal; Notable for the following:       Result Value   Potassium 5.3 (*)    BUN 38 (*)    Calcium, Ion 1.09 (*)    Hemoglobin 11.6 (*)    HCT 34.0 (*)    All other components within normal limits  I-STAT TROPOININ, ED    EKG  EKG Interpretation  Date/Time:  Monday May 23 2017 19:00:29 EDT Ventricular Rate:  58 PR Interval:    QRS Duration: 132 QT  Interval:  412 QTC Calculation: 405 R Axis:   21 Text Interpretation:  Sinus rhythm Atrial premature complex LAE, consider biatrial enlargement Nonspecific intraventricular conduction delay Nonspecific repol abnormality, lateral leads ST depression slightly worse than previous but not new, no new ST elevation Confirmed by Merrily Pew [phone]) on 05/23/2017 7:14:32 PM       Radiology No results found.  Procedures Procedures (including critical care time)  Medications Ordered in ED Medications - No data to display   Initial Impression / Assessment and Plan / ED Course  I have reviewed the triage vital signs and the nursing notes.  Pertinent labs & imaging results that were available during my care of the patient were reviewed by me and considered in my medical decision making (see chart for details).     83 year old with past medical history significant for bradycardia, A. Fib, who presents to the emergency department for asymptomatic bradycardia. Heart rate on arrival 56. Normotensive. Patient currently without complaints. On chart review, heart rate varies between 45-70. Patient is on a beta blocker.  EKG shows sinus bradycardia, no signs of AV block. Nonspecific ST depression, probably worsening ST depression of V5. No significant ST elevation. Signs of atrial enlargement. No signs of acute ischemia. Troponin negative. Mild elevation of potassium 5.3. Do not feel that this elevation of potassium would cause bradycardia, no peaked T waves. Patient continues to be asymptomatic. Patient's family at bedside, states that he is at his normal mental status.  Patient stable for discharge home. Discussed follow-up with his primary care physician for further management of bradycardia. Given strict return precautions to the emergency department. Patient's family expressed understanding, no questions or concerns at time of discharge.  Final Clinical Impressions(s) / ED Diagnoses   Final  diagnoses:  Bradycardia    New Prescriptions New Prescriptions   No medications on file     Nathaniel Man, MD 05/24/17 Guillermina City, MD 05/25/17 [phone]

## 2017-05-23 NOTE — ED Triage Notes (Signed)
Per EMs:  Nurse at facility [facility]) stated she checked his HR prior to dinner and was concerned about current HR (44).  Hx of same.  Patient denies any symptoms at this time.  Pt currently on a beta blocker.

## 2017-05-24 ENCOUNTER — Non-Acute Institutional Stay (SKILLED_NURSING_FACILITY): Payer: Medicare Other | Admitting: Nurse Practitioner

## 2017-05-24 ENCOUNTER — Encounter: Payer: Self-pay | Admitting: Nurse Practitioner

## 2017-05-24 DIAGNOSIS — K5900 Constipation, unspecified: Secondary | ICD-10-CM | POA: Diagnosis not present

## 2017-05-24 DIAGNOSIS — I480 Paroxysmal atrial fibrillation: Secondary | ICD-10-CM | POA: Diagnosis not present

## 2017-05-24 DIAGNOSIS — R1310 Dysphagia, unspecified: Secondary | ICD-10-CM

## 2017-05-24 NOTE — Assessment & Plan Note (Signed)
constipation, but the patient declined laxative, he desires taking time and diet change unless no result from his effort.

## 2017-05-24 NOTE — ED Notes (Signed)
Pt asked when he would go home. Pt informed we are waiting on PTAR

## 2017-05-24 NOTE — Progress Notes (Signed)
Location:  [facility] Room Number: 61 Place of Service:  SNF (31) Provider:  Meghan Warshawsky, Manxie  NP  Blanchie Serve, MD  Patient Care Team: Blanchie Serve, MD as PCP - General (Internal Medicine) Kathrynn Ducking, MD as Consulting Physician (Neurology) Fanny Skates, MD as Consulting Physician (General Surgery) Irene Shipper, MD as Consulting Physician (Gastroenterology) Nahser, Wonda Cheng, MD as Consulting Physician (Cardiology) Tanda Rockers, MD as Consulting Physician (Pulmonary Disease) Saahas Hidrogo X, NP as Nurse Practitioner (Internal Medicine)  Extended Emergency Contact Information Primary Emergency Contact: Morss,Williams (Rod) Address: [address]          [place], [place] [address] [place] Phone: [phone] Relation: Son  Code Status:  DNR Goals of care: Advanced Directive information Advanced Directives 05/24/2017  Does Patient Have a Medical Advance Directive? Yes  Type of Advance Directive Out of facility DNR (pink MOST or yellow form)  Does patient want to make changes to medical advance directive? No - Patient declined  Copy of [place] in Chart? -  Pre-existing out of facility DNR order (yellow form or pink MOST form) Yellow form placed in chart (order not valid for inpatient use)     Chief Complaint  Patient presents with  . Acute Visit    ? constipation    HPI:  Pt is a 83 y.o. male seen today for an acute visit for constipation, but the patient declined laxative, he desires taking time and diet change unless no result from his effort.     ED eval 05/24/17 for bradycardia, EKG showed sinus rhythm, vent rate 43bpm, his baseline HR 40-70, asymptomatic.     Hx of late effect of CVAs with R+L sided weakness, R>L, right hand contracture, left leg swelling, w/c for mobility, heart rate controlled A-fib, taking Eliquis for thromboembolic risk reduction, normalized HTN while on Metoprolol 25mg   bid.    Past Medical History:  Diagnosis Date  . Abnormality of gait 05/16/2013  . Actinic keratosis   . Anemia, unspecified 07/05/2013  . Aortic valve stenosis   . 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 Bradycardia   . Bruises easily    due to coumadin  . Cancer ([place])   . Cerebrovascular disease   . Choledocholithiasis with obstruction 06/28/13  . Contact lens/glasses fitting   . COPD (chronic obstructive pulmonary disease) ([place])   . 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 Diverticulosis   . Diverticulosis   . Dysphagia   . Elevated liver enzymes   . Embolic stroke ([place]) 01/07/53  . Fracture of femoral neck, right, closed ([place]) 3114  . Gait disturbance   . GERD (gastroesophageal reflux disease)   . Hearing loss    wears hearing aid - right side  . Herpes zoster 02/08/14   Left facial nerve distribution  . History of melanoma   . Hyperglycemia   . Hyperlipidemia   . Hypertension   . Leg swelling   . Long term (current) use of anticoagulants    PAF and embolic CVA  . Melanoma ([place]) 2009  . Obstructive sleep apnea of adult    uses cpap, pt does not know setting  . PAF (paroxysmal atrial fibrillation) ([place])   . Peripheral vascular disease ([place])   . Personal history of fall 05/29/2013  . Protein-calorie malnutrition, severe ([place])   . Right hemiparesis ([place])   . Seborrheic keratosis   . Stroke [facility]) 1999, 02/02/14  . Ventricular tachycardia (paroxysmal) ([place])   .  Weakness    difficulty walking  . Xerophthalmia 07/05/2013  . Xerophthalmia    Past Surgical History:  Procedure Laterality Date  . ERCP N/A 07/04/2013   Procedure: ENDOSCOPIC RETROGRADE CHOLANGIOPANCREATOGRAPHY (ERCP);  Surgeon: Irene Shipper, MD;  Location: [facility];  Service: Endoscopy;  Laterality: N/A;  . excision of melanoma  2009   left leg  . HERNIA REPAIR  1983  . JOINT REPLACEMENT  2012   r fem head fx  . lung benign area removed   1970's  . MELANOMA EXCISION     many melanoma removed in past  . MELANOMA EXCISION   10/11/2011   Procedure: MELANOMA EXCISION;  Surgeon: Pedro Earls, MD;  Location: [facility];  Service: General;  Laterality: Left;  EXCISION melanoma left leg with full thickness skin grafting from left lower abdomen.  Marland Kitchen MELANOMA EXCISION  05/16/2012   Procedure: MELANOMA EXCISION;  Surgeon: Pedro Earls, MD;  Location: [facility];  Service: General;  Laterality: Left;  Nodule Removal of Melanoma on Left Shin    Allergies  Allergen Reactions  . Sulfonamide Derivatives Rash    REACTION: rash    Outpatient Encounter Prescriptions as of 05/24/2017  Medication Sig  . acetaminophen (TYLENOL) 325 MG tablet Take 650 mg by mouth every 4 (four) hours as needed.   Marland Kitchen apixaban (ELIQUIS) 5 MG TABS tablet Take 1 tablet (5 mg total) by mouth 2 (two) times daily.  . bisacodyl (DULCOLAX) 10 MG suppository Place 10 mg rectally every 12 (twelve) hours as needed for moderate constipation.  . carbamide peroxide (DEBROX) 6.5 % otic solution 5 drops. 5 drops to each ear at bedtime on the 21st of each month  . erythromycin ophthalmic ointment Place 1 application into both eyes as needed (conjunctivial).  . furosemide (LASIX) 20 MG tablet Take 20 mg by mouth daily.  Vladimir Faster Glycol-Propyl Glycol (SYSTANE) 0.4-0.3 % GEL ophthalmic gel Place 1 application into both eyes 4 (four) times daily.  . polyethylene glycol (MIRALAX / GLYCOLAX) packet Take 17 g by mouth daily.  Marland Kitchen triamcinolone cream (KENALOG) 0.1 % Apply 1 application topically. Apply to area of itching on left and right scapula daily as needed  . [DISCONTINUED] metoprolol tartrate (LOPRESSOR) 25 MG tablet Take 25 mg by mouth 2 (two) times daily.   No facility-administered encounter medications on file as of 05/24/2017.     Review of Systems  Constitutional: Negative for chills and fever.  HENT: Positive for hearing loss. Negative for congestion, ear discharge, ear pain, facial swelling and nosebleeds.   Eyes: Negative for photophobia, pain, discharge,  redness and itching.       Crusted and greasy eye lashes, redness of the right eyelid, R>L, resolved.   Respiratory: Negative for cough, shortness of breath and wheezing.   Cardiovascular: Positive for leg swelling. Negative for chest pain and palpitations.       Mainly in the left leg  Gastrointestinal: Positive for constipation. Negative for abdominal pain, nausea and vomiting.  Genitourinary: Positive for frequency. Negative for dysuria and urgency.  Musculoskeletal: Negative for back pain, myalgias and neck pain.       Flexion contractures of the right elbow and right wrist.  Skin: Negative for rash.       Dry scaly skin. Top of left head skin lesion 2x2x1cm rough surface.  Chronic venous dermatitis LLE  Allergic/Immunologic: Negative for environmental allergies.  Neurological: Negative for dizziness, tremors, seizures, weakness and headaches.       Hx.  CVA. Right hemiplegia.  Hematological: Bruises/bleeds easily.       Hx anemia  Psychiatric/Behavioral: Negative for hallucinations and suicidal ideas. The patient is nervous/anxious.     Immunization History  Administered Date(s) Administered  . Influenza Whole 10/06/2010, 08/07/2011, 09/20/2012  . Influenza-Unspecified 09/05/2013, 10/09/2014, 08/26/2015, 09/21/2016  . PPD Test 08/13/2013  . Pneumococcal Polysaccharide-23 05/06/1997  . Td 03/30/2005  . Tdap 05/29/2013   Pertinent  Health Maintenance Due  Topic Date Due  . PNA vac Low Risk Adult (2 of 2 - PCV13) 05/06/1998  . INFLUENZA VACCINE  07/06/2017   No flowsheet data found. Functional Status Survey:    Vitals:   05/24/17 1443  BP: 130/74  Pulse: 60  Resp: 16  Temp: 97 F (36.1 C)  Weight: 153 lb 8 oz (69.6 kg)  Height: 5\' 3"  (1.6 m)   Body mass index is 27.19 kg/m. Physical Exam  Constitutional: He appears well-developed and well-nourished. No distress.  HENT:  Head: Normocephalic and atraumatic.  Right Ear: External ear normal.  Left Ear: External ear  normal.  Nose: Nose normal.  Mouth/Throat: No oropharyngeal exudate.  Eyes: Pupils are equal, round, and reactive to light. Right eye exhibits no discharge. No scleral icterus.  Left eye blindness. The right lower eyelid ectropion.  Neck: Normal range of motion. Neck supple. No JVD present. No tracheal deviation present. No thyromegaly present.  Cardiovascular: Normal rate, regular rhythm and normal heart sounds.   Regular heart beats, HR in 50s upon my examination.   Pulmonary/Chest: Effort normal and breath sounds normal. No respiratory distress. He has no wheezes. He has no rales. He exhibits no tenderness.  Abdominal: He exhibits no distension. There is no tenderness.  Musculoskeletal: Normal range of motion. He exhibits edema. He exhibits no tenderness.  Right sided weakness since previous CVA  Right wrist and hand contractures-able to make fist and cannot extend fully. No longer ambulates with walker. Using wheelchair for mobility  1-2+pedal edema L>R  Neurological: He is alert. He displays abnormal reflex. No cranial nerve deficit. Coordination normal.  the left sided decreased coordination-cannot transfer self independently and dropped his utensil, grip strength is 3-4/5. Hx of right sided weakness with R hand contracture. Left facial weakness.  Slurred speech.  Skin: Skin is warm and dry. No rash noted. He is not diaphoretic. There is erythema. No pallor.  Right cholecystostomy surgical scar.  Medial left lower leg surgical scar from previous melanoma removal.  Left lower leg mild fever, redness, swelling, Hx of venous insufficiency.   Psychiatric: Thought content normal. His mood appears anxious. His affect is not angry and not inappropriate. His speech is not rapid and/or pressured, not delayed and not slurred. He is agitated. He is not aggressive, not hyperactive, not slowed and not withdrawn. Cognition and memory are impaired. He expresses impulsivity and inappropriate judgment. He  does not exhibit a depressed mood. He exhibits abnormal recent memory. He exhibits normal remote memory.    Labs reviewed:  Recent Labs  03/22/17 04/14/17 04/21/17 05/23/17 2157  NA 136* 139  --  141  K 5.1  --  4.6 5.3*  CL  --   --   --  105  GLUCOSE  --   --   --  99  BUN 30*  --  32* 38*  CREATININE 1.0  --  1.1 1.10    Recent Labs  03/22/17  AST 11*  ALT 6*  ALKPHOS 107    Recent Labs  09/28/16 11/09/16  03/22/17 05/23/17 2157  WBC 6.6 6.7 7.0  --   HGB 11.8* 11.1* 11.7* 11.6*  HCT 36* 35* 34* 34.0*  PLT 233 240 238  --    Lab Results  Component Value Date   TSH 2.01 03/16/2016   Lab Results  Component Value Date   HGBA1C 5.9 03/16/2016   Lab Results  Component Value Date   CHOL 129 02/03/2014   HDL 45 02/03/2014   LDLCALC 64 02/03/2014   LDLDIRECT 139.4 04/26/2007   TRIG 99 02/03/2014   CHOLHDL 2.9 02/03/2014    Significant Diagnostic Results in last 30 days:  No results found.  Assessment/Plan Constipation  constipation, but the patient declined laxative, he desires taking time and diet change unless no result from his effort.    Atrial fibrillation ([place]) ED eval 05/24/17 for bradycardia, EKG showed sinus rhythm, vent rate 43bpm, his baseline HR 40-70, asymptomatic. Will hold Metoprolol if HR is less than 60bpm   Dysphagia Diet modification, cough associated with eating and drinking, aspiration precaution.      Family/ staff Communication: SNF  Labs/tests ordered:  none

## 2017-05-24 NOTE — Assessment & Plan Note (Signed)
Diet modification, cough associated with eating and drinking, aspiration precaution.

## 2017-05-24 NOTE — Assessment & Plan Note (Signed)
ED eval 05/24/17 for bradycardia, EKG showed sinus rhythm, vent rate 43bpm, his baseline HR 40-70, asymptomatic. Will hold Metoprolol if HR is less than 60bpm

## 2017-05-24 NOTE — ED Notes (Signed)
PTAR here for pt. Pt unable to sign.

## 2017-06-05 DEATH — deceased
